# Patient Record
Sex: Male | Born: 1935
Health system: Southern US, Community
[De-identification: ages and names within clinical notes are randomized; demographics above are authoritative.]

## PROBLEM LIST (undated history)

## (undated) DIAGNOSIS — N183 Chronic kidney disease, stage 3 unspecified: Secondary | ICD-10-CM

## (undated) DIAGNOSIS — M545 Low back pain, unspecified: Secondary | ICD-10-CM

## (undated) DIAGNOSIS — C4490 Unspecified malignant neoplasm of skin, unspecified: Secondary | ICD-10-CM

## (undated) DIAGNOSIS — D649 Anemia, unspecified: Secondary | ICD-10-CM

## (undated) DIAGNOSIS — I350 Nonrheumatic aortic (valve) stenosis: Secondary | ICD-10-CM

## (undated) DIAGNOSIS — R51 Headache: Secondary | ICD-10-CM

## (undated) DIAGNOSIS — E669 Obesity, unspecified: Secondary | ICD-10-CM

## (undated) DIAGNOSIS — N184 Chronic kidney disease, stage 4 (severe): Secondary | ICD-10-CM

## (undated) DIAGNOSIS — F419 Anxiety disorder, unspecified: Secondary | ICD-10-CM

## (undated) DIAGNOSIS — R55 Syncope and collapse: Secondary | ICD-10-CM

## (undated) DIAGNOSIS — G8929 Other chronic pain: Secondary | ICD-10-CM

## (undated) DIAGNOSIS — Z9861 Coronary angioplasty status: Secondary | ICD-10-CM

## (undated) DIAGNOSIS — K529 Noninfective gastroenteritis and colitis, unspecified: Secondary | ICD-10-CM

## (undated) DIAGNOSIS — Z7901 Long term (current) use of anticoagulants: Secondary | ICD-10-CM

## (undated) DIAGNOSIS — I480 Paroxysmal atrial fibrillation: Secondary | ICD-10-CM

## (undated) DIAGNOSIS — I2510 Atherosclerotic heart disease of native coronary artery without angina pectoris: Secondary | ICD-10-CM

## (undated) DIAGNOSIS — E785 Hyperlipidemia, unspecified: Secondary | ICD-10-CM

## (undated) DIAGNOSIS — G43909 Migraine, unspecified, not intractable, without status migrainosus: Secondary | ICD-10-CM

## (undated) DIAGNOSIS — I779 Disorder of arteries and arterioles, unspecified: Secondary | ICD-10-CM

## (undated) DIAGNOSIS — I10 Essential (primary) hypertension: Secondary | ICD-10-CM

## (undated) DIAGNOSIS — R0600 Dyspnea, unspecified: Secondary | ICD-10-CM

## (undated) DIAGNOSIS — C61 Malignant neoplasm of prostate: Secondary | ICD-10-CM

## (undated) DIAGNOSIS — I519 Heart disease, unspecified: Secondary | ICD-10-CM

## (undated) DIAGNOSIS — M1990 Unspecified osteoarthritis, unspecified site: Secondary | ICD-10-CM

## (undated) DIAGNOSIS — I739 Peripheral vascular disease, unspecified: Secondary | ICD-10-CM

## (undated) HISTORY — PX: TRANSTHORACIC ECHOCARDIOGRAM: SHX275

## (undated) HISTORY — PX: COLONOSCOPY: SHX174

## (undated) HISTORY — DX: Nonrheumatic aortic (valve) stenosis: I35.0

## (undated) HISTORY — PX: CORONARY ANGIOPLASTY: SHX604

## (undated) HISTORY — PX: APPENDECTOMY: SHX54

## (undated) HISTORY — DX: Chronic kidney disease, stage 4 (severe): N18.4

## (undated) HISTORY — DX: Heart disease, unspecified: I51.9

## (undated) HISTORY — DX: Obesity, unspecified: E66.9

## (undated) HISTORY — DX: Chronic kidney disease, stage 3 (moderate): N18.3

## (undated) HISTORY — PX: KNEE ARTHROSCOPY: SHX127

## (undated) HISTORY — DX: Paroxysmal atrial fibrillation: I48.0

## (undated) HISTORY — DX: Coronary angioplasty status: Z98.61

## (undated) HISTORY — DX: Hyperlipidemia, unspecified: E78.5

## (undated) HISTORY — DX: Essential (primary) hypertension: I10

## (undated) HISTORY — PX: TONSILLECTOMY AND ADENOIDECTOMY: SUR1326

## (undated) HISTORY — DX: Disorder of arteries and arterioles, unspecified: I77.9

## (undated) HISTORY — DX: Chronic kidney disease, stage 3 unspecified: N18.30

## (undated) HISTORY — DX: Atherosclerotic heart disease of native coronary artery without angina pectoris: I25.10

## (undated) HISTORY — DX: Syncope and collapse: R55

## (undated) HISTORY — DX: Peripheral vascular disease, unspecified: I73.9

## (undated) HISTORY — DX: Long term (current) use of anticoagulants: Z79.01

## (undated) HISTORY — PX: CATARACT EXTRACTION W/ INTRAOCULAR LENS  IMPLANT, BILATERAL: SHX1307

---

## 2002-05-22 ENCOUNTER — Encounter: Payer: Self-pay | Admitting: Gastroenterology

## 2003-10-28 ENCOUNTER — Ambulatory Visit (HOSPITAL_COMMUNITY): Admission: RE | Admit: 2003-10-28 | Discharge: 2003-10-28 | Payer: Self-pay | Admitting: Sports Medicine

## 2003-10-31 ENCOUNTER — Ambulatory Visit (HOSPITAL_COMMUNITY): Admission: RE | Admit: 2003-10-31 | Discharge: 2003-10-31 | Payer: Self-pay | Admitting: Family Medicine

## 2006-10-12 ENCOUNTER — Ambulatory Visit (HOSPITAL_COMMUNITY): Admission: RE | Admit: 2006-10-12 | Discharge: 2006-10-12 | Payer: Self-pay | Admitting: Ophthalmology

## 2007-03-14 ENCOUNTER — Ambulatory Visit: Payer: Self-pay | Admitting: Hematology & Oncology

## 2007-03-21 ENCOUNTER — Ambulatory Visit: Payer: Self-pay | Admitting: Surgery

## 2007-04-05 ENCOUNTER — Ambulatory Visit: Admission: RE | Admit: 2007-04-05 | Discharge: 2007-05-10 | Payer: Self-pay | Admitting: Radiation Oncology

## 2007-04-08 LAB — CBC WITH DIFFERENTIAL/PLATELET
BASO%: 0.4 % (ref 0.0–2.0)
Basophils Absolute: 0.1 10*3/uL (ref 0.0–0.1)
EOS%: 1.4 % (ref 0.0–7.0)
Eosinophils Absolute: 0.2 10*3/uL (ref 0.0–0.5)
HCT: 40.5 % (ref 38.7–49.9)
HGB: 14.1 g/dL (ref 13.0–17.1)
LYMPH%: 14.2 % (ref 14.0–48.0)
MCH: 32.2 pg (ref 28.0–33.4)
MCHC: 35 g/dL (ref 32.0–35.9)
MCV: 92 fL (ref 81.6–98.0)
MONO#: 0.8 10*3/uL (ref 0.1–0.9)
MONO%: 6.2 % (ref 0.0–13.0)
NEUT#: 9.8 10*3/uL — ABNORMAL HIGH (ref 1.5–6.5)
NEUT%: 77.8 % — ABNORMAL HIGH (ref 40.0–75.0)
Platelets: 268 10*3/uL (ref 145–400)
RBC: 4.4 10*6/uL (ref 4.20–5.71)
RDW: 12.6 % (ref 11.2–14.6)
WBC: 12.6 10*3/uL — ABNORMAL HIGH (ref 4.0–10.0)
lymph#: 1.8 10*3/uL (ref 0.9–3.3)

## 2007-04-08 LAB — CHCC SMEAR

## 2007-04-11 HISTORY — PX: INSERTION PROSTATE RADIATION SEED: SUR718

## 2007-05-18 ENCOUNTER — Encounter: Admission: RE | Admit: 2007-05-18 | Discharge: 2007-05-18 | Payer: Self-pay | Admitting: Urology

## 2007-05-20 ENCOUNTER — Ambulatory Visit: Admission: RE | Admit: 2007-05-20 | Discharge: 2007-08-09 | Payer: Self-pay | Admitting: Radiation Oncology

## 2007-06-03 ENCOUNTER — Ambulatory Visit (HOSPITAL_BASED_OUTPATIENT_CLINIC_OR_DEPARTMENT_OTHER): Admission: RE | Admit: 2007-06-03 | Discharge: 2007-06-03 | Payer: Self-pay | Admitting: Urology

## 2008-10-18 ENCOUNTER — Encounter: Payer: Self-pay | Admitting: Gastroenterology

## 2008-10-22 DIAGNOSIS — I10 Essential (primary) hypertension: Secondary | ICD-10-CM

## 2008-10-22 DIAGNOSIS — G4730 Sleep apnea, unspecified: Secondary | ICD-10-CM | POA: Insufficient documentation

## 2008-10-22 DIAGNOSIS — M129 Arthropathy, unspecified: Secondary | ICD-10-CM | POA: Insufficient documentation

## 2008-10-22 DIAGNOSIS — Z86010 Personal history of colonic polyps: Secondary | ICD-10-CM

## 2008-10-22 DIAGNOSIS — Z8546 Personal history of malignant neoplasm of prostate: Secondary | ICD-10-CM | POA: Insufficient documentation

## 2008-10-22 DIAGNOSIS — K649 Unspecified hemorrhoids: Secondary | ICD-10-CM | POA: Insufficient documentation

## 2008-10-22 DIAGNOSIS — G4719 Other hypersomnia: Secondary | ICD-10-CM | POA: Insufficient documentation

## 2008-10-22 DIAGNOSIS — R3915 Urgency of urination: Secondary | ICD-10-CM

## 2008-10-22 HISTORY — DX: Essential (primary) hypertension: I10

## 2008-10-23 ENCOUNTER — Ambulatory Visit: Payer: Self-pay | Admitting: Gastroenterology

## 2008-10-23 DIAGNOSIS — D649 Anemia, unspecified: Secondary | ICD-10-CM

## 2008-10-23 DIAGNOSIS — D539 Nutritional anemia, unspecified: Secondary | ICD-10-CM | POA: Insufficient documentation

## 2008-10-23 DIAGNOSIS — H409 Unspecified glaucoma: Secondary | ICD-10-CM | POA: Insufficient documentation

## 2008-10-24 LAB — CONVERTED CEMR LAB
ALT: 21 units/L (ref 0–53)
AST: 28 units/L (ref 0–37)
Albumin: 3.6 g/dL (ref 3.5–5.2)
Alkaline Phosphatase: 55 units/L (ref 39–117)
BUN: 30 mg/dL — ABNORMAL HIGH (ref 6–23)
Basophils Absolute: 0 10*3/uL (ref 0.0–0.1)
Basophils Relative: 0 % (ref 0.0–3.0)
Bilirubin, Direct: 0 mg/dL (ref 0.0–0.3)
CO2: 31 meq/L (ref 19–32)
Calcium: 9.1 mg/dL (ref 8.4–10.5)
Chloride: 105 meq/L (ref 96–112)
Creatinine, Ser: 1.9 mg/dL — ABNORMAL HIGH (ref 0.4–1.5)
Eosinophils Absolute: 0.2 10*3/uL (ref 0.0–0.7)
Eosinophils Relative: 2.1 % (ref 0.0–5.0)
Ferritin: 160 ng/mL (ref 22.0–322.0)
Folate: >20 ng/mL
GFR calc non Af Amer: 36.79 mL/min (ref >60)
Glucose, Bld: 117 mg/dL — ABNORMAL HIGH (ref 70–99)
HCT: 40.1 % (ref 39.0–52.0)
Hemoglobin: 13.6 g/dL (ref 13.0–17.0)
Iron: 86 ug/dL (ref 42–165)
Lymphocytes Relative: 13.5 % (ref 12.0–46.0)
Lymphs Abs: 1.5 10*3/uL (ref 0.7–4.0)
MCHC: 33.8 g/dL (ref 30.0–36.0)
MCV: 94.3 fL (ref 78.0–100.0)
Monocytes Absolute: 0.6 10*3/uL (ref 0.1–1.0)
Monocytes Relative: 5.8 % (ref 3.0–12.0)
Neutro Abs: 8.7 10*3/uL — ABNORMAL HIGH (ref 1.4–7.7)
Neutrophils Relative %: 78.6 % — ABNORMAL HIGH (ref 43.0–77.0)
Platelets: 212 10*3/uL (ref 150.0–400.0)
Potassium: 4.9 meq/L (ref 3.5–5.1)
RBC: 4.25 M/uL (ref 4.22–5.81)
RDW: 12.2 % (ref 11.5–14.6)
Saturation Ratios: 29.7 % (ref 20.0–50.0)
Sodium: 142 meq/L (ref 135–145)
TSH: 2.19 microintl units/mL (ref 0.35–5.50)
Total Bilirubin: 0.9 mg/dL (ref 0.3–1.2)
Total Protein: 7 g/dL (ref 6.0–8.3)
Transferrin: 207.1 mg/dL — ABNORMAL LOW (ref 212.0–360.0)
Vitamin B-12: 526 pg/mL (ref 211–911)
WBC: 11 10*3/uL — ABNORMAL HIGH (ref 4.5–10.5)

## 2008-10-29 ENCOUNTER — Ambulatory Visit: Payer: Self-pay | Admitting: Gastroenterology

## 2009-04-23 ENCOUNTER — Inpatient Hospital Stay (HOSPITAL_COMMUNITY): Admission: RE | Admit: 2009-04-23 | Discharge: 2009-05-01 | Payer: Self-pay | Admitting: Specialist

## 2009-04-23 ENCOUNTER — Ambulatory Visit: Payer: Self-pay | Admitting: Internal Medicine

## 2009-05-03 ENCOUNTER — Emergency Department (HOSPITAL_COMMUNITY): Admission: EM | Admit: 2009-05-03 | Discharge: 2009-05-03 | Payer: Self-pay | Admitting: Emergency Medicine

## 2009-05-08 ENCOUNTER — Telehealth: Payer: Self-pay | Admitting: Gastroenterology

## 2009-05-09 ENCOUNTER — Ambulatory Visit: Payer: Self-pay | Admitting: Gastroenterology

## 2009-05-09 ENCOUNTER — Encounter: Payer: Self-pay | Admitting: Physician Assistant

## 2009-05-09 DIAGNOSIS — R197 Diarrhea, unspecified: Secondary | ICD-10-CM

## 2009-05-09 LAB — CONVERTED CEMR LAB
BUN: 20 mg/dL (ref 6–23)
Basophils Absolute: 0 10*3/uL (ref 0.0–0.1)
Basophils Relative: 0 % (ref 0.0–3.0)
CO2: 32 meq/L (ref 19–32)
Calcium: 8.8 mg/dL (ref 8.4–10.5)
Chloride: 103 meq/L (ref 96–112)
Creatinine, Ser: 1.5 mg/dL (ref 0.4–1.5)
Eosinophils Absolute: 0.3 10*3/uL (ref 0.0–0.7)
Eosinophils Relative: 2 % (ref 0.0–5.0)
GFR calc non Af Amer: 48.26 mL/min (ref >60)
Glucose, Bld: 98 mg/dL (ref 70–99)
HCT: 30.1 % — ABNORMAL LOW (ref 39.0–52.0)
Hemoglobin: 10.3 g/dL — ABNORMAL LOW (ref 13.0–17.0)
Lymphocytes Relative: 9.1 % — ABNORMAL LOW (ref 12.0–46.0)
Lymphs Abs: 1.3 10*3/uL (ref 0.7–4.0)
MCHC: 34.4 g/dL (ref 30.0–36.0)
MCV: 96.4 fL (ref 78.0–100.0)
Monocytes Absolute: 1.3 10*3/uL — ABNORMAL HIGH (ref 0.1–1.0)
Monocytes Relative: 9 % (ref 3.0–12.0)
Neutro Abs: 11.6 10*3/uL — ABNORMAL HIGH (ref 1.4–7.7)
Neutrophils Relative %: 79.9 % — ABNORMAL HIGH (ref 43.0–77.0)
Platelets: 458 10*3/uL — ABNORMAL HIGH (ref 150.0–400.0)
Potassium: 4.2 meq/L (ref 3.5–5.1)
RBC: 3.12 M/uL — ABNORMAL LOW (ref 4.22–5.81)
RDW: 12.5 % (ref 11.5–14.6)
Sodium: 141 meq/L (ref 135–145)
WBC: 14.5 10*3/uL — ABNORMAL HIGH (ref 4.5–10.5)

## 2009-05-10 HISTORY — PX: TOTAL KNEE ARTHROPLASTY: SHX125

## 2009-05-13 ENCOUNTER — Telehealth: Payer: Self-pay | Admitting: Physician Assistant

## 2009-05-17 ENCOUNTER — Encounter: Admission: RE | Admit: 2009-05-17 | Discharge: 2009-05-17 | Payer: Self-pay | Admitting: Family Medicine

## 2009-05-21 ENCOUNTER — Ambulatory Visit: Payer: Self-pay | Admitting: Gastroenterology

## 2009-05-21 DIAGNOSIS — R160 Hepatomegaly, not elsewhere classified: Secondary | ICD-10-CM

## 2009-05-21 DIAGNOSIS — R609 Edema, unspecified: Secondary | ICD-10-CM

## 2009-05-21 DIAGNOSIS — R197 Diarrhea, unspecified: Secondary | ICD-10-CM

## 2009-05-22 ENCOUNTER — Telehealth (INDEPENDENT_AMBULATORY_CARE_PROVIDER_SITE_OTHER): Payer: Self-pay | Admitting: *Deleted

## 2009-05-22 LAB — CONVERTED CEMR LAB
ALT: 30 units/L (ref 0–53)
AST: 30 units/L (ref 0–37)
Albumin: 3.7 g/dL (ref 3.5–5.2)
Alkaline Phosphatase: 61 units/L (ref 39–117)
Ammonia: 28 umol/L (ref 11–35)
BUN: 31 mg/dL — ABNORMAL HIGH (ref 6–23)
Basophils Absolute: 0 10*3/uL (ref 0.0–0.1)
Basophils Relative: 0 % (ref 0.0–3.0)
Bilirubin Urine: NEGATIVE
Bilirubin, Direct: 0.1 mg/dL (ref 0.0–0.3)
CO2: 25 meq/L (ref 19–32)
Calcium: 9.6 mg/dL (ref 8.4–10.5)
Chloride: 103 meq/L (ref 96–112)
Creatinine, Ser: 2 mg/dL — ABNORMAL HIGH (ref 0.4–1.5)
Eosinophils Absolute: 0.2 10*3/uL (ref 0.0–0.7)
Eosinophils Relative: 1.3 % (ref 0.0–5.0)
Ferritin: 203.4 ng/mL (ref 22.0–322.0)
Folate: >20 ng/mL
GFR calc non Af Amer: 34.63 mL/min (ref >60)
Glucose, Bld: 109 mg/dL — ABNORMAL HIGH (ref 70–99)
HCT: 35.6 % — ABNORMAL LOW (ref 39.0–52.0)
Hemoglobin, Urine: NEGATIVE
Hemoglobin: 12 g/dL — ABNORMAL LOW (ref 13.0–17.0)
INR: 1 (ref 0.8–1.0)
Iron: 56 ug/dL (ref 42–165)
Lymphocytes Relative: 11.9 % — ABNORMAL LOW (ref 12.0–46.0)
Lymphs Abs: 1.6 10*3/uL (ref 0.7–4.0)
MCHC: 33.8 g/dL (ref 30.0–36.0)
MCV: 96.3 fL (ref 78.0–100.0)
Monocytes Absolute: 1.1 10*3/uL — ABNORMAL HIGH (ref 0.1–1.0)
Monocytes Relative: 7.8 % (ref 3.0–12.0)
Neutro Abs: 10.6 10*3/uL — ABNORMAL HIGH (ref 1.4–7.7)
Neutrophils Relative %: 79 % — ABNORMAL HIGH (ref 43.0–77.0)
Nitrite: NEGATIVE
Platelets: 370 10*3/uL (ref 150.0–400.0)
Potassium: 5.7 meq/L — ABNORMAL HIGH (ref 3.5–5.1)
Prothrombin Time: 10.4 s (ref 9.1–11.7)
RBC: 3.69 M/uL — ABNORMAL LOW (ref 4.22–5.81)
RDW: 13.5 % (ref 11.5–14.6)
Saturation Ratios: 22.4 % (ref 20.0–50.0)
Sed Rate: 49 mm/hr — ABNORMAL HIGH (ref 0–22)
Sodium: 141 meq/L (ref 135–145)
Specific Gravity, Urine: 1.02 (ref 1.000–1.030)
TSH: 2.9 microintl units/mL (ref 0.35–5.50)
Total Bilirubin: 0.6 mg/dL (ref 0.3–1.2)
Total Protein, Urine: NEGATIVE mg/dL
Total Protein: 7.5 g/dL (ref 6.0–8.3)
Transferrin: 178.5 mg/dL — ABNORMAL LOW (ref 212.0–360.0)
Urine Glucose: NEGATIVE mg/dL
Urobilinogen, UA: 0.2 (ref 0.0–1.0)
Vitamin B-12: 653 pg/mL (ref 211–911)
WBC: 13.5 10*3/uL — ABNORMAL HIGH (ref 4.5–10.5)
pH: 5.5 (ref 5.0–8.0)

## 2009-05-23 ENCOUNTER — Telehealth (INDEPENDENT_AMBULATORY_CARE_PROVIDER_SITE_OTHER): Payer: Self-pay | Admitting: *Deleted

## 2009-05-23 ENCOUNTER — Ambulatory Visit: Payer: Self-pay | Admitting: Cardiology

## 2009-05-24 ENCOUNTER — Ambulatory Visit: Payer: Self-pay | Admitting: Gastroenterology

## 2009-05-24 LAB — CONVERTED CEMR LAB
BUN: 26 mg/dL — ABNORMAL HIGH (ref 6–23)
CO2: 28 meq/L (ref 19–32)
Calcium: 8.8 mg/dL (ref 8.4–10.5)
Chloride: 101 meq/L (ref 96–112)
Creatinine, Ser: 1.8 mg/dL — ABNORMAL HIGH (ref 0.4–1.5)
GFR calc non Af Amer: 39.1 mL/min (ref >60)
Glucose, Bld: 106 mg/dL — ABNORMAL HIGH (ref 70–99)
Potassium: 5.2 meq/L — ABNORMAL HIGH (ref 3.5–5.1)
Sodium: 137 meq/L (ref 135–145)

## 2009-06-06 ENCOUNTER — Encounter: Admission: RE | Admit: 2009-06-06 | Discharge: 2009-06-06 | Payer: Self-pay | Admitting: Emergency Medicine

## 2010-06-28 IMAGING — CR DG ABDOMEN ACUTE W/ 1V CHEST
4 series · 4 of 4 positions shown · non-contrast
Comparison: Abdominal films from 04/28/2009.

CLINICAL DATA: Shortness of breath and diarrhea.

ACUTE ABDOMEN SERIES (ABDOMEN 2 VIEW & CHEST 1 VIEW)

[w chest pa]
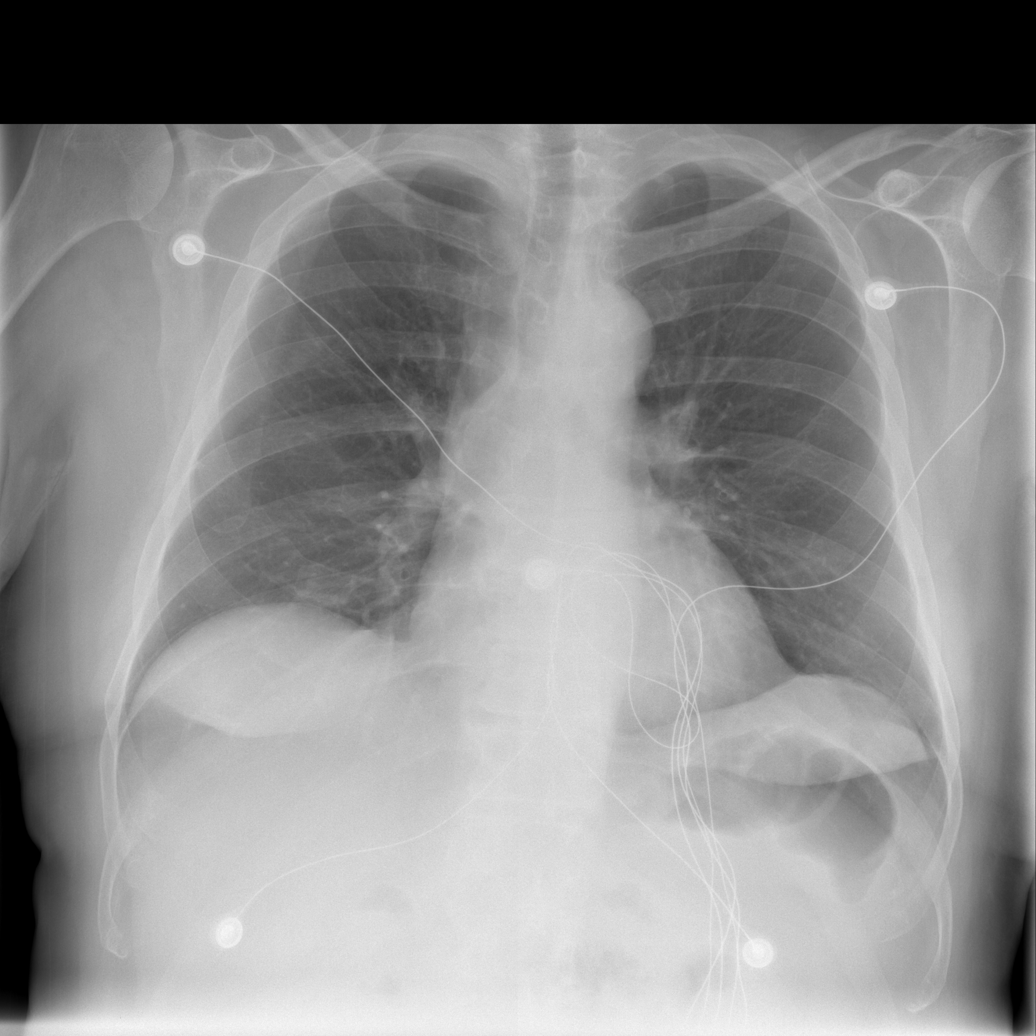

[w abdomen upright *]
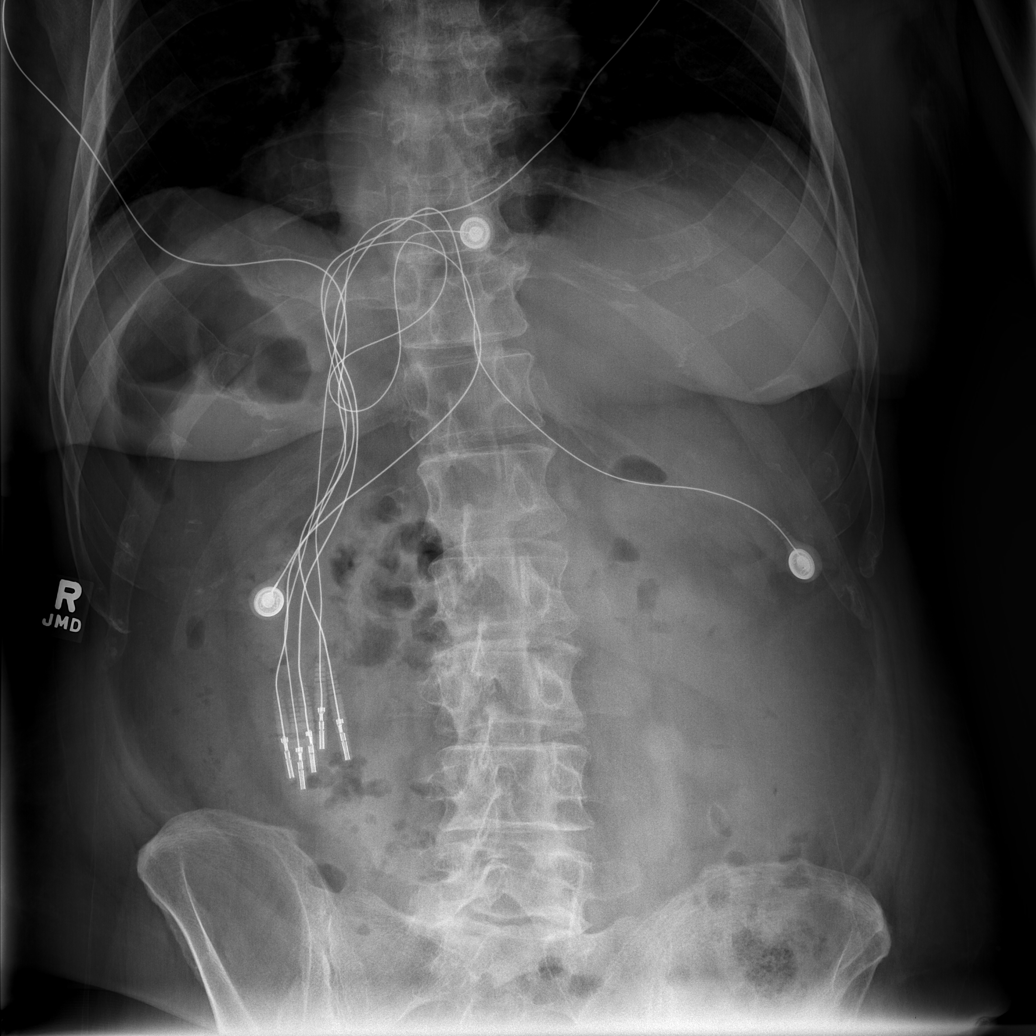

[t abdomen supine (1 of 2)]
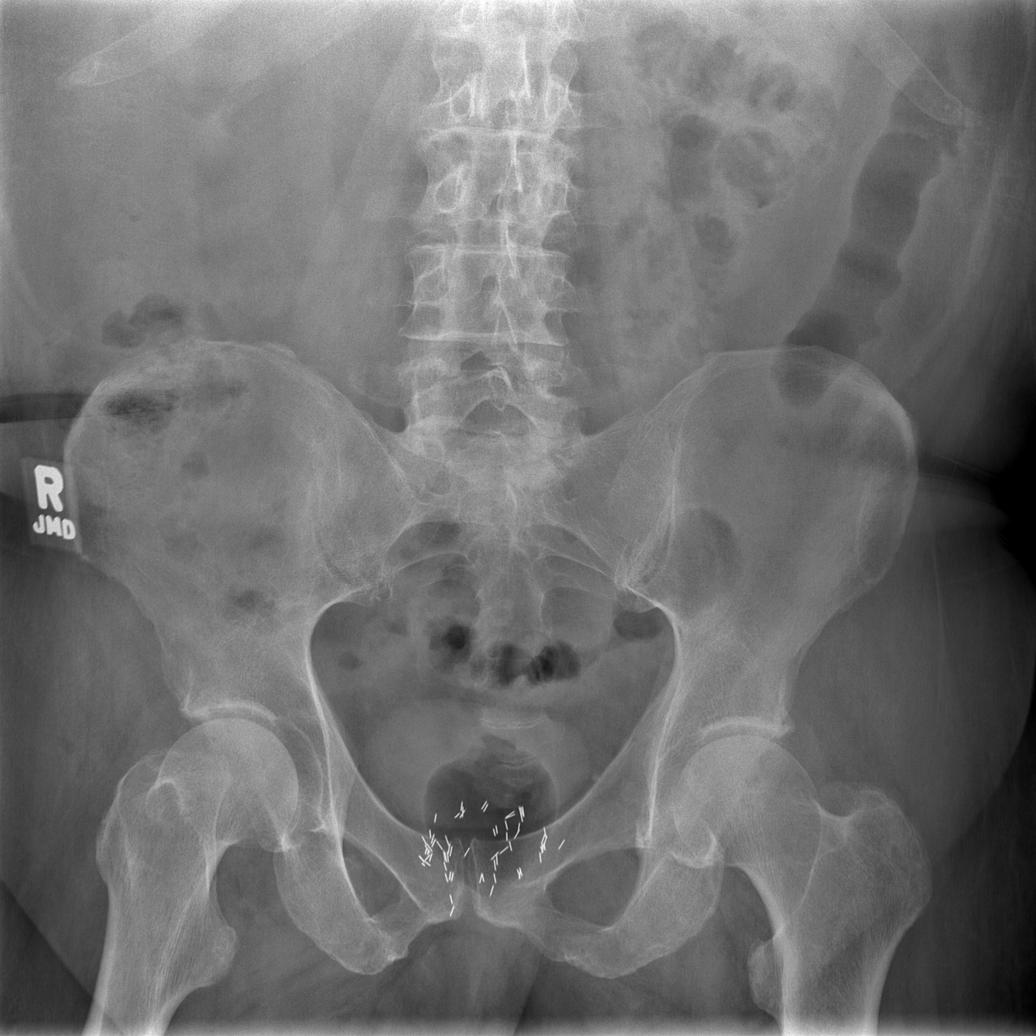

[t abdomen supine (2 of 2)]
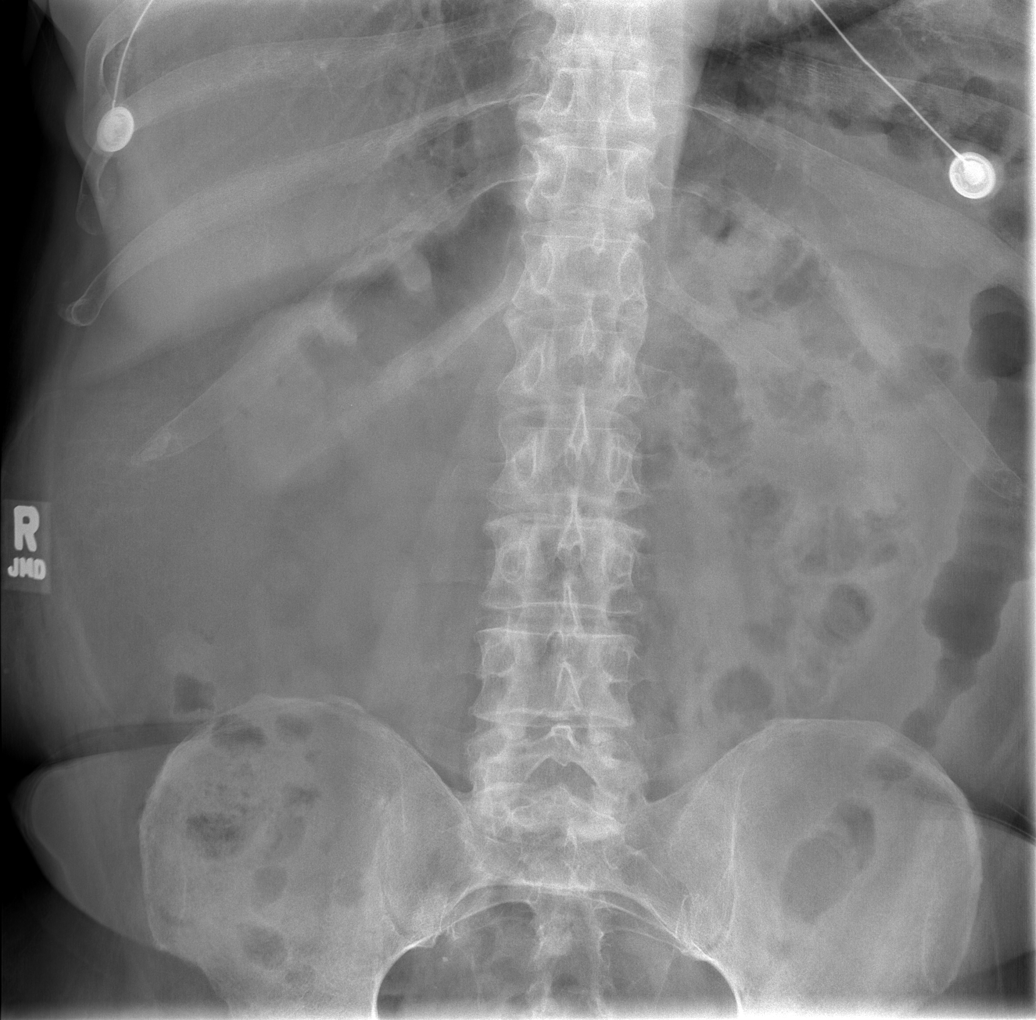

[4 of 4 positions shown; findings below may reference images not displayed]

FINDINGS: The lungs are clear without focal consolidation, edema,
effusion or pneumothorax.  Cardiopericardial silhouette is within
normal limits for size.  Imaged bony structures of the thorax are
intact.

Upright abdomen is without evidence of intraperitoneal free air.
There is no gaseous bowel dilatation to suggest obstruction.  There
is some air in the proximal transverse colon and descending colon.
Both these segments of colon appeared featureless, suggesting bowel
wall thickening.

Brachytherapy seeds project over the prostate gland.
IMPRESSION: No acute cardiopulmonary process.

Colonic wall thickening in the transverse and descending colon.
Infectious or inflammatory colitis could have this appearance.

## 2010-08-28 ENCOUNTER — Encounter: Payer: Self-pay | Admitting: Emergency Medicine

## 2010-08-28 ENCOUNTER — Ambulatory Visit (HOSPITAL_COMMUNITY)
Admission: RE | Admit: 2010-08-28 | Discharge: 2010-08-28 | Payer: Medicare Other | Source: Home / Self Care | Attending: Emergency Medicine | Admitting: Emergency Medicine

## 2010-08-28 DIAGNOSIS — I739 Peripheral vascular disease, unspecified: Secondary | ICD-10-CM

## 2010-08-28 DIAGNOSIS — I6529 Occlusion and stenosis of unspecified carotid artery: Secondary | ICD-10-CM

## 2010-08-31 ENCOUNTER — Encounter: Payer: Self-pay | Admitting: Family Medicine

## 2010-11-14 LAB — URINALYSIS, MICROSCOPIC ONLY
Bilirubin Urine: NEGATIVE
Glucose, UA: NEGATIVE mg/dL
Ketones, ur: NEGATIVE mg/dL
Leukocytes, UA: NEGATIVE
Nitrite: NEGATIVE
Protein, ur: NEGATIVE mg/dL
Specific Gravity, Urine: 1.02 (ref 1.005–1.030)
Urobilinogen, UA: 0.2 mg/dL (ref 0.0–1.0)
pH: 6 (ref 5.0–8.0)

## 2010-11-14 LAB — COMPREHENSIVE METABOLIC PANEL
ALT: 53 U/L (ref 0–53)
ALT: 19 U/L (ref 0–53)
AST: 57 U/L — ABNORMAL HIGH (ref 0–37)
AST: 25 U/L (ref 0–37)
Albumin: 2.6 g/dL — ABNORMAL LOW (ref 3.5–5.2)
Albumin: 3.5 g/dL (ref 3.5–5.2)
Alkaline Phosphatase: 56 U/L (ref 39–117)
Alkaline Phosphatase: 50 U/L (ref 39–117)
BUN: 20 mg/dL (ref 6–23)
BUN: 29 mg/dL — ABNORMAL HIGH (ref 6–23)
CO2: 28 mEq/L (ref 19–32)
CO2: 30 mEq/L (ref 19–32)
Calcium: 8.6 mg/dL (ref 8.4–10.5)
Calcium: 9.5 mg/dL (ref 8.4–10.5)
Chloride: 106 mEq/L (ref 96–112)
Chloride: 106 mEq/L (ref 96–112)
Creatinine, Ser: 1.41 mg/dL (ref 0.4–1.5)
Creatinine, Ser: 1.8 mg/dL — ABNORMAL HIGH (ref 0.4–1.5)
GFR calc Af Amer: 59 mL/min — ABNORMAL LOW (ref >60)
GFR calc Af Amer: 45 mL/min — ABNORMAL LOW (ref >60)
GFR calc non Af Amer: 49 mL/min — ABNORMAL LOW (ref >60)
GFR calc non Af Amer: 37 mL/min — ABNORMAL LOW (ref >60)
Glucose, Bld: 100 mg/dL — ABNORMAL HIGH (ref 70–99)
Glucose, Bld: 87 mg/dL (ref 70–99)
Potassium: 2.9 mEq/L — ABNORMAL LOW (ref 3.5–5.1)
Potassium: 5.2 mEq/L — ABNORMAL HIGH (ref 3.5–5.1)
Sodium: 142 mEq/L (ref 135–145)
Sodium: 141 mEq/L (ref 135–145)
Total Bilirubin: 0.3 mg/dL (ref 0.3–1.2)
Total Bilirubin: 0.5 mg/dL (ref 0.3–1.2)
Total Protein: 6 g/dL (ref 6.0–8.3)
Total Protein: 6.9 g/dL (ref 6.0–8.3)

## 2010-11-14 LAB — CBC
HCT: 30.6 % — ABNORMAL LOW (ref 39.0–52.0)
HCT: 31.2 % — ABNORMAL LOW (ref 39.0–52.0)
HCT: 31.8 % — ABNORMAL LOW (ref 39.0–52.0)
HCT: 32.2 % — ABNORMAL LOW (ref 39.0–52.0)
HCT: 32.5 % — ABNORMAL LOW (ref 39.0–52.0)
HCT: 33 % — ABNORMAL LOW (ref 39.0–52.0)
HCT: 34.2 % — ABNORMAL LOW (ref 39.0–52.0)
HCT: 33.7 % — ABNORMAL LOW (ref 39.0–52.0)
HCT: 40.3 % (ref 39.0–52.0)
Hemoglobin: 10.6 g/dL — ABNORMAL LOW (ref 13.0–17.0)
Hemoglobin: 10.6 g/dL — ABNORMAL LOW (ref 13.0–17.0)
Hemoglobin: 10.8 g/dL — ABNORMAL LOW (ref 13.0–17.0)
Hemoglobin: 11 g/dL — ABNORMAL LOW (ref 13.0–17.0)
Hemoglobin: 11.1 g/dL — ABNORMAL LOW (ref 13.0–17.0)
Hemoglobin: 11.4 g/dL — ABNORMAL LOW (ref 13.0–17.0)
Hemoglobin: 11.6 g/dL — ABNORMAL LOW (ref 13.0–17.0)
Hemoglobin: 11.6 g/dL — ABNORMAL LOW (ref 13.0–17.0)
Hemoglobin: 13.4 g/dL (ref 13.0–17.0)
MCHC: 33.5 g/dL (ref 30.0–36.0)
MCHC: 34 g/dL (ref 30.0–36.0)
MCHC: 34 g/dL (ref 30.0–36.0)
MCHC: 34 g/dL (ref 30.0–36.0)
MCHC: 34.5 g/dL (ref 30.0–36.0)
MCHC: 34.5 g/dL (ref 30.0–36.0)
MCHC: 34.7 g/dL (ref 30.0–36.0)
MCHC: 33.3 g/dL (ref 30.0–36.0)
MCHC: 34.4 g/dL (ref 30.0–36.0)
MCV: 95.8 fL (ref 78.0–100.0)
MCV: 96 fL (ref 78.0–100.0)
MCV: 96 fL (ref 78.0–100.0)
MCV: 96.1 fL (ref 78.0–100.0)
MCV: 96.1 fL (ref 78.0–100.0)
MCV: 96.4 fL (ref 78.0–100.0)
MCV: 97.3 fL (ref 78.0–100.0)
MCV: 96.5 fL (ref 78.0–100.0)
MCV: 96.8 fL (ref 78.0–100.0)
Platelets: 170 10*3/uL (ref 150–400)
Platelets: 180 10*3/uL (ref 150–400)
Platelets: 210 10*3/uL (ref 150–400)
Platelets: 212 10*3/uL (ref 150–400)
Platelets: 272 10*3/uL (ref 150–400)
Platelets: 284 10*3/uL (ref 150–400)
Platelets: 452 10*3/uL — ABNORMAL HIGH (ref 150–400)
Platelets: 201 10*3/uL (ref 150–400)
Platelets: 222 10*3/uL (ref 150–400)
RBC: 3.19 MIL/uL — ABNORMAL LOW (ref 4.22–5.81)
RBC: 3.24 MIL/uL — ABNORMAL LOW (ref 4.22–5.81)
RBC: 3.3 MIL/uL — ABNORMAL LOW (ref 4.22–5.81)
RBC: 3.36 MIL/uL — ABNORMAL LOW (ref 4.22–5.81)
RBC: 3.38 MIL/uL — ABNORMAL LOW (ref 4.22–5.81)
RBC: 3.44 MIL/uL — ABNORMAL LOW (ref 4.22–5.81)
RBC: 3.52 MIL/uL — ABNORMAL LOW (ref 4.22–5.81)
RBC: 3.49 MIL/uL — ABNORMAL LOW (ref 4.22–5.81)
RBC: 4.16 MIL/uL — ABNORMAL LOW (ref 4.22–5.81)
RDW: 11.9 % (ref 11.5–15.5)
RDW: 12.5 % (ref 11.5–15.5)
RDW: 12.8 % (ref 11.5–15.5)
RDW: 12.8 % (ref 11.5–15.5)
RDW: 12.9 % (ref 11.5–15.5)
RDW: 13.1 % (ref 11.5–15.5)
RDW: 13.1 % (ref 11.5–15.5)
RDW: 12.8 % (ref 11.5–15.5)
RDW: 13.1 % (ref 11.5–15.5)
WBC: 12.2 10*3/uL — ABNORMAL HIGH (ref 4.0–10.5)
WBC: 12.6 10*3/uL — ABNORMAL HIGH (ref 4.0–10.5)
WBC: 12.9 10*3/uL — ABNORMAL HIGH (ref 4.0–10.5)
WBC: 13.1 10*3/uL — ABNORMAL HIGH (ref 4.0–10.5)
WBC: 15.2 10*3/uL — ABNORMAL HIGH (ref 4.0–10.5)
WBC: 16 10*3/uL — ABNORMAL HIGH (ref 4.0–10.5)
WBC: 17.6 10*3/uL — ABNORMAL HIGH (ref 4.0–10.5)
WBC: 11.1 10*3/uL — ABNORMAL HIGH (ref 4.0–10.5)
WBC: 14.9 10*3/uL — ABNORMAL HIGH (ref 4.0–10.5)

## 2010-11-14 LAB — CROSSMATCH
ABO/RH(D): O POS
Antibody Screen: NEGATIVE

## 2010-11-14 LAB — DIFFERENTIAL
Basophils Absolute: 0.2 10*3/uL — ABNORMAL HIGH (ref 0.0–0.1)
Basophils Absolute: 0.2 10*3/uL — ABNORMAL HIGH (ref 0.0–0.1)
Basophils Absolute: 0 10*3/uL (ref 0.0–0.1)
Basophils Relative: 1 % (ref 0–1)
Basophils Relative: 2 % — ABNORMAL HIGH (ref 0–1)
Basophils Relative: 0 % (ref 0–1)
Eosinophils Absolute: 0.2 10*3/uL (ref 0.0–0.7)
Eosinophils Absolute: 0.3 10*3/uL (ref 0.0–0.7)
Eosinophils Absolute: 0.2 10*3/uL (ref 0.0–0.7)
Eosinophils Relative: 1 % (ref 0–5)
Eosinophils Relative: 2 % (ref 0–5)
Eosinophils Relative: 2 % (ref 0–5)
Lymphocytes Relative: 12 % (ref 12–46)
Lymphocytes Relative: 7 % — ABNORMAL LOW (ref 12–46)
Lymphocytes Relative: 15 % (ref 12–46)
Lymphs Abs: 1.1 10*3/uL (ref 0.7–4.0)
Lymphs Abs: 1.5 10*3/uL (ref 0.7–4.0)
Lymphs Abs: 1.7 10*3/uL (ref 0.7–4.0)
Monocytes Absolute: 1 10*3/uL (ref 0.1–1.0)
Monocytes Absolute: 1.2 10*3/uL — ABNORMAL HIGH (ref 0.1–1.0)
Monocytes Absolute: 0.9 10*3/uL (ref 0.1–1.0)
Monocytes Relative: 8 % (ref 3–12)
Monocytes Relative: 8 % (ref 3–12)
Monocytes Relative: 8 % (ref 3–12)
Neutro Abs: 12.5 10*3/uL — ABNORMAL HIGH (ref 1.7–7.7)
Neutro Abs: 9.9 10*3/uL — ABNORMAL HIGH (ref 1.7–7.7)
Neutro Abs: 8.4 10*3/uL — ABNORMAL HIGH (ref 1.7–7.7)
Neutrophils Relative %: 77 % (ref 43–77)
Neutrophils Relative %: 83 % — ABNORMAL HIGH (ref 43–77)
Neutrophils Relative %: 76 % (ref 43–77)

## 2010-11-14 LAB — RENAL FUNCTION PANEL
Albumin: 2.2 g/dL — ABNORMAL LOW (ref 3.5–5.2)
Albumin: 2.3 g/dL — ABNORMAL LOW (ref 3.5–5.2)
Albumin: 2.3 g/dL — ABNORMAL LOW (ref 3.5–5.2)
BUN: 17 mg/dL (ref 6–23)
BUN: 17 mg/dL (ref 6–23)
BUN: 19 mg/dL (ref 6–23)
CO2: 23 mEq/L (ref 19–32)
CO2: 25 mEq/L (ref 19–32)
CO2: 26 mEq/L (ref 19–32)
Calcium: 7.9 mg/dL — ABNORMAL LOW (ref 8.4–10.5)
Calcium: 8 mg/dL — ABNORMAL LOW (ref 8.4–10.5)
Calcium: 8.1 mg/dL — ABNORMAL LOW (ref 8.4–10.5)
Chloride: 100 mEq/L (ref 96–112)
Chloride: 103 mEq/L (ref 96–112)
Chloride: 99 mEq/L (ref 96–112)
Creatinine, Ser: 1.3 mg/dL (ref 0.4–1.5)
Creatinine, Ser: 1.38 mg/dL (ref 0.4–1.5)
Creatinine, Ser: 1.39 mg/dL (ref 0.4–1.5)
GFR calc Af Amer: >60 mL/min (ref >60)
GFR calc Af Amer: >60 mL/min (ref >60)
GFR calc Af Amer: >60 mL/min (ref >60)
GFR calc non Af Amer: 50 mL/min — ABNORMAL LOW (ref >60)
GFR calc non Af Amer: 50 mL/min — ABNORMAL LOW (ref >60)
GFR calc non Af Amer: 54 mL/min — ABNORMAL LOW (ref >60)
Glucose, Bld: 110 mg/dL — ABNORMAL HIGH (ref 70–99)
Glucose, Bld: 124 mg/dL — ABNORMAL HIGH (ref 70–99)
Glucose, Bld: 137 mg/dL — ABNORMAL HIGH (ref 70–99)
Phosphorus: 2.4 mg/dL (ref 2.3–4.6)
Phosphorus: 2.7 mg/dL (ref 2.3–4.6)
Phosphorus: 2.9 mg/dL (ref 2.3–4.6)
Potassium: 3.1 mEq/L — ABNORMAL LOW (ref 3.5–5.1)
Potassium: 3.2 mEq/L — ABNORMAL LOW (ref 3.5–5.1)
Potassium: 3.6 mEq/L (ref 3.5–5.1)
Sodium: 131 mEq/L — ABNORMAL LOW (ref 135–145)
Sodium: 131 mEq/L — ABNORMAL LOW (ref 135–145)
Sodium: 133 mEq/L — ABNORMAL LOW (ref 135–145)

## 2010-11-14 LAB — URINALYSIS, ROUTINE W REFLEX MICROSCOPIC
Bilirubin Urine: NEGATIVE
Bilirubin Urine: NEGATIVE
Bilirubin Urine: NEGATIVE
Glucose, UA: NEGATIVE mg/dL
Glucose, UA: NEGATIVE mg/dL
Glucose, UA: NEGATIVE mg/dL
Hgb urine dipstick: NEGATIVE
Hgb urine dipstick: NEGATIVE
Hgb urine dipstick: NEGATIVE
Ketones, ur: NEGATIVE mg/dL
Ketones, ur: NEGATIVE mg/dL
Ketones, ur: NEGATIVE mg/dL
Nitrite: NEGATIVE
Nitrite: NEGATIVE
Nitrite: NEGATIVE
Protein, ur: NEGATIVE mg/dL
Protein, ur: NEGATIVE mg/dL
Protein, ur: NEGATIVE mg/dL
Specific Gravity, Urine: 1.014 (ref 1.005–1.030)
Specific Gravity, Urine: 1.018 (ref 1.005–1.030)
Specific Gravity, Urine: 1.018 (ref 1.005–1.030)
Urobilinogen, UA: 0.2 mg/dL (ref 0.0–1.0)
Urobilinogen, UA: 0.2 mg/dL (ref 0.0–1.0)
Urobilinogen, UA: 0.2 mg/dL (ref 0.0–1.0)
pH: 5.5 (ref 5.0–8.0)
pH: 6 (ref 5.0–8.0)
pH: 6.5 (ref 5.0–8.0)

## 2010-11-14 LAB — BASIC METABOLIC PANEL
BUN: 16 mg/dL (ref 6–23)
BUN: 18 mg/dL (ref 6–23)
BUN: 24 mg/dL — ABNORMAL HIGH (ref 6–23)
CO2: 24 mEq/L (ref 19–32)
CO2: 26 mEq/L (ref 19–32)
CO2: 28 mEq/L (ref 19–32)
Calcium: 7.9 mg/dL — ABNORMAL LOW (ref 8.4–10.5)
Calcium: 7.9 mg/dL — ABNORMAL LOW (ref 8.4–10.5)
Calcium: 8.1 mg/dL — ABNORMAL LOW (ref 8.4–10.5)
Chloride: 103 mEq/L (ref 96–112)
Chloride: 105 mEq/L (ref 96–112)
Chloride: 105 mEq/L (ref 96–112)
Creatinine, Ser: 1.23 mg/dL (ref 0.4–1.5)
Creatinine, Ser: 1.53 mg/dL — ABNORMAL HIGH (ref 0.4–1.5)
Creatinine, Ser: 1.75 mg/dL — ABNORMAL HIGH (ref 0.4–1.5)
GFR calc Af Amer: 54 mL/min — ABNORMAL LOW (ref >60)
GFR calc Af Amer: >60 mL/min (ref >60)
GFR calc Af Amer: 46 mL/min — ABNORMAL LOW (ref >60)
GFR calc non Af Amer: 44 mL/min — ABNORMAL LOW (ref >60)
GFR calc non Af Amer: 57 mL/min — ABNORMAL LOW (ref >60)
GFR calc non Af Amer: 38 mL/min — ABNORMAL LOW (ref >60)
Glucose, Bld: 109 mg/dL — ABNORMAL HIGH (ref 70–99)
Glucose, Bld: 166 mg/dL — ABNORMAL HIGH (ref 70–99)
Glucose, Bld: 134 mg/dL — ABNORMAL HIGH (ref 70–99)
Potassium: 3.2 mEq/L — ABNORMAL LOW (ref 3.5–5.1)
Potassium: 3.9 mEq/L (ref 3.5–5.1)
Potassium: 4.6 mEq/L (ref 3.5–5.1)
Sodium: 134 mEq/L — ABNORMAL LOW (ref 135–145)
Sodium: 134 mEq/L — ABNORMAL LOW (ref 135–145)
Sodium: 137 mEq/L (ref 135–145)

## 2010-11-14 LAB — ABO/RH: ABO/RH(D): O POS

## 2010-11-14 LAB — URINE CULTURE
Colony Count: 3000
Colony Count: NO GROWTH
Culture: NO GROWTH
Special Requests: NEGATIVE

## 2010-11-14 LAB — STOOL CULTURE

## 2010-11-14 LAB — HEMOCCULT GUIAC POC 1CARD (OFFICE): Fecal Occult Bld: NEGATIVE

## 2010-11-14 LAB — CLOSTRIDIUM DIFFICILE EIA: C difficile Toxins A+B, EIA: NEGATIVE

## 2010-11-14 LAB — OVA AND PARASITE EXAMINATION: Ova and parasites: NONE SEEN

## 2010-11-14 LAB — APTT
aPTT: 30 seconds (ref 24–37)
aPTT: 26 seconds (ref 24–37)

## 2010-11-14 LAB — PROTIME-INR
INR: 1 (ref 0.00–1.49)
INR: 1 (ref 0.00–1.49)
Prothrombin Time: 13.1 seconds (ref 11.6–15.2)
Prothrombin Time: 12.6 seconds (ref 11.6–15.2)

## 2010-12-20 ENCOUNTER — Emergency Department (HOSPITAL_COMMUNITY): Payer: Medicare Other

## 2010-12-20 ENCOUNTER — Emergency Department (HOSPITAL_COMMUNITY)
Admission: EM | Admit: 2010-12-20 | Discharge: 2010-12-20 | Disposition: A | Discharge status: Home / Self Care | Payer: Medicare Other | Attending: Emergency Medicine | Admitting: Emergency Medicine

## 2010-12-20 DIAGNOSIS — R55 Syncope and collapse: Secondary | ICD-10-CM | POA: Insufficient documentation

## 2010-12-20 DIAGNOSIS — R109 Unspecified abdominal pain: Secondary | ICD-10-CM | POA: Insufficient documentation

## 2010-12-20 DIAGNOSIS — I509 Heart failure, unspecified: Secondary | ICD-10-CM | POA: Insufficient documentation

## 2010-12-20 DIAGNOSIS — Z8546 Personal history of malignant neoplasm of prostate: Secondary | ICD-10-CM | POA: Insufficient documentation

## 2010-12-20 DIAGNOSIS — I10 Essential (primary) hypertension: Secondary | ICD-10-CM | POA: Insufficient documentation

## 2010-12-20 DIAGNOSIS — Z96659 Presence of unspecified artificial knee joint: Secondary | ICD-10-CM | POA: Insufficient documentation

## 2010-12-20 LAB — POCT CARDIAC MARKERS
CKMB, poc: 1.4 ng/mL (ref 1.0–8.0)
Myoglobin, poc: 228 ng/mL (ref 12–200)
Myoglobin, poc: 169 ng/mL (ref 12–200)
Troponin i, poc: <0.05 ng/mL (ref 0.00–0.09)

## 2010-12-20 LAB — COMPREHENSIVE METABOLIC PANEL
ALT: 17 U/L (ref 0–53)
BUN: 46 mg/dL — ABNORMAL HIGH (ref 6–23)
CO2: 26 mEq/L (ref 19–32)
Calcium: 8.7 mg/dL (ref 8.4–10.5)
Creatinine, Ser: 2.01 mg/dL — ABNORMAL HIGH (ref 0.4–1.5)
GFR calc non Af Amer: 32 mL/min — ABNORMAL LOW (ref >60)
Glucose, Bld: 99 mg/dL (ref 70–99)

## 2010-12-20 LAB — DIFFERENTIAL
Basophils Absolute: 0 10*3/uL (ref 0.0–0.1)
Basophils Relative: 0 % (ref 0–1)
Eosinophils Relative: 1 % (ref 0–5)
Monocytes Absolute: 1.3 10*3/uL — ABNORMAL HIGH (ref 0.1–1.0)

## 2010-12-20 LAB — URINALYSIS, ROUTINE W REFLEX MICROSCOPIC
Bilirubin Urine: NEGATIVE
Ketones, ur: NEGATIVE mg/dL
Nitrite: NEGATIVE
Specific Gravity, Urine: 1.021 (ref 1.005–1.030)
Urobilinogen, UA: 0.2 mg/dL (ref 0.0–1.0)

## 2010-12-20 LAB — GLUCOSE, CAPILLARY

## 2010-12-20 LAB — CBC
MCHC: 33.9 g/dL (ref 30.0–36.0)
Platelets: 238 10*3/uL (ref 150–400)
RDW: 12.6 % (ref 11.5–15.5)
WBC: 13.4 10*3/uL — ABNORMAL HIGH (ref 4.0–10.5)

## 2010-12-23 NOTE — Op Note (Signed)
Mark Jackson, Mark Jackson                ACCOUNT NO.:  [id number]   MEDICAL RECORD NO.:  [id number]          PATIENT TYPE:  AMB   LOCATION:  [facility]                         FACILITY:  [facility]   PHYSICIAN:  Ronald L. Rosana Hoes, M.D.  DATE OF BIRTH:  [birth year]   DATE OF PROCEDURE:  06/03/2007  DATE OF DISCHARGE:                               OPERATIVE REPORT   DIAGNOSIS:  Adenocarcinoma of the prostate.   OPERATIVE PROCEDURE:  Transperineal implantation of iodine-125 seeds  with the robotic arm Nucletron and cystourethroscopy.   SURGEON:  Tresa Endo, MD   ASSISTANT:  Tyler Pita, MD   ANESTHESIA:  General endotracheal.   ESTIMATED BLOOD LOSS:  Negligible.   TUBES:  A 16-French Foley catheter.   COMPLICATIONS:  None.  A total of 68 iodine-125 seeds were implanted  with 25 needles at 26.5200 total apparent activity.   INDICATIONS FOR PROCEDURE:  Mark Jackson is a very nice 77 year old white  male who presented with a fairly low PSA of 2.8, but he had a palpable  region abnormality in the left side of the prostate.  He subsequently  underwent ultrasound and biopsy of prostate, which revealed that 5% of  the biopsies on the right and left base had Gleason score 6, 3 + 3  adenocarcinoma and 10% of the biopsy of the left apex and 40% biopsy of  the right apex had a Gleason score 6, which 3+ 3 adenocarcinoma.  It was  felt that he was a clinical stage T2b.  After understanding the risks,  benefits and alternatives, he elected to undergo seed implantation.  He  has been properly simulated and properly informed.   PROCEDURE IN DETAIL:  The patient was placed in the supine position and  after proper general endotracheal anesthesia, he was placed in the  dorsal lithotomy position and prepped and draped with Betadine in a  sterile fashion.  A 16-French Foley catheter was inserted and the  bladder was drained and the patient was placed in the dorsal lithotomy  position and prepped.  The B&K  biplanar ultrasound probe was placed, the  prostate was localized and is documented in the records, dosimetry was  carefully planned so as to optimize the dose to the prostate and  minimize the dose to the urethra and the rectum.  Two holding needles  had been placed in unused coordinates and following this, an initial  base setting needle was placed and the initial seed implantation was  performed.  Serial implantation was then performed using the robotic arm  Actor.  After the needles had been placed, 25 needles were  utilized to implant 68 iodine-125 seeds with a total apparent activity  of 26.5200 mCi.  We were very comfortable with the seed location.  All  hardware was removed, except the Foley catheter.  Static images were  obtained fluoroscopically and again we were again comfortable with the  seed location.  The Foley catheter was removed and scanned for seeds and  there were none within it.  A flexible cystourethroscopy was then  performed.  He had mild  trilobar hypertrophy with grade 1 trabeculation of the bladder.  Efflux  of clear urine was noted from the normally placed ureteral orifices  bilaterally and there were no lesions in the bladder or the urethra.  A  new Foley catheter was placed and the patient was taken to the recovery  room, stable.      [place] Rosana Hoes, M.D.  Electronically Signed     RLD/MEDQ  D:  06/03/2007  T:  06/04/2007  Job:  [id number]

## 2010-12-23 NOTE — Procedures (Signed)
CAROTID DUPLEX EXAM   INDICATION:  Followup carotid artery disease per [facility].   HISTORY:  Diabetes:  No  Cardiac:  No  Hypertension:  Yes  Smoking:  Quit  Previous Surgery:  No  CV History:  No  Amaurosis Fugax No, Paresthesias No, Hemiparesis No                                       RIGHT             LEFT  Brachial systolic pressure:         132               126  Brachial Doppler waveforms:         Triphasic         Triphasic  Vertebral direction of flow:        Antegrade         Antegrade  DUPLEX VELOCITIES (cm/sec)  CCA peak systolic                   73                63  ECA peak systolic                   131               74  ICA peak systolic                   123               86  ICA end diastolic                   40                29  PLAQUE MORPHOLOGY:                  Mixed             Mixed  PLAQUE AMOUNT:                      Mild/moderate     Mild  PLAQUE LOCATION:                    Bifurcation/ICA   Bifurcation/ICA   IMPRESSION:  1. The right internal carotid artery shows evidence of 40% to 59%      stenosis (lower end of range).  2. The left internal carotid artery shows evidence of 20% to 39%      stenosis.   ___________________________________________  Jessy Oto Fields, MD   AS/MEDQ  D:  03/21/2007  T:  03/22/2007  Job:  [id number]

## 2010-12-26 NOTE — H&P (Signed)
Mark Jackson, Mark Jackson                ACCOUNT NO.:  [id number]   MEDICAL RECORD NO.:  [id number]          PATIENT TYPE:  INP   LOCATION:                               FACILITY[facility]  [facility]   PHYSICIAN:  Cynda Familia, M.D.DATE OF BIRTH:  [birth year]   DATE OF ADMISSION:  04/23/2009  DATE OF DISCHARGE:                              HISTORY & PHYSICAL   HISTORY OF PRESENT ILLNESS:  Patient is a 77 year old gentleman with  pain and difficulty with range of motion of his right knee.  Patient has  significant arthritic changes with loss of lateral joint spacing with  calcific chondrosis-type changes.  He has failed cortisone and  viscosupplement injections and the patient has elected to proceed with a  total knee arthroplasty on the right.   PRIMARY CARE PHYSICIAN:  Dr. [name].   ALLERGIES:  NONE.   CURRENT MEDICATIONS:  1. Cozaar 50 mg a day.  2. Triamterene/hydrochlorothiazide 37.5 mg a day.  3. Multivitamins.  4. Aspirin.  5. Iron 325 mg 3 times a week.  6. Vitamin B12 shots monthly.  7. Glucosamine chondroitin.  8. Sudafed p.r.n.  9. Tylenol and Advil p.r.n.  10.Diphenhydramine nightly.  11.Cinnamon.   PAST MEDICAL HISTORY:  1. Sleep apnea without the use of a CPAP machine.  2. Hypertension.  3. Hemorrhoids.  4. History of diverticulosis.  5. History of prostate cancer, status post brady therapy.  6. Increased urinary incontinence and frequency secondary to history      of prostate cancer.  7. Chronic anemia, on vitamin B12 and iron.   PAST SURGICAL HISTORY:  1. Prostate surgery in 2008.  2. Appendectomy.  3. Tonsillectomy.  4. Bilateral knees.  5. Cataracts and eyelids without any complications.   REVIEW OF SYSTEMS:  He denies any neurologic issues at that time other  than corrected by Surgery cataracts.  PULMONARY:  He does have sleep  apnea.  He does snore.  He does have to lay on his side.  He does not  use a CPAP machine.  CARDIOVASCULAR:  He does have  some slight blood  pressure but it is well controlled with his current medications.  He did  have a cardiac stress test 4 weeks previous in order to get clearance  for this upcoming surgical procedure.  The stress test was normal.  GI:  He does have a history of hemorrhoids and a history of diverticulosis in  the past.  GU:  He does have a history of prostate cancer.  He does have  increased urinary frequency due to the prostate cancer.  ENDOCRINE:  Unremarkable.  HEMATOLOGIC:  He has had anemia in the past.  He is on  chronic B12 and iron.   FAMILY MEDICAL HISTORY:  Father is deceased at the age of 59 from  suicide.  Mother is deceased at 19 for natural causes and history of  male cancer.  One sibling was killed in a car wreck, one was murdered,  and 1 son with a history of testicular cancer.   SOCIAL HISTORY:  Patient is married, lives with his  wife.  He is  currently the Teacher, English as a foreign language for [employer].  He has smoked in  the past, about 20 years previously, a pipe and a cigar.  He does not  drink.  He has 4 children.   PHYSICAL EXAM:  VITALS:  Height is 5 feet 10.  Weight is 202 pounds.  Patient is otherwise a healthy, well-appearing, developed gentleman.  Appears to be in no extreme distress.  Blood pressure is 142/82.  Pulse  of 74 and regular.  Respirations 12.  GENERAL:  This is a healthy-appearing gentleman.  Appears to be in no  distress.  HEENT:  Head was normocephalic.  Pupils equal, round, and reactive.  Extraocular movements intact.  Gross hearing is intact.  NECK:  Good range of motion.  No lymphadenopathy.  CHEST:  Lung sounds were clear and equal bilaterally.  No wheezes,  rales, or rhonchi.  HEART:  Regular rate and rhythm.  No murmurs, rubs, or gallops.  ABDOMEN:  Soft, nontender.  Bowel sounds present.  No CVA region  discomfort.  EXTREMITIES:  Upper extremities had excellent range of motion without  difficulty.  LOWER EXTREMITIES:  Both hips had good  range of motion.  His right knee  he was able to fully extend.  He could flex it back to 120.  He did have  crepitus in the knee.  He was tender along the lateral joint.  He had no  effusion.  Calf was soft and nontender.  He had good range of motion of  his left knee with just a little soreness.  His calf was soft and  nontender.  He had good motion of both ankles.  PERIPHERAL VASCULAR:  Carotid pulses were 2+, no bruits.  Radial pulses  2+.  Posterior tibial pulses were 1+.  He had no lower extremity edema  or venous stasis changes.  BREASTS:  Exam was deferred at this time.  RECTAL:  Exam was deferred at this time.  GU:  Exam was deferred at this time.   IMPRESSION:  1. Advanced osteoarthritis, right knee.  2. History of diverticulosis.  3. History of hypertension.  4. History of prostate cancer.  5. History of anemia.  6. Recent cardiac stress test within normal limits.   PLAN:  Patient will undergo all routine labs and tests prior to having a  right total knee arthroplasty at [facility] by Dr. Hart Robinsons.  Patient will undergo all routine labs and tests prior to this  procedure.      Mark Jackson, P.A.    ______________________________  Cynda Familia, M.D.    RWK/MEDQ  D:  04/05/2009  T:  04/05/2009  Job:  [id number]   cc:   Cynda Familia, M.D.  Fax: [place]. Everlene Farrier, M.D.  Fax: [phone]

## 2011-01-14 ENCOUNTER — Observation Stay (HOSPITAL_COMMUNITY): Payer: Medicare Other

## 2011-01-14 ENCOUNTER — Emergency Department (HOSPITAL_COMMUNITY): Payer: Medicare Other

## 2011-01-14 ENCOUNTER — Observation Stay (HOSPITAL_COMMUNITY)
Admission: EM | Admit: 2011-01-14 | Discharge: 2011-01-15 | Disposition: A | Discharge status: Home / Self Care | Payer: Medicare Other | Attending: Family Medicine | Admitting: Family Medicine

## 2011-01-14 DIAGNOSIS — N183 Chronic kidney disease, stage 3 unspecified: Secondary | ICD-10-CM | POA: Insufficient documentation

## 2011-01-14 DIAGNOSIS — R112 Nausea with vomiting, unspecified: Secondary | ICD-10-CM | POA: Insufficient documentation

## 2011-01-14 DIAGNOSIS — R5383 Other fatigue: Secondary | ICD-10-CM

## 2011-01-14 DIAGNOSIS — Z7982 Long term (current) use of aspirin: Secondary | ICD-10-CM | POA: Insufficient documentation

## 2011-01-14 DIAGNOSIS — Z79899 Other long term (current) drug therapy: Secondary | ICD-10-CM | POA: Insufficient documentation

## 2011-01-14 DIAGNOSIS — R5381 Other malaise: Secondary | ICD-10-CM

## 2011-01-14 DIAGNOSIS — Z8546 Personal history of malignant neoplasm of prostate: Secondary | ICD-10-CM | POA: Insufficient documentation

## 2011-01-14 DIAGNOSIS — I129 Hypertensive chronic kidney disease with stage 1 through stage 4 chronic kidney disease, or unspecified chronic kidney disease: Secondary | ICD-10-CM | POA: Insufficient documentation

## 2011-01-14 DIAGNOSIS — R55 Syncope and collapse: Principal | ICD-10-CM | POA: Insufficient documentation

## 2011-01-14 DIAGNOSIS — R609 Edema, unspecified: Secondary | ICD-10-CM | POA: Insufficient documentation

## 2011-01-14 DIAGNOSIS — N179 Acute kidney failure, unspecified: Secondary | ICD-10-CM | POA: Insufficient documentation

## 2011-01-14 HISTORY — DX: Anemia, unspecified: D64.9

## 2011-01-14 LAB — CBC
MCV: 94.7 fL (ref 78.0–100.0)
Platelets: 206 10*3/uL (ref 150–400)
RDW: 12.5 % (ref 11.5–15.5)
WBC: 13.6 10*3/uL — ABNORMAL HIGH (ref 4.0–10.5)

## 2011-01-14 LAB — DIFFERENTIAL
Eosinophils Absolute: 0.2 10*3/uL (ref 0.0–0.7)
Eosinophils Relative: 2 % (ref 0–5)
Lymphs Abs: 1.5 10*3/uL (ref 0.7–4.0)

## 2011-01-14 LAB — URINALYSIS, ROUTINE W REFLEX MICROSCOPIC
Protein, ur: NEGATIVE mg/dL
Urobilinogen, UA: 0.2 mg/dL (ref 0.0–1.0)

## 2011-01-14 LAB — COMPREHENSIVE METABOLIC PANEL
Alkaline Phosphatase: 53 U/L (ref 39–117)
BUN: 50 mg/dL — ABNORMAL HIGH (ref 6–23)
Chloride: 102 mEq/L (ref 96–112)
GFR calc non Af Amer: 27 mL/min — ABNORMAL LOW (ref >60)
Glucose, Bld: 107 mg/dL — ABNORMAL HIGH (ref 70–99)
Potassium: 4.5 mEq/L (ref 3.5–5.1)
Total Bilirubin: 0.3 mg/dL (ref 0.3–1.2)

## 2011-01-14 LAB — CK TOTAL AND CKMB (NOT AT ARMC): CK, MB: 3.2 ng/mL (ref 0.3–4.0)

## 2011-01-15 ENCOUNTER — Encounter (HOSPITAL_COMMUNITY): Payer: Self-pay | Admitting: Radiology

## 2011-01-15 DIAGNOSIS — R0989 Other specified symptoms and signs involving the circulatory and respiratory systems: Secondary | ICD-10-CM

## 2011-01-15 LAB — CBC
HCT: 32 % — ABNORMAL LOW (ref 39.0–52.0)
Hemoglobin: 10.6 g/dL — ABNORMAL LOW (ref 13.0–17.0)
MCH: 31.5 pg (ref 26.0–34.0)
MCHC: 33.1 g/dL (ref 30.0–36.0)
RDW: 12.6 % (ref 11.5–15.5)

## 2011-01-15 LAB — COMPREHENSIVE METABOLIC PANEL
BUN: 47 mg/dL — ABNORMAL HIGH (ref 6–23)
CO2: 26 mEq/L (ref 19–32)
Calcium: 8.1 mg/dL — ABNORMAL LOW (ref 8.4–10.5)
Creatinine, Ser: 1.96 mg/dL — ABNORMAL HIGH (ref 0.4–1.5)
GFR calc non Af Amer: 33 mL/min — ABNORMAL LOW (ref >60)
Glucose, Bld: 108 mg/dL — ABNORMAL HIGH (ref 70–99)
Sodium: 138 mEq/L (ref 135–145)
Total Protein: 5.7 g/dL — ABNORMAL LOW (ref 6.0–8.3)

## 2011-01-15 LAB — CARDIAC PANEL(CRET KIN+CKTOT+MB+TROPI)
CK, MB: 2.2 ng/mL (ref 0.3–4.0)
CK, MB: 2.4 ng/mL (ref 0.3–4.0)
Relative Index: 1.8 (ref 0.0–2.5)
Total CK: 119 U/L (ref 7–232)
Troponin I: <0.3 ng/mL (ref <0.30)
Troponin I: <0.3 ng/mL (ref <0.30)
Troponin I: <0.3 ng/mL (ref <0.30)

## 2011-01-15 LAB — TECHNOLOGIST SMEAR REVIEW

## 2011-01-26 NOTE — Discharge Summary (Signed)
Mark Jackson, Mark Jackson                ACCOUNT NO.:  [id number]  MEDICAL RECORD NO.:  [id number]  LOCATION:  [facility]                         FACILITY:  [facility] [facility]  PHYSICIAN:  Jamal Collin. Hensel, M.D.DATE OF BIRTH:  06/08/1930  DATE OF ADMISSION:  01/14/2011 DATE OF DISCHARGE:  01/15/2011                              DISCHARGE SUMMARY   DISCHARGE DIAGNOSES: 1. Presyncope. 2. Hypertension. 3. Acute-on-chronic renal failure resolved. 4. Stage III chronic kidney disease. 5. Lower extremity edema. 6. History of prostate cancer status post treatment 4 years ago.  DISCHARGE MEDICATIONS: 1. Aspirin 81 mg p.o. daily. 2. Lasix 40 mg p.o. p.r.n. for leg swelling. 3. Losartan 25 mg p.o. daily (decreased from previous dose of 50 mg). 4. Osteo Bi-Flex over-the counter. 5. Surgical stockings for lower extremity edema. 6. Tylenol 325 mg 2 tablets p.o. q.8 h. P.r.n.  LABORATORY DATA AND PROCEDURES: 1. Cardiac enzymes negative x4. 2. 2D echocardiogram showing ejection fraction 55-60% with normal     systolic function and normal wall motion with mildly calcified     aortic leaflets, pulmonary artery pressure 32 mmHg.  Grade 2     diastolic dysfunction.  No evidence of elevated ventricular filling     pressure, and no valvular dysfunction. 3. Carotid Dopplers showing 60-79% stenosis of right ICA and none on     the left. 4. Peripheral blood smear showing atypical lymphocytes. 5. EKG showing normal sinus rhythm with a borderline PR interval 178,     otherwise no abnormalities. 6. CT head without contrast showing stable age-related cerebral     atrophy, ventriculomegaly, and periventricular white matter     disease, and no acute intracranial findings. 7. Chest x-ray showing borderline enlargement of cardiac silhouette     and mild chronic bronchitic changes, and no acute abnormalities.   HOSPITAL COURSE:  This is a 77 year old male who presents with recurrent presyncopal episodes. 1. Presyncope.   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  He began to feel weak     and called for his son prior to sitting down.  The patient slumped     his head over was speaking clearly.  However, due to generalized     weakness, did begin to slur words transiently with resolution of     his speech in less than 1 minute.  The patient never lost     consciousness, exhibited focal motor deficits or seizure activity.     He had no associated chest pain, shortness of breath, or     palpitations.  Cardiac workup was again negative with enzyme testing, EKG, and     echocardiogram.  He was monitored on telemetry in the hospital     greater than 24 hours with no concerning events recorded. Lab studies revealed mild acute-on-chronic renal failure concerning was possible dehydration as an etiology for his     symptoms.  The patient was given IV fluids overnight, and he did     feel better in the morning prior to discharge.  Likely this was related to medication effect with daily     Lasix for lower extremity  edema and possibly overtreatment of hypertension in an elderly patient.  For this reason, his Lasix was     changed to p.r.n. dosing, and he was prescribed surgical stockings.     Additionally, his Cozaar was decreased by half dose.  Notably, his     blood pressure was 127/79 without receiving treatment on day of     discharge. 2. Hypertension.  The patient was at goal prior to discharge, and his     Cozaar was cut in half to 25 mg daily. 3. Acute-on-chronic renal failure.  The patient's baseline creatinine     was noted to be 1.8-2.0, and had stage III CKD and followed by     [facility].  On admission, his creatinine was 2.33     and resolved to 1.96 prior to discharge with fluid hydration. 4. Nausea, vomiting on the morning of his discharge.  The patient was     noted to have a frontal headache similar to previous headaches and      received tramadol and Tylenol for this pain.  Subsequently, he     developed nausea and vomiting, which improved throughout the course     of the day.  He was kept in house and cardiac enzymes were cycled     again to rule out cardiac etiology.  Again, no events on telemetry,     and EKG showed no dynamic changes that could be causing his GI     disturbance.  With development of emesis, possibly his symptoms are related and caused by vasovagal reaction.  The patient did feel well with no further     symptoms of syncope prior to discharge.   DISCHARGE INSTRUCTIONS:  The patient was discharged to home with instructions to drink plenty of fluids, increase activity slowly, and to call MD or return to emergency department for any one-sided weakness, speech problems, loss of consciousness, chest pain, intractable nausea, vomiting, or any other concerning symptoms.  FOLLOWUP APPOINTMENTS: 1. With Dr. Everlene Farrier, PCP.  The patient to make appointment within 1     week. 2. [facility].  CARDIOLOGIST:  Scheduled for Monday, January 19, 2011.  FOLLOWUP ISSUES: 1. Hypertension monitoring and medication titration. 2. Chronic leukocytosis workup as indicated.   The patient was discharged to home in stable medical condition.    ______________________________ Luis Abed, MD   ______________________________ Jamal Collin. Andria Frames, M.D.    Rockne Coons  D:  01/16/2011  T:  01/17/2011  Job:  [id number]  Electronically Signed by Luis Abed MD on 01/24/2011 10:16:48 PM Electronically Signed by Madison Hickman M.D. on 01/26/2011 09:36:05 AM

## 2011-01-27 NOTE — H&P (Signed)
Mark Jackson, Mark Jackson NO.:  [id number]  MEDICAL RECORD NO.:  [id number]  LOCATION:  [facility]                         FACILITY:  [place]  PHYSICIAN:  Dalbert Mayotte, MD        DATE OF BIRTH:  1929-12-23  DATE OF ADMISSION:  01/14/2011 DATE OF DISCHARGE:                             HISTORY & PHYSICAL   PRIMARY CARE PHYSICIAN:  Richardson Landry A. Everlene Farrier, MD, at [facility].  CHIEF COMPLAINT:  Near syncope.  HISTORY OF PRESENT ILLNESS:  This is a 77 year old male with a history of hypertension and prostate cancer status post radiation treatment who presents with pre-syncope today.  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  He sat down at desk and called his son for help.  Son reports hearing clear speech initially, then began to have some transient slurring of words which resolved after several minutes. He exhibited no focal motor deficits and did not lose conciousness.  The patient complained of nausea,some burping, and felt "shakiness."  EMS was called and he was transferred to the ED for further evaluation.  His symptoms had resolved at the time of EMS arrival and similarly were absent in the emergency department.  Notably, he was seen for similar episode of syncope at [facility] on Dec 20, 2010, and sent home from the ED after workup was negative.  Only abnormality noted was leukocytosis and a creatinine of 2.0 on top of his chronic renal disease with a baseline of 1.8.  He is followed by PCP, Dr. Everlene Farrier who has started outpatient syncope workup with carotid Dopplers which are scheduled in 2 days as well as cardiology followup at [facility] with which he is an established patient.  Notably, in February 2012, the patient had a low- risk profile on Persantine stress testing with an EF of 55%.  REVIEW OF SYSTEMS:  The patient denies visual changes, headache, positional syncope, palpitations, chest pain, dyspnea, fevers, cough, dysuria,  hematochezia, and emesis.  He does endorse lightheadedness with bending over, urinary frequency, lower extremity edema, and chronic bouts of diarrhea having 3 bowel movements of normal consistency today.  PAST MEDICAL HISTORY: 1. Hypertension. 2. Prostate cancer, status post radiation 4 years ago. 3. Frequent bouts of diarrhea since 2010, treatment for C. diff with     Flagyl x1 and last episode on Dec 27, 2010, with diarrhea and     emesis after eating chicken salad. 4. Lower extremity edema.  PAST SURGICAL HISTORY: 1. Right knee replacement. 2. Appendectomy. 3. Tonsillectomy. 4. Cataracts.  SOCIAL HISTORY:  The patient lives in [place] with his wife and has close family members nearby.  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  He denies tobacco, alcohol, or drug use.  FAMILY HISTORY: 1. Mother with ovarian cancer. 2. None significant with father.  ALLERGIES:  No known drug allergies.  MEDICATIONS: 1. Cozaar 50 mg daily 2. Lasix 40 mg daily. 3. Vitamin B12 every month. 4. Aspirin 81 mg daily. 5. Multivitamin daily.  PHYSICAL EXAMINATION:  VITAL SIGNS:  Temp 97.8, pulse 71, respirations 14, BP 132/69, and O2 sat 100% on 2 liters. GENERAL APPEARANCE:  No apparent  distress. HEENT:  PERRLA.  EOMI.  Mucous membranes moist.  No oropharyngeal lesions. NECK:  Supple. CARDIOVASCULAR:  Regular rate and rhythm.  No murmurs.  2+ peripheral pulses in bilateral lower extremities. LUNGS:  Clear to auscultation bilaterally.  Nonlabored respirations. ABDOMEN:  Soft, bowel sounds positive, nontender, nondistended. EXTREMITIES:  1+ bilateral lower extremity edema with 2+ DP pulses bilaterally. NEUROLOGIC:  Alert and oriented x3.  5/5 bilateral upper extremity and lower extremity strength.  Coordination intact.  Cranial nerves II through XII intact and no sensory deficits.  LABORATORY DATA AND STUDIES: 1. White blood count 13.6 with 80% PMNs, hemoglobin 12.6, hematocrit     37.4, and  platelets 206. 2. Sodium 140, potassium 4.5, chloride 102, bicarb 28, BUN 50,     creatinine 2.33, glucose 107, calcium 9.1, total protein 6.9,     albumin 3.4, AST 19, ALT 15. 3. Troponin less than 0.30, CK 64, CK-MB 3.2.  Urinalysis:  Specific     gravity 1.014, negative nitrites, negative leukocyte esterase. 4. EKG showing 69 beats per minute, normal sinus rhythm. 5. Orthostatics:     a.     Lying blood pressure 134/74, pulse 83.     b.     Sitting 134/77, pulse 86.     c.     Standing 124/75, pulse 85. 6. Chest x-ray shows borderline enlargement of cardiac silhouette and     tortuous aorta and mild bronchitic changes with no acute     abnormalities.  ASSESSMENT/PLAN:  This is a 77 year old male with a history of hypertension and remote history of prostate cancer who presents with recurrent presyncopal events. 1. Presyncope.  The patient exhibits no symptoms currently, but this     is his second occurrence in the past month.  Initial workup was     unrevealing and history suggests possible dehydration versus     medication effect as most likely cause, specifically daily Lasix     and beta-blockade.  Additionally, chronic diarrhea could contribute     to his overall diminished fluid status.  His acute on chronic renal     failure would also suggest this.  Less likely etiology includes     cardiac or neurologic as he has no associated chest pain, dyspnea,     EKG or cardiac enzyme abnormalities.  He also lacks focal neural     deficits now or during his event.  We will monitor the patient on     telemetry and repeat EKG in the a.m. and cycle cardiac enzymes     tonight.  We will hold his beta-blocker and Lasix.  We will give a     500-mL normal saline bolus and start maintenance IV fluids.     Outpatient workup with carotid Dopplers and cardiac evaluation have     been scheduled within the week with [facility]     and he will likely continue with this original  plan as scheduled. 2. Hypertension.  Orthostatics were negative, but the 5-minute     interval between measurements was not observed and accuracy is      questionable.  We will hold beta-blocker and consider restarting a     lower dose given the patient's age and potential for side effects.     His blood pressure is currently controlled and well below his goal. 3. Acute on chronic renal insufficiency.  Creatinine baseline is 1.8     since 2010 and is followed by [facility].  Notably,     his creatinine was 2.0 in the emergency department on Dec 20, 2010,     and today is again above his baseline at 2.3.  We will fluid     hydrate and consider renal ultrasound and further workup if this is     not improved in the a.m.  Urinalysis has no signs of infection or     sediment, but question possible obstruction with history of     prostate cancer and frequency. 4. Fluids, electrolytes, nutrition.  We will allow heart-healthy diet     and start maintenance IV fluids with normal saline at 100 mL per     hour.  We will follow up electrolytes in the a.m. 5. Prophylaxis.  The patient is mobile currently and we will defer     chemoprophylaxis unless the patient is admitted for greater than 24     hours. 6. Disposition.  We will observe the patient on telemetry overnight     and likely ready for discharge with outpatient workup in that a.m.    ______________________________ Luis Abed, MD   ______________________________ Dalbert Mayotte, MD    JK/MEDQ  D:  01/14/2011  T:  01/15/2011  Job:  [id number]  Electronically Signed by Luis Abed MD on 01/24/2011 10:12:18 PM Electronically Signed by Dalbert Mayotte MD on 01/27/2011 05:40:04 PM

## 2011-05-20 LAB — COMPREHENSIVE METABOLIC PANEL
ALT: 18
AST: 26
Albumin: 3.2 — ABNORMAL LOW
Alkaline Phosphatase: 49
BUN: 23
CO2: 31
Calcium: 8.8
Chloride: 100
Creatinine, Ser: 1.61 — ABNORMAL HIGH
GFR calc Af Amer: 51 — ABNORMAL LOW
GFR calc non Af Amer: 42 — ABNORMAL LOW
Glucose, Bld: 88
Potassium: 4.7
Sodium: 135
Total Bilirubin: 0.8
Total Protein: 5.8 — ABNORMAL LOW

## 2011-05-20 LAB — APTT: aPTT: 32

## 2011-05-20 LAB — CBC
HCT: 40.4
Hemoglobin: 13.9
MCHC: 34.4
MCV: 92.9
Platelets: 245
RBC: 4.35
RDW: 13.1
WBC: 10.3

## 2011-05-20 LAB — PROTIME-INR
INR: 1
Prothrombin Time: 13

## 2011-08-12 DIAGNOSIS — H35379 Puckering of macula, unspecified eye: Secondary | ICD-10-CM | POA: Diagnosis not present

## 2011-08-20 DIAGNOSIS — R609 Edema, unspecified: Secondary | ICD-10-CM | POA: Diagnosis not present

## 2011-08-20 DIAGNOSIS — I10 Essential (primary) hypertension: Secondary | ICD-10-CM | POA: Diagnosis not present

## 2011-08-31 ENCOUNTER — Ambulatory Visit (INDEPENDENT_AMBULATORY_CARE_PROVIDER_SITE_OTHER): Payer: Medicare Other

## 2011-08-31 DIAGNOSIS — D518 Other vitamin B12 deficiency anemias: Secondary | ICD-10-CM

## 2011-09-21 DIAGNOSIS — I6529 Occlusion and stenosis of unspecified carotid artery: Secondary | ICD-10-CM | POA: Diagnosis not present

## 2011-09-21 DIAGNOSIS — I10 Essential (primary) hypertension: Secondary | ICD-10-CM | POA: Diagnosis not present

## 2011-10-08 ENCOUNTER — Ambulatory Visit (INDEPENDENT_AMBULATORY_CARE_PROVIDER_SITE_OTHER): Payer: Medicare Other | Admitting: Emergency Medicine

## 2011-10-08 ENCOUNTER — Ambulatory Visit: Payer: Medicare Other

## 2011-10-08 DIAGNOSIS — J209 Acute bronchitis, unspecified: Secondary | ICD-10-CM | POA: Diagnosis not present

## 2011-10-08 DIAGNOSIS — R05 Cough: Secondary | ICD-10-CM

## 2011-10-08 DIAGNOSIS — D649 Anemia, unspecified: Secondary | ICD-10-CM

## 2011-10-08 DIAGNOSIS — J329 Chronic sinusitis, unspecified: Secondary | ICD-10-CM

## 2011-10-08 DIAGNOSIS — J019 Acute sinusitis, unspecified: Secondary | ICD-10-CM | POA: Diagnosis not present

## 2011-10-08 LAB — POCT CBC
Granulocyte percent: 68 %G (ref 37–80)
HCT, POC: 40.5 % — AB (ref 43.5–53.7)
Hemoglobin: 12.6 g/dL — AB (ref 14.1–18.1)
Lymph, poc: 2.2 (ref 0.6–3.4)
MCH, POC: 29.9 pg (ref 27–31.2)
MCHC: 31.1 g/dL — AB (ref 31.8–35.4)
MCV: 96.2 fL (ref 80–97)
MID (cbc): 1 — AB (ref 0–0.9)
MPV: 8.7 fL (ref 0–99.8)
POC Granulocyte: 6.8 (ref 2–6.9)
POC LYMPH PERCENT: 21.7 %L (ref 10–50)
POC MID %: 10.3 %M (ref 0–12)
Platelet Count, POC: 265 10*3/uL (ref 142–424)
RBC: 4.21 M/uL — AB (ref 4.69–6.13)
RDW, POC: 13 %
WBC: 10 10*3/uL (ref 4.6–10.2)

## 2011-10-08 MED ORDER — CEFDINIR 300 MG PO CAPS: 300.0000 mg | ORAL_CAPSULE | Freq: Two times a day (BID) | ORAL | Status: AC

## 2011-10-08 MED ORDER — CYANOCOBALAMIN 1000 MCG/ML IJ SOLN
1000.0000 ug | Freq: Once | INTRAMUSCULAR | Status: AC
  Administered 2011-10-08: 1000 ug via INTRAMUSCULAR

## 2011-10-08 MED ORDER — HYDROCODONE-HOMATROPINE 5-1.5 MG/5ML PO SYRP: 5.0000 mL | ORAL_SOLUTION | Freq: Three times a day (TID) | ORAL | Status: AC | PRN

## 2011-10-08 NOTE — Progress Notes (Signed)
  Subjective:    Patient ID: Mark Jackson., male    DOB: 07-16-1930, 78 y.o.   MRN: [id number]  Cough This is a new problem. The current episode started 1 to 4 weeks ago. The problem has been gradually worsening. The problem occurs constantly. The cough is productive of purulent sputum. Associated symptoms include nasal congestion, postnasal drip, rhinorrhea, a sore throat and shortness of breath. Pertinent negatives include no chest pain, chills, ear pain or fever.      Review of Systems  Constitutional: Negative for fever and chills.  HENT: Positive for sore throat, rhinorrhea and postnasal drip. Negative for ear pain.   Respiratory: Positive for cough and shortness of breath.   Cardiovascular: Negative for chest pain.       Objective:   Physical Exam  Constitutional: He appears well-developed and well-nourished.  HENT:  Head: Normocephalic.  Eyes: Pupils are equal, round, and reactive to light.  Neck: No JVD present. No tracheal deviation present. No thyromegaly present.  Cardiovascular: Normal rate and regular rhythm.   Pulmonary/Chest: No respiratory distress. He has no wheezes. He has no rales.       There are rhonchi present in both lung fields no rales dullness to percussion.    [facility] reading (PRIMARY) by  Dr.Nao Linz chest x-ray shows no acute disease. The hilar areas are somewhat prominent on lateral but the PA view looks normal..       Assessment & Plan:   Patient presents with fever productive cough and purulent nasal drainage he

## 2011-10-15 DIAGNOSIS — L259 Unspecified contact dermatitis, unspecified cause: Secondary | ICD-10-CM | POA: Diagnosis not present

## 2011-11-02 DIAGNOSIS — I6529 Occlusion and stenosis of unspecified carotid artery: Secondary | ICD-10-CM | POA: Diagnosis not present

## 2011-11-03 ENCOUNTER — Ambulatory Visit (INDEPENDENT_AMBULATORY_CARE_PROVIDER_SITE_OTHER): Payer: Medicare Other | Admitting: Physician Assistant

## 2011-11-03 DIAGNOSIS — E538 Deficiency of other specified B group vitamins: Secondary | ICD-10-CM | POA: Diagnosis not present

## 2011-11-03 MED ORDER — CYANOCOBALAMIN 1000 MCG/ML IJ SOLN
1000.0000 ug | Freq: Once | INTRAMUSCULAR | Status: AC
  Administered 2011-11-03: 1000 ug via INTRAMUSCULAR

## 2011-11-03 NOTE — Progress Notes (Signed)
Here for B 12 injection.  Last was 10/08/11.  Ok to give as he is on a once a month schedule.

## 2011-11-24 ENCOUNTER — Ambulatory Visit (INDEPENDENT_AMBULATORY_CARE_PROVIDER_SITE_OTHER): Payer: Medicare Other | Admitting: Emergency Medicine

## 2011-11-24 ENCOUNTER — Encounter: Payer: Self-pay | Admitting: Emergency Medicine

## 2011-11-24 VITALS — BP 127/76 | HR 68 | Temp 97.0°F | Resp 16 | Ht 68.0 in | Wt 207.0 lb

## 2011-11-24 DIAGNOSIS — I10 Essential (primary) hypertension: Secondary | ICD-10-CM

## 2011-11-24 DIAGNOSIS — N058 Unspecified nephritic syndrome with other morphologic changes: Secondary | ICD-10-CM

## 2011-11-24 DIAGNOSIS — D649 Anemia, unspecified: Secondary | ICD-10-CM | POA: Diagnosis not present

## 2011-11-24 DIAGNOSIS — E538 Deficiency of other specified B group vitamins: Secondary | ICD-10-CM | POA: Diagnosis not present

## 2011-11-24 LAB — COMPREHENSIVE METABOLIC PANEL
Albumin: 3.9 g/dL (ref 3.5–5.2)
Alkaline Phosphatase: 53 U/L (ref 39–117)
BUN: 30 mg/dL — ABNORMAL HIGH (ref 6–23)
Calcium: 8.8 mg/dL (ref 8.4–10.5)
Glucose, Bld: 92 mg/dL (ref 70–99)
Potassium: 4.8 mEq/L (ref 3.5–5.3)

## 2011-11-24 LAB — CBC WITH DIFFERENTIAL/PLATELET
Basophils Relative: 1 % (ref 0–1)
Eosinophils Absolute: 0.2 10*3/uL (ref 0.0–0.7)
Eosinophils Relative: 3 % (ref 0–5)
HCT: 39.6 % (ref 39.0–52.0)
Hemoglobin: 12.8 g/dL — ABNORMAL LOW (ref 13.0–17.0)
MCH: 30.9 pg (ref 26.0–34.0)
MCHC: 32.3 g/dL (ref 30.0–36.0)
MCV: 95.7 fL (ref 78.0–100.0)
Monocytes Absolute: 1 10*3/uL (ref 0.1–1.0)
Monocytes Relative: 11 % (ref 3–12)
Neutro Abs: 6.4 10*3/uL (ref 1.7–7.7)

## 2011-11-24 MED ORDER — CYANOCOBALAMIN 1000 MCG/ML IJ SOLN
1000.0000 ug | Freq: Once | INTRAMUSCULAR | Status: AC
  Administered 2011-11-24: 1000 ug via INTRAMUSCULAR

## 2011-11-24 NOTE — Progress Notes (Signed)
  Subjective:    Patient ID: Mark Jackson., male    DOB: Dec 20, 1929, 78 y.o.   MRN: [id number]  HPI patient here to followup B12 deficiency as well as his renal insufficiency. He's been feeling well. Walking on a regular basis had no chest pain shortness of breath or other problems.    Review of Systems     Objective:   Physical Exam  HENT:  Head: Normocephalic.  Eyes: Pupils are equal, round, and reactive to light.  Neck: No tracheal deviation present. No thyromegaly present.  Cardiovascular: Normal rate, regular rhythm and normal heart sounds.   Pulmonary/Chest: Effort normal and breath sounds normal. No respiratory distress.  Abdominal: He exhibits no distension. There is no tenderness. There is no rebound.          Assessment & Plan:   Patient did well. We'll go ahead and check his labs to follow his renal function. B12 shot given today.

## 2011-12-21 DIAGNOSIS — I6529 Occlusion and stenosis of unspecified carotid artery: Secondary | ICD-10-CM | POA: Diagnosis not present

## 2011-12-21 DIAGNOSIS — I10 Essential (primary) hypertension: Secondary | ICD-10-CM | POA: Diagnosis not present

## 2011-12-24 ENCOUNTER — Ambulatory Visit (INDEPENDENT_AMBULATORY_CARE_PROVIDER_SITE_OTHER): Payer: Medicare Other | Admitting: Physician Assistant

## 2011-12-24 DIAGNOSIS — D510 Vitamin B12 deficiency anemia due to intrinsic factor deficiency: Secondary | ICD-10-CM

## 2011-12-24 DIAGNOSIS — D518 Other vitamin B12 deficiency anemias: Secondary | ICD-10-CM

## 2011-12-24 MED ORDER — CYANOCOBALAMIN 1000 MCG/ML IJ SOLN
1000.0000 ug | Freq: Once | INTRAMUSCULAR | Status: AC
  Administered 2011-12-24: 1000 ug via INTRAMUSCULAR

## 2011-12-24 MED ORDER — CYANOCOBALAMIN 1000 MCG/ML IJ SOLN: 1000.0000 ug | Freq: Once | INTRAMUSCULAR | Status: DC

## 2011-12-24 NOTE — Progress Notes (Signed)
  Subjective:    Patient ID: Mark Jackson., male    DOB: 08-22-1929, 77 y.o.   MRN: [id number]  HPI  Ok for B12 injection  Review of Systems n/a    Objective:   Physical Exam No exam        Assessment & Plan:  F/up as directed for next injection

## 2012-01-28 ENCOUNTER — Ambulatory Visit (INDEPENDENT_AMBULATORY_CARE_PROVIDER_SITE_OTHER): Payer: Medicare Other | Admitting: Family Medicine

## 2012-01-28 VITALS — BP 148/79 | HR 73 | Temp 97.9°F | Resp 16 | Ht 68.5 in | Wt 207.2 lb

## 2012-01-28 DIAGNOSIS — D519 Vitamin B12 deficiency anemia, unspecified: Secondary | ICD-10-CM

## 2012-01-28 DIAGNOSIS — E538 Deficiency of other specified B group vitamins: Secondary | ICD-10-CM | POA: Diagnosis not present

## 2012-01-28 MED ORDER — CYANOCOBALAMIN 1000 MCG/ML IJ SOLN
1000.0000 ug | INTRAMUSCULAR | Status: AC
  Administered 2012-01-28 – 2012-03-29 (×3): 1000 ug via INTRAMUSCULAR

## 2012-01-29 ENCOUNTER — Telehealth: Payer: Self-pay | Admitting: Family Medicine

## 2012-01-29 DIAGNOSIS — Z79899 Other long term (current) drug therapy: Secondary | ICD-10-CM | POA: Diagnosis not present

## 2012-01-29 DIAGNOSIS — I10 Essential (primary) hypertension: Secondary | ICD-10-CM | POA: Diagnosis not present

## 2012-01-29 NOTE — Telephone Encounter (Signed)
Pharmacy called for request on shingles vaccine and we sent back that we didn't know who this patient was.  They are resummiting the form for approval.

## 2012-02-01 ENCOUNTER — Other Ambulatory Visit: Payer: Self-pay | Admitting: Family Medicine

## 2012-02-08 DIAGNOSIS — C61 Malignant neoplasm of prostate: Secondary | ICD-10-CM | POA: Diagnosis not present

## 2012-02-15 DIAGNOSIS — C61 Malignant neoplasm of prostate: Secondary | ICD-10-CM | POA: Diagnosis not present

## 2012-03-01 ENCOUNTER — Ambulatory Visit (INDEPENDENT_AMBULATORY_CARE_PROVIDER_SITE_OTHER): Payer: Medicare Other

## 2012-03-01 VITALS — BP 140/82 | HR 80 | Temp 98.3°F | Resp 16 | Ht 68.5 in | Wt 210.0 lb

## 2012-03-01 DIAGNOSIS — E538 Deficiency of other specified B group vitamins: Secondary | ICD-10-CM

## 2012-03-01 NOTE — Progress Notes (Signed)
  Subjective:    Patient ID: Mark Jackson., male    DOB: 08-25-1929, 78 y.o.   MRN: [id number]  HPI    Review of Systems     Objective:   Physical Exam        Assessment & Plan:

## 2012-03-03 ENCOUNTER — Encounter: Payer: Self-pay | Admitting: Emergency Medicine

## 2012-03-29 ENCOUNTER — Ambulatory Visit (INDEPENDENT_AMBULATORY_CARE_PROVIDER_SITE_OTHER): Payer: Medicare Other | Admitting: Emergency Medicine

## 2012-03-29 ENCOUNTER — Encounter: Payer: Self-pay | Admitting: Emergency Medicine

## 2012-03-29 VITALS — BP 140/93 | HR 88 | Temp 97.1°F | Resp 18 | Ht 68.0 in | Wt 206.0 lb

## 2012-03-29 DIAGNOSIS — G629 Polyneuropathy, unspecified: Secondary | ICD-10-CM

## 2012-03-29 DIAGNOSIS — N189 Chronic kidney disease, unspecified: Secondary | ICD-10-CM | POA: Diagnosis not present

## 2012-03-29 DIAGNOSIS — N289 Disorder of kidney and ureter, unspecified: Secondary | ICD-10-CM | POA: Diagnosis not present

## 2012-03-29 DIAGNOSIS — E538 Deficiency of other specified B group vitamins: Secondary | ICD-10-CM

## 2012-03-29 DIAGNOSIS — Z1329 Encounter for screening for other suspected endocrine disorder: Secondary | ICD-10-CM

## 2012-03-29 DIAGNOSIS — E782 Mixed hyperlipidemia: Secondary | ICD-10-CM

## 2012-03-29 LAB — COMPREHENSIVE METABOLIC PANEL
ALT: 15 U/L (ref 0–53)
AST: 21 U/L (ref 0–37)
Albumin: 4 g/dL (ref 3.5–5.2)
CO2: 27 mEq/L (ref 19–32)
Calcium: 9 mg/dL (ref 8.4–10.5)
Chloride: 105 mEq/L (ref 96–112)
Creat: 1.94 mg/dL — ABNORMAL HIGH (ref 0.50–1.35)
Potassium: 4.6 mEq/L (ref 3.5–5.3)
Sodium: 141 mEq/L (ref 135–145)
Total Protein: 6.8 g/dL (ref 6.0–8.3)

## 2012-03-29 LAB — CBC WITH DIFFERENTIAL/PLATELET
Basophils Absolute: 0 10*3/uL (ref 0.0–0.1)
Basophils Relative: 0 % (ref 0–1)
Eosinophils Absolute: 0.3 10*3/uL (ref 0.0–0.7)
Eosinophils Relative: 4 % (ref 0–5)
HCT: 40.2 % (ref 39.0–52.0)
MCHC: 34.8 g/dL (ref 30.0–36.0)
MCV: 91.6 fL (ref 78.0–100.0)
Monocytes Absolute: 0.8 10*3/uL (ref 0.1–1.0)
RDW: 13.1 % (ref 11.5–15.5)

## 2012-03-29 LAB — LIPID PANEL
HDL: 38 mg/dL — ABNORMAL LOW (ref >39)
LDL Cholesterol: 87 mg/dL (ref 0–99)
Triglycerides: 92 mg/dL (ref <150)

## 2012-03-29 NOTE — Progress Notes (Signed)
  Subjective:    Patient ID: Mark Harris., male    DOB: 06-11-1930, 78 y.o.   MRN: [id number]  HPI pt presents for well check. Started taking Plavix about 3-4 months ago. It caused nosebleeds so he is only taking it 4 times a weeks. Having bilateral leg pain. He has a pins and needles sensation in his lower extremities. He's been walking every day. He has had no further syncopal episodes. He states he's only been able to tolerate a half a tablet a day his cholesterol drug. He is due to see his vascular doctor in 3 months.    Review of Systems     Objective:   Physical Exam  Constitutional: He appears well-developed.  HENT:  Head: Normocephalic.  Eyes: Pupils are equal, round, and reactive to light.  Neck: No tracheal deviation present. No thyromegaly present.  Cardiovascular: Normal rate, regular rhythm and intact distal pulses.  Exam reveals friction rub. Exam reveals no gallop.   No murmur heard. Pulmonary/Chest: Effort normal and breath sounds normal. No respiratory distress. He has no wheezes.  Abdominal: Soft. Bowel sounds are normal. He exhibits no distension. There is no tenderness. There is no rebound.  Musculoskeletal:       There are no deep tendon reflexes in the lower extremities. Has decreased sensations from the thighs down. Pulses are palpable dorsalis pedis.          Assessment & Plan:  Mark Jackson looks good. He is only able to tolerate a half a dose of his cholesterol medicine and has only taken half of Plavix a day. He is to check with his pharmacist to be sure he can take a half one a day versus one every other day.

## 2012-03-30 ENCOUNTER — Telehealth: Payer: Self-pay

## 2012-03-30 NOTE — Telephone Encounter (Signed)
Pt is requesting a mailed copy of his labs to his home.  [phone]

## 2012-03-30 NOTE — Telephone Encounter (Signed)
Mailed patient's lab results to his house per his request.

## 2012-04-21 ENCOUNTER — Ambulatory Visit (INDEPENDENT_AMBULATORY_CARE_PROVIDER_SITE_OTHER): Payer: Medicare Other

## 2012-04-21 ENCOUNTER — Other Ambulatory Visit: Payer: Self-pay | Admitting: Family Medicine

## 2012-04-21 DIAGNOSIS — E538 Deficiency of other specified B group vitamins: Secondary | ICD-10-CM

## 2012-04-21 DIAGNOSIS — Z23 Encounter for immunization: Secondary | ICD-10-CM

## 2012-04-21 MED ORDER — CYANOCOBALAMIN 1000 MCG/ML IJ SOLN: 1000.0000 ug | INTRAMUSCULAR | Status: DC

## 2012-04-27 DIAGNOSIS — E782 Mixed hyperlipidemia: Secondary | ICD-10-CM | POA: Diagnosis not present

## 2012-04-27 DIAGNOSIS — Z79899 Other long term (current) drug therapy: Secondary | ICD-10-CM | POA: Diagnosis not present

## 2012-05-05 ENCOUNTER — Ambulatory Visit (INDEPENDENT_AMBULATORY_CARE_PROVIDER_SITE_OTHER): Payer: Medicare Other | Admitting: Physician Assistant

## 2012-05-05 DIAGNOSIS — E538 Deficiency of other specified B group vitamins: Secondary | ICD-10-CM | POA: Diagnosis not present

## 2012-05-05 MED ORDER — CYANOCOBALAMIN 1000 MCG/ML IJ SOLN
1000.0000 ug | Freq: Once | INTRAMUSCULAR | Status: AC
  Administered 2012-05-05: 1000 ug via INTRAMUSCULAR

## 2012-05-13 ENCOUNTER — Telehealth: Payer: Self-pay

## 2012-05-13 DIAGNOSIS — I129 Hypertensive chronic kidney disease with stage 1 through stage 4 chronic kidney disease, or unspecified chronic kidney disease: Secondary | ICD-10-CM | POA: Diagnosis not present

## 2012-05-13 DIAGNOSIS — E7800 Pure hypercholesterolemia, unspecified: Secondary | ICD-10-CM | POA: Diagnosis not present

## 2012-05-13 NOTE — Telephone Encounter (Signed)
[facility] - patient is in the office now -  [phone]

## 2012-05-13 NOTE — Telephone Encounter (Signed)
Needs labs/ but they were able to pull this up in computer. Okay to sign off on this message per April

## 2012-06-03 ENCOUNTER — Other Ambulatory Visit: Payer: Self-pay | Admitting: Emergency Medicine

## 2012-06-06 DIAGNOSIS — I6529 Occlusion and stenosis of unspecified carotid artery: Secondary | ICD-10-CM | POA: Diagnosis not present

## 2012-06-14 ENCOUNTER — Ambulatory Visit (INDEPENDENT_AMBULATORY_CARE_PROVIDER_SITE_OTHER): Payer: Medicare Other | Admitting: *Deleted

## 2012-06-14 DIAGNOSIS — E538 Deficiency of other specified B group vitamins: Secondary | ICD-10-CM

## 2012-06-14 MED ORDER — CYANOCOBALAMIN 1000 MCG/ML IJ SOLN
1000.0000 ug | Freq: Once | INTRAMUSCULAR | Status: AC
  Administered 2012-06-14: 1000 ug via INTRAMUSCULAR

## 2012-06-20 DIAGNOSIS — I6529 Occlusion and stenosis of unspecified carotid artery: Secondary | ICD-10-CM | POA: Diagnosis not present

## 2012-06-20 DIAGNOSIS — E782 Mixed hyperlipidemia: Secondary | ICD-10-CM | POA: Diagnosis not present

## 2012-06-20 DIAGNOSIS — I10 Essential (primary) hypertension: Secondary | ICD-10-CM | POA: Diagnosis not present

## 2012-07-26 ENCOUNTER — Encounter: Payer: Self-pay | Admitting: Emergency Medicine

## 2012-07-26 ENCOUNTER — Ambulatory Visit (INDEPENDENT_AMBULATORY_CARE_PROVIDER_SITE_OTHER): Payer: Medicare Other | Admitting: Emergency Medicine

## 2012-07-26 VITALS — BP 138/84 | HR 79 | Temp 98.5°F | Resp 16 | Ht 68.0 in | Wt 208.0 lb

## 2012-07-26 DIAGNOSIS — I6529 Occlusion and stenosis of unspecified carotid artery: Secondary | ICD-10-CM

## 2012-07-26 DIAGNOSIS — E538 Deficiency of other specified B group vitamins: Secondary | ICD-10-CM | POA: Diagnosis not present

## 2012-07-26 DIAGNOSIS — N289 Disorder of kidney and ureter, unspecified: Secondary | ICD-10-CM | POA: Diagnosis not present

## 2012-07-26 LAB — CBC WITH DIFFERENTIAL/PLATELET
Basophils Absolute: 0 K/uL (ref 0.0–0.1)
Basophils Relative: 0 % (ref 0–1)
Eosinophils Absolute: 0.2 K/uL (ref 0.0–0.7)
Eosinophils Relative: 2 % (ref 0–5)
HCT: 37.4 % — ABNORMAL LOW (ref 39.0–52.0)
Hemoglobin: 12.8 g/dL — ABNORMAL LOW (ref 13.0–17.0)
Lymphocytes Relative: 15 % (ref 12–46)
Lymphs Abs: 1.6 K/uL (ref 0.7–4.0)
MCH: 31.4 pg (ref 26.0–34.0)
MCHC: 34.2 g/dL (ref 30.0–36.0)
MCV: 91.9 fL (ref 78.0–100.0)
Monocytes Absolute: 1.1 K/uL — ABNORMAL HIGH (ref 0.1–1.0)
Monocytes Relative: 10 % (ref 3–12)
Neutro Abs: 8.2 K/uL — ABNORMAL HIGH (ref 1.7–7.7)
Neutrophils Relative %: 73 % (ref 43–77)
Platelets: 248 K/uL (ref 150–400)
RBC: 4.07 MIL/uL — ABNORMAL LOW (ref 4.22–5.81)
RDW: 13.6 % (ref 11.5–15.5)
WBC: 11.2 K/uL — ABNORMAL HIGH (ref 4.0–10.5)

## 2012-07-26 LAB — BASIC METABOLIC PANEL WITH GFR
BUN: 26 mg/dL — ABNORMAL HIGH (ref 6–23)
CO2: 27 meq/L (ref 19–32)
Calcium: 8.6 mg/dL (ref 8.4–10.5)
Chloride: 104 meq/L (ref 96–112)
Creat: 1.83 mg/dL — ABNORMAL HIGH (ref 0.50–1.35)
Glucose, Bld: 90 mg/dL (ref 70–99)
Potassium: 4.6 meq/L (ref 3.5–5.3)
Sodium: 140 meq/L (ref 135–145)

## 2012-07-26 MED ORDER — CYANOCOBALAMIN 1000 MCG/ML IJ SOLN
1000.0000 ug | Freq: Once | INTRAMUSCULAR | Status: AC
  Administered 2012-07-26: 1000 ug via INTRAMUSCULAR

## 2012-07-26 NOTE — Progress Notes (Signed)
  Subjective:    Patient ID: Mark Jackson., male    DOB: Jan 09, 1930, 78 y.o.   MRN: [id number]  HPI problem #1 low B12 level. He gets his injections every month and has done well with this. His last hemoglobin was normal. Problem #2 carotid stenosis. He is followed regularly by the cardiologist for this problem. He tried to take Plavix but caused too much problems with nosebleeds and bruising and so he is currently on aspirin alone. Problem #3 renal disease. He sees the nephrologist regular for this. Problem number 4 hypertension. He exercises on daily basis and takes his blood pressure medication accordingly. Problem #5 high cholesterol is currently under treatment with crestor. Problem #6 carotid stenosis. This is followed closely by Dr. Gwenlyn Found he has routine ultrasounds every 6 months and is due the first of the year to have another study done .    Review of Systems     Objective:   Physical Exam patient is alert and cooperative. There are no carotid bruits heard. Neck is supple. Chest is clear to both auscultation and percussion. Cardiac exam is regular rate and rhythm. Extremity exam reveals 2+ edema        Assessment & Plan:  Patient has actually cut back on his Lasix because he thought it was giving him headaches. I encouraged him to watch his salt and wear support stockings. I did not making changes in his medications at the present time. The patient stopped his Plavix and Dr. Gwenlyn Found  is aware of this change. He met was done today and I will forward the results of this to his nephrologist. B12 was given today

## 2012-08-05 DIAGNOSIS — M5137 Other intervertebral disc degeneration, lumbosacral region: Secondary | ICD-10-CM | POA: Diagnosis not present

## 2012-08-24 ENCOUNTER — Ambulatory Visit
Admission: RE | Admit: 2012-08-24 | Discharge: 2012-08-24 | Disposition: A | Discharge status: Home / Self Care | Payer: Medicare Other | Source: Ambulatory Visit | Attending: Emergency Medicine | Admitting: Emergency Medicine

## 2012-08-24 ENCOUNTER — Ambulatory Visit (INDEPENDENT_AMBULATORY_CARE_PROVIDER_SITE_OTHER): Payer: Medicare Other | Admitting: Emergency Medicine

## 2012-08-24 VITALS — BP 152/100 | HR 81 | Resp 18 | Ht 69.0 in | Wt 209.0 lb

## 2012-08-24 DIAGNOSIS — R51 Headache: Secondary | ICD-10-CM

## 2012-08-24 DIAGNOSIS — E611 Iron deficiency: Secondary | ICD-10-CM

## 2012-08-24 DIAGNOSIS — D509 Iron deficiency anemia, unspecified: Secondary | ICD-10-CM | POA: Diagnosis not present

## 2012-08-24 DIAGNOSIS — G930 Cerebral cysts: Secondary | ICD-10-CM | POA: Diagnosis not present

## 2012-08-24 DIAGNOSIS — E538 Deficiency of other specified B group vitamins: Secondary | ICD-10-CM

## 2012-08-24 LAB — POCT CBC
Granulocyte percent: 80 %G (ref 37–80)
HCT, POC: 45.7 % (ref 43.5–53.7)
Hemoglobin: 14.3 g/dL (ref 14.1–18.1)
MCV: 98.9 fL — AB (ref 80–97)
POC Granulocyte: 11 — AB (ref 2–6.9)
POC LYMPH PERCENT: 13.7 %L (ref 10–50)
RDW, POC: 13.8 %

## 2012-08-24 LAB — POCT SEDIMENTATION RATE: POCT SED RATE: 30 mm/hr — AB (ref 0–22)

## 2012-08-24 MED ORDER — CYANOCOBALAMIN 1000 MCG/ML IJ SOLN
1000.0000 ug | Freq: Once | INTRAMUSCULAR | Status: AC
  Administered 2012-08-24: 1000 ug via INTRAMUSCULAR

## 2012-08-24 NOTE — Patient Instructions (Addendum)
Please proceed to [facility] for your CT. An appointment is being made with the neurologist.

## 2012-08-24 NOTE — Progress Notes (Signed)
  Subjective:    Patient ID: Mark Jackson., male    DOB: 05/25/1930, 79 y.o.   MRN: [id number]  HPI Pt wakes up early every morning with bad headache. No numbness in extremities. He takes an aspirin or sudafed. It moves from his left eye to his right eye. He has no vision issues. This has been going on for 5-6 months. The headaches last for hours, even after taking tylenol. When he had a syncopal spell back in 2005, he was seen by Dr Erling Cruz patient would like to see Dr. Sima Matas a neurologist in [place] for his headaches these headaches awaken him at night. He's had nausea at times he did appear to be primarily frontal in origin. He's also had difficulty sleeping..    Review of Systems     Objective:   Physical Exam patient is alert and cooperative does not appear in any distress. His pupils are equal and reactive to light. Cranial nerves II through XII are intact . I did not hear carotid bruit. His chest was clear. He has no focal weakness of the arms or legs. Deep tender reflexes upper extremities are 2+ no knee reflexes were elicited when I stand him up and do a Romberg he does appear to be somewhat unstable and when he walks he has a wide-based gait. CT scan done 1-1/2 years ago was read as age-related atrophy  Results for orders placed in visit on 08/24/12  POCT CBC      Component Value Range   WBC 13.8 (*) 4.6 - 10.2 K/uL   Lymph, poc 1.9  0.6 - 3.4   POC LYMPH PERCENT 13.7  10 - 50 %L   MID (cbc) 0.9  0 - 0.9   POC MID % 6.3  0 - 12 %M   POC Granulocyte 11.0 (*) 2 - 6.9   Granulocyte percent 80.0  37 - 80 %G   RBC 4.62 (*) 4.69 - 6.13 M/uL   Hemoglobin 14.3  14.1 - 18.1 g/dL   HCT, POC 45.7  43.5 - 53.7 %   MCV 98.9 (*) 80 - 97 fL   MCH, POC 31.0  27 - 31.2 pg   MCHC 31.3 (*) 31.8 - 35.4 g/dL   RDW, POC 13.8     Platelet Count, POC 284  142 - 424 K/uL   MPV 8.8  0 - 99.8 fL        Assessment & Plan:  Proceed with a stat CT referral made to Dr. Amil Amen a  neurologist his white count is significantly elevated his sedimentation rate is pending. CT done showed no acute changes. There is a small arachnoid cyst. I do not have a reason for his elevated white count. I did not place him on antibiotics.

## 2012-09-01 ENCOUNTER — Other Ambulatory Visit: Payer: Self-pay | Admitting: Physician Assistant

## 2012-09-01 ENCOUNTER — Telehealth: Payer: Self-pay

## 2012-09-01 NOTE — Telephone Encounter (Signed)
Faxed

## 2012-09-01 NOTE — Telephone Encounter (Signed)
PT STATES HE HAD A SCAN DONE AND WOULD LIKE [place] TO FAX THE RESULTS TO THE [facility] IN [place] TO THE ATTN OF DR [facility] THE FAX IS [phone] AND YOU MAY REACH PT AT [phone] IF NEEDED

## 2012-09-03 ENCOUNTER — Other Ambulatory Visit (HOSPITAL_COMMUNITY): Payer: Medicare Other

## 2012-09-03 ENCOUNTER — Encounter (HOSPITAL_COMMUNITY): Payer: Self-pay | Admitting: Nurse Practitioner

## 2012-09-03 ENCOUNTER — Emergency Department (HOSPITAL_COMMUNITY): Payer: Medicare Other

## 2012-09-03 ENCOUNTER — Ambulatory Visit (INDEPENDENT_AMBULATORY_CARE_PROVIDER_SITE_OTHER): Payer: Medicare Other | Admitting: Family Medicine

## 2012-09-03 ENCOUNTER — Emergency Department (HOSPITAL_COMMUNITY)
Admission: EM | Admit: 2012-09-03 | Discharge: 2012-09-03 | Disposition: A | Discharge status: Home / Self Care | Payer: Medicare Other | Attending: Emergency Medicine | Admitting: Emergency Medicine

## 2012-09-03 VITALS — BP 191/123 | HR 85 | Temp 97.7°F | Resp 18 | Ht 69.5 in | Wt 210.0 lb

## 2012-09-03 DIAGNOSIS — H9319 Tinnitus, unspecified ear: Secondary | ICD-10-CM | POA: Diagnosis not present

## 2012-09-03 DIAGNOSIS — R2681 Unsteadiness on feet: Secondary | ICD-10-CM

## 2012-09-03 DIAGNOSIS — R51 Headache: Secondary | ICD-10-CM | POA: Diagnosis not present

## 2012-09-03 DIAGNOSIS — R112 Nausea with vomiting, unspecified: Secondary | ICD-10-CM | POA: Diagnosis not present

## 2012-09-03 DIAGNOSIS — Z8546 Personal history of malignant neoplasm of prostate: Secondary | ICD-10-CM | POA: Diagnosis not present

## 2012-09-03 DIAGNOSIS — I10 Essential (primary) hypertension: Secondary | ICD-10-CM

## 2012-09-03 DIAGNOSIS — Z7982 Long term (current) use of aspirin: Secondary | ICD-10-CM | POA: Insufficient documentation

## 2012-09-03 DIAGNOSIS — R3915 Urgency of urination: Secondary | ICD-10-CM | POA: Diagnosis not present

## 2012-09-03 DIAGNOSIS — Z79899 Other long term (current) drug therapy: Secondary | ICD-10-CM | POA: Diagnosis not present

## 2012-09-03 DIAGNOSIS — Z7901 Long term (current) use of anticoagulants: Secondary | ICD-10-CM | POA: Diagnosis not present

## 2012-09-03 DIAGNOSIS — I509 Heart failure, unspecified: Secondary | ICD-10-CM | POA: Diagnosis not present

## 2012-09-03 DIAGNOSIS — D649 Anemia, unspecified: Secondary | ICD-10-CM | POA: Diagnosis not present

## 2012-09-03 DIAGNOSIS — Z87891 Personal history of nicotine dependence: Secondary | ICD-10-CM | POA: Diagnosis not present

## 2012-09-03 DIAGNOSIS — R5381 Other malaise: Secondary | ICD-10-CM | POA: Insufficient documentation

## 2012-09-03 DIAGNOSIS — R5383 Other fatigue: Secondary | ICD-10-CM | POA: Insufficient documentation

## 2012-09-03 DIAGNOSIS — R269 Unspecified abnormalities of gait and mobility: Secondary | ICD-10-CM

## 2012-09-03 MED ORDER — LABETALOL HCL 5 MG/ML IV SOLN
20.0000 mg | Freq: Once | INTRAVENOUS | Status: AC
  Administered 2012-09-03: 20 mg via INTRAVENOUS
  Filled 2012-09-03: qty 4

## 2012-09-03 MED ORDER — METOCLOPRAMIDE HCL 5 MG/ML IJ SOLN
10.0000 mg | Freq: Once | INTRAMUSCULAR | Status: AC
  Administered 2012-09-03: 10 mg via INTRAVENOUS
  Filled 2012-09-03: qty 2

## 2012-09-03 MED ORDER — METOCLOPRAMIDE HCL 10 MG PO TABS: 10.0000 mg | ORAL_TABLET | Freq: Four times a day (QID) | ORAL | Status: DC

## 2012-09-03 MED ORDER — DIPHENHYDRAMINE HCL 50 MG/ML IJ SOLN
25.0000 mg | Freq: Once | INTRAMUSCULAR | Status: AC
  Administered 2012-09-03: 25 mg via INTRAVENOUS
  Filled 2012-09-03: qty 1

## 2012-09-03 MED ORDER — MORPHINE SULFATE 4 MG/ML IJ SOLN
2.0000 mg | Freq: Once | INTRAMUSCULAR | Status: AC
  Administered 2012-09-03: 2 mg via INTRAVENOUS
  Filled 2012-09-03: qty 1

## 2012-09-03 NOTE — ED Notes (Signed)
Patient transported to CT 

## 2012-09-03 NOTE — Progress Notes (Signed)
Subjective:    Patient ID: Mark Harris., male    DOB: [birth year], 79 y.o.   MRN: [id number]  HPI Mark Basta. is a 79 y.o. male Usual pt of Dr. Everlene Farrier.  complicated history including renal dz, htn, carotid aa. stenosis, b12 deficiency/anemia.  Seen 10 days ago for headaches - morning, frontal, for 5-6 months, with nausea. CT head 08/24/12:IMPRESSION: Atrophy and small vessel disease. No acute intracranial  abnormality. Stable left middle cranial fossa arachnoid cyst. Referred to Dr Sima Matas  - Neurologist at [facility] in [place].  Appt with Dr. Sima Matas - 09/13/12.  13 headaches this month.  Has every other day.  Headache worse this morning. Not usually vomiting with headaches, but 4-5 episodes this morning of emesis.  Headache onset at 1:30 am. Similar HA until 6 am, then worsened. Vomiting x 5 since 9am. Frontal headache - pounding into R eye.  No focal weakness this am.  No facial droop. No slurred speech. No syncope. No chest pain, no abd pain.  No diarrhea.  Last emesis at 9:15 am.   Tx: tylenol and sudafed - took - one of daughters sumatriptan 100mg  and 1 ketoprofen - vomited up less than a minute.   Blood pressure usually 140/90, 130's. Usually takes BP meds at breakfast around 6 am - unable to eat this morning, so did not take BP meds yet.          Review of Systems  Constitutional: Negative for fever and chills.  Respiratory: Negative for shortness of breath.   Cardiovascular: Negative for chest pain.  Neurological: Positive for headaches. Negative for seizures, syncope, speech difficulty, weakness and numbness.       Does feel less steady this morning, but no focal weakness or slurred speech.  No fall, no syncope.        Objective:   Physical Exam  Constitutional: He is oriented to person, place, and time. He appears well-developed and well-nourished.  HENT:  Head: Normocephalic and atraumatic.  Eyes: EOM are normal. Pupils are equal, round, and  reactive to light. Right eye exhibits no nystagmus. Left eye exhibits no nystagmus.  Neck: No JVD present. Carotid bruit is not present.  Cardiovascular: Normal rate and regular rhythm.   Murmur heard.  Systolic murmur is present with a grade of 2/6  Pulmonary/Chest: Effort normal and breath sounds normal. He has no rales.  Musculoskeletal: He exhibits no edema.  Neurological: He is alert and oriented to person, place, and time. He has normal strength. He displays no tremor. No cranial nerve deficit or sensory deficit. Gait abnormal.       Slightly unsteady Romberg, but no true fall to side.  Unsteady with heel to toe, no pronator drift.   Skin: Skin is warm and dry.  Psychiatric: He has a normal mood and affect. His behavior is normal. Judgment and thought content normal.       Assessment & Plan:  Mark Walters. is a 79 y.o. male 1. Headache   2. Nausea & vomiting   3. HTN (hypertension)   4. Unsteadiness    History of recurrent headache, recent CT head reviewed and has follow up with neurology in about a week, but increase in headache this morning with associated vomiting which is new for him.  No focal weakness appreciated on exam but subjectively increased unsteadiness today. Will have evaluated at emergency room this am - ddx migraine, less likely aneurysm or CVA.  Discussed  EMS transport, but declined, and family present to drive him there now.  Charge nurse advised.  911 precautions en route discussed.   HTN - uncontrolled. Elevation likely multifactorial - pain, vomiting, off meds and use of sudafed.  eval in ER as above.   Patient Instructions  Go to the emergency room at [facility] as soon as you leave here for evaluation of your headache, vomiting, elevated blood pressure and feelings of unsteadiness this morning.  If any changes on your way there - call 911, or if your symptoms change while waiting to be seen - advise the staff in the emergency room.

## 2012-09-03 NOTE — ED Notes (Addendum)
Pt reports significant history of headaches but over past month he has been having more headaches and they are more severe. Reports he woke this am at 0130 to use the bathroom and has had a severe headache he can not get rid of since. Pt is HTN today, reports a history of hypertension and did not take his meds this am because he started vomiting at 0900. Pt went to an urgent care and they urged him to come to ER for eval. No cp or sob

## 2012-09-03 NOTE — Patient Instructions (Signed)
Go to the emergency room at [facility] as soon as you leave here for evaluation of your headache, vomiting, elevated blood pressure and feelings of unsteadiness this morning.  If any changes on your way there - call 911, or if your symptoms change while waiting to be seen - advise the staff in the emergency room.

## 2012-09-03 NOTE — ED Provider Notes (Signed)
History     CSN: [id number]  Arrival date & time 09/03/12  1131   First MD Initiated Contact with Patient 09/03/12 1146      Chief Complaint  Patient presents with  . Hypertension    (Consider location/radiation/quality/duration/timing/severity/associated sxs/prior treatment) HPI Comments: [name]. Presents with his wife and daughter for evaluation.  He reports he has had headaches for quite some time.  Over the last month it has become a near daily event.  He describes severe frontal, throbbing pain that sometimes migrates behind his left eye.  The pain was so persistent today, he took some of his daughter's headache medicine (sumatriptan and an NSAID).  He was already nauseated and immediately after the taking the pills, he vomited.  He denies fever or neck pain.  He had an outpt CT scan 3 weeks ago and has an upcoming appt with a headache specialist.  Patient is a 79 y.o. male presenting with headaches. The history is provided by the patient. No language interpreter was used.  Headache  This is a recurrent problem. The current episode started 3 to 5 hours ago. The problem occurs constantly. The problem has not changed since onset.The headache is associated with nothing. The pain is located in the frontal region. The quality of the pain is described as throbbing. The pain is at a severity of 9/10. The pain is severe. The pain does not radiate. Associated symptoms include nausea and vomiting. Pertinent negatives include no fever, no palpitations and no shortness of breath. He has tried ergotamines for the symptoms.    Past Medical History  Diagnosis Date  . Hypertension   . Prostate ca   . CHF (congestive heart failure)   . Anemia     History reviewed. No pertinent past surgical history.  Family History  Problem Relation Age of Onset  . Cancer Mother     ovarin cancer    History  Substance Use Topics  . Smoking status: Former Smoker    Types: Pipe, Landscape architect  . Smokeless  tobacco: Not on file  . Alcohol Use: No      Review of Systems  Constitutional: Positive for fatigue. Negative for fever, chills, activity change and appetite change.  HENT: Positive for tinnitus (chronic). Negative for nosebleeds, congestion, sore throat, rhinorrhea, drooling, trouble swallowing, neck pain, neck stiffness, dental problem, voice change and postnasal drip.   Eyes: Negative for pain, redness and visual disturbance.  Respiratory: Negative for cough, chest tightness and shortness of breath.   Cardiovascular: Negative for chest pain, palpitations and leg swelling.  Gastrointestinal: Positive for nausea and vomiting. Negative for abdominal pain, diarrhea and constipation.  Genitourinary: Positive for urgency (chronic). Negative for hematuria.  Musculoskeletal: Negative for back pain, arthralgias and gait problem.  Skin: Negative.   Neurological: Positive for headaches. Negative for dizziness, tremors, syncope, facial asymmetry, speech difficulty, weakness, light-headedness and numbness.  Hematological: Does not bruise/bleed easily.    Allergies  Review of patient's allergies indicates no known allergies.  Home Medications   Current Outpatient Rx  Name  Route  Sig  Dispense  Refill  . ASPIRIN 81 MG PO TABS   Oral   Take 81 mg by mouth daily.         Marland Kitchen BIOFLEX PO   Oral   Take 1 tablet by mouth daily.         Marland Kitchen CLOPIDOGREL BISULFATE 75 MG PO TABS   Oral   Take 75 mg by mouth daily.         Marland Kitchen  CYANOCOBALAMIN 100 MCG PO TABS   Oral   Take 100 mcg by mouth daily.         Marland Kitchen DIPHENHYDRAMINE HCL (SLEEP) 25 MG PO TABS   Oral   Take 25 mg by mouth at bedtime as needed. For sleep         . FERROUS FUMARATE 325 (106 FE) MG PO TABS   Oral   Take 1 tablet by mouth.         . OMEGA-3 FATTY ACIDS 1000 MG PO CAPS   Oral   Take 2 g by mouth daily.         . FUROSEMIDE 40 MG PO TABS   Oral   Take 40 mg by mouth daily.         Marland Kitchen LOSARTAN POTASSIUM 50  MG PO TABS      TAKE 1 TABLET EACH DAY.   90 tablet   1   . ROSUVASTATIN CALCIUM 10 MG PO TABS   Oral   Take 10 mg by mouth daily.           BP 196/114  Pulse 85  Temp 97.9 F (36.6 C) (Oral)  Resp 18  SpO2 98%  Physical Exam  Nursing note and vitals reviewed. Constitutional: He is oriented to person, place, and time. He appears well-developed and well-nourished. No distress.  HENT:  Head: Normocephalic and atraumatic.  Right Ear: External ear normal.  Left Ear: External ear normal.  Nose: Nose normal.  Mouth/Throat: Oropharynx is clear and moist.  Eyes: Conjunctivae normal and EOM are normal. Pupils are equal, round, and reactive to light. Right eye exhibits no discharge. Left eye exhibits no discharge. No scleral icterus.  Neck: Normal range of motion. Neck supple. No JVD present. No tracheal deviation present.  Cardiovascular: Normal rate, regular rhythm, normal heart sounds and intact distal pulses.   Pulmonary/Chest: Effort normal and breath sounds normal. No stridor. No respiratory distress. He has no wheezes. He has no rales. He exhibits no tenderness.  Abdominal: Soft. Bowel sounds are normal. He exhibits no distension and no mass. There is no tenderness. There is no rebound and no guarding.  Musculoskeletal: Normal range of motion. He exhibits no edema and no tenderness.  Lymphadenopathy:    He has no cervical adenopathy.  Neurological: He is alert and oriented to person, place, and time. No cranial nerve deficit. He exhibits normal muscle tone.  Skin: Skin is warm and dry. No rash noted. He is not diaphoretic. No erythema. No pallor.  Psychiatric: He has a normal mood and affect. His behavior is normal.    ED Course  Procedures (including critical care time)  Labs Reviewed - No data to display No results found.   No diagnosis found.    MDM  Pt presents for evaluation of a recurrent headache.  He appears uncomfortable, afebrile, nontoxic.  Note elevated  BP, NAD.  He has no focal neurologic deficits.  His voice and speech are clear.  He had a CT scan 3 weeks ago for evaluation of similar HAs as an outpt.  He was seen by a provider at his PMD's office today and sent for further evaluation.  Will administer  1540.  Administered Iv labetalol, reglan, morphine, and benadryl.  The pain is completely resolved.  The CT demonstrates no evidence of ICH or mass.  He has no focal deficits or evidence of meningitis.  Plan d/c home.  Pt has been encouraged to follow-up with his PMD and  the headache specialist.     Perlie Mayo, MD 09/03/12 [id number]

## 2012-09-06 DIAGNOSIS — H35379 Puckering of macula, unspecified eye: Secondary | ICD-10-CM | POA: Diagnosis not present

## 2012-09-13 DIAGNOSIS — R0609 Other forms of dyspnea: Secondary | ICD-10-CM | POA: Diagnosis not present

## 2012-09-13 DIAGNOSIS — R011 Cardiac murmur, unspecified: Secondary | ICD-10-CM | POA: Diagnosis not present

## 2012-09-13 DIAGNOSIS — G43009 Migraine without aura, not intractable, without status migrainosus: Secondary | ICD-10-CM | POA: Diagnosis not present

## 2012-09-15 ENCOUNTER — Telehealth: Payer: Self-pay | Admitting: Radiology

## 2012-09-15 NOTE — Telephone Encounter (Signed)
We received correspondence from neurology at [facility] in [place] today. Patient was worked up and murmur found on exam, they recommended patient be evaluated by cardiology. I have called and left voice mail with the nurse for Dr Sima Matas to advise patient does already have a cardiologist at [facility]. Advised if they need anything else with this to please call me back to advise.

## 2012-09-28 ENCOUNTER — Ambulatory Visit: Payer: Medicare Other | Admitting: Family Medicine

## 2012-09-28 ENCOUNTER — Telehealth: Payer: Self-pay | Admitting: Family Medicine

## 2012-09-28 DIAGNOSIS — E782 Mixed hyperlipidemia: Secondary | ICD-10-CM | POA: Diagnosis not present

## 2012-09-28 DIAGNOSIS — E539 Vitamin B deficiency, unspecified: Secondary | ICD-10-CM

## 2012-09-28 DIAGNOSIS — Z79899 Other long term (current) drug therapy: Secondary | ICD-10-CM | POA: Diagnosis not present

## 2012-09-28 DIAGNOSIS — E538 Deficiency of other specified B group vitamins: Secondary | ICD-10-CM

## 2012-09-28 NOTE — Telephone Encounter (Signed)
Pt presented to office on 09/28/12 for B12 injection. No order in the computer or frequency/amount of injections is indicated. Pt will discuss this with you at his next appointment in April, unless you need to see him before then. I advised pt that he needs his levels checked as the last reading was high?

## 2012-09-28 NOTE — Progress Notes (Signed)
  Subjective:    Patient ID: Mark Jackson., male    DOB: 08-24-1929, 79 y.o.   MRN: [id number]  HPI  Pt presented for B12 injection today. There was not an order in the chart for this and labs show high levels? of B12. Pt will discuss this with Dr. Everlene Farrier at next appointment.  Review of Systems     Objective:   Physical Exam        Assessment & Plan:

## 2012-09-29 NOTE — Telephone Encounter (Signed)
Yes hold on the injection until his appt in April so a level can be checked.

## 2012-09-30 DIAGNOSIS — R011 Cardiac murmur, unspecified: Secondary | ICD-10-CM | POA: Diagnosis not present

## 2012-09-30 DIAGNOSIS — I10 Essential (primary) hypertension: Secondary | ICD-10-CM | POA: Diagnosis not present

## 2012-09-30 DIAGNOSIS — R229 Localized swelling, mass and lump, unspecified: Secondary | ICD-10-CM | POA: Diagnosis not present

## 2012-10-03 ENCOUNTER — Other Ambulatory Visit (HOSPITAL_COMMUNITY): Payer: Self-pay | Admitting: Cardiology

## 2012-10-03 DIAGNOSIS — I10 Essential (primary) hypertension: Secondary | ICD-10-CM

## 2012-10-03 DIAGNOSIS — R0989 Other specified symptoms and signs involving the circulatory and respiratory systems: Secondary | ICD-10-CM

## 2012-10-03 DIAGNOSIS — D551 Anemia due to other disorders of glutathione metabolism: Secondary | ICD-10-CM

## 2012-10-03 DIAGNOSIS — I779 Disorder of arteries and arterioles, unspecified: Secondary | ICD-10-CM

## 2012-10-03 DIAGNOSIS — I358 Other nonrheumatic aortic valve disorders: Secondary | ICD-10-CM

## 2012-10-10 DIAGNOSIS — L570 Actinic keratosis: Secondary | ICD-10-CM | POA: Diagnosis not present

## 2012-10-10 DIAGNOSIS — Q828 Other specified congenital malformations of skin: Secondary | ICD-10-CM | POA: Diagnosis not present

## 2012-10-10 DIAGNOSIS — L821 Other seborrheic keratosis: Secondary | ICD-10-CM | POA: Diagnosis not present

## 2012-10-10 DIAGNOSIS — D2370 Other benign neoplasm of skin of unspecified lower limb, including hip: Secondary | ICD-10-CM | POA: Diagnosis not present

## 2012-10-10 DIAGNOSIS — D235 Other benign neoplasm of skin of trunk: Secondary | ICD-10-CM | POA: Diagnosis not present

## 2012-10-21 ENCOUNTER — Ambulatory Visit (HOSPITAL_COMMUNITY)
Admission: RE | Admit: 2012-10-21 | Discharge: 2012-10-21 | Disposition: A | Discharge status: Home / Self Care | Payer: Medicare Other | Source: Ambulatory Visit | Attending: Cardiovascular Disease | Admitting: Cardiovascular Disease

## 2012-10-21 ENCOUNTER — Ambulatory Visit (HOSPITAL_COMMUNITY)
Admission: RE | Admit: 2012-10-21 | Discharge: 2012-10-21 | Disposition: A | Discharge status: Home / Self Care | Payer: Medicare Other | Source: Ambulatory Visit | Attending: Cardiology | Admitting: Cardiology

## 2012-10-21 DIAGNOSIS — D649 Anemia, unspecified: Secondary | ICD-10-CM | POA: Insufficient documentation

## 2012-10-21 DIAGNOSIS — R011 Cardiac murmur, unspecified: Secondary | ICD-10-CM | POA: Diagnosis not present

## 2012-10-21 DIAGNOSIS — I6529 Occlusion and stenosis of unspecified carotid artery: Secondary | ICD-10-CM | POA: Diagnosis not present

## 2012-10-21 DIAGNOSIS — E8889 Other specified metabolic disorders: Secondary | ICD-10-CM | POA: Diagnosis not present

## 2012-10-21 DIAGNOSIS — I10 Essential (primary) hypertension: Secondary | ICD-10-CM | POA: Insufficient documentation

## 2012-10-21 DIAGNOSIS — D551 Anemia due to other disorders of glutathione metabolism: Secondary | ICD-10-CM

## 2012-10-21 DIAGNOSIS — I779 Disorder of arteries and arterioles, unspecified: Secondary | ICD-10-CM | POA: Diagnosis not present

## 2012-10-21 DIAGNOSIS — R0989 Other specified symptoms and signs involving the circulatory and respiratory systems: Secondary | ICD-10-CM | POA: Insufficient documentation

## 2012-10-21 DIAGNOSIS — I358 Other nonrheumatic aortic valve disorders: Secondary | ICD-10-CM

## 2012-10-21 HISTORY — PX: OTHER SURGICAL HISTORY: SHX169

## 2012-10-21 NOTE — Progress Notes (Signed)
2 D echocardiogram with bubble study completed.  Jamison Neighbor, RDCS

## 2012-10-21 NOTE — Progress Notes (Signed)
Carotid Duplex Completed. Sturdivant, Rita D  

## 2012-10-26 DIAGNOSIS — G43009 Migraine without aura, not intractable, without status migrainosus: Secondary | ICD-10-CM | POA: Diagnosis not present

## 2012-11-29 ENCOUNTER — Ambulatory Visit: Payer: Medicare Other | Admitting: Emergency Medicine

## 2012-12-13 ENCOUNTER — Encounter: Payer: Self-pay | Admitting: Emergency Medicine

## 2012-12-13 ENCOUNTER — Ambulatory Visit (INDEPENDENT_AMBULATORY_CARE_PROVIDER_SITE_OTHER): Payer: Medicare Other | Admitting: Emergency Medicine

## 2012-12-13 VITALS — BP 140/86 | HR 63 | Temp 97.8°F | Resp 16 | Ht 68.0 in | Wt 207.8 lb

## 2012-12-13 DIAGNOSIS — E538 Deficiency of other specified B group vitamins: Secondary | ICD-10-CM

## 2012-12-13 DIAGNOSIS — I10 Essential (primary) hypertension: Secondary | ICD-10-CM | POA: Diagnosis not present

## 2012-12-13 DIAGNOSIS — E785 Hyperlipidemia, unspecified: Secondary | ICD-10-CM

## 2012-12-13 LAB — CBC WITH DIFFERENTIAL/PLATELET
Basophils Absolute: 0.1 10*3/uL (ref 0.0–0.1)
Basophils Relative: 0 % (ref 0–1)
Eosinophils Absolute: 0.4 10*3/uL (ref 0.0–0.7)
Eosinophils Relative: 3 % (ref 0–5)
HCT: 38.5 % — ABNORMAL LOW (ref 39.0–52.0)
MCH: 31.5 pg (ref 26.0–34.0)
MCHC: 33.2 g/dL (ref 30.0–36.0)
MCV: 94.8 fL (ref 78.0–100.0)
Monocytes Absolute: 1.1 10*3/uL — ABNORMAL HIGH (ref 0.1–1.0)
RDW: 13.5 % (ref 11.5–15.5)

## 2012-12-13 LAB — VITAMIN B12: Vitamin B-12: 322 pg/mL (ref 211–911)

## 2012-12-13 MED ORDER — CYANOCOBALAMIN 1000 MCG/ML IJ SOLN
1000.0000 ug | Freq: Once | INTRAMUSCULAR | Status: AC
  Administered 2012-12-13: 1000 ug via INTRAMUSCULAR

## 2012-12-13 NOTE — Progress Notes (Signed)
  Subjective:    Patient ID: Mark Jackson., male    DOB: [birth year], 79 y.o.   MRN: [id number]  HPI patient in for follow up. He is doing well except for fatigue. This occurs primarily in the morning and resolves after he takes a nap. He sees his urologist cardiologist and cardiovascular surgeon regular.    Review of Systems     Objective:   Physical Exam alert and cooperative and in no distress. Chest exam is clear cardiac exam reveals a 2/6 systolic murmur at the left sternal border. Extremities are without edema .        Assessment & Plan:  Routine labs were done today. He was given a shot of B12. Levels were done since he has not had a B12 shot since January.

## 2012-12-14 LAB — COMPREHENSIVE METABOLIC PANEL
ALT: 12 U/L (ref 0–53)
AST: 19 U/L (ref 0–37)
Calcium: 8.7 mg/dL (ref 8.4–10.5)
Chloride: 110 mEq/L (ref 96–112)
Creat: 1.82 mg/dL — ABNORMAL HIGH (ref 0.50–1.35)
Total Bilirubin: 0.7 mg/dL (ref 0.3–1.2)

## 2012-12-15 LAB — METHYLMALONIC ACID, SERUM: Methylmalonic Acid, Quant: 0.4 umol/L — ABNORMAL HIGH (ref <0.40)

## 2013-01-17 ENCOUNTER — Ambulatory Visit (INDEPENDENT_AMBULATORY_CARE_PROVIDER_SITE_OTHER): Payer: Medicare Other | Admitting: Family Medicine

## 2013-01-17 DIAGNOSIS — E538 Deficiency of other specified B group vitamins: Secondary | ICD-10-CM | POA: Diagnosis not present

## 2013-01-17 MED ORDER — CYANOCOBALAMIN 1000 MCG/ML IJ SOLN
1000.0000 ug | Freq: Once | INTRAMUSCULAR | Status: AC
  Administered 2013-01-17: 1000 ug via INTRAMUSCULAR

## 2013-02-02 ENCOUNTER — Other Ambulatory Visit (HOSPITAL_COMMUNITY): Payer: Self-pay | Admitting: *Deleted

## 2013-02-02 ENCOUNTER — Other Ambulatory Visit (HOSPITAL_COMMUNITY): Payer: Self-pay | Admitting: Cardiovascular Disease

## 2013-02-08 ENCOUNTER — Other Ambulatory Visit (HOSPITAL_COMMUNITY): Payer: Self-pay | Admitting: Cardiovascular Disease

## 2013-02-08 DIAGNOSIS — I739 Peripheral vascular disease, unspecified: Secondary | ICD-10-CM

## 2013-02-13 DIAGNOSIS — C61 Malignant neoplasm of prostate: Secondary | ICD-10-CM | POA: Diagnosis not present

## 2013-02-16 ENCOUNTER — Encounter: Payer: Self-pay | Admitting: *Deleted

## 2013-02-16 ENCOUNTER — Ambulatory Visit (INDEPENDENT_AMBULATORY_CARE_PROVIDER_SITE_OTHER): Payer: Medicare Other | Admitting: *Deleted

## 2013-02-16 VITALS — BP 155/70 | HR 67 | Temp 98.7°F

## 2013-02-16 DIAGNOSIS — E538 Deficiency of other specified B group vitamins: Secondary | ICD-10-CM | POA: Diagnosis not present

## 2013-02-16 MED ORDER — CYANOCOBALAMIN 1000 MCG/ML IJ SOLN
1000.0000 ug | Freq: Once | INTRAMUSCULAR | Status: AC
  Administered 2013-02-16: 1000 ug via INTRAMUSCULAR

## 2013-02-16 NOTE — Progress Notes (Signed)
  Subjective:    Patient ID: Mark Jackson., male    DOB: [birth year], 79 y.o.   MRN: [id number]  HPI    Review of Systems     Objective:   Physical Exam        Assessment & Plan:  Patient here for B 12 injection only.

## 2013-02-17 ENCOUNTER — Ambulatory Visit (HOSPITAL_COMMUNITY)
Admission: RE | Admit: 2013-02-17 | Discharge: 2013-02-17 | Disposition: A | Discharge status: Home / Self Care | Payer: Medicare Other | Source: Ambulatory Visit | Attending: Cardiovascular Disease | Admitting: Cardiovascular Disease

## 2013-02-17 DIAGNOSIS — R0989 Other specified symptoms and signs involving the circulatory and respiratory systems: Secondary | ICD-10-CM | POA: Diagnosis not present

## 2013-02-17 DIAGNOSIS — I739 Peripheral vascular disease, unspecified: Secondary | ICD-10-CM | POA: Diagnosis not present

## 2013-02-17 DIAGNOSIS — I779 Disorder of arteries and arterioles, unspecified: Secondary | ICD-10-CM | POA: Diagnosis not present

## 2013-02-17 NOTE — Progress Notes (Signed)
Carotid Duplex Completed. °Mark Jackson ° °

## 2013-02-20 DIAGNOSIS — C61 Malignant neoplasm of prostate: Secondary | ICD-10-CM | POA: Diagnosis not present

## 2013-02-27 ENCOUNTER — Encounter: Payer: Self-pay | Admitting: *Deleted

## 2013-02-27 ENCOUNTER — Telehealth: Payer: Self-pay | Admitting: *Deleted

## 2013-02-27 DIAGNOSIS — I6529 Occlusion and stenosis of unspecified carotid artery: Secondary | ICD-10-CM

## 2013-02-27 NOTE — Telephone Encounter (Signed)
Message copied by Chauncy Lean on Mon Feb 27, 2013  2:17 PM ------      Message from: Lorretta Harp      Created: Thu Feb 23, 2013 10:57 AM       No change from prior study. Repeat in 6 months ------

## 2013-02-28 DIAGNOSIS — G43009 Migraine without aura, not intractable, without status migrainosus: Secondary | ICD-10-CM | POA: Diagnosis not present

## 2013-03-02 ENCOUNTER — Ambulatory Visit (INDEPENDENT_AMBULATORY_CARE_PROVIDER_SITE_OTHER): Payer: Medicare Other | Admitting: Cardiovascular Disease

## 2013-03-02 ENCOUNTER — Encounter: Payer: Self-pay | Admitting: Cardiovascular Disease

## 2013-03-02 VITALS — BP 148/80 | HR 66 | Ht 69.0 in | Wt 206.0 lb

## 2013-03-02 DIAGNOSIS — E785 Hyperlipidemia, unspecified: Secondary | ICD-10-CM

## 2013-03-02 DIAGNOSIS — I10 Essential (primary) hypertension: Secondary | ICD-10-CM | POA: Diagnosis not present

## 2013-03-02 DIAGNOSIS — I779 Disorder of arteries and arterioles, unspecified: Secondary | ICD-10-CM | POA: Diagnosis not present

## 2013-03-02 NOTE — Assessment & Plan Note (Signed)
Patient has had serial carotid Doppler studies the most recent one shows no change in the severity of stenosis. He is on aspirin. We'll continue to follow him semiannually by Doppler Doppler. Should he progress he would be a candidate for endarterectomy. I will see him back in one year.

## 2013-03-02 NOTE — Patient Instructions (Addendum)
   We will see you back in follow up in 12 months with Dr Gwenlyn Found  Dr Gwenlyn Found has ordered carotid dopplers every 6 months

## 2013-03-02 NOTE — Progress Notes (Signed)
03/02/2013 Mark Patty Jr.   1929/11/20  [id number]  Primary Physician GUEST, Mark Melter, MD Primary Cardiologist: Lorretta Harp MD Mark Jackson   HPI:  The patient is a 79 year old mildly overweight married Caucasian male father of 73, grandfather to 77 grandchildren who is a patient of Dr. Allison Quarry and who I see for carotid disease. I saw him 6 months ago. His risk factors include hypertension and mild hyperlipidemia. He has moderately severe right ICA stenosis which has remained stable by duplex ultrasound over the last year. He was on aspirin and Plavix and discontinued the Plavix on his own because of bruising and bleeding. He has had a normal 2D echo and a negative Myoview back a year ago. Recent carotid Dopplers have shown stability of his right internal carotid artery. I last saw him he has been totally asymptomatic. Recent carotid Dopplers showed no evidence of progression. We'll continue to follow him noninvasively.      Current Outpatient Prescriptions  Medication Sig Dispense Refill  . aspirin 81 MG tablet Take 81 mg by mouth daily.      . baclofen (LIORESAL) 10 MG tablet Take 10 mg by mouth as needed.      Marland Kitchen Bioflavonoid Products (BIOFLEX PO) Take 1 tablet by mouth daily.      . cyanocobalamin 100 MCG tablet Take 100 mcg by mouth daily.      . diphenhydrAMINE (SOMINEX) 25 MG tablet Take 25 mg by mouth at bedtime as needed. For sleep      . ferrous fumarate (HEMOCYTE - 106 MG FE) 325 (106 FE) MG TABS Take 1 tablet by mouth.      . fish oil-omega-3 fatty acids 1000 MG capsule Take 2 g by mouth daily.      . furosemide (LASIX) 40 MG tablet Take 40 mg by mouth daily.      Marland Kitchen losartan (COZAAR) 50 MG tablet TAKE 1 TABLET EACH DAY.  90 tablet  1  . metoCLOPramide (REGLAN) 10 MG tablet Take 1 tablet (10 mg total) by mouth every 6 (six) hours.  12 tablet  0  . rosuvastatin (CRESTOR) 10 MG tablet Take 10 mg by mouth daily.      Marland Kitchen topiramate (TOPAMAX) 25 MG tablet  Take 25 mg by mouth 2 (two) times daily.       No current facility-administered medications for this visit.    No Known Allergies  History   Social History  . Marital Status: Married    Spouse Name: N/A    Number of Children: N/A  . Years of Education: N/A   Occupational History  . Not on file.   Social History Main Topics  . Smoking status: Former Smoker    Types: Pipe, Landscape architect  . Smokeless tobacco: Not on file  . Alcohol Use: No  . Drug Use: No  . Sexually Active: Not on file   Other Topics Concern  . Not on file   Social History Narrative  . No narrative on file     Review of Systems: General: negative for chills, fever, night sweats or weight changes.  Cardiovascular: negative for chest pain, dyspnea on exertion, edema, orthopnea, palpitations, paroxysmal nocturnal dyspnea or shortness of breath Dermatological: negative for rash Respiratory: negative for cough or wheezing Urologic: negative for hematuria Abdominal: negative for nausea, vomiting, diarrhea, bright red blood per rectum, melena, or hematemesis Neurologic: negative for visual changes, syncope, or dizziness All other systems reviewed and are otherwise negative except as noted  above.    Blood pressure 148/80, pulse 66, height 5\' 9"  (1.753 m), weight 206 lb (93.441 kg).  General appearance: alert and no distress Neck: no adenopathy, no carotid bruit, no JVD, supple, symmetrical, trachea midline and thyroid not enlarged, symmetric, no tenderness/mass/nodules Lungs: clear to auscultation bilaterally Heart: regular rate and rhythm, S1, S2 normal, no murmur, click, rub or gallop Extremities: extremities normal, atraumatic, no cyanosis or edema  EKG sinus bradycardia at 57 nonspecific ST and T-wave changes  ASSESSMENT AND PLAN:   Carotid artery disease Patient has had serial carotid Doppler studies the most recent one shows no change in the severity of stenosis. He is on aspirin. We'll continue to  follow him semiannually by Doppler Doppler. Should he progress he would be a candidate for endarterectomy. I will see him back in one year.      Lorretta Harp MD FACP,FACC,FAHA, [facility] 03/02/2013 10:06 AM

## 2013-03-15 ENCOUNTER — Other Ambulatory Visit: Payer: Self-pay | Admitting: Internal Medicine

## 2013-03-16 ENCOUNTER — Ambulatory Visit (INDEPENDENT_AMBULATORY_CARE_PROVIDER_SITE_OTHER): Payer: Medicare Other | Admitting: *Deleted

## 2013-03-16 DIAGNOSIS — E538 Deficiency of other specified B group vitamins: Secondary | ICD-10-CM | POA: Diagnosis not present

## 2013-03-16 MED ORDER — CYANOCOBALAMIN 1000 MCG/ML IJ SOLN
1000.0000 ug | Freq: Once | INTRAMUSCULAR | Status: AC
  Administered 2013-03-16: 1000 ug via INTRAMUSCULAR

## 2013-03-16 NOTE — Progress Notes (Signed)
  Subjective:    Patient ID: Mark Jackson., male    DOB: [birth year], 79 y.o.   MRN: [id number]  HPI    Review of Systems     Objective:   Physical Exam        Assessment & Plan:  Patient here for vitamin B12 injection only.

## 2013-03-20 ENCOUNTER — Other Ambulatory Visit: Payer: Self-pay | Admitting: *Deleted

## 2013-03-20 MED ORDER — ROSUVASTATIN CALCIUM 10 MG PO TABS: 10.0000 mg | ORAL_TABLET | Freq: Every day | ORAL | Status: DC

## 2013-03-20 NOTE — Telephone Encounter (Signed)
Rx was sent to pharmacy electronically. 

## 2013-03-21 ENCOUNTER — Other Ambulatory Visit: Payer: Self-pay | Admitting: *Deleted

## 2013-03-21 MED ORDER — ROSUVASTATIN CALCIUM 5 MG PO TABS: 5.0000 mg | ORAL_TABLET | Freq: Every day | ORAL | Status: DC

## 2013-04-19 ENCOUNTER — Ambulatory Visit (INDEPENDENT_AMBULATORY_CARE_PROVIDER_SITE_OTHER): Payer: Medicare Other | Admitting: Family Medicine

## 2013-04-19 DIAGNOSIS — E538 Deficiency of other specified B group vitamins: Secondary | ICD-10-CM | POA: Diagnosis not present

## 2013-04-19 MED ORDER — CYANOCOBALAMIN 1000 MCG/ML IJ SOLN
1000.0000 ug | INTRAMUSCULAR | Status: AC
  Administered 2013-04-19 – 2014-03-23 (×6): 1000 ug via INTRAMUSCULAR

## 2013-05-02 ENCOUNTER — Ambulatory Visit (INDEPENDENT_AMBULATORY_CARE_PROVIDER_SITE_OTHER): Payer: Medicare Other | Admitting: Emergency Medicine

## 2013-05-02 VITALS — BP 136/82 | HR 66 | Temp 98.2°F | Resp 16 | Ht 68.5 in | Wt 203.0 lb

## 2013-05-02 DIAGNOSIS — E785 Hyperlipidemia, unspecified: Secondary | ICD-10-CM

## 2013-05-02 DIAGNOSIS — E538 Deficiency of other specified B group vitamins: Secondary | ICD-10-CM | POA: Diagnosis not present

## 2013-05-02 DIAGNOSIS — D649 Anemia, unspecified: Secondary | ICD-10-CM

## 2013-05-02 DIAGNOSIS — I10 Essential (primary) hypertension: Secondary | ICD-10-CM | POA: Diagnosis not present

## 2013-05-02 DIAGNOSIS — E7800 Pure hypercholesterolemia, unspecified: Secondary | ICD-10-CM

## 2013-05-02 LAB — LIPID PANEL
Cholesterol: 124 mg/dL (ref 0–200)
HDL: 37 mg/dL — ABNORMAL LOW (ref >39)
Total CHOL/HDL Ratio: 3.4 Ratio
VLDL: 20 mg/dL (ref 0–40)

## 2013-05-02 LAB — CBC WITH DIFFERENTIAL/PLATELET
Basophils Absolute: 0 10*3/uL (ref 0.0–0.1)
Eosinophils Absolute: 0.4 10*3/uL (ref 0.0–0.7)
Eosinophils Relative: 4 % (ref 0–5)
Lymphs Abs: 1.6 10*3/uL (ref 0.7–4.0)
MCH: 31.8 pg (ref 26.0–34.0)
MCV: 93.3 fL (ref 78.0–100.0)
Monocytes Absolute: 1 10*3/uL (ref 0.1–1.0)
Platelets: 246 10*3/uL (ref 150–400)
RDW: 13.9 % (ref 11.5–15.5)

## 2013-05-02 LAB — COMPREHENSIVE METABOLIC PANEL
ALT: 14 U/L (ref 0–53)
BUN: 31 mg/dL — ABNORMAL HIGH (ref 6–23)
CO2: 25 mEq/L (ref 19–32)
Creat: 1.79 mg/dL — ABNORMAL HIGH (ref 0.50–1.35)
Total Bilirubin: 0.7 mg/dL (ref 0.3–1.2)

## 2013-05-02 NOTE — Progress Notes (Signed)
  Subjective:    Patient ID: Mark Jackson., male    DOB: February 28, 1930, 79 y.o.   MRN: [id number]  HPI patient here to followup hypertension. He is currently taking his medication regularly. He denies any chest pain shortness of breath or cardiac symptoms. #2 he has a history of B12 deficiency he gets B12 shots every month and is doing well at this. He denies neuropathy. Problem #3 renal disease his hemoglobin BUN and creatinine have been stable over the last year. He has had no progression of his renal disease. He gets his flu shot at work and is currently up-to-date on his other immunizations. He sees Dr. Maurene Capes as his GI specialist and is up-to-date on that screening he still works every day 7 hours a day. He gets 10 hours sleep at night and exercises regularly.    Review of Systems     Objective:   Physical Exam patient is alert and cooperative and not in distress. Chest was clear heart regular and no murmurs abdomen soft nontender extremity exam revealed 2+ pulses no edema sensation intact        Assessment & Plan:  Routine labs were done today to followup on renal insufficiency and his B12 deficiency. He also has hyperlipidemia. He will get a flu shot at work pressure is at goal no change in medications.

## 2013-05-18 ENCOUNTER — Ambulatory Visit (INDEPENDENT_AMBULATORY_CARE_PROVIDER_SITE_OTHER): Payer: Medicare Other | Admitting: Radiology

## 2013-05-18 DIAGNOSIS — E538 Deficiency of other specified B group vitamins: Secondary | ICD-10-CM | POA: Diagnosis not present

## 2013-06-15 ENCOUNTER — Other Ambulatory Visit: Payer: Self-pay | Admitting: Physician Assistant

## 2013-06-20 ENCOUNTER — Ambulatory Visit (INDEPENDENT_AMBULATORY_CARE_PROVIDER_SITE_OTHER): Payer: Medicare Other | Admitting: Family Medicine

## 2013-06-20 DIAGNOSIS — E559 Vitamin D deficiency, unspecified: Secondary | ICD-10-CM

## 2013-06-20 DIAGNOSIS — D518 Other vitamin B12 deficiency anemias: Secondary | ICD-10-CM

## 2013-06-20 NOTE — Progress Notes (Signed)
  Subjective:    Patient ID: Mark Jackson., male    DOB: 20-Jun-1930, 79 y.o.   MRN: [id number]  HPI  Vitamin b12 injection    Review of Systems     Objective:   Physical Exam        Assessment & Plan:

## 2013-07-25 ENCOUNTER — Encounter: Payer: Self-pay | Admitting: *Deleted

## 2013-07-25 ENCOUNTER — Other Ambulatory Visit: Payer: Self-pay | Admitting: Emergency Medicine

## 2013-07-25 ENCOUNTER — Ambulatory Visit (INDEPENDENT_AMBULATORY_CARE_PROVIDER_SITE_OTHER): Payer: Medicare Other | Admitting: *Deleted

## 2013-07-25 VITALS — BP 130/78 | Temp 97.8°F

## 2013-07-25 DIAGNOSIS — E538 Deficiency of other specified B group vitamins: Secondary | ICD-10-CM

## 2013-07-25 DIAGNOSIS — R05 Cough: Secondary | ICD-10-CM

## 2013-07-25 MED ORDER — HYDROCODONE-HOMATROPINE 5-1.5 MG/5ML PO SYRP: 5.0000 mL | ORAL_SOLUTION | Freq: Three times a day (TID) | ORAL | Status: DC | PRN

## 2013-07-25 NOTE — Progress Notes (Signed)
   Subjective:    Patient ID: Mark Jackson., male    DOB: 04/13/1930, 79 y.o.   MRN: [id number]  HPI patient here for B 12 injection only.    Review of Systems     Objective:   Physical Exam        Assessment & Plan:

## 2013-08-08 ENCOUNTER — Other Ambulatory Visit (HOSPITAL_COMMUNITY): Payer: Self-pay | Admitting: Cardiovascular Disease

## 2013-08-15 ENCOUNTER — Ambulatory Visit (INDEPENDENT_AMBULATORY_CARE_PROVIDER_SITE_OTHER): Payer: Medicare Other | Admitting: Emergency Medicine

## 2013-08-15 ENCOUNTER — Encounter: Payer: Self-pay | Admitting: Emergency Medicine

## 2013-08-15 VITALS — BP 160/88 | HR 80 | Temp 97.9°F | Resp 16 | Ht 67.5 in | Wt 198.0 lb

## 2013-08-15 DIAGNOSIS — I10 Essential (primary) hypertension: Secondary | ICD-10-CM

## 2013-08-15 DIAGNOSIS — E785 Hyperlipidemia, unspecified: Secondary | ICD-10-CM

## 2013-08-15 DIAGNOSIS — N186 End stage renal disease: Secondary | ICD-10-CM

## 2013-08-15 DIAGNOSIS — E538 Deficiency of other specified B group vitamins: Secondary | ICD-10-CM

## 2013-08-15 LAB — CBC WITH DIFFERENTIAL/PLATELET
Basophils Absolute: 0.1 10*3/uL (ref 0.0–0.1)
Basophils Relative: 1 % (ref 0–1)
Eosinophils Absolute: 0.5 10*3/uL (ref 0.0–0.7)
Eosinophils Relative: 4 % (ref 0–5)
HEMATOCRIT: 37.9 % — AB (ref 39.0–52.0)
HEMOGLOBIN: 12.8 g/dL — AB (ref 13.0–17.0)
LYMPHS ABS: 1.9 10*3/uL (ref 0.7–4.0)
Lymphocytes Relative: 19 % (ref 12–46)
MCH: 32.1 pg (ref 26.0–34.0)
MCHC: 33.8 g/dL (ref 30.0–36.0)
MCV: 95 fL (ref 78.0–100.0)
MONOS PCT: 9 % (ref 3–12)
Monocytes Absolute: 0.9 10*3/uL (ref 0.1–1.0)
NEUTROS ABS: 7.1 10*3/uL (ref 1.7–7.7)
NEUTROS PCT: 67 % (ref 43–77)
Platelets: 259 10*3/uL (ref 150–400)
RBC: 3.99 MIL/uL — AB (ref 4.22–5.81)
RDW: 13.8 % (ref 11.5–15.5)
WBC: 10.5 10*3/uL (ref 4.0–10.5)

## 2013-08-15 NOTE — Progress Notes (Signed)
   Subjective:    Patient ID: Mark Jackson., male    DOB: 27-Mar-1930, 80 y.o.   MRN: [id number]  HPI patient here for blood pressure check and general checkup. He states he feels well. He tries to walk on a regular basis. He still works 6/7 days a week. He would like to go ahead and get his B12 shot even though it is a little early . He is due to have his carotid studies in the near future.    Review of Systems     Objective:   Physical Exam repeat blood pressure was 140/86. His neck was supple without carotid bruits his chest is clear heart regular rate no murmurs. Abdomen is soft nontender.        Assessment & Plan:  Blood pressure is acceptable. He has renal disease and we need to keep a close watch on this. No change in medications at the present time. He was given his B12 shot today

## 2013-08-16 LAB — COMPLETE METABOLIC PANEL WITH GFR
ALBUMIN: 3.7 g/dL (ref 3.5–5.2)
ALT: 13 U/L (ref 0–53)
AST: 18 U/L (ref 0–37)
Alkaline Phosphatase: 56 U/L (ref 39–117)
BUN: 30 mg/dL — ABNORMAL HIGH (ref 6–23)
CALCIUM: 8.9 mg/dL (ref 8.4–10.5)
CHLORIDE: 108 meq/L (ref 96–112)
CO2: 25 meq/L (ref 19–32)
Creat: 1.58 mg/dL — ABNORMAL HIGH (ref 0.50–1.35)
GFR, EST AFRICAN AMERICAN: 47 mL/min — AB
GFR, EST NON AFRICAN AMERICAN: 40 mL/min — AB
GLUCOSE: 89 mg/dL (ref 70–99)
POTASSIUM: 4.1 meq/L (ref 3.5–5.3)
SODIUM: 141 meq/L (ref 135–145)
TOTAL PROTEIN: 6.5 g/dL (ref 6.0–8.3)
Total Bilirubin: 0.6 mg/dL (ref 0.3–1.2)

## 2013-08-16 LAB — LIPID PANEL
CHOL/HDL RATIO: 3.1 ratio
Cholesterol: 114 mg/dL (ref 0–200)
HDL: 37 mg/dL — ABNORMAL LOW (ref >39)
LDL Cholesterol: 64 mg/dL (ref 0–99)
Triglycerides: 63 mg/dL (ref <150)
VLDL: 13 mg/dL (ref 0–40)

## 2013-08-21 ENCOUNTER — Ambulatory Visit (HOSPITAL_COMMUNITY)
Admission: RE | Admit: 2013-08-21 | Discharge: 2013-08-21 | Disposition: A | Discharge status: Home / Self Care | Payer: Medicare Other | Source: Ambulatory Visit | Attending: Cardiology | Admitting: Cardiology

## 2013-08-21 DIAGNOSIS — I672 Cerebral atherosclerosis: Secondary | ICD-10-CM | POA: Insufficient documentation

## 2013-08-21 DIAGNOSIS — I6529 Occlusion and stenosis of unspecified carotid artery: Secondary | ICD-10-CM

## 2013-08-21 NOTE — Progress Notes (Signed)
Carotid Duplex Completed. [name], BS, RDMS, RVT  

## 2013-08-23 ENCOUNTER — Telehealth: Payer: Self-pay | Admitting: *Deleted

## 2013-08-23 ENCOUNTER — Encounter: Payer: Self-pay | Admitting: *Deleted

## 2013-08-23 DIAGNOSIS — I6529 Occlusion and stenosis of unspecified carotid artery: Secondary | ICD-10-CM

## 2013-08-23 NOTE — Telephone Encounter (Signed)
Order placed for repeat carotid dopplers in 6 months  

## 2013-08-23 NOTE — Telephone Encounter (Signed)
Message copied by Chauncy Lean on Wed Aug 23, 2013 12:56 PM ------      Message from: Lorretta Harp      Created: Wed Aug 23, 2013  9:49 AM       No change from prior study. Repeat in 6 months ------

## 2013-08-30 DIAGNOSIS — G43009 Migraine without aura, not intractable, without status migrainosus: Secondary | ICD-10-CM | POA: Diagnosis not present

## 2013-08-31 ENCOUNTER — Telehealth: Payer: Self-pay

## 2013-08-31 ENCOUNTER — Encounter: Payer: Self-pay | Admitting: Cardiology

## 2013-08-31 ENCOUNTER — Ambulatory Visit (INDEPENDENT_AMBULATORY_CARE_PROVIDER_SITE_OTHER): Payer: Medicare Other | Admitting: Cardiology

## 2013-08-31 VITALS — BP 184/106 | HR 64 | Ht 69.0 in | Wt 204.9 lb

## 2013-08-31 DIAGNOSIS — I779 Disorder of arteries and arterioles, unspecified: Secondary | ICD-10-CM

## 2013-08-31 DIAGNOSIS — Z87898 Personal history of other specified conditions: Secondary | ICD-10-CM

## 2013-08-31 DIAGNOSIS — Z9189 Other specified personal risk factors, not elsewhere classified: Secondary | ICD-10-CM

## 2013-08-31 DIAGNOSIS — I10 Essential (primary) hypertension: Secondary | ICD-10-CM | POA: Diagnosis not present

## 2013-08-31 DIAGNOSIS — E785 Hyperlipidemia, unspecified: Secondary | ICD-10-CM | POA: Diagnosis not present

## 2013-08-31 DIAGNOSIS — E669 Obesity, unspecified: Secondary | ICD-10-CM

## 2013-08-31 DIAGNOSIS — I739 Peripheral vascular disease, unspecified: Secondary | ICD-10-CM

## 2013-08-31 NOTE — Patient Instructions (Signed)
FOLLOW UP WITH DR [name] CONCERNING YOUR BLOOD PRESSURE   Your physician wants you to follow-up in 12 MONTHS  Dr Ellyn Hack.  You will receive a reminder letter in the mail two months in advance. If you don't receive a letter, please call our office to schedule the follow-up appointment.

## 2013-08-31 NOTE — Telephone Encounter (Signed)
Pt dropped off form to be filled out for Auto ins to determine fitness to drive. Placed in Dr Perfecto Kingdom box for completion.

## 2013-09-03 ENCOUNTER — Encounter: Payer: Self-pay | Admitting: Cardiology

## 2013-09-03 DIAGNOSIS — E669 Obesity, unspecified: Secondary | ICD-10-CM | POA: Insufficient documentation

## 2013-09-03 DIAGNOSIS — Z87898 Personal history of other specified conditions: Secondary | ICD-10-CM | POA: Insufficient documentation

## 2013-09-03 HISTORY — DX: Obesity, unspecified: E66.9

## 2013-09-03 NOTE — Assessment & Plan Note (Signed)
Pressures are still elevated today. He tells noted just the other day they were in the [id number] systolic range.  On my recheck the blood pressure is one 150/70 mmHg. I will defer further evaluation and treatment of hypertension to his primary physician is likely seen more frequently than me. I'm reluctant to be too aggressive with his regimen to avoid any further episodes of syncope. He tells me that he was quite stressed this morning, he took his grandson to school, and simply doing that caused the 5 minute trip to the office to be a power half drive which always gets him very stressed.

## 2013-09-03 NOTE — Assessment & Plan Note (Signed)
Out that we ever figured out what is said to be was from. It is related to the carotid stenoses however. It was probably more related to a GI illness leading to dehydration. I just reminded him if he does or any symptoms of nausea and vomiting the patient could off on taking his Lasix.

## 2013-09-03 NOTE — Assessment & Plan Note (Addendum)
Last lipid panel from January 2015 was quite impressive with a cholesterol 114, LDL 64, HDL 37 and triglycerides 63. Continue Crestor low dose.

## 2013-09-03 NOTE — Assessment & Plan Note (Signed)
Just recommended that he continue to get out and exercise as well as monitor his dietary intake.

## 2013-09-03 NOTE — Assessment & Plan Note (Signed)
Stable over one year. No change. Continue aspirin and semiannual Dopplers per Dr. Gwenlyn Found.

## 2013-09-03 NOTE — Progress Notes (Signed)
PATIENT: Mark Jackson. MRN: [id number]  DOB: 06-Oct-1929   DOV:09/03/2013 PCP: Jenny Reichmann, MD  Clinic Note: Chief Complaint  Patient presents with  . Annual Exam    no chest pain , sob -little with exercise, no edema   HPI: Yashar Hemme. is a 80 y.o.  male with a PMH below who presents today for followup of a history of syncopal episodes and multiple cardiac risk factors. I last saw him in February 2014 when he had been released to the hospital after several episodes of syncope mostly occurred in the bathroom and refill warm flushed and collapse. He was disassociated with nausea vomiting and GI upset. He had a pretty detailed cardiovascular evaluation including carotid Dopplers which showed some stenosis of the right carotid of 60-80 % and is being followed by Dr. Quay Burow in this office for potential invasive evaluation in the future.  An echocardiogram will which was essentially normal with normal function normal EF of 55%. No valvular lesions. He also had a Myoview done that was negative for ischemia.  Interval History: Since I last saw him he has seen Dr. Gwenlyn Found back in July. His carotid Dopplers did not show any evidence of progression of disease, therefore the plan was continue monitoring him noninvasively. He has not yet meet criteria for endarterectomy. He tells me he is doing quite well since his last visit. No further episodes of syncope or near-syncope. No significant symptoms of the chest tightness or pressure with rest or exertion. No PND, or. He noted mild exertional dyspnea if he really exercises hard, but acknowledge that he does not exercise very much. Denies any irregular or rapid heartbeats. No TIA or amaurosis fugax symptoms. No melena, hematochezia or hematuria. No claudication symptoms.  Past Medical History  Diagnosis Date  . Hypertension   . Prostate ca   . Left ventricular diastolic dysfunction, NYHA class 08 October 2012    Echocardiogram March 2014: EF  55-60%. Moderate concentric LVH. Grade 1 diastolic dysfunction. Very mild aortic stenosis/sclerosis.   . Anemia   . Carotid artery disease     Right carotid 60-80% stenosis; stable from 2013-2014  . Hyperlipidemia   . Chronic kidney disease (CKD), stage III (moderate) B   . Obesity (BMI 30-39.9) 09/03/2013    Prior Cardiac Evaluation and Past Surgical History: Past Surgical History  Procedure Laterality Date  . Partial knee arthroplasty Bilateral   . Tonsillectomy and adenoidectomy    . Nm myoview ltd  February 2012    Persantine Myoview: Inferior attenuation artifact but no ischemia or infarction. Normal EF.    Allergies  Allergen Reactions  . Plavix [Clopidogrel Bisulfate]     Significant bruising    Current Outpatient Prescriptions  Medication Sig Dispense Refill  . acetaminophen (TYLENOL) 325 MG tablet Take 650 mg by mouth every 6 (six) hours as needed.      Marland Kitchen aspirin 81 MG tablet Take 81 mg by mouth daily.      . baclofen (LIORESAL) 10 MG tablet Take 10 mg by mouth as needed.      . diphenhydrAMINE (SOMINEX) 25 MG tablet Take 25 mg by mouth at bedtime as needed. For sleep      . ferrous fumarate (HEMOCYTE - 106 MG FE) 325 (106 FE) MG TABS Take 1 tablet by mouth.      . furosemide (LASIX) 40 MG tablet Take 40 mg by mouth daily as needed.       Marland Kitchen HYDROcodone-homatropine (  HYCODAN) 5-1.5 MG/5ML syrup Take 5 mLs by mouth every 8 (eight) hours as needed for cough.  120 mL  0  . losartan (COZAAR) 50 MG tablet TAKE 1 TABLET EACH DAY.  90 tablet  1  . rosuvastatin (CRESTOR) 5 MG tablet Take 1 tablet (5 mg total) by mouth daily.  90 tablet  3  . topiramate (TOPAMAX) 25 MG tablet Take 25 mg by mouth 2 (two) times daily.       Current Facility-Administered Medications  Medication Dose Route Frequency Provider Last Rate Last Dose  . cyanocobalamin ((VITAMIN B-12)) injection 1,000 mcg  1,000 mcg Intramuscular Q30 days Wardell Honour, MD   1,000 mcg at 07/25/13 1220    History    Social History Narrative   He is about 80 year old overweight gentleman who is married with 4 children, and 11 grandchildren with 2 great-grandchildren. 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   He used to smoke a pipe for several years but quit about 30 some years ago. He does not drink alcohol.          ROS: A comprehensive Review of Systems - Negative except Mild edema that  is off and on. He takes Lasix for that is usually well-controlled. he has a new migraine doctor which seemed to be really helping a lot.  PHYSICAL EXAM BP 184/106  Pulse 64  Ht 5\' 9"  (1.753 m)  Wt 204 lb 14.4 oz (92.942 kg)  BMI 30.24 kg/m2 General appearance: alert, cooperative, appears stated age, no distress, mildly obese and Well-nourished, well-groomed Neck: no adenopathy, no JVD and Soft right carotid bruit Lungs: clear to auscultation bilaterally, normal percussion bilaterally and Nonlabored, good air movement Heart: normal apical impulse, regular rate and rhythm, S1, S2 normal and 1-2/6 crescendo decrescendo mid-early peaking SEM at RUSB.  Radiates to carotids. Otherwise no R./G. Abdomen: soft, non-tender; bowel sounds normal; no masses,  no organomegaly and Truncal obesity Extremities: edema 1-2+ bilaterally with mild venous stasis changes and mild varicosities. Pulses: 2+ and symmetric Neurologic: Grossly normal  [id number] today: Yes Rate: 64 , Rhythm: NSR; normal ECG;   Recent Labs: Reviewed in Epic. See below for details from 08/15/2013  Reacting was actually the best it has been for a while at 1.58 -- down from a baseline of 1.8.  ASSESSMENT / PLAN: Relatively stable, just somewhat labile blood pressure. I recommended the follows up with his primary physician on a more frequent to ensure that his blood pressures are stable. Otherwise stable from a cardiac standpoint. Okay for annual followup.  HYPERTENSION Pressures are still  elevated today. He tells noted just the other day they were in the [id number] systolic range.  On my recheck the blood pressure is one 150/70 mmHg. I will defer further evaluation and treatment of hypertension to his primary physician is likely seen more frequently than me. I'm reluctant to be too aggressive with his regimen to avoid any further episodes of syncope. He tells me that he was quite stressed this morning, he took his grandson to school, and simply doing that caused the 5 minute trip to the office to be a power half drive which always gets him very stressed.  H/O syncope Out that we ever figured out what is said to be was from. It is related to the carotid stenoses however. It was probably more related to a GI illness leading to dehydration. I just reminded him if he  does or any symptoms of nausea and vomiting the patient could off on taking his Lasix.  Obesity (BMI 30-39.9) Just recommended that he continue to get out and exercise as well as monitor his dietary intake.  Hyperlipidemia Last lipid panel from January 2015 was quite impressive with a cholesterol 114, LDL 64, HDL 37 and triglycerides 63. Continue Crestor low dose.  Carotid artery disease Stable over one year. No change. Continue aspirin and semiannual Dopplers per Dr. Gwenlyn Found.    Orders Placed This Encounter  Procedures  . EKG 12-Lead   Meds ordered this encounter  Medications  . acetaminophen (TYLENOL) 325 MG tablet    Sig: Take 650 mg by mouth every 6 (six) hours as needed.    Followup: One year  Etheridge Geil W. Ellyn Hack, M.D., M.S. [facility] [address] [place], [place]  [id number]  [phone] Pager # [phone]

## 2013-09-05 ENCOUNTER — Ambulatory Visit: Payer: Medicare Other | Admitting: Emergency Medicine

## 2013-09-18 ENCOUNTER — Ambulatory Visit (INDEPENDENT_AMBULATORY_CARE_PROVIDER_SITE_OTHER): Payer: Medicare Other | Admitting: *Deleted

## 2013-09-18 DIAGNOSIS — E538 Deficiency of other specified B group vitamins: Secondary | ICD-10-CM

## 2013-09-18 NOTE — Progress Notes (Signed)
   Subjective:    Patient ID: Mark Jackson., male    DOB: Dec 21, 1929, 80 y.o.   MRN: [id number]  HPI    Review of Systems     Objective:   Physical Exam        Assessment & Plan:  Patient here for B 12 injection only.

## 2013-10-09 ENCOUNTER — Ambulatory Visit (INDEPENDENT_AMBULATORY_CARE_PROVIDER_SITE_OTHER): Payer: Medicare Other | Admitting: Family Medicine

## 2013-10-09 VITALS — BP 146/82 | HR 72 | Temp 97.4°F | Resp 16 | Ht 68.0 in | Wt 196.0 lb

## 2013-10-09 DIAGNOSIS — J329 Chronic sinusitis, unspecified: Secondary | ICD-10-CM

## 2013-10-09 DIAGNOSIS — J322 Chronic ethmoidal sinusitis: Secondary | ICD-10-CM | POA: Diagnosis not present

## 2013-10-09 MED ORDER — AMOXICILLIN 875 MG PO TABS: 875.0000 mg | ORAL_TABLET | Freq: Two times a day (BID) | ORAL | Status: DC

## 2013-10-09 NOTE — Progress Notes (Signed)
Patient ID: Mark Jackson. MRN: [id number], DOB: [birth year], 80 y.o. Date of Encounter: 10/09/2013, 12:49 PM  Primary Physician: Jenny Reichmann, MD  Chief Complaint:  Chief Complaint  Patient presents with  . Sore Throat    x 2 months    HPI: 80 y.o. year old male presents with 78 day history of nasal congestion, post nasal drip, sore throat, sinus pressure, and cough. Afebrile. No chills. Nasal congestion thick and green/yellow. Sinus pressure is the worst symptom. Cough is productive secondary to post nasal drip and not associated with time of day. Ears feel full, leading to sensation of muffled hearing. Has tried OTC cold preps without success. No GI complaints.   No recent antibiotics, recent travels, or sick contacts   No leg trauma, sedentary periods, h/o cancer, or tobacco use.  Past Medical History  Diagnosis Date  . Hypertension   . Prostate ca   . Left ventricular diastolic dysfunction, NYHA class 08 October 2012    Echocardiogram March 2014: EF 55-60%. Moderate concentric LVH. Grade 1 diastolic dysfunction. Very mild aortic stenosis/sclerosis.   . Anemia   . Carotid artery disease     Right carotid 60-80% stenosis; stable from 2013-2014  . Hyperlipidemia   . Chronic kidney disease (CKD), stage III (moderate) B   . Obesity (BMI 30-39.9) 09/03/2013     Home Meds: Prior to Admission medications   Medication Sig Start Date End Date Taking? Authorizing Provider  acetaminophen (TYLENOL) 325 MG tablet Take 650 mg by mouth every 6 (six) hours as needed.   Yes Historical Provider, MD  aspirin 81 MG tablet Take 81 mg by mouth daily.   Yes Historical Provider, MD  baclofen (LIORESAL) 10 MG tablet Take 10 mg by mouth as needed.   Yes Historical Provider, MD  diphenhydrAMINE (SOMINEX) 25 MG tablet Take 25 mg by mouth at bedtime as needed. For sleep   Yes Historical Provider, MD  ferrous fumarate (HEMOCYTE - 106 MG FE) 325 (106 FE) MG TABS Take 1 tablet by mouth.   Yes  Historical Provider, MD  furosemide (LASIX) 40 MG tablet Take 40 mg by mouth daily as needed.    Yes Historical Provider, MD  HYDROcodone-homatropine (HYCODAN) 5-1.5 MG/5ML syrup Take 5 mLs by mouth every 8 (eight) hours as needed for cough. 07/25/13  Yes [name], MD  losartan (COZAAR) 50 MG tablet TAKE 1 TABLET EACH DAY. 06/15/13  Yes [name], MD  rosuvastatin (CRESTOR) 5 MG tablet Take 1 tablet (5 mg total) by mouth daily. 03/21/13  Yes Lorretta Harp, MD  topiramate (TOPAMAX) 25 MG tablet Take 25 mg by mouth 2 (two) times daily.   Yes Historical Provider, MD  amoxicillin (AMOXIL) 875 MG tablet Take 1 tablet (875 mg total) by mouth 2 (two) times daily. 10/09/13   Robyn Haber, MD    Allergies:  Allergies  Allergen Reactions  . Plavix [Clopidogrel Bisulfate]     Significant bruising    History   Social History  . Marital Status: Married    Spouse Name: N/A    Number of Children: N/A  . Years of Education: N/A   Occupational History  . Not on file.   Social History Main Topics  . Smoking status: Former Smoker    Types: Pipe, Landscape architect  . Smokeless tobacco: Not on file  . Alcohol Use: No  . Drug Use: No  . Sexual Activity: Not on file   Other Topics Concern  .  Not on file   Social History Narrative   He is about 80 year old overweight gentleman who is married with 4 children, and 11 grandchildren with 2 great-grandchildren. 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   He used to smoke a pipe for several years but quit about 30 some years ago. He does not drink alcohol.           Review of Systems: Constitutional: negative for chills, fever, night sweats or weight changes Cardiovascular: negative for chest pain or palpitations Respiratory: negative for hemoptysis, wheezing, or shortness of breath Abdominal: negative for abdominal pain, nausea, vomiting or diarrhea Dermatological: negative for  rash Neurologic: negative for headache   Physical Exam: Blood pressure 146/82, pulse 72, temperature 97.4 F (36.3 C), temperature source Oral, resp. rate 16, height 5\' 8"  (1.727 m), weight 196 lb (88.905 kg), SpO2 95.00%., Body mass index is 29.81 kg/(m^2). General: Well developed, well nourished, in no acute distress. Head: Normocephalic, atraumatic, eyes without discharge, sclera non-icteric, nares are congested. Bilateral auditory canals clear, TM's are without perforation, pearly grey with reflective cone of light bilaterally. Serous effusion bilaterally behind TM's. Maxillary sinus TTP. Oral cavity moist, dentition normal. Posterior pharynx with post nasal drip and mild erythema. No peritonsillar abscess or tonsillar exudate. Neck: Supple. No thyromegaly. Full ROM. No lymphadenopathy. Lungs: Clear bilaterally to auscultation without wheezes, rales, or rhonchi. Breathing is unlabored.  Heart: RRR with S1 S2. No murmurs, rubs, or gallops appreciated. Msk:  Strength and tone normal for age. Extremities: No clubbing or cyanosis. No edema. Neuro: Alert and oriented X 3. Moves all extremities spontaneously. CNII-XII grossly in tact. Psych:  Responds to questions appropriately with a normal affect.   Labs:   ASSESSMENT AND PLAN:  80 y.o. year old male with sinusitis - Sinusitis - Plan: amoxicillin (AMOXIL) 875 MG tablet   -Tylenol/Motrin prn -Rest/fluids -RTC precautions -RTC 3-5 days if no improvement  Signed, Robyn Haber, MD 10/09/2013 12:49 PM

## 2013-10-09 NOTE — Patient Instructions (Signed)

## 2013-10-10 DIAGNOSIS — Z85828 Personal history of other malignant neoplasm of skin: Secondary | ICD-10-CM | POA: Diagnosis not present

## 2013-10-10 DIAGNOSIS — D692 Other nonthrombocytopenic purpura: Secondary | ICD-10-CM | POA: Diagnosis not present

## 2013-10-10 DIAGNOSIS — L570 Actinic keratosis: Secondary | ICD-10-CM | POA: Diagnosis not present

## 2013-10-10 DIAGNOSIS — Q828 Other specified congenital malformations of skin: Secondary | ICD-10-CM | POA: Diagnosis not present

## 2013-10-10 DIAGNOSIS — L821 Other seborrheic keratosis: Secondary | ICD-10-CM | POA: Diagnosis not present

## 2013-10-20 ENCOUNTER — Ambulatory Visit (INDEPENDENT_AMBULATORY_CARE_PROVIDER_SITE_OTHER): Payer: Medicare Other | Admitting: *Deleted

## 2013-10-20 ENCOUNTER — Encounter: Payer: Self-pay | Admitting: *Deleted

## 2013-10-20 VITALS — BP 140/82 | Temp 98.2°F

## 2013-10-20 DIAGNOSIS — E538 Deficiency of other specified B group vitamins: Secondary | ICD-10-CM | POA: Diagnosis not present

## 2013-10-20 MED ORDER — CYANOCOBALAMIN 1000 MCG/ML IJ SOLN
1000.0000 ug | Freq: Once | INTRAMUSCULAR | Status: AC
  Administered 2013-10-20: 1000 ug via INTRAMUSCULAR

## 2013-10-20 NOTE — Progress Notes (Signed)
   Subjective:    Patient ID: Mark Jackson., male    DOB: 1930-05-07, 80 y.o.   MRN: [id number]  HPI patient in for B 12 injection only.     Review of Systems     Objective:   Physical Exam        Assessment & Plan:

## 2013-10-31 DIAGNOSIS — H35379 Puckering of macula, unspecified eye: Secondary | ICD-10-CM | POA: Diagnosis not present

## 2013-11-21 ENCOUNTER — Encounter: Payer: Self-pay | Admitting: *Deleted

## 2013-11-21 ENCOUNTER — Ambulatory Visit (INDEPENDENT_AMBULATORY_CARE_PROVIDER_SITE_OTHER): Payer: Medicare Other | Admitting: *Deleted

## 2013-11-21 VITALS — BP 126/80 | Temp 97.9°F

## 2013-11-21 DIAGNOSIS — E538 Deficiency of other specified B group vitamins: Secondary | ICD-10-CM | POA: Diagnosis not present

## 2013-11-21 MED ORDER — CYANOCOBALAMIN 1000 MCG/ML IJ SOLN
1000.0000 ug | Freq: Once | INTRAMUSCULAR | Status: AC
  Administered 2013-11-21: 1000 ug via INTRAMUSCULAR

## 2013-11-21 NOTE — Progress Notes (Signed)
   Subjective:    Patient ID: Mark Jackson., male    DOB: 10-Sep-1929, 80 y.o.   MRN: [id number]  HPI patient in for B 12 injection only.    Review of Systems     Objective:   Physical Exam        Assessment & Plan:

## 2013-12-21 ENCOUNTER — Other Ambulatory Visit: Payer: Self-pay | Admitting: Emergency Medicine

## 2013-12-21 ENCOUNTER — Ambulatory Visit (INDEPENDENT_AMBULATORY_CARE_PROVIDER_SITE_OTHER): Payer: Medicare Other | Admitting: Family Medicine

## 2013-12-21 DIAGNOSIS — E538 Deficiency of other specified B group vitamins: Secondary | ICD-10-CM

## 2013-12-26 ENCOUNTER — Ambulatory Visit: Payer: Medicare Other | Admitting: Emergency Medicine

## 2014-01-02 DIAGNOSIS — M545 Low back pain, unspecified: Secondary | ICD-10-CM | POA: Diagnosis not present

## 2014-01-02 DIAGNOSIS — M5137 Other intervertebral disc degeneration, lumbosacral region: Secondary | ICD-10-CM | POA: Diagnosis not present

## 2014-01-04 ENCOUNTER — Encounter: Payer: Self-pay | Admitting: Emergency Medicine

## 2014-01-04 ENCOUNTER — Ambulatory Visit (INDEPENDENT_AMBULATORY_CARE_PROVIDER_SITE_OTHER): Payer: Medicare Other | Admitting: Emergency Medicine

## 2014-01-04 VITALS — BP 141/79 | HR 60 | Temp 98.7°F | Resp 16 | Ht 69.0 in | Wt 198.0 lb

## 2014-01-04 DIAGNOSIS — N186 End stage renal disease: Secondary | ICD-10-CM | POA: Diagnosis not present

## 2014-01-04 DIAGNOSIS — E538 Deficiency of other specified B group vitamins: Secondary | ICD-10-CM | POA: Diagnosis not present

## 2014-01-04 DIAGNOSIS — M109 Gout, unspecified: Secondary | ICD-10-CM

## 2014-01-04 DIAGNOSIS — I6529 Occlusion and stenosis of unspecified carotid artery: Secondary | ICD-10-CM

## 2014-01-04 DIAGNOSIS — R269 Unspecified abnormalities of gait and mobility: Secondary | ICD-10-CM

## 2014-01-04 DIAGNOSIS — M2142 Flat foot [pes planus] (acquired), left foot: Secondary | ICD-10-CM

## 2014-01-04 DIAGNOSIS — D649 Anemia, unspecified: Secondary | ICD-10-CM

## 2014-01-04 DIAGNOSIS — N289 Disorder of kidney and ureter, unspecified: Secondary | ICD-10-CM

## 2014-01-04 DIAGNOSIS — M2141 Flat foot [pes planus] (acquired), right foot: Secondary | ICD-10-CM

## 2014-01-04 LAB — CBC WITH DIFFERENTIAL/PLATELET
BASOS ABS: 0.1 10*3/uL (ref 0.0–0.1)
BASOS PCT: 1 % (ref 0–1)
EOS PCT: 5 % (ref 0–5)
Eosinophils Absolute: 0.5 10*3/uL (ref 0.0–0.7)
HEMATOCRIT: 37.5 % — AB (ref 39.0–52.0)
Hemoglobin: 12.8 g/dL — ABNORMAL LOW (ref 13.0–17.0)
LYMPHS PCT: 20 % (ref 12–46)
Lymphs Abs: 1.8 10*3/uL (ref 0.7–4.0)
MCH: 31.8 pg (ref 26.0–34.0)
MCHC: 34.1 g/dL (ref 30.0–36.0)
MCV: 93.1 fL (ref 78.0–100.0)
Monocytes Absolute: 0.7 10*3/uL (ref 0.1–1.0)
Monocytes Relative: 8 % (ref 3–12)
Neutro Abs: 6.1 10*3/uL (ref 1.7–7.7)
Neutrophils Relative %: 66 % (ref 43–77)
Platelets: 253 10*3/uL (ref 150–400)
RBC: 4.03 MIL/uL — ABNORMAL LOW (ref 4.22–5.81)
RDW: 13.7 % (ref 11.5–15.5)
WBC: 9.2 10*3/uL (ref 4.0–10.5)

## 2014-01-04 LAB — COMPLETE METABOLIC PANEL WITH GFR
ALT: 11 U/L (ref 0–53)
AST: 16 U/L (ref 0–37)
Albumin: 3.5 g/dL (ref 3.5–5.2)
Alkaline Phosphatase: 53 U/L (ref 39–117)
BUN: 34 mg/dL — AB (ref 6–23)
CHLORIDE: 109 meq/L (ref 96–112)
CO2: 23 mEq/L (ref 19–32)
CREATININE: 1.88 mg/dL — AB (ref 0.50–1.35)
Calcium: 8.8 mg/dL (ref 8.4–10.5)
GFR, Est African American: 38 mL/min — ABNORMAL LOW
GFR, Est Non African American: 33 mL/min — ABNORMAL LOW
GLUCOSE: 95 mg/dL (ref 70–99)
Potassium: 4.4 mEq/L (ref 3.5–5.3)
Sodium: 140 mEq/L (ref 135–145)
Total Bilirubin: 0.5 mg/dL (ref 0.2–1.2)
Total Protein: 6.5 g/dL (ref 6.0–8.3)

## 2014-01-04 LAB — VITAMIN B12: VITAMIN B 12: 573 pg/mL (ref 211–911)

## 2014-01-04 NOTE — Progress Notes (Signed)
   Subjective:    Patient ID: Mark Jackson., male    DOB: 08-Sep-1929, 81 y.o.   MRN: [id number] This chart was scribed for [name], MD by Terressa Koyanagi, ED Scribe. This patient was seen in room 22 and the patient's care was started at 10:00 AM.      HPI HPI Comments: Mark Jackson. is a 80 y.o. male who presents to the [facility] with several complaints.  (1) Reduced Bowel Movements- Pt reports he is having 2 bowel movements daily which is different than the past.  (2) Rx for orthotics- Pt reports he needs new orthotics for his shoes. His current pairs he has had since 1996.  (3) Increased Fatigue- Pt complains of generalized fatigue.  (4) Lower Extremity pain and weakness-  Pt reports the last time he saw a neurologist since Dr. Erling Cruz retired.  (5) Worsening Balance: Pt complains of worsening balance and intermittently find himself "against the wall."  Exercise: Pt reports taking 5 walks a week and is able to walk without his cane.    Review of Systems  Constitutional: Negative for fatigue and unexpected weight change.  Eyes: Negative for visual disturbance.  Respiratory: Negative for cough, chest tightness and shortness of breath.   Cardiovascular: Negative for chest pain, palpitations and leg swelling.  Gastrointestinal: Negative for abdominal pain and blood in stool.       Reduced bowel movements   Neurological: Positive for weakness (lower extremities bilaterally). Negative for dizziness, light-headedness and headaches.       Worsening balance       Objective:   Physical Exam CONSTITUTIONAL: Well developed/well nourished HEAD: Normocephalic/atraumatic EYES: EOMI/PERRL ENMT: Mucous membranes moist NECK: supple no meningeal signs SPINE:entire spine nontender CV: S1/S2 noted, no murmurs/rubs/gallops noted, 2/6 systolic murmur at base of heart radiates to neck.  LUNGS: Lungs are clear to auscultation bilaterally, no apparent distress ABDOMEN:  soft, nontender, no rebound or guarding GU:no cva tenderness NEURO: Pt is awake/alert, moves all extremitiesx4, walks with wide base gait - seemed somewhat unsteady walking down the hall.  EXTREMITIES: pulses normal, full ROM, extreme flat footed bilaterally  SKIN: warm, color normal PSYCH: no abnormalities of mood noted   Assessment & Plan:  Patient having increasing problems with his gait. I had him walk in the hall and he did appear to be somewhat unstable. He has had 2 falls in the last 6 months. Referral made to go for neurology and CT of the head ordered. I personally performed the services described in this documentation, which was scribed in my presence. The recorded information has been reviewed and is accurate.

## 2014-01-12 ENCOUNTER — Encounter: Payer: Self-pay | Admitting: Neurology

## 2014-01-12 ENCOUNTER — Ambulatory Visit (INDEPENDENT_AMBULATORY_CARE_PROVIDER_SITE_OTHER): Payer: Medicare Other | Admitting: Neurology

## 2014-01-12 VITALS — BP 174/84 | HR 63 | Ht 69.75 in | Wt 201.0 lb

## 2014-01-12 DIAGNOSIS — I6529 Occlusion and stenosis of unspecified carotid artery: Secondary | ICD-10-CM | POA: Diagnosis not present

## 2014-01-12 DIAGNOSIS — G609 Hereditary and idiopathic neuropathy, unspecified: Secondary | ICD-10-CM | POA: Diagnosis not present

## 2014-01-12 DIAGNOSIS — R269 Unspecified abnormalities of gait and mobility: Secondary | ICD-10-CM | POA: Diagnosis not present

## 2014-01-12 DIAGNOSIS — R7309 Other abnormal glucose: Secondary | ICD-10-CM | POA: Diagnosis not present

## 2014-01-12 DIAGNOSIS — R2681 Unsteadiness on feet: Secondary | ICD-10-CM

## 2014-01-12 NOTE — Patient Instructions (Signed)
Overall you are doing fairly well but I do want to suggest a few things today:   As far as diagnostic testing:  1)Please have some blood work checked today 2)Please schedule an EMG/Nerve conduction study  We will follow up with you once the above testing is completed. Please call us with any interim questions, concerns, problems, updates or refill requests.   My clinical assistant and will answer any of your questions and relay your messages to me and also relay most of my messages to you.   Our phone number is [phone]. We also have an after hours call service for urgent matters and there is a physician on-call for urgent questions. For any emergencies you know to call 911 or go to the nearest emergency room

## 2014-01-12 NOTE — Progress Notes (Signed)
[id number]    Provider:  Dr Janann Colonel Referring Provider: [name], MD Primary Care Physician:  Jenny Reichmann, MD  CC:  Gait instability  HPI:  Mark Jackson. is a 80 y.o. male here as a referral from Dr. Everlene Farrier for gait instability. Notes symptoms started 3 to 4 years ago. Initially noted poor balance, describes always needing to be near something to grab onto. Has been getting progressively worse, especially since the start of the year. Notes having 2 falls in the past year. States one fall occurred while walking to the bathroom in the night, other fall involved his knee giving out. Notes some fatigue in his legs after prolonged walking, can typically walk around 20 minutes. No burning or tingling in his feet. He does see Dr. Nelva Bush for history of back pain for which he gets occasional steroid injections. Notes having difficulty walking in the dark. No history of stroke or TIA.   Has been diagnosed with anemia in the past. B12 level recently checked and was normal. Has head CT pending.   Review of Systems: Out of a complete 14 system review, the patient complains of only the following symptoms, and all other reviewed systems are negative. + anemia, easy bruising, headache, snoring, diarrhea, ringing in ears, urinary problems  History   Social History  . Marital Status: Married    Spouse Name: N/A    Number of Children: N/A  . Years of Education: N/A   Occupational History  . Not on file.   Social History Main Topics  . Smoking status: Former Smoker    Types: Pipe, Landscape architect  . Smokeless tobacco: Never Used  . Alcohol Use: No  . Drug Use: No  . Sexual Activity: Not on file   Other Topics Concern  . Not on file   Social History Narrative   He is about 80 year old overweight gentleman who is married with 4 children, and 11 grandchildren with 2 great-grandchildren. 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   He used to smoke a pipe for several years but quit about 30 some years ago. He does not drink alcohol.          Family History  Problem Relation Age of Onset  . Cancer Mother     ovarin cancer    Past Medical History  Diagnosis Date  . Hypertension   . Prostate ca   . Left ventricular diastolic dysfunction, NYHA class 08 October 2012    Echocardiogram March 2014: EF 55-60%. Moderate concentric LVH. Grade 1 diastolic dysfunction. Very mild aortic stenosis/sclerosis.   . Anemia   . Carotid artery disease     Right carotid 60-80% stenosis; stable from 2013-2014  . Hyperlipidemia   . Chronic kidney disease (CKD), stage III (moderate) B   . Obesity (BMI 30-39.9) 09/03/2013    Past Surgical History  Procedure Laterality Date  . Partial knee arthroplasty Bilateral   . Tonsillectomy and adenoidectomy    . Nm myoview ltd  February 2012    Persantine Myoview: Inferior attenuation artifact but no ischemia or infarction. Normal EF.    Current Outpatient Prescriptions  Medication Sig Dispense Refill  . acetaminophen (TYLENOL) 325 MG tablet Take 650 mg by mouth every 6 (six) hours as needed.      Marland Kitchen amoxicillin (AMOXIL) 875 MG tablet Take 1 tablet (875 mg total) by mouth 2 (two) times daily.  20 tablet  0  . aspirin 81 MG tablet Take 81 mg by mouth daily.      . baclofen (LIORESAL) 10 MG tablet Take 10 mg by mouth as needed.      . diphenhydrAMINE (SOMINEX) 25 MG tablet Take 25 mg by mouth at bedtime as needed. For sleep      . ferrous fumarate (HEMOCYTE - 106 MG FE) 325 (106 FE) MG TABS Take 1 tablet by mouth.      Marland Kitchen HYDROcodone-homatropine (HYCODAN) 5-1.5 MG/5ML syrup Take 5 mLs by mouth every 8 (eight) hours as needed for cough.  120 mL  0  . Ibuprofen (ADVIL PO) Take by mouth.      . Loperamide HCl (ANTI-DIARRHEAL PO) Take by mouth.      . losartan (COZAAR) 50 MG tablet TAKE 1 TABLET EACH DAY.  90 tablet  0  . Multiple Vitamins-Minerals (MULTIVITAMIN WITH MINERALS) tablet  Take 1 tablet by mouth daily.      . rosuvastatin (CRESTOR) 5 MG tablet Take 1 tablet (5 mg total) by mouth daily.  90 tablet  3  . topiramate (TOPAMAX) 25 MG tablet Take 25 mg by mouth 2 (two) times daily.       Current Facility-Administered Medications  Medication Dose Route Frequency Provider Last Rate Last Dose  . cyanocobalamin ((VITAMIN B-12)) injection 1,000 mcg  1,000 mcg Intramuscular Q30 days Wardell Honour, MD   1,000 mcg at 12/21/13 1301    Allergies as of 01/12/2014 - Review Complete 01/12/2014  Allergen Reaction Noted  . Plavix [clopidogrel bisulfate]  09/03/2013    Vitals: BP 174/84  Pulse 63  Ht 5' 9.75" (1.772 m)  Wt 201 lb (91.173 kg)  BMI 29.04 kg/m2 Last Weight:  Wt Readings from Last 1 Encounters:  01/12/14 201 lb (91.173 kg)   Last Height:   Ht Readings from Last 1 Encounters:  01/12/14 5' 9.75" (1.772 m)     Physical exam: Exam: Gen: NAD, conversant Eyes: anicteric sclerae, moist conjunctivae HENT: Atraumatic, oropharynx clear Neck: Trachea midline; supple,  Lungs: CTA, no wheezing, rales, rhonic                          CV: RRR, no MRG Abdomen: Soft, non-tender;  Extremities: No peripheral edema  Skin: Normal temperature, no rash,  Psych: Appropriate affect, pleasant  Neuro: MS: AA&Ox3, appropriately interactive, normal affect   Attention: WORLD backwards  Speech: fluent w/o paraphasic error  Memory: good recent and remote recall  CN: PERRL, EOMI no nystagmus, no ptosis, sensation intact to LT V1-V3 bilat, face symmetric, no weakness, hearing grossly intact, palate elevates symmetrically, shoulder shrug 5/5 bilat,  tongue protrudes midline, no fasiculations noted.  Motor: normal bulk and tone Strength: 5/5  In all extremities  Coord: rapid alternating and point-to-point (FNF, HTS) movements intact.  Reflexes: diminished but symmetrical, bilat downgoing toes  Sens: decreased LT bilateral LE, decreased PP, absent vibration to  mid-shin, absent proprioception  Gait: posture, stance, stride and arm-swing normal. Unable to Tandem walk. Wobbles and nearly falls with feet together and eyes closed   Assessment:  After physical and neurologic examination, review of laboratory studies, imaging, neurophysiology testing and pre-existing records, assessment will be reviewed on the problem list.  Plan:  Treatment plan and additional workup will be reviewed under Problem List.  1)peripheral neuropathy 2)gait instability  80y/o gentleman presenting for initial evaluation of progressively worsening gait instability. Physical exam pertinent for signs of a peripheral neuropathy,  specifically loss of vibration and proprioception. Suspect this is contributing to his gait instability. Will check lab work and EMG/NCS. Discussed PT but patient wishes to hold off at this time, counseled him to use a cane when walking. Will follow up once workup completed.   Jim Like, DO  [facility] [address] [place], [place] [address]  Phone [phone] Fax [phone]

## 2014-01-15 ENCOUNTER — Ambulatory Visit
Admission: RE | Admit: 2014-01-15 | Discharge: 2014-01-15 | Disposition: A | Discharge status: Home / Self Care | Payer: Medicare Other | Source: Ambulatory Visit | Attending: Emergency Medicine | Admitting: Emergency Medicine

## 2014-01-15 DIAGNOSIS — C61 Malignant neoplasm of prostate: Secondary | ICD-10-CM | POA: Diagnosis not present

## 2014-01-15 DIAGNOSIS — R269 Unspecified abnormalities of gait and mobility: Secondary | ICD-10-CM

## 2014-01-16 ENCOUNTER — Telehealth: Payer: Self-pay

## 2014-01-16 LAB — PROTEIN ELECTROPHORESIS
A/G Ratio: 1.2 (ref 0.7–2.0)
ALPHA 2: 0.8 g/dL (ref 0.4–1.2)
Albumin ELP: 3.7 g/dL (ref 3.2–5.6)
Alpha 1: 0.2 g/dL (ref 0.1–0.4)
Beta: 0.8 g/dL (ref 0.6–1.3)
GAMMA GLOBULIN: 1.3 g/dL (ref 0.5–1.6)
GLOBULIN, TOTAL: 3.1 g/dL (ref 2.0–4.5)
Total Protein: 6.8 g/dL (ref 6.0–8.5)

## 2014-01-16 LAB — HGB A1C W/O EAG: HEMOGLOBIN A1C: 6 % — AB (ref 4.8–5.6)

## 2014-01-16 NOTE — Telephone Encounter (Deleted)
See CT results from 01/04/14

## 2014-01-17 ENCOUNTER — Ambulatory Visit (INDEPENDENT_AMBULATORY_CARE_PROVIDER_SITE_OTHER): Payer: Medicare Other | Admitting: Neurology

## 2014-01-17 ENCOUNTER — Encounter (INDEPENDENT_AMBULATORY_CARE_PROVIDER_SITE_OTHER): Payer: Self-pay | Admitting: Radiology

## 2014-01-17 DIAGNOSIS — G609 Hereditary and idiopathic neuropathy, unspecified: Secondary | ICD-10-CM

## 2014-01-17 DIAGNOSIS — R2681 Unsteadiness on feet: Secondary | ICD-10-CM

## 2014-01-17 DIAGNOSIS — G63 Polyneuropathy in diseases classified elsewhere: Secondary | ICD-10-CM | POA: Diagnosis not present

## 2014-01-17 DIAGNOSIS — Z0289 Encounter for other administrative examinations: Secondary | ICD-10-CM

## 2014-01-17 NOTE — Procedures (Signed)
     HISTORY:  Mark Jackson is an 80 year old patient with a history of some problems with balance over the last 2 years, particularly notable over the last 6 months. The patient does have some chronic low back pain without radiation down the legs. He is being evaluated for a possible peripheral neuropathy.  NERVE CONDUCTION STUDIES:  Nerve conduction studies were performed on the left upper extremity. The distal motor latency for the left median nerve was prolonged, with a normal motor amplitude. The distal motor latency and motor amplitudes for the left ulnar nerve were normal. The F wave latencies and nerve conduction velocities for the left median and ulnar nerves were normal. The sensory latency for the left median nerve was borderline normal, slightly prolonged for the left ulnar nerve.  Nerve conduction studies were performed on both lower extremities. The distal motor latencies for the peroneal nerves were normal bilaterally, with a low motor amplitude on the right, normal on the left. Slowing was seen for the peroneal nerves bilaterally above and below the knee. The study of the right posterior tibial nerve was unobtainable. The distal motor latency for the left posterior tibial nerve was normal, with a low motor amplitude. Slowing was seen for the left posterior tibial nerve. The F wave latencies for the peroneal nerves bilaterally and for the left posterior tibial nerves were prolonged. The F wave latency for the right posterior tibial nerve was unobtainable. The sensory latencies for the left sural nerve and for the peroneal nerves bilaterally were unobtainable.  EMG STUDIES:  EMG study was performed on the right lower extremity:  The tibialis anterior muscle reveals up to 8K motor units with moderately reduced recruitment. No fibrillations or positive waves were seen. The peroneus tertius muscle reveals up to 6 K motor units with moderately reduced recruitment. No fibrillations or  positive waves were seen. The medial gastrocnemius muscle reveals 2 to 7K motor units with decreased recruitment. No fibrillations or positive waves were seen. The vastus lateralis muscle reveals 2 to 4K motor units with full recruitment. No fibrillations or positive waves were seen. The iliopsoas muscle reveals 2 to 4K motor units with full recruitment. No fibrillations or positive waves were seen. The biceps femoris muscle (long head) reveals 2 to 4K motor units with full recruitment. No fibrillations or positive waves were seen. The lumbosacral paraspinal muscles were tested at 3 levels, and revealed no abnormalities of insertional activity at the upper 2 levels tested. One isolated run of possible waves were seen in the lower level. There was good relaxation.   IMPRESSION:  Nerve conduction studies done on the left upper extremity and both lower extremities revealed evidence of a primarily axonal peripheral neuropathy of moderate severity. EMG evaluation of the right lower extremity shows distal chronic denervation consistent with the diagnosis of a peripheral neuropathy. There is no clear evidence of an overlying lumbosacralsacral radiculopathy on the right.  Jill Alexanders MD 01/17/2014 1:54 PM  [facility] [address] [place], [place] [address]  Phone [phone] Fax [phone]

## 2014-01-22 ENCOUNTER — Encounter: Payer: Self-pay | Admitting: Neurology

## 2014-01-22 ENCOUNTER — Ambulatory Visit (INDEPENDENT_AMBULATORY_CARE_PROVIDER_SITE_OTHER): Payer: Medicare Other | Admitting: *Deleted

## 2014-01-22 ENCOUNTER — Ambulatory Visit (INDEPENDENT_AMBULATORY_CARE_PROVIDER_SITE_OTHER): Payer: Medicare Other | Admitting: Neurology

## 2014-01-22 VITALS — BP 145/84 | HR 64 | Ht 69.75 in | Wt <= 1120 oz

## 2014-01-22 VITALS — BP 140/78 | Temp 98.6°F

## 2014-01-22 DIAGNOSIS — G609 Hereditary and idiopathic neuropathy, unspecified: Secondary | ICD-10-CM

## 2014-01-22 DIAGNOSIS — I6529 Occlusion and stenosis of unspecified carotid artery: Secondary | ICD-10-CM | POA: Diagnosis not present

## 2014-01-22 DIAGNOSIS — E538 Deficiency of other specified B group vitamins: Secondary | ICD-10-CM

## 2014-01-22 DIAGNOSIS — R269 Unspecified abnormalities of gait and mobility: Secondary | ICD-10-CM | POA: Diagnosis not present

## 2014-01-22 DIAGNOSIS — R2681 Unsteadiness on feet: Secondary | ICD-10-CM

## 2014-01-22 MED ORDER — CYANOCOBALAMIN 1000 MCG/ML IJ SOLN
1000.0000 ug | Freq: Once | INTRAMUSCULAR | Status: AC
  Administered 2014-01-22: 1000 ug via INTRAMUSCULAR

## 2014-01-22 NOTE — Patient Instructions (Signed)
Overall you are doing fairly well but I do want to suggest a few things today:   Remember to drink plenty of fluid, eat healthy meals and do not skip any meals. Try to eat protein with a every meal and eat a healthy snack such as fruit or nuts in between meals. Try to keep a regular sleep-wake schedule and try to exercise daily, particularly in the form of walking, 20-30 minutes a day, if you can.   Please have some blood work completed today after you check out.   I would like you to work with physical therapy to help treat your gait instability.   Follow up as needed. Please call us with any interim questions, concerns, problems, updates or refill requests.   We will call you with your lab results.   My clinical assistant and will answer any of your questions and relay your messages to me and also relay most of my messages to you.   Our phone number is [phone]. We also have an after hours call service for urgent matters and there is a physician on-call for urgent questions. For any emergencies you know to call 911 or go to the nearest emergency room

## 2014-01-22 NOTE — Progress Notes (Signed)
[id number]    Provider:  Dr Janann Colonel Referring Provider: [name], MD Primary Care Physician:  Jenny Reichmann, MD  CC:  Gait instability  HPI:  Mark Jackson. is a 80 y.o. male here as a follow up from Dr. Everlene Farrier for gait instability. Last visit 01/13/2104 at which time he had symptoms consistent with a peripheral neuropathy and had an EMG/NCS ordered. NCS showed findings consistent with an axonal neuropathy. Returns today for follow up visit. Continues to have difficulty with gait, no falls since last visit.    Initial visit: Notes symptoms started 3 to 4 years ago. Initially noted poor balance, describes always needing to be near something to grab onto. Has been getting progressively worse, especially since the start of the year. Notes having 2 falls in the past year. States one fall occurred while walking to the bathroom in the night, other fall involved his knee giving out. Notes some fatigue in his legs after prolonged walking, can typically walk around 20 minutes. No burning or tingling in his feet. He does see Dr. Nelva Bush for history of back pain for which he gets occasional steroid injections. Notes having difficulty walking in the dark. No history of stroke or TIA.   Has been diagnosed with anemia in the past. B12 level recently checked and was normal. Has head CT pending.   Review of Systems: Out of a complete 14 system review, the patient complains of only the following symptoms, and all other reviewed systems are negative. + anemia, easy bruising, headache, ringing in ears, urinary problems  History   Social History  . Marital Status: Married    Spouse Name: N/A    Number of Children: N/A  . Years of Education: N/A   Occupational History  . Not on file.   Social History Main Topics  . Smoking status: Former Smoker    Types: Pipe, Landscape architect  . Smokeless tobacco: Never Used  . Alcohol Use: No  . Drug Use: No  . Sexual Activity: Not on file    Other Topics Concern  . Not on file   Social History Narrative   He is about 80 year old overweight gentleman who is married with 4 children, and 11 grandchildren with 2 great-grandchildren. 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   He used to smoke a pipe for several years but quit about 30 some years ago. He does not drink alcohol.          Family History  Problem Relation Age of Onset  . Cancer Mother     ovarin cancer    Past Medical History  Diagnosis Date  . Hypertension   . Prostate ca   . Left ventricular diastolic dysfunction, NYHA class 08 October 2012    Echocardiogram March 2014: EF 55-60%. Moderate concentric LVH. Grade 1 diastolic dysfunction. Very mild aortic stenosis/sclerosis.   . Anemia   . Carotid artery disease     Right carotid 60-80% stenosis; stable from 2013-2014  . Hyperlipidemia   . Chronic kidney disease (CKD), stage III (moderate) B   . Obesity (BMI 30-39.9) 09/03/2013    Past Surgical History  Procedure Laterality Date  . Partial knee arthroplasty Bilateral   . Tonsillectomy and adenoidectomy    . Nm myoview ltd  February 2012    Persantine Myoview: Inferior attenuation artifact but no ischemia or infarction. Normal EF.    Current Outpatient Prescriptions  Medication Sig Dispense Refill  . acetaminophen (TYLENOL) 325 MG tablet Take 650 mg by mouth every 6 (six) hours as needed.      Marland Kitchen amoxicillin (AMOXIL) 875 MG tablet Take 1 tablet (875 mg total) by mouth 2 (two) times daily.  20 tablet  0  . aspirin 81 MG tablet Take 81 mg by mouth daily.      . baclofen (LIORESAL) 10 MG tablet Take 10 mg by mouth as needed.      . diphenhydrAMINE (SOMINEX) 25 MG tablet Take 25 mg by mouth at bedtime as needed. For sleep      . ferrous fumarate (HEMOCYTE - 106 MG FE) 325 (106 FE) MG TABS Take 1 tablet by mouth.      Marland Kitchen HYDROcodone-homatropine (HYCODAN) 5-1.5 MG/5ML syrup Take 5 mLs by mouth every 8  (eight) hours as needed for cough.  120 mL  0  . Ibuprofen (ADVIL PO) Take by mouth.      . Loperamide HCl (ANTI-DIARRHEAL PO) Take by mouth.      . losartan (COZAAR) 50 MG tablet TAKE 1 TABLET EACH DAY.  90 tablet  0  . Multiple Vitamins-Minerals (MULTIVITAMIN WITH MINERALS) tablet Take 1 tablet by mouth daily.      . rosuvastatin (CRESTOR) 5 MG tablet Take 1 tablet (5 mg total) by mouth daily.  90 tablet  3  . topiramate (TOPAMAX) 25 MG tablet Take 25 mg by mouth 2 (two) times daily.       Current Facility-Administered Medications  Medication Dose Route Frequency Provider Last Rate Last Dose  . cyanocobalamin ((VITAMIN B-12)) injection 1,000 mcg  1,000 mcg Intramuscular Q30 days Wardell Honour, MD   1,000 mcg at 12/21/13 1301    Allergies as of 01/22/2014 - Review Complete 01/22/2014  Allergen Reaction Noted  . Plavix [clopidogrel bisulfate]  09/03/2013    Vitals: BP 145/84  Pulse 64  Ht 5' 9.75" (1.772 m)  Wt 19 lb (8.618 kg)  BMI 2.74 kg/m2 Last Weight:  Wt Readings from Last 1 Encounters:  01/22/14 19 lb (8.618 kg)   Last Height:   Ht Readings from Last 1 Encounters:  01/22/14 5' 9.75" (1.772 m)     Physical exam: Exam: Gen: NAD, conversant Eyes: anicteric sclerae, moist conjunctivae HENT: Atraumatic, oropharynx clear Neck: Trachea midline; supple,  Lungs: CTA, no wheezing, rales, rhonic                          CV: RRR, no MRG Abdomen: Soft, non-tender;  Extremities: No peripheral edema  Skin: Normal temperature, no rash,  Psych: Appropriate affect, pleasant  Neuro: MS: AA&Ox3, appropriately interactive, normal affect   Attention: WORLD backwards  Speech: fluent w/o paraphasic error  Memory: good recent and remote recall  CN: PERRL, EOMI no nystagmus, no ptosis, sensation intact to LT V1-V3 bilat, face symmetric, no weakness, hearing grossly intact, palate elevates symmetrically, shoulder shrug 5/5 bilat,  tongue protrudes midline, no fasiculations  noted.  Motor: normal bulk and tone Strength: 5/5  In all extremities  Coord: rapid alternating and point-to-point (FNF, HTS) movements intact.  Reflexes: diminished but symmetrical, bilat downgoing toes  Sens: decreased LT bilateral LE, decreased PP, absent vibration to mid-shin, absent proprioception  Gait: posture, stance, stride and arm-swing normal. Unable to Tandem walk. Wobbles and nearly falls with feet together and eyes closed   Assessment:  After physical and neurologic examination, review of laboratory studies, imaging, neurophysiology testing and pre-existing  records, assessment will be reviewed on the problem list.  Plan:  Treatment plan and additional workup will be reviewed under Problem List.  1)peripheral neuropathy 2)gait instability  80y/o gentleman presenting for follow up evaluation of progressively worsening gait instability. Physical exam pertinent for signs of a peripheral neuropathy, specifically loss of vibration and proprioception. Suspect this is contributing to his gait instability. EMG/NCS consistent with an axonal neuropathy. Will order further blood work at this time. Discussed option of skin/nerve biopsy but will hold off at this time due to low yield. Will refer to PT for gait training. Follow up as needed.    Mark Like, DO  [facility] [address] [place], [place] [address]  Phone [phone] Fax [phone]

## 2014-01-22 NOTE — Progress Notes (Signed)
   Subjective:    Patient ID: Mark Jackson., male    DOB: 07-Jun-1930, 80 y.o.   MRN: [id number]  HPI pt received B 12 injection only.    Review of Systems     Objective:   Physical Exam        Assessment & Plan:

## 2014-01-23 DIAGNOSIS — M545 Low back pain, unspecified: Secondary | ICD-10-CM | POA: Diagnosis not present

## 2014-01-24 LAB — HEAVY METALS, BLOOD
Arsenic: 5 ug/L (ref 2–23)
LEAD, BLOOD: 1 ug/dL (ref 0–19)
Mercury: NOT DETECTED ug/L (ref 0.0–14.9)

## 2014-01-24 LAB — COPPER, SERUM: COPPER: 96 ug/dL (ref 72–166)

## 2014-01-24 LAB — VITAMIN B1, WHOLE BLOOD: Thiamine: 148.5 nmol/L (ref 66.5–200.0)

## 2014-01-24 LAB — TSH: TSH: 3.15 u[IU]/mL (ref 0.450–4.500)

## 2014-01-24 LAB — VITAMIN B6: VITAMIN B6: 22.3 ug/L (ref 5.3–46.7)

## 2014-01-26 NOTE — Progress Notes (Signed)
Quick Note:  Spoke with patient and informed him of normal labs, patient was questioning what was the next step. ______

## 2014-01-30 NOTE — Progress Notes (Signed)
Quick Note:  Spoke with patient and informed him to complete a course of PT gait training, patient states that he had not been contacted, referral was sent over on 01/22/14, patient states that he was going off of your recommendation to not do the nerve biopsy, states that if you want him to have the biopsy he will, but he is leaving this decision up to you to decide. His referral was given to rehab again. ______

## 2014-02-01 NOTE — Progress Notes (Signed)
Quick Note:  Spoke with patient informed him of Dr. Hazle Quant message to complete the physical therapy, then call back and discuss how he is doing, patient understood. ______

## 2014-02-02 ENCOUNTER — Ambulatory Visit: Payer: Medicare Other | Admitting: Physical Therapy

## 2014-02-13 ENCOUNTER — Ambulatory Visit: Payer: Medicare Other | Attending: Neurology | Admitting: Physical Therapy

## 2014-02-13 DIAGNOSIS — R269 Unspecified abnormalities of gait and mobility: Secondary | ICD-10-CM | POA: Diagnosis not present

## 2014-02-13 DIAGNOSIS — Z9181 History of falling: Secondary | ICD-10-CM | POA: Insufficient documentation

## 2014-02-13 DIAGNOSIS — G609 Hereditary and idiopathic neuropathy, unspecified: Secondary | ICD-10-CM | POA: Insufficient documentation

## 2014-02-13 DIAGNOSIS — IMO0001 Reserved for inherently not codable concepts without codable children: Secondary | ICD-10-CM | POA: Diagnosis not present

## 2014-02-13 DIAGNOSIS — M6281 Muscle weakness (generalized): Secondary | ICD-10-CM | POA: Insufficient documentation

## 2014-02-19 ENCOUNTER — Ambulatory Visit: Payer: Medicare Other | Admitting: Physical Therapy

## 2014-02-19 DIAGNOSIS — IMO0001 Reserved for inherently not codable concepts without codable children: Secondary | ICD-10-CM | POA: Diagnosis not present

## 2014-02-21 ENCOUNTER — Ambulatory Visit: Payer: Medicare Other | Admitting: Physical Therapy

## 2014-02-21 ENCOUNTER — Ambulatory Visit (INDEPENDENT_AMBULATORY_CARE_PROVIDER_SITE_OTHER): Payer: Medicare Other | Admitting: *Deleted

## 2014-02-21 ENCOUNTER — Encounter: Payer: Self-pay | Admitting: *Deleted

## 2014-02-21 VITALS — BP 126/80 | Temp 98.0°F

## 2014-02-21 DIAGNOSIS — E538 Deficiency of other specified B group vitamins: Secondary | ICD-10-CM | POA: Diagnosis not present

## 2014-02-21 DIAGNOSIS — IMO0001 Reserved for inherently not codable concepts without codable children: Secondary | ICD-10-CM | POA: Diagnosis not present

## 2014-02-21 MED ORDER — CYANOCOBALAMIN 1000 MCG/ML IJ SOLN
1000.0000 ug | Freq: Once | INTRAMUSCULAR | Status: AC
  Administered 2014-02-21: 1000 ug via INTRAMUSCULAR

## 2014-02-21 NOTE — Progress Notes (Signed)
   Subjective:    Patient ID: Mark Jackson., male    DOB: [birth year], 80 y.o.   MRN: [id number]  HPI  Pt here for vit B 12 injection only.    Review of Systems     Objective:   Physical Exam        Assessment & Plan:

## 2014-02-26 ENCOUNTER — Ambulatory Visit: Payer: Medicare Other | Admitting: Physical Therapy

## 2014-02-26 DIAGNOSIS — IMO0001 Reserved for inherently not codable concepts without codable children: Secondary | ICD-10-CM | POA: Diagnosis not present

## 2014-02-27 ENCOUNTER — Ambulatory Visit (HOSPITAL_COMMUNITY)
Admission: RE | Admit: 2014-02-27 | Discharge: 2014-02-27 | Disposition: A | Discharge status: Home / Self Care | Payer: Medicare Other | Source: Ambulatory Visit | Attending: Cardiovascular Disease | Admitting: Cardiovascular Disease

## 2014-02-27 DIAGNOSIS — I6521 Occlusion and stenosis of right carotid artery: Secondary | ICD-10-CM

## 2014-02-27 DIAGNOSIS — I6529 Occlusion and stenosis of unspecified carotid artery: Secondary | ICD-10-CM | POA: Insufficient documentation

## 2014-02-27 NOTE — Progress Notes (Signed)
Carotid Duplex Completed. °Brianna L Mazza,RVT °

## 2014-02-28 ENCOUNTER — Ambulatory Visit: Payer: Medicare Other | Admitting: Physical Therapy

## 2014-02-28 DIAGNOSIS — IMO0001 Reserved for inherently not codable concepts without codable children: Secondary | ICD-10-CM | POA: Diagnosis not present

## 2014-03-02 ENCOUNTER — Encounter: Payer: Self-pay | Admitting: *Deleted

## 2014-03-05 ENCOUNTER — Ambulatory Visit: Payer: Medicare Other | Admitting: Physical Therapy

## 2014-03-05 DIAGNOSIS — IMO0001 Reserved for inherently not codable concepts without codable children: Secondary | ICD-10-CM | POA: Diagnosis not present

## 2014-03-07 ENCOUNTER — Ambulatory Visit: Payer: Medicare Other | Admitting: Physical Therapy

## 2014-03-07 DIAGNOSIS — IMO0001 Reserved for inherently not codable concepts without codable children: Secondary | ICD-10-CM | POA: Diagnosis not present

## 2014-03-12 ENCOUNTER — Ambulatory Visit: Payer: Medicare Other | Attending: Neurology | Admitting: Physical Therapy

## 2014-03-12 DIAGNOSIS — G609 Hereditary and idiopathic neuropathy, unspecified: Secondary | ICD-10-CM | POA: Diagnosis not present

## 2014-03-12 DIAGNOSIS — IMO0001 Reserved for inherently not codable concepts without codable children: Secondary | ICD-10-CM | POA: Diagnosis not present

## 2014-03-12 DIAGNOSIS — M6281 Muscle weakness (generalized): Secondary | ICD-10-CM | POA: Insufficient documentation

## 2014-03-12 DIAGNOSIS — Z9181 History of falling: Secondary | ICD-10-CM | POA: Insufficient documentation

## 2014-03-12 DIAGNOSIS — R269 Unspecified abnormalities of gait and mobility: Secondary | ICD-10-CM | POA: Diagnosis not present

## 2014-03-13 ENCOUNTER — Ambulatory Visit (INDEPENDENT_AMBULATORY_CARE_PROVIDER_SITE_OTHER): Payer: Medicare Other | Admitting: Cardiovascular Disease

## 2014-03-13 ENCOUNTER — Encounter: Payer: Self-pay | Admitting: Cardiovascular Disease

## 2014-03-13 VITALS — BP 136/82 | HR 68 | Ht 69.0 in | Wt 202.5 lb

## 2014-03-13 DIAGNOSIS — I6529 Occlusion and stenosis of unspecified carotid artery: Secondary | ICD-10-CM | POA: Diagnosis not present

## 2014-03-13 DIAGNOSIS — I779 Disorder of arteries and arterioles, unspecified: Secondary | ICD-10-CM

## 2014-03-13 DIAGNOSIS — E785 Hyperlipidemia, unspecified: Secondary | ICD-10-CM | POA: Diagnosis not present

## 2014-03-13 DIAGNOSIS — I10 Essential (primary) hypertension: Secondary | ICD-10-CM

## 2014-03-13 DIAGNOSIS — I739 Peripheral vascular disease, unspecified: Secondary | ICD-10-CM

## 2014-03-13 NOTE — Patient Instructions (Signed)
Your physician wants you to follow-up in: 1 year with Dr Berry. You will receive a reminder letter in the mail two months in advance. If you don't receive a letter, please call our office to schedule the follow-up appointment.  

## 2014-03-13 NOTE — Progress Notes (Signed)
03/13/2014 Autumn Patty Jr.   July 19, 1930  [id number]  Primary Physician [name], [name], MD Primary Cardiologist: Lorretta Harp MD [name]   HPI:  The patient is a 80 year old mildly overweight married Caucasian male father of 50, grandfather to 16 grandchildren who is a patient of Dr. Allison Quarry and who I see for carotid disease. I saw him 12 months ago. His risk factors include hypertension and mild hyperlipidemia. He has moderately severe right ICA stenosis which has remained stable by duplex ultrasound over the last year. He was on aspirin and Plavix and discontinued the Plavix on his own because of bruising and bleeding. He has had a normal 2D echo and a negative Myoview back 2 year ago. Since  I last saw him he has been totally asymptomatic. Recent carotid Dopplers showed no evidence of progression. We'll continue to follow him noninvasively    Current Outpatient Prescriptions  Medication Sig Dispense Refill  . acetaminophen (TYLENOL) 325 MG tablet Take 650 mg by mouth every 6 (six) hours as needed.      Marland Kitchen amoxicillin (AMOXIL) 875 MG tablet Take 1 tablet (875 mg total) by mouth 2 (two) times daily.  20 tablet  0  . aspirin 81 MG tablet Take 81 mg by mouth daily.      . baclofen (LIORESAL) 10 MG tablet Take 10 mg by mouth as needed.      . ferrous fumarate (HEMOCYTE - 106 MG FE) 325 (106 FE) MG TABS Take 1 tablet by mouth.      Marland Kitchen HYDROcodone-homatropine (HYCODAN) 5-1.5 MG/5ML syrup Take 5 mLs by mouth every 8 (eight) hours as needed for cough.  120 mL  0  . Ibuprofen (ADVIL PO) Take by mouth.      . Loperamide HCl (ANTI-DIARRHEAL PO) Take by mouth.      . losartan (COZAAR) 50 MG tablet TAKE 1 TABLET EACH DAY.  90 tablet  0  . Multiple Vitamins-Minerals (MULTIVITAMIN WITH MINERALS) tablet Take 1 tablet by mouth daily.      . rosuvastatin (CRESTOR) 5 MG tablet Take 1 tablet (5 mg total) by mouth daily.  90 tablet  3  . topiramate (TOPAMAX) 25 MG tablet Take 25 mg by  mouth 2 (two) times daily.       Current Facility-Administered Medications  Medication Dose Route Frequency Provider Last Rate Last Dose  . cyanocobalamin ((VITAMIN B-12)) injection 1,000 mcg  1,000 mcg Intramuscular Q30 days Wardell Honour, MD   1,000 mcg at 12/21/13 1301    Allergies  Allergen Reactions  . Plavix [Clopidogrel Bisulfate]     Significant bruising    History   Social History  . Marital Status: Married    Spouse Name: N/A    Number of Children: N/A  . Years of Education: N/A   Occupational History  . Not on file.   Social History Main Topics  . Smoking status: Former Smoker    Types: Pipe, Landscape architect  . Smokeless tobacco: Never Used  . Alcohol Use: No  . Drug Use: No  . Sexual Activity: Not on file   Other Topics Concern  . Not on file   Social History Narrative   He is about 80 year old overweight gentleman who is married with 4 children, and 11 grandchildren with 2 great-grandchildren. He is the Namesake date [employer] and the family of [employer] which is a local [employer].   He used to smoke a pipe for several years but quit  about 30 some years ago. He does not drink alcohol.           Review of Systems: General: negative for chills, fever, night sweats or weight changes.  Cardiovascular: negative for chest pain, dyspnea on exertion, edema, orthopnea, palpitations, paroxysmal nocturnal dyspnea or shortness of breath Dermatological: negative for rash Respiratory: negative for cough or wheezing Urologic: negative for hematuria Abdominal: negative for nausea, vomiting, diarrhea, bright red blood per rectum, melena, or hematemesis Neurologic: negative for visual changes, syncope, or dizziness All other systems reviewed and are otherwise negative except as noted above.    Blood pressure 136/82, pulse 68, height 5\' 9"  (1.753 m), weight 202 lb 8 oz (91.853 kg).  General appearance: alert and no distress Neck: no adenopathy, no JVD, supple,  symmetrical, trachea midline, thyroid not enlarged, symmetric, no tenderness/mass/nodules and soft bilateral carotid bruits Lungs: clear to auscultation bilaterally Heart: regular rate and rhythm, S1, S2 normal, no murmur, click, rub or gallop Extremities: extremities normal, atraumatic, no cyanosis or edema  EKG normal sinus rhythm at 68 without ST or T wave changes  ASSESSMENT AND PLAN:   Hyperlipidemia On statin therapy with recent lipid profile performed 08/15/13 revealing a total cholesterol 114, LDL 64 and HDL of 37  HYPERTENSION Controlled on current medications  Carotid artery disease Moderate right ICA stenosis unchanged by duplex ultrasound. We will continue to follow annually.      Lorretta Harp MD FACP,FACC,FAHA, [facility] 03/13/2014 3:23 PM

## 2014-03-13 NOTE — Assessment & Plan Note (Signed)
On statin therapy with recent lipid profile performed 08/15/13 revealing a total cholesterol 114, LDL 64 and HDL of 37

## 2014-03-13 NOTE — Assessment & Plan Note (Signed)
Controlled on current medications 

## 2014-03-13 NOTE — Assessment & Plan Note (Signed)
Moderate right ICA stenosis unchanged by duplex ultrasound. We will continue to follow annually.

## 2014-03-14 ENCOUNTER — Ambulatory Visit: Payer: Medicare Other | Admitting: Physical Therapy

## 2014-03-14 DIAGNOSIS — IMO0001 Reserved for inherently not codable concepts without codable children: Secondary | ICD-10-CM | POA: Diagnosis not present

## 2014-03-21 ENCOUNTER — Ambulatory Visit: Payer: Medicare Other | Admitting: Physical Therapy

## 2014-03-21 DIAGNOSIS — IMO0001 Reserved for inherently not codable concepts without codable children: Secondary | ICD-10-CM | POA: Diagnosis not present

## 2014-03-23 ENCOUNTER — Ambulatory Visit (INDEPENDENT_AMBULATORY_CARE_PROVIDER_SITE_OTHER): Payer: Medicare Other | Admitting: Family Medicine

## 2014-03-23 ENCOUNTER — Telehealth: Payer: Self-pay | Admitting: Neurology

## 2014-03-23 ENCOUNTER — Other Ambulatory Visit: Payer: Self-pay | Admitting: Cardiovascular Disease

## 2014-03-23 DIAGNOSIS — E538 Deficiency of other specified B group vitamins: Secondary | ICD-10-CM

## 2014-03-23 NOTE — Telephone Encounter (Signed)
Rx was sent to pharmacy electronically. 

## 2014-03-23 NOTE — Telephone Encounter (Signed)
Patient was instructed to call us back once he had finished therapy, if questions please call and advise.

## 2014-03-23 NOTE — Progress Notes (Signed)
   Subjective:    Patient ID: Mark Jackson., male    DOB: 1929-12-20, 80 y.o.   MRN: [id number]  HPI patient here for b12 injection only    Review of Systems     Objective:   Physical Exam        Assessment & Plan:

## 2014-03-24 ENCOUNTER — Other Ambulatory Visit: Payer: Self-pay | Admitting: Emergency Medicine

## 2014-03-26 ENCOUNTER — Ambulatory Visit: Payer: Medicare Other | Admitting: Physical Therapy

## 2014-03-27 ENCOUNTER — Ambulatory Visit (INDEPENDENT_AMBULATORY_CARE_PROVIDER_SITE_OTHER): Payer: Medicare Other | Admitting: Family Medicine

## 2014-03-27 VITALS — BP 144/88 | HR 64 | Temp 97.6°F | Resp 16 | Ht 68.5 in | Wt 201.8 lb

## 2014-03-27 DIAGNOSIS — R21 Rash and other nonspecific skin eruption: Secondary | ICD-10-CM | POA: Diagnosis not present

## 2014-03-27 DIAGNOSIS — I6529 Occlusion and stenosis of unspecified carotid artery: Secondary | ICD-10-CM

## 2014-03-27 MED ORDER — CLOTRIMAZOLE-BETAMETHASONE 1-0.05 % EX CREA: 1.0000 "application " | TOPICAL_CREAM | Freq: Two times a day (BID) | CUTANEOUS | Status: DC

## 2014-03-27 MED ORDER — CEPHALEXIN 500 MG PO CAPS: 500.0000 mg | ORAL_CAPSULE | Freq: Two times a day (BID) | ORAL | Status: DC

## 2014-03-27 NOTE — Progress Notes (Signed)
Patient ID: Mark Jackson. MRN: [id number], DOB: [birth year], 80 y.o. Date of Encounter: 03/27/2014, 6:46 PM  Primary Physician: Mark Reichmann, MD  Chief Complaint:  Chief Complaint  Patient presents with  . Rash    rash all around his waist that itches x 8+ weeks.  he stated this rash is now changing colors.      HPI: 80 y.o. year old male with history  Pt is complaining of a rash all around his waist that started 8 weeks ago. He states that his rash is now changing colors. Pt states that his rash itches a lot. Wife states that pt has been under a lot of stress and it may be causing adverse effects to his health. Mark Jackson denies any other pertinent medical history. He denies any fever, chills, nausea, emesis, abdominal pain, SOB, chest pain, or headaches.    Past Medical History  Diagnosis Date  . Hypertension   . Prostate ca   . Left ventricular diastolic dysfunction, NYHA class 08 October 2012    Echocardiogram March 2014: EF 55-60%. Moderate concentric LVH. Grade 1 diastolic dysfunction. Very mild aortic stenosis/sclerosis.   . Anemia   . Carotid artery disease     Right carotid 60-80% stenosis; stable from 2013-2014  . Hyperlipidemia   . Chronic kidney disease (CKD), stage III (moderate) B   . Obesity (BMI 30-39.9) 09/03/2013     Home Meds: Prior to Admission medications   Medication Sig Start Date End Date Taking? Authorizing Provider  acetaminophen (TYLENOL) 325 MG tablet Take 650 mg by mouth every 6 (six) hours as needed.   Yes Historical Provider, MD  amoxicillin (AMOXIL) 875 MG tablet Take 1 tablet (875 mg total) by mouth 2 (two) times daily. 10/09/13  Yes Mark Haber, MD  aspirin 81 MG tablet Take 81 mg by mouth daily.   Yes Historical Provider, MD  baclofen (LIORESAL) 10 MG tablet Take 10 mg by mouth as needed.   Yes Historical Provider, MD  CRESTOR 5 MG tablet TAKE 1 TABLET ONCE DAILY. 03/23/14  Yes Lorretta Harp, MD  ferrous fumarate (HEMOCYTE - 106 MG FE)  325 (106 FE) MG TABS Take 1 tablet by mouth.   Yes Historical Provider, MD  HYDROcodone-homatropine (HYCODAN) 5-1.5 MG/5ML syrup Take 5 mLs by mouth every 8 (eight) hours as needed for cough. 07/25/13  Yes [name], MD  Ibuprofen (ADVIL PO) Take by mouth.   Yes Historical Provider, MD  Loperamide HCl (ANTI-DIARRHEAL PO) Take by mouth.   Yes Historical Provider, MD  losartan (COZAAR) 50 MG tablet TAKE 1 TABLET EACH DAY. 03/26/14  Yes [name], MD  Multiple Vitamins-Minerals (MULTIVITAMIN WITH MINERALS) tablet Take 1 tablet by mouth daily.   Yes Historical Provider, MD  topiramate (TOPAMAX) 25 MG tablet Take 25 mg by mouth 2 (two) times daily.   Yes Historical Provider, MD    Allergies:  Allergies  Allergen Reactions  . Plavix [Clopidogrel Bisulfate]     Significant bruising    History   Social History  . Marital Status: Married    Spouse Name: N/A    Number of Children: N/A  . Years of Education: N/A   Occupational History  . Not on file.   Social History Main Topics  . Smoking status: Former Smoker    Types: Pipe, Landscape architect  . Smokeless tobacco: Never Used  . Alcohol Use: No  . Drug Use: No  . Sexual Activity: Not on file  Other Topics Concern  . Not on file   Social History Narrative   He is about 80 year old overweight gentleman who is married with 4 children, and 11 grandchildren with 2 great-grandchildren. He is the Namesake date [employer] and the family of [employer] which is a local [place].   He used to smoke a pipe for several years but quit about 30 some years ago. He does not drink alcohol.           Review of Systems: positive for rash.  Constitutional: negative for chills, fever, night sweats, weight changes, or fatigue  HEENT: negative for vision changes, hearing loss, congestion, rhinorrhea, ST, epistaxis, or sinus pressure Cardiovascular: negative for chest pain or palpitations Respiratory: negative for hemoptysis, wheezing, shortness of  breath, or cough Abdominal: negative for abdominal pain, nausea, vomiting, diarrhea, or constipation Dermatological: negative for rash Neurologic: negative for headache, dizziness, or syncope All other systems reviewed and are otherwise negative with the exception to those above and in the HPI.   Physical Exam:  Blood pressure 144/88, pulse 64, temperature 97.6 F (36.4 C), temperature source Oral, resp. rate 16, height 5' 8.5" (1.74 m), weight 201 lb 12.8 oz (91.536 kg), SpO2 95.00%., Body mass index is 30.23 kg/(m^2). General: Well developed, well nourished, in no acute distress. Head: Normocephalic, atraumatic, eyes without discharge, sclera non-icteric, nares are without discharge. Bilateral auditory canals clear, TM's are without perforation, pearly grey and translucent with reflective cone of light bilaterally. Oral cavity moist, posterior pharynx without exudate, erythema, peritonsillar abscess, or post nasal drip.  Neck: Supple. No thyromegaly. Full ROM. No lymphadenopathy. Lungs: Clear bilaterally to auscultation without wheezes, rales, or rhonchi. Breathing is unlabored. Heart: RRR with S1 S2. No murmurs, rubs, or gallops appreciated. Abdomen: Soft, non-tender, non-distended with normoactive bowel sounds. No hepatomegaly. No rebound/guarding. No obvious abdominal masses. Msk:  Strength and tone normal for age. Extremities/Skin: Warm and dry. No clubbing or cyanosis. No edema. No rashes or suspicious lesions. Neuro: Alert and oriented X 3. Moves all extremities spontaneously. Gait is normal. CNII-XII grossly in tact. Psych:  Responds to questions appropriately with a normal affect.    ASSESSMENT AND PLAN:  80 y.o. year old male with Rash and other nonspecific skin eruption - Plan: clotrimazole-betamethasone (LOTRISONE) cream, cephALEXin (KEFLEX) 500 MG capsule   This may also be a neurodermatitis as it is nondescript, persistent, related to stress and anxiety. I personally  performed the services described in this documentation, which was scribed in my presence. The recorded information has been reviewed and is accurate.  Signed, Mark Haber, MD 03/27/2014 6:46 PM

## 2014-03-28 ENCOUNTER — Encounter: Payer: Medicare Other | Admitting: Physical Therapy

## 2014-03-30 NOTE — Telephone Encounter (Signed)
This is an Pharmacist, hospital, see previous message.

## 2014-04-02 ENCOUNTER — Encounter: Payer: Medicare Other | Admitting: Physical Therapy

## 2014-04-04 ENCOUNTER — Encounter: Payer: Medicare Other | Admitting: Physical Therapy

## 2014-04-09 ENCOUNTER — Ambulatory Visit: Payer: Medicare Other | Admitting: Physical Therapy

## 2014-04-11 ENCOUNTER — Encounter: Payer: Medicare Other | Admitting: Physical Therapy

## 2014-04-23 ENCOUNTER — Ambulatory Visit (INDEPENDENT_AMBULATORY_CARE_PROVIDER_SITE_OTHER): Payer: Medicare Other

## 2014-04-23 VITALS — BP 162/78 | Temp 97.6°F

## 2014-04-23 DIAGNOSIS — E538 Deficiency of other specified B group vitamins: Secondary | ICD-10-CM | POA: Diagnosis not present

## 2014-04-23 MED ORDER — CYANOCOBALAMIN 1000 MCG/ML IJ SOLN
1000.0000 ug | Freq: Once | INTRAMUSCULAR | Status: AC
  Administered 2014-04-23: 1000 ug via INTRAMUSCULAR

## 2014-04-23 NOTE — Progress Notes (Signed)
   Subjective:    Patient ID: Mark Jackson., male    DOB: 1930-07-20, 80 y.o.   MRN: [id number]  HPI   Patient here for B-12 injection only. Review of Systems     Objective:   Physical Exam        Assessment & Plan:

## 2014-04-24 NOTE — Telephone Encounter (Signed)
Opened in error

## 2014-05-08 DIAGNOSIS — C61 Malignant neoplasm of prostate: Secondary | ICD-10-CM | POA: Diagnosis not present

## 2014-05-10 ENCOUNTER — Ambulatory Visit: Payer: Medicare Other | Admitting: Emergency Medicine

## 2014-05-14 DIAGNOSIS — C61 Malignant neoplasm of prostate: Secondary | ICD-10-CM | POA: Diagnosis not present

## 2014-05-15 ENCOUNTER — Encounter: Payer: Self-pay | Admitting: Emergency Medicine

## 2014-05-15 ENCOUNTER — Ambulatory Visit (INDEPENDENT_AMBULATORY_CARE_PROVIDER_SITE_OTHER): Payer: Medicare Other | Admitting: Emergency Medicine

## 2014-05-15 VITALS — BP 174/87 | HR 58 | Temp 97.7°F | Resp 14 | Ht 68.5 in | Wt 202.0 lb

## 2014-05-15 DIAGNOSIS — R609 Edema, unspecified: Secondary | ICD-10-CM

## 2014-05-15 DIAGNOSIS — E538 Deficiency of other specified B group vitamins: Secondary | ICD-10-CM

## 2014-05-15 DIAGNOSIS — I10 Essential (primary) hypertension: Secondary | ICD-10-CM | POA: Diagnosis not present

## 2014-05-15 DIAGNOSIS — Z23 Encounter for immunization: Secondary | ICD-10-CM

## 2014-05-15 DIAGNOSIS — I6529 Occlusion and stenosis of unspecified carotid artery: Secondary | ICD-10-CM | POA: Diagnosis not present

## 2014-05-15 LAB — CBC WITH DIFFERENTIAL/PLATELET
BASOS ABS: 0 10*3/uL (ref 0.0–0.1)
Basophils Relative: 0 % (ref 0–1)
EOS PCT: 6 % — AB (ref 0–5)
Eosinophils Absolute: 0.6 10*3/uL (ref 0.0–0.7)
HCT: 37.2 % — ABNORMAL LOW (ref 39.0–52.0)
Hemoglobin: 12.5 g/dL — ABNORMAL LOW (ref 13.0–17.0)
Lymphocytes Relative: 17 % (ref 12–46)
Lymphs Abs: 1.7 10*3/uL (ref 0.7–4.0)
MCH: 31.9 pg (ref 26.0–34.0)
MCHC: 33.6 g/dL (ref 30.0–36.0)
MCV: 94.9 fL (ref 78.0–100.0)
Monocytes Absolute: 0.9 10*3/uL (ref 0.1–1.0)
Monocytes Relative: 9 % (ref 3–12)
Neutro Abs: 6.7 10*3/uL (ref 1.7–7.7)
Neutrophils Relative %: 68 % (ref 43–77)
Platelets: 264 10*3/uL (ref 150–400)
RBC: 3.92 MIL/uL — ABNORMAL LOW (ref 4.22–5.81)
RDW: 13.8 % (ref 11.5–15.5)
WBC: 9.8 10*3/uL (ref 4.0–10.5)

## 2014-05-15 LAB — BASIC METABOLIC PANEL WITH GFR
BUN: 34 mg/dL — ABNORMAL HIGH (ref 6–23)
CO2: 24 mEq/L (ref 19–32)
CREATININE: 1.79 mg/dL — AB (ref 0.50–1.35)
Calcium: 8.5 mg/dL (ref 8.4–10.5)
Chloride: 109 mEq/L (ref 96–112)
GFR, EST AFRICAN AMERICAN: 40 mL/min — AB
GFR, EST NON AFRICAN AMERICAN: 35 mL/min — AB
Glucose, Bld: 101 mg/dL — ABNORMAL HIGH (ref 70–99)
Potassium: 4.5 mEq/L (ref 3.5–5.3)
SODIUM: 142 meq/L (ref 135–145)

## 2014-05-15 MED ORDER — HYDROCHLOROTHIAZIDE 12.5 MG PO CAPS: 12.5000 mg | ORAL_CAPSULE | Freq: Every day | ORAL | Status: DC

## 2014-05-15 NOTE — Progress Notes (Signed)
   Subjective:    Patient ID: Mark Jackson., male    DOB: 11/15/1929, 80 y.o.   MRN: [id number] This chart was scribed for Mark Queen, MD by Zola Button, Medical Scribe. This patient was seen in room 21 and the patient's care was started at 11:04 AM.   HPI HPI Comments: Mark Jackson. is a 80 y.o. male with a hx of HTN who presents to the [facility] for a follow-up. Patient states that he has been doing well. He measured his blood pressure at 173/84 yesterday. He has been doing about 5.5 walks a week on average. He has not needed any walking assistance, but he still carries a walker around just in case. Patient is scheduled for a flu shot in 2 week and had his first PNA shot 2 years ago. He is due for his Vitamin B12 shot in 8 days. He sometimes notes SOB upon activity, but he has been keeping it under control. He has been seeing his cardiologist regularly. Patient also notes frequent urination at night, as many as 6 times a night, and has caused him some sleep issues. He works from 8:30am until 5:00pm every day, but he does not work weekends. Patient reports having eaten earlier today; he had a leftover omelet. He also notes weight gain, eating more sweets lately. His wife has had some mild memory issues, mild weakness and some wheezes. Patient denies CP. He has NKDA.     Review of Systems  Constitutional: Positive for unexpected weight change (increase).  Respiratory: Positive for shortness of breath.   Cardiovascular: Negative for chest pain.  Genitourinary: Positive for frequency.  Psychiatric/Behavioral: Positive for sleep disturbance.  All other systems reviewed and are negative.      Objective:   Physical Exam CONSTITUTIONAL: Well developed/well nourished HEAD: Normocephalic/atraumatic EYES: EOM/PERRL ENMT: Mucous membranes moist NECK: supple no meningeal signs SPINE: entire spine nontender CV: S1/S2 noted, no murmurs/rubs/gallops noted LUNGS: Lungs  are clear to auscultation bilaterally, no apparent distress ABDOMEN: soft, nontender, no rebound or guarding GU: no cva tenderness NEURO: Pt is awake/alert, moves all extremitiesx4 EXTREMITIES: pulses normal, full ROM there is 2+ edema of the lower extremeties SKIN: warm, color normal PSYCH: no abnormalities of mood noted        Assessment & Plan:  He is going to get his flu shot at work. He looks good today he is due for his B12 shot next week. 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 He does have some edema of his lower extremities and blood pressure is not acceptable so I added HCTZ 12.5 one a day and recheck in about 6 weeks.I personally performed the services described in this documentation, which was scribed in my presence. The recorded information has been reviewed and is accurate.

## 2014-05-24 ENCOUNTER — Ambulatory Visit (INDEPENDENT_AMBULATORY_CARE_PROVIDER_SITE_OTHER): Payer: Medicare Other | Admitting: *Deleted

## 2014-05-24 ENCOUNTER — Telehealth: Payer: Self-pay | Admitting: *Deleted

## 2014-05-24 VITALS — BP 130/66 | Temp 97.9°F

## 2014-05-24 DIAGNOSIS — E538 Deficiency of other specified B group vitamins: Secondary | ICD-10-CM | POA: Diagnosis not present

## 2014-05-24 MED ORDER — CYANOCOBALAMIN 1000 MCG/ML IJ SOLN
1000.0000 ug | INTRAMUSCULAR | Status: AC
  Administered 2014-05-24 – 2015-03-28 (×7): 1000 ug via INTRAMUSCULAR

## 2014-05-24 NOTE — Telephone Encounter (Signed)
Pt in the office today for his B 12 injection. He stated that he can't sleep with the "water pill" and stopped taking it. Pt was prescribed HCTZ 12.5 mg on 05/15/2014. He said Dr. Everlene Farrier can call in a different medicine to his pharmacy if he wants to switch.

## 2014-05-24 NOTE — Progress Notes (Signed)
   Subjective:    Patient ID: Mark Harris., male    DOB: 04-Feb-1930, 80 y.o.   MRN: [id number]  HPI pt here for B 12 injection only.    Review of Systems     Objective:   Physical Exam        Assessment & Plan:

## 2014-05-25 NOTE — Telephone Encounter (Signed)
Pt.notified

## 2014-05-25 NOTE — Telephone Encounter (Signed)
Call patient tell them just to hold off on the added blood pressure medication. We will discuss adding a new medication at his next office visit. It is okay for him to stay off the HCTZ.

## 2014-06-26 ENCOUNTER — Other Ambulatory Visit: Payer: Self-pay | Admitting: Emergency Medicine

## 2014-06-26 ENCOUNTER — Encounter: Payer: Self-pay | Admitting: Emergency Medicine

## 2014-06-26 ENCOUNTER — Ambulatory Visit (INDEPENDENT_AMBULATORY_CARE_PROVIDER_SITE_OTHER): Payer: Medicare Other | Admitting: Emergency Medicine

## 2014-06-26 VITALS — BP 138/78 | HR 80 | Temp 98.5°F | Resp 18 | Ht 68.0 in | Wt 199.0 lb

## 2014-06-26 DIAGNOSIS — I6529 Occlusion and stenosis of unspecified carotid artery: Secondary | ICD-10-CM

## 2014-06-26 DIAGNOSIS — N289 Disorder of kidney and ureter, unspecified: Secondary | ICD-10-CM

## 2014-06-26 DIAGNOSIS — I10 Essential (primary) hypertension: Secondary | ICD-10-CM | POA: Diagnosis not present

## 2014-06-26 DIAGNOSIS — E538 Deficiency of other specified B group vitamins: Secondary | ICD-10-CM | POA: Diagnosis not present

## 2014-06-26 MED ORDER — LOSARTAN POTASSIUM 50 MG PO TABS: ORAL_TABLET | ORAL | Status: DC

## 2014-06-26 NOTE — Progress Notes (Signed)
Subjective:  This chart was scribed for Mark Russian, MD by Chester Holstein, ED Scribe. This patient was seen in room 21 and the patient's care was started at 2:56 PM.    Patient ID: Mark Jackson., male    DOB: 06/25/1930, 80 y.o.   MRN: [id number]  HPI  HPI Comments: [name]. is a 80 y.o. male who presents to the [facility] for his 6 week follow-up.  He states he has been taking his Microzide 12.5 mg and his readings have been better. He says that he thinks he has been drinking enough fluid with the pill. Pt took [place] vacation at the beach following his last visit and he thoroughly enjoyed his time away. He states he is also sleeping better. He would like to receive his B12 shot today as he is 2 days overdue.    Past Medical History  Diagnosis Date  . Hypertension   . Prostate ca   . Left ventricular diastolic dysfunction, NYHA class 08 October 2012    Echocardiogram March 2014: EF 55-60%. Moderate concentric LVH. Grade 1 diastolic dysfunction. Very mild aortic stenosis/sclerosis.   . Anemia   . Carotid artery disease     Right carotid 60-80% stenosis; stable from 2013-2014  . Hyperlipidemia   . Chronic kidney disease (CKD), stage III (moderate) B   . Obesity (BMI 30-39.9) 09/03/2013    Past Surgical History  Procedure Laterality Date  . Partial knee arthroplasty Bilateral   . Tonsillectomy and adenoidectomy    . Nm myoview ltd  February 2012    Persantine Myoview: Inferior attenuation artifact but no ischemia or infarction. Normal EF.  . Carotid doppler  10/21/2012    Right bulb 50-69% diameter reduction; Right proximal ICA 70-99% diameter reduction; left ICA 0-49% diameter reduction.  . Cardiovascular stress test  10/03/2010    Perfusion defect in the inferior myocardial region consistent with diaphragmatic attenuation. Remaining myocardium demonstrates normal myocardial perfusion with no evidence of ischemia or infarct. No ECG changes. EKG  negative for ischemia.  . Transthoracic echocardiogram  10/21/2012    EF 55-60%. Moderate concentric hypertrophy, grade 1 diastolic dysfunction    Family History  Problem Relation Age of Onset  . Cancer Mother     ovarin cancer    History   Social History  . Marital Status: Married    Spouse Name: N/A    Number of Children: N/A  . Years of Education: N/A   Occupational History  . Not on file.   Social History Main Topics  . Smoking status: Former Smoker    Types: Pipe, Landscape architect  . Smokeless tobacco: Never Used  . Alcohol Use: No  . Drug Use: No  . Sexual Activity: Not on file   Other Topics Concern  . Not on file   Social History Narrative   He is about 80 year old overweight gentleman who is married with 4 children, and 11 grandchildren with 2 great-grandchildren. He is the Namesake date [employer] and the family of [employer] which is a local [place].   He used to smoke a pipe for several years but quit about 30 some years ago. He does not drink alcohol.          Allergies  Allergen Reactions  . Plavix [Clopidogrel Bisulfate]     Significant bruising       Review of Systems     Objective:   Physical Exam  Constitutional: He  is oriented to person, place, and time. He appears well-developed and well-nourished.  HENT:  Head: Normocephalic and atraumatic.  Eyes: Conjunctivae are normal.  Neck: Normal range of motion. Neck supple.  Cardiovascular: Normal rate, regular rhythm and normal heart sounds.   No murmur heard. Pulmonary/Chest: Effort normal and breath sounds normal.  Musculoskeletal: Normal range of motion.  Neurological: He is alert and oriented to person, place, and time.  Skin: Skin is warm and dry.  Psychiatric: He has a normal mood and affect. His behavior is normal.  Nursing note and vitals reviewed.    BP in room- 130/76    BP 138/78 mmHg  Pulse 80  Temp(Src) 98.5 F (36.9 C)  Resp 18  Ht 5\' 8"  (1.727 m)  Wt 199 lb (90.266 kg)   BMI 30.26 kg/m2  SpO2 98%   Assessment & Plan:  Patient's blood pressure is much improved after restarting his blood pressure medication. Re-check in 3 months. Check a Basic Metabolic Panel at that time.I personally performed the services described in this documentation, which was scribed in my presence. The recorded information has been reviewed and is accurate.

## 2014-06-27 ENCOUNTER — Encounter: Payer: Self-pay | Admitting: Neurology

## 2014-07-03 ENCOUNTER — Encounter: Payer: Self-pay | Admitting: Neurology

## 2014-07-19 ENCOUNTER — Telehealth: Payer: Self-pay | Admitting: Family Medicine

## 2014-07-19 NOTE — Telephone Encounter (Signed)
Patient will be receiving flu shot at his place of work.

## 2014-07-26 ENCOUNTER — Ambulatory Visit (INDEPENDENT_AMBULATORY_CARE_PROVIDER_SITE_OTHER): Payer: Medicare Other | Admitting: Radiology

## 2014-07-26 DIAGNOSIS — E538 Deficiency of other specified B group vitamins: Secondary | ICD-10-CM

## 2014-08-14 DIAGNOSIS — M5136 Other intervertebral disc degeneration, lumbar region: Secondary | ICD-10-CM | POA: Diagnosis not present

## 2014-08-20 ENCOUNTER — Ambulatory Visit (INDEPENDENT_AMBULATORY_CARE_PROVIDER_SITE_OTHER): Payer: Medicare Other | Admitting: Internal Medicine

## 2014-08-20 ENCOUNTER — Inpatient Hospital Stay (HOSPITAL_COMMUNITY)
Admission: EM | Admit: 2014-08-20 | Discharge: 2014-08-24 | DRG: 308 | Disposition: A | Discharge status: Home / Self Care | Payer: Medicare Other | Attending: Internal Medicine | Admitting: Internal Medicine

## 2014-08-20 ENCOUNTER — Emergency Department (HOSPITAL_COMMUNITY): Payer: Medicare Other

## 2014-08-20 ENCOUNTER — Encounter (HOSPITAL_COMMUNITY): Payer: Self-pay | Admitting: Emergency Medicine

## 2014-08-20 VITALS — BP 122/70 | HR 144 | Temp 99.1°F | Resp 18 | Ht 70.0 in | Wt 198.0 lb

## 2014-08-20 DIAGNOSIS — R42 Dizziness and giddiness: Secondary | ICD-10-CM | POA: Diagnosis not present

## 2014-08-20 DIAGNOSIS — Z7982 Long term (current) use of aspirin: Secondary | ICD-10-CM | POA: Diagnosis not present

## 2014-08-20 DIAGNOSIS — I129 Hypertensive chronic kidney disease with stage 1 through stage 4 chronic kidney disease, or unspecified chronic kidney disease: Secondary | ICD-10-CM | POA: Diagnosis present

## 2014-08-20 DIAGNOSIS — I5041 Acute combined systolic (congestive) and diastolic (congestive) heart failure: Secondary | ICD-10-CM | POA: Diagnosis present

## 2014-08-20 DIAGNOSIS — N183 Chronic kidney disease, stage 3 (moderate): Secondary | ICD-10-CM | POA: Diagnosis present

## 2014-08-20 DIAGNOSIS — Z87891 Personal history of nicotine dependence: Secondary | ICD-10-CM | POA: Diagnosis not present

## 2014-08-20 DIAGNOSIS — Z6830 Body mass index (BMI) 30.0-30.9, adult: Secondary | ICD-10-CM | POA: Diagnosis not present

## 2014-08-20 DIAGNOSIS — I6521 Occlusion and stenosis of right carotid artery: Secondary | ICD-10-CM | POA: Diagnosis present

## 2014-08-20 DIAGNOSIS — I4892 Unspecified atrial flutter: Secondary | ICD-10-CM | POA: Diagnosis present

## 2014-08-20 DIAGNOSIS — I10 Essential (primary) hypertension: Secondary | ICD-10-CM | POA: Diagnosis present

## 2014-08-20 DIAGNOSIS — E669 Obesity, unspecified: Secondary | ICD-10-CM | POA: Diagnosis present

## 2014-08-20 DIAGNOSIS — E785 Hyperlipidemia, unspecified: Secondary | ICD-10-CM | POA: Diagnosis present

## 2014-08-20 DIAGNOSIS — I359 Nonrheumatic aortic valve disorder, unspecified: Secondary | ICD-10-CM | POA: Diagnosis not present

## 2014-08-20 DIAGNOSIS — R000 Tachycardia, unspecified: Secondary | ICD-10-CM | POA: Diagnosis not present

## 2014-08-20 DIAGNOSIS — D72829 Elevated white blood cell count, unspecified: Secondary | ICD-10-CM | POA: Diagnosis present

## 2014-08-20 DIAGNOSIS — I4891 Unspecified atrial fibrillation: Secondary | ICD-10-CM | POA: Diagnosis present

## 2014-08-20 DIAGNOSIS — I350 Nonrheumatic aortic (valve) stenosis: Secondary | ICD-10-CM | POA: Diagnosis not present

## 2014-08-20 DIAGNOSIS — R079 Chest pain, unspecified: Secondary | ICD-10-CM | POA: Diagnosis not present

## 2014-08-20 DIAGNOSIS — R531 Weakness: Secondary | ICD-10-CM | POA: Diagnosis not present

## 2014-08-20 DIAGNOSIS — R072 Precordial pain: Secondary | ICD-10-CM | POA: Diagnosis not present

## 2014-08-20 DIAGNOSIS — I480 Paroxysmal atrial fibrillation: Secondary | ICD-10-CM

## 2014-08-20 DIAGNOSIS — R0602 Shortness of breath: Secondary | ICD-10-CM

## 2014-08-20 DIAGNOSIS — Z8546 Personal history of malignant neoplasm of prostate: Secondary | ICD-10-CM

## 2014-08-20 DIAGNOSIS — M79609 Pain in unspecified limb: Secondary | ICD-10-CM | POA: Diagnosis not present

## 2014-08-20 DIAGNOSIS — R0789 Other chest pain: Secondary | ICD-10-CM | POA: Diagnosis not present

## 2014-08-20 DIAGNOSIS — I739 Peripheral vascular disease, unspecified: Secondary | ICD-10-CM

## 2014-08-20 DIAGNOSIS — I5189 Other ill-defined heart diseases: Secondary | ICD-10-CM | POA: Diagnosis present

## 2014-08-20 DIAGNOSIS — N184 Chronic kidney disease, stage 4 (severe): Secondary | ICD-10-CM

## 2014-08-20 DIAGNOSIS — D649 Anemia, unspecified: Secondary | ICD-10-CM | POA: Diagnosis present

## 2014-08-20 DIAGNOSIS — I779 Disorder of arteries and arterioles, unspecified: Secondary | ICD-10-CM | POA: Diagnosis present

## 2014-08-20 DIAGNOSIS — R609 Edema, unspecified: Secondary | ICD-10-CM | POA: Diagnosis not present

## 2014-08-20 DIAGNOSIS — I771 Stricture of artery: Secondary | ICD-10-CM | POA: Diagnosis not present

## 2014-08-20 DIAGNOSIS — I5032 Chronic diastolic (congestive) heart failure: Secondary | ICD-10-CM

## 2014-08-20 HISTORY — DX: Paroxysmal atrial fibrillation: I48.0

## 2014-08-20 LAB — I-STAT TROPONIN, ED: TROPONIN I, POC: 0 ng/mL (ref 0.00–0.08)

## 2014-08-20 LAB — URINALYSIS, ROUTINE W REFLEX MICROSCOPIC
Bilirubin Urine: NEGATIVE
GLUCOSE, UA: NEGATIVE mg/dL
Hgb urine dipstick: NEGATIVE
KETONES UR: NEGATIVE mg/dL
Leukocytes, UA: NEGATIVE
Nitrite: NEGATIVE
PH: 5 (ref 5.0–8.0)
Protein, ur: NEGATIVE mg/dL
Specific Gravity, Urine: 1.016 (ref 1.005–1.030)
Urobilinogen, UA: 0.2 mg/dL (ref 0.0–1.0)

## 2014-08-20 LAB — BASIC METABOLIC PANEL
Anion gap: 6 (ref 5–15)
BUN: 40 mg/dL — ABNORMAL HIGH (ref 6–23)
CO2: 23 mmol/L (ref 19–32)
Calcium: 8.5 mg/dL (ref 8.4–10.5)
Chloride: 107 mEq/L (ref 96–112)
Creatinine, Ser: 1.91 mg/dL — ABNORMAL HIGH (ref 0.50–1.35)
GFR calc Af Amer: 36 mL/min — ABNORMAL LOW (ref >90)
GFR calc non Af Amer: 31 mL/min — ABNORMAL LOW (ref >90)
GLUCOSE: 105 mg/dL — AB (ref 70–99)
Potassium: 4.5 mmol/L (ref 3.5–5.1)
Sodium: 136 mmol/L (ref 135–145)

## 2014-08-20 LAB — CBC
HEMATOCRIT: 33.1 % — AB (ref 39.0–52.0)
Hemoglobin: 10.9 g/dL — ABNORMAL LOW (ref 13.0–17.0)
MCH: 31.6 pg (ref 26.0–34.0)
MCHC: 32.9 g/dL (ref 30.0–36.0)
MCV: 95.9 fL (ref 78.0–100.0)
PLATELETS: 391 10*3/uL (ref 150–400)
RBC: 3.45 MIL/uL — ABNORMAL LOW (ref 4.22–5.81)
RDW: 12.4 % (ref 11.5–15.5)
WBC: 19.5 10*3/uL — ABNORMAL HIGH (ref 4.0–10.5)

## 2014-08-20 LAB — TSH: TSH: 1.64 u[IU]/mL (ref 0.350–4.500)

## 2014-08-20 LAB — TROPONIN I: Troponin I: <0.03 ng/mL (ref <0.031)

## 2014-08-20 LAB — BRAIN NATRIURETIC PEPTIDE: B Natriuretic Peptide: 416.9 pg/mL — ABNORMAL HIGH (ref 0.0–100.0)

## 2014-08-20 MED ORDER — DILTIAZEM LOAD VIA INFUSION
10.0000 mg | Freq: Once | INTRAVENOUS | Status: AC
  Administered 2014-08-20: 10 mg via INTRAVENOUS
  Filled 2014-08-20: qty 10

## 2014-08-20 MED ORDER — ONDANSETRON HCL 4 MG PO TABS: 4.0000 mg | ORAL_TABLET | Freq: Four times a day (QID) | ORAL | Status: DC | PRN

## 2014-08-20 MED ORDER — SODIUM CHLORIDE 0.9 % IJ SOLN
3.0000 mL | Freq: Two times a day (BID) | INTRAMUSCULAR | Status: DC
  Administered 2014-08-21 – 2014-08-24 (×3): 3 mL via INTRAVENOUS

## 2014-08-20 MED ORDER — DILTIAZEM HCL 25 MG/5ML IV SOLN
10.0000 mg | Freq: Once | INTRAVENOUS | Status: AC
  Administered 2014-08-20: 10 mg via INTRAVENOUS
  Filled 2014-08-20: qty 5

## 2014-08-20 MED ORDER — ACETAMINOPHEN 325 MG PO TABS: 650.0000 mg | ORAL_TABLET | Freq: Four times a day (QID) | ORAL | Status: DC | PRN

## 2014-08-20 MED ORDER — ACETAMINOPHEN 650 MG RE SUPP: 650.0000 mg | Freq: Four times a day (QID) | RECTAL | Status: DC | PRN

## 2014-08-20 MED ORDER — SODIUM CHLORIDE 0.9 % IV SOLN
INTRAVENOUS | Status: DC
  Administered 2014-08-20 – 2014-08-22 (×3): via INTRAVENOUS

## 2014-08-20 MED ORDER — HEPARIN BOLUS VIA INFUSION
4000.0000 [IU] | Freq: Once | INTRAVENOUS | Status: AC
  Administered 2014-08-20: 4000 [IU] via INTRAVENOUS
  Filled 2014-08-20: qty 4000

## 2014-08-20 MED ORDER — ONDANSETRON HCL 4 MG/2ML IJ SOLN: 4.0000 mg | Freq: Four times a day (QID) | INTRAMUSCULAR | Status: DC | PRN

## 2014-08-20 MED ORDER — ROSUVASTATIN CALCIUM 10 MG PO TABS
5.0000 mg | ORAL_TABLET | Freq: Every day | ORAL | Status: DC
  Administered 2014-08-21 – 2014-08-22 (×2): 5 mg via ORAL
  Filled 2014-08-20 (×2): qty 0.5

## 2014-08-20 MED ORDER — FERROUS SULFATE 325 (65 FE) MG PO TABS
325.0000 mg | ORAL_TABLET | Freq: Every day | ORAL | Status: DC
  Administered 2014-08-21 – 2014-08-24 (×4): 325 mg via ORAL
  Filled 2014-08-20 (×4): qty 1

## 2014-08-20 MED ORDER — TOPIRAMATE 100 MG PO TABS
100.0000 mg | ORAL_TABLET | Freq: Every day | ORAL | Status: DC
  Administered 2014-08-20 – 2014-08-23 (×4): 100 mg via ORAL
  Filled 2014-08-20 (×5): qty 1

## 2014-08-20 MED ORDER — DILTIAZEM HCL 100 MG IV SOLR
10.0000 mg/h | INTRAVENOUS | Status: DC
  Administered 2014-08-20: 5 mg/h via INTRAVENOUS
  Administered 2014-08-20: 15 mg/h via INTRAVENOUS
  Administered 2014-08-21: 10 mg/h via INTRAVENOUS
  Administered 2014-08-21: 15 mg/h via INTRAVENOUS
  Administered 2014-08-21 – 2014-08-22 (×2): 10 mg/h via INTRAVENOUS

## 2014-08-20 MED ORDER — HEPARIN (PORCINE) IN NACL 100-0.45 UNIT/ML-% IJ SOLN
1450.0000 [IU]/h | INTRAMUSCULAR | Status: DC
  Administered 2014-08-20 – 2014-08-21 (×2): 1200 [IU]/h via INTRAVENOUS
  Administered 2014-08-22: 1350 [IU]/h via INTRAVENOUS
  Administered 2014-08-23: 1450 [IU]/h via INTRAVENOUS
  Filled 2014-08-20 (×6): qty 250

## 2014-08-20 MED ORDER — HYDROCODONE-HOMATROPINE 5-1.5 MG/5ML PO SYRP: 5.0000 mL | ORAL_SOLUTION | Freq: Three times a day (TID) | ORAL | Status: DC | PRN

## 2014-08-20 NOTE — ED Notes (Signed)
Pt arrives EMS from [facility] as transfer. Pt with intermittent CP, dizziness, SOB since Dec 28th. Pt awake, alert, new onset a flutter. Lungs CTA. 22g left FA, 324mg  ASA PTA.

## 2014-08-20 NOTE — Consult Note (Signed)
Patient ID: Mark Jackson. MRN: [id number], DOB/AGE: [birth year]   Admit date: 08/20/2014   Primary Physician: Jenny Reichmann, MD Primary Cardiologist: Dr. Richardo Priest. Gwenlyn Found  Pt. Profile:  81 year old male with history of hypertension, hyperlipidemia, carotid artery stenosis, stage III chronic kidney disease, chronic diastolic heart failure and history of syncope present with CP, SOB, dizziness for 2 weeks. Found to be in new a-flutter with RVR by urgent care and transferred to [facility].  Problem List  Past Medical History  Diagnosis Date  . Hypertension   . Prostate ca   . Left ventricular diastolic dysfunction, NYHA class 08 October 2012    Echocardiogram March 2014: EF 55-60%. Moderate concentric LVH. Grade 1 diastolic dysfunction. Very mild aortic stenosis/sclerosis.   . Anemia   . Carotid artery disease     Right carotid 60-80% stenosis; stable from 2013-2014  . Hyperlipidemia   . Chronic kidney disease (CKD), stage III (moderate) B   . Obesity (BMI 30-39.9) 09/03/2013    Past Surgical History  Procedure Laterality Date  . Partial knee arthroplasty Bilateral   . Tonsillectomy and adenoidectomy    . Nm myoview ltd  February 2012    Persantine Myoview: Inferior attenuation artifact but no ischemia or infarction. Normal EF.  . Carotid doppler  10/21/2012    Right bulb 50-69% diameter reduction; Right proximal ICA 70-99% diameter reduction; left ICA 0-49% diameter reduction.  . Cardiovascular stress test  10/03/2010    Perfusion defect in the inferior myocardial region consistent with diaphragmatic attenuation. Remaining myocardium demonstrates normal myocardial perfusion with no evidence of ischemia or infarct. No ECG changes. EKG negative for ischemia.  . Transthoracic echocardiogram  10/21/2012    EF 55-60%. Moderate concentric hypertrophy, grade 1 diastolic dysfunction     Allergies  Allergies  Allergen Reactions  . Plavix [Clopidogrel Bisulfate]     Significant bruising     HPI  The patient is a 81 year old male with history of HTN, HLD, carotid artery stenosis, stage III CKD, chronic diastolic heart failure and history of syncope. He began to follow-up with Dr. Ellyn Hack several years ago after presenting with recurrent syncope. Since then, he also follow-up with Dr. Gwenlyn Found with his moderate right internal carotid artery stenosis. Patient has no prior history of arrhythmia. His last Myoview was February 2012 which showed preserved EF, inferior perfusion abnormality consistent with diaphragmatic attenuation, no sign of ischemia/scar, overall low risk study. According to the patient, he has not had any recurrent syncope since 4 years ago, although there was a few times where he had presyncope symptom. The last time he saw Dr. Gwenlyn Found was in August 2015, duplex ultrasound shows moderate right ICA stenosis unchanged, he was scheduled 1 year follow-up.  Patient has been having chest pain radiating to the neck, back, left shoulder, mild shortness of breath, presyncope and cough since the end of December. He states the chest pain never really went away, although his neck, back and the left shoulder is no longer hurting. He admits to some chronic lower extremity swelling and has been taking HCTZ for it. His symptom usually occuring at rest and worsen when stand up. He denies any recent fever or chill. He is scheduled to see his PCP, however his symptoms got worse prompting him to seek medical attention at local urgent care. While to urgent care, EKG showed patient has been in 2:1 atrial flutter with RVR. He was subsequently transferred to [facility] for further evaluation. On arrival his heart  rate was 124, temperature 99.4, blood pressure 114/91. O2 saturation 99% on room air. Significant laboratory finding include creatinine 1.91, BNP 416, white blood cell count 19.5. Hemoglobin 10.9. Chest x-ray was negative for acute process. EKG showed 2-1 atrial flutter, questionable  inferior infarct. Cardiology has been consulted for atrial flutter.    Home Medications  Prior to Admission medications   Medication Sig Start Date End Date Taking? Authorizing Provider  acetaminophen (TYLENOL) 325 MG tablet Take 325-650 mg by mouth every 6 (six) hours as needed for headache.    Yes Historical Provider, MD  aspirin 81 MG tablet Take 81 mg by mouth 2 (two) times daily.    Yes Historical Provider, MD  CRESTOR 5 MG tablet TAKE 1 TABLET ONCE DAILY. 03/23/14  Yes Lorretta Harp, MD  Cyanocobalamin (VITAMIN B-12 IJ) Inject 1 Units as directed every 30 (thirty) days.   Yes Historical Provider, MD  ferrous sulfate 325 (65 FE) MG tablet Take 325 mg by mouth daily with breakfast.   Yes Historical Provider, MD  hydrochlorothiazide (MICROZIDE) 12.5 MG capsule Take 1 capsule (12.5 mg total) by mouth daily. 05/15/14  Yes [name], MD  HYDROcodone-homatropine (HYCODAN) 5-1.5 MG/5ML syrup Take 5 mLs by mouth every 8 (eight) hours as needed for cough. 07/25/13  Yes [name], MD  ibuprofen (ADVIL,MOTRIN) 200 MG tablet Take 200-400 mg by mouth 2 (two) times daily as needed (pain).   Yes Historical Provider, MD  LOPERAMIDE HCL PO Take 2-3 tablets by mouth every 6 (six) hours as needed (diarrhea).   Yes Historical Provider, MD  losartan (COZAAR) 50 MG tablet TAKE 1 TABLET EACH DAY. 06/27/14  Yes Chelle S Jeffery, PA-C  Multiple Vitamins-Minerals (MULTIVITAMIN WITH MINERALS) tablet Take 1 tablet by mouth daily.   Yes Historical Provider, MD  Oxymetazoline HCl (SUDAFED OM SINUS COLD NA) Place 1 tablet into the nose 2 (two) times daily as needed (congestion).   Yes Historical Provider, MD  Tetrahydrozoline HCl (VISINE OP) Apply 1 drop to eye 2 (two) times daily as needed (burning of eyes).   Yes Historical Provider, MD  topiramate (TOPAMAX) 100 MG tablet Take 100 mg by mouth at bedtime.   Yes Historical Provider, MD  amoxicillin (AMOXIL) 875 MG tablet Take 1 tablet (875 mg total) by mouth 2  (two) times daily. Patient not taking: Reported on 08/20/2014 10/09/13   Robyn Haber, MD  baclofen (LIORESAL) 10 MG tablet Take 10 mg by mouth daily as needed (for headache).     Historical Provider, MD  topiramate (TOPAMAX) 25 MG tablet Take 25 mg by mouth 2 (two) times daily.    Historical Provider, MD    Family History  Family History  Problem Relation Age of Onset  . Cancer Mother     ovarin cancer    Social History  History   Social History  . Marital Status: Married    Spouse Name: N/A    Number of Children: N/A  . Years of Education: N/A   Occupational History  . Not on file.   Social History Main Topics  . Smoking status: Former Smoker    Types: Pipe, Landscape architect  . Smokeless tobacco: Never Used  . Alcohol Use: No  . Drug Use: No  . Sexual Activity: Not on file   Other Topics Concern  . 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   He used to smoke a pipe for several years but quit about 30 some years ago. He does not drink alcohol.           Review of Systems General:  No chills, fever, night sweats or weight changes.  Cardiovascular:  No orthopnea, palpitations, paroxysmal nocturnal dyspnea. + chest pain, dyspnea on exertion, edema Dermatological: No rash, lesions/masses Respiratory: +cough, dyspnea Urologic: No hematuria, dysuria Abdominal:   No nausea, vomiting, diarrhea, bright red blood per rectum, melena, or hematemesis Neurologic:  No visual changes, wkns, changes in mental status. All other systems reviewed and are otherwise negative except as noted above.  Physical Exam  Blood pressure 116/72, pulse 104, temperature 99 F (37.2 C), temperature source Oral, resp. rate 14, height 5\' 8"  (1.727 m), weight 200 lb (90.719 kg), SpO2 97 %.   General: Pleasant, NAD Psych: Normal affect. Neuro: Alert and oriented X 3. Moves all extremities spontaneously. HEENT: Normal  Neck: Supple without bruits or JVD. Lungs:  Resp regular and unlabored, CTA. Heart: irregularly, tachycardic no s3, s4, or murmurs. Abdomen: Soft, non-tender, non-distended, BS + x 4.  Extremities: No clubbing, cyanosis. DP/PT/Radials 2+ and equal bilaterally. 1+ pitting edema in bilateral LE  Labs  Troponin (Point of Care Test)  Recent Labs  08/20/14 1151  TROPIPOC 0.00   No results for input(s): CKTOTAL, CKMB, TROPONINI in the last 72 hours. Lab Results  Component Value Date   WBC 19.5* 08/20/2014   HGB 10.9* 08/20/2014   HCT 33.1* 08/20/2014   MCV 95.9 08/20/2014   PLT 391 08/20/2014    Recent Labs Lab 08/20/14 1138  NA 136  K 4.5  CL 107  CO2 23  BUN 40*  CREATININE 1.91*  CALCIUM 8.5  GLUCOSE 105*   Lab Results  Component Value Date   CHOL 114 08/15/2013   HDL 37* 08/15/2013   LDLCALC 64 08/15/2013   TRIG 63 08/15/2013   No results found for: DDIMER   Radiology/Studies  Dg Chest Port 1 View  08/20/2014   CLINICAL DATA:  Sternal chest pain, shortness of breath, weakness.  EXAM: PORTABLE CHEST - 1 VIEW  COMPARISON:  10/08/2011  FINDINGS: Studies AP lordotic in positioning. Heart and mediastinal contours are within normal limits. No focal opacities or effusions. No acute bony abnormality.  IMPRESSION: No active disease.   Electronically Signed   By: Rolm Baptise M.D.   On: 08/20/2014 12:40    ECG  Aflutter with RVR  Echocardiogram 10/21/2012  - Left ventricle: The cavity size was normal. There was moderate concentric hypertrophy. Systolic function was normal. The estimated ejection fraction was in the range of 55% to 60%. Doppler parameters are consistent with abnormal left ventricular relaxation (grade 1 diastolic dysfunction). The E/e' ratio is >10, suggesting elevated LV filling pressure. - Aortic valve:  Trileaflet; moderately calcified leaflets. Transvalvular velocity was minimally increased with perhaps mild aortic stenosis - the mean gradient is 7 mmHg. - Mitral valve: Calcified annulus. Trivial regurgitation. - Left atrium: LA Volume/BSA 22.9 ml/m2. The atrium was normal in size. - Atrial septum: No defect or patent foramen ovale was identified by saline microcontrast bubble study and color doppler. - Inferior vena cava: The vessel was normal in size; the respirophasic diameter changes were in the normal range (= 50%); findings are consistent with normal central venous pressure. - Pericardium, extracardiac: There was no pericardial effusion.  ASSESSMENT AND PLAN  1. New onset atrial flutter with RVR  - CHA2DS2-Vasc score 4 (age, HTN, vascular disease)  - obtain echo to assess LV function, check TSH. Check orthostatic BP as some of his dizziness worse when stand up.  - start heparin gtt, not a good NOAC candidate with Cr 1.91, will consider coumadin (although pt was previous taken off plavix due to h/o severe ecchymosis)  - start diltiazem gtt, transition to BB once HR stable. If unable to medically manage HR or pt continue to have symptom after rate control, will consider TEE/Cardioversion  2. Chest pain, persistent for the past 2 weeks  - likely related to #1, if has persistent CP or LV dysfunction, possibly need outpatient myoview  - negative trop after having persistent CP for 2 wks  3. Leukocytosis  - unclear cause, U/A negative, no sign of PNA on CXR.   - described coughing up thick grayish phlegm  4. Chronic diastolic heart failure  - fluid status stable, although describe having a cough, no rale on exam. Mild LE pitting edema.   - depend on echo finding, may change HCTZ to lasix  5. Hypertension 6. Hyperlipidemia 7. carotid artery stenosis: followed by Dr. Gwenlyn Found, stable, moderate 8. stage III chronic kidney disease 9. history of  syncope   Signed, Almyra Deforest, PA-C 08/20/2014, 3:38 PM

## 2014-08-20 NOTE — ED Provider Notes (Signed)
Patient care signed over to me by Montine Circle, PA-C. Plan is to have patient be seen by cardiology and dispose accordingly. Patient presented with new onset atrial flutter, has been rate controlled in the 90s since arrival in ED with Cardizem. Cardiology saw the patient and feels like a flutter may be driven by a infection and would like to have this admitted to medicine service.  Pt admitted to Medicine, Dr. Cherlynn Kaiser, PA-C 08/20/14 [place], MD 08/22/14 [phone]

## 2014-08-20 NOTE — ED Notes (Signed)
Attempted to call report and no answer.

## 2014-08-20 NOTE — Progress Notes (Signed)
   Subjective:    Patient ID: Mark Jackson., male    DOB: 06-Jan-1930, 81 y.o.   MRN: [id number]  HPI Has cp, sob, fatigue dizzy heat rate is 147/min He is fuzzy headed and has a rapid pulse. No past hx of MI Stat ekg, start oxygen  Review of Systems     Objective:   Physical Exam  Constitutional: He appears well-developed and well-nourished. He appears lethargic. He appears distressed.  HENT:  Head: Normocephalic.  Eyes: Conjunctivae and EOM are normal. Pupils are equal, round, and reactive to light.  Neck: Normal range of motion. Neck supple.  Cardiovascular: An irregularly irregular rhythm present.  No extrasystoles are present. Tachycardia present.  Exam reveals gallop and distant heart sounds.   Pulmonary/Chest: Effort normal and breath sounds normal. No tachypnea.  Musculoskeletal: He exhibits edema.  Neurological: He has normal strength. He appears lethargic. He is disoriented.  Skin: He is diaphoretic.  Psychiatric: His speech is delayed. He is slowed.    Stat EKG fib-flutter rvr., inferior changes suggestive of acute MI inferior ASA 325mg  po EMTs called      Assessment & Plan:  Fatigue dizzy, near syncope for last one week. EMTs to cardiology/Triage called Acute Flutter/Fib with inferior ischemia

## 2014-08-20 NOTE — ED Provider Notes (Signed)
CSN: [id number]     Arrival date & time 08/20/14  1120 History   First MD Initiated Contact with Patient 08/20/14 1146     Chief Complaint  Patient presents with  . Chest Pain  . Shortness of Breath     (Consider location/radiation/quality/duration/timing/severity/associated sxs/prior Treatment) HPI Comments: Patient presents to the emergency department with chief complaint of chest pain, dizziness, and shortness of breath which has been intermittent for the past 2 weeks. States symptoms recently worsened over the weekend. He was seen at [facility] urgent care today, and transferred to the emergency department with new onset atrial flutter. Patient denies any symptoms at rest. 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 He has not taken anything to alleviate his symptoms. He was given aspirin prior to arrival.  Primary care is Dr. Everlene Farrier, with [place].  The history is provided by the patient. No language interpreter was used.    Past Medical History  Diagnosis Date  . Hypertension   . Prostate ca   . Left ventricular diastolic dysfunction, NYHA class 08 October 2012    Echocardiogram March 2014: EF 55-60%. Moderate concentric LVH. Grade 1 diastolic dysfunction. Very mild aortic stenosis/sclerosis.   . Anemia   . Carotid artery disease     Right carotid 60-80% stenosis; stable from 2013-2014  . Hyperlipidemia   . Chronic kidney disease (CKD), stage III (moderate) B   . Obesity (BMI 30-39.9) 09/03/2013   Past Surgical History  Procedure Laterality Date  . Partial knee arthroplasty Bilateral   . Tonsillectomy and adenoidectomy    . Nm myoview ltd  February 2012    Persantine Myoview: Inferior attenuation artifact but no ischemia or infarction. Normal EF.  . Carotid doppler  10/21/2012    Right bulb 50-69% diameter reduction; Right proximal ICA 70-99% diameter reduction; left ICA 0-49% diameter reduction.  . Cardiovascular stress test   10/03/2010    Perfusion defect in the inferior myocardial region consistent with diaphragmatic attenuation. Remaining myocardium demonstrates normal myocardial perfusion with no evidence of ischemia or infarct. No ECG changes. EKG negative for ischemia.  . Transthoracic echocardiogram  10/21/2012    EF 55-60%. Moderate concentric hypertrophy, grade 1 diastolic dysfunction   Family History  Problem Relation Age of Onset  . Cancer Mother     ovarin cancer   History  Substance Use Topics  . Smoking status: Former Smoker    Types: Pipe, Landscape architect  . Smokeless tobacco: Never Used  . Alcohol Use: No    Review of Systems  Constitutional: Negative for fever and chills.  Respiratory: Negative for shortness of breath.   Cardiovascular: Negative for chest pain.  Gastrointestinal: Negative for nausea, vomiting, diarrhea and constipation.  Genitourinary: Negative for dysuria.  All other systems reviewed and are negative.     Allergies  Plavix  Home Medications   Prior to Admission medications   Medication Sig Start Date End Date Taking? Authorizing Provider  acetaminophen (TYLENOL) 325 MG tablet Take 650 mg by mouth every 6 (six) hours as needed.    Historical Provider, MD  amoxicillin (AMOXIL) 875 MG tablet Take 1 tablet (875 mg total) by mouth 2 (two) times daily. Patient not taking: Reported on 08/20/2014 10/09/13   Robyn Haber, MD  aspirin 81 MG tablet Take 81 mg by mouth 2 (two) times daily.     Historical Provider, MD  baclofen (LIORESAL) 10 MG tablet Take 10 mg by mouth as needed.  Historical Provider, MD  CRESTOR 5 MG tablet TAKE 1 TABLET ONCE DAILY. 03/23/14   Lorretta Harp, MD  ferrous fumarate (HEMOCYTE - 106 MG FE) 325 (106 FE) MG TABS Take 1 tablet by mouth.    Historical Provider, MD  hydrochlorothiazide (MICROZIDE) 12.5 MG capsule Take 1 capsule (12.5 mg total) by mouth daily. 05/15/14   [name], MD  HYDROcodone-homatropine [facility]) 5-1.5 MG/5ML syrup Take 5 mLs by  mouth every 8 (eight) hours as needed for cough. 07/25/13   [name], MD  Loperamide HCl (ANTI-DIARRHEAL PO) Take by mouth.    Historical Provider, MD  losartan (COZAAR) 50 MG tablet TAKE 1 TABLET EACH DAY. 06/27/14   Chelle Janalee Dane, PA-C  Multiple Vitamins-Minerals (MULTIVITAMIN WITH MINERALS) tablet Take 1 tablet by mouth daily.    Historical Provider, MD  topiramate (TOPAMAX) 25 MG tablet Take 25 mg by mouth 2 (two) times daily.    Historical Provider, MD   BP 114/91 mmHg  Pulse 124  Temp(Src) 99.4 F (37.4 C) (Oral)  Resp 13  Ht 5\' 8"  (1.727 m)  Wt 200 lb (90.719 kg)  BMI 30.42 kg/m2  SpO2 99% Physical Exam  Constitutional: He is oriented to person, place, and time. He appears well-developed and well-nourished.  HENT:  Head: Normocephalic and atraumatic.  Eyes: Conjunctivae and EOM are normal. Pupils are equal, round, and reactive to light. Right eye exhibits no discharge. Left eye exhibits no discharge. No scleral icterus.  Neck: Normal range of motion. Neck supple. No JVD present.  Cardiovascular: Normal heart sounds.  Exam reveals no gallop and no friction rub.   No murmur heard. tachycardic  Pulmonary/Chest: Effort normal and breath sounds normal. No respiratory distress. He has no wheezes. He has no rales. He exhibits no tenderness.  Abdominal: Soft. He exhibits no distension and no mass. There is no tenderness. There is no rebound and no guarding.  Musculoskeletal: Normal range of motion. He exhibits no edema or tenderness.  Neurological: He is alert and oriented to person, place, and time.  Skin: Skin is warm and dry.  Psychiatric: He has a normal mood and affect. His behavior is normal. Judgment and thought content normal.  Nursing note and vitals reviewed.   ED Course  Procedures (including critical care time) Results for orders placed or performed during the hospital encounter of 08/20/14  CBC  Result Value Ref Range   WBC 19.5 (H) 4.0 - 10.5 K/uL   RBC  3.45 (L) 4.22 - 5.81 MIL/uL   Hemoglobin 10.9 (L) 13.0 - 17.0 g/dL   HCT 33.1 (L) 39.0 - 52.0 %   MCV 95.9 78.0 - 100.0 fL   MCH 31.6 26.0 - 34.0 pg   MCHC 32.9 30.0 - 36.0 g/dL   RDW 12.4 11.5 - 15.5 %   Platelets 391 150 - 400 K/uL  Basic metabolic panel  Result Value Ref Range   Sodium 136 135 - 145 mmol/L   Potassium 4.5 3.5 - 5.1 mmol/L   Chloride 107 96 - 112 mEq/L   CO2 23 19 - 32 mmol/L   Glucose, Bld 105 (H) 70 - 99 mg/dL   BUN 40 (H) 6 - 23 mg/dL   Creatinine, Ser 1.91 (H) 0.50 - 1.35 mg/dL   Calcium 8.5 8.4 - 10.5 mg/dL   GFR calc non Af Amer 31 (L) >90 mL/min   GFR calc Af Amer 36 (L) >90 mL/min   Anion gap 6 5 - 15  BNP (order  ONLY if patient complains of dyspnea/SOB AND you have documented it for THIS visit)  Result Value Ref Range   B Natriuretic Peptide 416.9 (H) 0.0 - 100.0 pg/mL  Urinalysis, Routine w reflex microscopic  Result Value Ref Range   Color, Urine YELLOW YELLOW   APPearance CLEAR CLEAR   Specific Gravity, Urine 1.016 1.005 - 1.030   pH 5.0 5.0 - 8.0   Glucose, UA NEGATIVE NEGATIVE mg/dL   Hgb urine dipstick NEGATIVE NEGATIVE   Bilirubin Urine NEGATIVE NEGATIVE   Ketones, ur NEGATIVE NEGATIVE mg/dL   Protein, ur NEGATIVE NEGATIVE mg/dL   Urobilinogen, UA 0.2 0.0 - 1.0 mg/dL   Nitrite NEGATIVE NEGATIVE   Leukocytes, UA NEGATIVE NEGATIVE  I-stat troponin, ED (not at [facility])  Result Value Ref Range   Troponin i, poc 0.00 0.00 - 0.08 ng/mL   Comment 3           Dg Chest Port 1 View  08/20/2014   CLINICAL DATA:  Sternal chest pain, shortness of breath, weakness.  EXAM: PORTABLE CHEST - 1 VIEW  COMPARISON:  10/08/2011  FINDINGS: Studies AP lordotic in positioning. Heart and mediastinal contours are within normal limits. No focal opacities or effusions. No acute bony abnormality.  IMPRESSION: No active disease.   Electronically Signed   By: Rolm Baptise M.D.   On: 08/20/2014 12:40     Imaging Review No results found.   EKG  Interpretation   Date/Time:  Monday August 20 2014 11:25:22 EST Ventricular Rate:  125 PR Interval:    QRS Duration: 88 QT Interval:  356 QTC Calculation: 513 R Axis:   -51 Text Interpretation:  Atrial flutter with 2:1 AV block Inferior infarct,  old Lateral leads are also involved Prolonged QT interval Confirmed by  [place] [phone]) on 08/20/2014 12:04:35 PM      MDM   Final diagnoses:  None    Patient presents with intermittent chest pain, dizziness, shortness of breath. Symptoms have been intermittent for the past week and a half or so. Recently worsened on Friday.  EKG reveals new atrial flutter. Will treat with Cardizem, heart rate is 120s to 140s. Will give 10 mg bolus because of some blood pressure.  Patient is now rate controlled in the 90s.  Patient seen by and discussed with Dr. Tawnya Crook, recommends cardiology consult.  Discussed the patient with [facility]master.  Cardiology will see the patient.  Disposition per Cardiology recommendations.  Patient signed out to Dr. Doy Mince and Comer Locket, PA-C.    Montine Circle, PA-C 08/20/14 1557  Ernestina Patches, MD 08/21/14 [phone]

## 2014-08-20 NOTE — ED Notes (Signed)
Attempted to call report, no answer

## 2014-08-20 NOTE — H&P (Signed)
History and Physical  Mark Jackson. [id number] DOB: 21-Sep-1929 DOA: 08/20/2014   PCP: Jenny Reichmann, MD   Chief Complaint: CP, SOB  HPI:  81 year old male with a history of hypertension, hyperlipidemia, prostate cancer, right carotid stenosis, CKD stage III, diastolic CHF presented with two-week history of persistent chest discomfort with associated shortness of breath. The patient states that he was on a trip to [place] on 08/06/2014 when he began experiencing chest discomfort that would radiate to his back as well as to his neck. Since that period of time he has been "feeling bad".  He denies any fevers, chills, nausea, vomiting, diarrhea. But he continued to have persistent chest discomfort with fatigue and dyspnea on exertion. On the day of admission, the patient got even more fatigued and had a near syncopal episode. As a result, the patient went to urgent care where an EKG found the patient to have atrial flutter. He was sent to the emergency department for further evaluation. The patient complains of cough but denies any hemoptysis. He denies any abdominal pain, dysuria, hematuria, rashes, synovitis, hematochezia, melena.  Notably, the patient states that he had an "epidural injection" one week ago for back pain.  In the emergency department, the patient had an EKG that showed atrial flutter. The patient was seen by cardiology. He was started on a diltiazem drip and heparin drip. Blood work revealed serum creatinine 1.91, WBC 19.5. Urinalysis was negative for any pyuria. BNP was 416. Point-of-care troponin was unremarkable.  Assessment/Plan: Atrial flutter with RVR -appreciate cardiology consult -Echocardiogram -TSH -Continue diltiazem and heparin drip CKD stage III -Baseline creatinine 1.5-1.8 -Gentle hydration -Discontinue losartan and HCTZ Leukocytosis -It is unclear exactly what was injected during the patient's epidural injection proximal 6 days ago--certainly if  this was steroids, and may contribute to the patient's leukocytosis -Urinalysis negative for pyuria -Chest x-ray negative infiltrates -Blood cultures 2 sets -Add differential to CBC -will not start abx as pt is afebrile and hemodynamically stable -may be stress demargination Chest pain -Cycle troponins Lower extremity pain and edema -Venous duplex to rule out DVT -If it is positive for DVT, the patient may need CT angiogram of the chest to rule out PE as possible etiology for the patient's atrial flutter Hyperlipidemia -continue statin -check lipid panel       Past Medical History  Diagnosis Date  . Hypertension   . Prostate ca   . Left ventricular diastolic dysfunction, NYHA class 08 October 2012    Echocardiogram March 2014: EF 55-60%. Moderate concentric LVH. Grade 1 diastolic dysfunction. Very mild aortic stenosis/sclerosis.   . Anemia   . Carotid artery disease     Right carotid 60-80% stenosis; stable from 2013-2014  . Hyperlipidemia   . Chronic kidney disease (CKD), stage III (moderate) B   . Obesity (BMI 30-39.9) 09/03/2013   Past Surgical History  Procedure Laterality Date  . Partial knee arthroplasty Bilateral   . Tonsillectomy and adenoidectomy    . Nm myoview ltd  February 2012    Persantine Myoview: Inferior attenuation artifact but no ischemia or infarction. Normal EF.  . Carotid doppler  10/21/2012    Right bulb 50-69% diameter reduction; Right proximal ICA 70-99% diameter reduction; left ICA 0-49% diameter reduction.  . Cardiovascular stress test  10/03/2010    Perfusion defect in the inferior myocardial region consistent with diaphragmatic attenuation. Remaining myocardium demonstrates normal myocardial perfusion with no evidence of ischemia or infarct. No ECG changes. EKG  negative for ischemia.  . Transthoracic echocardiogram  10/21/2012    EF 55-60%. Moderate concentric hypertrophy, grade 1 diastolic dysfunction   Social History:  reports that he has quit  smoking. His smoking use included Pipe and Cigars. He has never used smokeless tobacco. He reports that he does not drink alcohol or use illicit drugs.   Family History  Problem Relation Age of Onset  . Cancer Mother     ovarin cancer     Allergies  Allergen Reactions  . Plavix [Clopidogrel Bisulfate]     Significant bruising      Prior to Admission medications   Medication Sig Start Date End Date Taking? Authorizing Provider  acetaminophen (TYLENOL) 325 MG tablet Take 325-650 mg by mouth every 6 (six) hours as needed for headache.    Yes Historical Provider, MD  aspirin 81 MG tablet Take 81 mg by mouth 2 (two) times daily.    Yes Historical Provider, MD  CRESTOR 5 MG tablet TAKE 1 TABLET ONCE DAILY. 03/23/14  Yes Lorretta Harp, MD  Cyanocobalamin (VITAMIN B-12 IJ) Inject 1 Units as directed every 30 (thirty) days.   Yes Historical Provider, MD  ferrous sulfate 325 (65 FE) MG tablet Take 325 mg by mouth daily with breakfast.   Yes Historical Provider, MD  hydrochlorothiazide (MICROZIDE) 12.5 MG capsule Take 1 capsule (12.5 mg total) by mouth daily. 05/15/14  Yes [name], MD  HYDROcodone-homatropine (HYCODAN) 5-1.5 MG/5ML syrup Take 5 mLs by mouth every 8 (eight) hours as needed for cough. 07/25/13  Yes [name], MD  ibuprofen (ADVIL,MOTRIN) 200 MG tablet Take 200-400 mg by mouth 2 (two) times daily as needed (pain).   Yes Historical Provider, MD  LOPERAMIDE HCL PO Take 2-3 tablets by mouth every 6 (six) hours as needed (diarrhea).   Yes Historical Provider, MD  losartan (COZAAR) 50 MG tablet TAKE 1 TABLET EACH DAY. 06/27/14  Yes Chelle S Jeffery, PA-C  Multiple Vitamins-Minerals (MULTIVITAMIN WITH MINERALS) tablet Take 1 tablet by mouth daily.   Yes Historical Provider, MD  Oxymetazoline HCl (SUDAFED OM SINUS COLD NA) Place 1 tablet into the nose 2 (two) times daily as needed (congestion).   Yes Historical Provider, MD  Tetrahydrozoline HCl (VISINE OP) Apply 1 drop to eye  2 (two) times daily as needed (burning of eyes).   Yes Historical Provider, MD  topiramate (TOPAMAX) 100 MG tablet Take 100 mg by mouth at bedtime.   Yes Historical Provider, MD  amoxicillin (AMOXIL) 875 MG tablet Take 1 tablet (875 mg total) by mouth 2 (two) times daily. Patient not taking: Reported on 08/20/2014 10/09/13   Robyn Haber, MD  baclofen (LIORESAL) 10 MG tablet Take 10 mg by mouth daily as needed (for headache).     Historical Provider, MD  topiramate (TOPAMAX) 25 MG tablet Take 25 mg by mouth 2 (two) times daily.    Historical Provider, MD    Review of Systems:  Constitutional:  No weight loss, night sweats, Fevers, chills Head&Eyes: No headache.  No vision loss.  No eye pain or scotoma ENT:  No Difficulty swallowing,Tooth/dental problems,Sore throat,   Cardio-vascular:  No  Orthopnea, PND, swelling in lower extremities,  dizziness, palpitations  GI:  No  abdominal pain, nausea, vomiting, diarrhea, loss of appetite, hematochezia, melena, heartburn, indigestion, Resp:   No coughing up of blood .No wheezing.No chest wall deformity  Skin:  no rash or lesions.  GU:  no dysuria, change in color of urine, no urgency  or frequency. No flank pain.  Musculoskeletal:  No joint pain or swelling. No decreased range of motion.  Psych:  No change in mood or affect. No depression or anxiety. Neurologic: No headache, no dysesthesia, no focal weakness, no vision loss. No syncope  Physical Exam: Filed Vitals:   08/20/14 1630 08/20/14 1645 08/20/14 1700 08/20/14 1715  BP: 115/78 116/72 142/87 128/83  Pulse: 88 73 119 104  Temp:      TempSrc:      Resp: 10 16 20 18   Height:      Weight:      SpO2: 98% 97% 100% 97%   General:  A&O x 3, NAD, nontoxic, pleasant/cooperative Head/Eye: No conjunctival hemorrhage, no icterus, [place]/AT, No nystagmus ENT:  No icterus,  No thrush, good dentition, no pharyngeal exudate Neck:  No masses, no lymphadenpathy, no bruits CV:  IRRR, no rub, no  gallop, no S3 Lung:  CTAB, good air movement, no wheeze, no rhonchi Abdomen: soft/NT, +BS, nondistended, no peritoneal signs Ext: No cyanosis, No rashes, No petechiae, No lymphangitis, 2+LE edema Neuro: CNII-XII intact, strength 4/5 in bilateral upper and lower extremities, no dysmetria  Labs on Admission:  Basic Metabolic Panel:  Recent Labs Lab 08/20/14 1138  NA 136  K 4.5  CL 107  CO2 23  GLUCOSE 105*  BUN 40*  CREATININE 1.91*  CALCIUM 8.5   Liver Function Tests: No results for input(s): AST, ALT, ALKPHOS, BILITOT, PROT, ALBUMIN in the last 168 hours. No results for input(s): LIPASE, AMYLASE in the last 168 hours. No results for input(s): AMMONIA in the last 168 hours. CBC:  Recent Labs Lab 08/20/14 1138  WBC 19.5*  HGB 10.9*  HCT 33.1*  MCV 95.9  PLT 391   Cardiac Enzymes: No results for input(s): CKTOTAL, CKMB, CKMBINDEX, TROPONINI in the last 168 hours. BNP: Invalid input(s): POCBNP CBG: No results for input(s): GLUCAP in the last 168 hours.  Radiological Exams on Admission: Dg Chest Port 1 View  08/20/2014   CLINICAL DATA:  Sternal chest pain, shortness of breath, weakness.  EXAM: PORTABLE CHEST - 1 VIEW  COMPARISON:  10/08/2011  FINDINGS: Studies AP lordotic in positioning. Heart and mediastinal contours are within normal limits. No focal opacities or effusions. No acute bony abnormality.  IMPRESSION: No active disease.   Electronically Signed   By: Rolm Baptise M.D.   On: 08/20/2014 12:40    EKG: Independently reviewed. AFlutter with nonsp ST changes    Time spent:70 minutes Code Status:   FULL Family Communication:   Wife and son at bedside   Larrie Fraizer, DO  [facility] Pager [phone]  If 7PM-7AM, please contact night-coverage [email or web address] Password [facility] 08/20/2014, 6:09 PM

## 2014-08-20 NOTE — ED Notes (Addendum)
HR inc. With standing.

## 2014-08-20 NOTE — Progress Notes (Signed)
ANTICOAGULATION CONSULT NOTE - Initial Consult  Pharmacy Consult for Heparin Indication: atrial fibrillation  Allergies  Allergen Reactions  . Plavix [Clopidogrel Bisulfate]     Significant bruising    Patient Measurements: Height: 5\' 8"  (172.7 cm) Weight: 200 lb (90.719 kg) IBW/kg (Calculated) : 68.4 Heparin Dosing Weight: 87 kg  Vital Signs: Temp: 99 F (37.2 C) (01/11 1218) Temp Source: Oral (01/11 1218) BP: 116/72 mmHg (01/11 1645) Pulse Rate: 73 (01/11 1645)  Labs:  Recent Labs  08/20/14 1138  HGB 10.9*  HCT 33.1*  PLT 391  CREATININE 1.91*    Estimated Creatinine Clearance: 32.6 mL/min (by C-G formula based on Cr of 1.91).   Medical History: Past Medical History  Diagnosis Date  . Hypertension   . Prostate ca   . Left ventricular diastolic dysfunction, NYHA class 08 October 2012    Echocardiogram March 2014: EF 55-60%. Moderate concentric LVH. Grade 1 diastolic dysfunction. Very mild aortic stenosis/sclerosis.   . Anemia   . Carotid artery disease     Right carotid 60-80% stenosis; stable from 2013-2014  . Hyperlipidemia   . Chronic kidney disease (CKD), stage III (moderate) B   . Obesity (BMI 30-39.9) 09/03/2013    Medications:   (Not in a hospital admission) Scheduled:   Infusions:  . diltiazem    . diltiazem (CARDIZEM) infusion      Assessment: 81yo male with h/o HTN, HLD, carotid artery stenosis, CKD3, and dCHF presents with CP, SOB and dizziness for 2 weeks and is found to be in Aflutter with RVR. Pharmacy is consulted to dose heparin for Afib. Hgb 10.9, Plt 391, sCr 1.91.  Goal of Therapy:  Heparin level 0.3-0.7 units/ml Monitor platelets by anticoagulation protocol: Yes   Plan:  Give 4000 units bolus x 1 Start heparin infusion at 1200 units/hr Check anti-Xa level in 8 hours and daily while on heparin Continue to monitor H&H and platelets  Monitor s/sx of bleeding F/u on oral anticoagulation  Andrey Cota. Diona Foley, PharmD Clinical  Pharmacist Pager [phone] 08/20/2014,4:49 PM

## 2014-08-20 NOTE — ED Notes (Signed)
Attempted to call report to 3W RN, Network engineer took my number and stated that RN would call me back.

## 2014-08-21 DIAGNOSIS — I4891 Unspecified atrial fibrillation: Principal | ICD-10-CM

## 2014-08-21 DIAGNOSIS — I359 Nonrheumatic aortic valve disorder, unspecified: Secondary | ICD-10-CM

## 2014-08-21 DIAGNOSIS — M79609 Pain in unspecified limb: Secondary | ICD-10-CM

## 2014-08-21 DIAGNOSIS — R609 Edema, unspecified: Secondary | ICD-10-CM

## 2014-08-21 LAB — COMPREHENSIVE METABOLIC PANEL
ALT: 19 U/L (ref 0–53)
AST: 23 U/L (ref 0–37)
Albumin: 2.4 g/dL — ABNORMAL LOW (ref 3.5–5.2)
Alkaline Phosphatase: 50 U/L (ref 39–117)
Anion gap: 8 (ref 5–15)
BUN: 35 mg/dL — ABNORMAL HIGH (ref 6–23)
CO2: 23 mmol/L (ref 19–32)
CREATININE: 1.93 mg/dL — AB (ref 0.50–1.35)
Calcium: 8.2 mg/dL — ABNORMAL LOW (ref 8.4–10.5)
Chloride: 107 mEq/L (ref 96–112)
GFR, EST AFRICAN AMERICAN: 36 mL/min — AB (ref >90)
GFR, EST NON AFRICAN AMERICAN: 31 mL/min — AB (ref >90)
GLUCOSE: 137 mg/dL — AB (ref 70–99)
Potassium: 4 mmol/L (ref 3.5–5.1)
SODIUM: 138 mmol/L (ref 135–145)
Total Bilirubin: 0.7 mg/dL (ref 0.3–1.2)
Total Protein: 6.7 g/dL (ref 6.0–8.3)

## 2014-08-21 LAB — CBC WITH DIFFERENTIAL/PLATELET
BASOS ABS: 0 10*3/uL (ref 0.0–0.1)
Basophils Relative: 0 % (ref 0–1)
Eosinophils Absolute: 0.2 10*3/uL (ref 0.0–0.7)
Eosinophils Relative: 1 % (ref 0–5)
HCT: 32.2 % — ABNORMAL LOW (ref 39.0–52.0)
Hemoglobin: 10.5 g/dL — ABNORMAL LOW (ref 13.0–17.0)
LYMPHS ABS: 1.5 10*3/uL (ref 0.7–4.0)
LYMPHS PCT: 9 % — AB (ref 12–46)
MCH: 31.5 pg (ref 26.0–34.0)
MCHC: 32.6 g/dL (ref 30.0–36.0)
MCV: 96.7 fL (ref 78.0–100.0)
MONOS PCT: 7 % (ref 3–12)
Monocytes Absolute: 1.3 10*3/uL — ABNORMAL HIGH (ref 0.1–1.0)
Neutro Abs: 14.7 10*3/uL — ABNORMAL HIGH (ref 1.7–7.7)
Neutrophils Relative %: 83 % — ABNORMAL HIGH (ref 43–77)
PLATELETS: 404 10*3/uL — AB (ref 150–400)
RBC: 3.33 MIL/uL — AB (ref 4.22–5.81)
RDW: 12.6 % (ref 11.5–15.5)
WBC: 17.7 10*3/uL — AB (ref 4.0–10.5)

## 2014-08-21 LAB — HEPARIN LEVEL (UNFRACTIONATED)
HEPARIN UNFRACTIONATED: 0.37 [IU]/mL (ref 0.30–0.70)
Heparin Unfractionated: 0.3 IU/mL (ref 0.30–0.70)
Heparin Unfractionated: 0.1 IU/mL — ABNORMAL LOW (ref 0.30–0.70)

## 2014-08-21 LAB — LIPID PANEL
Cholesterol: 102 mg/dL (ref 0–200)
HDL: 32 mg/dL — ABNORMAL LOW (ref >39)
LDL CALC: 57 mg/dL (ref 0–99)
Total CHOL/HDL Ratio: 3.2 RATIO
Triglycerides: 65 mg/dL (ref <150)
VLDL: 13 mg/dL (ref 0–40)

## 2014-08-21 LAB — PROTIME-INR
INR: 1.19 (ref 0.00–1.49)
Prothrombin Time: 15.2 seconds (ref 11.6–15.2)

## 2014-08-21 LAB — TROPONIN I
Troponin I: <0.03 ng/mL (ref <0.031)
Troponin I: <0.03 ng/mL (ref <0.031)

## 2014-08-21 MED ORDER — WARFARIN - PHARMACIST DOSING INPATIENT
Freq: Every day | Status: DC
  Administered 2014-08-21 – 2014-08-22 (×2)

## 2014-08-21 MED ORDER — SODIUM CHLORIDE 0.9 % IV SOLN: INTRAVENOUS | Status: DC

## 2014-08-21 MED ORDER — SODIUM CHLORIDE 0.9 % IJ SOLN
3.0000 mL | Freq: Two times a day (BID) | INTRAMUSCULAR | Status: DC
  Administered 2014-08-21 – 2014-08-22 (×4): 3 mL via INTRAVENOUS

## 2014-08-21 MED ORDER — WARFARIN SODIUM 7.5 MG PO TABS
7.5000 mg | ORAL_TABLET | Freq: Once | ORAL | Status: AC
  Administered 2014-08-21: 7.5 mg via ORAL
  Filled 2014-08-21: qty 1

## 2014-08-21 MED ORDER — SODIUM CHLORIDE 0.9 % IJ SOLN
3.0000 mL | INTRAMUSCULAR | Status: DC | PRN
Start: 2014-08-21 — End: 2014-08-24

## 2014-08-21 MED ORDER — SODIUM CHLORIDE 0.9 % IV SOLN: 250.0000 mL | INTRAVENOUS | Status: DC

## 2014-08-21 NOTE — Progress Notes (Addendum)
ANTICOAGULATION CONSULT NOTE - Follow Up Consult  Pharmacy Consult for Heparin Indication: atrial fibrillation  Allergies  Allergen Reactions  . Plavix [Clopidogrel Bisulfate]     Significant bruising    Patient Measurements: Height: 5\' 8"  (172.7 cm) Weight: 194 lb 10.7 oz (88.3 kg) IBW/kg (Calculated) : 68.4 Heparin Dosing Weight:   Vital Signs: Temp: 98.5 F (36.9 C) (01/12 0740) Temp Source: Oral (01/12 0740) BP: 103/53 mmHg (01/12 0740) Pulse Rate: 84 (01/12 0740)  Labs:  Recent Labs  08/20/14 1138 08/20/14 2200 08/21/14 0129 08/21/14 0830 08/21/14 0953  HGB 10.9*  --   --  10.5*  --   HCT 33.1*  --   --  32.2*  --   PLT 391  --   --  404*  --   HEPARINUNFRC  --   --  0.30  --  0.10*  CREATININE 1.91*  --   --  1.93*  --   TROPONINI  --  <0.03 <0.03 <0.03  --     Estimated Creatinine Clearance: 31.9 mL/min (by C-G formula based on Cr of 1.93).   Medications:  Scheduled:  . ferrous sulfate  325 mg Oral Q breakfast  . rosuvastatin  5 mg Oral q1800  . sodium chloride  3 mL Intravenous Q12H  . topiramate  100 mg Oral QHS    Assessment: 81yo male with AFib, on Heparin which is sub-therapeutic this AM at 0.1.  Hg 10.5 and stable, pltc 404.  RN reports some pump problems noted by HS-RN, but none today.  Coumadin is being considered; there is no baseline INR.  No bleeding noted.  Goal of Therapy:  Heparin level 0.3-0.7 units/ml Monitor platelets by anticoagulation protocol: Yes   Plan:  Increase Heparin to 1350 units/hr Heparin level and INR in 8hr Daily HL, CBC Watch for s/s of bleeding.  Gracy Bruins, PharmD Clinical Pharmacist [facility]   08/21/14  Pharmacy- Coumadin 1155  To add Coumadin, no baseline INR available at this time but no reason to suspect would be abnormal.  Goal INR 2-3.  1-  INR now 2-  Coumadin 7.5mg  this PM, adjust dosing if necessary based on baseline result 3-  Daily INR  Gracy Bruins,  PharmD Clinical Pharmacist [facility]

## 2014-08-21 NOTE — Anesthesia Preprocedure Evaluation (Addendum)
Anesthesia Evaluation  Patient identified by MRN, date of birth, ID band Patient awake    Reviewed: Allergy & Precautions, NPO status , Patient's Chart, lab work & pertinent test results, reviewed documented beta blocker date and time   History of Anesthesia Complications Negative for: history of anesthetic complications  Airway        Dental   Pulmonary sleep apnea , former smoker,          Cardiovascular hypertension, Pt. on medications + dysrhythmias Atrial Fibrillation  EF 45-50%   Neuro/Psych negative neurological ROS  negative psych ROS   GI/Hepatic negative GI ROS, Neg liver ROS,   Endo/Other  negative endocrine ROS  Renal/GU Renal InsufficiencyRenal diseaseGFR 30     Musculoskeletal   Abdominal   Peds  Hematology 10/32   Anesthesia Other Findings   Reproductive/Obstetrics                            Anesthesia Physical Anesthesia Plan  ASA: III  Anesthesia Plan: MAC   Post-op Pain Management:    Induction: Intravenous  Airway Management Planned: Nasal Cannula  Additional Equipment:   Intra-op Plan:   Post-operative Plan:   Informed Consent: I have reviewed the patients History and Physical, chart, labs and discussed the procedure including the risks, benefits and alternatives for the proposed anesthesia with the patient or authorized representative who has indicated his/her understanding and acceptance.     Plan Discussed with:   Anesthesia Plan Comments:        Anesthesia Quick Evaluation

## 2014-08-21 NOTE — Progress Notes (Signed)
  Echocardiogram 2D Echocardiogram has been performed.  Mark Jackson FRANCES 08/21/2014, 2:11 PM

## 2014-08-21 NOTE — Progress Notes (Signed)
PROGRESS NOTE  Mark Jackson. [id number] DOB: Jan 04, 1930 DOA: 08/20/2014 PCP: Jenny Reichmann, MD  Assessment/Plan: Atrial flutter with RVR -appreciate cardiology consult -Echocardiogram -TSH ok -Continue diltiazem and heparin drip per cards-- ? Coumadin candidate  CKD stage III -Baseline creatinine 1.5-1.8 -Gentle hydration -Discontinue losartan and HCTZ  Leukocytosis -It is unclear exactly what was injected during the patient's epidural injection proximal 6 days ago--certainly if this was steroids it may contribute to the patient's leukocytosis -Urinalysis negative for pyuria -Chest x-ray negative infiltrates -Blood cultures 2 sets -will not start abx as pt is afebrile and hemodynamically stable -monitor  Chest pain -Cycle troponins  Lower extremity pain and edema -Venous duplex to rule out DVT -If it is positive for DVT, the patient may need CT angiogram of the chest to rule out PE as possible etiology for the patient's atrial flutter  Hyperlipidemia -continue statin -check lipid panel  Code Status: full Family Communication: patient Disposition Plan:    Consultants:  cardiology  Procedures:      HPI/Subjective: Asking about when he will go home  Objective: Filed Vitals:   08/21/14 0740  BP: 103/53  Pulse: 84  Temp: 98.5 F (36.9 C)  Resp: 18    Intake/Output Summary (Last 24 hours) at 08/21/14 1011 Last data filed at 08/21/14 0840  Gross per 24 hour  Intake 834.25 ml  Output   1500 ml  Net -665.75 ml   Filed Weights   08/20/14 1129 08/20/14 2030 08/21/14 0400  Weight: 90.719 kg (200 lb) 88.678 kg (195 lb 8 oz) 88.3 kg (194 lb 10.7 oz)    Exam:   General:  Pleasant/cooperative  Cardiovascular: irreg  Respiratory: clear  Abdomen: +BS, soft  Musculoskeletal: no edema   Data Reviewed: Basic Metabolic Panel:  Recent Labs Lab 08/20/14 1138  NA 136  K 4.5  CL 107  CO2 23  GLUCOSE 105*  BUN 40*  CREATININE 1.91*    CALCIUM 8.5   Liver Function Tests: No results for input(s): AST, ALT, ALKPHOS, BILITOT, PROT, ALBUMIN in the last 168 hours. No results for input(s): LIPASE, AMYLASE in the last 168 hours. No results for input(s): AMMONIA in the last 168 hours. CBC:  Recent Labs Lab 08/20/14 1138 08/21/14 0830  WBC 19.5* 17.7*  NEUTROABS  --  14.7*  HGB 10.9* 10.5*  HCT 33.1* 32.2*  MCV 95.9 96.7  PLT 391 404*   Cardiac Enzymes:  Recent Labs Lab 08/20/14 2200 08/21/14 0129  TROPONINI <0.03 <0.03   BNP (last 3 results) No results for input(s): PROBNP in the last 8760 hours. CBG: No results for input(s): GLUCAP in the last 168 hours.  No results found for this or any previous visit (from the past 240 hour(s)).   Studies: Dg Chest Port 1 View  08/20/2014   CLINICAL DATA:  Sternal chest pain, shortness of breath, weakness.  EXAM: PORTABLE CHEST - 1 VIEW  COMPARISON:  10/08/2011  FINDINGS: Studies AP lordotic in positioning. Heart and mediastinal contours are within normal limits. No focal opacities or effusions. No acute bony abnormality.  IMPRESSION: No active disease.   Electronically Signed   By: Rolm Baptise M.D.   On: 08/20/2014 12:40    Scheduled Meds: . ferrous sulfate  325 mg Oral Q breakfast  . rosuvastatin  5 mg Oral q1800  . sodium chloride  3 mL Intravenous Q12H  . topiramate  100 mg Oral QHS   Continuous Infusions: . sodium chloride 75 mL/hr at 08/20/14 2207  .  diltiazem (CARDIZEM) infusion 10 mg/hr (08/21/14 0901)  . heparin 1,200 Units/hr (08/20/14 1721)   Antibiotics Given (last 72 hours)    None      Principal Problem:   Atrial fibrillation with rapid ventricular response Active Problems:   Essential hypertension   Carotid artery disease   Hyperlipidemia   Leukocytosis   Atrial fibrillation with RVR   Atrial flutter   CKD (chronic kidney disease) stage 3, GFR 30-59 ml/min    Time spent: 35 min    Alaura Schippers, [facility] Pager  [phone]. If 7PM-7AM, please contact night-coverage at [email or web address], password [facility] 08/21/2014, 10:11 AM  LOS: 1 day

## 2014-08-21 NOTE — Progress Notes (Signed)
Subjective: Feels better today, HR controlled in a flutter on dilt and heparin  Objective: Vital signs in last 24 hours: Temp:  [98.3 F (36.8 C)-99.4 F (37.4 C)] 98.5 F (36.9 C) (01/12 0740) Pulse Rate:  [52-144] 84 (01/12 0740) Resp:  [10-29] 18 (01/12 0740) BP: (97-148)/(51-93) 103/53 mmHg (01/12 0740) SpO2:  [95 %-100 %] 98 % (01/12 0740) Weight:  [194 lb 10.7 oz (88.3 kg)-200 lb (90.719 kg)] 194 lb 10.7 oz (88.3 kg) (01/12 0400) Weight change:    Intake/Output from previous day: -733 wt 194 down from 200 in ER 01/11 0701 - 01/12 0700 In: 591.3 [I.V.:591.3] Out: 1325 [Urine:1325] Intake/Output this shift:    PE: General:Pleasant affect, NAD Skin:Warm and dry, brisk capillary refill HEENT:normocephalic, sclera clear, mucus membranes moist Heart:S1S2 IRREG irreg without murmur, gallup, rub or click Lungs:clear without rales, rhonchi, or wheezes [id number], non tender, + BS, do not palpate liver spleen or masses Ext:no lower ext edema, 2+ pedal pulses, 2+ radial pulses Neuro:alert and oriented X 3, MAE, follows commands, + facial symmetry Tele: a flutter rate controlled at 75 or so.     Lab Results:  Recent Labs  08/20/14 1138  WBC 19.5*  HGB 10.9*  HCT 33.1*  PLT 391   BMET  Recent Labs  08/20/14 1138  NA 136  K 4.5  CL 107  CO2 23  GLUCOSE 105*  BUN 40*  CREATININE 1.91*  CALCIUM 8.5    Recent Labs  08/20/14 2200 08/21/14 0129  TROPONINI <0.03 <0.03    Lab Results  Component Value Date   CHOL 114 08/15/2013   HDL 37* 08/15/2013   LDLCALC 64 08/15/2013   TRIG 63 08/15/2013   CHOLHDL 3.1 08/15/2013   Lab Results  Component Value Date   HGBA1C 6.0* 01/12/2014     Lab Results  Component Value Date   TSH 1.640 08/20/2014    Hepatic Function Panel No results for input(s): PROT, ALBUMIN, AST, ALT, ALKPHOS, BILITOT, BILIDIR, IBILI in the last 72 hours. No results for input(s): CHOL in the last 72 hours. No results for  input(s): PROTIME in the last 72 hours.     Studies/Results: Dg Chest Port 1 View  08/20/2014   CLINICAL DATA:  Sternal chest pain, shortness of breath, weakness.  EXAM: PORTABLE CHEST - 1 VIEW  COMPARISON:  10/08/2011  FINDINGS: Studies AP lordotic in positioning. Heart and mediastinal contours are within normal limits. No focal opacities or effusions. No acute bony abnormality.  IMPRESSION: No active disease.   Electronically Signed   By: Rolm Baptise M.D.   On: 08/20/2014 12:40    Medications: I have reviewed the patient's current medications. Scheduled Meds: . ferrous sulfate  325 mg Oral Q breakfast  . rosuvastatin  5 mg Oral q1800  . sodium chloride  3 mL Intravenous Q12H  . topiramate  100 mg Oral QHS   Continuous Infusions: . sodium chloride 75 mL/hr at 08/20/14 2207  . diltiazem (CARDIZEM) infusion 15 mg/hr (08/21/14 0535)  . heparin 1,200 Units/hr (08/20/14 1721)   PRN Meds:.acetaminophen **OR** acetaminophen, HYDROcodone-homatropine, ondansetron **OR** ondansetron (ZOFRAN) IV  Assessment/Plan: 81 yo male with subacute onset symptoms, including productive cough, dyspnea, chest pain and dizziness. He has low grade fever on arrival at 99.4. Labs indicate a leukocytosis at 19K. There is acute on chronic kidney disease with a creatinine of 1.9. BNP is elevated at 417. CXR is unremarkable. I'm concerned about occult infection, this may  be responsible for his atrial flutter. We will recommend anticoagulation with heparin (he is not like a good NOAC candidate due to his CKD) and rate control with IV diltiazem. He may ultimately need DCCV.  1. New onset atrial flutter with RVR, now rate controlled - CHA2DS2-Vasc score 4 (age, HTN, vascular disease) - obtain echo Pending to assess LV function, TSH is normal. Check orthostatic BP as some of his dizziness worse when stand up. - continue heparin gtt, not a good NOAC candidate with Cr 1.91, will consider  coumadin (although pt was previous taken off plavix due to h/o severe ecchymosis)- begin coumadin, Plan for DCCV/ TEE tomorrow since he still is SOB despite controlled HR in afib  - Decrease dilt gtt due to soft BP  2. Chest pain, persistent for the past 2 weeks - likely related to #1,   - negative trop X 3 after having persistent CP for 2 wks.  Will consider outpt myoview if CP persists after DCCV  3. Leukocytosis - unclear cause, U/A negative, no sign of PNA on CXR.  - described coughing up thick grayish phlegm ? Bronchitis but lungs are clear             - Fever 99 on admit has currently resolved.  - Received steroid injection several days ago which can elevate WBC  4. Chronic diastolic heart failure - fluid status stable, although describe having a cough, no rale on exam. Mild LE pitting edema.  - depend on echo finding, may change HCTZ to lasix  5. Hypertension- controlled to soft at 98/53- decrease dilt to 10 mg/hr 6. Hyperlipidemia 7. carotid artery stenosis: followed by Dr. Gwenlyn Found, stable, moderate 8. stage III chronic kidney disease 9. history of syncope     LOS: 1 day   Time spent with pt. :15 minutes. [facility] R  Nurse Practitioner Certified Pager [phone] or after 5pm and on weekends call [phone] 08/21/2014, 8:08 AM  I have personally seen and examined and agree with note above as outlined by Cecilie Kicks NP with minor changes.  Patient has had constant CP for 2 weeks with normal enzymes.  Found to be in atrial flutter with RVR and placed on IV Cardizem gtt. Rate controlled now.  Agree with decreasing dose due to soft BP.  He remains SOB despite controlled HR.  I think he would benefit from TEE/DCCV.  He is not a candidate for NOAC due to underlying renal insuff.  He is on IV Heparin at present.  He has a history of ecchymosis in the past with Plavix.  Will start on coumadin and plan TEE/DCCV in  am.  This was discussed with Dr. Eliseo Squires and she feels patient stable enough for Coumadin.  I have discussed risks and benefits of TEE/DCCV with patient and he understands and agrees to proceed.    Signed: Fransico Him, MD [facility] 08/21/2014

## 2014-08-21 NOTE — Progress Notes (Signed)
ANTICOAGULATION CONSULT NOTE - Follow Up Consult  Pharmacy Consult for Heparin Indication: atrial fibrillation  Allergies  Allergen Reactions  . Plavix [Clopidogrel Bisulfate]     Significant bruising    Patient Measurements: Height: 5\' 8"  (172.7 cm) Weight: 194 lb 10.7 oz (88.3 kg) IBW/kg (Calculated) : 68.4 Heparin Dosing Weight:   Vital Signs: Temp: 99.1 F (37.3 C) (01/12 1927) Temp Source: Oral (01/12 1927) BP: 108/66 mmHg (01/12 1927) Pulse Rate: 90 (01/12 1927)  Labs:  Recent Labs  08/20/14 1138 08/20/14 2200 08/21/14 0129 08/21/14 0830 08/21/14 0953 08/21/14 1328 08/21/14 1936  HGB 10.9*  --   --  10.5*  --   --   --   HCT 33.1*  --   --  32.2*  --   --   --   PLT 391  --   --  404*  --   --   --   LABPROT  --   --   --   --   --  15.2  --   INR  --   --   --   --   --  1.19  --   HEPARINUNFRC  --   --  0.30  --  0.10*  --  0.37  CREATININE 1.91*  --   --  1.93*  --   --   --   TROPONINI  --  <0.03 <0.03 <0.03  --   --   --     Estimated Creatinine Clearance: 31.9 mL/min (by C-G formula based on Cr of 1.93).   Medications:  Scheduled:  . ferrous sulfate  325 mg Oral Q breakfast  . rosuvastatin  5 mg Oral q1800  . sodium chloride  3 mL Intravenous Q12H  . sodium chloride  3 mL Intravenous Q12H  . topiramate  100 mg Oral QHS  . Warfarin - Pharmacist Dosing Inpatient   Does not apply q1800    Assessment: 81yo male with AFib, on Heparin which was sub-therapeutic this AM at 0.1.  Hg 10.5 and stable, pltc 404.  RN reports some pump problems noted by HS-RN, but none during today. Coumadin is being considered; there is no baseline INR.  No bleeding noted. Heparin level 0.37 now in goal range.  Goal of Therapy:  Heparin level 0.3-0.7 units/ml Monitor platelets by anticoagulation protocol: Yes   Plan:  Continue Heparin at 1350 units/hr Daily HL, CBC  Taichi Repka S. Alford Highland, PharmD, [place] Clinical Staff Pharmacist Pager [phone]    08/21/14   Pharmacy- Coumadin 1155  To add Coumadin, no baseline INR available at this time but no reason to suspect would be abnormal.  Goal INR 2-3.  1-  INR now 2-  Coumadin 7.5mg  this PM, adjust dosing if necessary based on baseline result 3-  Daily INR  Gracy Bruins, PharmD Clinical Pharmacist [facility]

## 2014-08-21 NOTE — Progress Notes (Signed)
[place] for Heparin Indication: atrial fibrillation  Allergies  Allergen Reactions  . Plavix [Clopidogrel Bisulfate]     Significant bruising    Patient Measurements: Height: 5\' 8"  (172.7 cm) Weight: 195 lb 8 oz (88.678 kg) IBW/kg (Calculated) : 68.4 Heparin Dosing Weight: 87 kg  Vital Signs: Temp: 98.7 F (37.1 C) (01/11 2359) Temp Source: Oral (01/11 2359) BP: 102/51 mmHg (01/11 2359) Pulse Rate: 76 (01/11 2359)  Labs:  Recent Labs  08/20/14 1138 08/20/14 2200 08/21/14 0129  HGB 10.9*  --   --   HCT 33.1*  --   --   PLT 391  --   --   HEPARINUNFRC  --   --  0.30  CREATININE 1.91*  --   --   TROPONINI  --  <0.03  --     Estimated Creatinine Clearance: 32.3 mL/min (by C-G formula based on Cr of 1.91).  Assessment: 81 yo male with Aflutter for heparin  Goal of Therapy:  Heparin level 0.3-0.7 units/ml Monitor platelets by anticoagulation protocol: Yes   Plan:  Continue Heparin at current rate for now Recheck level later this morning with am labs  Phillis Knack, PharmD, BCPS  08/21/2014,2:02 AM

## 2014-08-21 NOTE — Progress Notes (Signed)
*  Preliminary Results* Bilateral lower extremity venous duplex completed. Bilateral lower extremities are negative for deep vein thrombosis. There is no evidence of Baker's cyst bilaterally.  08/21/2014  Maudry Mayhew, RVT, RDCS, RDMS

## 2014-08-22 ENCOUNTER — Encounter (HOSPITAL_COMMUNITY): Payer: Self-pay | Admitting: Critical Care Medicine

## 2014-08-22 ENCOUNTER — Encounter (HOSPITAL_COMMUNITY)
Admission: EM | Disposition: A | Discharge status: Home / Self Care | Payer: Self-pay | Source: Home / Self Care | Attending: Internal Medicine

## 2014-08-22 ENCOUNTER — Inpatient Hospital Stay (HOSPITAL_COMMUNITY): Payer: Medicare Other | Admitting: Anesthesiology

## 2014-08-22 DIAGNOSIS — I350 Nonrheumatic aortic (valve) stenosis: Secondary | ICD-10-CM

## 2014-08-22 DIAGNOSIS — I4891 Unspecified atrial fibrillation: Secondary | ICD-10-CM

## 2014-08-22 DIAGNOSIS — I5041 Acute combined systolic (congestive) and diastolic (congestive) heart failure: Secondary | ICD-10-CM

## 2014-08-22 HISTORY — PX: TEE WITHOUT CARDIOVERSION: SHX5443

## 2014-08-22 HISTORY — PX: CARDIOVERSION: SHX1299

## 2014-08-22 LAB — CBC WITH DIFFERENTIAL/PLATELET
Basophils Absolute: 0 10*3/uL (ref 0.0–0.1)
Basophils Relative: 0 % (ref 0–1)
EOS PCT: 2 % (ref 0–5)
Eosinophils Absolute: 0.3 10*3/uL (ref 0.0–0.7)
HEMATOCRIT: 29.5 % — AB (ref 39.0–52.0)
Hemoglobin: 9.5 g/dL — ABNORMAL LOW (ref 13.0–17.0)
LYMPHS ABS: 1.7 10*3/uL (ref 0.7–4.0)
LYMPHS PCT: 9 % — AB (ref 12–46)
MCH: 31.5 pg (ref 26.0–34.0)
MCHC: 32.2 g/dL (ref 30.0–36.0)
MCV: 97.7 fL (ref 78.0–100.0)
MONOS PCT: 9 % (ref 3–12)
Monocytes Absolute: 1.7 10*3/uL — ABNORMAL HIGH (ref 0.1–1.0)
Neutro Abs: 14.3 10*3/uL — ABNORMAL HIGH (ref 1.7–7.7)
Neutrophils Relative %: 80 % — ABNORMAL HIGH (ref 43–77)
Platelets: 370 10*3/uL (ref 150–400)
RBC: 3.02 MIL/uL — ABNORMAL LOW (ref 4.22–5.81)
RDW: 12.7 % (ref 11.5–15.5)
WBC: 18 10*3/uL — ABNORMAL HIGH (ref 4.0–10.5)

## 2014-08-22 LAB — BASIC METABOLIC PANEL
Anion gap: 8 (ref 5–15)
BUN: 33 mg/dL — AB (ref 6–23)
CO2: 20 mmol/L (ref 19–32)
CREATININE: 1.94 mg/dL — AB (ref 0.50–1.35)
Calcium: 7.8 mg/dL — ABNORMAL LOW (ref 8.4–10.5)
Chloride: 106 mEq/L (ref 96–112)
GFR calc Af Amer: 35 mL/min — ABNORMAL LOW (ref >90)
GFR, EST NON AFRICAN AMERICAN: 30 mL/min — AB (ref >90)
GLUCOSE: 122 mg/dL — AB (ref 70–99)
POTASSIUM: 4.6 mmol/L (ref 3.5–5.1)
Sodium: 134 mmol/L — ABNORMAL LOW (ref 135–145)

## 2014-08-22 LAB — PROTIME-INR
INR: 1.34 (ref 0.00–1.49)
Prothrombin Time: 16.8 seconds — ABNORMAL HIGH (ref 11.6–15.2)

## 2014-08-22 LAB — HEPARIN LEVEL (UNFRACTIONATED): Heparin Unfractionated: 0.3 IU/mL (ref 0.30–0.70)

## 2014-08-22 LAB — APTT: aPTT: 97 seconds — ABNORMAL HIGH (ref 24–37)

## 2014-08-22 SURGERY — ECHOCARDIOGRAM, TRANSESOPHAGEAL
Anesthesia: Monitor Anesthesia Care

## 2014-08-22 MED ORDER — POLYETHYLENE GLYCOL 3350 17 G PO PACK: 17.0000 g | PACK | Freq: Every day | ORAL | Status: DC | PRN

## 2014-08-22 MED ORDER — WARFARIN SODIUM 7.5 MG PO TABS
7.5000 mg | ORAL_TABLET | Freq: Once | ORAL | Status: AC
  Administered 2014-08-22: 7.5 mg via ORAL
  Filled 2014-08-22: qty 1

## 2014-08-22 MED ORDER — FENTANYL CITRATE 0.05 MG/ML IJ SOLN
INTRAMUSCULAR | Status: DC | PRN
Start: 2014-08-22 — End: 2014-08-22
  Administered 2014-08-22: 25 ug via INTRAVENOUS

## 2014-08-22 MED ORDER — PROPOFOL 10 MG/ML IV BOLUS
INTRAVENOUS | Status: DC | PRN
  Administered 2014-08-22: 40 mg via INTRAVENOUS

## 2014-08-22 MED ORDER — PROPOFOL INFUSION 10 MG/ML OPTIME
INTRAVENOUS | Status: DC | PRN
  Administered 2014-08-22: 200 ug/kg/min via INTRAVENOUS

## 2014-08-22 MED ORDER — COUMADIN BOOK
Freq: Once | Status: AC
  Administered 2014-08-22: 12:00:00
  Filled 2014-08-22: qty 1

## 2014-08-22 MED ORDER — WARFARIN VIDEO: Freq: Once | Status: DC

## 2014-08-22 MED ORDER — BUTAMBEN-TETRACAINE-BENZOCAINE 2-2-14 % EX AERO
INHALATION_SPRAY | CUTANEOUS | Status: DC | PRN
  Administered 2014-08-22: 2 via TOPICAL

## 2014-08-22 NOTE — Transfer of Care (Signed)
Immediate Anesthesia Transfer of Care Note  Patient: Mark Jackson.  Procedure(s) Performed: Procedure(s): TRANSESOPHAGEAL ECHOCARDIOGRAM (TEE) (N/A) CARDIOVERSION (N/A)  Patient Location: Endoscopy Unit  Anesthesia Type:MAC  Level of Consciousness: sedated  Airway & Oxygen Therapy: Patient Spontanous Breathing and Patient connected to nasal cannula oxygen  Post-op Assessment: Report given to PACU RN and Post -op Vital signs reviewed and stable  Post vital signs: Reviewed and stable  Complications: No apparent anesthesia complications

## 2014-08-22 NOTE — Anesthesia Postprocedure Evaluation (Signed)
  Anesthesia Post-op Note  Patient: Mark Jackson.  Procedure(s) Performed: Procedure(s): TRANSESOPHAGEAL ECHOCARDIOGRAM (TEE) (N/A) CARDIOVERSION (N/A)  Patient Location: PACU  Anesthesia Type:MAC  Level of Consciousness: awake and alert   Airway and Oxygen Therapy: Patient Spontanous Breathing and Patient connected to nasal cannula oxygen  Post-op Pain: none  Post-op Assessment: Post-op Vital signs reviewed and Patient's Cardiovascular Status Stable  Post-op Vital Signs: Reviewed and stable  Last Vitals:  Filed Vitals:   08/22/14 1305  BP: 103/50  Pulse: 72  Temp:   Resp: 19    Complications: No apparent anesthesia complications

## 2014-08-22 NOTE — Discharge Instructions (Addendum)
Information on my medicine - Coumadin®   (Warfarin) ° °This medication education was reviewed with me or my healthcare representative as part of my discharge preparation.  The pharmacist that spoke with me during my hospital stay was:  Dugger, Jennifer Danielle, RPH ° °Why was Coumadin prescribed for you? °Coumadin was prescribed for you because you have a blood clot or a medical condition that can cause an increased risk of forming blood clots. Blood clots can cause serious health problems by blocking the flow of blood to the heart, lung, or brain. Coumadin can prevent harmful blood clots from forming. °As a reminder your indication for Coumadin is:   Stroke Prevention Because Of Atrial Fibrillation ° °What test will check on my response to Coumadin? °While on Coumadin (warfarin) you will need to have an INR test regularly to ensure that your dose is keeping you in the desired range. The INR (international normalized ratio) number is calculated from the result of the laboratory test called prothrombin time (PT). ° °If an INR APPOINTMENT HAS NOT ALREADY BEEN MADE FOR YOU please schedule an appointment to have this lab work done by your health care provider within 7 days. °Your INR goal is usually a number between:  2 to 3 or your provider may give you a more narrow range like 2-2.5.  Ask your health care provider during an office visit what your goal INR is. ° °What  do you need to  know  About  COUMADIN? °Take Coumadin (warfarin) exactly as prescribed by your healthcare provider about the same time each day.  DO NOT stop taking without talking to the doctor who prescribed the medication.  Stopping without other blood clot prevention medication to take the place of Coumadin may increase your risk of developing a new clot or stroke.  Get refills before you run out. ° °What do you do if you miss a dose? °If you miss a dose, take it as soon as you remember on the same day then continue your regularly scheduled regimen  the next day.  Do not take two doses of Coumadin at the same time. ° °Important Safety Information °A possible side effect of Coumadin (Warfarin) is an increased risk of bleeding. You should call your healthcare provider right away if you experience any of the following: °? Bleeding from an injury or your nose that does not stop. °? Unusual colored urine (red or dark brown) or unusual colored stools (red or black). °? Unusual bruising for unknown reasons. °? A serious fall or if you hit your head (even if there is no bleeding). ° °Some foods or medicines interact with Coumadin® (warfarin) and might alter your response to warfarin. To help avoid this: °? Eat a balanced diet, maintaining a consistent amount of Vitamin K. °? Notify your provider about major diet changes you plan to make. °? Avoid alcohol or limit your intake to 1 drink for women and 2 drinks for men per day. °(1 drink is 5 oz. wine, 12 oz. beer, or 1.5 oz. liquor.) ° °Make sure that ANY health care provider who prescribes medication for you knows that you are taking Coumadin (warfarin).  Also make sure the healthcare provider who is monitoring your Coumadin knows when you have started a new medication including herbals and non-prescription products. ° °Coumadin® (Warfarin)  Major Drug Interactions  °Increased Warfarin Effect Decreased Warfarin Effect  °Alcohol (large quantities) °Antibiotics (esp. Septra/Bactrim, Flagyl, Cipro) °Amiodarone (Cordarone) °Aspirin (ASA) °Cimetidine (Tagamet) °Megestrol (Megace) °NSAIDs (ibuprofen,   naproxen, etc.) Piroxicam (Feldene) Propafenone (Rythmol SR) Propranolol (Inderal) Isoniazid (INH) Posaconazole (Noxafil) Barbiturates (Phenobarbital) Carbamazepine (Tegretol) Chlordiazepoxide (Librium) Cholestyramine (Questran) Griseofulvin Oral Contraceptives Rifampin Sucralfate (Carafate) Vitamin K   Coumadin (Warfarin) Major Herbal Interactions  Increased Warfarin Effect Decreased Warfarin Effect   Garlic Ginseng Ginkgo biloba Coenzyme Q10 Green tea St. Johns wort    Coumadin (Warfarin) FOOD Interactions  Eat a consistent number of servings per week of foods HIGH in Vitamin K (1 serving =  cup)  Collards (cooked, or boiled & drained) Kale (cooked, or boiled & drained) Mustard greens (cooked, or boiled & drained) Parsley *serving size only =  cup Spinach (cooked, or boiled & drained) Swiss chard (cooked, or boiled & drained) Turnip greens (cooked, or boiled & drained)  Eat a consistent number of servings per week of foods MEDIUM-HIGH in Vitamin K (1 serving = 1 cup)  Asparagus (cooked, or boiled & drained) Broccoli (cooked, boiled & drained, or raw & chopped) Brussel sprouts (cooked, or boiled & drained) *serving size only =  cup Lettuce, raw (green leaf, endive, romaine) Spinach, raw Turnip greens, raw & chopped   These websites have more information on Coumadin (warfarin):  FailFactory.se; VeganReport.com.au;  Enoxaparin, Home Use Enoxaparin (Lovenox) injection is a medication used to prevent clots from developing in your veins. Medications such as enoxaparin are called blood thinners or anticoagulants. If blood clots are untreated they could travel to your lungs. This is called a pulmonary embolus. A blood clot in your lungs can be fatal. Caregivers often use anticoagulants such as enoxaparin to prevent clots following surgery. It is also used along with aspirin when the heart is not getting enough blood. Continue the enoxaparin injections as directed by your caregiver. Your caregiver will use blood clotting test results to decide when you can safely stop using enoxaparin injections. If your caregiver prescribes any additional anticoagulant, you must take it exactly as directed. RISKS AND COMPLICATIONS  If you have received recent epidural anesthesia, spinal anesthesia, or a spinal tap while receiving anticoagulants, you are at risk for developing a  blood clot in or around the spine. This condition could result in long-term or permanent paralysis.  Because anticoagulants thin your blood, severe bleeding may occur from any tissue or organ. Symptoms of the blood being too thin may include:  Bleeding from the nose or gums that does not stop quickly.  Unusual bruising or bruising easily.  Swelling or pain at an injection site.  A cut that does not stop bleeding within 10 minutes.  Continual nausea for more than 1 day or vomiting blood.  Coughing up blood.  Blood in the urine which may appear as pink, red, or brown urine.  Blood in bowel movements which may appear as red, dark or black stools.  Sudden weakness or numbness of the face, arm, or leg, especially on one side of the body.  Sudden confusion.  Trouble speaking (aphasia) or understanding.  Sudden trouble seeing in one or both eyes.  Sudden trouble walking.  Dizziness.  Loss of balance or coordination.  Severe pain, such as a headache, joint pain, or back pain.  Fever.  Bruising around the injection sites may be expected.  Platelet drops, known as "thrombocytopenia," can occur with enoxaparin use. A condition called "heparin-induced thrombocytopenia" has been seen. If you have had this condition, you should tell your caregiver. Your caregiver may direct you to have blood tests to monitor this condition.  Do not use if you have allergies to the  medication, heparin, or pork products.  Other side effects may include mild local reactions or irritation at the site of injection, pain, bruising, and redness of skin. HOME CARE INSTRUCTIONS You will be instructed by your caregiver how to give enoxaparin injections. 1. Before giving your medication you should make sure the injection is a clear and colorless or pale yellow solution. If your medication becomes discolored or has particles in the bottle, do not use and notify your caregiver. 2. When using the 30 and 40 mg  pre-filled syringes, do not expel the air bubble from the syringe before the injection. This makes sure you use all the medication in the syringe. 3. The injections will be given subcutaneously. This means it is given into the fat over the belly (abdomen). It is given deep beneath the skin but not into the muscle. The shots should be injected around the abdominal wall. Change the sites of injection each time. The whole length of the needle should be introduced into a skin fold held between the thumb and forefinger; the skin fold should be held throughout the injection. Do not rub the injection site after completion of the injection. This increases bruising. Enoxaparin injection pre-filled syringes and graduated pre-filled syringes are available with a system that shields the needle after injection. 4. Inject by pushing the plunger to the bottom of the syringe. 5. Remove the syringe from the injection site keeping your finger on the plunger rod. Be careful not to stick yourself or others. 6. After injection and the syringe is empty, set off the safety system by firmly pushing the plunger rod. The protective sleeve will automatically cover the needle and you can hear a click. The click means your needle is safely covered. Do not try replacing the needle shield. 7. Get rid of the syringe in the nearest sharps container. 8. Keep your medication safely stored at room temperatures.  Due to the complications of anticoagulants, it is very important that you take your anticoagulant as directed by your caregiver. Anticoagulants need to be taken exactly as instructed. Be sure you understand all your anticoagulant instructions.  Changes in medicines, supplements, diet, and illness can affect your anticoagulation therapy. Be sure to inform your caregivers of any of these changes.  While on anticoagulants, you will need to have blood tests done routinely as directed by your caregivers.  Be careful not to cut  yourself when using sharp objects.  Limit physical activities or sports that could result in a fall or cause injury.  It is extremely important that you tell all of your caregivers and dentist that you are taking an anticoagulant, especially if you are injured or plan to have any type of procedure or operation.  Follow up with your laboratory test and caregiver appointments as directed. It is very important to keep your appointments. Not keeping appointments could result in a chronic or permanent injury, pain, or disability. SEEK MEDICAL CARE IF:  You develop any rashes.  You have any worsening of the condition for which you are receiving anticoagulation therapy. SEEK IMMEDIATE MEDICAL CARE IF:  Bleeding from the nose or gums does not stop quickly.  You have unusual bruising or are bruising easily.  Swelling or pain occurs at an injection site.  A cut does not stop bleeding within 10 minutes.  You have continual nausea for more than 1 day or are vomiting blood.  You are coughing up blood.  You have blood in the urine.  You have dark or  black stools.  You have sudden weakness or numbness of the face, arm, or leg, especially on one side of the body.  You have sudden confusion.  You have trouble speaking (aphasia) or understanding.  You have sudden trouble seeing in one or both eyes.  You have sudden trouble walking.  You have dizziness.  You have a loss of balance or coordination.  You have severe pain, such as a headache, joint pain, or back pain.  You have a serious fall or head injury, even if you are not bleeding.  You have an oral temperature above 102 F (38.9 C), not controlled by medicine. ANY OF THESE SYMPTOMS MAY REPRESENT A SERIOUS PROBLEM THAT IS AN EMERGENCY. Do not wait to see if the symptoms will go away. Get medical help right away. Call your local emergency services (911 in [place] DO NOT drive yourself to the hospital. MAKE SURE YOU:  Understand  these instructions.  Will watch your condition.  Will get help right away if you are not doing well or get worse. Document Released: 05/28/2004 Document Revised: 10/19/2011 Document Reviewed: 07/27/2005 [facility] Patient Information 2015 [place], [place]. This information is not intended to replace advice given to you by your health care provider. Make sure you discuss any questions you have with your health care provider.

## 2014-08-22 NOTE — Interval H&P Note (Signed)
History and Physical Interval Note:  08/22/2014 8:13 AM  Mark Jackson.  has presented today for surgery, with the diagnosis of A FIB   The various methods of treatment have been discussed with the patient and family. After consideration of risks, benefits and other options for treatment, the patient has consented to  Procedure(s): TRANSESOPHAGEAL ECHOCARDIOGRAM (TEE) (N/A) CARDIOVERSION (N/A) as a surgical intervention .  The patient's history has been reviewed, patient examined, no change in status, stable for surgery.  I have reviewed the patient's chart and labs.  Questions were answered to the patient's satisfaction.     Jenkins Rouge

## 2014-08-22 NOTE — Progress Notes (Signed)
PROGRESS NOTE  Mark Jackson. [id number] DOB: 1930-02-18 DOA: 08/20/2014 PCP: Jenny Reichmann, MD  Assessment/Plan: Atrial flutter with RVR -appreciate cardiology consult -Echocardiogram -TSH ok -Continue diltiazem and heparin drip per cards--  Coumadin  CKD stage III -Baseline creatinine 1.5-1.8 -Gentle hydration -Discontinue losartan and HCTZ  Leukocytosis -It is unclear exactly what was injected during the patient's epidural injection proximal 6 days ago--certainly if this was steroids it may contribute to the patient's leukocytosis -Urinalysis negative for pyuria -Chest x-ray negative infiltrates -Blood cultures 2 sets pending -check smear -will not start abx as pt is afebrile and hemodynamically stable -monitor  Chest pain -Cycle troponins  Lower extremity pain and edema -DVT negative  Hyperlipidemia -continue statin -LDL: 57  Code Status: full Family Communication: patient Disposition Plan:    Consultants:  cardiology  Procedures:      HPI/Subjective: Asking about when he will go home  Objective: Filed Vitals:   08/22/14 0500  BP: 108/63  Pulse: 71  Temp: 97.8 F (36.6 C)  Resp: 20    Intake/Output Summary (Last 24 hours) at 08/22/14 1030 Last data filed at 08/22/14 0952  Gross per 24 hour  Intake    249 ml  Output   1025 ml  Net   -776 ml   Filed Weights   08/20/14 2030 08/21/14 0400 08/22/14 0500  Weight: 88.678 kg (195 lb 8 oz) 88.3 kg (194 lb 10.7 oz) 88.451 kg (195 lb)    Exam:   General:  Pleasant/cooperative  Cardiovascular: irreg  Respiratory: clear  Abdomen: +BS, soft  Musculoskeletal: no edema   Data Reviewed: Basic Metabolic Panel:  Recent Labs Lab 08/20/14 1138 08/21/14 0830 08/22/14 0246  NA 136 138 134*  K 4.5 4.0 4.6  CL 107 107 106  CO2 23 23 20   GLUCOSE 105* 137* 122*  BUN 40* 35* 33*  CREATININE 1.91* 1.93* 1.94*  CALCIUM 8.5 8.2* 7.8*   Liver Function Tests:  Recent Labs Lab  08/21/14 0830  AST 23  ALT 19  ALKPHOS 50  BILITOT 0.7  PROT 6.7  ALBUMIN 2.4*   No results for input(s): LIPASE, AMYLASE in the last 168 hours. No results for input(s): AMMONIA in the last 168 hours. CBC:  Recent Labs Lab 08/20/14 1138 08/21/14 0830 08/22/14 0246  WBC 19.5* 17.7* 18.0*  NEUTROABS  --  14.7* 14.3*  HGB 10.9* 10.5* 9.5*  HCT 33.1* 32.2* 29.5*  MCV 95.9 96.7 97.7  PLT 391 404* 370   Cardiac Enzymes:  Recent Labs Lab 08/20/14 2200 08/21/14 0129 08/21/14 0830  TROPONINI <0.03 <0.03 <0.03   BNP (last 3 results) No results for input(s): PROBNP in the last 8760 hours. CBG: No results for input(s): GLUCAP in the last 168 hours.  Recent Results (from the past 240 hour(s))  Culture, blood (routine x 2)     Status: None (Preliminary result)   Collection Time: 08/20/14 10:00 PM  Result Value Ref Range Status   Specimen Description BLOOD LEFT ARM  Final   Special Requests BOTTLES DRAWN AEROBIC AND ANAEROBIC 10CC  Final   Culture   Final           BLOOD CULTURE RECEIVED NO GROWTH TO DATE CULTURE WILL BE HELD FOR 5 DAYS BEFORE ISSUING A FINAL NEGATIVE REPORT Performed at [facility]    Report Status PENDING  Incomplete  Culture, blood (routine x 2)     Status: None (Preliminary result)   Collection Time: 08/20/14 10:10 PM  Result  Value Ref Range Status   Specimen Description BLOOD LEFT HAND  Final   Special Requests BOTTLES DRAWN AEROBIC AND ANAEROBIC 10CC  Final   Culture   Final           BLOOD CULTURE RECEIVED NO GROWTH TO DATE CULTURE WILL BE HELD FOR 5 DAYS BEFORE ISSUING A FINAL NEGATIVE REPORT Performed at [facility]    Report Status PENDING  Incomplete     Studies: Dg Chest Port 1 View  08/20/2014   CLINICAL DATA:  Sternal chest pain, shortness of breath, weakness.  EXAM: PORTABLE CHEST - 1 VIEW  COMPARISON:  10/08/2011  FINDINGS: Studies AP lordotic in positioning. Heart and mediastinal contours are within normal limits. No  focal opacities or effusions. No acute bony abnormality.  IMPRESSION: No active disease.   Electronically Signed   By: Rolm Baptise M.D.   On: 08/20/2014 12:40    Scheduled Meds: . ferrous sulfate  325 mg Oral Q breakfast  . rosuvastatin  5 mg Oral q1800  . sodium chloride  3 mL Intravenous Q12H  . sodium chloride  3 mL Intravenous Q12H  . topiramate  100 mg Oral QHS  . Warfarin - Pharmacist Dosing Inpatient   Does not apply q1800   Continuous Infusions: . sodium chloride 75 mL/hr at 08/22/14 0026  . sodium chloride    . sodium chloride    . sodium chloride    . diltiazem (CARDIZEM) infusion 10 mg/hr (08/22/14 0951)  . heparin 1,350 Units/hr (08/22/14 [id number])   Antibiotics Given (last 72 hours)    None      Principal Problem:   Atrial fibrillation with rapid ventricular response Active Problems:   Essential hypertension   Carotid artery disease   Hyperlipidemia   Leukocytosis   Atrial fibrillation with RVR   Atrial flutter   CKD (chronic kidney disease) stage 3, GFR 30-59 ml/min    Time spent: 35 min    Tanvi Gatling, [facility] Pager [phone]. If 7PM-7AM, please contact night-coverage at [email or web address], password [facility] 08/22/2014, 10:30 AM  LOS: 2 days

## 2014-08-22 NOTE — Progress Notes (Signed)
  Echocardiogram Echocardiogram Transesophageal has been performed.  Mark Jackson 08/22/2014, 12:56 PM

## 2014-08-22 NOTE — Evaluation (Signed)
Physical Therapy Evaluation Patient Details Name: Mark Jackson. MRN: [id number] DOB: [birth year] Today's Date: 08/22/2014   History of Present Illness  Patient is a 81 y/o male with PMH of HTN, HLD, prostate cancer, right carotid stenosis, CKD stage III, diastolic CHF who presents with two-week history of persistent chest discomfort with associated SOB. Went to urgent care and found to be in A-flutter with RVR. s/p TEE/DCCV 1/13.    Clinical Impression  Patient presents with functional limitations due to deficits listed in PT problem list (see below). Pt with generalized weakness, deconditioning and balance deficits impacting safe mobility. Tolerated ambulation with min A due to unsteadiness at times. May benefit from use of AD for safety. Pt would benefit from skilled PT to improve gait, balance, and overall mobility so pt can maximize independence and return to PLOF.    Follow Up Recommendations Supervision/Assistance - 24 hour (TBD pending progress.)    Equipment Recommendations  None recommended by PT    Recommendations for Other Services OT consult     Precautions / Restrictions Precautions Precautions: Fall Restrictions Weight Bearing Restrictions: No      Mobility  Bed Mobility Overal bed mobility: Needs Assistance Bed Mobility: Supine to Sit;Sit to Supine     Supine to sit: Supervision;HOB elevated Sit to supine: Supervision;HOB elevated   General bed mobility comments: Supervision for safety. No physical assist needed. Use of rails.  Transfers Overall transfer level: Needs assistance Equipment used: None Transfers: Sit to/from Stand Sit to Stand: Min guard         General transfer comment: Min guard for safety. Sway noted with pt reaching for counter for support.  Ambulation/Gait Ambulation/Gait assistance: Min assist Ambulation Distance (Feet): 200 Feet Assistive device: None (Rail in hallway) Gait Pattern/deviations: Step-through pattern;Decreased  stride length;Drifts right/left Gait velocity: slow   General Gait Details: Pt with slow, unsteady gait with drifting noted at times esp with head movements or turns. Reaching for rail in hallway or wall for support. May benefit from RW (refused using it for eval). RHR 86 bpm, elevated to 99 bpm NSR.  Stairs            Wheelchair Mobility    Modified Rankin (Stroke Patients Only)       Balance Overall balance assessment: Needs assistance Sitting-balance support: Feet supported;No upper extremity supported Sitting balance-Leahy Scale: Good Sitting balance - Comments: Able to donn pj pants sitting EOB wihtout LOB or difficulty.    Standing balance support: During functional activity Standing balance-Leahy Scale: Fair Standing balance comment: Able to pull up PJ bottoms without difficulty with minor LOB posteriorly with LEs supported by bed.                             Pertinent Vitals/Pain Pain Assessment: No/denies pain    Home Living Family/patient expects to be discharged to:: Private residence Living Arrangements: Spouse/significant other Available Help at Discharge: Family;Available 24 hours/day Type of Home: House Home Access: Stairs to enter Entrance Stairs-Rails: Right Entrance Stairs-Number of Steps: 3 Home Layout: Two level;Able to live on main level with bedroom/bathroom Home Equipment: Gilford Rile - 2 wheels;Cane - single point      Prior Function Level of Independence: Independent         Comments: Pt still works.     Hand Dominance        Extremity/Trunk Assessment   Upper Extremity Assessment: Defer to OT evaluation;Overall [facility] for tasks assessed  Lower Extremity Assessment: Generalized weakness;RLE deficits/detail;LLE deficits/detail         Communication   Communication: No difficulties  Cognition Arousal/Alertness: Awake/alert Behavior During Therapy: WFL for tasks assessed/performed Overall Cognitive Status:  Within Functional Limits for tasks assessed                      General Comments General comments (skin integrity, edema, etc.): Education provided on increasing activity slowly.     Exercises        Assessment/Plan    PT Assessment Patient needs continued PT services  PT Diagnosis Difficulty walking;Generalized weakness   PT Problem List Decreased strength;Decreased activity tolerance;Decreased balance;Decreased mobility;Decreased safety awareness  PT Treatment Interventions Balance training;Gait training;Stair training;Patient/family education;Functional mobility training;Therapeutic activities;Therapeutic exercise;DME instruction   PT Goals (Current goals can be found in the Care Plan section) Acute Rehab PT Goals Patient Stated Goal: to increase walking PT Goal Formulation: With patient Time For Goal Achievement: 09/05/14 Potential to Achieve Goals: Fair    Frequency Min 3X/week   Barriers to discharge        Co-evaluation               End of Session Equipment Utilized During Treatment: Gait belt Activity Tolerance: Patient tolerated treatment well Patient left: in bed;with call bell/phone within reach;with family/visitor present Nurse Communication: Mobility status         Time: [id number] PT Time Calculation (min) (ACUTE ONLY): 19 min   Charges:   PT Evaluation $Initial PT Evaluation Tier I: 1 Procedure PT Treatments $Gait Training: 8-22 mins   PT G CodesCandy Sledge A 2014-09-17, 4:26 PM  Candy Sledge, [place], DPT [phone]

## 2014-08-22 NOTE — Progress Notes (Signed)
ANTICOAGULATION CONSULT NOTE - Follow Up Consult  Pharmacy Consult for heparin and warfarin Indication: atrial fibrillation  Allergies  Allergen Reactions  . Plavix [Clopidogrel Bisulfate]     Significant bruising    Patient Measurements: Height: 5\' 8"  (172.7 cm) Weight: 195 lb (88.451 kg) IBW/kg (Calculated) : 68.4 Heparin Dosing Weight:   Vital Signs: Temp: 97.8 F (36.6 C) (01/13 0500) Temp Source: Oral (01/13 0500) BP: 108/63 mmHg (01/13 0500) Pulse Rate: 71 (01/13 0500)  Labs:  Recent Labs  08/20/14 1138 08/20/14 2200  08/21/14 0129 08/21/14 0830 08/21/14 0953 08/21/14 1328 08/21/14 1936 08/22/14 0246  HGB 10.9*  --   --   --  10.5*  --   --   --  9.5*  HCT 33.1*  --   --   --  32.2*  --   --   --  29.5*  PLT 391  --   --   --  404*  --   --   --  370  APTT  --   --   --   --   --   --   --   --  97*  LABPROT  --   --   --   --   --   --  15.2  --  16.8*  INR  --   --   --   --   --   --  1.19  --  1.34  HEPARINUNFRC  --   --   < > 0.30  --  0.10*  --  0.37 0.30  CREATININE 1.91*  --   --   --  1.93*  --   --   --  1.94*  TROPONINI  --  <0.03  --  <0.03 <0.03  --   --   --   --   < > = values in this interval not displayed.  Estimated Creatinine Clearance: 31.7 mL/min (by C-G formula based on Cr of 1.94).   Medications:  . sodium chloride 75 mL/hr at 08/22/14 0026  . sodium chloride    . sodium chloride    . sodium chloride    . diltiazem (CARDIZEM) infusion 10 mg/hr (08/22/14 0951)  . heparin 1,350 Units/hr (08/22/14 [id number])    Assessment: 81 yo male with AFib, on heparin to bridge warfarin. He is having DCCV/TEE today. Heparin level is therapeutic this morning at 0.3 (on low end of goal). INR is subtherapeutic at 1.34. No bleeding noted, CBC is stable.  Goal of Therapy:  INR 2-3 Heparin level 0.3-0.7 units/ml Monitor platelets by anticoagulation protocol: Yes   Plan:  Increase heparin drip to1450 units/hr Warfarin 7.5 mg PO tonight Daily  heparin level, CBC, and INR Monitor for s/sx of bleeding Warfarin book and video  [facility], Pharm.D., BCPS Clinical Pharmacist Pager: [phone] 08/22/2014 10:32 AM

## 2014-08-22 NOTE — CV Procedure (Signed)
TEE/DCC:  Normal EF 60% Mild AS Trivial MR LAE No LAA thrombus Mild to moderate TR No ASD  DCC x 1 150 J with anesthesia providing sedation using propofol  Afib rate 96 converted to NSR rate 64  Successful TEE/DCC with no immediate neurologic sequelae   Jenkins Rouge

## 2014-08-22 NOTE — Progress Notes (Addendum)
SUBJECTIVE:  No complaints s/p TEE/DCCV  OBJECTIVE:   Vitals:   Filed Vitals:   08/22/14 1159 08/22/14 1254 08/22/14 1300 08/22/14 1305  BP:  96/58 99/55 103/50  Pulse:  73 68 72  Temp: 98.5 F (36.9 C) 98 F (36.7 C)    TempSrc: Oral Oral    Resp:  11 14 19   Height:      Weight:      SpO2:  96% 94% 97%   I&O's:   Intake/Output Summary (Last 24 hours) at 08/22/14 1321 Last data filed at 08/22/14 1133  Gross per 24 hour  Intake      9 ml  Output   1225 ml  Net  -1216 ml   TELEMETRY: Reviewed telemetry pt in NSR:     PHYSICAL EXAM General: Well developed, well nourished, in no acute distress Head: Eyes PERRLA, No xanthomas.   Normal cephalic and atramatic  Lungs:   Clear bilaterally to auscultation and percussion. Heart:   HRRR S1 S2 Pulses are 2+ & equal. Abdomen: Bowel sounds are positive, abdomen soft and non-tender without masses  Extremities:   No clubbing, cyanosis or edema.  DP +1 Neuro: Alert and oriented X 3. Psych:  Good affect, responds appropriately   LABS: Basic Metabolic Panel:  Recent Labs  08/21/14 0830 08/22/14 0246  NA 138 134*  K 4.0 4.6  CL 107 106  CO2 23 20  GLUCOSE 137* 122*  BUN 35* 33*  CREATININE 1.93* 1.94*  CALCIUM 8.2* 7.8*   Liver Function Tests:  Recent Labs  08/21/14 0830  AST 23  ALT 19  ALKPHOS 50  BILITOT 0.7  PROT 6.7  ALBUMIN 2.4*   No results for input(s): LIPASE, AMYLASE in the last 72 hours. CBC:  Recent Labs  08/21/14 0830 08/22/14 0246  WBC 17.7* 18.0*  NEUTROABS 14.7* 14.3*  HGB 10.5* 9.5*  HCT 32.2* 29.5*  MCV 96.7 97.7  PLT 404* 370   Cardiac Enzymes:  Recent Labs  08/20/14 2200 08/21/14 0129 08/21/14 0830  TROPONINI <0.03 <0.03 <0.03   BNP: Invalid input(s): POCBNP D-Dimer: No results for input(s): DDIMER in the last 72 hours. Hemoglobin A1C: No results for input(s): HGBA1C in the last 72 hours. Fasting Lipid Panel:  Recent Labs  08/21/14 0830  CHOL 102  HDL 32*    LDLCALC 57  TRIG 65  CHOLHDL 3.2   Thyroid Function Tests:  Recent Labs  08/20/14 2200  TSH 1.640   Anemia Panel: No results for input(s): VITAMINB12, FOLATE, FERRITIN, TIBC, IRON, RETICCTPCT in the last 72 hours. Coag Panel:   Lab Results  Component Value Date   INR 1.34 08/22/2014   INR 1.19 08/21/2014   INR 1.0 ratio 05/21/2009    RADIOLOGY: Dg Chest Port 1 View  08/20/2014   CLINICAL DATA:  Sternal chest pain, shortness of breath, weakness.  EXAM: PORTABLE CHEST - 1 VIEW  COMPARISON:  10/08/2011  FINDINGS: Studies AP lordotic in positioning. Heart and mediastinal contours are within normal limits. No focal opacities or effusions. No acute bony abnormality.  IMPRESSION: No active disease.   Electronically Signed   By: Rolm Baptise M.D.   On: 08/20/2014 12:40   Assessment/Plan: 81 yo male with subacute onset symptoms, including productive cough, dyspnea, chest pain and dizziness. He has low grade fever on arrival at 99.4. Labs indicate a leukocytosis at 19K. There is acute on chronic kidney disease with a creatinine of 1.9. BNP is elevated at 417. CXR is unremarkable.  I'm concerned about occult infection, this may be responsible for his atrial flutter. We will recommend anticoagulation with heparin (he is not like a good NOAC candidate due to his CKD) and rate control with IV diltiazem. He may ultimately need DCCV.  1. New onset atrial flutter with RVR, now rate controlled - CHA2DS2-Vasc score 4 (age, HTN, vascular disease) - 2D echo with EF 45-50% with mild AS, TSH is normal. Check orthostatic BP as some of his dizziness worse when stand up. - continue heparin gtt, not a good NOAC candidate with Cr 1.91,Loading with Coumadin.  S/P TEE/DCCV earlier this am - Stop cardizem gtt.  No BB for now with soft BP  2. Chest pain, persistent for the past 2 weeks - likely related to #1,  - negative trop X 3  after having persistent CP for 2 wks. Will consider outpt myoview if CP persists after DCCV  3. Leukocytosis - unclear cause, U/A negative, no sign of PNA on CXR.  - described coughing up thick grayish phlegm ? Bronchitis but lungs are clear  - Fever 99 on admit has currently resolved. - Received steroid injection several days ago which can elevate WBC  4. Chronic diastolic heart failure - fluid status stable, although describe having a cough, no rale on exam. Mild LE pitting edema.  - change HCTZ to Lasix 20mg  daily once BP improves  5. Hypertension- controlled to soft at 103/34mmHg 6. Hyperlipidemia 7. carotid artery stenosis: followed by Dr. Gwenlyn Found, stable, moderate 8. stage III chronic kidney disease 9. history of syncope 10.  Mild LV dysfunction EF 45-50% ? Secondary to tachcyardia   LOS: 1 day    Sueanne Margarita, MD  08/22/2014  1:21 PM

## 2014-08-22 NOTE — Anesthesia Procedure Notes (Signed)
Procedure Name: MAC Date/Time: 08/22/2014 12:25 PM Performed by: Carola Frost Pre-anesthesia Checklist: Patient identified, Timeout performed, Suction available, Patient being monitored and Emergency Drugs available Patient Re-evaluated:Patient Re-evaluated prior to inductionOxygen Delivery Method: Nasal cannula Intubation Type: IV induction Placement Confirmation: positive ETCO2 and breath sounds checked- equal and bilateral Dental Injury: Teeth and Oropharynx as per pre-operative assessment

## 2014-08-22 NOTE — Anesthesia Postprocedure Evaluation (Signed)
  Anesthesia Post-op Note  Patient: Mark Jackson.  Procedure(s) Performed: Procedure(s): TRANSESOPHAGEAL ECHOCARDIOGRAM (TEE) (N/A) CARDIOVERSION (N/A)  Patient Location: Endoscopy Unit  Anesthesia Type:MAC  Level of Consciousness: sedated  Airway and Oxygen Therapy: Patient Spontanous Breathing and Patient connected to nasal cannula oxygen  Post-op Pain: none  Post-op Assessment: Post-op Vital signs reviewed, Patient's Cardiovascular Status Stable, Respiratory Function Stable and Patent Airway  Post-op Vital Signs: Reviewed and stable  Last Vitals:  Filed Vitals:   08/22/14 1254  BP: 96/58  Pulse: 73  Temp:   Resp: 11    Complications: No apparent anesthesia complications

## 2014-08-23 ENCOUNTER — Encounter (HOSPITAL_COMMUNITY): Payer: Self-pay | Admitting: Cardiovascular Disease

## 2014-08-23 DIAGNOSIS — I5032 Chronic diastolic (congestive) heart failure: Secondary | ICD-10-CM

## 2014-08-23 DIAGNOSIS — I5189 Other ill-defined heart diseases: Secondary | ICD-10-CM | POA: Diagnosis present

## 2014-08-23 LAB — CBC
HEMATOCRIT: 30 % — AB (ref 39.0–52.0)
Hemoglobin: 9.7 g/dL — ABNORMAL LOW (ref 13.0–17.0)
MCH: 31.4 pg (ref 26.0–34.0)
MCHC: 32.3 g/dL (ref 30.0–36.0)
MCV: 97.1 fL (ref 78.0–100.0)
PLATELETS: 430 10*3/uL — AB (ref 150–400)
RBC: 3.09 MIL/uL — AB (ref 4.22–5.81)
RDW: 12.8 % (ref 11.5–15.5)
WBC: 15.1 10*3/uL — ABNORMAL HIGH (ref 4.0–10.5)

## 2014-08-23 LAB — HEPARIN LEVEL (UNFRACTIONATED): HEPARIN UNFRACTIONATED: 0.4 [IU]/mL (ref 0.30–0.70)

## 2014-08-23 LAB — PROTIME-INR
INR: 1.37 (ref 0.00–1.49)
Prothrombin Time: 17 seconds — ABNORMAL HIGH (ref 11.6–15.2)

## 2014-08-23 MED ORDER — ENOXAPARIN SODIUM 100 MG/ML ~~LOC~~ SOLN
90.0000 mg | SUBCUTANEOUS | Status: DC
  Administered 2014-08-23: 90 mg via SUBCUTANEOUS
  Filled 2014-08-23: qty 1

## 2014-08-23 MED ORDER — METOPROLOL SUCCINATE ER 25 MG PO TB24
25.0000 mg | ORAL_TABLET | Freq: Every day | ORAL | Status: DC
  Administered 2014-08-23: 25 mg via ORAL
  Filled 2014-08-23: qty 1

## 2014-08-23 MED ORDER — WARFARIN SODIUM 5 MG PO TABS
5.0000 mg | ORAL_TABLET | Freq: Once | ORAL | Status: AC
  Administered 2014-08-23: 5 mg via ORAL
  Filled 2014-08-23: qty 1

## 2014-08-23 NOTE — Care Management Note (Addendum)
    Page 1 of 1   08/24/2014     10:23:13 AM CARE MANAGEMENT NOTE 08/24/2014  Patient:  Mark Jackson, Mark Jackson   Account Number:  [id number]  Date Initiated:  08/23/2014  Documentation initiated by:  GRAVES-BIGELOW,Chay Mazzoni  Subjective/Objective Assessment:   Pt admitted for afib. Plan for home on coumadin lovenox bridge. Pt is from home wife and pt will be taught how to do the injections. No HH services needed at this time.     Action/Plan:   Pt uses [employer] and [employer] are available. Benefits check in process and will make pt aware once completed.  Insurance stating that plan has expired, pt and family checking into this.   Anticipated DC Date:  08/24/2014   Anticipated DC Plan:  [place]  CM consult  Medication Assistance      Choice offered to / List presented to:             Status of service:  Completed, signed off Medicare Important Message given?  YES (If response is "NO", the following Medicare IM given date fields will be blank) Date Medicare IM given:  08/23/2014 Medicare IM given by:  GRAVES-BIGELOW,Rheanna Sergent Date Additional Medicare IM given:   Additional Medicare IM given by:    Discharge Disposition:  HOME/SELF CARE  Per UR Regulation:  Reviewed for med. necessity/level of care/duration of stay  If discussed at [place] of Stay Meetings, dates discussed:    Comments:  08-24-14 [place], RN, BSN [phone] CM did call [facility] and lovenox is available and co pay will be 6.00 per Pharmacist. No further needs from CM at this time.  08/23/2014 Spoke with pt and family as our CMA were told that his insurance has expired. This pt had arranged for an automatic renewal with automatic band draft. The pt and family are checking with the insurance company and with the pt pharmacy, [facility]. If needed we will ask the MD to prepare a prescription and fax to [facility] so that they can fill the  prescription and run thru the pt insurance provider to determine if the medication benefit is in effect. Will followup with pt . If needed this pt can purchase the Lovenox, he is able and willing to learn to adm the drug himself. CRoyal RN MPH, case manager, [phone]

## 2014-08-23 NOTE — Care Management Note (Signed)
CARE MANAGEMENT NOTE 08/23/2014  Patient:  Mark Jackson, Mark Jackson   Account Number:  [id number]  Date Initiated:  08/23/2014  Documentation initiated by:  GRAVES-BIGELOW,BRENDA  Subjective/Objective Assessment:   Pt admitted for afib. Plan for home on coumadin lovenox bridge. Pt is from home wife and pt will be taught how to do the injections. No HH services needed at this time.     Action/Plan:   Pt uses [employer] and [employer] are available. Benefits check in process and will make pt aware once completed.  Insurance stating that plan has expired, pt and family checking into this.   Anticipated DC Date:  08/24/2014   Anticipated DC Plan:  [place]  CM consult  Medication Assistance      Choice offered to / List presented to:             Status of service:  Completed, signed off Medicare Important Message given?  YES (If response is "NO", the following Medicare IM given date fields will be blank) Date Medicare IM given:  08/23/2014 Medicare IM given by:  GRAVES-BIGELOW,BRENDA Date Additional Medicare IM given:   Additional Medicare IM given by:    Discharge Disposition:  HOME/SELF CARE  Per UR Regulation:  Reviewed for med. necessity/level of care/duration of stay  If discussed at [employer] of Stay Meetings, dates discussed:    Comments:  08/23/2014 Spoke with pt and family as our CMA were told that his insurance has expired. This pt had arranged for an automatic renewal with automatic band draft. The pt and family are checking with the insurance company and with the pt pharmacy, [facility]. If needed we will ask the MD to prepare a prescription and fax to [facility] so that they can fill the prescription and run thru the pt insurance provider to determine if the medication benefit is in effect. Will followup with pt . If needed this pt can purchase the Lovenox, he is able and willing to learn to adm the drug himself. CRoyal RN MPH, case  manager, [phone]

## 2014-08-23 NOTE — Progress Notes (Addendum)
Subjective: No chest pain like he had on admit, only the discomflrt of the cardioversion.   Objective: Vital signs in last 24 hours: Temp:  [97.3 F (36.3 C)-98.5 F (36.9 C)] 98.2 F (36.8 C) (01/14 0535) Pulse Rate:  [68-93] 77 (01/14 0535) Resp:  [11-19] 18 (01/14 0535) BP: (96-138)/(50-82) 128/65 mmHg (01/14 0535) SpO2:  [94 %-100 %] 97 % (01/14 0535) Weight:  [201 lb 11.2 oz (91.491 kg)] 201 lb 11.2 oz (91.491 kg) (01/14 0535) Weight change: 6 lb 11.2 oz (3.039 kg) Last BM Date: 08/21/14 Intake/Output from previous day: -851 01/13 0701 - 01/14 0700 In: 249 [P.O.:240; I.V.:9] Out: 1100 [Urine:1100] Intake/Output this shift:    PE: General:Pleasant affect, NAD, sitting on the side of the bed. Skin:Warm and dry, brisk capillary refill HEENT:normocephalic, sclera clear, mucus membranes moist Heart:S1S2 RRR without murmur, gallup, rub or click Lungs:clear without rales, rhonchi, or wheezes [id number], non tender, + BS, do not palpate liver spleen or masses Ext:no lower ext edema, 1+ pedal pulses, 2+ radial pulses Neuro:alert and oriented X 3, MAE, follows commands, + facial symmetry  tele:  SR Lab Results:  Recent Labs  08/22/14 0246 08/23/14 0800  WBC 18.0* 15.1*  HGB 9.5* 9.7*  HCT 29.5* 30.0*  PLT 370 430*   BMET  Recent Labs  08/21/14 0830 08/22/14 0246  NA 138 134*  K 4.0 4.6  CL 107 106  CO2 23 20  GLUCOSE 137* 122*  BUN 35* 33*  CREATININE 1.93* 1.94*  CALCIUM 8.2* 7.8*    Recent Labs  08/21/14 0129 08/21/14 0830  TROPONINI <0.03 <0.03    Lab Results  Component Value Date   CHOL 102 08/21/2014   HDL 32* 08/21/2014   LDLCALC 57 08/21/2014   TRIG 65 08/21/2014   CHOLHDL 3.2 08/21/2014   Lab Results  Component Value Date   HGBA1C 6.0* 01/12/2014     Lab Results  Component Value Date   TSH 1.640 08/20/2014    Hepatic Function Panel  Recent Labs  08/21/14 0830  PROT 6.7  ALBUMIN 2.4*  AST 23  ALT 19  ALKPHOS 50   BILITOT 0.7    Recent Labs  08/21/14 0830  CHOL 102   No results for input(s): PROTIME in the last 72 hours.     Studies/Results: No results found.  Medications: I have reviewed the patient's current medications. Scheduled Meds: . ferrous sulfate  325 mg Oral Q breakfast  . rosuvastatin  5 mg Oral q1800  . sodium chloride  3 mL Intravenous Q12H  . sodium chloride  3 mL Intravenous Q12H  . topiramate  100 mg Oral QHS  . [MAR Hold] warfarin   Does not apply Once  . Warfarin - Pharmacist Dosing Inpatient   Does not apply q1800   Continuous Infusions: . sodium chloride    . sodium chloride    . heparin 1,450 Units/hr (08/23/14 0006)   PRN Meds:.acetaminophen **OR** acetaminophen, HYDROcodone-homatropine, ondansetron **OR** ondansetron (ZOFRAN) IV, polyethylene glycol, sodium chloride  Assessment/Plan: 81 yo male with subacute onset symptoms, including productive cough, dyspnea, chest pain and dizziness. He has low grade fever on arrival at 99.4. Labs indicate a leukocytosis at 19K. There is acute on chronic kidney disease with a creatinine of 1.9. BNP is elevated at 417. CXR is unremarkable. I'm concerned about occult infection, this may be responsible for his atrial flutter. We will recommend anticoagulation with heparin (he is not like a good NOAC  candidate due to his CKD) and rate control with IV diltiazem. He may ultimately need DCCV.  1. New onset atrial flutter with RVR, now s/p TEE/DCCV maintaining SR - CHA2DS2-Vasc score 4 (age, HTN, vascular disease) - 2D echo with EF 45-50% with mild AS, TSH is normal.  - continue heparin gtt, not a good NOAC candidate with Cr 1.91,Loading with Coumadin.INR today 1.37   add low dose BB - not started yesterday due to BP now 138-128/72   2. Chest pain, persistent for the past 2 weeks - likely related to #1,  - negative trop X 3 after having  persistent CP for 2 wks. No further chest pain. Now only post cardioversion discomfort.  ?outpt myoview if CP persists after DCCV  3. Leukocytosis - unclear cause, U/A negative, no sign of PNA on CXR.  - described coughing up thick grayish phlegm ? Bronchitis but lungs are clear  - Fever 99 on admit has currently resolved. - Received steroid injection several days ago which can elevate WBC  4. Acute diastolic heart failure secondary to rapid aflutter - fluid status stable, although describe having a cough, no rale on exam. Mild LE pitting edema.   - he has underlying renal insuff.  He is on HCTZ for LE edema.  Will leave diuretics up to IM.  His acute exacerbation was most likely due to aflutter with RVR and appears resolved.   5. Hypertension- controlled yesterday at 103/68mmHg now [id number] systolic 6. Hyperlipidemia-LDL 57 on crestor 5 mg. 7. carotid artery stenosis: followed by Dr. Gwenlyn Found, stable, moderate 8. stage III chronic kidney disease 9. history of syncope 10. Mild LV dysfunction EF 45-50% ? Secondary to tachcyardia 11.  Anemia, with decrease of H/H 10.9/33 on admit, now 9.7/30     LOS: 3 days   Time spent with pt. :15 minutes. [facility] R  Nurse Practitioner Certified Pager [phone] or after 5pm and on weekends call [phone] 08/23/2014, 8:46 AM  I have personally seen and examined patient and agree with note as outlined by Cecilie Kicks NP.  The patient is now s/p TEE/DCCV.  BB was ordered yesterday but not given due to soft BP.  BP stable this am so will start Toprol XL 25mg  daily.  His CP has resolved after DCCV.  INR slowly trending up on IV Heparin gtt and Coumadin.  Will get case manager to see if insurance will pay for home Lovenox while loading Coumadin.    Signed: Fransico Him, MD [facility] 08/23/2014

## 2014-08-23 NOTE — Progress Notes (Signed)
Physical Therapy Treatment Patient Details Name: Mark Jackson. MRN: [id number] DOB: [birth year] Today's Date: 08/23/2014    History of Present Illness Patient is a 81 y/o male with PMH of HTN, HLD, prostate cancer, right carotid stenosis, CKD stage III, diastolic CHF who presents with two-week history of persistent chest discomfort with associated SOB. Went to urgent care and found to be in A-flutter with RVR. s/p TEE/DCCV 1/13.    PT Comments    Pt able to ambulate from room all the way down to the other end of the unit and back without SOB. Pt states he feels that his walking pace is still not at a level similar to when he was admitted. Pt continuing to refuse using RW for ambulation and appeared to walk better today without the support. Pt RHR 95 bpm and increased to 100bpm NSR during ambulation. Pt will continue to benefit from acute PT to focus on safety with mobility and general strengthening.    Follow Up Recommendations  Supervision/Assistance - 24 hour     Equipment Recommendations  None recommended by PT    Recommendations for Other Services OT consult     Precautions / Restrictions Precautions Precautions: Fall Restrictions Weight Bearing Restrictions: No    Mobility  Bed Mobility                  Transfers Overall transfer level: Needs assistance Equipment used: None Transfers: Sit to/from Stand Sit to Stand: Supervision         General transfer comment: Pt able to safely stand from lowered bed without assistance or AD. Pt without loss of balance or instability.   Ambulation/Gait Ambulation/Gait assistance: Min guard Ambulation Distance (Feet): 400 Feet Assistive device: None Gait Pattern/deviations: Step-through pattern;Decreased step length - right;Decreased step length - left;Decreased stride length   Gait velocity interpretation: Below normal speed for age/gender General Gait Details: Pt with slow gait with decreased stride length. Pt  able to ambulate with AD and without holding unto railings in hallway, however, with tendency to walk close to walls and not in middle of hall. Pt without loss of balance or instability. RHR 95bpm and increased to 100bpm during ambulation.     Stairs            Wheelchair Mobility    Modified Rankin (Stroke Patients Only)       Balance           Standing balance support: During functional activity;No upper extremity supported Standing balance-Leahy Scale: Fair Standing balance comment: Pt able to ambulate without support but would benefit from support for safety with challenges.                     Cognition Arousal/Alertness: Awake/alert Behavior During Therapy: WFL for tasks assessed/performed Overall Cognitive Status: Within Functional Limits for tasks assessed                      Exercises  Pt refused exercises today.      General Comments        Pertinent Vitals/Pain Pain Assessment: No/denies pain    Home Living                      Prior Function            PT Goals (current goals can now be found in the care plan section) Progress towards PT goals: Progressing toward goals    Frequency  Min 3X/week    PT Plan Current plan remains appropriate    Co-evaluation             End of Session Equipment Utilized During Treatment: Gait belt Activity Tolerance: Patient tolerated treatment well Patient left: in bed;with call bell/phone within reach     Time: 1123-1138 PT Time Calculation (min) (ACUTE ONLY): 15 min  Charges:  $Gait Training: 8-22 mins                    G CodesJearld Shines SPT 08/23/2014, 11:45 AM  Jearld Shines, SPT  Acute Rehabilitation [phone]

## 2014-08-23 NOTE — Progress Notes (Signed)
PROGRESS NOTE  Mark Jackson. [id number] DOB: 12-Dec-1929 DOA: 08/20/2014 PCP: Jenny Reichmann, MD  Assessment/Plan: Atrial flutter with RVR S/p cardioversion -TSH ok BB per cardiology. Change to lovenox in hopes of home lovenox/coumadin bridge.  EF 45%  CKD stage III D/c ivf  Leukocytosis S/p ESI. No signs of infection  Chest pain troponins ok  Lower extremity pain and edema -DVT negative  Hyperlipidemia -continue statin -LDL: 57  Code Status: full Family Communication: multiple at bedside Disposition Plan: home in am if stable and ok with cardiology   Consultants:  cardiology  Procedures:      HPI/Subjective: Wants to go home  Objective: Filed Vitals:   08/23/14 0535  BP: 128/65  Pulse: 77  Temp: 98.2 F (36.8 C)  Resp: 18    Intake/Output Summary (Last 24 hours) at 08/23/14 1418 Last data filed at 08/23/14 0900  Gross per 24 hour  Intake    483 ml  Output    500 ml  Net    -17 ml   Filed Weights   08/21/14 0400 08/22/14 0500 08/23/14 0535  Weight: 88.3 kg (194 lb 10.7 oz) 88.451 kg (195 lb) 91.491 kg (201 lb 11.2 oz)    Exam:   General:  Pleasant/cooperative  Cardiovascular: RRR without MGR  Respiratory: clear without WRR  Abdomen: +BS, soft, NT, ND  Musculoskeletal: trace ankle edema  Data Reviewed: Basic Metabolic Panel:  Recent Labs Lab 08/20/14 1138 08/21/14 0830 08/22/14 0246  NA 136 138 134*  K 4.5 4.0 4.6  CL 107 107 106  CO2 23 23 20   GLUCOSE 105* 137* 122*  BUN 40* 35* 33*  CREATININE 1.91* 1.93* 1.94*  CALCIUM 8.5 8.2* 7.8*   Liver Function Tests:  Recent Labs Lab 08/21/14 0830  AST 23  ALT 19  ALKPHOS 50  BILITOT 0.7  PROT 6.7  ALBUMIN 2.4*   No results for input(s): LIPASE, AMYLASE in the last 168 hours. No results for input(s): AMMONIA in the last 168 hours. CBC:  Recent Labs Lab 08/20/14 1138 08/21/14 0830 08/22/14 0246 08/23/14 0800  WBC 19.5* 17.7* 18.0* 15.1*  NEUTROABS  --   14.7* 14.3*  --   HGB 10.9* 10.5* 9.5* 9.7*  HCT 33.1* 32.2* 29.5* 30.0*  MCV 95.9 96.7 97.7 97.1  PLT 391 404* 370 430*   Cardiac Enzymes:  Recent Labs Lab 08/20/14 2200 08/21/14 0129 08/21/14 0830  TROPONINI <0.03 <0.03 <0.03   BNP (last 3 results) No results for input(s): PROBNP in the last 8760 hours. CBG: No results for input(s): GLUCAP in the last 168 hours.  Recent Results (from the past 240 hour(s))  Culture, blood (routine x 2)     Status: None (Preliminary result)   Collection Time: 08/20/14 10:00 PM  Result Value Ref Range Status   Specimen Description BLOOD LEFT ARM  Final   Special Requests BOTTLES DRAWN AEROBIC AND ANAEROBIC 10CC  Final   Culture   Final           BLOOD CULTURE RECEIVED NO GROWTH TO DATE CULTURE WILL BE HELD FOR 5 DAYS BEFORE ISSUING A FINAL NEGATIVE REPORT Performed at [facility]    Report Status PENDING  Incomplete  Culture, blood (routine x 2)     Status: None (Preliminary result)   Collection Time: 08/20/14 10:10 PM  Result Value Ref Range Status   Specimen Description BLOOD LEFT HAND  Final   Special Requests BOTTLES DRAWN AEROBIC AND ANAEROBIC 10CC  Final  Culture   Final           BLOOD CULTURE RECEIVED NO GROWTH TO DATE CULTURE WILL BE HELD FOR 5 DAYS BEFORE ISSUING A FINAL NEGATIVE REPORT Performed at [facility]    Report Status PENDING  Incomplete     Studies: No results found.  Scheduled Meds: . ferrous sulfate  325 mg Oral Q breakfast  . metoprolol succinate  25 mg Oral Daily  . rosuvastatin  5 mg Oral q1800  . sodium chloride  3 mL Intravenous Q12H  . sodium chloride  3 mL Intravenous Q12H  . topiramate  100 mg Oral QHS  . warfarin  5 mg Oral ONCE-1800  . [MAR Hold] warfarin   Does not apply Once  . Warfarin - Pharmacist Dosing Inpatient   Does not apply q1800   Continuous Infusions: . sodium chloride     Antibiotics Given (last 72 hours)    None      Time spent: 25  min  [facility] [email or web address], password [facility] 08/23/2014, 2:18 PM  LOS: 3 days

## 2014-08-23 NOTE — Progress Notes (Addendum)
ANTICOAGULATION CONSULT NOTE - Follow Up Consult  Pharmacy Consult for heparin and warfarin Indication: atrial fibrillation  Allergies  Allergen Reactions  . Plavix [Clopidogrel Bisulfate]     Significant bruising    Patient Measurements: Height: 5\' 8"  (172.7 cm) Weight: 201 lb 11.2 oz (91.491 kg) IBW/kg (Calculated) : 68.4  Vital Signs: Temp: 98.2 F (36.8 C) (01/14 0535) Temp Source: Oral (01/14 0535) BP: 128/65 mmHg (01/14 0535) Pulse Rate: 77 (01/14 0535)  Labs:  Recent Labs  08/20/14 1138 08/20/14 2200 08/21/14 0129 08/21/14 0830  08/21/14 1328 08/21/14 1936 08/22/14 0246 08/23/14 0757 08/23/14 0800  HGB 10.9*  --   --  10.5*  --   --   --  9.5*  --  9.7*  HCT 33.1*  --   --  32.2*  --   --   --  29.5*  --  30.0*  PLT 391  --   --  404*  --   --   --  370  --  430*  APTT  --   --   --   --   --   --   --  97*  --   --   LABPROT  --   --   --   --   --  15.2  --  16.8*  --  17.0*  INR  --   --   --   --   --  1.19  --  1.34  --  1.37  HEPARINUNFRC  --   --  0.30  --   < >  --  0.37 0.30 0.40  --   CREATININE 1.91*  --   --  1.93*  --   --   --  1.94*  --   --   TROPONINI  --  <0.03 <0.03 <0.03  --   --   --   --   --   --   < > = values in this interval not displayed.  Estimated Creatinine Clearance: 32.2 mL/min (by C-G formula based on Cr of 1.94).   Medications:  . sodium chloride    . sodium chloride    . heparin 1,450 Units/hr (08/23/14 0006)    Assessment: 81 yo male with AFib, on heparin to bridge warfarin. He is s/p DCCV/TEE yesterday. Heparin level is therapeutic this morning at 0.4. INR is subtherapeutic at 1.37. No bleeding noted, CBC is stable.  Goal of Therapy:  INR 2-3 Heparin level 0.3-0.7 units/ml Monitor platelets by anticoagulation protocol: Yes   Plan:  Continue heparin drip at 1450 units/hr Warfarin 5 mg PO tonight Daily heparin level, CBC, and INR Monitor for s/sx of bleeding  [facility], [place].D., BCPS Clinical  Pharmacist Pager: [phone] 08/23/2014 11:12 AM   Addendum: Transitioning to Lovenox. CrCl ~30 ml/min so will proceed with once daily dosing.  Give Lovenox 90 mg SQ q24h - to begin 1 hr after IV heparin turned off. Spoke with RN about plan.  [place], Pharm.D., BCPS Clinical Pharmacist Pager: [phone] 08/23/2014 2:33 PM

## 2014-08-24 LAB — BASIC METABOLIC PANEL
ANION GAP: 4 — AB (ref 5–15)
BUN: 31 mg/dL — AB (ref 6–23)
CHLORIDE: 111 meq/L (ref 96–112)
CO2: 24 mmol/L (ref 19–32)
Calcium: 8.2 mg/dL — ABNORMAL LOW (ref 8.4–10.5)
Creatinine, Ser: 1.86 mg/dL — ABNORMAL HIGH (ref 0.50–1.35)
GFR calc non Af Amer: 32 mL/min — ABNORMAL LOW (ref >90)
GFR, EST AFRICAN AMERICAN: 37 mL/min — AB (ref >90)
Glucose, Bld: 101 mg/dL — ABNORMAL HIGH (ref 70–99)
Potassium: 4.1 mmol/L (ref 3.5–5.1)
SODIUM: 139 mmol/L (ref 135–145)

## 2014-08-24 LAB — PROTIME-INR
INR: 1.52 — ABNORMAL HIGH (ref 0.00–1.49)
Prothrombin Time: 18.4 s — ABNORMAL HIGH (ref 11.6–15.2)

## 2014-08-24 LAB — CBC
HCT: 30.6 % — ABNORMAL LOW (ref 39.0–52.0)
Hemoglobin: 10.1 g/dL — ABNORMAL LOW (ref 13.0–17.0)
MCH: 32.3 pg (ref 26.0–34.0)
MCHC: 33 g/dL (ref 30.0–36.0)
MCV: 97.8 fL (ref 78.0–100.0)
Platelets: 427 K/uL — ABNORMAL HIGH (ref 150–400)
RBC: 3.13 MIL/uL — ABNORMAL LOW (ref 4.22–5.81)
RDW: 12.5 % (ref 11.5–15.5)
WBC: 13.4 K/uL — ABNORMAL HIGH (ref 4.0–10.5)

## 2014-08-24 MED ORDER — WARFARIN SODIUM 7.5 MG PO TABS
7.5000 mg | ORAL_TABLET | Freq: Once | ORAL | Status: AC
  Administered 2014-08-24: 7.5 mg via ORAL
  Filled 2014-08-24: qty 1

## 2014-08-24 MED ORDER — METOPROLOL SUCCINATE ER 50 MG PO TB24: 50.0000 mg | ORAL_TABLET | Freq: Every day | ORAL | Status: DC

## 2014-08-24 MED ORDER — ENOXAPARIN SODIUM 100 MG/ML ~~LOC~~ SOLN: 90.0000 mg | SUBCUTANEOUS | Status: DC

## 2014-08-24 MED ORDER — WARFARIN SODIUM 7.5 MG PO TABS: 7.5000 mg | ORAL_TABLET | Freq: Once | ORAL | Status: DC

## 2014-08-24 MED ORDER — ENOXAPARIN (LOVENOX) PATIENT EDUCATION KIT
PACK | Freq: Once | Status: AC
  Administered 2014-08-24: 12:00:00
  Filled 2014-08-24: qty 1

## 2014-08-24 MED ORDER — ENOXAPARIN SODIUM 100 MG/ML ~~LOC~~ SOLN
90.0000 mg | SUBCUTANEOUS | Status: DC
  Administered 2014-08-24: 90 mg via SUBCUTANEOUS
  Filled 2014-08-24: qty 1

## 2014-08-24 MED ORDER — WARFARIN SODIUM 5 MG PO TABS: 5.0000 mg | ORAL_TABLET | Freq: Once | ORAL | Status: DC

## 2014-08-24 MED ORDER — METOPROLOL SUCCINATE ER 50 MG PO TB24
50.0000 mg | ORAL_TABLET | Freq: Every day | ORAL | Status: DC
  Administered 2014-08-24: 50 mg via ORAL
  Filled 2014-08-24: qty 1

## 2014-08-24 NOTE — Progress Notes (Addendum)
ANTICOAGULATION CONSULT NOTE - Follow Up Consult  Pharmacy Consult for Lovenox and warfarin Indication: atrial fibrillation  Allergies  Allergen Reactions  . Plavix [Clopidogrel Bisulfate]     Significant bruising    Patient Measurements: Height: 5\' 8"  (172.7 cm) Weight: 197 lb 11.2 oz (89.676 kg) IBW/kg (Calculated) : 68.4  Vital Signs: Temp: 99 F (37.2 C) (01/15 0500) BP: 130/60 mmHg (01/15 0500) Pulse Rate: 82 (01/15 0500)  Labs:  Recent Labs  08/21/14 1936  08/22/14 0246 08/23/14 0757 08/23/14 0800 08/24/14 0540  HGB  --   < > 9.5*  --  9.7* 10.1*  HCT  --   --  29.5*  --  30.0* 30.6*  PLT  --   --  370  --  430* 427*  APTT  --   --  97*  --   --   --   LABPROT  --   --  16.8*  --  17.0* 18.4*  INR  --   --  1.34  --  1.37 1.52*  HEPARINUNFRC 0.37  --  0.30 0.40  --   --   CREATININE  --   --  1.94*  --   --  1.86*  < > = values in this interval not displayed.  Estimated Creatinine Clearance: 33.3 mL/min (by C-G formula based on Cr of 1.86).   Medications:  . sodium chloride      Assessment: 81 yo male with AFib, on Lovenox to bridge warfarin. He is s/p DCCV/TEE on 1/13. INR is subtherapeutic at 1.52 but trending up. No bleeding noted, CBC is stable.  Goal of Therapy:  INR 2-3 Anti-Xa level 0.6-1 units/ml 4hrs after LMWH dose given Monitor platelets by anticoagulation protocol: Yes   Plan:  Lovenox 90 mg SQ q24h Warfarin 7.5 mg PO tonight; upon discharge would consider 5 mg daily Daily INR Monitor for s/sx of bleeding  [facility], [place].D., BCPS Clinical Pharmacist Pager: [phone] 08/24/2014 9:51 AM

## 2014-08-24 NOTE — Discharge Summary (Cosign Needed)
Physician Discharge Summary  Mark Jackson. [id number] DOB: 1929/08/18 DOA: 08/20/2014  PCP: Jenny Reichmann, MD  Admit date: 08/20/2014 Discharge date: 08/24/2014  Time spent: greater than 30 minutes  Recommendations for Outpatient Follow-up:  1. INR check next week 2. Consider outpatient stress test  Discharge Diagnoses:  Principal Problem:   Atrial fibrillation with rapid ventricular response Active Problems:   Essential hypertension   Carotid artery disease   Hyperlipidemia   Leukocytosis   Atrial fibrillation, new onset   Atrial flutter   CKD (chronic kidney disease) stage 3, GFR 30-59 ml/min   Acute combined systolic and diastolic heart failure   Discharge Condition: stable  Filed Weights   08/22/14 0500 08/23/14 0535 08/24/14 0500  Weight: 88.451 kg (195 lb) 91.491 kg (201 lb 11.2 oz) 89.676 kg (197 lb 11.2 oz)    History of present illness:  81 year old male with a history of hypertension, hyperlipidemia, prostate cancer, right carotid stenosis, CKD stage III, diastolic CHF presented with two-week history of persistent chest discomfort with associated shortness of breath. The patient states that he was on a trip to [place] on 08/06/2014 when he began experiencing chest discomfort that would radiate to his back as well as to his neck. Since that period of time he has been "feeling bad". He denies any fevers, chills, nausea, vomiting, diarrhea. But he continued to have persistent chest discomfort with fatigue and dyspnea on exertion. On the day of admission, the patient got even more fatigued and had a near syncopal episode. As a result, the patient went to urgent care where an EKG found the patient to have atrial flutter. He was sent to the emergency department for further evaluation. The patient complains of cough but denies any hemoptysis. He denies any abdominal pain, dysuria, hematuria, rashes, synovitis, hematochezia, melena. Notably, the patient states that he  had an "epidural injection" one week ago for back pain.  In the emergency department, the patient had an EKG that showed atrial flutter. The patient was seen by cardiology. He was started on a diltiazem drip and heparin drip. Blood work revealed serum creatinine 1.91, WBC 19.5. Urinalysis was negative for any pyuria. BNP was 416. Point-of-care troponin was unremarkable.  Hospital Course:  New onset atrial flutter with RVR, now s/p TEE/DCCV maintaining SR but with several short bursts of SVT.  - CHA2DS2-Vasc score 4 (age, HTN, vascular disease) - 2D echo with EF 45-50% with mild AS, TSH is normal.  - not a good NOAC candidate with Cr 1.91.INR today 1.52 on coumadin and lovenox.  Will go home with lovenox and coumadin bridge. discharge on toprol XL  TSH WNL   2. Chest pain, persistent for the past 2 weeks - likely related to #1,  - negative trop X 3 after having persistent CP for 2 wks. No further chest pain. Now only with what he calls post cardioversion discomfort.He says it is hard to describe and only occurs when lying flat. He denies any chest discomfort with moving around in the room, sitting up or walking in the halls. This does not sound cardiac. cardiology recommends Dr. Ellyn Hack consider outpatient stress test.  3. Leukocytosis CXR and UA negative. Received steroid injection several days prior to admission.  4. Acute diastolic heart failure secondary to rapid aflutter - fluid status stable, although describe having a cough, no rale on exam. Mild LE pitting edema.  - he has underlying renal insuff. He is on HCTZ for LE edema. Will leave diuretics up  to IM. His acute exacerbation was most likely due to aflutter with RVR and appears resolved.  5. Hypertension- controlled - continue BB 6. Hyperlipidemia-LDL 57 on crestor 5 mg. 7. carotid artery stenosis: followed by Dr.  Gwenlyn Found, stable, moderate 8. stage III chronic kidney disease 9. history of syncope 10. Mild LV dysfunction EF 45-50% ? Secondary to tachycardia. Repeat echo in a few months to see if resolved. 11. Anemia, with decrease of H/H 10.9/33 on admit, now 10.1/30.6  Lower extremity pain and edema -DVT negative  Consultants:  cardiology  Procedures:  TEE/DCCV  Discharge Exam: Filed Vitals:   08/24/14 1004  BP: 124/66  Pulse: 78  Temp:   Resp:     General: comfortable Cardiovascular: RRR Respiratory: CTA  Discharge Instructions   Discharge Instructions    Activity as tolerated - No restrictions    Complete by:  As directed      Diet - low sodium heart healthy    Complete by:  As directed           Current Discharge Medication List    START taking these medications   Details  enoxaparin (LOVENOX) 100 MG/ML injection Inject 0.9 mLs (90 mg total) into the skin daily. Qty: 3 Syringe, Refills: 1    metoprolol succinate (TOPROL-XL) 50 MG 24 hr tablet Take 1 tablet (50 mg total) by mouth daily. Take with or immediately following a meal. Qty: 30 tablet, Refills: 1    warfarin (COUMADIN) 5 MG tablet Take 1 tablet (5 mg total) by mouth one time only at 6 PM. Or as directed Qty: 30 tablet, Refills: 0      CONTINUE these medications which have NOT CHANGED   Details  acetaminophen (TYLENOL) 325 MG tablet Take 325-650 mg by mouth every 6 (six) hours as needed for headache.     aspirin 81 MG tablet Take 81 mg by mouth 2 (two) times daily.     CRESTOR 5 MG tablet TAKE 1 TABLET ONCE DAILY. Qty: 90 tablet, Refills: 3    ferrous sulfate 325 (65 FE) MG tablet Take 325 mg by mouth daily with breakfast.    hydrochlorothiazide (MICROZIDE) 12.5 MG capsule Take 1 capsule (12.5 mg total) by mouth daily. Qty: 30 capsule, Refills: 11    HYDROcodone-homatropine (HYCODAN) 5-1.5 MG/5ML syrup Take 5 mLs by mouth every 8 (eight) hours as needed for cough. Qty: 120 mL, Refills: 0    Associated Diagnoses: Cough    LOPERAMIDE HCL PO Take 2-3 tablets by mouth every 6 (six) hours as needed (diarrhea).    Multiple Vitamins-Minerals (MULTIVITAMIN WITH MINERALS) tablet Take 1 tablet by mouth daily.    Tetrahydrozoline HCl (VISINE OP) Apply 1 drop to eye 2 (two) times daily as needed (burning of eyes).    !! topiramate (TOPAMAX) 100 MG tablet Take 100 mg by mouth at bedtime.    baclofen (LIORESAL) 10 MG tablet Take 10 mg by mouth daily as needed (for headache).     !! topiramate (TOPAMAX) 25 MG tablet Take 25 mg by mouth 2 (two) times daily.     !! - Potential duplicate medications found. Please discuss with provider.    STOP taking these medications     Cyanocobalamin (VITAMIN B-12 IJ)      ibuprofen (ADVIL,MOTRIN) 200 MG tablet      losartan (COZAAR) 50 MG tablet      Oxymetazoline HCl (SUDAFED OM SINUS COLD NA)      amoxicillin (AMOXIL) 875 MG tablet  Allergies  Allergen Reactions  . Plavix [Clopidogrel Bisulfate]     Significant bruising   Follow-up Information    Follow up with [facility] On 08/27/2014.   Why:  at    1040 AM                      with Erasmo Downer at Dr. Darcus Pester Office on [address]      Follow up with [name], MD On 08/29/2014.   Specialty:  Cardiology   Why:  with Dr. Ellyn Hack 1:45 PM   Contact information:   [address] [place] [address] [phone]        The results of significant diagnostics from this hospitalization (including imaging, microbiology, ancillary and laboratory) are listed below for reference.    Significant Diagnostic Studies: Dg Chest Port 1 View  08/20/2014   CLINICAL DATA:  Sternal chest pain, shortness of breath, weakness.  EXAM: PORTABLE CHEST - 1 VIEW  COMPARISON:  10/08/2011  FINDINGS: Studies AP lordotic in positioning. Heart and mediastinal contours are within normal limits. No focal opacities or effusions. No acute bony abnormality.  IMPRESSION: No active  disease.   Electronically Signed   By: Rolm Baptise M.D.   On: 08/20/2014 12:40   Echo Left ventricle: The cavity size was normal. There was mild concentric hypertrophy. Systolic function was mildly reduced. The estimated ejection fraction was in the range of 45% to 50%. Diffuse hypokinesis. The study was not technically sufficient to allow evaluation of LV diastolic dysfunction due to atrial fibrillation. - Aortic valve: Trileaflet; moderately thickened, moderately calcified leaflets. There was mild stenosis. Mean gradient (S): 16 mm Hg. Peak gradient (S): 28 mm Hg. Valve area (VTI): 1.2 cm^2. Valve area (Vmax): 1.12 cm^2. Valve area (Vmean): 1.26 cm^2. - Aortic root: The aortic root was normal in size. - Mitral valve: Structurally normal valve. There was no regurgitation. - Left atrium: The atrium was normal in size. - Right ventricle: Systolic function was normal. - Tricuspid valve: There was mild regurgitation. - Pulmonic valve: Structurally normal valve. - Pulmonary arteries: Systolic pressure was within the normal range. - Inferior vena cava: The vessel was normal in size. - Pericardium, extracardiac: A trivial pericardial effusion was identified. Features were not consistent with tamponade physiology.  Impressions:  - Compared to the study from 10/21/2012, the LVEF is now mildly diffusely reduced estimated at 45-50%. Microbiology: Recent Results (from the past 240 hour(s))  Culture, blood (routine x 2)     Status: None (Preliminary result)   Collection Time: 08/20/14 10:00 PM  Result Value Ref Range Status   Specimen Description BLOOD LEFT ARM  Final   Special Requests BOTTLES DRAWN AEROBIC AND ANAEROBIC 10CC  Final   Culture   Final           BLOOD CULTURE RECEIVED NO GROWTH TO DATE CULTURE WILL BE HELD FOR 5 DAYS BEFORE ISSUING A FINAL NEGATIVE REPORT Performed at [facility]    Report Status PENDING  Incomplete  Culture, blood  (routine x 2)     Status: None (Preliminary result)   Collection Time: 08/20/14 10:10 PM  Result Value Ref Range Status   Specimen Description BLOOD LEFT HAND  Final   Special Requests BOTTLES DRAWN AEROBIC AND ANAEROBIC 10CC  Final   Culture   Final           BLOOD CULTURE RECEIVED NO GROWTH TO DATE CULTURE WILL BE HELD FOR 5 DAYS  BEFORE ISSUING A FINAL NEGATIVE REPORT Performed at [facility]    Report Status PENDING  Incomplete     Labs: Basic Metabolic Panel:  Recent Labs Lab 08/20/14 1138 08/21/14 0830 08/22/14 0246 08/24/14 0540  NA 136 138 134* 139  K 4.5 4.0 4.6 4.1  CL 107 107 106 111  CO2 23 23 20 24   GLUCOSE 105* 137* 122* 101*  BUN 40* 35* 33* 31*  CREATININE 1.91* 1.93* 1.94* 1.86*  CALCIUM 8.5 8.2* 7.8* 8.2*   Liver Function Tests:  Recent Labs Lab 08/21/14 0830  AST 23  ALT 19  ALKPHOS 50  BILITOT 0.7  PROT 6.7  ALBUMIN 2.4*   No results for input(s): LIPASE, AMYLASE in the last 168 hours. No results for input(s): AMMONIA in the last 168 hours. CBC:  Recent Labs Lab 08/20/14 1138 08/21/14 0830 08/22/14 0246 08/23/14 0800 08/24/14 0540  WBC 19.5* 17.7* 18.0* 15.1* 13.4*  NEUTROABS  --  14.7* 14.3*  --   --   HGB 10.9* 10.5* 9.5* 9.7* 10.1*  HCT 33.1* 32.2* 29.5* 30.0* 30.6*  MCV 95.9 96.7 97.7 97.1 97.8  PLT 391 404* 370 430* 427*   Cardiac Enzymes:  Recent Labs Lab 08/20/14 2200 08/21/14 0129 08/21/14 0830  TROPONINI <0.03 <0.03 <0.03   BNP: BNP (last 3 results) No results for input(s): PROBNP in the last 8760 hours. CBG: No results for input(s): GLUCAP in the last 168 hours.     SignedDelfina Redwood  [facility] 08/24/2014, 10:54 AM

## 2014-08-24 NOTE — Progress Notes (Signed)
I instructed pt to administer lovenox and he successfully administered himself the noon dose. I gave him the lovenox teaching kit and reviewed the contents. Pt felt comfortable administering this to himself. Pt stable and ready for discharge. Awaiting ride

## 2014-08-24 NOTE — Progress Notes (Addendum)
SUBJECTIVE:  No complaints except for mild discomfort on left chest when lying flat only and resolves when sitting up.  It is very vague.  He walks in the halls and in the room without any CP.    OBJECTIVE:   Vitals:   Filed Vitals:   08/23/14 0535 08/23/14 1500 08/23/14 1929 08/24/14 0500  BP: 128/65 144/77 138/69 130/60  Pulse: 77 95 94 82  Temp: 98.2 F (36.8 C) 98.2 F (36.8 C) 98.4 F (36.9 C) 99 F (37.2 C)  TempSrc: Oral Oral Oral   Resp: 18 18 18 12   Height:      Weight: 201 lb 11.2 oz (91.491 kg)   197 lb 11.2 oz (89.676 kg)  SpO2: 97% 100% 100% 95%   I&O's:   Intake/Output Summary (Last 24 hours) at 08/24/14 0905 Last data filed at 08/24/14 0500  Gross per 24 hour  Intake    720 ml  Output   1550 ml  Net   -830 ml   TELEMETRY: Reviewed telemetry pt in NSR:     PHYSICAL EXAM General: Well developed, well nourished, in no acute distress Head: Eyes PERRLA, No xanthomas.   Normal cephalic and atramatic  Lungs:   Clear bilaterally to auscultation and percussion. Heart:   HRRR S1 S2 Pulses are 2+ & equal. Abdomen: Bowel sounds are positive, abdomen soft and non-tender without masses Extremities:   No clubbing, cyanosis or edema.  DP +1 Neuro: Alert and oriented X 3. Psych:  Good affect, responds appropriately   LABS: Basic Metabolic Panel:  Recent Labs  08/22/14 0246 08/24/14 0540  NA 134* 139  K 4.6 4.1  CL 106 111  CO2 20 24  GLUCOSE 122* 101*  BUN 33* 31*  CREATININE 1.94* 1.86*  CALCIUM 7.8* 8.2*   Liver Function Tests: No results for input(s): AST, ALT, ALKPHOS, BILITOT, PROT, ALBUMIN in the last 72 hours. No results for input(s): LIPASE, AMYLASE in the last 72 hours. CBC:  Recent Labs  08/22/14 0246 08/23/14 0800 08/24/14 0540  WBC 18.0* 15.1* 13.4*  NEUTROABS 14.3*  --   --   HGB 9.5* 9.7* 10.1*  HCT 29.5* 30.0* 30.6*  MCV 97.7 97.1 97.8  PLT 370 430* 427*   Cardiac Enzymes: No results for input(s): CKTOTAL, CKMB, CKMBINDEX,  TROPONINI in the last 72 hours. BNP: Invalid input(s): POCBNP D-Dimer: No results for input(s): DDIMER in the last 72 hours. Hemoglobin A1C: No results for input(s): HGBA1C in the last 72 hours. Fasting Lipid Panel: No results for input(s): CHOL, HDL, LDLCALC, TRIG, CHOLHDL, LDLDIRECT in the last 72 hours. Thyroid Function Tests: No results for input(s): TSH, T4TOTAL, T3FREE, THYROIDAB in the last 72 hours.  Invalid input(s): FREET3 Anemia Panel: No results for input(s): VITAMINB12, FOLATE, FERRITIN, TIBC, IRON, RETICCTPCT in the last 72 hours. Coag Panel:   Lab Results  Component Value Date   INR 1.52* 08/24/2014   INR 1.37 08/23/2014   INR 1.34 08/22/2014    RADIOLOGY: Dg Chest Port 1 View  08/20/2014   CLINICAL DATA:  Sternal chest pain, shortness of breath, weakness.  EXAM: PORTABLE CHEST - 1 VIEW  COMPARISON:  10/08/2011  FINDINGS: Studies AP lordotic in positioning. Heart and mediastinal contours are within normal limits. No focal opacities or effusions. No acute bony abnormality.  IMPRESSION: No active disease.   Electronically Signed   By: Rolm Baptise M.D.   On: 08/20/2014 12:40   Assessment/Plan: 81 yo male with subacute onset symptoms, including productive  cough, dyspnea, chest pain and dizziness. He has low grade fever on arrival at 99.4. Labs indicate a leukocytosis at 19K. There is acute on chronic kidney disease with a creatinine of 1.9. BNP is elevated at 417. CXR is unremarkable. I'm concerned about occult infection, this may be responsible for his atrial flutter. We will recommend anticoagulation with heparin (he is not  a good NOAC candidate due to his CKD) and rate control with IV diltiazem.  1. New onset atrial flutter with RVR, now s/p TEE/DCCV maintaining SR but with several short bursts of SVT.   - CHA2DS2-Vasc score 4 (age, HTN, vascular disease) - 2D echo with EF 45-50% with mild AS, TSH is normal.  - continue SQ Lovenox -  not a good NOAC candidate with Cr 1.91,Loading with Coumadin.INR today 1.52 - continue BB and increase to Toprol XL 50mg  daily  - awaiting case management for home lovenox therapy   2. Chest pain, persistent for the past 2 weeks - likely related to #1,  - negative trop X 3 after having persistent CP for 2 wks. No further chest pain. Now only with what he calls post cardioversion discomfort.He says it is hard to describe and only occurs when lying flat.  He denies any chest discomfort with moving around in the room, sitting up or walking in the halls. This does not sound cardiac.  Recommend consideration of outpt nuclear stress test.  Will leave up to Dr. Ellyn Hack    3. Leukocytosis - unclear cause, U/A negative, no sign of PNA on CXR.  - described coughing up thick grayish phlegm ? Bronchitis but lungs are clear  - Fever 99 on admit has currently resolved. - Received steroid injection several days ago which can elevate WBC  4. Acute diastolic heart failure secondary to rapid aflutter - fluid status stable, although describe having a cough, no rale on exam. Mild LE pitting edema.  - he has underlying renal insuff. He is on HCTZ for LE edema. Will leave diuretics up to IM. His acute exacerbation was most likely due to aflutter with RVR and appears resolved.  5. Hypertension- controlled - continue BB 6. Hyperlipidemia-LDL 57 on crestor 5 mg. 7. carotid artery stenosis: followed by Dr. Gwenlyn Found, stable, moderate 8. stage III chronic kidney disease 9. history of syncope 10. Mild LV dysfunction EF 45-50% ? Secondary to tachycardia.  Repeat echo in a few months to see if resolved. 11. Anemia, with decrease of H/H 10.9/33 on admit, now 10.1/30.6  Please have patient followup with Dr. Ellyn Hack or mid level provider in 1 week.  He will need coumadin clinic visit on Monday  1/18  Sueanne Margarita, MD  08/24/2014  9:05 AM

## 2014-08-27 ENCOUNTER — Ambulatory Visit (INDEPENDENT_AMBULATORY_CARE_PROVIDER_SITE_OTHER): Payer: Medicare Other | Admitting: Pharmacist Clinician (PhC)/ Clinical Pharmacy Specialist

## 2014-08-27 DIAGNOSIS — Z7901 Long term (current) use of anticoagulants: Secondary | ICD-10-CM | POA: Diagnosis not present

## 2014-08-27 DIAGNOSIS — I4891 Unspecified atrial fibrillation: Secondary | ICD-10-CM | POA: Diagnosis not present

## 2014-08-27 HISTORY — DX: Long term (current) use of anticoagulants: Z79.01

## 2014-08-27 LAB — CULTURE, BLOOD (ROUTINE X 2)
CULTURE: NO GROWTH
Culture: NO GROWTH

## 2014-08-27 LAB — POCT INR: INR: 2.7

## 2014-08-28 ENCOUNTER — Ambulatory Visit: Payer: Medicare Other | Admitting: Emergency Medicine

## 2014-08-29 ENCOUNTER — Ambulatory Visit (INDEPENDENT_AMBULATORY_CARE_PROVIDER_SITE_OTHER): Payer: Medicare Other | Admitting: Pharmacist Clinician (PhC)/ Clinical Pharmacy Specialist

## 2014-08-29 ENCOUNTER — Ambulatory Visit (INDEPENDENT_AMBULATORY_CARE_PROVIDER_SITE_OTHER): Payer: Medicare Other | Admitting: Cardiology

## 2014-08-29 ENCOUNTER — Encounter: Payer: Self-pay | Admitting: Cardiology

## 2014-08-29 VITALS — BP 124/66 | HR 67 | Ht 68.0 in | Wt 198.4 lb

## 2014-08-29 DIAGNOSIS — R0600 Dyspnea, unspecified: Secondary | ICD-10-CM

## 2014-08-29 DIAGNOSIS — E785 Hyperlipidemia, unspecified: Secondary | ICD-10-CM

## 2014-08-29 DIAGNOSIS — R9431 Abnormal electrocardiogram [ECG] [EKG]: Secondary | ICD-10-CM

## 2014-08-29 DIAGNOSIS — I5041 Acute combined systolic (congestive) and diastolic (congestive) heart failure: Secondary | ICD-10-CM

## 2014-08-29 DIAGNOSIS — I4891 Unspecified atrial fibrillation: Secondary | ICD-10-CM

## 2014-08-29 DIAGNOSIS — Z7901 Long term (current) use of anticoagulants: Secondary | ICD-10-CM | POA: Diagnosis not present

## 2014-08-29 DIAGNOSIS — E669 Obesity, unspecified: Secondary | ICD-10-CM

## 2014-08-29 DIAGNOSIS — I10 Essential (primary) hypertension: Secondary | ICD-10-CM | POA: Diagnosis not present

## 2014-08-29 LAB — POCT INR: INR: 2.4

## 2014-08-29 NOTE — Patient Instructions (Signed)
Your physician has requested that you have a lexiscan myoview. For further information please visit [email or web address]. Please follow instruction sheet, as given.  May not go to work until after test results and results are given to patient.  Your physician recommends that you schedule a follow-up appointment in 1 month Dr Ellyn Hack to see how patient is feeling.

## 2014-08-30 ENCOUNTER — Telehealth (HOSPITAL_COMMUNITY): Payer: Self-pay

## 2014-08-30 ENCOUNTER — Encounter: Payer: Self-pay | Admitting: Cardiology

## 2014-08-30 NOTE — Telephone Encounter (Signed)
Encounter complete. 

## 2014-08-30 NOTE — Progress Notes (Signed)
PATIENT: Mark Jackson. MRN: [id number]  DOB: 1929-11-13   DOV:08/30/2014 PCP: Jenny Reichmann, MD  Clinic Note: Chief Complaint  Patient presents with  . Follow-up    post cardioversion & TEE   . Atrial Fibrillation    New diagnosis   HPI: Mark Jackson. is a 81 y.o.  male with a PMH below who presents today for what is actually a one-year followup of a history of syncopal episodes and multiple cardiac risk factors. Coincidentally, it is also a post hospital follow-up after recent diagnosis of atrial fibrillation. Prior to this hospitalization in January 2016: He had a pretty detailed cardiovascular evaluation including carotid Dopplers which showed some stenosis of the right carotid of 60-80 % and is being followed by Dr. Quay Burow in this office for potential invasive evaluation in the future.  An echocardiogram will which was essentially normal with normal function normal EF of 55%. No valvular lesions. He also had a Myoview done that was negative for ischemia.  Interval History:  He was admitted to [facility] on January 11 with 2 weeks worth of persistently worsening symptoms of dyspnea and chest discomfort. He basically had returned from a long car trip to [place], [place] -- riding with his daughter to see a new grandson. He is riding in a car that was not well he did. It was quite cold. There is significant amount social stress going on prior to the trip and continued on the trip. Again feeling poorly during this trip with just generalized malaise nausea weakness. He was noting intermittent chest discomfort. However on the day of admission had been noticing that he is feeling more weak than usual and very tired. When he endeavored to go help his wife get ready for an appointment, he was unable to stand and felt family week. He did not notice any rapid or irregular heartbeats just was unable to move. He went via primary physician to the emergency room and was admitted to  the [facility]. Apparently he had had an epidural injection one week before. Low he denied any sensation of fevers or chills prior to arrival, he did have low-grade fever with leukocytosis during the hospital stay. He was also noted to be in atrial flutter/fibrillation with rapid ventricular rate in the emergency room. HEENT did not complain of any abdominal pain dysuria, hematuria or diarrhea. He did note having chest discomfort and dyspnea that was worse with exertion. He was felt to have acute diastolic heart failure secondary to his atrial flutter. Echocardiogram did suggest mild reduction EF that could be A. fib. He was started on diltiazem drip and anticoagulated with heparin. Because of his creatinine being elevated, he was started on warfarin for anticoagulation.  He underwent TEE guided cardioversion. He was discharged on January 15.   Denies any irregular or rapid heartbeats. No TIA or amaurosis fugax symptoms. No melena, hematochezia or hematuria. No claudication symptoms.  Past Medical History  Diagnosis Date  . Essential hypertension   . Prostate ca   . Left ventricular diastolic dysfunction, NYHA class 08 October 2012    Echocardiogram March 2014: EF 55-60%. Moderate concentric LVH. Grade 1 diastolic dysfunction. Very mild aortic stenosis/sclerosis.   . Anemia   . Carotid artery disease     Right carotid 60-80% stenosis; stable from 2013-2014  . Hyperlipidemia   . Chronic kidney disease (CKD), stage III (moderate) B     Creatinine roughly 1.8-2.0  . Obesity (BMI  30-39.9) 09/03/2013  . Essential hypertension 10/22/2008    Qualifier: Diagnosis of  By: [name] CMA ([place]), Mearl Latin    . Atrial fibrillation, new onset 08/20/2014    Status post TEE cardioversion; 2-D echo: EF 45-50%, (mildly reduced) diffuse hypokinesis, mild concentric LVH, mild aortic stenosis (P/M gradient = 28/16 mmHg)  . Long term current use of anticoagulant therapy 08/27/2014    On warfarin  . Mild  aortic stenosis by prior echocardiogram March 2014     Very mild aortic stenosis noted in March 2014, room is mild stenosis in January 2016    Prior Cardiac Evaluation and Past Surgical History: Procedure wasLaterality Date  . Nm myoview ltd  February 2012    Persantine Myoview: Inferior attenuation artifact but no ischemia or infarction. Normal EF.  . Carotid doppler  10/21/2012    Right bulb 50-69% diameter reduction; Right proximal ICA 70-99% diameter reduction; left ICA 0-49% diameter reduction.  . Cardiovascular stress test  10/03/2010    Perfusion defect in the inferior myocardial region consistent with diaphragmatic attenuation. Remaining myocardium demonstrates normal myocardial perfusion with no evidence of ischemia or infarct. No ECG changes. EKG negative for ischemia.  . Transthoracic echocardiogram  10/21/2012; 08/20/2014    EF 55-60%. Moderate concentric hypertrophy, grade 1 diastolic dysfunction; b) [id number]: EF 45-50%, Mild concentric LVH, mild AS (p-m gradient 21-16 mmHg),   . Tee without cardioversion N/A 08/22/2014    Procedure: TRANSESOPHAGEAL ECHOCARDIOGRAM (TEE);  Surgeon: Josue Hector, MD;  Location: [facility] ENDOSCOPY;  Service: Cardiovascular;  Laterality: N/A;  . Cardioversion N/A 08/22/2014    Procedure: CARDIOVERSION;  Surgeon: Josue Hector, MD;  Location: [facility] ENDOSCOPY;  Service: Cardiovascular;  Laterality: N/A;    Allergies  Allergen Reactions  . Plavix [Clopidogrel Bisulfate]     Significant bruising    Current Outpatient Prescriptions  Medication Sig Dispense Refill  . acetaminophen (TYLENOL) 325 MG tablet Take 325-650 mg by mouth every 6 (six) hours as needed for headache.     Marland Kitchen aspirin 81 MG tablet Take 81 mg by mouth 2 (two) times daily.     . baclofen (LIORESAL) 10 MG tablet Take 10 mg by mouth daily as needed (for headache).     . CRESTOR 5 MG tablet TAKE 1 TABLET ONCE DAILY. 90 tablet 3  . ferrous sulfate 325 (65 FE) MG tablet Take 325 mg by mouth daily with  breakfast.    . hydrochlorothiazide (MICROZIDE) 12.5 MG capsule Take 1 capsule (12.5 mg total) by mouth daily. 30 capsule 11  . HYDROcodone-homatropine (HYCODAN) 5-1.5 MG/5ML syrup Take 5 mLs by mouth every 8 (eight) hours as needed for cough. 120 mL 0  . LOPERAMIDE HCL PO Take 2-3 tablets by mouth every 6 (six) hours as needed (diarrhea).    . metoprolol succinate (TOPROL-XL) 50 MG 24 hr tablet Take 1 tablet (50 mg total) by mouth daily. Take with or immediately following a meal. 30 tablet 1  . Multiple Vitamins-Minerals (MULTIVITAMIN WITH MINERALS) tablet Take 1 tablet by mouth daily.    . Tetrahydrozoline HCl (VISINE OP) Apply 1 drop to eye 2 (two) times daily as needed (burning of eyes).    . topiramate (TOPAMAX) 100 MG tablet Take 100 mg by mouth at bedtime.    . topiramate (TOPAMAX) 25 MG tablet Take 25 mg by mouth 2 (two) times daily.    Marland Kitchen warfarin (COUMADIN) 5 MG tablet Take 1 tablet (5 mg total) by mouth one time only at 6 PM. Or as directed  30 tablet 0   Current Facility-Administered Medications  Medication Dose Route Frequency Provider Last Rate Last Dose  . cyanocobalamin ((VITAMIN B-12)) injection 1,000 mcg  1,000 mcg Intramuscular Q30 days [name], MD   1,000 mcg at 07/26/14 1215    History   Social History Narrative   He is about 81 year old overweight gentleman who is married with 4 children, and 11 grandchildren with 2 great-grandchildren. 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   He used to smoke a pipe for several years but quit about 30 some years ago. He does not drink alcohol.         ROS: A comprehensive Review of Systems - was performed Review of Systems  Constitutional: Positive for malaise/fatigue. Negative for fever and chills.  Respiratory: Positive for shortness of breath. Negative for cough and hemoptysis.   Cardiovascular: Positive for leg swelling (Chronic, not more than usual). Negative for  claudication.       Per history of present illness  Gastrointestinal: Negative for constipation, blood in stool and melena.  Genitourinary: Negative for hematuria.  Musculoskeletal: Positive for myalgias (Generalized body aches) and joint pain.  Neurological: Positive for dizziness (Positional) and weakness. Negative for focal weakness and loss of consciousness.  Endo/Heme/Allergies: Does not bruise/bleed easily.  Psychiatric/Behavioral: Negative for depression and memory loss. The patient does not have insomnia.        Under significant amount of social stress. 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 He is very torn with having to deal with a long-term employee who has a "confrontational relationship with his daughter, who is also employee. His illness began shortly after major confrontation between these 2 and during a long trip to [place] in a nonacute car.  All other systems reviewed and are negative.    Negative except Mild edema that  is off and on. He takes Lasix for that is usually well-controlled. he has a new migraine doctor which seemed to be really helping a lot.  PHYSICAL EXAM BP 124/66 mmHg  Pulse 67  Ht 5\' 8"  (1.727 m)  Wt 198 lb 6.4 oz (89.994 kg)  BMI 30.17 kg/m2 General appearance: alert, cooperative, appears stated age, mildly obese and Well-nourished, well-groomed; he does appear tired and fatigued, mildly ill-appearing Neck: no adenopathy, no JVD and Soft right carotid bruit Lungs: clear to auscultation bilaterally, normal percussion bilaterally and Nonlabored, good air movement Heart: normal apical impulse, regular rate and rhythm, S1, S2 normal and 1-2/6 crescendo decrescendo mid-early peaking SEM at RUSB.  Radiates to carotids. Otherwise no R./G. Abdomen: soft, non-tender; bowel sounds normal; no masses,  no organomegaly and Truncal obesity Extremities: edema 1-2+ bilaterally with mild venous stasis changes and mild varicosities. Pulses: 2+ and  symmetric Neurologic: Grossly normal  [id number] today: Yes Rate: 67 , Rhythm: NSR; somewhat symmetrical T-wave inversions beginning in leads V3 through V6 more pronounced in the more lateral leads. Also noted subtle T-wave inversions in lead 1, 2 and flat in aVL -- consider lateral  ischemia  Recent Labs: Reviewed in Epic.    Chemistry      Component Value Date/Time   NA 139 08/24/2014 0540   K 4.1 08/24/2014 0540   CL 111 08/24/2014 0540   CO2 24 08/24/2014 0540   BUN 31* 08/24/2014 0540   CREATININE 1.86* 08/24/2014 0540   CREATININE 1.79* 05/15/2014 1126      Component Value Date/Time  CALCIUM 8.2* 08/24/2014 0540   ALKPHOS 50 08/21/2014 0830   AST 23 08/21/2014 0830   ALT 19 08/21/2014 0830   BILITOT 0.7 08/21/2014 0830      ASSESSMENT / PLAN: Recent admission for what amounts to be a likely viral syndrome complicated by A. fib with RVR. Slow recovery following cardioversion. He remains quite weak, and not yet ready to go back to work. He does have an abnormal EKG with concerning lateral T-wave inversions not previously noted. Will need to exclude potential ischemic etiology for A. Fib, Caryl Pina since he did note some chest discomfort while in A. fib.  No problem-specific assessment & plan notes found for this encounter.   Orders Placed This Encounter  Procedures  . Myocardial Perfusion Imaging    Standing Status: Future     Number of Occurrences:      Standing Expiration Date: 08/29/2015    Order Specific Question:  Where should this test be performed    Answer:  [facility]    Order Specific Question:  Type of stress    Answer:  Lexiscan    Order Specific Question:  Patient weight in lbs    Answer:  198  . EKG 12-Lead   No orders of the defined types were placed in this encounter.    Followup:   [name], M.D., M.S. Interventional Cardiologist   Pager # [phone]

## 2014-08-31 DIAGNOSIS — R9431 Abnormal electrocardiogram [ECG] [EKG]: Secondary | ICD-10-CM | POA: Insufficient documentation

## 2014-08-31 DIAGNOSIS — R0600 Dyspnea, unspecified: Secondary | ICD-10-CM | POA: Insufficient documentation

## 2014-08-31 NOTE — Assessment & Plan Note (Signed)
Well-controlled on current meds 

## 2014-08-31 NOTE — Assessment & Plan Note (Addendum)
81 year old gentleman with multiple cardiac risk factors now with new onset atrial fibrillation -- requiring TEE guided cardioversion.  This was likely in the setting of a potential viral or bacterial illness. However in light of lateral T wave inversions and significant dyspnea with chest pain associated with A. Fib, we will evaluate for potential coronary ischemia with Myoview stress test. He will not be able to walk on a treadmill, therefore we'll do a Lexiscan Myoview. I requested he does not return to work until we have the results of Myoview it showed no high-risk findings based on his symptoms.

## 2014-08-31 NOTE — Assessment & Plan Note (Signed)
The patient understands the need to lose weight with diet and exercise. We have discussed specific strategies for this.  

## 2014-08-31 NOTE — Assessment & Plan Note (Signed)
Remains moderate. However with atherosclerotic burden in the carotid arteries, we will need to exclude coronary disease as well.

## 2014-08-31 NOTE — Assessment & Plan Note (Signed)
T-wave inversions in lateral leads concerning for potential early ischemia. With new onset A. Fib Will evaluate with [employer]

## 2014-08-31 NOTE — Assessment & Plan Note (Signed)
On low-dose statin therapy. Lab Results  Component Value Date   CHOL 102 08/21/2014   HDL 32* 08/21/2014   LDLCALC 57 08/21/2014   TRIG 65 08/21/2014   CHOLHDL 3.2 08/21/2014   Well-controlled.

## 2014-08-31 NOTE — Assessment & Plan Note (Signed)
Multifactorial, however he continues to have dyspnea despite being in sinus rhythm.

## 2014-08-31 NOTE — Assessment & Plan Note (Signed)
Started on warfarin in the hospital. Not thought to be a good candidate for [facility] on renal insufficiency. I will have our pharmacist discuss potential options with him during an INR followup.

## 2014-08-31 NOTE — Assessment & Plan Note (Addendum)
He had heart failure symptoms while in the hospital. She does a primary cardiac function event versus the effects of atrial fibrillation with diastolic dysfunction. He did have a reduced EF ,roughly 45%,which could be related to him having anything for a while versus a new finding of cardiomyopathy.  Evaluation for ischemia with Myoview. He is on a stable dose of Toprol, but no ACE inhibitor secondary to renal insufficiency. His blood pressure looks good and he is not currently requiring diuretic.

## 2014-09-05 ENCOUNTER — Encounter (HOSPITAL_COMMUNITY): Payer: Medicare Other

## 2014-09-05 ENCOUNTER — Ambulatory Visit: Payer: Medicare Other | Admitting: Pharmacist Clinician (PhC)/ Clinical Pharmacy Specialist

## 2014-09-05 ENCOUNTER — Ambulatory Visit (INDEPENDENT_AMBULATORY_CARE_PROVIDER_SITE_OTHER): Payer: Medicare Other | Admitting: Pharmacist Clinician (PhC)/ Clinical Pharmacy Specialist

## 2014-09-05 ENCOUNTER — Ambulatory Visit (HOSPITAL_COMMUNITY)
Admission: RE | Admit: 2014-09-05 | Discharge: 2014-09-05 | Disposition: A | Discharge status: Home / Self Care | Payer: Medicare Other | Source: Ambulatory Visit | Attending: Cardiovascular Disease | Admitting: Cardiovascular Disease

## 2014-09-05 DIAGNOSIS — R42 Dizziness and giddiness: Secondary | ICD-10-CM | POA: Insufficient documentation

## 2014-09-05 DIAGNOSIS — I4891 Unspecified atrial fibrillation: Secondary | ICD-10-CM | POA: Diagnosis not present

## 2014-09-05 DIAGNOSIS — Z7901 Long term (current) use of anticoagulants: Secondary | ICD-10-CM

## 2014-09-05 DIAGNOSIS — R0602 Shortness of breath: Secondary | ICD-10-CM | POA: Insufficient documentation

## 2014-09-05 DIAGNOSIS — R079 Chest pain, unspecified: Secondary | ICD-10-CM | POA: Insufficient documentation

## 2014-09-05 DIAGNOSIS — E663 Overweight: Secondary | ICD-10-CM | POA: Diagnosis not present

## 2014-09-05 DIAGNOSIS — E785 Hyperlipidemia, unspecified: Secondary | ICD-10-CM | POA: Diagnosis not present

## 2014-09-05 DIAGNOSIS — Z87891 Personal history of nicotine dependence: Secondary | ICD-10-CM | POA: Diagnosis not present

## 2014-09-05 DIAGNOSIS — R9431 Abnormal electrocardiogram [ECG] [EKG]: Secondary | ICD-10-CM | POA: Diagnosis not present

## 2014-09-05 DIAGNOSIS — I10 Essential (primary) hypertension: Secondary | ICD-10-CM | POA: Insufficient documentation

## 2014-09-05 DIAGNOSIS — R0600 Dyspnea, unspecified: Secondary | ICD-10-CM | POA: Diagnosis not present

## 2014-09-05 DIAGNOSIS — R5383 Other fatigue: Secondary | ICD-10-CM | POA: Insufficient documentation

## 2014-09-05 LAB — POCT INR: INR: 2.2

## 2014-09-05 MED ORDER — TECHNETIUM TC 99M SESTAMIBI GENERIC - CARDIOLITE
10.1000 | Freq: Once | INTRAVENOUS | Status: AC | PRN
  Administered 2014-09-05: 10.1 via INTRAVENOUS

## 2014-09-05 MED ORDER — REGADENOSON 0.4 MG/5ML IV SOLN
0.4000 mg | Freq: Once | INTRAVENOUS | Status: AC
  Administered 2014-09-05: 0.4 mg via INTRAVENOUS

## 2014-09-05 MED ORDER — TECHNETIUM TC 99M SESTAMIBI GENERIC - CARDIOLITE
31.2000 | Freq: Once | INTRAVENOUS | Status: AC | PRN
  Administered 2014-09-05: 31.2 via INTRAVENOUS

## 2014-09-05 NOTE — Progress Notes (Signed)
Quick Note:  Stress Test looked good!! No sign of significant Heart Artery Disease. Pump function is normal. Only abnormality was a small area of artifact commonly seen in these studies. Good news!!.  [name], MD  ______

## 2014-09-05 NOTE — Procedures (Addendum)
[facility] [address] [place] [address] [id number]  Cardiology Nuclear Med Study  Mark Jackson. is a 81 y.o. male     MRN : [id number]     DOB: August 30, 1929  Procedure Date: 09/05/2014  Nuclear Med Background Indication for Stress Test:  Evaluation for Ischemia, [facility] and Abnormal EKG History:  New onset AFIB w/RVR;Mild Aortic Stenosis;PAF;CHF;CKD III;Last NUC MPI on 10/03/2010-normal;EF=59% Cardiac Risk Factors: Carotid Disease, History of Smoking, Hypertension, Lipids and Overweight  Symptoms:  Chest Pain, Dizziness, DOE, Fatigue and SOB   Nuclear Pre-Procedure Caffeine/Decaff Intake:  7:00pm NPO After: 5:00am   IV Site: R Forearm  IV 0.9% NS with Angio Cath:  22g  Chest Size (in):  44"  IV Started by: Rolene Course, RN  Height: 5\' 8"  (1.727 m)  Cup Size: n/a  BMI:  Body mass index is 30.11 kg/(m^2). Weight:  198 lb (89.812 kg)   Tech Comments:  n/a    Nuclear Med Study 1 or 2 day study: 1 day  Stress Test Type:  [place]  Order Authorizing Provider:  [name], MD   Resting Radionuclide: Technetium 87m Sestamibi  Resting Radionuclide Dose: 10.1 mCi   Stress Radionuclide:  Technetium 18m Sestamibi  Stress Radionuclide Dose: 31.2 mCi           Stress Protocol Rest HR: 65 Stress HR:  61  Rest BP: 142/78 Stress BP: 135/67  Exercise Time (min): n/a METS: n/a   Predicted Max HR: 138 bpm % Max HR: 57.97 bpm Rate Pressure Product: 11360  Dose of Adenosine (mg):  n/a Dose of Lexiscan: 0.4 mg  Dose of Atropine (mg): n/a Dose of Dobutamine: n/a mcg/kg/min (at max HR)  Stress Test Technologist: Leane Para, CCT Nuclear Technologist: Imagene Riches, CNMT   Rest Procedure:  Myocardial perfusion imaging was performed at rest 45 minutes following the intravenous administration of Technetium 51m Sestamibi. Stress Procedure:  The patient received IV Lexiscan 0.4 mg over 15-seconds.  Technetium 32m  Sestamibi injected IV at 30-seconds.  There were no significant changes with Lexiscan.  Quantitative spect images were obtained after a 45 minute delay.  Transient Ischemic Dilatation (Normal <1.22):  0.92 QGS EDV:  86 ml QGS ESV:  35 ml LV Ejection Fraction: 60%   Rest ECG: NSR with inferolateral T wave inversions  Stress ECG: No significant change from baseline ECG  QPS Raw Data Images:  Mild diaphragmatic attenuation.  Normal left ventricular size. Stress Images:  There is decreased uptake in the inferior wall. Rest Images:  There is decreased uptake in the inferior wall. Subtraction (SDS):  There is a fixed inferior defect that is most consistent with diaphragmatic attenuation.  Impression Exercise Capacity:  Lexiscan with no exercise. BP Response:  Hypotensive blood pressure response. Clinical Symptoms:  No significant symptoms noted. ECG Impression:  No significant ECG changes with Lexiscan. Comparison with Prior Nuclear Study: No significant change from previous study  Overall Impression:  Low risk stress nuclear study with small, mild, fixed area of inferior diaphragmatic attenuation.  LV Wall Motion:  NL LV Function; NL Wall Motion; LVEF 60%  Pixie Casino, MD, [facility] Board Certified in Nuclear Cardiology Attending Cardiologist [place], MD  09/05/2014 11:20 AM

## 2014-09-06 ENCOUNTER — Encounter: Payer: Self-pay | Admitting: *Deleted

## 2014-09-06 ENCOUNTER — Ambulatory Visit (INDEPENDENT_AMBULATORY_CARE_PROVIDER_SITE_OTHER): Payer: Medicare Other | Admitting: *Deleted

## 2014-09-06 VITALS — BP 118/70

## 2014-09-06 DIAGNOSIS — E538 Deficiency of other specified B group vitamins: Secondary | ICD-10-CM | POA: Diagnosis not present

## 2014-09-06 MED ORDER — CYANOCOBALAMIN 1000 MCG/ML IJ SOLN
1000.0000 ug | Freq: Once | INTRAMUSCULAR | Status: AC
  Administered 2014-09-06: 1000 ug via INTRAMUSCULAR

## 2014-09-06 NOTE — Progress Notes (Signed)
   Subjective:    Patient ID: Mark Jackson., male    DOB: [birth year], 81 y.o.   MRN: [id number]  HPI  B 12 injection only.    Review of Systems     Objective:   Physical Exam        Assessment & Plan:

## 2014-09-11 ENCOUNTER — Ambulatory Visit (INDEPENDENT_AMBULATORY_CARE_PROVIDER_SITE_OTHER): Payer: Medicare Other | Admitting: Emergency Medicine

## 2014-09-11 ENCOUNTER — Encounter: Payer: Self-pay | Admitting: Emergency Medicine

## 2014-09-11 VITALS — BP 132/72 | HR 71 | Temp 97.8°F | Resp 16 | Ht 70.0 in | Wt 198.4 lb

## 2014-09-11 DIAGNOSIS — I4891 Unspecified atrial fibrillation: Secondary | ICD-10-CM | POA: Diagnosis not present

## 2014-09-11 DIAGNOSIS — R079 Chest pain, unspecified: Secondary | ICD-10-CM

## 2014-09-11 DIAGNOSIS — H6121 Impacted cerumen, right ear: Secondary | ICD-10-CM | POA: Diagnosis not present

## 2014-09-11 DIAGNOSIS — N289 Disorder of kidney and ureter, unspecified: Secondary | ICD-10-CM

## 2014-09-11 LAB — BASIC METABOLIC PANEL WITH GFR
BUN: 40 mg/dL — AB (ref 6–23)
CALCIUM: 8.4 mg/dL (ref 8.4–10.5)
CHLORIDE: 106 meq/L (ref 96–112)
CO2: 24 mEq/L (ref 19–32)
CREATININE: 2.08 mg/dL — AB (ref 0.50–1.35)
GFR, EST NON AFRICAN AMERICAN: 29 mL/min — AB
GFR, Est African American: 33 mL/min — ABNORMAL LOW
GLUCOSE: 126 mg/dL — AB (ref 70–99)
POTASSIUM: 4.5 meq/L (ref 3.5–5.3)
SODIUM: 137 meq/L (ref 135–145)

## 2014-09-11 LAB — CBC WITH DIFFERENTIAL/PLATELET
BASOS ABS: 0 10*3/uL (ref 0.0–0.1)
BASOS PCT: 0 % (ref 0–1)
EOS ABS: 0.6 10*3/uL (ref 0.0–0.7)
EOS PCT: 6 % — AB (ref 0–5)
HEMATOCRIT: 33.8 % — AB (ref 39.0–52.0)
Hemoglobin: 11.2 g/dL — ABNORMAL LOW (ref 13.0–17.0)
Lymphocytes Relative: 17 % (ref 12–46)
Lymphs Abs: 1.8 10*3/uL (ref 0.7–4.0)
MCH: 31.5 pg (ref 26.0–34.0)
MCHC: 33.1 g/dL (ref 30.0–36.0)
MCV: 95.2 fL (ref 78.0–100.0)
MPV: 8.8 fL (ref 8.6–12.4)
Monocytes Absolute: 0.7 10*3/uL (ref 0.1–1.0)
Monocytes Relative: 7 % (ref 3–12)
NEUTROS ABS: 7.4 10*3/uL (ref 1.7–7.7)
NEUTROS PCT: 70 % (ref 43–77)
PLATELETS: 305 10*3/uL (ref 150–400)
RBC: 3.55 MIL/uL — ABNORMAL LOW (ref 4.22–5.81)
RDW: 13.4 % (ref 11.5–15.5)
WBC: 10.5 10*3/uL (ref 4.0–10.5)

## 2014-09-11 NOTE — Progress Notes (Signed)
   Subjective:    Patient ID: Mark Harris., male    DOB: Jan 12, 1930, 81 y.o.   MRN: [id number] This chart was scribed for [name], MD by Zola Button, Medical Scribe. This patient was seen in room 21 and the patient's care was started at 10:26 AM.   HPI HPI Comments: Mark Jackson. is a 81 y.o. male with a hx of A Fib who presents to the [facility] for a follow-up. He was admitted to the hospital about 3 weeks ago. Patient states he has improved significantly since his hospitalization, but is still not to where he was before. He is still hoarse and SOB, but most of his chest pain has subsided. Patient has lost about 5-6 pounds since then as well. He is still out of work as instructed by his cardiologist; he is unsure when he will be allowed to return to work. He got his last vitamin B12 shot on 1/28. Patient has been going to the coumadin clinic.  Patient would like to have his ears checked for wax.  His grandchildren are 57 and 52.   Review of Systems  HENT: Positive for voice change.   Respiratory: Positive for shortness of breath.   Cardiovascular: Negative for chest pain.       Objective:   Physical Exam CONSTITUTIONAL: Well developed/well nourished HEAD: Normocephalic/atraumatic EYES: EOM/PERRL ENMT: Mucous membranes moist. Wax occlusion of his right auditory canal. NECK: supple no meningeal signs SPINE: entire spine nontender CV: S1/S2 noted, no murmurs/rubs/gallops noted. Regular rhythm today. LUNGS: Lungs are clear to auscultation bilaterally, no apparent distress ABDOMEN: soft, nontender, no rebound or guarding GU: no cva tenderness NEURO: Pt is awake/alert, moves all extremitiesx4 EXTREMITIES: pulses normal, full ROM SKIN: warm, color normal PSYCH: no abnormalities of mood noted        Assessment & Plan:  Patient doing well. He has had his stress test done. He has follow-up appt with cardiologist. Currently in sinus rhythm. Wax  present in right will be irrigated. He is UTD on B12 shots. Will check renal function test today. Overall he looks good today.I personally performed the services described in this documentation, which was scribed in my presence. The recorded information has been reviewed and is accurate.

## 2014-09-12 ENCOUNTER — Telehealth: Payer: Self-pay | Admitting: Cardiology

## 2014-09-12 NOTE — Telephone Encounter (Signed)
[name] is calling because he states he needs a letter from Dr, Ellyn Hack stating when he can go back to work . Please call   Thanks

## 2014-09-12 NOTE — Telephone Encounter (Signed)
Needs OK by Dr. Ellyn Hack to return to work. Patient was instructed to remain out of work until stress test results were reviewed.  Dr. Allison Quarry note on stress test 1/27 indicates no significant heart artery disease and normal pump function.

## 2014-09-13 ENCOUNTER — Encounter: Payer: Self-pay | Admitting: *Deleted

## 2014-09-13 NOTE — Telephone Encounter (Signed)
Wrote return to work Quarry manager. Called pt, he requested letter be mailed to him. This was printed, enveloped, addressed, and put in outgoing mail.

## 2014-09-13 NOTE — Telephone Encounter (Signed)
Mr. Ketcherside is OK to return to work since his nuclear stress test was negative.  [name], MD

## 2014-09-14 NOTE — Addendum Note (Signed)
Addended by: Constance Goltz on: 09/14/2014 08:53 AM   Modules accepted: Orders, SmartSet

## 2014-09-19 ENCOUNTER — Ambulatory Visit (INDEPENDENT_AMBULATORY_CARE_PROVIDER_SITE_OTHER): Payer: Medicare Other | Admitting: Pharmacist Clinician (PhC)/ Clinical Pharmacy Specialist

## 2014-09-19 DIAGNOSIS — I4891 Unspecified atrial fibrillation: Secondary | ICD-10-CM | POA: Diagnosis not present

## 2014-09-19 DIAGNOSIS — Z7901 Long term (current) use of anticoagulants: Secondary | ICD-10-CM | POA: Diagnosis not present

## 2014-09-19 LAB — POCT INR: INR: 2

## 2014-09-24 ENCOUNTER — Other Ambulatory Visit: Payer: Self-pay | Admitting: Emergency Medicine

## 2014-09-25 ENCOUNTER — Other Ambulatory Visit: Payer: Self-pay | Admitting: Emergency Medicine

## 2014-09-26 ENCOUNTER — Other Ambulatory Visit: Payer: Self-pay | Admitting: Cardiology

## 2014-09-26 ENCOUNTER — Other Ambulatory Visit: Payer: Self-pay | Admitting: Emergency Medicine

## 2014-09-26 NOTE — Telephone Encounter (Signed)
[facility] [phone] called regarding this patient.  They were calling to refill Coumadin 5mg  (30 tabs).  This is an pt of Dr. Everlene Farrier.

## 2014-09-26 NOTE — Telephone Encounter (Signed)
It would be better to get his Coumadin prescription from the cardiologist that treated him in the hospital. If he is unable to do this let me know I am will call in the prescription

## 2014-09-27 ENCOUNTER — Other Ambulatory Visit: Payer: Self-pay | Admitting: Emergency Medicine

## 2014-09-27 ENCOUNTER — Telehealth: Payer: Self-pay

## 2014-09-27 MED ORDER — WARFARIN SODIUM 5 MG PO TABS: 5.0000 mg | ORAL_TABLET | Freq: Once | ORAL | Status: DC

## 2014-09-27 NOTE — Telephone Encounter (Signed)
Spoke to patient.  Advised him that Dr. Everlene Farrier called in his coumadin to [facility] and that he is supposed to take it according to the directions of the cardiologist.  He said he understands that.

## 2014-09-27 NOTE — Telephone Encounter (Signed)
Dr Everlene Farrier sent in Rf

## 2014-10-02 ENCOUNTER — Ambulatory Visit (INDEPENDENT_AMBULATORY_CARE_PROVIDER_SITE_OTHER): Payer: Medicare Other | Admitting: Family Medicine

## 2014-10-02 DIAGNOSIS — E538 Deficiency of other specified B group vitamins: Secondary | ICD-10-CM

## 2014-10-08 ENCOUNTER — Ambulatory Visit (INDEPENDENT_AMBULATORY_CARE_PROVIDER_SITE_OTHER): Payer: Medicare Other | Admitting: Cardiology

## 2014-10-08 ENCOUNTER — Ambulatory Visit (INDEPENDENT_AMBULATORY_CARE_PROVIDER_SITE_OTHER): Payer: Medicare Other | Admitting: Pharmacist Clinician (PhC)/ Clinical Pharmacy Specialist

## 2014-10-08 ENCOUNTER — Encounter: Payer: Self-pay | Admitting: Cardiology

## 2014-10-08 VITALS — BP 120/66 | HR 60 | Ht 68.0 in | Wt 201.4 lb

## 2014-10-08 DIAGNOSIS — I480 Paroxysmal atrial fibrillation: Secondary | ICD-10-CM | POA: Diagnosis not present

## 2014-10-08 DIAGNOSIS — Z7901 Long term (current) use of anticoagulants: Secondary | ICD-10-CM | POA: Diagnosis not present

## 2014-10-08 DIAGNOSIS — E785 Hyperlipidemia, unspecified: Secondary | ICD-10-CM | POA: Diagnosis not present

## 2014-10-08 DIAGNOSIS — I10 Essential (primary) hypertension: Secondary | ICD-10-CM | POA: Diagnosis not present

## 2014-10-08 DIAGNOSIS — I5042 Chronic combined systolic (congestive) and diastolic (congestive) heart failure: Secondary | ICD-10-CM | POA: Diagnosis not present

## 2014-10-08 DIAGNOSIS — Z9189 Other specified personal risk factors, not elsewhere classified: Secondary | ICD-10-CM | POA: Diagnosis not present

## 2014-10-08 DIAGNOSIS — I4891 Unspecified atrial fibrillation: Secondary | ICD-10-CM | POA: Diagnosis not present

## 2014-10-08 DIAGNOSIS — Z87898 Personal history of other specified conditions: Secondary | ICD-10-CM

## 2014-10-08 LAB — POCT INR: INR: 1.9

## 2014-10-08 NOTE — Patient Instructions (Signed)
IF AFIB REOCCURS CALL OFFICE FIRST. IF OUT OF TOWN, GO TO ER   Your physician wants you to follow-up in JULY 2016 DR HARDING.  [place].  You will receive a reminder letter in the mail two months in advance. If you don't receive a letter, please call our office to schedule the follow-up appointment.

## 2014-10-09 ENCOUNTER — Encounter: Payer: Self-pay | Admitting: Cardiology

## 2014-10-09 NOTE — Assessment & Plan Note (Signed)
No recurrence. Relatively normal ischemic evaluation. For now we will continue rate control with metoprolol succinate.  We discussed course of action for recurrence of A. Fib since he did not feel well while in A. Fib. I think if he is out of town he should go to the closest ER. However if he is in town, the first step should be to contact the office because we may do treat this as an outpatient with the exception of over the weekend.  Would only consider antiarrhythmic treatmentIf he has recurrence of A. Fib in Relatively close proximity the index episode.

## 2014-10-09 NOTE — Assessment & Plan Note (Signed)
Unclear etiology. No recurrence.

## 2014-10-09 NOTE — Progress Notes (Signed)
PATIENT: Mark Jackson. MRN: [id number]  DOB: [birth year]   DOV:10/09/2014 PCP: Jenny Reichmann, MD  Clinic Note: Chief Complaint  Patient presents with  . Follow-up    STRESS TEST IN 1 /2016, NO CEST PAIN , SOB ,  EDEMA.   HPI: Mark Jackson. is a 81 y.o.  male with a PMH below who presents today for follow-up from ischemic evaluation for new-onset A. Fib associated with exertional dyspnea. He had a syncopal episodes in the past with noclear etiology found.  He had a pretty detailed cardiovascular evaluation including carotid Dopplers which showed some stenosis of the right carotid of 60-80 % and is being followed by Dr. Quay Burow in this office for potential invasive evaluation in the future.  An echocardiogram will which was essentially normal with normal function normal EF of 55%. No valvular lesions. He also had a Myoview done that was negative for ischemia.  In January of this year, he was hospitalized for worsening again virus syndrome as it was found to be in A. Fib with RVR and had mildly reduced EF. He was cardioverted with TE guidance and was started on warfarin for anticoagulation.  While in atrial flutter/fibrillation his echocardiogram showed diffuse hypokinesis with an EF of 45-50%, however now in sinus rhythm the Myoview showed EF of 60%. He continues to have mild aortic stenosis with a peak gradient of 28 mmHg and mean of 16 mmHg.  He was evaluated a Myoview stress test with results noted below.  Interval History : ASA comes in today feeling quite well. Back to work without major complaints. He still has a little exertional dyspnea going up and down steps but is really back to baseline. He denies any PND, orthopnea or edema. No recurrence of rapid irregular heart beats. No resting or exertional chest tightness or pressure. No syncope/near syncope or TIAs amaurosis fugax. No bleeding complications while on anticoagulation.  Past Medical History  Diagnosis Date  .  Essential hypertension   . Prostate ca   . Left ventricular diastolic dysfunction, NYHA class 08 October 2012    Echocardiogram March 2014: EF 55-60%. Moderate concentric LVH. Grade 1 diastolic dysfunction. Very mild aortic stenosis/sclerosis.   . Anemia   . Carotid artery disease     Right carotid 60-80% stenosis; stable from 2013-2014  . Hyperlipidemia   . Chronic kidney disease (CKD), stage III (moderate) B     Creatinine roughly 1.8-2.0  . Obesity (BMI 30-39.9) 09/03/2013  . Essential hypertension 10/22/2008    Qualifier: Diagnosis of  By: [name] CMA ([place]), Mearl Latin    . Atrial fibrillation, new onset 08/20/2014    Status post TEE cardioversion; 2-D echo: EF 45-50%, (mildly reduced) diffuse hypokinesis, mild concentric LVH, mild aortic stenosis (P/M gradient = 28/16 mmHg)  . Long term current use of anticoagulant therapy 08/27/2014    On warfarin  . Mild aortic stenosis by prior echocardiogram March 2014     Very mild aortic stenosis noted in March 2014, room is mild stenosis in January 2016    Past Surgical History  Procedure Laterality Date  . Partial knee arthroplasty Bilateral   . Tonsillectomy and adenoidectomy    . Nm myoview ltd  February 2012; 09/05/2014    a) 2012: Persantine Myoview: Inferior attenuation artifact but no ischemia or infarction. Normal EF.;; b) 08/2014: 60%. Fixed inferior defect likely diaphragmatic attenuation. LOW RISK.   . Carotid doppler  10/21/2012    Right bulb 50-69% diameter reduction; Right  proximal ICA 70-99% diameter reduction; left ICA 0-49% diameter reduction.  . Transthoracic echocardiogram  10/21/2012; 08/21/2014    EF 55-60%. Moderate concentric hypertrophy, grade 1 diastolic dysfunction; b) [id number]: EF 45-50%, Mild concentric LVH, mild AS (p-m gradient 21-16 mmHg),   . Tee without cardioversion N/A 08/22/2014    Procedure: TRANSESOPHAGEAL ECHOCARDIOGRAM (TEE);  Surgeon: Josue Hector, MD;  Location: [facility] ENDOSCOPY;  Service: Cardiovascular;  Laterality:  N/A;  . Cardioversion N/A 08/22/2014    Procedure: CARDIOVERSION;  Surgeon: Josue Hector, MD;  Location: [facility] ENDOSCOPY;  Service: Cardiovascular;  Laterality: N/A;    Allergies  Allergen Reactions  . Plavix [Clopidogrel Bisulfate]     Significant bruising    Current Outpatient Prescriptions  Medication Sig Dispense Refill  . acetaminophen (TYLENOL) 325 MG tablet Take 325-650 mg by mouth every 6 (six) hours as needed for headache.     Marland Kitchen aspirin 81 MG tablet Take 81 mg by mouth 2 (two) times daily.     . baclofen (LIORESAL) 10 MG tablet Take 10 mg by mouth daily as needed (for headache).     . CRESTOR 5 MG tablet TAKE 1 TABLET ONCE DAILY. 90 tablet 3  . ferrous sulfate 325 (65 FE) MG tablet Take 325 mg by mouth daily with breakfast.    . hydrochlorothiazide (MICROZIDE) 12.5 MG capsule Take 1 capsule (12.5 mg total) by mouth daily. 30 capsule 11  . HYDROcodone-homatropine (HYCODAN) 5-1.5 MG/5ML syrup Take 5 mLs by mouth every 8 (eight) hours as needed for cough. 120 mL 0  . LOPERAMIDE HCL PO Take 2-3 tablets by mouth every 6 (six) hours as needed (diarrhea).    . metoprolol succinate (TOPROL-XL) 50 MG 24 hr tablet Take 1 tablet (50 mg total) by mouth daily. Take with or immediately following a meal. 30 tablet 1  . Multiple Vitamins-Minerals (MULTIVITAMIN WITH MINERALS) tablet Take 1 tablet by mouth daily.    . Tetrahydrozoline HCl (VISINE OP) Apply 1 drop to eye 2 (two) times daily as needed (burning of eyes).    . topiramate (TOPAMAX) 100 MG tablet Take 100 mg by mouth at bedtime.    Marland Kitchen warfarin (COUMADIN) 5 MG tablet Take 1 tablet (5 mg total) by mouth one time only at 6 PM. Or as directed 30 tablet 0   Current Facility-Administered Medications  Medication Dose Route Frequency Provider Last Rate Last Dose  . cyanocobalamin ((VITAMIN B-12)) injection 1,000 mcg  1,000 mcg Intramuscular Q30 days [name], MD   1,000 mcg at 10/02/14 1322    History   Social History Narrative   He is  about 81 year old overweight gentleman who is married with 4 children, and 11 grandchildren with 2 great-grandchildren. 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   He used to smoke a pipe for several years but quit about 30 some years ago. He does not drink alcohol.         ROS: A comprehensive Review of Systems - was performed Review of Systems  Constitutional: Negative for malaise/fatigue.  Respiratory: Positive for shortness of breath (Climbing stairs or with increased exertion, but not as much as earlier.). Negative for cough.   Cardiovascular: Negative for chest pain, palpitations, leg swelling and PND.  Gastrointestinal: Negative for blood in stool and melena.  Genitourinary: Negative for hematuria.  Musculoskeletal: Negative.   Neurological: Negative for dizziness and headaches.  Endo/Heme/Allergies: Negative.   Psychiatric/Behavioral: Negative.   All other  systems reviewed and are negative.    Negative except Mild edema that  is off and on. He takes Lasix for that is usually well-controlled. he has a new migraine doctor which seemed to be really helping a lot.  PHYSICAL EXAM BP 120/66 mmHg  Pulse 60  Ht 5\' 8"  (1.727 m)  Wt 201 lb 6.4 oz (91.354 kg)  BMI 30.63 kg/m2 General appearance: alert, cooperative, appears stated age, mildly obese and Well-nourished, well-groomed; he does appear tired and fatigued, mildly ill-appearing Lungs: clear to auscultation bilaterally, normal percussion bilaterally and Nonlabored, good air movement Heart: normal apical impulse, regular rate and rhythm, S1, S2 normal and 1-2/6 crescendo decrescendo mid-early peaking SEM at RUSB.  Radiates to carotids. Otherwise no R./G. Abdomen: soft, non-tender; bowel sounds normal; no masses,  no organomegaly and Truncal obesity Extremities: edema 1-2+ bilaterally with mild venous stasis changes and mild varicosities. Pulses: 2+ and symmetric Neurologic:  Grossly normal  [id number] today: No   Recent Labs: Reviewed in Epic.   ASSESSMENT / PLAN: Feeling much better with no recurrence of Afib.  Tolerating anticoagulation.  No CHF Symptoms.  Non-ischemic Myoview with mildly reduced EF = suggestive of NICM. Of  Paroxysmal atrial fibrillation No recurrence. Relatively normal ischemic evaluation. For now we will continue rate control with metoprolol succinate.  We discussed course of action for recurrence of A. Fib since he did not feel well while in A. Fib. I think if he is out of town he should go to the closest ER. However if he is in town, the first step should be to contact the office because we may do treat this as an outpatient with the exception of over the weekend.  Would only consider antiarrhythmic treatmentIf he has recurrence of A. Fib in Relatively close proximity the index episode.   Chronic combined systolic and diastolic heart failure Most likely this is just simply related to diastolic dysfunction exacerbated by atrial fib with RVR.  As the fracture appears to be back to normal by Myoview as compared to the echocardiogram. No active symptoms currently. Would not start diuretic at this time. He does have some mild exertional dyspnea which is probably explained by his diastolic dysfunction as there was no evidence of ischemia.   H/O syncope Unclear etiology. No recurrence.   Hyperlipidemia On low-dose statin. Well-controlled by lab studies.   Essential hypertension Well-controlled on current medications. No change.     No orders of the defined types were placed in this encounter.   No orders of the defined types were placed in this encounter.    Followup:  July 2016.  [name], M.D., M.S. Interventional Cardiologist   Pager # [phone]

## 2014-10-09 NOTE — Assessment & Plan Note (Signed)
Well controlled on current medications. No change 

## 2014-10-09 NOTE — Assessment & Plan Note (Addendum)
Most likely this is just simply related to diastolic dysfunction exacerbated by atrial fib with RVR.  As the fracture appears to be back to normal by Myoview as compared to the echocardiogram. No active symptoms currently. Would not start diuretic at this time. He does have some mild exertional dyspnea which is probably explained by his diastolic dysfunction as there was no evidence of ischemia.

## 2014-10-09 NOTE — Assessment & Plan Note (Signed)
On low-dose statin. Well-controlled by lab studies.

## 2014-10-15 DIAGNOSIS — L718 Other rosacea: Secondary | ICD-10-CM | POA: Diagnosis not present

## 2014-10-15 DIAGNOSIS — L821 Other seborrheic keratosis: Secondary | ICD-10-CM | POA: Diagnosis not present

## 2014-10-15 DIAGNOSIS — D2371 Other benign neoplasm of skin of right lower limb, including hip: Secondary | ICD-10-CM | POA: Diagnosis not present

## 2014-10-15 DIAGNOSIS — D485 Neoplasm of uncertain behavior of skin: Secondary | ICD-10-CM | POA: Diagnosis not present

## 2014-10-15 DIAGNOSIS — L570 Actinic keratosis: Secondary | ICD-10-CM | POA: Diagnosis not present

## 2014-10-15 DIAGNOSIS — Z85828 Personal history of other malignant neoplasm of skin: Secondary | ICD-10-CM | POA: Diagnosis not present

## 2014-10-15 DIAGNOSIS — L565 Disseminated superficial actinic porokeratosis (DSAP): Secondary | ICD-10-CM | POA: Diagnosis not present

## 2014-10-15 DIAGNOSIS — D3617 Benign neoplasm of peripheral nerves and autonomic nervous system of trunk, unspecified: Secondary | ICD-10-CM | POA: Diagnosis not present

## 2014-10-22 ENCOUNTER — Ambulatory Visit (INDEPENDENT_AMBULATORY_CARE_PROVIDER_SITE_OTHER): Payer: Medicare Other | Admitting: Pharmacist Clinician (PhC)/ Clinical Pharmacy Specialist

## 2014-10-22 DIAGNOSIS — I4891 Unspecified atrial fibrillation: Secondary | ICD-10-CM

## 2014-10-22 DIAGNOSIS — Z7901 Long term (current) use of anticoagulants: Secondary | ICD-10-CM

## 2014-10-22 LAB — POCT INR: INR: 2

## 2014-10-23 ENCOUNTER — Other Ambulatory Visit: Payer: Self-pay | Admitting: Emergency Medicine

## 2014-10-24 NOTE — Telephone Encounter (Signed)
Dr Everlene Farrier, you saw pt recently and I can RF warfarin per protocol, but I like to check w/doctors on warfarin to make sure you don't want a PT/INR run. I also don't see that you have ever Rxd metoprolol for pt. Please advise.

## 2014-11-02 ENCOUNTER — Ambulatory Visit: Payer: Medicare Other | Admitting: *Deleted

## 2014-11-02 DIAGNOSIS — E538 Deficiency of other specified B group vitamins: Secondary | ICD-10-CM

## 2014-11-02 MED ORDER — CYANOCOBALAMIN 1000 MCG/ML IJ SOLN
1000.0000 ug | INTRAMUSCULAR | Status: DC
Start: 2014-11-02 — End: 2015-03-15
  Administered 2014-11-02 – 2015-01-31 (×3): 1000 ug via INTRAMUSCULAR

## 2014-11-16 ENCOUNTER — Ambulatory Visit (INDEPENDENT_AMBULATORY_CARE_PROVIDER_SITE_OTHER): Payer: Medicare Other | Admitting: Pharmacist Clinician (PhC)/ Clinical Pharmacy Specialist

## 2014-11-16 DIAGNOSIS — Z7901 Long term (current) use of anticoagulants: Secondary | ICD-10-CM

## 2014-11-16 DIAGNOSIS — I4891 Unspecified atrial fibrillation: Secondary | ICD-10-CM | POA: Diagnosis not present

## 2014-11-16 LAB — POCT INR: INR: 2.4

## 2014-11-30 ENCOUNTER — Ambulatory Visit (INDEPENDENT_AMBULATORY_CARE_PROVIDER_SITE_OTHER): Payer: Medicare Other

## 2014-11-30 DIAGNOSIS — E538 Deficiency of other specified B group vitamins: Secondary | ICD-10-CM

## 2014-11-30 NOTE — Progress Notes (Signed)
   Subjective:    Patient ID: Mark Jackson., male    DOB: 01/08/1930, 81 y.o.   MRN: [id number]  HPI Patient here for B-12 shot only.  Review of Systems     Objective:   Physical Exam        Assessment & Plan:

## 2014-12-14 ENCOUNTER — Ambulatory Visit (INDEPENDENT_AMBULATORY_CARE_PROVIDER_SITE_OTHER): Payer: Medicare Other | Admitting: Pharmacist Clinician (PhC)/ Clinical Pharmacy Specialist

## 2014-12-14 DIAGNOSIS — Z7901 Long term (current) use of anticoagulants: Secondary | ICD-10-CM

## 2014-12-14 DIAGNOSIS — I4891 Unspecified atrial fibrillation: Secondary | ICD-10-CM | POA: Diagnosis not present

## 2014-12-14 LAB — POCT INR: INR: 2.7

## 2014-12-27 DIAGNOSIS — G43009 Migraine without aura, not intractable, without status migrainosus: Secondary | ICD-10-CM | POA: Diagnosis not present

## 2015-01-03 ENCOUNTER — Ambulatory Visit (INDEPENDENT_AMBULATORY_CARE_PROVIDER_SITE_OTHER): Payer: Medicare Other | Admitting: Family Medicine

## 2015-01-03 VITALS — BP 108/68

## 2015-01-03 DIAGNOSIS — E538 Deficiency of other specified B group vitamins: Secondary | ICD-10-CM | POA: Diagnosis not present

## 2015-01-03 NOTE — Progress Notes (Signed)
   Subjective:    Patient ID: Mark Jackson., male    DOB: Jul 19, 1930, 81 y.o.   MRN: [id number]  HPI  Patient here for vitamin b12 shot only    Review of Systems     Objective:   Physical Exam        Assessment & Plan:

## 2015-01-10 ENCOUNTER — Encounter: Payer: Self-pay | Admitting: Emergency Medicine

## 2015-01-10 ENCOUNTER — Ambulatory Visit (INDEPENDENT_AMBULATORY_CARE_PROVIDER_SITE_OTHER): Payer: Medicare Other | Admitting: Emergency Medicine

## 2015-01-10 VITALS — BP 123/74 | HR 63 | Temp 98.3°F | Resp 16 | Ht 69.5 in | Wt 201.0 lb

## 2015-01-10 DIAGNOSIS — N182 Chronic kidney disease, stage 2 (mild): Secondary | ICD-10-CM

## 2015-01-10 DIAGNOSIS — N181 Chronic kidney disease, stage 1: Secondary | ICD-10-CM

## 2015-01-10 DIAGNOSIS — N184 Chronic kidney disease, stage 4 (severe): Secondary | ICD-10-CM

## 2015-01-10 DIAGNOSIS — I129 Hypertensive chronic kidney disease with stage 1 through stage 4 chronic kidney disease, or unspecified chronic kidney disease: Secondary | ICD-10-CM

## 2015-01-10 DIAGNOSIS — I4891 Unspecified atrial fibrillation: Secondary | ICD-10-CM

## 2015-01-10 DIAGNOSIS — N183 Chronic kidney disease, stage 3 (moderate): Secondary | ICD-10-CM

## 2015-01-10 DIAGNOSIS — N185 Chronic kidney disease, stage 5: Secondary | ICD-10-CM

## 2015-01-10 DIAGNOSIS — R32 Unspecified urinary incontinence: Secondary | ICD-10-CM

## 2015-01-10 DIAGNOSIS — D649 Anemia, unspecified: Secondary | ICD-10-CM

## 2015-01-10 DIAGNOSIS — N189 Chronic kidney disease, unspecified: Secondary | ICD-10-CM | POA: Diagnosis not present

## 2015-01-10 DIAGNOSIS — R609 Edema, unspecified: Secondary | ICD-10-CM | POA: Diagnosis not present

## 2015-01-10 DIAGNOSIS — E538 Deficiency of other specified B group vitamins: Secondary | ICD-10-CM

## 2015-01-10 LAB — BASIC METABOLIC PANEL
BUN: 38 mg/dL — AB (ref 6–23)
CALCIUM: 8.4 mg/dL (ref 8.4–10.5)
CO2: 25 mEq/L (ref 19–32)
Chloride: 108 mEq/L (ref 96–112)
Creat: 1.9 mg/dL — ABNORMAL HIGH (ref 0.50–1.35)
GLUCOSE: 102 mg/dL — AB (ref 70–99)
POTASSIUM: 4.7 meq/L (ref 3.5–5.3)
Sodium: 142 mEq/L (ref 135–145)

## 2015-01-10 LAB — CBC WITH DIFFERENTIAL/PLATELET
Basophils Absolute: 0.1 10*3/uL (ref 0.0–0.1)
Basophils Relative: 1 % (ref 0–1)
Eosinophils Absolute: 0.5 10*3/uL (ref 0.0–0.7)
Eosinophils Relative: 5 % (ref 0–5)
HCT: 37 % — ABNORMAL LOW (ref 39.0–52.0)
Hemoglobin: 12.4 g/dL — ABNORMAL LOW (ref 13.0–17.0)
Lymphocytes Relative: 18 % (ref 12–46)
Lymphs Abs: 1.7 10*3/uL (ref 0.7–4.0)
MCH: 31.2 pg (ref 26.0–34.0)
MCHC: 33.5 g/dL (ref 30.0–36.0)
MCV: 93.2 fL (ref 78.0–100.0)
MPV: 9.6 fL (ref 8.6–12.4)
Monocytes Absolute: 0.8 10*3/uL (ref 0.1–1.0)
Monocytes Relative: 9 % (ref 3–12)
Neutro Abs: 6.2 10*3/uL (ref 1.7–7.7)
Neutrophils Relative %: 67 % (ref 43–77)
PLATELETS: 218 10*3/uL (ref 150–400)
RBC: 3.97 MIL/uL — AB (ref 4.22–5.81)
RDW: 13.4 % (ref 11.5–15.5)
WBC: 9.2 10*3/uL (ref 4.0–10.5)

## 2015-01-10 LAB — POCT URINALYSIS DIPSTICK
BILIRUBIN UA: NEGATIVE
GLUCOSE UA: NEGATIVE
KETONES UA: NEGATIVE
Leukocytes, UA: NEGATIVE
Nitrite, UA: NEGATIVE
PH UA: 5
Protein, UA: NEGATIVE
Spec Grav, UA: 1.015
Urobilinogen, UA: 0.2

## 2015-01-10 LAB — POCT UA - MICROSCOPIC ONLY
CASTS, UR, LPF, POC: NEGATIVE
Crystals, Ur, HPF, POC: NEGATIVE
Epithelial cells, urine per micros: NEGATIVE
Mucus, UA: POSITIVE
WBC, UR, HPF, POC: NEGATIVE
Yeast, UA: NEGATIVE

## 2015-01-10 MED ORDER — WARFARIN SODIUM 5 MG PO TABS: ORAL_TABLET | ORAL | Status: DC

## 2015-01-10 NOTE — Progress Notes (Signed)
Subjective:  This chart was scribed for Mark Russian, MD by Ladene Artist, ED Scribe. The patient was seen in room 23. Patient's care was started at 10:28 AM.   Patient ID: Mark Jackson., male    DOB: 1930/05/31, 81 y.o.   MRN: [id number]  Chief Complaint  Patient presents with  . Follow-up  . Hypertension   HPI HPI Comments: Mark Jackson. is a 81 y.o. male, with a h/o HTN, who presents to the [facility] for a follow-up. Pt states that he feels fine for his age. He works 7 hours/day and eats at his desk for lunch.  Cardiology  Pt has noticed chest palpitations over the past few weeks. He denies h/o palpitations. Pt also reports that his BP has been lower than usual. Triage BP today was 123/74 but his BP was 108/68 last week. Additionally, pt has noticed lower extremity swelling but does not wear compression socks, elevate his legs or take a water pill. He has an upcoming appointment with his cardiologist and carotid physician in early July.   R Knee  Pt reports intermittent, moderate R knee pain for the past few weeks. Pt reports that his legs become "tired" more frequently with walking. Pt has had partial knee arthroplasty bilaterally.   Fatigue Pt reports sleeping 10 hours/day. Recently, he has noticed a decrease in exercise which he currently participates in 3 days/week. Pt walks around his neighborhood and at work for 20 minutes for exercise.   Urinary  Pt wears an incontinence pad 24 hours/day. He expresses concerns of kidney function since he now has to change his pad x2 daily. Pt no longer sees a nephrologist but was advised to return if suggested by PCP.  Medications Pt is taking Coumadin 5 mg; has blood work done once a month. Pt has cut back on iron from 7 days a week to 5 days week, which he states has resolved eye burning. Pt last received a B12 injection last week.   Past Medical History  Diagnosis Date  . Essential hypertension   .  Prostate ca   . Left ventricular diastolic dysfunction, NYHA class 08 October 2012    Echocardiogram March 2014: EF 55-60%. Moderate concentric LVH. Grade 1 diastolic dysfunction. Very mild aortic stenosis/sclerosis.   . Anemia   . Carotid artery disease     Right carotid 60-80% stenosis; stable from 2013-2014  . Hyperlipidemia   . Chronic kidney disease (CKD), stage III (moderate) B     Creatinine roughly 1.8-2.0  . Obesity (BMI 30-39.9) 09/03/2013  . Essential hypertension 10/22/2008    Qualifier: Diagnosis of  By: [name] CMA ([place]), Mearl Latin    . Atrial fibrillation, new onset 08/20/2014    Status post TEE cardioversion; 2-D echo: EF 45-50%, (mildly reduced) diffuse hypokinesis, mild concentric LVH, mild aortic stenosis (P/M gradient = 28/16 mmHg)  . Long term current use of anticoagulant therapy 08/27/2014    On warfarin  . Mild aortic stenosis by prior echocardiogram March 2014     Very mild aortic stenosis noted in March 2014, room is mild stenosis in January 2016   Current Outpatient Prescriptions on File Prior to Visit  Medication Sig Dispense Refill  . acetaminophen (TYLENOL) 325 MG tablet Take 325-650 mg by mouth every 6 (six) hours as needed for headache.     Marland Kitchen aspirin 325 MG tablet Take 325 mg by mouth daily.    . baclofen (LIORESAL) 10 MG tablet Take  10 mg by mouth daily as needed (for headache).     . CRESTOR 5 MG tablet TAKE 1 TABLET ONCE DAILY. 90 tablet 3  . ferrous sulfate 325 (65 FE) MG tablet Take 325 mg by mouth daily with breakfast.    . hydrochlorothiazide (MICROZIDE) 12.5 MG capsule Take 1 capsule (12.5 mg total) by mouth daily. 30 capsule 11  . LOPERAMIDE HCL PO Take 2-3 tablets by mouth every 6 (six) hours as needed (diarrhea).    . metoprolol succinate (TOPROL-XL) 50 MG 24 hr tablet TAKE 1 TABLET ONCE DAILY WITH A MEAL. 30 tablet 4  . Multiple Vitamins-Minerals (MULTIVITAMIN WITH MINERALS) tablet Take 1 tablet by mouth daily.    . Tetrahydrozoline HCl (VISINE OP) Apply  1 drop to eye 2 (two) times daily as needed (burning of eyes).    . topiramate (TOPAMAX) 100 MG tablet Take 150 mg by mouth at bedtime.     Marland Kitchen warfarin (COUMADIN) 5 MG tablet TAKE 1 TABLET ONCE DAILY AT 6PM OR AS DIRECTED. 30 tablet 4   Current Facility-Administered Medications on File Prior to Visit  Medication Dose Route Frequency Provider Last Rate Last Dose  . cyanocobalamin ((VITAMIN B-12)) injection 1,000 mcg  1,000 mcg Intramuscular Q30 days Mark Russian, MD   1,000 mcg at 11/30/14 1331  . cyanocobalamin ((VITAMIN B-12)) injection 1,000 mcg  1,000 mcg Intramuscular Q30 days Carol Ada, MD   1,000 mcg at 01/03/15 1352   Allergies  Allergen Reactions  . Plavix [Clopidogrel Bisulfate]     Significant bruising   Review of Systems  Constitutional: Positive for fatigue.  Cardiovascular: Positive for palpitations and leg swelling.  Genitourinary: Positive for frequency.  Musculoskeletal: Positive for arthralgias.   BP 123/74 mmHg  Pulse 63  Temp(Src) 98.3 F (36.8 C)  Resp 16  Ht 5' 9.5" (1.765 m)  Wt 201 lb (91.173 kg)  BMI 29.27 kg/m2  SpO2 98%    Objective:   Physical Exam  Constitutional: He is oriented to person, place, and time. He appears well-developed and well-nourished. No distress.  HENT:  Head: Normocephalic and atraumatic.  Eyes: Conjunctivae and EOM are normal.  Neck: Neck supple. Carotid bruit is not present. No tracheal deviation present.  Cardiovascular: Normal rate, regular rhythm and normal heart sounds.   Pulmonary/Chest: Effort normal and breath sounds normal. No respiratory distress.  Musculoskeletal: Normal range of motion. He exhibits edema (2+ in lower extremities).  Neurological: He is alert and oriented to person, place, and time.  Skin: Skin is warm and dry.  Psychiatric: He has a normal mood and affect. His behavior is normal.  Nursing note and vitals reviewed.     Assessment & Plan:  1. Vitamin B12 deficiency (non anemic) Patient gets  regular B12 shots  2. Atrial fibrillation, unspecified Patient on blood thinners  3. Edema I advised the patient to wear support hose and keep his legs elevated at work.  4. Renal disease, hypertensive benign with chronic kidney disease, stage 1-4 or unspecified chronic kidney disease Repeat be met done today see the status of his renal disease his nephrologist will see him on a when necessary basis - Basic metabolic panel  5. Urinary incontinence, unspecified incontinence type Urine done today. - POCT UA - Microscopic Only - POCT urinalysis dipstick  6. Anemia, unspecified anemia type His anemia has been stable probably more likely related to his renal disease and decreased erythropoietin rather than his B12 deficiency. - CBC with Differential/Platelet   I personally  performed the services described in this documentation, which was scribed in my presence. The recorded information has been reviewed and is accurate.  [name], MD  [facility], [facility]  01/10/2015 1:07 PM

## 2015-01-11 ENCOUNTER — Ambulatory Visit: Payer: BLUE CROSS/BLUE SHIELD | Admitting: Pharmacist Clinician (PhC)/ Clinical Pharmacy Specialist

## 2015-01-11 ENCOUNTER — Ambulatory Visit (INDEPENDENT_AMBULATORY_CARE_PROVIDER_SITE_OTHER): Payer: Medicare Other | Admitting: Pharmacist Clinician (PhC)/ Clinical Pharmacy Specialist

## 2015-01-11 DIAGNOSIS — I4891 Unspecified atrial fibrillation: Secondary | ICD-10-CM | POA: Diagnosis not present

## 2015-01-11 DIAGNOSIS — Z7901 Long term (current) use of anticoagulants: Secondary | ICD-10-CM | POA: Diagnosis not present

## 2015-01-11 LAB — POCT INR: INR: 2.1

## 2015-01-23 ENCOUNTER — Encounter: Payer: Self-pay | Admitting: Gastroenterology

## 2015-01-31 ENCOUNTER — Encounter: Payer: Self-pay | Admitting: *Deleted

## 2015-01-31 ENCOUNTER — Ambulatory Visit (INDEPENDENT_AMBULATORY_CARE_PROVIDER_SITE_OTHER): Payer: Medicare Other | Admitting: *Deleted

## 2015-01-31 VITALS — BP 120/72 | Temp 98.0°F

## 2015-01-31 DIAGNOSIS — E538 Deficiency of other specified B group vitamins: Secondary | ICD-10-CM | POA: Diagnosis not present

## 2015-01-31 NOTE — Progress Notes (Signed)
   Subjective:    Patient ID: Mark Jackson., male    DOB: [birth year], 81 y.o.   MRN: [id number]  HPI  B 12 injection only.   Review of Systems     Objective:   Physical Exam        Assessment & Plan:

## 2015-02-20 ENCOUNTER — Encounter: Payer: Self-pay | Admitting: Cardiology

## 2015-02-20 ENCOUNTER — Ambulatory Visit (INDEPENDENT_AMBULATORY_CARE_PROVIDER_SITE_OTHER): Payer: Medicare Other | Admitting: Cardiovascular Disease

## 2015-02-20 ENCOUNTER — Ambulatory Visit (INDEPENDENT_AMBULATORY_CARE_PROVIDER_SITE_OTHER): Payer: Medicare Other | Admitting: Pharmacist Clinician (PhC)/ Clinical Pharmacy Specialist

## 2015-02-20 ENCOUNTER — Encounter: Payer: Self-pay | Admitting: Cardiovascular Disease

## 2015-02-20 ENCOUNTER — Ambulatory Visit: Payer: Self-pay | Admitting: Cardiology

## 2015-02-20 VITALS — BP 120/62 | HR 58 | Ht 67.0 in | Wt 202.7 lb

## 2015-02-20 DIAGNOSIS — I480 Paroxysmal atrial fibrillation: Secondary | ICD-10-CM

## 2015-02-20 DIAGNOSIS — Z7901 Long term (current) use of anticoagulants: Secondary | ICD-10-CM

## 2015-02-20 DIAGNOSIS — I4891 Unspecified atrial fibrillation: Secondary | ICD-10-CM | POA: Diagnosis not present

## 2015-02-20 DIAGNOSIS — I779 Disorder of arteries and arterioles, unspecified: Secondary | ICD-10-CM | POA: Diagnosis not present

## 2015-02-20 DIAGNOSIS — I739 Peripheral vascular disease, unspecified: Secondary | ICD-10-CM

## 2015-02-20 LAB — POCT INR: INR: 1.9

## 2015-02-20 MED ORDER — DILTIAZEM HCL ER COATED BEADS 120 MG PO CP24: 120.0000 mg | ORAL_CAPSULE | Freq: Every day | ORAL | Status: DC

## 2015-02-20 MED ORDER — METOPROLOL SUCCINATE ER 50 MG PO TB24: 25.0000 mg | ORAL_TABLET | Freq: Every day | ORAL | Status: DC

## 2015-02-20 NOTE — Progress Notes (Signed)
02/20/2015 Mark Patty Jr.   1930/02/18  [id number]  Primary Physician [name], [name], MD Primary Cardiologist: Lorretta Harp MD [name]   HPI:  The patient is a 81 year old mildly overweight married Caucasian male father of 85, grandfather to 16 grandchildren who is a patient of Dr. Allison Quarry and who I see for carotid disease. I saw him 12 months ago. His risk factors include hypertension and mild hyperlipidemia. He has moderately severe right ICA stenosis which has remained stable by duplex ultrasound over the last year. He was on aspirin and Plavix and discontinued the Plavix on his own because of bruising and bleeding. He has had a normal 2D echo and a negative Myoview back 2 year ago. Since I last saw him he has been totally asymptomatic.his last Dopplers were performed one year ago that were stable at that time. He did have an episode of PAF and was cardioverted. He currently is on Coumadin anticoagulation.  Current Outpatient Prescriptions  Medication Sig Dispense Refill  . acetaminophen (TYLENOL) 325 MG tablet Take 325-650 mg by mouth every 6 (six) hours as needed for headache.     Marland Kitchen aspirin 325 MG tablet Take 325 mg by mouth daily.    . baclofen (LIORESAL) 10 MG tablet Take 10 mg by mouth daily as needed (for headache).     . CRESTOR 5 MG tablet TAKE 1 TABLET ONCE DAILY. 90 tablet 3  . ferrous sulfate 325 (65 FE) MG tablet Take 325 mg by mouth daily with breakfast.    . hydrochlorothiazide (MICROZIDE) 12.5 MG capsule Take 1 capsule (12.5 mg total) by mouth daily. 30 capsule 11  . LOPERAMIDE HCL PO Take 2-3 tablets by mouth every 6 (six) hours as needed (diarrhea).    . metoprolol succinate (TOPROL-XL) 50 MG 24 hr tablet TAKE 1 TABLET ONCE DAILY WITH A MEAL. 30 tablet 4  . Multiple Vitamins-Minerals (MULTIVITAMIN WITH MINERALS) tablet Take 1 tablet by mouth daily.    . pseudoephedrine (SUDAFED) 30 MG tablet Take 30 mg by mouth every 4 (four) hours as needed for  congestion.    . Tetrahydrozoline HCl (VISINE OP) Apply 1 drop to eye 2 (two) times daily as needed (burning of eyes).    . topiramate (TOPAMAX) 100 MG tablet Take 150 mg by mouth at bedtime.     Marland Kitchen warfarin (COUMADIN) 5 MG tablet TAKE 1 TABLET ONCE DAILY AT 6PM OR AS DIRECTED. 90 tablet 3   Current Facility-Administered Medications  Medication Dose Route Frequency Provider Last Rate Last Dose  . cyanocobalamin ((VITAMIN B-12)) injection 1,000 mcg  1,000 mcg Intramuscular Q30 days [name], MD   1,000 mcg at 11/30/14 1331  . cyanocobalamin ((VITAMIN B-12)) injection 1,000 mcg  1,000 mcg Intramuscular Q30 days Carol Ada, MD   1,000 mcg at 01/31/15 1251    Allergies  Allergen Reactions  . Plavix [Clopidogrel Bisulfate]     Significant bruising    History   Social History  . Marital Status: Married    Spouse Name: N/A  . Number of Children: N/A  . Years of Education: N/A   Occupational History  . Not on file.   Social History Main Topics  . Smoking status: Former Smoker    Types: Pipe, Landscape architect  . Smokeless tobacco: Never Used  . Alcohol Use: No  . Drug Use: No  . Sexual Activity: Not on file   Other Topics Concern  . Not on file   Social History  Narrative   He is about 81 year old overweight gentleman who is married with 4 children, and 11 grandchildren with 2 great-grandchildren. 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   He used to smoke a pipe for several years but quit about 30 some years ago. He does not drink alcohol.           Review of Systems: General: negative for chills, fever, night sweats or weight changes.  Cardiovascular: negative for chest pain, dyspnea on exertion, edema, orthopnea, palpitations, paroxysmal nocturnal dyspnea or shortness of breath Dermatological: negative for rash Respiratory: negative for cough or wheezing Urologic: negative for hematuria Abdominal: negative for nausea,  vomiting, diarrhea, bright red blood per rectum, melena, or hematemesis Neurologic: negative for visual changes, syncope, or dizziness All other systems reviewed and are otherwise negative except as noted above.    Blood pressure 120/62, pulse 58, height 5\' 7"  (1.702 m), weight 202 lb 11.2 oz (91.944 kg).  General appearance: alert and no distress Neck: no adenopathy, no carotid bruit, no JVD, supple, symmetrical, trachea midline and thyroid not enlarged, symmetric, no tenderness/mass/nodules Lungs: clear to auscultation bilaterally Heart: regular rate and rhythm, S1, S2 normal, no murmur, click, rub or gallop Extremities: extremities normal, atraumatic, no cyanosis or edema  EKG sinus bradycardia 58 with lateral T-wave inversion. I personally reviewed this EKG  ASSESSMENT AND PLAN:   Right-sided carotid artery disease; followed by Dr. Gwenlyn Found History of asymptomatic moderately severe right internal carotid artery stenosis which we have been following by duplex ultrasound. This was last checked one year ago and was in the 70% range. We had a long discussion about CREST 2  Protocol. If the patient progresses to greater than 80% stenosis immediately For this. He is on Coumadin for PAF and I will need to research whether or not this is a exclusion. He would need to be on Plavix for at least one month post stent implantation. I will see him back in one year.      Lorretta Harp MD FACP,FACC,FAHA, [facility] 02/20/2015 9:30 AM

## 2015-02-20 NOTE — Progress Notes (Signed)
PCP: Jenny Reichmann, MD  Clinic Note: Chief Complaint  Patient presents with  . Follow-up     chest pain, shortness of breath, edema, pain in legs, cramping in legs, lightheadedness, dizziness    HPI: Valerie Exposito. is a 81 y.o. male with a PMH below who scheduled to see me today, but was also seen Dr. Quay Burow for his carotid artery disease. He asks if he necessarily needs to be seen for followup today or if he can be seen later. I briefly talked to him about his symptoms and discussed some recommendations they'll like to try prior to seeing him in followup.  Recent Hospitalizations: hospitalized in January found to be in A. Fib.  Studies Reviewed: nuclear stress test and echocardiogram from January  Interval History: He notes that, since January, he has not felt fully back to his and normal self.  He continues to have fatigue and dyspnea. He has overall less energy feeling tired. He is not walking as much as he used to which is upsetting him. 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 He is having some leg cramping at night. He also says that he does intermittently feel abnormal heartbeat, but just not fast.  He denies any chest pressure with rest or exertion. No real PND, orthopnea or edema.  Past Medical History  Diagnosis Date  . Essential hypertension   . Prostate ca   . Left ventricular diastolic dysfunction, NYHA class 08 October 2012    Echocardiogram March 2014: EF 55-60%. Moderate concentric LVH. Grade 1 diastolic dysfunction. Very mild aortic stenosis/sclerosis.   . Anemia   . Carotid artery disease     Right carotid 60-80% stenosis; stable from 2013-2014  . Hyperlipidemia   . Chronic kidney disease (CKD), stage III (moderate) B     Creatinine roughly 1.8-2.0  . Obesity (BMI 30-39.9) 09/03/2013  . Essential hypertension 10/22/2008    Qualifier: Diagnosis of  By: [name] CMA ([place]), Mearl Latin    . Atrial fibrillation, new onset 08/20/2014      Status post TEE cardioversion; 2-D echo: EF 45-50%, (mildly reduced) diffuse hypokinesis, mild concentric LVH, mild aortic stenosis (P/M gradient = 28/16 mmHg)  . Long term current use of anticoagulant therapy 08/27/2014    On warfarin  . Mild aortic stenosis by prior echocardiogram March 2014     Very mild aortic stenosis noted in March 2014, room is mild stenosis in January 2016    Past Surgical History  Procedure Laterality Date  . Partial knee arthroplasty Bilateral   . Tonsillectomy and adenoidectomy    . Nm myoview ltd  February 2012; 09/05/2014    a) 2012: Persantine Myoview: Inferior attenuation artifact but no ischemia or infarction. Normal EF.;; b) 08/2014: 60%. Fixed inferior defect likely diaphragmatic attenuation. LOW RISK.   . Carotid doppler  10/21/2012    Right bulb 50-69% diameter reduction; Right proximal ICA 70-99% diameter reduction; left ICA 0-49% diameter reduction.  . Transthoracic echocardiogram  10/21/2012; 08/21/2014    EF 55-60%. Moderate concentric hypertrophy, grade 1 diastolic dysfunction; b) [id number]: EF 45-50%, Mild concentric LVH, mild AS (p-m gradient 21-16 mmHg),   . Tee without cardioversion N/A 08/22/2014    Procedure: TRANSESOPHAGEAL ECHOCARDIOGRAM (TEE);  Surgeon: Josue Hector, MD;  Location: [facility] ENDOSCOPY;  Service: Cardiovascular;  Laterality: N/A;  . Cardioversion N/A 08/22/2014    Procedure: CARDIOVERSION;  Surgeon: Josue Hector, MD;  Location: [place];  Service:  Cardiovascular;  Laterality: N/A;     ROS: A comprehensive was performed. Review of Systems  Constitutional: Positive for malaise/fatigue.  Respiratory: Positive for shortness of breath.   Cardiovascular: Positive for palpitations.  Gastrointestinal: Positive for abdominal pain and diarrhea. Negative for blood in stool and melena.  Genitourinary: Negative for hematuria.  Endo/Heme/Allergies: Bruises/bleeds easily.  Psychiatric/Behavioral:       Quite upset factor his daughter  quit the family company after an argument between the 2 of them this past January. He thinks this is what led to his admission with A. Fib.  All other systems reviewed and are negative.   Current Outpatient Prescriptions on File Prior to Visit  Medication Sig Dispense Refill  . acetaminophen (TYLENOL) 325 MG tablet Take 325-650 mg by mouth every 6 (six) hours as needed for headache.     Marland Kitchen aspirin 325 MG tablet Take 325 mg by mouth daily.    . baclofen (LIORESAL) 10 MG tablet Take 10 mg by mouth daily as needed (for headache).     . CRESTOR 5 MG tablet TAKE 1 TABLET ONCE DAILY. 90 tablet 3  . ferrous sulfate 325 (65 FE) MG tablet Take 325 mg by mouth daily with breakfast.    . hydrochlorothiazide (MICROZIDE) 12.5 MG capsule Take 1 capsule (12.5 mg total) by mouth daily. 30 capsule 11  . LOPERAMIDE HCL PO Take 2-3 tablets by mouth every 6 (six) hours as needed (diarrhea).    . Multiple Vitamins-Minerals (MULTIVITAMIN WITH MINERALS) tablet Take 1 tablet by mouth daily.    . Tetrahydrozoline HCl (VISINE OP) Apply 1 drop to eye 2 (two) times daily as needed (burning of eyes).    . topiramate (TOPAMAX) 100 MG tablet Take 150 mg by mouth at bedtime.     Marland Kitchen warfarin (COUMADIN) 5 MG tablet TAKE 1 TABLET ONCE DAILY AT 6PM OR AS DIRECTED. 90 tablet 3   Current Facility-Administered Medications on File Prior to Visit  Medication Dose Route Frequency Provider Last Rate Last Dose  . cyanocobalamin ((VITAMIN B-12)) injection 1,000 mcg  1,000 mcg Intramuscular Q30 days [name], MD   1,000 mcg at 11/30/14 1331  . cyanocobalamin ((VITAMIN B-12)) injection 1,000 mcg  1,000 mcg Intramuscular Q30 days Carol Ada, MD   1,000 mcg at 01/31/15 1251   Allergies  Allergen Reactions  . Plavix [Clopidogrel Bisulfate]     Significant bruising     History   Social History  . Marital Status: Married    Spouse Name: N/A  . Number of Children: N/A  . Years of Education: N/A   Occupational History  . Not  on file.   Social History Main Topics  . Smoking status: Former Smoker    Types: Pipe, Landscape architect  . Smokeless tobacco: Never Used  . Alcohol Use: No  . Drug Use: No  . Sexual Activity: Not on file   Other Topics Concern  . 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 He is the Namesake date [employer] and the family of [employer] which is a local [place].   He used to smoke a pipe for several years but quit about 30 some years ago. He does not drink alcohol.          Wt Readings from Last 3 Encounters:  02/20/15 91.944 kg (202 lb 11.2 oz)  01/10/15 91.173 kg (201 lb)  10/08/14 91.354 kg (201 lb 6.4 oz)    ASSESSMENT / PLAN: Problem List Items Addressed This Visit    Paroxysmal atrial fibrillation - Primary (Chronic)    We talked about concerns for his energy potentially being related to his beta blocker. Plan is to reduce Toprol to 25 mg each bedtime and then start diltiazem 120 mg in the a.m. Starting tomorrow. He will continue with him for about a month a half to 2 months and we'll see him back in followup. We will then determine whether or not we need to do monitor her to evaluate for A. Fib burden. We may need to consider potentially antiarrhythmic therapy.  Thankfully, he is anticoagulated.         [name], M.D., M.S. Interventional Cardiologist   Pager # [phone]

## 2015-02-20 NOTE — Assessment & Plan Note (Signed)
History of asymptomatic moderately severe right internal carotid artery stenosis which we have been following by duplex ultrasound. This was last checked one year ago and was in the 70% range. We had a long discussion about CREST 2  Protocol. If the patient progresses to greater than 80% stenosis immediately For this. He is on Coumadin for PAF and I will need to research whether or not this is a exclusion. He would need to be on Plavix for at least one month post stent implantation. I will see him back in one year.

## 2015-02-20 NOTE — Patient Instructions (Addendum)
Your physician has requested that you have a carotid duplex now and repeat in 6 months. This test is an ultrasound of the carotid arteries in your neck. It looks at blood flow through these arteries that supply the brain with blood. Allow one hour for this exam. There are no restrictions or special instructions.  Dr Gwenlyn Found recommends that you schedule a follow-up appointment in 1 year. You will receive a reminder letter in the mail two months in advance. If you don't receive a letter, please call our office to schedule the follow-up appointment.   PER DR HARDING--FOR YOUR FATIGUE  DECREASE METOPROLOL TO 25 MG DAILY TAKE AT BEDTIME  START DILTIAZEM  CD 120 MG TAKE DAILY IN THE MORNING.  Your physician recommends that you schedule a FOLLOW UP IN 1- 2 MONTHS (SEPT  IS FINE) WITH DR HARDING.

## 2015-02-20 NOTE — Assessment & Plan Note (Signed)
We talked about concerns for his energy potentially being related to his beta blocker. Plan is to reduce Toprol to 25 mg each bedtime and then start diltiazem 120 mg in the a.m. Starting tomorrow. He will continue with him for about a month a half to 2 months and we'll see him back in followup. We will then determine whether or not we need to do monitor her to evaluate for A. Fib burden. We may need to consider potentially antiarrhythmic therapy.  Thankfully, he is anticoagulated.

## 2015-02-22 ENCOUNTER — Ambulatory Visit (INDEPENDENT_AMBULATORY_CARE_PROVIDER_SITE_OTHER): Payer: Medicare Other

## 2015-02-22 DIAGNOSIS — E539 Vitamin B deficiency, unspecified: Secondary | ICD-10-CM

## 2015-02-22 NOTE — Progress Notes (Signed)
   Subjective:    Patient ID: Mark Jackson., male    DOB: 1930/02/14, 81 y.o.   MRN: [id number]  HPI  Patient here for vitamin B injection only.  Review of Systems     Objective:   Physical Exam        Assessment & Plan:

## 2015-03-03 ENCOUNTER — Telehealth: Payer: Self-pay | Admitting: Family Medicine

## 2015-03-03 NOTE — Telephone Encounter (Signed)
lmom to call and reschedule appt that he had with daub on 06/06/15

## 2015-03-05 ENCOUNTER — Ambulatory Visit (HOSPITAL_COMMUNITY)
Admission: RE | Admit: 2015-03-05 | Discharge: 2015-03-05 | Disposition: A | Discharge status: Home / Self Care | Payer: Medicare Other | Source: Ambulatory Visit | Attending: Cardiology | Admitting: Cardiology

## 2015-03-05 DIAGNOSIS — I6523 Occlusion and stenosis of bilateral carotid arteries: Secondary | ICD-10-CM | POA: Insufficient documentation

## 2015-03-05 DIAGNOSIS — I739 Peripheral vascular disease, unspecified: Secondary | ICD-10-CM

## 2015-03-11 DIAGNOSIS — I252 Old myocardial infarction: Secondary | ICD-10-CM

## 2015-03-11 HISTORY — DX: Old myocardial infarction: I25.2

## 2015-03-12 ENCOUNTER — Telehealth: Payer: Self-pay | Admitting: Cardiology

## 2015-03-12 NOTE — Telephone Encounter (Signed)
Will forward to pharm md for clearance

## 2015-03-12 NOTE — Telephone Encounter (Signed)
Spoke with deanna, note faxed to [phone].

## 2015-03-12 NOTE — Progress Notes (Signed)
Dopplers performed on 03/05/15 showed mildly severe right ICA stenosis probably in the 70-75% range. The patient and I discussed CREST 2  protocol and I gave him material to review on this. We decided to follow this by noninvasive testing and will readdress this after his next Doppler study if he progresses to 80% or greater.

## 2015-03-12 NOTE — Telephone Encounter (Signed)
Pt is having a lumbar spinal injection,wants to know if he can stop his comadin 5 days before? Please fax this [phone]

## 2015-03-12 NOTE — Telephone Encounter (Signed)
Reviewed chart, pt has CHADS2 score of 3.  Ok to hold warfarin x 5 days for spinal injection.  Restart evening of or day after procedure, at discretion of Dr. Nelva Bush.  Per protocol -

## 2015-03-13 ENCOUNTER — Encounter (HOSPITAL_COMMUNITY): Payer: Self-pay

## 2015-03-13 ENCOUNTER — Emergency Department (HOSPITAL_COMMUNITY): Payer: Medicare Other

## 2015-03-13 ENCOUNTER — Observation Stay (HOSPITAL_COMMUNITY)
Admission: EM | Admit: 2015-03-13 | Discharge: 2015-03-15 | Disposition: A | Discharge status: Home / Self Care | Payer: Medicare Other | Attending: Internal Medicine | Admitting: Internal Medicine

## 2015-03-13 DIAGNOSIS — G8929 Other chronic pain: Secondary | ICD-10-CM | POA: Diagnosis not present

## 2015-03-13 DIAGNOSIS — Z7901 Long term (current) use of anticoagulants: Secondary | ICD-10-CM | POA: Insufficient documentation

## 2015-03-13 DIAGNOSIS — R072 Precordial pain: Secondary | ICD-10-CM | POA: Diagnosis not present

## 2015-03-13 DIAGNOSIS — I4891 Unspecified atrial fibrillation: Secondary | ICD-10-CM | POA: Diagnosis not present

## 2015-03-13 DIAGNOSIS — I129 Hypertensive chronic kidney disease with stage 1 through stage 4 chronic kidney disease, or unspecified chronic kidney disease: Secondary | ICD-10-CM | POA: Diagnosis not present

## 2015-03-13 DIAGNOSIS — G43909 Migraine, unspecified, not intractable, without status migrainosus: Secondary | ICD-10-CM | POA: Insufficient documentation

## 2015-03-13 DIAGNOSIS — I5032 Chronic diastolic (congestive) heart failure: Secondary | ICD-10-CM

## 2015-03-13 DIAGNOSIS — R011 Cardiac murmur, unspecified: Secondary | ICD-10-CM | POA: Diagnosis not present

## 2015-03-13 DIAGNOSIS — I519 Heart disease, unspecified: Secondary | ICD-10-CM | POA: Diagnosis not present

## 2015-03-13 DIAGNOSIS — N183 Chronic kidney disease, stage 3 (moderate): Secondary | ICD-10-CM | POA: Insufficient documentation

## 2015-03-13 DIAGNOSIS — R079 Chest pain, unspecified: Secondary | ICD-10-CM | POA: Diagnosis not present

## 2015-03-13 DIAGNOSIS — Z85828 Personal history of other malignant neoplasm of skin: Secondary | ICD-10-CM | POA: Diagnosis not present

## 2015-03-13 DIAGNOSIS — Z87891 Personal history of nicotine dependence: Secondary | ICD-10-CM | POA: Diagnosis not present

## 2015-03-13 DIAGNOSIS — D649 Anemia, unspecified: Secondary | ICD-10-CM | POA: Diagnosis not present

## 2015-03-13 DIAGNOSIS — E785 Hyperlipidemia, unspecified: Secondary | ICD-10-CM | POA: Diagnosis not present

## 2015-03-13 DIAGNOSIS — I779 Disorder of arteries and arterioles, unspecified: Secondary | ICD-10-CM | POA: Insufficient documentation

## 2015-03-13 DIAGNOSIS — Z8546 Personal history of malignant neoplasm of prostate: Secondary | ICD-10-CM | POA: Insufficient documentation

## 2015-03-13 DIAGNOSIS — Z8679 Personal history of other diseases of the circulatory system: Secondary | ICD-10-CM

## 2015-03-13 DIAGNOSIS — I480 Paroxysmal atrial fibrillation: Secondary | ICD-10-CM | POA: Diagnosis present

## 2015-03-13 DIAGNOSIS — R609 Edema, unspecified: Secondary | ICD-10-CM | POA: Diagnosis present

## 2015-03-13 DIAGNOSIS — N184 Chronic kidney disease, stage 4 (severe): Secondary | ICD-10-CM | POA: Diagnosis present

## 2015-03-13 DIAGNOSIS — I350 Nonrheumatic aortic (valve) stenosis: Secondary | ICD-10-CM

## 2015-03-13 DIAGNOSIS — E669 Obesity, unspecified: Secondary | ICD-10-CM | POA: Diagnosis not present

## 2015-03-13 DIAGNOSIS — M1990 Unspecified osteoarthritis, unspecified site: Secondary | ICD-10-CM | POA: Insufficient documentation

## 2015-03-13 DIAGNOSIS — I10 Essential (primary) hypertension: Secondary | ICD-10-CM | POA: Diagnosis present

## 2015-03-13 DIAGNOSIS — I5189 Other ill-defined heart diseases: Secondary | ICD-10-CM | POA: Diagnosis present

## 2015-03-13 HISTORY — DX: Low back pain: M54.5

## 2015-03-13 HISTORY — DX: Headache: R51

## 2015-03-13 HISTORY — DX: Unspecified malignant neoplasm of skin, unspecified: C44.90

## 2015-03-13 HISTORY — DX: Migraine, unspecified, not intractable, without status migrainosus: G43.909

## 2015-03-13 HISTORY — DX: Other chronic pain: G89.29

## 2015-03-13 HISTORY — DX: Unspecified osteoarthritis, unspecified site: M19.90

## 2015-03-13 HISTORY — DX: Malignant neoplasm of prostate: C61

## 2015-03-13 HISTORY — DX: Low back pain, unspecified: M54.50

## 2015-03-13 LAB — BASIC METABOLIC PANEL
Anion gap: 6 (ref 5–15)
BUN: 46 mg/dL — ABNORMAL HIGH (ref 6–20)
CO2: 24 mmol/L (ref 22–32)
Calcium: 8.5 mg/dL — ABNORMAL LOW (ref 8.9–10.3)
Chloride: 112 mmol/L — ABNORMAL HIGH (ref 101–111)
Creatinine, Ser: 2.37 mg/dL — ABNORMAL HIGH (ref 0.61–1.24)
GFR calc Af Amer: 28 mL/min — ABNORMAL LOW (ref >60)
GFR, EST NON AFRICAN AMERICAN: 24 mL/min — AB (ref >60)
Glucose, Bld: 89 mg/dL (ref 65–99)
Potassium: 4.4 mmol/L (ref 3.5–5.1)
SODIUM: 142 mmol/L (ref 135–145)

## 2015-03-13 LAB — CBC
HCT: 36.6 % — ABNORMAL LOW (ref 39.0–52.0)
HEMOGLOBIN: 12.1 g/dL — AB (ref 13.0–17.0)
MCH: 32.4 pg (ref 26.0–34.0)
MCHC: 33.1 g/dL (ref 30.0–36.0)
MCV: 97.9 fL (ref 78.0–100.0)
PLATELETS: 193 10*3/uL (ref 150–400)
RBC: 3.74 MIL/uL — ABNORMAL LOW (ref 4.22–5.81)
RDW: 13 % (ref 11.5–15.5)
WBC: 10.1 10*3/uL (ref 4.0–10.5)

## 2015-03-13 LAB — I-STAT TROPONIN, ED: TROPONIN I, POC: 0.01 ng/mL (ref 0.00–0.08)

## 2015-03-13 LAB — PROTIME-INR
INR: 2.31 — AB (ref 0.00–1.49)
PROTHROMBIN TIME: 25.2 s — AB (ref 11.6–15.2)

## 2015-03-13 LAB — TROPONIN I: Troponin I: 0.06 ng/mL — ABNORMAL HIGH (ref <0.031)

## 2015-03-13 MED ORDER — ROSUVASTATIN CALCIUM 10 MG PO TABS
5.0000 mg | ORAL_TABLET | Freq: Every day | ORAL | Status: DC
  Administered 2015-03-13 – 2015-03-15 (×3): 5 mg via ORAL
  Filled 2015-03-13 (×3): qty 1

## 2015-03-13 MED ORDER — DILTIAZEM HCL ER COATED BEADS 120 MG PO CP24
120.0000 mg | ORAL_CAPSULE | Freq: Every day | ORAL | Status: DC
  Administered 2015-03-14 – 2015-03-15 (×2): 120 mg via ORAL
  Filled 2015-03-13 (×3): qty 1

## 2015-03-13 MED ORDER — ASPIRIN 81 MG PO CHEW
81.0000 mg | CHEWABLE_TABLET | Freq: Every day | ORAL | Status: DC
Start: 2015-03-13 — End: 2015-03-15
  Administered 2015-03-13 – 2015-03-14 (×2): 81 mg via ORAL
  Filled 2015-03-13 (×4): qty 1

## 2015-03-13 MED ORDER — WARFARIN SODIUM 5 MG PO TABS
5.0000 mg | ORAL_TABLET | ORAL | Status: DC
  Administered 2015-03-13: 5 mg via ORAL
  Filled 2015-03-13: qty 1

## 2015-03-13 MED ORDER — WARFARIN SODIUM 2.5 MG PO TABS: 2.5000 mg | ORAL_TABLET | ORAL | Status: DC

## 2015-03-13 MED ORDER — METOPROLOL SUCCINATE ER 25 MG PO TB24
25.0000 mg | ORAL_TABLET | Freq: Every day | ORAL | Status: DC
  Administered 2015-03-13 – 2015-03-14 (×2): 25 mg via ORAL
  Filled 2015-03-13 (×2): qty 1

## 2015-03-13 MED ORDER — TOPIRAMATE 25 MG PO TABS
150.0000 mg | ORAL_TABLET | Freq: Every day | ORAL | Status: DC
  Administered 2015-03-13 – 2015-03-14 (×2): 150 mg via ORAL
  Filled 2015-03-13 (×3): qty 2

## 2015-03-13 MED ORDER — FERROUS SULFATE 325 (65 FE) MG PO TABS
325.0000 mg | ORAL_TABLET | Freq: Every day | ORAL | Status: DC
  Administered 2015-03-14 – 2015-03-15 (×2): 325 mg via ORAL
  Filled 2015-03-13 (×2): qty 1

## 2015-03-13 MED ORDER — ACETAMINOPHEN 325 MG PO TABS
325.0000 mg | ORAL_TABLET | Freq: Four times a day (QID) | ORAL | Status: DC | PRN
  Administered 2015-03-14: 650 mg via ORAL
  Filled 2015-03-13: qty 2

## 2015-03-13 MED ORDER — ONDANSETRON HCL 4 MG/2ML IJ SOLN: 4.0000 mg | Freq: Four times a day (QID) | INTRAMUSCULAR | Status: DC | PRN

## 2015-03-13 MED ORDER — WARFARIN - PHARMACIST DOSING INPATIENT: Freq: Every day | Status: DC

## 2015-03-13 MED ORDER — BACLOFEN 10 MG PO TABS
10.0000 mg | ORAL_TABLET | Freq: Every day | ORAL | Status: DC | PRN
  Administered 2015-03-14: 10 mg via ORAL
  Filled 2015-03-13: qty 1

## 2015-03-13 NOTE — Progress Notes (Signed)
ANTICOAGULATION CONSULT NOTE - Initial Consult  Pharmacy Consult for Coumadin Indication: atrial fibrillation  Allergies  Allergen Reactions  . Plavix [Clopidogrel Bisulfate]     Significant bruising    Patient Measurements: Height: 5\' 8"  (172.7 cm) Weight: 199 lb 8 oz (90.493 kg) IBW/kg (Calculated) : 68.4  Vital Signs: Temp: 97.6 F (36.4 C) (08/03 2026) Temp Source: Oral (08/03 2026) BP: 167/86 mmHg (08/03 2026) Pulse Rate: 62 (08/03 2026)  Labs:  Recent Labs  03/13/15 1607  HGB 12.1*  HCT 36.6*  PLT 193  LABPROT 25.2*  INR 2.31*  CREATININE 2.37*    Estimated Creatinine Clearance: 26.2 mL/min (by C-G formula based on Cr of 2.37).   Medical History: Past Medical History  Diagnosis Date  . Essential hypertension   . Prostate ca   . Left ventricular diastolic dysfunction, NYHA class 08 October 2012    Echocardiogram March 2014: EF 55-60%. Moderate concentric LVH. Grade 1 diastolic dysfunction. Very mild aortic stenosis/sclerosis.   . Anemia   . Carotid artery disease     Right carotid 60-80% stenosis; stable from 2013-2014  . Hyperlipidemia   . Chronic kidney disease (CKD), stage III (moderate) B     Creatinine roughly 1.8-2.0  . Obesity (BMI 30-39.9) 09/03/2013  . Essential hypertension 10/22/2008    Qualifier: Diagnosis of  By: [name] CMA ([place]), Mearl Latin    . Atrial fibrillation, new onset 08/20/2014    Status post TEE cardioversion; 2-D echo: EF 45-50%, (mildly reduced) diffuse hypokinesis, mild concentric LVH, mild aortic stenosis (P/M gradient = 28/16 mmHg)  . Long term current use of anticoagulant therapy 08/27/2014    On warfarin  . Mild aortic stenosis by prior echocardiogram March 2014     Very mild aortic stenosis noted in March 2014, room is mild stenosis in January 2016     Assessment: 81yo male presents to ED via EMS c/o CP.  Pt reports headache earlier today >> took Sudafed >> developed sharp, central CP lasting about 45min.  Pt on Coumadin PTA  for Afib.  Pharmacy consulted to dose Coumadin while admitted.  INR on admit therapeutic at 2.31, H&H slightly low but stable from previous, PLT wnl, no bleeding noted.  PTA dose: 2.5mg  on Fridays, 5mg  all other days (per Coumadin Clinic note 7/13) Pt has been stable and therapeutic on this regimen since March 2016  Goal of Therapy:  INR 2-3 Monitor platelets by anticoagulation protocol: Yes   Plan:  -Resume PTA dosing - 2.5mg  on Fridays, 5mg  AOD -Monitor Q72hr INR, CBC, s/sx of bleeding  Drucie Opitz, PharmD Clinical Pharmacist Pager: [phone] 03/13/2015 9:40 PM

## 2015-03-13 NOTE — Progress Notes (Addendum)
NP Baltazar Najjar notified of 0.06 troponin. VSS, pt not c/o pain. Will continue to monitor closely.

## 2015-03-13 NOTE — H&P (Signed)
History and Physical  Mark Jackson. [id number] DOB: 06-Nov-1929 DOA: 03/13/2015  Referring physician: Kirstie Jackson, ER resident PCP: Mark Reichmann, MD   Chief Complaint: Chest pain  HPI: Mark Jackson. is a 81 y.o. male  With past medical history of chronic kidney disease stage III and chronic systolic/diastolic heart failure who was in his usual state of health when he started getting a headache, a frequent problem for the patient. He took several of his chronic headache medications including Sudafed and then started experiencing sudden chest pain. Patient described the chest pain as if and will was across his chest with associated shortness of breath but no nausea, dizziness, sweating or left arm numbness. The heaviness did not radiate elsewhere. This resolved after approximately 15 minutes without any kind of intervention on the patient's behalf. There was no residual pain, numbness or heaviness. No symptoms by the time the patient came to the emergency room. In the emergency room, EKG was unremarkable as were troponins. Given heart score of 5, it was felt best the patient commended for further observation. Hospitalists were called.   Review of Systems:  Patient seen after arrival to floor Pt currently with no complaints  Pt denies any headache, vision changes, dysphagia, chest pain, palpitations, shortness of breath, wheeze, cough, abdominal pain, hematuria, dysuria, constipation, diarrhea, focal extremity numbness weakness or pain.  Review of systems are otherwise negative  Past Medical History  Diagnosis Date  . Essential hypertension   . Prostate ca   . Left ventricular diastolic dysfunction, NYHA class 08 October 2012    Echocardiogram March 2014: EF 55-60%. Moderate concentric LVH. Grade 1 diastolic dysfunction. Very mild aortic stenosis/sclerosis.   . Anemia   . Carotid artery disease     Right carotid 60-80% stenosis; stable from 2013-2014  . Hyperlipidemia   .  Chronic kidney disease (CKD), stage III (moderate) B     Creatinine roughly 1.8-2.0  . Obesity (BMI 30-39.9) 09/03/2013  . Essential hypertension 10/22/2008    Qualifier: Diagnosis of  By: [name] CMA ([place]), Mearl Latin    . Atrial fibrillation, new onset 08/20/2014    Status post TEE cardioversion; 2-D echo: EF 45-50%, (mildly reduced) diffuse hypokinesis, mild concentric LVH, mild aortic stenosis (P/M gradient = 28/16 mmHg)  . Long term current use of anticoagulant therapy 08/27/2014    On warfarin  . Mild aortic stenosis by prior echocardiogram March 2014     Very mild aortic stenosis noted in March 2014, room is mild stenosis in January 2016   Past Surgical History  Procedure Laterality Date  . Partial knee arthroplasty Bilateral   . Tonsillectomy and adenoidectomy    . Nm myoview ltd  February 2012; 09/05/2014    a) 2012: Persantine Myoview: Inferior attenuation artifact but no ischemia or infarction. Normal EF.;; b) 08/2014: 60%. Fixed inferior defect likely diaphragmatic attenuation. LOW RISK.   . Carotid doppler  10/21/2012    Right bulb 50-69% diameter reduction; Right proximal ICA 70-99% diameter reduction; left ICA 0-49% diameter reduction.  . Transthoracic echocardiogram  10/21/2012; 08/21/2014    EF 55-60%. Moderate concentric hypertrophy, grade 1 diastolic dysfunction; b) XX123456: EF 45-50%, Mild concentric LVH, mild AS (p-m gradient 21-16 mmHg),   . Tee without cardioversion N/A 08/22/2014    Procedure: TRANSESOPHAGEAL ECHOCARDIOGRAM (TEE);  Surgeon: Josue Hector, MD;  Location: [facility] ENDOSCOPY;  Service: Cardiovascular;  Laterality: N/A;  . Cardioversion N/A 08/22/2014    Procedure: CARDIOVERSION;  Surgeon: Josue Hector, MD;  Location: [facility] ENDOSCOPY;  Service: Cardiovascular;  Laterality: N/A;   Social History:  reports that he has quit smoking. His smoking use included Pipe and Cigars. He has never used smokeless tobacco. He reports that he does not drink alcohol or use illicit  drugs. Patient lives at home with his wife & is able to participate in activities of daily living with out use of a walker or cane. He does get slightly winded and short of breath when climbing stairs, but is otherwise able to ambulate comfortably without chest pain or shortness of breath  Allergies  Allergen Reactions  . Plavix [Clopidogrel Bisulfate]     Significant bruising    Family History  Problem Relation Age of Onset  . Cancer Mother     ovarin cancer      Prior to Admission medications   Medication Sig Start Date End Date Taking? Authorizing Provider  acetaminophen (TYLENOL) 325 MG tablet Take 325-650 mg by mouth every 6 (six) hours as needed for headache.    Yes Historical Provider, MD  aspirin 325 MG tablet Take 325 mg by mouth daily as needed for headache.    Yes Historical Provider, MD  aspirin 81 MG tablet Take 81 mg by mouth at bedtime.   Yes Historical Provider, MD  baclofen (LIORESAL) 10 MG tablet Take 10 mg by mouth daily as needed (for headache).    Yes Historical Provider, MD  CRESTOR 5 MG tablet TAKE 1 TABLET ONCE DAILY. 03/23/14  Yes Lorretta Harp, MD  diltiazem (CARDIZEM CD) 120 MG 24 hr capsule Take 1 capsule (120 mg total) by mouth daily. 02/20/15  Yes [name], MD  ferrous sulfate 325 (65 FE) MG tablet Take 325 mg by mouth daily with breakfast.   Yes Historical Provider, MD  hydrochlorothiazide (MICROZIDE) 12.5 MG capsule Take 1 capsule (12.5 mg total) by mouth daily. 05/15/14  Yes [name], MD  LOPERAMIDE HCL PO Take 2-3 tablets by mouth every 6 (six) hours as needed (diarrhea).   Yes Historical Provider, MD  metoprolol succinate (TOPROL-XL) 50 MG 24 hr tablet Take 1 tablet (50 mg total) by mouth at bedtime. Take with or immediately following a meal. 02/20/15  Yes [name], MD  Multiple Vitamins-Minerals (MULTIVITAMIN WITH MINERALS) tablet Take 1 tablet by mouth daily.   Yes Historical Provider, MD  pseudoephedrine (SUDAFED) 30 MG tablet Take  30 mg by mouth every 4 (four) hours as needed for congestion.   Yes Historical Provider, MD  Tetrahydrozoline HCl (VISINE OP) Apply 1 drop to eye 2 (two) times daily as needed (burning of eyes).   Yes Historical Provider, MD  topiramate (TOPAMAX) 100 MG tablet Take 150 mg by mouth at bedtime.    Yes Historical Provider, MD  warfarin (COUMADIN) 5 MG tablet TAKE 1 TABLET ONCE DAILY AT 6PM OR AS DIRECTED. 01/10/15  Yes [name], MD    Physical Exam: BP 167/86 mmHg  Pulse 62  Temp(Src) 97.6 F (36.4 C) (Oral)  Resp 18  Ht 5\' 8"  (1.727 m)  Wt 90.493 kg (199 lb 8 oz)  BMI 30.34 kg/m2  SpO2 100%  General:  Alert and oriented 3, no acute distress Eyes: Sclera nonicteric, extraocular movements are intact ENT: Normocephalic, atraumatic, mucous membranes are moist Neck: No carotid bruits Cardiovascular: Soft, regular rate and rhythm, S1-S2 Respiratory: Clear to auscultation bilaterally Abdomen: Soft, nontender, nondistended, positive bowel sounds Skin: No skin breaks, tears or lesions Musculoskeletal: 2+ pitting edema at the calves  Psychiatric: Patient is appropriate, no evidence of psychoses Neurologic: No focal deficits           Labs on Admission:  Basic Metabolic Panel:  Recent Labs Lab 03/13/15 1607  NA 142  K 4.4  CL 112*  CO2 24  GLUCOSE 89  BUN 46*  CREATININE 2.37*  CALCIUM 8.5*   Liver Function Tests: No results for input(s): AST, ALT, ALKPHOS, BILITOT, PROT, ALBUMIN in the last 168 hours. No results for input(s): LIPASE, AMYLASE in the last 168 hours. No results for input(s): AMMONIA in the last 168 hours. CBC:  Recent Labs Lab 03/13/15 1607  WBC 10.1  HGB 12.1*  HCT 36.6*  MCV 97.9  PLT 193   Cardiac Enzymes: No results for input(s): CKTOTAL, CKMB, CKMBINDEX, TROPONINI in the last 168 hours.  BNP (last 3 results)  Recent Labs  08/20/14 1138  BNP 416.9*    ProBNP (last 3 results) No results for input(s): PROBNP in the last 8760  hours.  CBG: No results for input(s): GLUCAP in the last 168 hours.  Radiological Exams on Admission: Dg Chest 2 View  03/13/2015   CLINICAL DATA:  Acute onset chest pain earlier today. Atrial fibrillation.  EXAM: CHEST  2 VIEW  COMPARISON:  08/20/2014  FINDINGS: The heart size and mediastinal contours are within normal limits. Both lungs are clear. No evidence of pleural effusion. Mild thoracic spine degenerative disc disease noted.  IMPRESSION: No active cardiopulmonary disease.   Electronically Signed   By: Earle Gell M.D.   On: 03/13/2015 16:31    EKG: Independently reviewed. Sinus rhythm with some low voltage  Assessment/Plan Present on Admission:  . Chest pain, rule out acute myocardial infarction: Patient with heart score of 5, moderate story and history of atherosclerotic disease. We'll plan to cycle enzymes 3 and if negative, patient could go home with outpatient follow-up  . Paroxysmal atrial fibrillation on chronic anticoagulation: INR therapeutic, pharmacy to manage. Currently in normal sinus rhythm.  . Obesity (BMI 30-39.9): Patient meets criteria with BMI greater than 30  . Edema: Chronic.  . CKD (chronic kidney disease) stage 3, GFR 30-59 ml/min: Some mild drop in GFR, minimal dehydration. We'll hold HCTZ  . Essential hypertension: Stable. Continue home meds.  . Chronic combined systolic and diastolic heart failure: Looks to be euvolemic. Echocardiogram done in January of this year noted mild systolic dysfunction and unable to evaluate diastolic function  . Hyperlipidemia: Continue statin.    Consultants: None  Code Status: Full code as confirmed by patient  Family Communication: Wife not present   Disposition Plan: Likely discharge tomorrow as long as troponins negative  Time spent: 40 minutes  [place] Pager [phone]

## 2015-03-13 NOTE — ED Provider Notes (Signed)
History   Chief Complaint  Patient presents with  . Chest Pain    HPI 81 year old Jackson past history as below notable for hypertension, carotid artery disease, chronic kidney disease, A. fib on warfarin, recent myocardial perfusion scan which was normal in January chest and 16 who presents to ED for evaluation of chest pain that began today. Patient reports he was at work and having his usual day when he gradually began to have chest heaviness. Pain was located over his mid sternum. Pain did not radiate. He also reports having associated symptoms of mild shortness of breath which was not significant change from his baseline and some mild diaphoresis. This lasted for 20 minutes before resolving spontaneously with rest. Patient denied any modifying factors. Patient took an aspirin 324 mg and EMS was called. Upon EMS arrival she was without chest pain and hemodynamically stable and in no apparent distress.  Patient says prior to this he was in his usual state of health and had no recent illness, hospitalizations. Denies any leg pain or leg swelling worse than his baseline. Denies any history of DVT/PE. No fever or cough. No other complaints.  Past medical/surgical history, social history, medications, allergies and FH have been reviewed with patient and/or in documentation.  Past Medical History  Diagnosis Date  . Essential hypertension   . Prostate ca   . Left ventricular diastolic dysfunction, NYHA class 08 October 2012    Echocardiogram March 2014: EF 55-60%. Moderate concentric LVH. Grade 1 diastolic dysfunction. Very mild aortic stenosis/sclerosis.   . Anemia   . Carotid artery disease     Right carotid 60-80% stenosis; stable from 2013-2014  . Hyperlipidemia   . Chronic kidney disease (CKD), stage III (moderate) B     Creatinine roughly 1.8-2.0  . Obesity (BMI 30-39.9) 09/03/2013  . Essential hypertension 10/22/2008    Qualifier: Diagnosis of  By: [name] CMA ([place]), Mearl Latin    . Atrial  fibrillation, new onset 08/20/2014    Status post TEE cardioversion; 2-D echo: EF 45-50%, (mildly reduced) diffuse hypokinesis, mild concentric LVH, mild aortic stenosis (P/M gradient = 28/16 mmHg)  . Long term current use of anticoagulant therapy 08/27/2014    On warfarin  . Mild aortic stenosis by prior echocardiogram March 2014     Very mild aortic stenosis noted in March 2014, room is mild stenosis in January 2016   Past Surgical History  Procedure Laterality Date  . Partial knee arthroplasty Bilateral   . Tonsillectomy and adenoidectomy    . Nm myoview ltd  February 2012; 09/05/2014    a) 2012: Persantine Myoview: Inferior attenuation artifact but no ischemia or infarction. Normal EF.;; b) 08/2014: 60%. Fixed inferior defect likely diaphragmatic attenuation. LOW RISK.   . Carotid doppler  10/21/2012    Right bulb 50-69% diameter reduction; Right proximal ICA 70-99% diameter reduction; left ICA 0-49% diameter reduction.  . Transthoracic echocardiogram  10/21/2012; 08/21/2014    EF 55-60%. Moderate concentric hypertrophy, grade 1 diastolic dysfunction; b) [id number]: EF 45-50%, Mild concentric LVH, mild AS (p-m gradient 21-16 mmHg),   . Tee without cardioversion N/A 08/22/2014    Procedure: TRANSESOPHAGEAL ECHOCARDIOGRAM (TEE);  Surgeon: Josue Hector, MD;  Location: [facility] ENDOSCOPY;  Service: Cardiovascular;  Laterality: N/A;  . Cardioversion N/A 08/22/2014    Procedure: CARDIOVERSION;  Surgeon: Josue Hector, MD;  Location: [facility] ENDOSCOPY;  Service: Cardiovascular;  Laterality: N/A;   Family History  Problem Relation Age of Onset  . Cancer Mother  ovarin cancer   History  Substance Use Topics  . Smoking status: Former Smoker    Types: Pipe, Landscape architect  . Smokeless tobacco: Never Used  . Alcohol Use: No     Review of Systems Constitutional: Negative for fever, chills and fatigue. + diaphoresis HENT: Negative for congestion, rhinorrhea and sore throat.   Eyes: Negative for visual  disturbance.  Respiratory: + SOB, Negative for cough and wheezing.   Cardiovascular: + for CP.  Gastrointestinal: - nausea, vomiting; denies abdominal pain and diarrhea.  Genitourinary: Negative for flank pain, dysuria, frequency.  Musculoskeletal: Negative for back pain, neck pain and neck stiffness, leg pain/swelling.  Skin: Negative for rash.  Neurological: Negative for dizziness and headaches.  All other systems reviewed and are negative.   Physical Exam  Physical Exam  ED Triage Vitals  Enc Vitals Group     BP 03/13/15 1604 147/80 mmHg     Pulse Rate 03/13/15 1604 65     Resp 03/13/15 1604 18     Temp 03/13/15 1604 97.8 F (36.6 C)     Temp Source 03/13/15 1604 Oral     SpO2 03/13/15 1604 99 %     Weight 03/13/15 1604 192 lb (87.091 kg)     Height 03/13/15 1604 5\' 7"  (1.702 m)     Head Cir --      Peak Flow --      Pain Score --      Pain Loc --      Pain Edu? --      Excl. in [place]? --    Constitutional: Patient is well-appearing elderly Jackson in no acute distress Head: Normocephalic and atraumatic.  Eyes: Extraocular motion intact, no scleral icterus Neck: Supple without meningismus, mass, or overt JVD.  Respiratory: Effort normal and breath sounds normal. No respiratory distress. Chest wall: nonreproducible CP on exam CV: Heart regular rate and rhythm, no obvious murmurs.  Pulses +2 and symmetric. \Euvolemic on exam. Abdomen: Soft, non-tender, non-distended MSK: Extremities are atraumatic without deformity, ROM intact. No calf TTP. Skin: Warm, dry, intact. Neuro: Alert and oriented, no motor deficit noted Psychiatric: Mood and affect are normal.    ED Course  Procedures   Labs Reviewed  BASIC METABOLIC PANEL - Abnormal; Notable for the following:    Chloride 112 (*)    BUN 46 (*)    Creatinine, Ser 2.37 (*)    Calcium 8.5 (*)    GFR calc non Af Amer 24 (*)    GFR calc Af Amer 28 (*)    All other components within normal limits  CBC - Abnormal; Notable  for the following:    RBC 3.74 (*)    Hemoglobin 12.1 (*)    HCT 36.6 (*)    All other components within normal limits  PROTIME-INR - Abnormal; Notable for the following:    Prothrombin Time 25.2 (*)    INR 2.31 (*)    All other components within normal limits  TROPONIN I - Abnormal; Notable for the following:    Troponin I 0.06 (*)    All other components within normal limits  TROPONIN I - Abnormal; Notable for the following:    Troponin I 0.05 (*)    All other components within normal limits  TROPONIN I - Abnormal; Notable for the following:    Troponin I 0.05 (*)    All other components within normal limits  COMPREHENSIVE METABOLIC PANEL - Abnormal; Notable for the following:    Chloride 113 (*)  CO2 21 (*)    BUN 39 (*)    Creatinine, Ser 1.92 (*)    Calcium 8.3 (*)    Total Protein 5.6 (*)    Albumin 3.0 (*)    ALT 13 (*)    GFR calc non Af Amer 31 (*)    GFR calc Af Amer 36 (*)    All other components within normal limits  PROTIME-INR - Abnormal; Notable for the following:    Prothrombin Time 26.5 (*)    INR 2.48 (*)    All other components within normal limits  I-STAT TROPOININ, ED   I personally reviewed and interpreted all labs.  DG Chest 2 View  Final Result     I personally viewed above image(s) which were used in my medical decision making. Formal interpretations by Radiology.   EKG Interpretation  Date/Time:  Wednesday March 13 2015 16:06:00 EDT Ventricular Rate:  70 PR Interval:  208 QRS Duration: 97 QT Interval:  399 QTC Calculation: 430 R Axis:   -14 Text Interpretation:  Sinus rhythm Borderline low voltage, extremity leads No significant change since last tracing Confirmed by Canary Brim  MD, MARTHA [phone]) on 03/13/2015 4:24:03 PM       MDM: [name] is a 81 y.o. Jackson who p/w CP; 20 min, now resolved. ABCs intact. HDS, NAD. Cardiac w/u initiated.  EKG personally review by myself and showed: NSR, NSTWA, unchanged from prior.  HEART  Score high risk: Plan for observation for r/o  Previous w/u:   09/04/14 Myocardial perfusion :  Overall Impression: Low risk stress nuclear study with small, mild, fixed area of inferior diaphragmatic attenuation.  LV Wall Motion: NL LV Function; NL Wall Motion; LVEF 60%  Labs notable for initial Tn 0.01.  Pain does not radiate to back, blood pressure is stable. No mediastinal widening on CXR. Dissection unlikely. Pt's breath sounds are equal bilaterally. No PTX on CXR. PTX unlikely. Pain is not associated with meals. GERD unlikely. Pt has Wells score of 0; PE unlikely. Pain is not positional and EKG is WNL. Pericarditis unlikely. No h/o cocaine use. Pt reports no cough or fever. No infiltrate on CXR. PNA unlikely. No Hx of exertion, trauma. Pain is non-reproducible. Costochondritis unlikely.  -Re-evaluation: remains HDS, NAD -Disposition: admit  Clinical Impression: 1. Chest pain, rule out acute myocardial infarction    Disposition: Admit  Condition: stable  I have discussed the results, Dx and Tx plan with the pt(& family if present). He/she/they expressed understanding and agree(s) with the plan.  Pt seen in conjunction with Dr. Norberto Sorenson, [place] [facility] Resident - PGY-3           Kirstie Peri, MD 03/14/15 [place], MD 03/16/15 [phone]

## 2015-03-13 NOTE — ED Notes (Signed)
PER EMS: pt from work, here for CP. He was having a HA, took Sudafed and about 20 minutes later he developed sharp, central chest pain lasting about 45 minutes. Pain free right now upon arrival. Pt denied SOB, dizziness, diaphoresis; only complaint was the chest pain and he thinks it was a reaction from the Sudafed, but has taken it in the past multiple times. BP-150/60, HR-66, O2-99% RA, CBG-119. A&O4.

## 2015-03-14 ENCOUNTER — Observation Stay (HOSPITAL_COMMUNITY): Payer: Medicare Other

## 2015-03-14 DIAGNOSIS — I350 Nonrheumatic aortic (valve) stenosis: Secondary | ICD-10-CM

## 2015-03-14 DIAGNOSIS — Z8679 Personal history of other diseases of the circulatory system: Secondary | ICD-10-CM

## 2015-03-14 DIAGNOSIS — I200 Unstable angina: Secondary | ICD-10-CM

## 2015-03-14 DIAGNOSIS — R079 Chest pain, unspecified: Secondary | ICD-10-CM

## 2015-03-14 LAB — TROPONIN I
Troponin I: 0.05 ng/mL — ABNORMAL HIGH (ref <0.031)
Troponin I: 0.05 ng/mL — ABNORMAL HIGH (ref <0.031)

## 2015-03-14 LAB — COMPREHENSIVE METABOLIC PANEL
ALT: 13 U/L — ABNORMAL LOW (ref 17–63)
AST: 21 U/L (ref 15–41)
Albumin: 3 g/dL — ABNORMAL LOW (ref 3.5–5.0)
Alkaline Phosphatase: 42 U/L (ref 38–126)
Anion gap: 6 (ref 5–15)
BILIRUBIN TOTAL: 0.7 mg/dL (ref 0.3–1.2)
BUN: 39 mg/dL — AB (ref 6–20)
CO2: 21 mmol/L — AB (ref 22–32)
Calcium: 8.3 mg/dL — ABNORMAL LOW (ref 8.9–10.3)
Chloride: 113 mmol/L — ABNORMAL HIGH (ref 101–111)
Creatinine, Ser: 1.92 mg/dL — ABNORMAL HIGH (ref 0.61–1.24)
GFR calc Af Amer: 36 mL/min — ABNORMAL LOW (ref >60)
GFR calc non Af Amer: 31 mL/min — ABNORMAL LOW (ref >60)
Glucose, Bld: 90 mg/dL (ref 65–99)
Potassium: 4.3 mmol/L (ref 3.5–5.1)
SODIUM: 140 mmol/L (ref 135–145)
Total Protein: 5.6 g/dL — ABNORMAL LOW (ref 6.5–8.1)

## 2015-03-14 LAB — PROTIME-INR
INR: 2.48 — ABNORMAL HIGH (ref 0.00–1.49)
Prothrombin Time: 26.5 seconds — ABNORMAL HIGH (ref 11.6–15.2)

## 2015-03-14 MED ORDER — SODIUM CHLORIDE 0.9 % IV SOLN
INTRAVENOUS | Status: AC
  Administered 2015-03-14 – 2015-03-15 (×3): via INTRAVENOUS

## 2015-03-14 NOTE — Progress Notes (Signed)
  Echocardiogram 2D Echocardiogram has been performed.  Mark Jackson 03/14/2015, 4:28 PM
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
## 2015-03-14 NOTE — Progress Notes (Signed)
[facility] PROGRESS NOTE  Mark Jackson. CW:[id number] DOB: 10/04/1929 DOA: 03/13/2015 PCP: Jenny Reichmann, MD  Assessment/Plan: Principal Problem:   Unstable angina pectoris Active Problems:   Essential hypertension   Edema   Hyperlipidemia   Obesity (BMI 30-39.9)   Paroxysmal atrial fibrillation   CKD (chronic kidney disease) stage 3, GFR 30-59 ml/min   Chronic combined systolic and diastolic heart failure   Chest pain, rule out acute myocardial infarction   Chest pain   Aortic stenosis   Chest pain, probable unstable angina  as per cardiology,  Patient with heart score of 5, moderate story and history of atherosclerotic disease. Cardiac enzymes have remained unchanged 3,  in the setting of multiple risk factors including carotid artery disease and high risk coronary artery disease, patient will have a 2-D echo today, cardiology to decide if coronary angiography is  justified in the setting of his chronic kidney disease. He has been initiated on IV fluids until further plan of action is made. Hold anticoagulation.   . Paroxysmal atrial fibrillation on chronic anticoagulation: INR therapeutic, hold anticoagulation for now in anticipation of impending cardiac cath.. Currently in normal sinus rhythm.  . Obesity (BMI 30-39.9): Patient meets criteria with BMI greater than 30   . Edema: Chronic.   . CKD (chronic kidney disease) stage 3, GFR 30-59 ml/min: Some mild drop in GFR, minimal dehydration. We'll hold HCTZ  . Essential hypertension: Stable. Hold HCTZ  . Chronic combined systolic and diastolic heart failure: Looks to be euvolemic. Echocardiogram done in January of this year noted mild systolic dysfunction and unable to evaluate diastolic function  . Hyperlipidemia: Continue statin.  Code Status:      Code Status Orders        Start     Ordered   03/13/15 2120  Full code   Continuous     03/13/15 2120    Advance Directive Documentation        Most  Recent Value   Type of Advance Directive  Healthcare Power of Attorney, Living will   Pre-existing out of facility DNR order (yellow form or pink MOST form)     "MOST" Form in Place?       Family Communication: family updated about patient's clinical progress Disposition Plan:  As above    Brief narrative: 81 y.o. male with a past medical history significant for mild aortic stenosis, moderate to severe right carotid stenosis, paroxysmal atrial fibrillation, hypertension, hyperlipidemia and diastolic dysfunction as well as chronic kidney disease stage III-IV (today creatinine 2.37, GFR 24; baseline creatinine 1.9, GFR 30) .  He presents after an episode of severe chest pain associated with minimal physical activity (walking 50-60 feet in his office), lasting several minutes and described as an "anvil standing on my chest". He has never experienced such symptoms before. He took aspirin but felt extremely weak and required help to simply remove this from his pouch. The symptoms resolved by the time EMS arrived. He had taken a couple of Sudafed not long before the symptoms, but he does this on a fairly regular basis to preempt migraine headaches (at least 2-3 times a month) and he has not experienced the current symptoms before.  The ECG does not show repolarization abnormalities. Cardiac troponin I is borderline elevated at 0.06 in a "plateau" pattern. He is currently asymptomatic  He had a normal nuclear stress test (mild inferior wall attenuation artifact) in January 2016. His echocardiogram at that  time did show a mild reduction in ejection fraction at 45-50 percent with diffuse hypokinesis as well as mild aortic valve stenosis. Of note, calculated EF by nuclear scintigraphy was normal at 60% and there was normal wall motion. He is anticoagulated and today the INR is 2.3. He has not had detected atrial fibrillation since hospitalization in January. Does not have a history of stroke/TIA or other  embolic events and has not had bleeding complications.  Consultants:  Cardiology  Procedures:  None  Antibiotics: Anti-infectives    None         HPI/Subjective: Patient's chest pain is improving, denies any ongoing chest pain at this time, sitting comfortably in bed  Objective: Filed Vitals:   03/13/15 2026 03/14/15 0030 03/14/15 0425 03/14/15 0734  BP: 167/86 144/70 130/72 140/69  Pulse: 62 72 62 59  Temp: 97.6 F (36.4 C) 97.7 F (36.5 C) 97.6 F (36.4 C) 97.7 F (36.5 C)  TempSrc: Oral Oral Oral Oral  Resp: 18 18 18    Height: 5\' 8"  (1.727 m)     Weight: 90.493 kg (199 lb 8 oz)  90.629 kg (199 lb 12.8 oz)   SpO2: 100% 99% 98% 98%    Intake/Output Summary (Last 24 hours) at 03/14/15 1102 Last data filed at 03/14/15 1027  Gross per 24 hour  Intake    280 ml  Output   1000 ml  Net   -720 ml    Exam:  General: No acute respiratory distress Lungs: Clear to auscultation bilaterally without wheezes or crackles Cardiovascular: Regular rate and rhythm without murmur gallop or rub normal S1 and S2 Abdomen: Nontender, nondistended, soft, bowel sounds positive, no rebound, no ascites, no appreciable mass Extremities: No significant cyanosis, clubbing, or edema bilateral lower extremities     Data Review   Micro Results No results found for this or any previous visit (from the past 240 hour(s)).  Radiology Reports Dg Chest 2 View  03/13/2015   CLINICAL DATA:  Acute onset chest pain earlier today. Atrial fibrillation.  EXAM: CHEST  2 VIEW  COMPARISON:  08/20/2014  FINDINGS: The heart size and mediastinal contours are within normal limits. Both lungs are clear. No evidence of pleural effusion. Mild thoracic spine degenerative disc disease noted.  IMPRESSION: No active cardiopulmonary disease.   Electronically Signed   By: Earle Gell M.D.   On: 03/13/2015 16:31     CBC  Recent Labs Lab 03/13/15 1607  WBC 10.1  HGB 12.1*  HCT 36.6*  PLT 193  MCV 97.9   MCH 32.4  MCHC 33.1  RDW 13.0    Chemistries   Recent Labs Lab 03/13/15 1607  NA 142  K 4.4  CL 112*  CO2 24  GLUCOSE 89  BUN 46*  CREATININE 2.37*  CALCIUM 8.5*   ------------------------------------------------------------------------------------------------------------------ estimated creatinine clearance is 26.3 mL/min (by C-G formula based on Cr of 2.37). ------------------------------------------------------------------------------------------------------------------ No results for input(s): HGBA1C in the last 72 hours. ------------------------------------------------------------------------------------------------------------------ No results for input(s): CHOL, HDL, LDLCALC, TRIG, CHOLHDL, LDLDIRECT in the last 72 hours. ------------------------------------------------------------------------------------------------------------------ No results for input(s): TSH, T4TOTAL, T3FREE, THYROIDAB in the last 72 hours.  Invalid input(s): FREET3 ------------------------------------------------------------------------------------------------------------------ No results for input(s): VITAMINB12, FOLATE, FERRITIN, TIBC, IRON, RETICCTPCT in the last 72 hours.  Coagulation profile  Recent Labs Lab 03/13/15 1607  INR 2.31*    No results for input(s): DDIMER in the last 72 hours.  Cardiac Enzymes  Recent Labs Lab 03/13/15 2233 03/14/15 03/14/15 0331  TROPONINI 0.06* 0.05* 0.05*   ------------------------------------------------------------------------------------------------------------------  Invalid input(s): POCBNP   CBG: No results for input(s): GLUCAP in the last 168 hours.     Studies: Dg Chest 2 View  03/13/2015   CLINICAL DATA:  Acute onset chest pain earlier today. Atrial fibrillation.  EXAM: CHEST  2 VIEW  COMPARISON:  08/20/2014  FINDINGS: The heart size and mediastinal contours are within normal limits. Both lungs are clear. No evidence of pleural  effusion. Mild thoracic spine degenerative disc disease noted.  IMPRESSION: No active cardiopulmonary disease.   Electronically Signed   By: Earle Gell M.D.   On: 03/13/2015 16:31      Lab Results  Component Value Date   HGBA1C 6.0* 01/12/2014   Lab Results  Component Value Date   LDLCALC 57 08/21/2014   CREATININE 2.37* 03/13/2015       Scheduled Meds: . aspirin  81 mg Oral QHS  . diltiazem  120 mg Oral Daily  . ferrous sulfate  325 mg Oral Q breakfast  . metoprolol succinate  25 mg Oral QHS  . rosuvastatin  5 mg Oral Daily  . topiramate  150 mg Oral QHS  . [START ON 03/15/2015] warfarin  2.5 mg Oral Q Fri-1800   And  . warfarin  5 mg Oral Once per day on Sun Mon Tue Wed Thu Sat  . Warfarin - Pharmacist Dosing Inpatient   Does not apply q1800   Continuous Infusions: . sodium chloride 100 mL/hr at 03/14/15 1027    Principal Problem:   Unstable angina pectoris Active Problems:   Essential hypertension   Edema   Hyperlipidemia   Obesity (BMI 30-39.9)   Paroxysmal atrial fibrillation   CKD (chronic kidney disease) stage 3, GFR 30-59 ml/min   Chronic combined systolic and diastolic heart failure   Chest pain, rule out acute myocardial infarction   Chest pain   Aortic stenosis    Time spent: 45 minutes   [facility] Pager [phone]. If 7PM-7AM, please contact night-coverage at [email or web address], password [email or web address] 03/14/2015, 11:02 AM

## 2015-03-15 DIAGNOSIS — R072 Precordial pain: Secondary | ICD-10-CM

## 2015-03-15 DIAGNOSIS — R079 Chest pain, unspecified: Secondary | ICD-10-CM | POA: Diagnosis not present

## 2015-03-15 DIAGNOSIS — I350 Nonrheumatic aortic (valve) stenosis: Secondary | ICD-10-CM | POA: Diagnosis not present

## 2015-03-15 DIAGNOSIS — I200 Unstable angina: Secondary | ICD-10-CM | POA: Diagnosis not present

## 2015-03-15 LAB — PROTIME-INR
INR: 2.62 — AB (ref 0.00–1.49)
Prothrombin Time: 27.7 seconds — ABNORMAL HIGH (ref 11.6–15.2)

## 2015-03-15 LAB — CBC
HCT: 33.3 % — ABNORMAL LOW (ref 39.0–52.0)
Hemoglobin: 10.8 g/dL — ABNORMAL LOW (ref 13.0–17.0)
MCH: 31.5 pg (ref 26.0–34.0)
MCHC: 32.4 g/dL (ref 30.0–36.0)
MCV: 97.1 fL (ref 78.0–100.0)
Platelets: 182 10*3/uL (ref 150–400)
RBC: 3.43 MIL/uL — ABNORMAL LOW (ref 4.22–5.81)
RDW: 13 % (ref 11.5–15.5)
WBC: 7.8 10*3/uL (ref 4.0–10.5)

## 2015-03-15 NOTE — Progress Notes (Signed)
     SUBJECTIVE: No chest pain or SOB.   BP 126/61 mmHg  Pulse 51  Temp(Src) 97.7 F (36.5 C) (Oral)  Resp 18  Ht 5\' 8"  (1.727 m)  Wt 203 lb 9.6 oz (92.352 kg)  BMI 30.96 kg/m2  SpO2 100%  Intake/Output Summary (Last 24 hours) at 03/15/15 1107 Last data filed at 03/15/15 0500  Gross per 24 hour  Intake    240 ml  Output   2200 ml  Net  -1960 ml    PHYSICAL EXAM General: Well developed, well nourished, in no acute distress. Alert and oriented x 3.  Psych:  Good affect, responds appropriately Neck: No JVD. No masses noted.  Lungs: Clear bilaterally with no wheezes or rhonci noted.  Heart: RRR with mild systolic murmur.  Abdomen: Bowel sounds are present. Soft, non-tender.  Extremities: No lower extremity edema.   LABS: Basic Metabolic Panel:  Recent Labs  03/13/15 1607 03/14/15 1200  NA 142 140  K 4.4 4.3  CL 112* 113*  CO2 24 21*  GLUCOSE 89 90  BUN 46* 39*  CREATININE 2.37* 1.92*  CALCIUM 8.5* 8.3*   CBC:  Recent Labs  03/13/15 1607 03/15/15 0507  WBC 10.1 7.8  HGB 12.1* 10.8*  HCT 36.6* 33.3*  MCV 97.9 97.1  PLT 193 182   Cardiac Enzymes:  Recent Labs  03/13/15 2233 03/14/15 03/14/15 0331  TROPONINI 0.06* 0.05* 0.05*   Current Meds: . aspirin  81 mg Oral QHS  . diltiazem  120 mg Oral Daily  . ferrous sulfate  325 mg Oral Q breakfast  . metoprolol succinate  25 mg Oral QHS  . rosuvastatin  5 mg Oral Daily  . topiramate  150 mg Oral QHS   Echo 03/14/15: Left ventricle: The cavity size was normal. Wall thickness was increased in a pattern of mild LVH. Systolic function was normal. The estimated ejection fraction was in the range of 50% to 55%. Although no diagnostic regional wall motion abnormality was identified, this possibility cannot be completely excluded on the basis of this study. Left ventricular diastolic function parameters were normal. - Aortic valve: There was mild stenosis. There was trivial regurgitation.  Valve area (VTI): 1.74 cm^2. Valve area (Vmax): 1.6 cm^2. Valve area (Vmean): 1.46 cm^2. - Left atrium: The atrium was mildly dilated.  ASSESSMENT AND PLAN:  1. Chest pain: No recurrence. Minimal troponin elevation, 0.05.  Echo with overall normal LV systolic function. Given his renal insufficiency, I would not pursue cardiac cath at this time. OK to discharge home today and follow up with Dr. Ellyn Hack. Continue ASA, beta blocker, metoprolol, statin.   2. Aortic stenosis: Mild, should not be related to current presentation  MCALHANY,CHRISTOPHER  8/5/201611:07 AM

## 2015-03-15 NOTE — Progress Notes (Addendum)
[facility] PROGRESS NOTE  Mark Jackson. CW:[id number] DOB: [birth year] DOA: 03/13/2015 PCP: Jenny Reichmann, MD  Assessment/Plan: Principal Problem:   Unstable angina pectoris Active Problems:   Essential hypertension   Edema   Hyperlipidemia   Obesity (BMI 30-39.9)   Paroxysmal atrial fibrillation   CKD (chronic kidney disease) stage 3, GFR 30-59 ml/min   Chronic combined systolic and diastolic heart failure   Chest pain, rule out acute myocardial infarction   Chest pain   Aortic stenosis   Chest pain, probable unstable angina  as per cardiology,  Patient with heart score of 5, moderate story and history of atherosclerotic disease. Cardiac enzymes have remained unchanged 3,  in the setting of multiple risk factors including carotid artery disease and high risk coronary artery disease,    2-D echo Systolic function was normal. The estimated ejection fraction was in the range of 50% to 55%. Although no diagnostic regional wall motion abnormality was identified, , cardiology to decide if coronary angiography is  justified in the setting of his chronic kidney disease. He has been initiated on IV fluids until further plan of action is made. Hold anticoagulation. INR therapeutic   . Paroxysmal atrial fibrillation on chronic anticoagulation: INR therapeutic, hold anticoagulation for now in anticipation of impending cardiac cath.. Currently in normal sinus rhythm.  . Obesity (BMI 30-39.9): Patient meets criteria with BMI greater than 30   . Edema: Chronic.   . CKD (chronic kidney disease) stage 3, GFR 30-59 ml/min: Some mild drop in GFR, minimal dehydration. We'll hold HCTZ  . Essential hypertension: Stable. Hold HCTZ  . Chronic combined systolic and diastolic heart failure: Looks to be euvolemic. Echocardiogram done in January of this year noted mild systolic dysfunction and unable to evaluate diastolic function  . Hyperlipidemia: Continue statin.   Code Status:       Code Status Orders        Start     Ordered   03/13/15 2120  Full code   Continuous     03/13/15 2120    Advance Directive Documentation        Most Recent Value   Type of Advance Directive  Healthcare Power of Attorney, Living will   Pre-existing out of facility DNR order (yellow form or pink MOST form)     "MOST" Form in Place?       Family Communication: family updated about patient's clinical progress Disposition Plan:  Per cardiology    Brief narrative: 81 y.o. male with a past medical history significant for mild aortic stenosis, moderate to severe right carotid stenosis, paroxysmal atrial fibrillation, hypertension, hyperlipidemia and diastolic dysfunction as well as chronic kidney disease stage III-IV (today creatinine 2.37, GFR 24; baseline creatinine 1.9, GFR 30) .  He presents after an episode of severe chest pain associated with minimal physical activity (walking 50-60 feet in his office), lasting several minutes and described as an "anvil standing on my chest". He has never experienced such symptoms before. He took aspirin but felt extremely weak and required help to simply remove this from his pouch. The symptoms resolved by the time EMS arrived. He had taken a couple of Sudafed not long before the symptoms, but he does this on a fairly regular basis to preempt migraine headaches (at least 2-3 times a month) and he has not experienced the current symptoms before.  The ECG does not show repolarization abnormalities. Cardiac troponin I is borderline elevated at 0.06 in  a "plateau" pattern. He is currently asymptomatic  He had a normal nuclear stress test (mild inferior wall attenuation artifact) in January 2016. His echocardiogram at that time did show a mild reduction in ejection fraction at 45-50 percent with diffuse hypokinesis as well as mild aortic valve stenosis. Of note, calculated EF by nuclear scintigraphy was normal at 60% and there was normal wall motion. He is  anticoagulated and today the INR is 2.3. He has not had detected atrial fibrillation since hospitalization in January. Does not have a history of stroke/TIA or other embolic events and has not had bleeding complications.  Consultants:  Cardiology  Procedures:  None  Antibiotics: Anti-infectives    None         HPI/Subjective: Patient nothing by mouth, ambulating the halls, waiting to see a cardiologist, chest pain-free  Objective: Filed Vitals:   03/14/15 1226 03/14/15 1712 03/14/15 1930 03/15/15 0500  BP: 154/63 156/69 156/66 126/61  Pulse: 57 66 58 51  Temp: 98.1 F (36.7 C) 97.7 F (36.5 C) 97.9 F (36.6 C) 97.7 F (36.5 C)  TempSrc: Oral Oral Oral Oral  Resp:   16 18  Height:      Weight:    92.352 kg (203 lb 9.6 oz)  SpO2: 100% 100% 100% 100%    Intake/Output Summary (Last 24 hours) at 03/15/15 1018 Last data filed at 03/15/15 0500  Gross per 24 hour  Intake    240 ml  Output   2350 ml  Net  -2110 ml    Exam:  General: No acute respiratory distress Lungs: Clear to auscultation bilaterally without wheezes or crackles Cardiovascular: Regular rate and rhythm without murmur gallop or rub normal S1 and S2 Abdomen: Nontender, nondistended, soft, bowel sounds positive, no rebound, no ascites, no appreciable mass Extremities: No significant cyanosis, clubbing, or edema bilateral lower extremities     Data Review   Micro Results No results found for this or any previous visit (from the past 240 hour(s)).  Radiology Reports Dg Chest 2 View  03/13/2015   CLINICAL DATA:  Acute onset chest pain earlier today. Atrial fibrillation.  EXAM: CHEST  2 VIEW  COMPARISON:  08/20/2014  FINDINGS: The heart size and mediastinal contours are within normal limits. Both lungs are clear. No evidence of pleural effusion. Mild thoracic spine degenerative disc disease noted.  IMPRESSION: No active cardiopulmonary disease.   Electronically Signed   By: Earle Gell M.D.   On:  03/13/2015 16:31     CBC  Recent Labs Lab 03/13/15 1607 03/15/15 0507  WBC 10.1 7.8  HGB 12.1* 10.8*  HCT 36.6* 33.3*  PLT 193 182  MCV 97.9 97.1  MCH 32.4 31.5  MCHC 33.1 32.4  RDW 13.0 13.0    Chemistries   Recent Labs Lab 03/13/15 1607 03/14/15 1200  NA 142 140  K 4.4 4.3  CL 112* 113*  CO2 24 21*  GLUCOSE 89 90  BUN 46* 39*  CREATININE 2.37* 1.92*  CALCIUM 8.5* 8.3*  AST  --  21  ALT  --  13*  ALKPHOS  --  42  BILITOT  --  0.7   ------------------------------------------------------------------------------------------------------------------ estimated creatinine clearance is 32.7 mL/min (by C-G formula based on Cr of 1.92). ------------------------------------------------------------------------------------------------------------------ No results for input(s): HGBA1C in the last 72 hours. ------------------------------------------------------------------------------------------------------------------ No results for input(s): CHOL, HDL, LDLCALC, TRIG, CHOLHDL, LDLDIRECT in the last 72 hours. ------------------------------------------------------------------------------------------------------------------ No results for input(s): TSH, T4TOTAL, T3FREE, THYROIDAB in the last 72 hours.  Invalid input(s):  FREET3 ------------------------------------------------------------------------------------------------------------------ No results for input(s): VITAMINB12, FOLATE, FERRITIN, TIBC, IRON, RETICCTPCT in the last 72 hours.  Coagulation profile  Recent Labs Lab 03/13/15 1607 03/14/15 1300 03/15/15 0507  INR 2.31* 2.48* 2.62*    No results for input(s): DDIMER in the last 72 hours.  Cardiac Enzymes  Recent Labs Lab 03/13/15 2233 03/14/15 03/14/15 0331  TROPONINI 0.06* 0.05* 0.05*   ------------------------------------------------------------------------------------------------------------------ Invalid input(s): POCBNP   CBG: No results for  input(s): GLUCAP in the last 168 hours.     Studies: Dg Chest 2 View  03/13/2015   CLINICAL DATA:  Acute onset chest pain earlier today. Atrial fibrillation.  EXAM: CHEST  2 VIEW  COMPARISON:  08/20/2014  FINDINGS: The heart size and mediastinal contours are within normal limits. Both lungs are clear. No evidence of pleural effusion. Mild thoracic spine degenerative disc disease noted.  IMPRESSION: No active cardiopulmonary disease.   Electronically Signed   By: Earle Gell M.D.   On: 03/13/2015 16:31      Lab Results  Component Value Date   HGBA1C 6.0* 01/12/2014   Lab Results  Component Value Date   LDLCALC 57 08/21/2014   CREATININE 1.92* 03/14/2015       Scheduled Meds: . aspirin  81 mg Oral QHS  . diltiazem  120 mg Oral Daily  . ferrous sulfate  325 mg Oral Q breakfast  . metoprolol succinate  25 mg Oral QHS  . rosuvastatin  5 mg Oral Daily  . topiramate  150 mg Oral QHS   Continuous Infusions: . sodium chloride 100 mL/hr at 03/15/15 [id number]    Principal Problem:   Unstable angina pectoris Active Problems:   Essential hypertension   Edema   Hyperlipidemia   Obesity (BMI 30-39.9)   Paroxysmal atrial fibrillation   CKD (chronic kidney disease) stage 3, GFR 30-59 ml/min   Chronic combined systolic and diastolic heart failure   Chest pain, rule out acute myocardial infarction   Chest pain   Aortic stenosis    Time spent: 45 minutes   [facility] Pager [phone]. If 7PM-7AM, please contact night-coverage at [email or web address], password [facility] 03/15/2015, 10:18 AM

## 2015-03-15 NOTE — Discharge Summary (Signed)
Physician Discharge Summary  Mark Jackson. MRN: [id number] DOB/AGE: 12/27/1929 81 y.o.  PCP: Jenny Reichmann, MD   Admit date: 03/13/2015 Discharge date: 03/15/2015  Discharge Diagnoses:     Principal Problem:   Unstable angina pectoris Active Problems:   Essential hypertension   Edema   Hyperlipidemia   Obesity (BMI 30-39.9)   Paroxysmal atrial fibrillation   CKD (chronic kidney disease) stage 3, GFR 30-59 ml/min   Chronic combined systolic and diastolic heart failure   Chest pain, rule out acute myocardial infarction   Chest pain   Aortic stenosis    Follow-up recommendations Follow-up with PCP in 3-5 days , including although additional recommended appointments as below Follow-up CBC, CMP in 3-5 days follow up with Dr. Ellyn Hack    Medication List    STOP taking these medications        LOPERAMIDE HCL PO     pseudoephedrine 30 MG tablet  Commonly known as:  SUDAFED      TAKE these medications        acetaminophen 325 MG tablet  Commonly known as:  TYLENOL  Take 325-650 mg by mouth every 6 (six) hours as needed for headache.     aspirin 81 MG tablet  Take 81 mg by mouth at bedtime.     baclofen 10 MG tablet  Commonly known as:  LIORESAL  Take 10 mg by mouth daily as needed (for headache).     CRESTOR 5 MG tablet  Generic drug:  rosuvastatin  TAKE 1 TABLET ONCE DAILY.     diltiazem 120 MG 24 hr capsule  Commonly known as:  CARDIZEM CD  Take 1 capsule (120 mg total) by mouth daily.     ferrous sulfate 325 (65 FE) MG tablet  Take 325 mg by mouth daily with breakfast.     hydrochlorothiazide 12.5 MG capsule  Commonly known as:  MICROZIDE  Take 1 capsule (12.5 mg total) by mouth daily.     metoprolol succinate 50 MG 24 hr tablet  Commonly known as:  TOPROL-XL  Take 1 tablet (50 mg total) by mouth at bedtime. Take with or immediately following a meal.     multivitamin with minerals tablet  Take 1 tablet by mouth daily.     topiramate 100 MG  tablet  Commonly known as:  TOPAMAX  Take 150 mg by mouth at bedtime.     VISINE OP  Apply 1 drop to eye 2 (two) times daily as needed (burning of eyes).     warfarin 5 MG tablet  Commonly known as:  COUMADIN  TAKE 1 TABLET ONCE DAILY AT 6PM OR AS DIRECTED.         Discharge Condition: *Stable    Disposition: 01-Home or Self Care   Consults:  Cardiology     Significant Diagnostic Studies:  Dg Chest 2 View  03/13/2015   CLINICAL DATA:  Acute onset chest pain earlier today. Atrial fibrillation.  EXAM: CHEST  2 VIEW  COMPARISON:  08/20/2014  FINDINGS: The heart size and mediastinal contours are within normal limits. Both lungs are clear. No evidence of pleural effusion. Mild thoracic spine degenerative disc disease noted.  IMPRESSION: No active cardiopulmonary disease.   Electronically Signed   By: Earle Gell M.D.   On: 03/13/2015 16:31      Filed Weights   03/13/15 2026 03/14/15 0425 03/15/15 0500  Weight: 90.493 kg (199 lb 8 oz) 90.629 kg (199 lb 12.8 oz) 92.352 kg (203 lb  9.6 oz)     Microbiology: No results found for this or any previous visit (from the past 240 hour(s)).     Blood Culture    Component Value Date/Time   SDES BLOOD LEFT HAND 08/20/2014 2210   SPECREQUEST BOTTLES DRAWN AEROBIC AND ANAEROBIC 10CC 08/20/2014 2210   CULT  08/20/2014 2210    NO GROWTH 5 DAYS Performed at [facility] 08/27/2014 FINAL 08/20/2014 2210      Labs: Results for orders placed or performed during the hospital encounter of 03/13/15 (from the past 48 hour(s))  Basic metabolic panel     Status: Abnormal   Collection Time: 03/13/15  4:07 PM  Result Value Ref Range   Sodium 142 135 - 145 mmol/L   Potassium 4.4 3.5 - 5.1 mmol/L   Chloride 112 (H) 101 - 111 mmol/L   CO2 24 22 - 32 mmol/L   Glucose, Bld 89 65 - 99 mg/dL   BUN 46 (H) 6 - 20 mg/dL   Creatinine, Ser 2.37 (H) 0.61 - 1.24 mg/dL   Calcium 8.5 (L) 8.9 - 10.3 mg/dL   GFR calc non Af  Amer 24 (L) >60 mL/min   GFR calc Af Amer 28 (L) >60 mL/min    Comment: (NOTE) The eGFR has been calculated using the CKD EPI equation. This calculation has not been validated in all clinical situations. eGFR's persistently <60 mL/min signify possible Chronic Kidney Disease.    Anion gap 6 5 - 15  CBC     Status: Abnormal   Collection Time: 03/13/15  4:07 PM  Result Value Ref Range   WBC 10.1 4.0 - 10.5 K/uL   RBC 3.74 (L) 4.22 - 5.81 MIL/uL   Hemoglobin 12.1 (L) 13.0 - 17.0 g/dL   HCT 36.6 (L) 39.0 - 52.0 %   MCV 97.9 78.0 - 100.0 fL   MCH 32.4 26.0 - 34.0 pg   MCHC 33.1 30.0 - 36.0 g/dL   RDW 13.0 11.5 - 15.5 %   Platelets 193 150 - 400 K/uL  Protime-INR - (order if Patient is taking Coumadin / Warfarin)     Status: Abnormal   Collection Time: 03/13/15  4:07 PM  Result Value Ref Range   Prothrombin Time 25.2 (H) 11.6 - 15.2 seconds   INR 2.31 (H) 0.00 - 1.49  I-stat troponin, ED     Status: None   Collection Time: 03/13/15  4:27 PM  Result Value Ref Range   Troponin i, poc 0.01 0.00 - 0.08 ng/mL   Comment 3            Comment: Due to the release kinetics of cTnI, a negative result within the first hours of the onset of symptoms does not rule out myocardial infarction with certainty. If myocardial infarction is still suspected, repeat the test at appropriate intervals.   Troponin I-serum (0, 3, 6 hours)     Status: Abnormal   Collection Time: 03/13/15 10:33 PM  Result Value Ref Range   Troponin I 0.06 (H) <0.031 ng/mL    Comment:        PERSISTENTLY INCREASED TROPONIN VALUES IN THE RANGE OF 0.04-0.49 ng/mL CAN BE SEEN IN:       -UNSTABLE ANGINA       -CONGESTIVE HEART FAILURE       -MYOCARDITIS       -CHEST TRAUMA       -ARRYHTHMIAS       -LATE PRESENTING MYOCARDIAL INFARCTION       -  COPD   CLINICAL FOLLOW-UP RECOMMENDED.   Troponin I-serum (0, 3, 6 hours)     Status: Abnormal   Collection Time: 03/14/15 12:00 AM  Result Value Ref Range   Troponin I 0.05 (H)  <0.031 ng/mL    Comment:        PERSISTENTLY INCREASED TROPONIN VALUES IN THE RANGE OF 0.04-0.49 ng/mL CAN BE SEEN IN:       -UNSTABLE ANGINA       -CONGESTIVE HEART FAILURE       -MYOCARDITIS       -CHEST TRAUMA       -ARRYHTHMIAS       -LATE PRESENTING MYOCARDIAL INFARCTION       -COPD   CLINICAL FOLLOW-UP RECOMMENDED.   Troponin I-serum (0, 3, 6 hours)     Status: Abnormal   Collection Time: 03/14/15  3:31 AM  Result Value Ref Range   Troponin I 0.05 (H) <0.031 ng/mL    Comment:        PERSISTENTLY INCREASED TROPONIN VALUES IN THE RANGE OF 0.04-0.49 ng/mL CAN BE SEEN IN:       -UNSTABLE ANGINA       -CONGESTIVE HEART FAILURE       -MYOCARDITIS       -CHEST TRAUMA       -ARRYHTHMIAS       -LATE PRESENTING MYOCARDIAL INFARCTION       -COPD   CLINICAL FOLLOW-UP RECOMMENDED.   Comprehensive metabolic panel     Status: Abnormal   Collection Time: 03/14/15 12:00 PM  Result Value Ref Range   Sodium 140 135 - 145 mmol/L   Potassium 4.3 3.5 - 5.1 mmol/L   Chloride 113 (H) 101 - 111 mmol/L   CO2 21 (L) 22 - 32 mmol/L   Glucose, Bld 90 65 - 99 mg/dL   BUN 39 (H) 6 - 20 mg/dL   Creatinine, Ser 1.92 (H) 0.61 - 1.24 mg/dL   Calcium 8.3 (L) 8.9 - 10.3 mg/dL   Total Protein 5.6 (L) 6.5 - 8.1 g/dL   Albumin 3.0 (L) 3.5 - 5.0 g/dL   AST 21 15 - 41 U/L   ALT 13 (L) 17 - 63 U/L   Alkaline Phosphatase 42 38 - 126 U/L   Total Bilirubin 0.7 0.3 - 1.2 mg/dL   GFR calc non Af Amer 31 (L) >60 mL/min   GFR calc Af Amer 36 (L) >60 mL/min    Comment: (NOTE) The eGFR has been calculated using the CKD EPI equation. This calculation has not been validated in all clinical situations. eGFR's persistently <60 mL/min signify possible Chronic Kidney Disease.    Anion gap 6 5 - 15  Protime-INR     Status: Abnormal   Collection Time: 03/14/15  1:00 PM  Result Value Ref Range   Prothrombin Time 26.5 (H) 11.6 - 15.2 seconds   INR 2.48 (H) 0.00 - 1.49  CBC     Status: Abnormal   Collection  Time: 03/15/15  5:07 AM  Result Value Ref Range   WBC 7.8 4.0 - 10.5 K/uL   RBC 3.43 (L) 4.22 - 5.81 MIL/uL   Hemoglobin 10.8 (L) 13.0 - 17.0 g/dL   HCT 33.3 (L) 39.0 - 52.0 %   MCV 97.1 78.0 - 100.0 fL   MCH 31.5 26.0 - 34.0 pg   MCHC 32.4 30.0 - 36.0 g/dL   RDW 13.0 11.5 - 15.5 %   Platelets 182 150 - 400 K/uL  Protime-INR  Status: Abnormal   Collection Time: 03/15/15  5:07 AM  Result Value Ref Range   Prothrombin Time 27.7 (H) 11.6 - 15.2 seconds   INR 2.62 (H) 0.00 - 1.49     Lipid Panel     Component Value Date/Time   CHOL 102 08/21/2014 0830   TRIG 65 08/21/2014 0830   HDL 32* 08/21/2014 0830   CHOLHDL 3.2 08/21/2014 0830   VLDL 13 08/21/2014 0830   LDLCALC 57 08/21/2014 0830     Lab Results  Component Value Date   HGBA1C 6.0* 01/12/2014     Lab Results  Component Value Date   LDLCALC 57 08/21/2014   CREATININE 1.92* 03/14/2015   Echo 03/14/15: Left ventricle: The cavity size was normal. Wall thickness was increased in a pattern of mild LVH. Systolic function was normal. The estimated ejection fraction was in the range of 50% to 55%. Although no diagnostic regional wall motion abnormality was identified, this possibility cannot be completely excluded on the basis of this study. Left ventricular diastolic function parameters were normal. - Aortic valve: There was mild stenosis. There was trivial regurgitation. Valve area (VTI): 1.74 cm^2. Valve area (Vmax): 1.6 cm^2. Valve area (Vmean): 1.46 cm^2. - Left atrium: The atrium was mildly dilated.  HPI :* 82 y.o. male with a past medical history significant for mild aortic stenosis, moderate to severe right carotid stenosis, paroxysmal atrial fibrillation, hypertension, hyperlipidemia and diastolic dysfunction as well as chronic kidney disease stage III-IV (today creatinine 2.37, GFR 24; baseline creatinine 1.9, GFR 30) .  He presents after an episode of severe chest pain associated with  minimal physical activity (walking 50-60 feet in his office), lasting several minutes and described as an "anvil standing on my chest". He has never experienced such symptoms before. He took aspirin but felt extremely weak and required help to simply remove this from his pouch. The symptoms resolved by the time EMS arrived. He had taken a couple of Sudafed not long before the symptoms, but he does this on a fairly regular basis to preempt migraine headaches (at least 2-3 times a month) and he has not experienced the current symptoms before.  The ECG does not show repolarization abnormalities. Cardiac troponin I is borderline elevated at 0.06 in a "plateau" pattern. He is currently asymptomatic  He had a normal nuclear stress test (mild inferior wall attenuation artifact) in January 2016. His echocardiogram at that time did show a mild reduction in ejection fraction at 45-50 percent with diffuse hypokinesis as well as mild aortic valve stenosis. Of note, calculated EF by nuclear scintigraphy was normal at 60% and there was normal wall motion. He is anticoagulated and today the INR is 2.3. He has not had detected atrial fibrillation since hospitalization in January. Does not have a history of stroke/TIA or other embolic events and has not had bleeding complications.    HOSPITAL COURSE:  Chest pain, probable unstable angina as per cardiology, Patient with heart score of 5, moderate story and history of atherosclerotic disease. Cardiac enzymes have remained unchanged 3, in the setting of multiple risk factors including carotid artery disease and high risk coronary artery disease, 2-D echo Systolic function was normal. The estimated ejection fraction was in the range of 50% to 55%. Although no diagnostic regional wall motion abnormality was identified, , cardiology recommended no coronary angiography  in the setting of his chronic kidney disease. Cardiology  Recommends discharge with outpatient  follow-up with Dr. Ellyn Hack     . Paroxysmal  atrial fibrillation on chronic anticoagulation: INR therapeutic, continue anticoagulation   . Obesity (BMI 30-39.9): Patient meets criteria with BMI greater than 30   . Edema: Chronic.   . CKD (chronic kidney disease) stage 3, GFR 30-59 ml/min: Some mild drop in GFR, minimal dehydration. We'll hold HCTZ  . Essential hypertension: Stable resume outpatient regimen  . Chronic combined systolic and diastolic heart failure: Looks to be euvolemic. Echocardiogram done in January of this year noted mild systolic dysfunction and unable to evaluate diastolic function,  . Hyperlipidemia: Continue statin.     Discharge Exam:    Blood pressure 126/61, pulse 51, temperature 97.7 F (36.5 C), temperature source Oral, resp. rate 18, height $RemoveBe'5\' 8"'myIYRMumm$  (1.727 m), weight 92.352 kg (203 lb 9.6 oz), SpO2 100 %.  General: Well developed, well nourished, in no acute distress. Alert and oriented x 3.  Psych: Good affect, responds appropriately Neck: No JVD. No masses noted.  Lungs: Clear bilaterally with no wheezes or rhonci noted.  Heart: RRR with mild systolic murmur.  Abdomen: Bowel sounds are present. Soft, non-tender.  Extremities: No lower extremity edema.         Discharge Instructions    Diet - low sodium heart healthy    Complete by:  As directed      Increase activity slowly    Complete by:  As directed            Follow-up Information    Follow up with [name], STEVE A, MD. Schedule an appointment as soon as possible for a visit in 3 days.   Specialty:  Family Medicine   Contact information:   [place] [address] [phone]       Follow up with [facility], DAVID W, MD. Schedule an appointment as soon as possible for a visit in 1 week.   Specialty:  Cardiology   Contact information:   [address] [place] [address] [phone]       Signed: Reyne Dumas 03/15/2015, 11:32 AM         Time spent >45 mins

## 2015-03-18 NOTE — Progress Notes (Signed)
Pt is asymptomatic. ICA in 75% range. No referral yet. Will continue to monitor noninvasively. Re check carotid dopplers in 3 montsh

## 2015-03-19 ENCOUNTER — Emergency Department (HOSPITAL_COMMUNITY): Payer: Medicare Other

## 2015-03-19 ENCOUNTER — Encounter (HOSPITAL_COMMUNITY): Payer: Self-pay | Admitting: *Deleted

## 2015-03-19 ENCOUNTER — Inpatient Hospital Stay (HOSPITAL_COMMUNITY): Payer: Medicare Other

## 2015-03-19 ENCOUNTER — Inpatient Hospital Stay (HOSPITAL_COMMUNITY)
Admission: EM | Admit: 2015-03-19 | Discharge: 2015-03-22 | DRG: 247 | Disposition: A | Discharge status: Home / Self Care | Payer: Medicare Other | Attending: Internal Medicine | Admitting: Internal Medicine

## 2015-03-19 DIAGNOSIS — Z7901 Long term (current) use of anticoagulants: Secondary | ICD-10-CM | POA: Diagnosis not present

## 2015-03-19 DIAGNOSIS — G8929 Other chronic pain: Secondary | ICD-10-CM | POA: Diagnosis present

## 2015-03-19 DIAGNOSIS — E538 Deficiency of other specified B group vitamins: Secondary | ICD-10-CM | POA: Diagnosis not present

## 2015-03-19 DIAGNOSIS — E669 Obesity, unspecified: Secondary | ICD-10-CM | POA: Diagnosis present

## 2015-03-19 DIAGNOSIS — E785 Hyperlipidemia, unspecified: Secondary | ICD-10-CM | POA: Diagnosis present

## 2015-03-19 DIAGNOSIS — I25110 Atherosclerotic heart disease of native coronary artery with unstable angina pectoris: Secondary | ICD-10-CM | POA: Diagnosis not present

## 2015-03-19 DIAGNOSIS — G43909 Migraine, unspecified, not intractable, without status migrainosus: Secondary | ICD-10-CM | POA: Diagnosis present

## 2015-03-19 DIAGNOSIS — I129 Hypertensive chronic kidney disease with stage 1 through stage 4 chronic kidney disease, or unspecified chronic kidney disease: Secondary | ICD-10-CM | POA: Diagnosis present

## 2015-03-19 DIAGNOSIS — I472 Ventricular tachycardia: Secondary | ICD-10-CM | POA: Diagnosis not present

## 2015-03-19 DIAGNOSIS — I5032 Chronic diastolic (congestive) heart failure: Secondary | ICD-10-CM

## 2015-03-19 DIAGNOSIS — Z8546 Personal history of malignant neoplasm of prostate: Secondary | ICD-10-CM

## 2015-03-19 DIAGNOSIS — M545 Low back pain: Secondary | ICD-10-CM | POA: Diagnosis present

## 2015-03-19 DIAGNOSIS — N183 Chronic kidney disease, stage 3 (moderate): Secondary | ICD-10-CM | POA: Diagnosis present

## 2015-03-19 DIAGNOSIS — I480 Paroxysmal atrial fibrillation: Secondary | ICD-10-CM | POA: Diagnosis not present

## 2015-03-19 DIAGNOSIS — R1013 Epigastric pain: Secondary | ICD-10-CM | POA: Diagnosis not present

## 2015-03-19 DIAGNOSIS — Z7982 Long term (current) use of aspirin: Secondary | ICD-10-CM | POA: Diagnosis not present

## 2015-03-19 DIAGNOSIS — R0789 Other chest pain: Secondary | ICD-10-CM | POA: Diagnosis not present

## 2015-03-19 DIAGNOSIS — I350 Nonrheumatic aortic (valve) stenosis: Secondary | ICD-10-CM

## 2015-03-19 DIAGNOSIS — Z6830 Body mass index (BMI) 30.0-30.9, adult: Secondary | ICD-10-CM

## 2015-03-19 DIAGNOSIS — I200 Unstable angina: Secondary | ICD-10-CM | POA: Diagnosis not present

## 2015-03-19 DIAGNOSIS — I5042 Chronic combined systolic (congestive) and diastolic (congestive) heart failure: Secondary | ICD-10-CM | POA: Diagnosis not present

## 2015-03-19 DIAGNOSIS — R079 Chest pain, unspecified: Secondary | ICD-10-CM | POA: Diagnosis not present

## 2015-03-19 DIAGNOSIS — Z955 Presence of coronary angioplasty implant and graft: Secondary | ICD-10-CM

## 2015-03-19 DIAGNOSIS — I10 Essential (primary) hypertension: Secondary | ICD-10-CM | POA: Diagnosis not present

## 2015-03-19 DIAGNOSIS — Z87891 Personal history of nicotine dependence: Secondary | ICD-10-CM

## 2015-03-19 DIAGNOSIS — Z85828 Personal history of other malignant neoplasm of skin: Secondary | ICD-10-CM | POA: Diagnosis not present

## 2015-03-19 DIAGNOSIS — K819 Cholecystitis, unspecified: Secondary | ICD-10-CM

## 2015-03-19 DIAGNOSIS — I6521 Occlusion and stenosis of right carotid artery: Secondary | ICD-10-CM | POA: Diagnosis present

## 2015-03-19 DIAGNOSIS — I5189 Other ill-defined heart diseases: Secondary | ICD-10-CM | POA: Diagnosis present

## 2015-03-19 LAB — I-STAT TROPONIN, ED
Troponin i, poc: 0.01 ng/mL (ref 0.00–0.08)
Troponin i, poc: 0.03 ng/mL (ref 0.00–0.08)
Troponin i, poc: 0.03 ng/mL (ref 0.00–0.08)

## 2015-03-19 LAB — I-STAT CHEM 8, ED
BUN: 41 mg/dL — AB (ref 6–20)
CHLORIDE: 108 mmol/L (ref 101–111)
CREATININE: 1.9 mg/dL — AB (ref 0.61–1.24)
Calcium, Ion: 1.21 mmol/L (ref 1.13–1.30)
GLUCOSE: 105 mg/dL — AB (ref 65–99)
HCT: 35 % — ABNORMAL LOW (ref 39.0–52.0)
Hemoglobin: 11.9 g/dL — ABNORMAL LOW (ref 13.0–17.0)
Potassium: 4.6 mmol/L (ref 3.5–5.1)
Sodium: 142 mmol/L (ref 135–145)
TCO2: 20 mmol/L (ref 0–100)

## 2015-03-19 LAB — BASIC METABOLIC PANEL
Anion gap: 8 (ref 5–15)
BUN: 40 mg/dL — AB (ref 6–20)
CALCIUM: 8.5 mg/dL — AB (ref 8.9–10.3)
CO2: 21 mmol/L — AB (ref 22–32)
Chloride: 111 mmol/L (ref 101–111)
Creatinine, Ser: 2.2 mg/dL — ABNORMAL HIGH (ref 0.61–1.24)
GFR, EST AFRICAN AMERICAN: 30 mL/min — AB (ref >60)
GFR, EST NON AFRICAN AMERICAN: 26 mL/min — AB (ref >60)
Glucose, Bld: 107 mg/dL — ABNORMAL HIGH (ref 65–99)
POTASSIUM: 4.6 mmol/L (ref 3.5–5.1)
SODIUM: 140 mmol/L (ref 135–145)

## 2015-03-19 LAB — CBC
HCT: 34.7 % — ABNORMAL LOW (ref 39.0–52.0)
HEMOGLOBIN: 11.5 g/dL — AB (ref 13.0–17.0)
MCH: 32.1 pg (ref 26.0–34.0)
MCHC: 33.1 g/dL (ref 30.0–36.0)
MCV: 96.9 fL (ref 78.0–100.0)
Platelets: 189 10*3/uL (ref 150–400)
RBC: 3.58 MIL/uL — ABNORMAL LOW (ref 4.22–5.81)
RDW: 13.1 % (ref 11.5–15.5)
WBC: 10.1 10*3/uL (ref 4.0–10.5)

## 2015-03-19 LAB — PROTIME-INR
INR: 2.09 — AB (ref 0.00–1.49)
Prothrombin Time: 23.3 seconds — ABNORMAL HIGH (ref 11.6–15.2)

## 2015-03-19 LAB — BRAIN NATRIURETIC PEPTIDE: B Natriuretic Peptide: 135.6 pg/mL — ABNORMAL HIGH (ref 0.0–100.0)

## 2015-03-19 MED ORDER — ROSUVASTATIN CALCIUM 10 MG PO TABS
5.0000 mg | ORAL_TABLET | Freq: Every day | ORAL | Status: DC
  Administered 2015-03-19 – 2015-03-21 (×3): 5 mg via ORAL
  Filled 2015-03-19 (×4): qty 1

## 2015-03-19 MED ORDER — TOPIRAMATE 25 MG PO TABS
150.0000 mg | ORAL_TABLET | Freq: Every day | ORAL | Status: DC
  Administered 2015-03-19 – 2015-03-21 (×3): 150 mg via ORAL
  Filled 2015-03-19: qty 2
  Filled 2015-03-19: qty 6
  Filled 2015-03-19 (×2): qty 2

## 2015-03-19 MED ORDER — OXYCODONE HCL 5 MG PO TABS: 5.0000 mg | ORAL_TABLET | ORAL | Status: DC | PRN

## 2015-03-19 MED ORDER — SODIUM CHLORIDE 0.9 % IV SOLN
1000.0000 mL | INTRAVENOUS | Status: DC
  Administered 2015-03-19: 1000 mL via INTRAVENOUS

## 2015-03-19 MED ORDER — HYDROCHLOROTHIAZIDE 12.5 MG PO CAPS
12.5000 mg | ORAL_CAPSULE | Freq: Every day | ORAL | Status: DC
  Administered 2015-03-20: 12.5 mg via ORAL
  Filled 2015-03-19 (×2): qty 1

## 2015-03-19 MED ORDER — DILTIAZEM HCL ER COATED BEADS 120 MG PO CP24
120.0000 mg | ORAL_CAPSULE | Freq: Every day | ORAL | Status: DC
  Administered 2015-03-21 – 2015-03-22 (×2): 120 mg via ORAL
  Filled 2015-03-19 (×3): qty 1

## 2015-03-19 MED ORDER — METOPROLOL SUCCINATE ER 25 MG PO TB24
25.0000 mg | ORAL_TABLET | Freq: Every day | ORAL | Status: DC
  Administered 2015-03-19 – 2015-03-21 (×3): 25 mg via ORAL
  Filled 2015-03-19 (×3): qty 1

## 2015-03-19 MED ORDER — MORPHINE SULFATE 2 MG/ML IJ SOLN: 1.0000 mg | INTRAMUSCULAR | Status: DC | PRN

## 2015-03-19 MED ORDER — SODIUM CHLORIDE 0.9 % IV SOLN
1000.0000 mL | INTRAVENOUS | Status: DC
  Administered 2015-03-19 – 2015-03-21 (×3): 1000 mL via INTRAVENOUS

## 2015-03-19 MED ORDER — ASPIRIN EC 81 MG PO TBEC
81.0000 mg | DELAYED_RELEASE_TABLET | Freq: Every day | ORAL | Status: DC
  Administered 2015-03-19 – 2015-03-20 (×2): 81 mg via ORAL
  Filled 2015-03-19 (×4): qty 1

## 2015-03-19 MED ORDER — BACLOFEN 10 MG PO TABS: 10.0000 mg | ORAL_TABLET | Freq: Every day | ORAL | Status: DC | PRN

## 2015-03-19 MED ORDER — MULTI-VITAMIN/MINERALS PO TABS
1.0000 | ORAL_TABLET | Freq: Every day | ORAL | Status: DC
  Filled 2015-03-19 (×3): qty 1

## 2015-03-19 MED ORDER — ACETAMINOPHEN 325 MG PO TABS: 325.0000 mg | ORAL_TABLET | Freq: Four times a day (QID) | ORAL | Status: DC | PRN

## 2015-03-19 NOTE — ED Notes (Signed)
Attempted report 

## 2015-03-19 NOTE — ED Notes (Signed)
Pt off unit with [place]

## 2015-03-19 NOTE — ED Notes (Signed)
Consulting MD at bedside

## 2015-03-19 NOTE — ED Notes (Signed)
Portable xray at bedside.

## 2015-03-19 NOTE — ED Notes (Signed)
Attempted report again. 
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
## 2015-03-19 NOTE — ED Notes (Signed)
Pt ate a nutrition bar given to him by his wife.  This RN told him he is NPO.  Pt agreed not to eat or drink any more.

## 2015-03-19 NOTE — ED Notes (Signed)
Pt was sitting in a chair this AM with a sudden onset of central pressure in his chest accompanied by SOB, Nauseated, Lightheaded, diaphoretic that lasted about 45 min.  EMS vitals, 138/76, 60, 99%, 18 resp.  20 g in LFA.  Was seen for same symptoms last Wednesday.
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
## 2015-03-20 DIAGNOSIS — E538 Deficiency of other specified B group vitamins: Secondary | ICD-10-CM

## 2015-03-20 LAB — CBC
HCT: 34 % — ABNORMAL LOW (ref 39.0–52.0)
Hemoglobin: 11.3 g/dL — ABNORMAL LOW (ref 13.0–17.0)
MCH: 32.6 pg (ref 26.0–34.0)
MCHC: 33.2 g/dL (ref 30.0–36.0)
MCV: 98 fL (ref 78.0–100.0)
Platelets: 176 10*3/uL (ref 150–400)
RBC: 3.47 MIL/uL — AB (ref 4.22–5.81)
RDW: 13.2 % (ref 11.5–15.5)
WBC: 10.4 10*3/uL (ref 4.0–10.5)

## 2015-03-20 LAB — COMPREHENSIVE METABOLIC PANEL
ALK PHOS: 44 U/L (ref 38–126)
ALT: 15 U/L — AB (ref 17–63)
AST: 19 U/L (ref 15–41)
Albumin: 2.9 g/dL — ABNORMAL LOW (ref 3.5–5.0)
Anion gap: 8 (ref 5–15)
BILIRUBIN TOTAL: 0.7 mg/dL (ref 0.3–1.2)
BUN: 33 mg/dL — ABNORMAL HIGH (ref 6–20)
CHLORIDE: 114 mmol/L — AB (ref 101–111)
CO2: 21 mmol/L — ABNORMAL LOW (ref 22–32)
Calcium: 8.4 mg/dL — ABNORMAL LOW (ref 8.9–10.3)
Creatinine, Ser: 1.79 mg/dL — ABNORMAL HIGH (ref 0.61–1.24)
GFR calc non Af Amer: 34 mL/min — ABNORMAL LOW (ref >60)
GFR, EST AFRICAN AMERICAN: 39 mL/min — AB (ref >60)
Glucose, Bld: 91 mg/dL (ref 65–99)
Potassium: 3.8 mmol/L (ref 3.5–5.1)
SODIUM: 143 mmol/L (ref 135–145)
TOTAL PROTEIN: 5.5 g/dL — AB (ref 6.5–8.1)

## 2015-03-20 LAB — PROTIME-INR
INR: 2.03 — ABNORMAL HIGH (ref 0.00–1.49)
Prothrombin Time: 22.8 seconds — ABNORMAL HIGH (ref 11.6–15.2)

## 2015-03-20 LAB — HEPARIN LEVEL (UNFRACTIONATED): HEPARIN UNFRACTIONATED: 0.54 [IU]/mL (ref 0.30–0.70)

## 2015-03-20 MED ORDER — SODIUM CHLORIDE 0.9 % IJ SOLN
3.0000 mL | Freq: Two times a day (BID) | INTRAMUSCULAR | Status: DC
  Administered 2015-03-20: 3 mL via INTRAVENOUS

## 2015-03-20 MED ORDER — SODIUM CHLORIDE 0.9 % IV SOLN: 250.0000 mL | INTRAVENOUS | Status: DC | PRN

## 2015-03-20 MED ORDER — SODIUM CHLORIDE 0.9 % IJ SOLN: 3.0000 mL | INTRAMUSCULAR | Status: DC | PRN

## 2015-03-20 MED ORDER — ASPIRIN 81 MG PO CHEW
81.0000 mg | CHEWABLE_TABLET | ORAL | Status: AC
  Administered 2015-03-21: 81 mg via ORAL
  Filled 2015-03-20: qty 1

## 2015-03-20 MED ORDER — SODIUM CHLORIDE 0.9 % WEIGHT BASED INFUSION: 1.0000 mL/kg/h | INTRAVENOUS | Status: DC

## 2015-03-20 MED ORDER — HEPARIN (PORCINE) IN NACL 100-0.45 UNIT/ML-% IJ SOLN
1100.0000 [IU]/h | INTRAMUSCULAR | Status: DC
  Administered 2015-03-20: 1100 [IU]/h via INTRAVENOUS
  Filled 2015-03-20: qty 250

## 2015-03-20 MED ORDER — SODIUM CHLORIDE 0.9 % WEIGHT BASED INFUSION
3.0000 mL/kg/h | INTRAVENOUS | Status: DC
Start: 2015-03-21 — End: 2015-03-21

## 2015-03-20 NOTE — Progress Notes (Signed)
[facility]                                                                              Patient Demographics  Mark Jackson, is a 81 y.o. male, DOB - 20-Oct-1929, [id number]  Admit date - 03/19/2015   Admitting Physician Cherene Altes, MD  Outpatient Primary MD for the patient is [name], [name], MD  LOS - 1   Chief Complaint  Patient presents with  . Chest Pain       Brief HPI  81 y.o. male with a history of chronic kidney disease stage III, mild aortic stenosis, moderate to severe right carotid stenosis, paroxysmal atrial fibrillation, hypertension, hyperlipidemia, and chronic combined systolic and diastolic heart failure who was admitted to the hospital 03/13/2015 with complaints of chest pain. He underwent a thorough cardiac evaluation at time and was seen by cardiology who recommended outpatient follow-up due to his chronic kidney disease and fear the cardiac cath would worsen his renal function. The patient was discharged home on 03/15/2015. On the morning of admission, when walking in his home he developed the acute recurrence of pain in the chest with associated dyspnea and diaphoresis. He was also nauseous but did not vomit. The pain was described as a pressure type sensation in the central portion of his chest without radiation, and was similar to the pain he experienced leading to his prior hospital stay. In the emergency room cardiology was consulted.   Assessment & Plan    Principal Problem:   Chest pain concerning for unstable angina - Cardiology consulted, recommended checking right upper quadrant ultrasound which was negative for any cholecystitis - Planning for cardiac cath in a.m., will need hydration prior to the cath - Currently chest pain resolved  Active Problems: Mild aortic stenosis: No evidence to suggest acute complications  Acute on Chronic kidney disease stage III - Creatinine function improving, 2.2 at the time of admission,  1.7 today, will need mild hydration prior to cardiac cath to avoid contrast nephropathy.   Moderate to severe right carotid stenosis Being followed by cardiology in the outpatient setting - presently asymptomatic  Paroxysmal atrial fibrillation  currently normal sinus rhythm, continue beta blocker  Continue IV heparin, patient was on Coumadin prior to admission, INR 2.0   Hypertension Currently well-controlled  Hyperlipidemia Continue oral therapy  Chronic combined systolic and diastolic congestive heart failure EF 50-55% per echo 03/14/2015  Code Status: Full CODE STATUS   Family Communication: Discussed in detail with the patient, all imaging results, lab results explained to the patient    Disposition Plan: Not medically ready   Time Spent in minutes  25 minutes  Procedures    Consults   Cardiology   DVT Prophylaxis  IV heparin drip  Medications   Scheduled Meds: . aspirin EC  81 mg Oral QHS  . diltiazem  120 mg Oral Daily  . hydrochlorothiazide  12.5 mg Oral Daily  . metoprolol succinate  25 mg Oral QHS  . multivitamin with minerals  1 tablet Oral Daily  . rosuvastatin  5 mg Oral Daily  . topiramate  150 mg Oral QHS  Continuous Infusions: . sodium chloride 1,000 mL (03/20/15 0604)  . heparin     PRN Meds:.acetaminophen, baclofen, morphine injection, oxyCODONE   Antibiotics   Anti-infectives    None        Subjective:   Mark Jackson was seen and examined today. Patient denies dizziness, chest pain, shortness of breath, abdominal pain, N/V/D/C, new weakness, numbess, tingling. No acute events overnight.    Objective:   Blood pressure 140/76, pulse 55, temperature 98.1 F (36.7 C), temperature source Oral, resp. rate 16, height 5\' 8"  (1.727 m), weight 90.629 kg (199 lb 12.8 oz), SpO2 98 %.  Wt Readings from Last 3 Encounters:  03/19/15 90.629 kg (199 lb 12.8 oz)  03/15/15 92.352 kg (203 lb 9.6 oz)  02/20/15 91.944 kg (202 lb 11.2 oz)      Intake/Output Summary (Last 24 hours) at 03/20/15 1219 Last data filed at 03/20/15 0600  Gross per 24 hour  Intake      0 ml  Output    450 ml  Net   -450 ml    Exam  General: Alert and oriented x 3, NAD  HEENT:  PERRLA, EOMI, Anicteric Sclera, mucous membranes moist.   Neck: Supple, no JVD, no masses  CVS: S1 S2 auscultated, no rubs, murmurs or gallops. Regular rate and rhythm.  Respiratory: Clear to auscultation bilaterally, no wheezing, rales or rhonchi  Abdomen: Soft, nontender, nondistended, + bowel sounds  Ext: no cyanosis clubbing, 2+ edema b/l LE  Neuro: AAOx3, Cr N's II- XII. Strength 5/5 upper and lower extremities bilaterally  Skin: No rashes  Psych: Normal affect and demeanor, alert and oriented x3    Data Review   Micro Results No results found for this or any previous visit (from the past 240 hour(s)).  Radiology Reports Dg Chest 2 View  03/13/2015   CLINICAL DATA:  Acute onset chest pain earlier today. Atrial fibrillation.  EXAM: CHEST  2 VIEW  COMPARISON:  08/20/2014  FINDINGS: The heart size and mediastinal contours are within normal limits. Both lungs are clear. No evidence of pleural effusion. Mild thoracic spine degenerative disc disease noted.  IMPRESSION: No active cardiopulmonary disease.   Electronically Signed   By: Earle Gell M.D.   On: 03/13/2015 16:31   Dg Chest Portable 1 View  03/19/2015   CLINICAL DATA:  Chest pain this morning, history essential hypertension, LEFT ventricular diastolic dysfunction, obesity, atrial fibrillation, prostate cancer, stage 3 chronic kidney disease  EXAM: PORTABLE CHEST - 1 VIEW  COMPARISON:  Portable exam 1056 hours compared 03/13/2015  FINDINGS: Upper normal heart size, mediastinal contours and pulmonary vascularity.  Atherosclerotic calcification aorta.  Lungs clear.  No pleural effusion or pneumothorax.  Bones demineralized.  IMPRESSION: No acute abnormalities.   Electronically Signed   By: Lavonia Dana M.D.    On: 03/19/2015 11:21   US Abdomen Limited Ruq  03/19/2015   CLINICAL DATA:  Acute onset of chest and epigastric pain today.  EXAM: US ABDOMEN LIMITED - RIGHT UPPER QUADRANT  COMPARISON:  CT scan 05/23/2009  FINDINGS: Gallbladder:  No gallstones or wall thickening visualized. No sonographic Murphy sign noted.  Common bile duct:  Diameter: 5.8 mm  Liver:  Normal echogenicity without focal lesion or biliary dilatation.  IMPRESSION: Unremarkable right upper quadrant ultrasound examination.   Electronically Signed   By: Marijo Sanes M.D.   On: 03/19/2015 16:46    CBC  Recent Labs Lab 03/13/15 1607 03/15/15 0507 03/19/15 1039 03/19/15 1054 03/20/15 0401  WBC 10.1 7.8 10.1  --  10.4  HGB 12.1* 10.8* 11.5* 11.9* 11.3*  HCT 36.6* 33.3* 34.7* 35.0* 34.0*  PLT 193 182 189  --  176  MCV 97.9 97.1 96.9  --  98.0  MCH 32.4 31.5 32.1  --  32.6  MCHC 33.1 32.4 33.1  --  33.2  RDW 13.0 13.0 13.1  --  13.2    Chemistries   Recent Labs Lab 03/13/15 1607 03/14/15 1200 03/19/15 1039 03/19/15 1054 03/20/15 0401  NA 142 140 140 142 143  K 4.4 4.3 4.6 4.6 3.8  CL 112* 113* 111 108 114*  CO2 24 21* 21*  --  21*  GLUCOSE 89 90 107* 105* 91  BUN 46* 39* 40* 41* 33*  CREATININE 2.37* 1.92* 2.20* 1.90* 1.79*  CALCIUM 8.5* 8.3* 8.5*  --  8.4*  AST  --  21  --   --  19  ALT  --  13*  --   --  15*  ALKPHOS  --  42  --   --  44  BILITOT  --  0.7  --   --  0.7   ------------------------------------------------------------------------------------------------------------------ estimated creatinine clearance is 34.8 mL/min (by C-G formula based on Cr of 1.79). ------------------------------------------------------------------------------------------------------------------ No results for input(s): HGBA1C in the last 72 hours. ------------------------------------------------------------------------------------------------------------------ No results for input(s): CHOL, HDL, LDLCALC, TRIG, CHOLHDL,  LDLDIRECT in the last 72 hours. ------------------------------------------------------------------------------------------------------------------ No results for input(s): TSH, T4TOTAL, T3FREE, THYROIDAB in the last 72 hours.  Invalid input(s): FREET3 ------------------------------------------------------------------------------------------------------------------ No results for input(s): VITAMINB12, FOLATE, FERRITIN, TIBC, IRON, RETICCTPCT in the last 72 hours.  Coagulation profile  Recent Labs Lab 03/13/15 1607 03/14/15 1300 03/15/15 0507 03/19/15 1039 03/20/15 0401  INR 2.31* 2.48* 2.62* 2.09* 2.03*    No results for input(s): DDIMER in the last 72 hours.  Cardiac Enzymes  Recent Labs Lab 03/13/15 2233 03/14/15 03/14/15 0331  TROPONINI 0.06* 0.05* 0.05*   ------------------------------------------------------------------------------------------------------------------ Invalid input(s): POCBNP  No results for input(s): GLUCAP in the last 72 hours.   Lindell Tussey M.D. [facility] 03/20/2015, 12:19 PM  Pager: [phone] Between 7am to 7pm - call Pager - [phone]  After 7pm go to [email or web address] - password [email or web address]  Call night coverage person covering after 7pm

## 2015-03-20 NOTE — Progress Notes (Signed)
UR Completed Labrenda Lasky Graves-Bigelow, RN,BSN [phone]  

## 2015-03-20 NOTE — Progress Notes (Signed)
ANTICOAGULATION CONSULT NOTE - Follow Up Consult  Pharmacy Consult for Heparin Indication: hx atrial fibrillation (Coumadin PTA); recurrent CP  Allergies  Allergen Reactions  . Plavix [Clopidogrel Bisulfate]     Significant bruising    Patient Measurements: Height: 5\' 8"  (172.7 cm) Weight: 199 lb 12.8 oz (90.629 kg) IBW/kg (Calculated) : 68.4 Heparin Dosing Weight: 87 kg  Vital Signs: Temp: 97.8 F (36.6 C) (08/10 2023) Temp Source: Oral (08/10 2023) BP: 169/77 mmHg (08/10 2023) Pulse Rate: 63 (08/10 2023)  Labs:  Recent Labs  03/19/15 1039 03/19/15 1054 03/20/15 0401 03/20/15 1946  HGB 11.5* 11.9* 11.3*  --   HCT 34.7* 35.0* 34.0*  --   PLT 189  --  176  --   LABPROT 23.3*  --  22.8*  --   INR 2.09*  --  2.03*  --   HEPARINUNFRC  --   --   --  0.54  CREATININE 2.20* 1.90* 1.79*  --     Estimated Creatinine Clearance: 34.8 mL/min (by C-G formula based on Cr of 1.79).  Assessment:  Heparin level is therapeutic (0.54) on 1100 units/hr.  Coumadin on hold for cardiac cath.  Goal of Therapy:  Heparin level 0.3-0.7 units/ml Monitor platelets by anticoagulation protocol: Yes   Plan:   Continue heparin drip at 1100 units/hr.  Daily heparin level, PT/INR and CBC.  Arty Baumgartner, [place] Pager: [phone] 03/20/2015,9:27 PM
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
## 2015-03-21 ENCOUNTER — Encounter (HOSPITAL_COMMUNITY)
Admission: EM | Disposition: A | Discharge status: Home / Self Care | Payer: Self-pay | Source: Home / Self Care | Attending: Internal Medicine

## 2015-03-21 ENCOUNTER — Encounter (HOSPITAL_COMMUNITY): Payer: Self-pay | Admitting: Interventional Cardiology

## 2015-03-21 ENCOUNTER — Other Ambulatory Visit: Payer: Self-pay

## 2015-03-21 DIAGNOSIS — I25110 Atherosclerotic heart disease of native coronary artery with unstable angina pectoris: Principal | ICD-10-CM

## 2015-03-21 DIAGNOSIS — Z9861 Coronary angioplasty status: Secondary | ICD-10-CM

## 2015-03-21 DIAGNOSIS — I2510 Atherosclerotic heart disease of native coronary artery without angina pectoris: Secondary | ICD-10-CM

## 2015-03-21 HISTORY — PX: CARDIAC CATHETERIZATION: SHX172

## 2015-03-21 HISTORY — DX: Atherosclerotic heart disease of native coronary artery without angina pectoris: I25.10

## 2015-03-21 HISTORY — DX: Coronary angioplasty status: Z98.61

## 2015-03-21 LAB — CBC
HEMATOCRIT: 31.1 % — AB (ref 39.0–52.0)
Hemoglobin: 10.5 g/dL — ABNORMAL LOW (ref 13.0–17.0)
MCH: 32.9 pg (ref 26.0–34.0)
MCHC: 33.8 g/dL (ref 30.0–36.0)
MCV: 97.5 fL (ref 78.0–100.0)
PLATELETS: 185 10*3/uL (ref 150–400)
RBC: 3.19 MIL/uL — AB (ref 4.22–5.81)
RDW: 13.1 % (ref 11.5–15.5)
WBC: 11.2 10*3/uL — AB (ref 4.0–10.5)

## 2015-03-21 LAB — POCT ACTIVATED CLOTTING TIME: ACTIVATED CLOTTING TIME: 343 s

## 2015-03-21 LAB — HEPARIN LEVEL (UNFRACTIONATED): Heparin Unfractionated: 0.65 IU/mL (ref 0.30–0.70)

## 2015-03-21 LAB — PROTIME-INR
INR: 1.85 — AB (ref 0.00–1.49)
Prothrombin Time: 21.3 seconds — ABNORMAL HIGH (ref 11.6–15.2)

## 2015-03-21 SURGERY — LEFT HEART CATH AND CORONARY ANGIOGRAPHY
Anesthesia: LOCAL

## 2015-03-21 MED ORDER — FENTANYL CITRATE (PF) 100 MCG/2ML IJ SOLN
INTRAMUSCULAR | Status: DC | PRN
  Administered 2015-03-21 (×2): 25 ug via INTRAVENOUS

## 2015-03-21 MED ORDER — ACETAMINOPHEN 325 MG PO TABS: 650.0000 mg | ORAL_TABLET | ORAL | Status: DC | PRN

## 2015-03-21 MED ORDER — CANGRELOR BOLUS VIA INFUSION
INTRAVENOUS | Status: DC | PRN
  Administered 2015-03-21: 2718 ug via INTRAVENOUS

## 2015-03-21 MED ORDER — LIDOCAINE HCL (PF) 1 % IJ SOLN
INTRAMUSCULAR | Status: AC
  Filled 2015-03-21: qty 30

## 2015-03-21 MED ORDER — NITROGLYCERIN 1 MG/10 ML FOR IR/CATH LAB
INTRA_ARTERIAL | Status: DC | PRN
  Administered 2015-03-21: 200 ug

## 2015-03-21 MED ORDER — HEPARIN SODIUM (PORCINE) 1000 UNIT/ML IJ SOLN
INTRAMUSCULAR | Status: DC | PRN
  Administered 2015-03-21 (×2): 5000 [IU] via INTRAVENOUS

## 2015-03-21 MED ORDER — HYDRALAZINE HCL 20 MG/ML IJ SOLN
INTRAMUSCULAR | Status: AC
  Filled 2015-03-21: qty 1

## 2015-03-21 MED ORDER — MIDAZOLAM HCL 2 MG/2ML IJ SOLN
INTRAMUSCULAR | Status: AC
  Filled 2015-03-21: qty 4

## 2015-03-21 MED ORDER — ONDANSETRON HCL 4 MG/2ML IJ SOLN: 4.0000 mg | Freq: Four times a day (QID) | INTRAMUSCULAR | Status: DC | PRN

## 2015-03-21 MED ORDER — VERAPAMIL HCL 2.5 MG/ML IV SOLN
INTRAVENOUS | Status: AC
  Filled 2015-03-21: qty 2

## 2015-03-21 MED ORDER — ASPIRIN 81 MG PO CHEW
81.0000 mg | CHEWABLE_TABLET | Freq: Every day | ORAL | Status: DC
  Filled 2015-03-21: qty 1

## 2015-03-21 MED ORDER — IOHEXOL 350 MG/ML SOLN
INTRAVENOUS | Status: DC | PRN
  Administered 2015-03-21: 70 mL via INTRAVENOUS

## 2015-03-21 MED ORDER — SODIUM CHLORIDE 0.9 % IJ SOLN
3.0000 mL | Freq: Two times a day (BID) | INTRAMUSCULAR | Status: DC
  Administered 2015-03-21: 22:00:00 3 mL via INTRAVENOUS

## 2015-03-21 MED ORDER — MIDAZOLAM HCL 2 MG/2ML IJ SOLN
INTRAMUSCULAR | Status: DC | PRN
  Administered 2015-03-21: 1 mg via INTRAVENOUS
  Administered 2015-03-21: 2 mg via INTRAVENOUS

## 2015-03-21 MED ORDER — HEPARIN SODIUM (PORCINE) 1000 UNIT/ML IJ SOLN
INTRAMUSCULAR | Status: AC
  Filled 2015-03-21: qty 1

## 2015-03-21 MED ORDER — HYDRALAZINE HCL 20 MG/ML IJ SOLN
10.0000 mg | INTRAMUSCULAR | Status: DC | PRN
  Administered 2015-03-21 (×2): 10 mg via INTRAVENOUS

## 2015-03-21 MED ORDER — TICAGRELOR 90 MG PO TABS
90.0000 mg | ORAL_TABLET | Freq: Two times a day (BID) | ORAL | Status: DC
  Administered 2015-03-21 – 2015-03-22 (×2): 90 mg via ORAL
  Filled 2015-03-21 (×2): qty 1

## 2015-03-21 MED ORDER — SODIUM CHLORIDE 0.9 % IV SOLN: 250.0000 mL | INTRAVENOUS | Status: DC | PRN

## 2015-03-21 MED ORDER — CANGRELOR TETRASODIUM 50 MG IV SOLR
INTRAVENOUS | Status: AC
  Filled 2015-03-21: qty 50

## 2015-03-21 MED ORDER — CANGRELOR TETRASODIUM 50 MG IV SOLR
50000.0000 ug | INTRAVENOUS | Status: DC | PRN
  Administered 2015-03-21: 4 ug/kg/min via INTRAVENOUS

## 2015-03-21 MED ORDER — SODIUM CHLORIDE 0.9 % WEIGHT BASED INFUSION
1.0000 mL/kg/h | INTRAVENOUS | Status: DC
  Administered 2015-03-21: 1 mL/kg/h via INTRAVENOUS

## 2015-03-21 MED ORDER — SODIUM CHLORIDE 0.9 % IJ SOLN: 3.0000 mL | INTRAMUSCULAR | Status: DC | PRN

## 2015-03-21 MED ORDER — TICAGRELOR 90 MG PO TABS
ORAL_TABLET | ORAL | Status: DC | PRN
  Administered 2015-03-21: 180 mg via ORAL

## 2015-03-21 MED ORDER — TICAGRELOR 90 MG PO TABS
ORAL_TABLET | ORAL | Status: AC
  Filled 2015-03-21: qty 1

## 2015-03-21 MED ORDER — FENTANYL CITRATE (PF) 100 MCG/2ML IJ SOLN
INTRAMUSCULAR | Status: AC
  Filled 2015-03-21: qty 4

## 2015-03-21 MED ORDER — HEPARIN (PORCINE) IN NACL 2-0.9 UNIT/ML-% IJ SOLN
INTRAMUSCULAR | Status: AC
  Filled 2015-03-21: qty 1000

## 2015-03-21 MED ORDER — NITROGLYCERIN 1 MG/10 ML FOR IR/CATH LAB
INTRA_ARTERIAL | Status: AC
  Filled 2015-03-21: qty 10

## 2015-03-21 SURGICAL SUPPLY — 21 items
BALLN EMERGE MR 2.5X12 (BALLOONS) ×3
BALLN ~~LOC~~ TREK RX 3.25X8 (BALLOONS) ×2 IMPLANT
BALLOON EMERGE MR 2.5X12 (BALLOONS) IMPLANT
CATH INFINITI 5 FR JL3.5 (CATHETERS) ×3 IMPLANT
CATH INFINITI 5FR ANG PIGTAIL (CATHETERS) ×3 IMPLANT
CATH INFINITI JR4 5F (CATHETERS) ×3 IMPLANT
DEVICE RAD COMP TR BAND LRG (VASCULAR PRODUCTS) ×3 IMPLANT
GLIDESHEATH SLEND SS 6F .021 (SHEATH) ×3 IMPLANT
GUIDE CATH RUNWAY 6FR CLS3 (CATHETERS) ×2 IMPLANT
KIT ENCORE 26 ADVANTAGE (KITS) ×2 IMPLANT
KIT HEART LEFT (KITS) ×3 IMPLANT
PACK CARDIAC CATHETERIZATION (CUSTOM PROCEDURE TRAY) ×3 IMPLANT
STENT SYNERGY DES 2.75X16 (Permanent Stent) ×1 IMPLANT
SYR MEDRAD MARK V 150ML (SYRINGE) ×3 IMPLANT
TRANSDUCER W/STOPCOCK (MISCELLANEOUS) ×3 IMPLANT
TUBING CIL FLEX 10 FLL-RA (TUBING) ×3 IMPLANT
VALVE GUARDIAN II ~~LOC~~ HEMO (MISCELLANEOUS) ×2 IMPLANT
WIRE ASAHI PROWATER 180CM (WIRE) ×1 IMPLANT
WIRE HI TORQ BMW 190CM (WIRE) ×3 IMPLANT
WIRE HI TORQ VERSACORE-J 145CM (WIRE) ×1 IMPLANT
WIRE SAFE-T 1.5MM-J .035X260CM (WIRE) ×3 IMPLANT

## 2015-03-21 NOTE — Progress Notes (Signed)
Hematoma noted to right radial area.  MD made aware.  Cangrelor stopped.  TR band repositioned.  Manual pressure held for 45 minutes.  A second TR band was applied to aide in maintaining hemostasis at 1200.  Site is now soft with no hematoma present.  Bruising noted to site.  Radial pulse present.  Capillary refill is 3 seconds.

## 2015-03-21 NOTE — Progress Notes (Signed)
12 lead EKG done. Spoke w/ Dr. Rockwell Germany; IVF bolus infusing. HR back up 80's SR. BP improved.

## 2015-03-21 NOTE — Progress Notes (Signed)
[facility]                                                                              Patient Demographics  Mark Jackson, is a 81 y.o. male, DOB - [birth year], [id number]  Admit date - 03/19/2015   Admitting Physician Cherene Altes, MD  Outpatient Primary MD for the patient is [name], [name], MD  LOS - 2   Chief Complaint  Patient presents with  . Chest Pain       Brief HPI  81 y.o. male with a history of chronic kidney disease stage III, mild aortic stenosis, moderate to severe right carotid stenosis, paroxysmal atrial fibrillation, hypertension, hyperlipidemia, and chronic combined systolic and diastolic heart failure who was admitted to the hospital 03/13/2015 with complaints of chest pain. He underwent a thorough cardiac evaluation at time and was seen by cardiology who recommended outpatient follow-up due to his chronic kidney disease and fear the cardiac cath would worsen his renal function. The patient was discharged home on 03/15/2015. On the morning of admission, when walking in his home he developed the acute recurrence of pain in the chest with associated dyspnea and diaphoresis. He was also nauseous but did not vomit. The pain was described as a pressure type sensation in the central portion of his chest without radiation, and was similar to the pain he experienced leading to his prior hospital stay. In the emergency room cardiology was consulted.   Assessment & Plan    Principal Problem:   Chest pain/ unstable angina - Cardiology was consulted, recommended checking right upper quadrant ultrasound which was negative for any cholecystitis - cardiac cath today with proximal LAD lesion 80% stenosed, DES stent placed   - cardiology recommended 36months of brilinta with coumadin, continue aspirin until coumadin is therapeutic  Active Problems: Mild aortic stenosis: No evidence to suggest acute complications  Acute on Chronic kidney disease stage  III - follow Cr function closely  Moderate to severe right carotid stenosis Being followed by cardiology in the outpatient setting - presently asymptomatic  Paroxysmal atrial fibrillation currently normal sinus rhythm, continue beta blocker  patient was on Coumadin prior to admission, INR 1.8, will await cardiology recommendations regarding when to restart coumadin     Hypertension Currently well-controlled  Hyperlipidemia Continue oral therapy  Chronic combined systolic and diastolic congestive heart failure EF 50-55% per echo 03/14/2015  Code Status: Full CODE STATUS   Family Communication: Discussed in detail with the patient, all imaging results, lab results explained to the patient, daughter and wife at bed side    Disposition Plan: Not medically ready   Time Spent in minutes  25 minutes  Procedures    Consults   Cardiology   DVT Prophylaxis  IV heparin drip  Medications   Scheduled Meds: . [START ON 03/22/2015] aspirin  81 mg Oral Daily  . diltiazem  120 mg Oral Daily  . metoprolol succinate  25 mg Oral QHS  . multivitamin with minerals  1 tablet Oral Daily  . rosuvastatin  5 mg Oral Daily  . sodium chloride  3 mL Intravenous Q12H  . ticagrelor  90 mg Oral BID  . topiramate  150 mg Oral QHS   Continuous Infusions: . sodium chloride 1,000 mL (03/21/15 0245)   PRN Meds:.sodium chloride, acetaminophen, acetaminophen, baclofen, hydrALAZINE, morphine injection, ondansetron (ZOFRAN) IV, oxyCODONE, sodium chloride   Antibiotics   Anti-infectives    None        Subjective:   Oda Trevizo was seen and examined today. See after cardiac cath today. Patient denies dizziness, chest pain, shortness of breath, abdominal pain, N/V/D/C, new weakness, numbess, tingling.   Objective:   Blood pressure 120/35, pulse 83, temperature 97.8 F (36.6 C), temperature source Oral, resp. rate 24, height 5\' 8"  (1.727 m), weight 90.629 kg (199 lb 12.8 oz), SpO2 100  %.  Wt Readings from Last 3 Encounters:  03/19/15 90.629 kg (199 lb 12.8 oz)  03/15/15 92.352 kg (203 lb 9.6 oz)  02/20/15 91.944 kg (202 lb 11.2 oz)     Intake/Output Summary (Last 24 hours) at 03/21/15 1521 Last data filed at 03/21/15 [id number]  Gross per 24 hour  Intake   2577 ml  Output   1050 ml  Net   1527 ml    Exam  General: Alert and oriented x 3, NAD  HEENT:  PERRLA, EOMI, Anicteric Sclera, mucous membranes moist.   Neck: Supple, no JVD, no masses  CVS: S1 S2 clear, RRR  Respiratory: CTAB  Abdomen: Soft, nontender, nondistended, + bowel sounds  Ext: no cyanosis clubbing, 2+ edema b/l LE  Neuro: no new deficits  Skin: No rashes  Psych: Normal affect and demeanor, alert and oriented x3    Data Review   Micro Results No results found for this or any previous visit (from the past 240 hour(s)).  Radiology Reports Dg Chest 2 View  03/13/2015   CLINICAL DATA:  Acute onset chest pain earlier today. Atrial fibrillation.  EXAM: CHEST  2 VIEW  COMPARISON:  08/20/2014  FINDINGS: The heart size and mediastinal contours are within normal limits. Both lungs are clear. No evidence of pleural effusion. Mild thoracic spine degenerative disc disease noted.  IMPRESSION: No active cardiopulmonary disease.   Electronically Signed   By: Earle Gell M.D.   On: 03/13/2015 16:31   Dg Chest Portable 1 View  03/19/2015   CLINICAL DATA:  Chest pain this morning, history essential hypertension, LEFT ventricular diastolic dysfunction, obesity, atrial fibrillation, prostate cancer, stage 3 chronic kidney disease  EXAM: PORTABLE CHEST - 1 VIEW  COMPARISON:  Portable exam 1056 hours compared 03/13/2015  FINDINGS: Upper normal heart size, mediastinal contours and pulmonary vascularity.  Atherosclerotic calcification aorta.  Lungs clear.  No pleural effusion or pneumothorax.  Bones demineralized.  IMPRESSION: No acute abnormalities.   Electronically Signed   By: Lavonia Dana M.D.   On: 03/19/2015  11:21   US Abdomen Limited Ruq  03/19/2015   CLINICAL DATA:  Acute onset of chest and epigastric pain today.  EXAM: US ABDOMEN LIMITED - RIGHT UPPER QUADRANT  COMPARISON:  CT scan 05/23/2009  FINDINGS: Gallbladder:  No gallstones or wall thickening visualized. No sonographic Murphy sign noted.  Common bile duct:  Diameter: 5.8 mm  Liver:  Normal echogenicity without focal lesion or biliary dilatation.  IMPRESSION: Unremarkable right upper quadrant ultrasound examination.   Electronically Signed   By: Marijo Sanes M.D.   On: 03/19/2015 16:46    CBC  Recent Labs Lab 03/15/15 0507 03/19/15 1039 03/19/15 1054 03/20/15 0401 03/21/15 0418  WBC 7.8 10.1  --  10.4 11.2*  HGB 10.8*  11.5* 11.9* 11.3* 10.5*  HCT 33.3* 34.7* 35.0* 34.0* 31.1*  PLT 182 189  --  176 185  MCV 97.1 96.9  --  98.0 97.5  MCH 31.5 32.1  --  32.6 32.9  MCHC 32.4 33.1  --  33.2 33.8  RDW 13.0 13.1  --  13.2 13.1    Chemistries   Recent Labs Lab 03/19/15 1039 03/19/15 1054 03/20/15 0401  NA 140 142 143  K 4.6 4.6 3.8  CL 111 108 114*  CO2 21*  --  21*  GLUCOSE 107* 105* 91  BUN 40* 41* 33*  CREATININE 2.20* 1.90* 1.79*  CALCIUM 8.5*  --  8.4*  AST  --   --  19  ALT  --   --  15*  ALKPHOS  --   --  44  BILITOT  --   --  0.7   ------------------------------------------------------------------------------------------------------------------ estimated creatinine clearance is 34.8 mL/min (by C-G formula based on Cr of 1.79). ------------------------------------------------------------------------------------------------------------------ No results for input(s): HGBA1C in the last 72 hours. ------------------------------------------------------------------------------------------------------------------ No results for input(s): CHOL, HDL, LDLCALC, TRIG, CHOLHDL, LDLDIRECT in the last 72  hours. ------------------------------------------------------------------------------------------------------------------ No results for input(s): TSH, T4TOTAL, T3FREE, THYROIDAB in the last 72 hours.  Invalid input(s): FREET3 ------------------------------------------------------------------------------------------------------------------ No results for input(s): VITAMINB12, FOLATE, FERRITIN, TIBC, IRON, RETICCTPCT in the last 72 hours.  Coagulation profile  Recent Labs Lab 03/15/15 0507 03/19/15 1039 03/20/15 0401 03/21/15 0418  INR 2.62* 2.09* 2.03* 1.85*    No results for input(s): DDIMER in the last 72 hours.  Cardiac Enzymes No results for input(s): CKMB, TROPONINI, MYOGLOBIN in the last 168 hours.  Invalid input(s): CK ------------------------------------------------------------------------------------------------------------------ Invalid input(s): POCBNP  No results for input(s): GLUCAP in the last 72 hours.   Marilu Rylander M.D. [facility] 03/21/2015, 3:21 PM  Pager: [phone] Between 7am to 7pm - call Pager - [phone]  After 7pm go to [email or web address] - password [email or web address]  Call night coverage person covering after 7pm

## 2015-03-21 NOTE — H&P (View-Only) (Signed)
Patient Name: Mark Jackson. Date of Encounter: 03/20/2015  Primary Cardiologist Dr. Ellyn Hack   Principal Problem:   Chest pain Active Problems:   Essential hypertension   Hyperlipidemia   Paroxysmal atrial fibrillation   Chronic combined systolic and diastolic heart failure   Aortic stenosis   Angina pectoris, unstable    SUBJECTIVE  Denies any CP or SOB. Last episode of CP yesterday  CURRENT MEDS . aspirin EC  81 mg Oral QHS  . diltiazem  120 mg Oral Daily  . hydrochlorothiazide  12.5 mg Oral Daily  . metoprolol succinate  25 mg Oral QHS  . multivitamin with minerals  1 tablet Oral Daily  . rosuvastatin  5 mg Oral Daily  . topiramate  150 mg Oral QHS    OBJECTIVE  Filed Vitals:   03/19/15 1930 03/19/15 1934 03/19/15 2020 03/20/15 0500  BP:  187/80 167/83 124/56  Pulse: 60 62 61 59  Temp:   97.7 F (36.5 C) 98.1 F (36.7 C)  TempSrc:   Oral Oral  Resp: 13 15 16    Height:   5\' 8"  (1.727 m)   Weight:   199 lb 12.8 oz (90.629 kg)   SpO2: 100% 100% 100% 98%    Intake/Output Summary (Last 24 hours) at 03/20/15 1004 Last data filed at 03/20/15 0600  Gross per 24 hour  Intake      0 ml  Output    450 ml  Net   -450 ml   Filed Weights   03/19/15 1025 03/19/15 2020  Weight: 199 lb (90.266 kg) 199 lb 12.8 oz (90.629 kg)    PHYSICAL EXAM  General: Pleasant, NAD. Neuro: Alert and oriented X 3. Moves all extremities spontaneously. Psych: Normal affect. HEENT:  Normal  Neck: Supple without bruits or JVD. Lungs:  Resp regular and unlabored, CTA. Heart: RRR no s3, s4, or murmurs. Abdomen: Soft, non-tender, non-distended, BS + x 4.  Extremities: No clubbing, cyanosis. DP/PT/Radials 2+ and equal bilaterally. 2+ pitting edema in the bilateral LE  Accessory Clinical Findings  CBC  Recent Labs  03/19/15 1039 03/19/15 1054 03/20/15 0401  WBC 10.1  --  10.4  HGB 11.5* 11.9* 11.3*  HCT 34.7* 35.0* 34.0*  MCV 96.9  --  98.0  PLT 189  --  0000000   Basic  Metabolic Panel  Recent Labs  03/19/15 1039 03/19/15 1054 03/20/15 0401  NA 140 142 143  K 4.6 4.6 3.8  CL 111 108 114*  CO2 21*  --  21*  GLUCOSE 107* 105* 91  BUN 40* 41* 33*  CREATININE 2.20* 1.90* 1.79*  CALCIUM 8.5*  --  8.4*   Liver Function Tests  Recent Labs  03/20/15 0401  AST 19  ALT 15*  ALKPHOS 44  BILITOT 0.7  PROT 5.5*  ALBUMIN 2.9*    TELE NSR without significant ST-T wave changes    ECG  NSR without significant ST-T wave changes  Echocardiogram 03/14/2015  LV EF: 50% -  55%  ------------------------------------------------------------------- Indications:   Chest pain 786.51.  ------------------------------------------------------------------- History:  PMH: Mild Aortic stenosis. Dyspnea. Atrial fibrillation. Congestive heart failure. Risk factors: Hypertension.  ------------------------------------------------------------------- Study Conclusions  - Left ventricle: The cavity size was normal. Wall thickness was increased in a pattern of mild LVH. Systolic function was normal. The estimated ejection fraction was in the range of 50% to 55%. Although no diagnostic regional wall motion abnormality was identified, this possibility cannot be completely excluded on the basis of this  study. Left ventricular diastolic function parameters were normal. - Aortic valve: There was mild stenosis. There was trivial regurgitation. Valve area (VTI): 1.74 cm^2. Valve area (Vmax): 1.6 cm^2. Valve area (Vmean): 1.46 cm^2. - Left atrium: The atrium was mildly dilated.    Radiology/Studies  Dg Chest 2 View  03/13/2015   CLINICAL DATA:  Acute onset chest pain earlier today. Atrial fibrillation.  EXAM: CHEST  2 VIEW  COMPARISON:  08/20/2014  FINDINGS: The heart size and mediastinal contours are within normal limits. Both lungs are clear. No evidence of pleural effusion. Mild thoracic spine degenerative disc disease noted.  IMPRESSION:  No active cardiopulmonary disease.   Electronically Signed   By: Earle Gell M.D.   On: 03/13/2015 16:31   Dg Chest Portable 1 View  03/19/2015   CLINICAL DATA:  Chest pain this morning, history essential hypertension, LEFT ventricular diastolic dysfunction, obesity, atrial fibrillation, prostate cancer, stage 3 chronic kidney disease  EXAM: PORTABLE CHEST - 1 VIEW  COMPARISON:  Portable exam 1056 hours compared 03/13/2015  FINDINGS: Upper normal heart size, mediastinal contours and pulmonary vascularity.  Atherosclerotic calcification aorta.  Lungs clear.  No pleural effusion or pneumothorax.  Bones demineralized.  IMPRESSION: No acute abnormalities.   Electronically Signed   By: Lavonia Dana M.D.   On: 03/19/2015 11:21   US Abdomen Limited Ruq  03/19/2015   CLINICAL DATA:  Acute onset of chest and epigastric pain today.  EXAM: US ABDOMEN LIMITED - RIGHT UPPER QUADRANT  COMPARISON:  CT scan 05/23/2009  FINDINGS: Gallbladder:  No gallstones or wall thickening visualized. No sonographic Murphy sign noted.  Common bile duct:  Diameter: 5.8 mm  Liver:  Normal echogenicity without focal lesion or biliary dilatation.  IMPRESSION: Unremarkable right upper quadrant ultrasound examination.   Electronically Signed   By: Marijo Sanes M.D.   On: 03/19/2015 16:46    ASSESSMENT AND PLAN  1. Chest pain  - 2nd admission in the past week. No trop changes. Normal stress test in Jan  - has been under tremendous amount of stress given potential law suit  - Abd U/S negative. Per Dr. Radford Pax, if abd U/S negative, will need to consider cath to definitive assessment despite CKD. Will discuss MD regarding cath, prefer INR <1.7 (may need to delay until tomorrow as INR was 2.0 this morning)  - Risk and benefit of procedure explained to the patient who display clear understanding and agree to proceed. Discussed with patient possible procedural risk include bleeding, vascular injury, renal injury, arrythmia, MI, stroke and loss of  limb or life.  2. CKD stage III  3. PAF maintaining NSR on BB and warfarin  4. HTN 5. Mild AS 6. HLD 7. LE edema with RLE>LLE in size, unlikely to have DVT given therapeutic INR on arrival  - bilateral pitting edema, however lung clear on exam, ?venous stasis. Pending LE venous doppler  Signed, Almyra Deforest PA-C Pager: [phone]   Patient seen and examined. Agree with assessment and plan. Long discussion with patient and wife. Very concerned about increased stress.  Defer cath today with INR at 2.0; coumadin on hold, hydrate with renal insufficiency, Cr improved to 1.79 but still elevated today. Plan cath tomorrow; if PCI needed need to cancel elective epidural procedure for next Tuesday with Dr. Nelva Bush. If no need for PCI, will need to be off anticoagulation for procedure next week.   Troy Sine, MD, [facility] 03/20/2015 11:46 AM

## 2015-03-21 NOTE — Progress Notes (Signed)
Ate 1/2 [place] sandwich.

## 2015-03-21 NOTE — Care Management Note (Signed)
Case Management Note  Patient Details  Name: Mark Jackson. MRN: [id number] Date of Birth: 1930/03/02  Subjective/Objective:  Pt admitted for cp. Cardiac cath 03-21-15.                  Action/Plan: CM to monitor for disposition needs.    Expected Discharge Date:                  Expected Discharge Plan:  Home/Self Care  In-House Referral:     Discharge planning Services  CM Consult  Post Acute Care Choice:    Choice offered to:     DME Arranged:    DME Agency:     HH Arranged:    HH Agency:     Status of Service:  In process, will continue to follow  Medicare Important Message Given:    Date Medicare IM Given:    Medicare IM give by:    Date Additional Medicare IM Given:    Additional Medicare Important Message give by:     If discussed at [place] of Stay Meetings, dates discussed:    Additional Comments:  Bethena Roys, RN 03/21/2015, 10:14 AM

## 2015-03-21 NOTE — Progress Notes (Signed)
Patient c/o burning in chest. States his vision is blurred. HR SB [id number], BP 80 systolic. HOB down. Neuro assessment WNL. Skin w/d. Paging Dr. Irish Lack.

## 2015-03-21 NOTE — Progress Notes (Signed)
Received total of 250cc IVF. VSS. Transferred to 6500.

## 2015-03-21 NOTE — Interval H&P Note (Signed)
Cath Lab Visit (complete for each Cath Lab visit)  Clinical Evaluation Leading to the Procedure:   ACS: Yes.    Non-ACS:    Anginal Classification: CCS IV  Anti-ischemic medical therapy: Minimal Therapy (1 class of medications)  Non-Invasive Test Results: No non-invasive testing performed  Prior CABG: No previous CABG   TIMI SCORE  Patient Information:  TIMI Score is 2  UA/NSTEMI and low-risk features (e.g., TIMI score  Revascularization of the presumed culprit artery   U (6)  Indication: 9; Score: 6    History and Physical Interval Note:  03/21/2015 8:01 AM  Mark Jackson.  has presented today for surgery, with the diagnosis of cp  The various methods of treatment have been discussed with the patient and family. After consideration of risks, benefits and other options for treatment, the patient has consented to  Procedure(s): Left Heart Cath and Coronary Angiography (N/A) as a surgical intervention .  The patient's history has been reviewed, patient examined, no change in status, stable for surgery.  I have reviewed the patient's chart and labs.  Questions were answered to the patient's satisfaction.     Dyesha Henault S.

## 2015-03-21 NOTE — Progress Notes (Signed)
States vision is ok now. Denies chest burning. Placed on bedpan.

## 2015-03-21 NOTE — Progress Notes (Signed)
Two radial bands on rt wrist each w/13cc air. Hematoma resolved. Area bruised. Rthand slightly swollen/puffy. Sensation present. Rt arm elevated on 2 pillows.

## 2015-03-22 ENCOUNTER — Other Ambulatory Visit: Payer: Self-pay | Admitting: Physician Assistant

## 2015-03-22 ENCOUNTER — Ambulatory Visit: Payer: BLUE CROSS/BLUE SHIELD | Admitting: Physician Assistant

## 2015-03-22 DIAGNOSIS — I25119 Atherosclerotic heart disease of native coronary artery with unspecified angina pectoris: Secondary | ICD-10-CM | POA: Insufficient documentation

## 2015-03-22 DIAGNOSIS — I2510 Atherosclerotic heart disease of native coronary artery without angina pectoris: Secondary | ICD-10-CM

## 2015-03-22 DIAGNOSIS — Z9861 Coronary angioplasty status: Secondary | ICD-10-CM

## 2015-03-22 DIAGNOSIS — N189 Chronic kidney disease, unspecified: Secondary | ICD-10-CM

## 2015-03-22 DIAGNOSIS — I472 Ventricular tachycardia: Secondary | ICD-10-CM

## 2015-03-22 DIAGNOSIS — I25118 Atherosclerotic heart disease of native coronary artery with other forms of angina pectoris: Secondary | ICD-10-CM | POA: Insufficient documentation

## 2015-03-22 DIAGNOSIS — I200 Unstable angina: Secondary | ICD-10-CM

## 2015-03-22 LAB — CBC
HCT: 32.5 % — ABNORMAL LOW (ref 39.0–52.0)
HEMOGLOBIN: 10.7 g/dL — AB (ref 13.0–17.0)
MCH: 32.2 pg (ref 26.0–34.0)
MCHC: 32.9 g/dL (ref 30.0–36.0)
MCV: 97.9 fL (ref 78.0–100.0)
Platelets: 187 10*3/uL (ref 150–400)
RBC: 3.32 MIL/uL — ABNORMAL LOW (ref 4.22–5.81)
RDW: 13.3 % (ref 11.5–15.5)
WBC: 12.6 10*3/uL — AB (ref 4.0–10.5)

## 2015-03-22 LAB — BASIC METABOLIC PANEL
Anion gap: 7 (ref 5–15)
BUN: 26 mg/dL — ABNORMAL HIGH (ref 6–20)
CO2: 23 mmol/L (ref 22–32)
CREATININE: 1.81 mg/dL — AB (ref 0.61–1.24)
Calcium: 8.1 mg/dL — ABNORMAL LOW (ref 8.9–10.3)
Chloride: 111 mmol/L (ref 101–111)
GFR calc Af Amer: 38 mL/min — ABNORMAL LOW (ref >60)
GFR calc non Af Amer: 33 mL/min — ABNORMAL LOW (ref >60)
GLUCOSE: 105 mg/dL — AB (ref 65–99)
Potassium: 3.8 mmol/L (ref 3.5–5.1)
Sodium: 141 mmol/L (ref 135–145)

## 2015-03-22 LAB — PROTIME-INR
INR: 1.44 (ref 0.00–1.49)
PROTHROMBIN TIME: 17.6 s — AB (ref 11.6–15.2)

## 2015-03-22 MED ORDER — TICAGRELOR 90 MG PO TABS: 90.0000 mg | ORAL_TABLET | Freq: Two times a day (BID) | ORAL | Status: DC

## 2015-03-22 NOTE — Discharge Summary (Signed)
Physician Discharge Summary   Patient ID: Mark Jackson. MRN: [id number] DOB/AGE: 09-30-1929 81 y.o.  Admit date: 03/19/2015 Discharge date: 03/22/2015  Primary Care Physician:  Jenny Reichmann, MD  Discharge Diagnoses:    . Chest pain/unstable angina, proximal LAD stenosis  . Essential hypertension . Hyperlipidemia . Paroxysmal atrial fibrillation . Chronic combined systolic and diastolic heart failure   Consults:  Cardiology   Recommendations for Outpatient Follow-up:  Patient will need follow-up for BMET, to assess the renal function, arranged  Patient has Plavix allergy, per cardiology placed on Brilinta and Coumadin. Continue aspirin until Coumadin is therapeutic.  TESTS THAT NEED FOLLOW-UP BMET   DIET: Heart healthy    Allergies:   Allergies  Allergen Reactions  . Plavix [Clopidogrel Bisulfate]     Significant bruising     Discharge Medications:   Medication List    STOP taking these medications        hydrochlorothiazide 12.5 MG capsule  Commonly known as:  MICROZIDE      TAKE these medications        acetaminophen 325 MG tablet  Commonly known as:  TYLENOL  Take 325-650 mg by mouth every 6 (six) hours as needed for headache.     aspirin 81 MG tablet  Take 81 mg by mouth at bedtime.     baclofen 10 MG tablet  Commonly known as:  LIORESAL  Take 10 mg by mouth daily as needed (for headache).     CRESTOR 5 MG tablet  Generic drug:  rosuvastatin  TAKE 1 TABLET ONCE DAILY.     diltiazem 120 MG 24 hr capsule  Commonly known as:  CARDIZEM CD  Take 1 capsule (120 mg total) by mouth daily.     ferrous sulfate 325 (65 FE) MG tablet  Take 325 mg by mouth daily with breakfast.     metoprolol succinate 50 MG 24 hr tablet  Commonly known as:  TOPROL-XL  Take 1 tablet (50 mg total) by mouth at bedtime. Take with or immediately following a meal.     multivitamin with minerals tablet  Take 1 tablet by mouth daily.     ticagrelor 90 MG Tabs  tablet  Commonly known as:  BRILINTA  Take 1 tablet (90 mg total) by mouth 2 (two) times daily.     topiramate 100 MG tablet  Commonly known as:  TOPAMAX  Take 150 mg by mouth at bedtime.     VISINE OP  Apply 1 drop to eye 2 (two) times daily as needed (burning of eyes).     warfarin 5 MG tablet  Commonly known as:  COUMADIN  TAKE 1 TABLET ONCE DAILY AT 6PM OR AS DIRECTED.         Brief H and P: For complete details please refer to admission H and P, but in brief81 y.o. male with a history of chronic kidney disease stage III, mild aortic stenosis, moderate to severe right carotid stenosis, paroxysmal atrial fibrillation, hypertension, hyperlipidemia, and chronic combined systolic and diastolic heart failure who was admitted to the hospital 03/13/2015 with complaints of chest pain. He underwent a thorough cardiac evaluation at time and was seen by cardiology who recommended outpatient follow-up due to his chronic kidney disease and fear the cardiac cath would worsen his renal function. The patient was discharged home on 03/15/2015. On the morning of admission, when walking in his home he developed the acute recurrence of pain in the chest with associated dyspnea and diaphoresis.  He was also nauseous but did not vomit. The pain was described as a pressure type sensation in the central portion of his chest without radiation, and was similar to the pain he experienced leading to his prior hospital stay. In the emergency room cardiology was consulted.  Hospital Course:  Chest pain/ unstable angina Patient had been recently admitted to the hospital earlier this month with chest pain and had undergone cardiac evaluation at that time. Cardiology had recommended outpatient follow-up due to chronic kidney disease and patient was discharged home on 8/5. However patient returned back on the morning of admission with chest pain episode. Cardiology was consulted, recommended right upper quadrant  ultrasound which was negative for any cholecystitis.  Cardiac cath was done which showed proximal LAD lesion 80% stenosed, DES stent placed. Cardiology recommended of brilinta with coumadin, continue aspirin until coumadin is therapeutic   Mild aortic stenosis: No evidence to suggest acute complications  Acute on Chronic kidney disease stage III - follow Cr function closely, outpatient follow-up to check a BMET  Moderate to severe right carotid stenosis Being followed by cardiology in the outpatient setting - presently asymptomatic  Paroxysmal atrial fibrillation currently normal sinus rhythm, continue beta blocker  patient was on Coumadin prior to admission, patient will resume Coumadin at the time of discharge.  Hypertension Currently well-controlled  Hyperlipidemia Continue oral therapy  Chronic combined systolic and diastolic congestive heart failure EF 50-55% per echo 03/14/2015   Day of Discharge BP 110/53 mmHg  Pulse 70  Temp(Src) 99.3 F (37.4 C) (Oral)  Resp 18  Ht 5\' 8"  (1.727 m)  Wt 94.2 kg (207 lb 10.8 oz)  BMI 31.58 kg/m2  SpO2 96%  Physical Exam: General: Alert and awake oriented x3 not in any acute distress. HEENT: anicteric sclera, pupils reactive to light and accommodation CVS: S1-S2 clear no murmur rubs or gallops Chest: clear to auscultation bilaterally, no wheezing rales or rhonchi Abdomen: soft nontender, nondistended, normal bowel sounds Extremities: no cyanosis, clubbing or edema noted bilaterally Neuro: Cranial nerves II-XII intact, no focal neurological deficits   The results of significant diagnostics from this hospitalization (including imaging, microbiology, ancillary and laboratory) are listed below for reference.    LAB RESULTS: Basic Metabolic Panel:  Recent Labs Lab 03/20/15 0401 03/22/15 0325  NA 143 141  K 3.8 3.8  CL 114* 111  CO2 21* 23  GLUCOSE 91 105*  BUN 33* 26*  CREATININE 1.79* 1.81*  CALCIUM 8.4* 8.1*   Liver  Function Tests:  Recent Labs Lab 03/20/15 0401  AST 19  ALT 15*  ALKPHOS 44  BILITOT 0.7  PROT 5.5*  ALBUMIN 2.9*   No results for input(s): LIPASE, AMYLASE in the last 168 hours. No results for input(s): AMMONIA in the last 168 hours. CBC:  Recent Labs Lab 03/21/15 0418 03/22/15 0325  WBC 11.2* 12.6*  HGB 10.5* 10.7*  HCT 31.1* 32.5*  MCV 97.5 97.9  PLT 185 187   Cardiac Enzymes: No results for input(s): CKTOTAL, CKMB, CKMBINDEX, TROPONINI in the last 168 hours. BNP: Invalid input(s): POCBNP CBG: No results for input(s): GLUCAP in the last 168 hours.  Significant Diagnostic Studies:  Dg Chest Portable 1 View  03/19/2015   CLINICAL DATA:  Chest pain this morning, history essential hypertension, LEFT ventricular diastolic dysfunction, obesity, atrial fibrillation, prostate cancer, stage 3 chronic kidney disease  EXAM: PORTABLE CHEST - 1 VIEW  COMPARISON:  Portable exam 1056 hours compared 03/13/2015  FINDINGS: Upper normal heart size, mediastinal contours  and pulmonary vascularity.  Atherosclerotic calcification aorta.  Lungs clear.  No pleural effusion or pneumothorax.  Bones demineralized.  IMPRESSION: No acute abnormalities.   Electronically Signed   By: Lavonia Dana M.D.   On: 03/19/2015 11:21   US Abdomen Limited Ruq  03/19/2015   CLINICAL DATA:  Acute onset of chest and epigastric pain today.  EXAM: US ABDOMEN LIMITED - RIGHT UPPER QUADRANT  COMPARISON:  CT scan 05/23/2009  FINDINGS: Gallbladder:  No gallstones or wall thickening visualized. No sonographic Murphy sign noted.  Common bile duct:  Diameter: 5.8 mm  Liver:  Normal echogenicity without focal lesion or biliary dilatation.  IMPRESSION: Unremarkable right upper quadrant ultrasound examination.   Electronically Signed   By: Marijo Sanes M.D.   On: 03/19/2015 16:46    2D ECHO:   Disposition and Follow-up:     Discharge Instructions    ([place]) Call MD:  Anytime you have any of the following  symptoms: 1) 3 pound weight gain in 24 hours or 5 pounds in 1 week 2) shortness of breath, with or without a dry hacking cough 3) swelling in the hands, feet or stomach 4) if you have to sleep on extra pillows at night in order to breathe.    Complete by:  As directed      Amb Referral to Cardiac Rehabilitation    Complete by:  As directed   Congestive Heart Failure: If diagnosis is Heart Failure, patient MUST meet each of the CMS criteria: 1. Left Ventricular Ejection Fraction </= 35% 2. NYHA class II-IV symptoms despite being on optimal heart failure therapy for at least 6 weeks. 3. Stable = have not had a recent (<6 weeks) or planned (<6 months) major cardiovascular hospitalization or procedure  Program Details: - Physician supervised classes - 1-3 classes per week over a 12-18 week period, generally for a total of 36 sessions  Physician Certification: I certify that the above Cardiac Rehabilitation treatment is medically necessary and is medically approved by me for treatment of this patient. The patient is willing and cooperative, able to ambulate and medically stable to participate in exercise rehabilitation. The participant's progress and Individualized Treatment Plan will be reviewed by the Medical Director, Cardiac Rehab staff and as indicated by the Referring/Ordering Physician.  Diagnosis:  PCI     Diet - low sodium heart healthy    Complete by:  As directed      Increase activity slowly    Complete by:  As directed             DISPOSITION: Home   DISCHARGE FOLLOW-UP Follow-up Information    Follow up with [name], STEVE A, MD. Schedule an appointment as soon as possible for a visit in 2 weeks.   Specialty:  Family Medicine   Why:  for hospital follow-up   Contact information:   [place] [id number] [phone]       Follow up with [name], MD On 04/23/2015.   Specialty:  Cardiology   Why:  9:00am   Contact information:   [address] [place] [address] [phone]       Follow up with [facility] On 03/25/2015.   Why:  Obtain BMET to followup on renal function after cath next Monday or Tuesday during normal lab hour between 8:30am to 4:30pm   Contact information:   [place] [id number] [phone]      Follow  up with [facility] On 04/03/2015.   Specialty:  Cardiology   Why:  8:30am   Contact information:   [address] [place] [phone]       Time spent on Discharge: 30 minutes  Signed:   Maysen Bonsignore M.D. [facility] 03/22/2015, 1:21 PM Pager: [phone]

## 2015-03-22 NOTE — Care Management Important Message (Signed)
Important Message  Patient Details  Name: Mark Jackson. MRN: [id number] Date of Birth: 04-08-1930   Medicare Important Message Given:  Yes-second notification given    Delorse Lek 03/22/2015, 3:11 PM

## 2015-03-22 NOTE — Progress Notes (Signed)
CARDIAC REHAB PHASE I   PRE:  Rate/Rhythm: 68 SR  BP:  Supine:   Sitting: 110/53  Standing:    SaO2:   MODE:  Ambulation: 1000 ft   POST:  Rate/Rhythm: 81 SR  BP:  Supine:   Sitting: 126/49  Standing:    SaO2:  0820-0922 Pt walked 1000 ft with handheld asst with steady gait. Tolerated well. No CP. Education completed emphasizing brilinta for stent. Reviewed NTG use, diet and ex. Discussed CRP 2 and pt agreed for referral  to [place] as he is considering. Case manager to see pt to give brilinta booklet.   Graylon Good, RN BSN  03/22/2015 9:20 AM

## 2015-03-22 NOTE — Progress Notes (Signed)
Patient Name: Mark Jackson. Date of Encounter: 03/22/2015  Primary Cardiologist Dr. Richardo Priest. Gwenlyn Found   Principal Problem:   Chest pain Active Problems:   Essential hypertension   Hyperlipidemia   Paroxysmal atrial fibrillation   Chronic combined systolic and diastolic heart failure   Aortic stenosis   Angina pectoris, unstable   B12 deficiency    SUBJECTIVE  Denies any CP or SOB.   CURRENT MEDS . aspirin  81 mg Oral Daily  . diltiazem  120 mg Oral Daily  . metoprolol succinate  25 mg Oral QHS  . multivitamin with minerals  1 tablet Oral Daily  . rosuvastatin  5 mg Oral Daily  . sodium chloride  3 mL Intravenous Q12H  . ticagrelor  90 mg Oral BID  . topiramate  150 mg Oral QHS    OBJECTIVE  Filed Vitals:   03/21/15 1838 03/21/15 2010 03/22/15 0008 03/22/15 0414  BP: 151/60 145/76 163/70 142/71  Pulse:  96 90 75  Temp:  97.7 F (36.5 C) 98.4 F (36.9 C) 99.7 F (37.6 C)  TempSrc:  Oral Oral Oral  Resp:  11 19 19   Height:      Weight:   207 lb 10.8 oz (94.2 kg)   SpO2:  100% 99% 97%    Intake/Output Summary (Last 24 hours) at 03/22/15 0732 Last data filed at 03/22/15 0503  Gross per 24 hour  Intake 975.46 ml  Output    550 ml  Net 425.46 ml   Filed Weights   03/19/15 1025 03/19/15 2020 03/22/15 0008  Weight: 199 lb (90.266 kg) 199 lb 12.8 oz (90.629 kg) 207 lb 10.8 oz (94.2 kg)    PHYSICAL EXAM  General: Pleasant, NAD. Neuro: Alert and oriented X 3. Moves all extremities spontaneously. Psych: Normal affect. HEENT:  Normal  Neck: Supple without bruits or JVD. Lungs:  Resp regular and unlabored, CTA. Heart: RRR no s3, s4, or murmurs. R radial cath site stable.  Abdomen: Soft, non-tender, non-distended, BS + x 4.  Extremities: No clubbing, cyanosis or edema. DP/PT/Radials 2+ and equal bilaterally.  Accessory Clinical Findings  CBC  Recent Labs  03/21/15 0418 03/22/15 0325  WBC 11.2* 12.6*  HGB 10.5* 10.7*  HCT 31.1* 32.5*  MCV  97.5 97.9  PLT 185 [id number]   Basic Metabolic Panel  Recent Labs  03/20/15 0401 03/22/15 0325  NA 143 141  K 3.8 3.8  CL 114* 111  CO2 21* 23  GLUCOSE 91 105*  BUN 33* 26*  CREATININE 1.79* 1.81*  CALCIUM 8.4* 8.1*   Liver Function Tests  Recent Labs  03/20/15 0401  AST 19  ALT 15*  ALKPHOS 44  BILITOT 0.7  PROT 5.5*  ALBUMIN 2.9*   TELE NSR with 1 episode of 5 beats run of NSVT    ECG  NSR with TWI in V4-V6  Echocardiogram 03/14/2015  - Left ventricle: The cavity size was normal. Wall thickness was increased in a pattern of mild LVH. Systolic function was normal. The estimated ejection fraction was in the range of 50% to 55%. Although no diagnostic regional wall motion abnormality was identified, this possibility cannot be completely excluded on the basis of this study. Left ventricular diastolic function parameters were normal. - Aortic valve: There was mild stenosis. There was trivial regurgitation. Valve area (VTI): 1.74 cm^2. Valve area (Vmax): 1.6 cm^2. Valve area (Vmean): 1.46 cm^2. - Left atrium: The atrium was mildly dilated.    Radiology/Studies  Dg Chest 2  View  03/13/2015   CLINICAL DATA:  Acute onset chest pain earlier today. Atrial fibrillation.  EXAM: CHEST  2 VIEW  COMPARISON:  08/20/2014  FINDINGS: The heart size and mediastinal contours are within normal limits. Both lungs are clear. No evidence of pleural effusion. Mild thoracic spine degenerative disc disease noted.  IMPRESSION: No active cardiopulmonary disease.   Electronically Signed   By: Earle Gell M.D.   On: 03/13/2015 16:31   Dg Chest Portable 1 View  03/19/2015   CLINICAL DATA:  Chest pain this morning, history essential hypertension, LEFT ventricular diastolic dysfunction, obesity, atrial fibrillation, prostate cancer, stage 3 chronic kidney disease  EXAM: PORTABLE CHEST - 1 VIEW  COMPARISON:  Portable exam 1056 hours compared 03/13/2015  FINDINGS: Upper normal heart  size, mediastinal contours and pulmonary vascularity.  Atherosclerotic calcification aorta.  Lungs clear.  No pleural effusion or pneumothorax.  Bones demineralized.  IMPRESSION: No acute abnormalities.   Electronically Signed   By: Lavonia Dana M.D.   On: 03/19/2015 11:21   US Abdomen Limited Ruq  03/19/2015   CLINICAL DATA:  Acute onset of chest and epigastric pain today.  EXAM: US ABDOMEN LIMITED - RIGHT UPPER QUADRANT  COMPARISON:  CT scan 05/23/2009  FINDINGS: Gallbladder:  No gallstones or wall thickening visualized. No sonographic Murphy sign noted.  Common bile duct:  Diameter: 5.8 mm  Liver:  Normal echogenicity without focal lesion or biliary dilatation.  IMPRESSION: Unremarkable right upper quadrant ultrasound examination.   Electronically Signed   By: Marijo Sanes M.D.   On: 03/19/2015 16:46    ASSESSMENT AND PLAN  1. Chest pain - 2nd admission in the past week. No trop changes. Normal stress test in Jan - has been under tremendous amount of stress given potential law suit - Abd U/S negative.   - cath 03/21/2015 80% prox LAD treated with 2.75x18mm Synergy post dilated to 3.3, mildly elevated LVEDP, mild aortic valve gradient  - per cath report, pt allergic to pavix, will continue 3 month brilinta with his coumadin, continue ASA until INR therapeutic. Will defer to outpatient primary cardiologist on when to stop ASA.    - stable for discharge today, will obtain outpatient BMET next Monday to monitor for contrast nephropathy given his significant CKD. Can restart home HCTZ tomorrow for elevated BP and allow kidney to rest today after contrast  - will need to hold off the Epidural procedure next week, as he can not come off Brillinta at this time  Note: i printed him 2 Rx, one is 30 day supply with no refill to use with free coupon, another one is 30 days with 11 refill  2. CKD stage III  3. PAF maintaining NSR on BB and warfarin  4. HTN 5. Mild  AS 6. HLD 7. LE edema with RLE>LLE in size, unlikely to have DVT given therapeutic INR on arrival - bilateral pitting edema, however lung clear on exam, ?venous stasis. Low suspicion for DVT  Signed, Woodward Ku Pager: [phone]    Patient seen and examined. Agree with assessment and plan. No recurrent chest pain. Bleeding risk increased with brilinta/coumadin, but plavix allergy. Cr 1.81 today.Had short 5 beat run of NSVT this am.  May need increased BB; will increase Toprol to 37.5 mg.  For dc today. Need to f/u Bmet to re-asses renal fxn early next week. Cardiac Rehab.    Troy Sine, MD, [facility] 03/22/2015 11:20 AM

## 2015-03-22 NOTE — Progress Notes (Signed)
CM spoke with pt regarding Brilinta and provided pt with booklet  With 30 day free card enclosed. CM explained card usage  and pt verbally stated understanding of card usage. [facility] called and confirmed medication is in stock, pt made aware. No other needs identified per CM. Whitman Hero RN,BSN,CM [phone]

## 2015-03-26 ENCOUNTER — Ambulatory Visit: Payer: BLUE CROSS/BLUE SHIELD

## 2015-03-26 ENCOUNTER — Telehealth: Payer: Self-pay | Admitting: Cardiology

## 2015-03-26 ENCOUNTER — Telehealth: Payer: Self-pay

## 2015-03-26 DIAGNOSIS — I25700 Atherosclerosis of coronary artery bypass graft(s), unspecified, with unstable angina pectoris: Secondary | ICD-10-CM | POA: Diagnosis not present

## 2015-03-26 LAB — BASIC METABOLIC PANEL
BUN: 41 mg/dL — ABNORMAL HIGH (ref 7–25)
CO2: 23 mmol/L (ref 20–31)
Calcium: 8.6 mg/dL (ref 8.6–10.3)
Chloride: 109 mmol/L (ref 98–110)
Creat: 1.89 mg/dL — ABNORMAL HIGH (ref 0.70–1.11)
Glucose, Bld: 87 mg/dL (ref 65–99)
POTASSIUM: 4.5 mmol/L (ref 3.5–5.3)
SODIUM: 140 mmol/L (ref 135–146)

## 2015-03-26 NOTE — Telephone Encounter (Signed)
At lab needs order for BMET to be done one week after stent placement for kidney function

## 2015-03-26 NOTE — Telephone Encounter (Signed)
Lab order sent to [facility] by Loyal Jacobson RN

## 2015-03-26 NOTE — Telephone Encounter (Signed)
Katrina from [facility] is needing a lab order , Mr. Rummell is at the lab now .Marland Kitchen

## 2015-03-28 ENCOUNTER — Ambulatory Visit: Payer: Medicare Other | Admitting: *Deleted

## 2015-03-28 ENCOUNTER — Other Ambulatory Visit (INDEPENDENT_AMBULATORY_CARE_PROVIDER_SITE_OTHER): Payer: Medicare Other | Admitting: *Deleted

## 2015-03-28 DIAGNOSIS — E538 Deficiency of other specified B group vitamins: Secondary | ICD-10-CM

## 2015-03-28 DIAGNOSIS — E539 Vitamin B deficiency, unspecified: Secondary | ICD-10-CM

## 2015-03-28 NOTE — Progress Notes (Unsigned)
Patient here for vitamin B 12 injection 1 ml only.

## 2015-03-28 NOTE — Progress Notes (Signed)
   Subjective:    Patient ID: Mark Jackson., male    DOB: Dec 05, 1929, 81 y.o.   MRN: [id number]  HPI    Review of Systems     Objective:   Physical Exam        Assessment & Plan:  Patient here for Vitamin B 12 injection only.

## 2015-03-31 ENCOUNTER — Other Ambulatory Visit: Payer: Self-pay | Admitting: Cardiovascular Disease

## 2015-04-01 NOTE — Telephone Encounter (Signed)
Rx(s) sent to pharmacy electronically.  

## 2015-04-03 ENCOUNTER — Ambulatory Visit (INDEPENDENT_AMBULATORY_CARE_PROVIDER_SITE_OTHER): Payer: Medicare Other | Admitting: Pharmacist Clinician (PhC)/ Clinical Pharmacy Specialist

## 2015-04-03 DIAGNOSIS — Z7901 Long term (current) use of anticoagulants: Secondary | ICD-10-CM | POA: Diagnosis not present

## 2015-04-03 DIAGNOSIS — I4891 Unspecified atrial fibrillation: Secondary | ICD-10-CM | POA: Diagnosis not present

## 2015-04-03 LAB — POCT INR: INR: 2.2

## 2015-04-04 ENCOUNTER — Ambulatory Visit (INDEPENDENT_AMBULATORY_CARE_PROVIDER_SITE_OTHER): Payer: Medicare Other | Admitting: Emergency Medicine

## 2015-04-04 ENCOUNTER — Telehealth: Payer: Self-pay | Admitting: *Deleted

## 2015-04-04 ENCOUNTER — Encounter: Payer: Self-pay | Admitting: Emergency Medicine

## 2015-04-04 VITALS — BP 124/70 | HR 52 | Temp 97.4°F | Resp 16 | Ht 68.0 in | Wt 196.4 lb

## 2015-04-04 DIAGNOSIS — N183 Chronic kidney disease, stage 3 (moderate): Secondary | ICD-10-CM | POA: Diagnosis not present

## 2015-04-04 DIAGNOSIS — I25700 Atherosclerosis of coronary artery bypass graft(s), unspecified, with unstable angina pectoris: Secondary | ICD-10-CM

## 2015-04-04 DIAGNOSIS — N182 Chronic kidney disease, stage 2 (mild): Secondary | ICD-10-CM | POA: Diagnosis not present

## 2015-04-04 DIAGNOSIS — D6489 Other specified anemias: Secondary | ICD-10-CM | POA: Diagnosis not present

## 2015-04-04 DIAGNOSIS — N181 Chronic kidney disease, stage 1: Secondary | ICD-10-CM | POA: Diagnosis not present

## 2015-04-04 DIAGNOSIS — I4891 Unspecified atrial fibrillation: Secondary | ICD-10-CM | POA: Diagnosis not present

## 2015-04-04 DIAGNOSIS — I129 Hypertensive chronic kidney disease with stage 1 through stage 4 chronic kidney disease, or unspecified chronic kidney disease: Secondary | ICD-10-CM | POA: Diagnosis not present

## 2015-04-04 DIAGNOSIS — N184 Chronic kidney disease, stage 4 (severe): Secondary | ICD-10-CM | POA: Diagnosis not present

## 2015-04-04 DIAGNOSIS — N185 Chronic kidney disease, stage 5: Secondary | ICD-10-CM | POA: Diagnosis not present

## 2015-04-04 DIAGNOSIS — N189 Chronic kidney disease, unspecified: Secondary | ICD-10-CM | POA: Diagnosis not present

## 2015-04-04 LAB — CBC WITH DIFFERENTIAL/PLATELET
BASOS PCT: 0 % (ref 0–1)
Basophils Absolute: 0 10*3/uL (ref 0.0–0.1)
EOS ABS: 0.5 10*3/uL (ref 0.0–0.7)
EOS PCT: 5 % (ref 0–5)
HCT: 32.5 % — ABNORMAL LOW (ref 39.0–52.0)
Hemoglobin: 10.8 g/dL — ABNORMAL LOW (ref 13.0–17.0)
LYMPHS ABS: 1.2 10*3/uL (ref 0.7–4.0)
Lymphocytes Relative: 13 % (ref 12–46)
MCH: 31.6 pg (ref 26.0–34.0)
MCHC: 33.2 g/dL (ref 30.0–36.0)
MCV: 95 fL (ref 78.0–100.0)
MONO ABS: 0.7 10*3/uL (ref 0.1–1.0)
MONOS PCT: 8 % (ref 3–12)
MPV: 9.1 fL (ref 8.6–12.4)
Neutro Abs: 6.7 10*3/uL (ref 1.7–7.7)
Neutrophils Relative %: 74 % (ref 43–77)
PLATELETS: 303 10*3/uL (ref 150–400)
RBC: 3.42 MIL/uL — ABNORMAL LOW (ref 4.22–5.81)
RDW: 13.8 % (ref 11.5–15.5)
WBC: 9.1 10*3/uL (ref 4.0–10.5)

## 2015-04-04 LAB — BASIC METABOLIC PANEL WITH GFR
BUN: 45 mg/dL — AB (ref 7–25)
CALCIUM: 8.5 mg/dL — AB (ref 8.6–10.3)
CHLORIDE: 109 mmol/L (ref 98–110)
CO2: 22 mmol/L (ref 20–31)
CREATININE: 2.32 mg/dL — AB (ref 0.70–1.11)
GFR, Est African American: 29 mL/min — ABNORMAL LOW (ref >=60)
GFR, Est Non African American: 25 mL/min — ABNORMAL LOW (ref >=60)
Glucose, Bld: 93 mg/dL (ref 65–99)
Potassium: 4.4 mmol/L (ref 3.5–5.3)
Sodium: 141 mmol/L (ref 135–146)

## 2015-04-04 NOTE — Telephone Encounter (Signed)
Faxed -signed cardiac rehab phase ll orders
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
## 2015-04-08 ENCOUNTER — Telehealth: Payer: Self-pay | Admitting: Internal Medicine

## 2015-04-08 ENCOUNTER — Telehealth: Payer: Self-pay | Admitting: Cardiology

## 2015-04-08 ENCOUNTER — Ambulatory Visit (INDEPENDENT_AMBULATORY_CARE_PROVIDER_SITE_OTHER): Payer: Medicare Other | Admitting: Family Medicine

## 2015-04-08 ENCOUNTER — Telehealth: Payer: Self-pay | Admitting: Emergency Medicine

## 2015-04-08 ENCOUNTER — Telehealth: Payer: Self-pay

## 2015-04-08 ENCOUNTER — Encounter: Payer: Self-pay | Admitting: Family Medicine

## 2015-04-08 VITALS — BP 132/66 | HR 69 | Temp 98.6°F | Resp 18 | Ht 68.0 in | Wt 201.1 lb

## 2015-04-08 DIAGNOSIS — K625 Hemorrhage of anus and rectum: Secondary | ICD-10-CM | POA: Diagnosis not present

## 2015-04-08 DIAGNOSIS — K921 Melena: Secondary | ICD-10-CM

## 2015-04-08 DIAGNOSIS — I25700 Atherosclerosis of coronary artery bypass graft(s), unspecified, with unstable angina pectoris: Secondary | ICD-10-CM | POA: Diagnosis not present

## 2015-04-08 LAB — POCT CBC
Granulocyte percent: 66.5 %G (ref 37–80)
HCT, POC: 32.6 % — AB (ref 43.5–53.7)
Hemoglobin: 10.2 g/dL — AB (ref 14.1–18.1)
Lymph, poc: 2.3 (ref 0.6–3.4)
MCH, POC: 29.8 pg (ref 27–31.2)
MCHC: 31.4 g/dL — AB (ref 31.8–35.4)
MCV: 95.2 fL (ref 80–97)
MID (cbc): 0.9 (ref 0–0.9)
MPV: 7.2 fL (ref 0–99.8)
POC Granulocyte: 6.5 (ref 2–6.9)
POC LYMPH PERCENT: 24.1 %L (ref 10–50)
POC MID %: 9.4 %M (ref 0–12)
Platelet Count, POC: 283 10*3/uL (ref 142–424)
RBC: 3.42 M/uL — AB (ref 4.69–6.13)
RDW, POC: 13.7 %
WBC: 9.7 10*3/uL (ref 4.6–10.2)

## 2015-04-08 NOTE — Telephone Encounter (Signed)
On Call cardiology  Received a call from Robyn Haber, [place] at [facility]gent [facility]. Patient has hx of diverticulosis and reports rectal bleeding. He has history of Af and is on coumadin. He also has CAD had DES to prox LAD (SYNERGY DES 2.75X16) on 03/21/15 and was on ASA and Brilinta. Earlier today he was instructed to stop his ASA but now he presented to urgent care. His Hb appears to be stable. We recommend:   - Ideally recommend dual antiplatelet therapy for at least 3 months for stable ischemic heart disease if able to tolerate it given risk of stent thrombosis.  - My hold coumadin in the short term until bleeding is stopped and resume when possible.  - If hemodynamically significant bleeding then hold all agents   Wandra Mannan, MD On call

## 2015-04-08 NOTE — Patient Instructions (Signed)
Please return tomorrow after 4 pm unless heavy bleeding comes about, in which case go to the emergency department at the hospital  Stop the coumadin.  Continue the Brilint.  Take one multivitamin tonight.

## 2015-04-08 NOTE — Telephone Encounter (Signed)
Returned call to patient he stated he has been having blood in stools for the past 2 months.Stated worse the last 2 weeks.Stated he is having a lot of blood when he has a bm.Stated he is color blind so he cannot tell if bright red blood.Stated he does know it is a lot of blood.He is taking 3 blood thinners.He has a call into his PCP,but wanted to get Dr.Harding's advice.His last INR check was normal.Message sent to Bureau for advice.

## 2015-04-08 NOTE — Telephone Encounter (Signed)
I left a message on the patient's home phone that he should go to the hospital and have his CBC and PT/INR checked.

## 2015-04-08 NOTE — Telephone Encounter (Signed)
Lets check a CBC & Iron Panel.   For now stop ASA. Make sure that he is taking a stool softener. Will know more after we see CBC.  Continue Brilinta & other blood thinner - just stop ASA.  [name], MD

## 2015-04-08 NOTE — Telephone Encounter (Signed)
I called and spoke directly with the patient. He has had 2 loose bloody stools unsure of the color. I advised him to be seen in our clinic for PT/INR and CBC and evaluation.

## 2015-04-08 NOTE — Telephone Encounter (Signed)
Pt called in stating that within the past week, the pt has noticed that he has been passing a significant amount of blood in his stools and would like to be advised on what to do. Please f/u with pt  Thanks

## 2015-04-08 NOTE — Progress Notes (Signed)
Subjective:    Patient ID: Mark Jackson., male    DOB: [birth year], 81 y.o.   MRN: [id number] This chart was scribed for Robyn Haber, MD by Zola Button, Medical Scribe. This patient was seen in Room 12 and the patient's care was started at 7:34 PM.   HPI HPI Comments: Mark Jackson. is a 81 y.o. male with a history of anemia, right-sided carotid artery disease, A Fib, chronic combined systolic and diastolic heart failure, HLD, and HTN who presents [facility] the [facility] complaining of rectal bleeding that started a few days ago. Patient has noticed blood when wiping. His last pro-time was 2.2. His last colonoscopy was done by Dr. Sharlett Iles and was told he did not have to return. He has had mild, occasional rectal bleeding with the coumadin, but notes he has had more bleeding since he started Brilinta recently after he had a recent cardiac catheterization/stents on 03/21/15. His last episode of atrial fibrillation was 7 months ago.  Cardiologist: Dr. Ellyn Hack  Past Surgical History  Procedure Laterality Date   Knee arthroscopy Bilateral    Tonsillectomy and adenoidectomy     Nm myoview ltd  February 2012; 09/05/2014    a) 2012: Persantine Myoview: Inferior attenuation artifact but no ischemia or infarction. Normal EF.;; b) 08/2014: 60%. Fixed inferior defect likely diaphragmatic attenuation. LOW RISK.    Carotid doppler  10/21/2012    Right bulb 50-69% diameter reduction; Right proximal ICA 70-99% diameter reduction; left ICA 0-49% diameter reduction.   Transthoracic echocardiogram  10/21/2012; 08/21/2014    EF 55-60%. Moderate concentric hypertrophy, grade 1 diastolic dysfunction; b) [id number]: EF 45-50%, Mild concentric LVH, mild AS (p-m gradient 21-16 mmHg),    Tee without cardioversion N/A 08/22/2014    Procedure: TRANSESOPHAGEAL ECHOCARDIOGRAM (TEE);  Surgeon: Josue Hector, MD;  Location: [place];  Service: Cardiovascular;  Laterality: N/A;   Cardioversion  N/A 08/22/2014    Procedure: CARDIOVERSION;  Surgeon: Josue Hector, MD;  Location: [facility] ENDOSCOPY;  Service: Cardiovascular;  Laterality: N/A;   Appendectomy     Joint replacement     Total knee arthroplasty Right 05/2009   Cataract extraction w/ intraocular lens  implant, bilateral Bilateral    Insertion prostate radiation seed  04/2007   Cardiac catheterization N/A 03/21/2015    Procedure: Left Heart Cath and Coronary Angiography;  Surgeon: Jettie Booze, MD;  Location: [place] CV LAB;  Service: Cardiovascular;  Laterality: N/A;   Cardiac catheterization  03/21/2015    Procedure: Coronary Stent Intervention;  Surgeon: Jettie Booze, MD;  Location: [place] CV LAB;  Service: Cardiovascular;;     Review of Systems  Gastrointestinal: Positive for anal bleeding.       Objective:   Physical Exam CONSTITUTIONAL: Well developed/well nourished HEAD: Normocephalic/atraumatic EYES: EOM/PERRL ENMT: Mucous membranes moist NECK: supple no meningeal signs SPINE: entire spine nontender CV: S1/S2 noted, no murmurs/rubs/gallops noted LUNGS: Lungs are clear to auscultation bilaterally, no apparent distress ABDOMEN: soft, nontender, no rebound or guarding GU: no cva tenderness NEURO: Pt is awake/alert, moves all extremitiesx4 EXTREMITIES: pulses normal, full ROM SKIN: warm, color normal PSYCH: no abnormalities of mood noted  Results for orders placed or performed in visit on 04/08/15  POCT CBC  Result Value Ref Range   WBC 9.7 4.6 - 10.2 K/uL   Lymph, poc 2.3 0.6 - 3.4   POC LYMPH PERCENT 24.1 10 - 50 %L   MID (cbc) 0.9 0 - 0.9  POC MID % 9.4 0 - 12 %M   POC Granulocyte 6.5 2 - 6.9   Granulocyte percent 66.5 37 - 80 %G   RBC 3.42 (A) 4.69 - 6.13 M/uL   Hemoglobin 10.2 (A) 14.1 - 18.1 g/dL   HCT, POC 32.6 (A) 43.5 - 53.7 %   MCV 95.2 80 - 97 fL   MCH, POC 29.8 27 - 31.2 pg   MCHC 31.4 (A) 31.8 - 35.4 g/dL   RDW, POC 13.7 %   Platelet Count, POC 283 142 - 424  K/uL   MPV 7.2 0 - 99.8 fL         Assessment & Plan:    This chart was scribed in my presence and reviewed by me personally.    ICD-9-CM ICD-10-CM   1. Rectal bleeding 569.3 K62.5 POCT CBC     Signed, Robyn Haber, MD

## 2015-04-08 NOTE — Telephone Encounter (Signed)
Spoke with pt, I advised him to come in to be seen. He refused and wanted Dr. Everlene Farrier to know what Dr. Ellyn Hack suggested.  Dr. Ellyn Hack  Go off ASA Go on stool softener Need bloodwork  FYI

## 2015-04-08 NOTE — Telephone Encounter (Signed)
SPOKE TO PATIENT VERBALIZED UNDERSTANDING. WILL GO TO LAB TODAY.

## 2015-04-08 NOTE — Telephone Encounter (Signed)
I called and spoke with patient. He is having loose stools. I advised him to come to the office so he could have a CBC and examination as well as a PT/INR.

## 2015-04-08 NOTE — Telephone Encounter (Signed)
Pt states he is on blood thinners and is now experiencing blood in his stool, Please advise    Best phone for pt is [phone]

## 2015-04-09 ENCOUNTER — Encounter: Payer: Self-pay | Admitting: Family Medicine

## 2015-04-09 ENCOUNTER — Ambulatory Visit (INDEPENDENT_AMBULATORY_CARE_PROVIDER_SITE_OTHER): Payer: Medicare Other | Admitting: Family Medicine

## 2015-04-09 ENCOUNTER — Telehealth: Payer: Self-pay | Admitting: *Deleted

## 2015-04-09 ENCOUNTER — Ambulatory Visit: Payer: BLUE CROSS/BLUE SHIELD

## 2015-04-09 ENCOUNTER — Other Ambulatory Visit: Payer: Self-pay | Admitting: Family Medicine

## 2015-04-09 VITALS — BP 128/70 | HR 72 | Temp 97.9°F | Resp 16 | Ht 68.0 in | Wt 200.0 lb

## 2015-04-09 DIAGNOSIS — K625 Hemorrhage of anus and rectum: Secondary | ICD-10-CM | POA: Diagnosis not present

## 2015-04-09 DIAGNOSIS — N289 Disorder of kidney and ureter, unspecified: Secondary | ICD-10-CM

## 2015-04-09 DIAGNOSIS — K5731 Diverticulosis of large intestine without perforation or abscess with bleeding: Secondary | ICD-10-CM

## 2015-04-09 DIAGNOSIS — I25700 Atherosclerosis of coronary artery bypass graft(s), unspecified, with unstable angina pectoris: Secondary | ICD-10-CM

## 2015-04-09 LAB — IRON AND TIBC
%SAT: 42 % (ref 15–60)
IRON: 92 ug/dL (ref 50–180)
TIBC: 220 ug/dL — ABNORMAL LOW (ref 250–425)
UIBC: 128 ug/dL (ref 125–400)

## 2015-04-09 LAB — CBC
HCT: 33.1 % — ABNORMAL LOW (ref 39.0–52.0)
HEMOGLOBIN: 10.8 g/dL — AB (ref 13.0–17.0)
MCH: 31.5 pg (ref 26.0–34.0)
MCHC: 32.6 g/dL (ref 30.0–36.0)
MCV: 96.5 fL (ref 78.0–100.0)
MPV: 9.5 fL (ref 8.6–12.4)
Platelets: 307 10*3/uL (ref 150–400)
RBC: 3.43 MIL/uL — ABNORMAL LOW (ref 4.22–5.81)
RDW: 14.1 % (ref 11.5–15.5)
WBC: 9.6 10*3/uL (ref 4.0–10.5)

## 2015-04-09 LAB — POCT CBC
Granulocyte percent: 71.6 %G (ref 37–80)
HCT, POC: 34.1 % — AB (ref 43.5–53.7)
Hemoglobin: 11.3 g/dL — AB (ref 14.1–18.1)
Lymph, poc: 2.1 (ref 0.6–3.4)
MCH, POC: 31.6 pg — AB (ref 27–31.2)
MCHC: 33.1 g/dL (ref 31.8–35.4)
MCV: 95.6 fL (ref 80–97)
MID (cbc): 0.7 (ref 0–0.9)
MPV: 7.6 fL (ref 0–99.8)
POC Granulocyte: 7.1 — AB (ref 2–6.9)
POC LYMPH PERCENT: 21.4 %L (ref 10–50)
POC MID %: 7 %M (ref 0–12)
Platelet Count, POC: 287 10*3/uL (ref 142–424)
RBC: 3.56 M/uL — AB (ref 4.69–6.13)
RDW, POC: 14.4 %
WBC: 9.9 10*3/uL (ref 4.6–10.2)

## 2015-04-09 LAB — FERRITIN: Ferritin: 188 ng/mL (ref 22–322)

## 2015-04-09 NOTE — Telephone Encounter (Signed)
Spoke to patient  Results given. Verbalized understanding. Restart taking aspirin. Continue clopidogrel 75 mg Noted hold of warafrin on medication list.
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
## 2015-04-09 NOTE — Telephone Encounter (Signed)
-----   Message from [name], MD sent at 04/09/2015 10:06 AM EDT ----- Hemoglobin remained stable. I think at this time, that's simply hold the warfarin for about a week, to make sure that the bleeding is stopped. After that we can we start Coumadin. I agree that it would be best if possible to continue aspirin plus Plavix for 3 months.    [place]

## 2015-04-09 NOTE — Telephone Encounter (Signed)
If there is significant bleed - I agree with holding warfarin over ASA.  [name], MD

## 2015-04-09 NOTE — Patient Instructions (Addendum)
Please return after 2:00 Tuesday September 6 for a recheck. Stay off the Coumadin and aspirin until then. Continue the Brilinta.

## 2015-04-10 DIAGNOSIS — H35371 Puckering of macula, right eye: Secondary | ICD-10-CM | POA: Diagnosis not present

## 2015-04-11 ENCOUNTER — Other Ambulatory Visit: Payer: Self-pay | Admitting: Emergency Medicine

## 2015-04-12 ENCOUNTER — Other Ambulatory Visit: Payer: Self-pay | Admitting: Emergency Medicine

## 2015-04-16 ENCOUNTER — Other Ambulatory Visit: Payer: Self-pay | Admitting: Cardiovascular Disease

## 2015-04-16 ENCOUNTER — Ambulatory Visit: Payer: Medicare Other

## 2015-04-18 ENCOUNTER — Ambulatory Visit (INDEPENDENT_AMBULATORY_CARE_PROVIDER_SITE_OTHER): Payer: Medicare Other | Admitting: Family Medicine

## 2015-04-18 VITALS — BP 114/62 | HR 64 | Temp 98.7°F | Resp 18 | Ht 68.0 in | Wt 199.0 lb

## 2015-04-18 DIAGNOSIS — I25700 Atherosclerosis of coronary artery bypass graft(s), unspecified, with unstable angina pectoris: Secondary | ICD-10-CM | POA: Diagnosis not present

## 2015-04-18 DIAGNOSIS — K625 Hemorrhage of anus and rectum: Secondary | ICD-10-CM

## 2015-04-18 DIAGNOSIS — R531 Weakness: Secondary | ICD-10-CM

## 2015-04-18 LAB — POCT CBC
Granulocyte percent: 74 %G (ref 37–80)
HCT, POC: 35.7 % — AB (ref 43.5–53.7)
Hemoglobin: 10.9 g/dL — AB (ref 14.1–18.1)
Lymph, poc: 1.8 (ref 0.6–3.4)
MCH, POC: 29.6 pg (ref 27–31.2)
MCHC: 30.5 g/dL — AB (ref 31.8–35.4)
MCV: 97 fL (ref 80–97)
MID (cbc): 0.8 (ref 0–0.9)
MPV: 7.4 fL (ref 0–99.8)
POC Granulocyte: 7.5 — AB (ref 2–6.9)
POC LYMPH PERCENT: 17.9 %L (ref 10–50)
POC MID %: 8.1 %M (ref 0–12)
Platelet Count, POC: 222 10*3/uL (ref 142–424)
RBC: 3.68 M/uL — AB (ref 4.69–6.13)
RDW, POC: 14.3 %
WBC: 10.1 10*3/uL (ref 4.6–10.2)

## 2015-04-18 LAB — POCT SEDIMENTATION RATE: POCT SED RATE: 40 mm/hr — AB (ref 0–22)
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
## 2015-04-18 NOTE — Patient Instructions (Signed)
We are attempting to set up a gastroenterology consultation tomorrow because of the continued bleeding. Your lab tests and blood pressure are stable at this point area we will be getting the INR report back tomorrow to make sure that the clotting function of your blood is working normally. The creatinine and BUN are pending as well.

## 2015-04-19 LAB — COMPLETE METABOLIC PANEL WITH GFR
ALT: 13 U/L (ref 9–46)
AST: 18 U/L (ref 10–35)
Albumin: 3.7 g/dL (ref 3.6–5.1)
Alkaline Phosphatase: 53 U/L (ref 40–115)
BUN: 54 mg/dL — ABNORMAL HIGH (ref 7–25)
CO2: 26 mmol/L (ref 20–31)
Calcium: 8.9 mg/dL (ref 8.6–10.3)
Chloride: 109 mmol/L (ref 98–110)
Creat: 2.57 mg/dL — ABNORMAL HIGH (ref 0.70–1.11)
GFR, Est African American: 26 mL/min — ABNORMAL LOW (ref >=60)
GFR, Est Non African American: 22 mL/min — ABNORMAL LOW (ref >=60)
Glucose, Bld: 92 mg/dL (ref 65–99)
Potassium: 5 mmol/L (ref 3.5–5.3)
Sodium: 141 mmol/L (ref 135–146)
Total Bilirubin: 0.3 mg/dL (ref 0.2–1.2)
Total Protein: 6.7 g/dL (ref 6.1–8.1)

## 2015-04-19 LAB — PROTIME-INR
INR: 1.05 (ref <1.50)
Prothrombin Time: 13.8 seconds (ref 11.6–15.2)

## 2015-04-21 ENCOUNTER — Other Ambulatory Visit: Payer: Self-pay | Admitting: Physician Assistant

## 2015-04-22 ENCOUNTER — Encounter: Payer: Self-pay | Admitting: Gastroenterology

## 2015-04-22 NOTE — Telephone Encounter (Signed)
Please review for refill. Thanks!  

## 2015-04-23 ENCOUNTER — Ambulatory Visit (INDEPENDENT_AMBULATORY_CARE_PROVIDER_SITE_OTHER): Payer: Medicare Other | Admitting: Cardiology

## 2015-04-23 ENCOUNTER — Encounter: Payer: Self-pay | Admitting: Cardiology

## 2015-04-23 ENCOUNTER — Encounter: Payer: Medicare Other | Admitting: Pharmacist Clinician (PhC)/ Clinical Pharmacy Specialist

## 2015-04-23 VITALS — BP 110/58 | HR 55 | Ht 68.0 in | Wt 197.6 lb

## 2015-04-23 DIAGNOSIS — E785 Hyperlipidemia, unspecified: Secondary | ICD-10-CM

## 2015-04-23 DIAGNOSIS — I5032 Chronic diastolic (congestive) heart failure: Secondary | ICD-10-CM

## 2015-04-23 DIAGNOSIS — I10 Essential (primary) hypertension: Secondary | ICD-10-CM | POA: Diagnosis not present

## 2015-04-23 DIAGNOSIS — R609 Edema, unspecified: Secondary | ICD-10-CM | POA: Diagnosis not present

## 2015-04-23 DIAGNOSIS — Z7901 Long term (current) use of anticoagulants: Secondary | ICD-10-CM | POA: Diagnosis not present

## 2015-04-23 DIAGNOSIS — Z9861 Coronary angioplasty status: Secondary | ICD-10-CM

## 2015-04-23 DIAGNOSIS — I480 Paroxysmal atrial fibrillation: Secondary | ICD-10-CM

## 2015-04-23 DIAGNOSIS — I350 Nonrheumatic aortic (valve) stenosis: Secondary | ICD-10-CM | POA: Diagnosis not present

## 2015-04-23 DIAGNOSIS — E669 Obesity, unspecified: Secondary | ICD-10-CM

## 2015-04-23 DIAGNOSIS — I2510 Atherosclerotic heart disease of native coronary artery without angina pectoris: Secondary | ICD-10-CM | POA: Diagnosis not present

## 2015-04-23 DIAGNOSIS — R9431 Abnormal electrocardiogram [ECG] [EKG]: Secondary | ICD-10-CM

## 2015-04-23 DIAGNOSIS — I200 Unstable angina: Secondary | ICD-10-CM

## 2015-04-23 DIAGNOSIS — I4891 Unspecified atrial fibrillation: Secondary | ICD-10-CM

## 2015-04-23 DIAGNOSIS — K625 Hemorrhage of anus and rectum: Secondary | ICD-10-CM

## 2015-04-23 NOTE — Patient Instructions (Addendum)
CONTINUE TO HOLD TAKING WARFARIN (COUMADIN ) UNTIL YOU SEE GI DOCTOR   CONTINUE TAKING BRILLINTA  WEAR SUPPORT STOCKING - FOR SWELLING  MAY PURCHASE OVER THE COUNTER. 10-20 HGMM- KNEE HI   Your physician wants you to follow-up in [place]- 30 MIN APPOINTMENT.  You will receive a reminder letter in the mail two months in advance. If you don't receive a letter, please call our office to schedule the follow-up appointment.
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
## 2015-04-23 NOTE — Telephone Encounter (Signed)
Please review for refill.  

## 2015-04-23 NOTE — Progress Notes (Signed)
This encounter was created in error - please disregard.

## 2015-04-25 ENCOUNTER — Encounter: Payer: Self-pay | Admitting: Cardiology

## 2015-04-25 ENCOUNTER — Encounter (HOSPITAL_COMMUNITY)
Admission: RE | Admit: 2015-04-25 | Discharge: 2015-04-25 | Disposition: A | Discharge status: Home / Self Care | Payer: Medicare Other | Source: Ambulatory Visit | Attending: Cardiology | Admitting: Cardiology

## 2015-04-25 DIAGNOSIS — K625 Hemorrhage of anus and rectum: Secondary | ICD-10-CM | POA: Insufficient documentation

## 2015-04-25 DIAGNOSIS — Z48812 Encounter for surgical aftercare following surgery on the circulatory system: Secondary | ICD-10-CM | POA: Insufficient documentation

## 2015-04-25 DIAGNOSIS — Z955 Presence of coronary angioplasty implant and graft: Secondary | ICD-10-CM | POA: Insufficient documentation

## 2015-04-25 NOTE — Assessment & Plan Note (Addendum)
Status post recent PCI with DES to the LAD. Plavix allergy. Therefore we are limited Brilinta or Effient. With recent rectal bleeding, will use Brilinta along without aspirin. Especially as he also going to be on warfarin long-term. As long as there is no recurrence of bleeding, I would like to continue Brilinta for one year minimum. He did have a Synergy stent which would allow for pausing or cessation of the Brilinta if needed after 3-6 months. On statin (albeit low-dose), beta blocker with diltiazem.  Based on the fact that he had a mild acute coronary syndrome event, he should be okay to go back to work for maybe 4 hours a day at the end of the week. I would prefer to have the GI evaluation done prior to him starting, but I don't think that she be any issue with him doing at least 3-4 hours at this time. 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

## 2015-04-25 NOTE — Assessment & Plan Note (Signed)
On low-dose statin therapy. Last lipids were from January and relatively well-controlled.

## 2015-04-25 NOTE — Assessment & Plan Note (Signed)
He did have bleeding on triple medications. No bleeding in the last 10 days. Due for GI evaluation at the end of the month. For now I would continue to hold warfarin until we have clearance from GI that there is no significant bleeding etiology. I would then like to continue with Brilinta plus warfarin

## 2015-04-25 NOTE — Assessment & Plan Note (Signed)
As far as I can tell, no recurrence. He is on combination of Toprol and diltiazem. Rate is well-controlled to mildly bradycardic.  He does have some fatigue, but I think with his CAD and A. fib, beta blocker is appropriate treatment. Inguinal warfarin until we have a stable evaluation by GI. Would then restart warfarin.

## 2015-04-25 NOTE — Assessment & Plan Note (Signed)
Well-controlled on current meds 

## 2015-04-25 NOTE — Assessment & Plan Note (Signed)
Probably related to combination of venous insufficiency and mild diastolic dysfunction heart failure. Consider switching from Microzide to standing Lasix for better diuresis - defer to PCP following stabilization of renal function.

## 2015-04-25 NOTE — Assessment & Plan Note (Signed)
No further angina following PCI

## 2015-04-25 NOTE — Assessment & Plan Note (Signed)
The patient understands the need to lose weight with diet and exercise. We have discussed specific strategies for this.  

## 2015-04-25 NOTE — Assessment & Plan Note (Signed)
See atrial fibrillation section

## 2015-04-25 NOTE — Progress Notes (Signed)
Cardiac Rehab Medication Review by a Pharmacist  Does the patient  feel that his/her medications are working for him/her?  yes  Has the patient been experiencing any side effects to the medications prescribed?  no  Does the patient measure his/her own blood pressure or blood glucose at home?  Yes reports SBP in 120 range  Does the patient have any problems obtaining medications due to transportation or finances?   no  Understanding of regimen: good Understanding of indications: good Potential of compliance: good    Pharmacist comments: Presciber recently d/c warfarin and ASA 81 mg due to bleeding per patient. Pt reports good adherence and understanding of his medications. Pt only takes Pseudoephedrine PRN and not scheduled.  Warned pt of potential increase in BP with sudafed.     Bennye Alm, PharmD Pharmacy Resident [phone]

## 2015-04-25 NOTE — Assessment & Plan Note (Addendum)
Essentially normal EF by recent echocardiogram would argue diastolic dysfunction and not systolic. He is on, etc. beta blocker and calcium channel blocker with adequate blood pressure control for afterload reduction. He is on Microzide, and has chronic lower extremity edema. I think it may be reasonable to stop the Microzide and actually use Lasix. I will defer to his PCP we seem to be monitoring his renal function post-cath. Once we know stability, I would consider switching from HCTZ to Lasix as a low-dose standing dose with when necessary for increased edema and weight gain.

## 2015-04-26 ENCOUNTER — Telehealth: Payer: Self-pay | Admitting: Cardiology

## 2015-04-26 NOTE — Telephone Encounter (Signed)
Follow Up    Pt is calling to follow up on letter request to return to work part-time. Please call.

## 2015-04-28 ENCOUNTER — Encounter: Payer: Self-pay | Admitting: Cardiology

## 2015-04-29 ENCOUNTER — Encounter (HOSPITAL_COMMUNITY)
Admission: RE | Admit: 2015-04-29 | Discharge: 2015-04-29 | Disposition: A | Discharge status: Home / Self Care | Payer: Medicare Other | Source: Ambulatory Visit | Attending: Cardiology | Admitting: Cardiology

## 2015-04-29 ENCOUNTER — Other Ambulatory Visit (INDEPENDENT_AMBULATORY_CARE_PROVIDER_SITE_OTHER): Payer: Medicare Other | Admitting: Family Medicine

## 2015-04-29 DIAGNOSIS — Z955 Presence of coronary angioplasty implant and graft: Secondary | ICD-10-CM | POA: Diagnosis not present

## 2015-04-29 DIAGNOSIS — N289 Disorder of kidney and ureter, unspecified: Secondary | ICD-10-CM

## 2015-04-29 DIAGNOSIS — E539 Vitamin B deficiency, unspecified: Secondary | ICD-10-CM

## 2015-04-29 DIAGNOSIS — Z48812 Encounter for surgical aftercare following surgery on the circulatory system: Secondary | ICD-10-CM | POA: Diagnosis not present

## 2015-04-29 LAB — BASIC METABOLIC PANEL WITH GFR
BUN: 52 mg/dL — ABNORMAL HIGH (ref 7–25)
CHLORIDE: 106 mmol/L (ref 98–110)
CO2: 24 mmol/L (ref 20–31)
Calcium: 8.7 mg/dL (ref 8.6–10.3)
Creat: 2.34 mg/dL — ABNORMAL HIGH (ref 0.70–1.11)
GFR, EST NON AFRICAN AMERICAN: 25 mL/min — AB (ref >=60)
GFR, Est African American: 29 mL/min — ABNORMAL LOW (ref >=60)
Glucose, Bld: 94 mg/dL (ref 65–99)
POTASSIUM: 4.3 mmol/L (ref 3.5–5.3)
SODIUM: 141 mmol/L (ref 135–146)

## 2015-04-29 MED ORDER — CYANOCOBALAMIN 1000 MCG/ML IJ SOLN
1000.0000 ug | Freq: Once | INTRAMUSCULAR | Status: AC
  Administered 2015-04-29: 1000 ug via INTRAMUSCULAR

## 2015-04-29 NOTE — Progress Notes (Signed)
Pt started cardiac rehab today.  Pt tolerated light exercise without difficulty. VSS, telemetry-Normal Sinus Rhythm, asymptomatic.  Medication list reconciled.  Pt verbalized compliance with medications and denies barriers to compliance. PSYCHOSOCIAL ASSESSMENT:  PHQ-0. Pt exhibits positive coping skills, hopeful outlook with supportive family. No psychosocial needs identified at this time, no psychosocial interventions necessary.    .   Pt cardiac rehab  goal is  to gain his strength back.  Pt encouraged to participate in exercising on your own and functional fitness  to increase ability to achieve these goals.   Pt long term cardiac rehab goal is to"feel like the man I was at 81 years old."  Pt oriented to exercise equipment and routine.  Understanding verbalized.

## 2015-04-30 ENCOUNTER — Encounter: Payer: Self-pay | Admitting: Family Medicine

## 2015-05-01 ENCOUNTER — Encounter (HOSPITAL_COMMUNITY): Payer: Self-pay | Admitting: *Deleted

## 2015-05-01 ENCOUNTER — Telehealth: Payer: Self-pay | Admitting: Cardiology

## 2015-05-01 ENCOUNTER — Encounter (HOSPITAL_COMMUNITY)
Admission: RE | Admit: 2015-05-01 | Discharge: 2015-05-01 | Disposition: A | Discharge status: Home / Self Care | Payer: Medicare Other | Source: Ambulatory Visit | Attending: Cardiology | Admitting: Cardiology

## 2015-05-01 DIAGNOSIS — Z48812 Encounter for surgical aftercare following surgery on the circulatory system: Secondary | ICD-10-CM | POA: Diagnosis not present

## 2015-05-01 DIAGNOSIS — Z955 Presence of coronary angioplasty implant and graft: Secondary | ICD-10-CM | POA: Diagnosis not present

## 2015-05-01 NOTE — Progress Notes (Signed)
Patient wants a note for his return to work. I spoke with Charlotte Sanes RN. The patient will go by after class to pick up the note. Patient heart rate was noted at 49 today during cool down post exercise. Patient asymptomatic. Exit blood pressure 118/60. I notified Charlotte Sanes RN at Dr Tiaja Hagan [place] office about sinus brady. Will fax exercise flow sheets to Dr. Jeily Guthridge [place]  office for review with today's ECG tracing.

## 2015-05-01 NOTE — Telephone Encounter (Signed)
Mark Jackson called - patient at Cardiac Rehab and wanted to get his return to work letter. Reviewed chart and found this under letters tab.  Mark Jackson states unable to access on her end - patient asked if he could drop by office to pickup. Informed him letter would be printed and ready for him.  Printed, signature stamped, placed in check-in accordion file for patient to pick up later.

## 2015-05-03 ENCOUNTER — Encounter (HOSPITAL_COMMUNITY)
Admission: RE | Admit: 2015-05-03 | Discharge: 2015-05-03 | Disposition: A | Discharge status: Home / Self Care | Payer: Medicare Other | Source: Ambulatory Visit | Attending: Cardiology | Admitting: Cardiology

## 2015-05-03 DIAGNOSIS — Z48812 Encounter for surgical aftercare following surgery on the circulatory system: Secondary | ICD-10-CM | POA: Diagnosis not present

## 2015-05-03 DIAGNOSIS — Z955 Presence of coronary angioplasty implant and graft: Secondary | ICD-10-CM | POA: Diagnosis not present

## 2015-05-03 NOTE — Telephone Encounter (Signed)
Spoke to patient  Informed him letter is available for pick up at front [facility] on Monday 05/06/15 VERBALIZED UNDERSTANDING

## 2015-05-06 ENCOUNTER — Encounter (HOSPITAL_COMMUNITY)
Admission: RE | Admit: 2015-05-06 | Discharge: 2015-05-06 | Disposition: A | Discharge status: Home / Self Care | Payer: Medicare Other | Source: Ambulatory Visit | Attending: Cardiology | Admitting: Cardiology

## 2015-05-06 DIAGNOSIS — Z48812 Encounter for surgical aftercare following surgery on the circulatory system: Secondary | ICD-10-CM | POA: Diagnosis not present

## 2015-05-06 DIAGNOSIS — Z955 Presence of coronary angioplasty implant and graft: Secondary | ICD-10-CM | POA: Diagnosis not present

## 2015-05-08 ENCOUNTER — Encounter (HOSPITAL_COMMUNITY)
Admission: RE | Admit: 2015-05-08 | Discharge: 2015-05-08 | Disposition: A | Discharge status: Home / Self Care | Payer: Medicare Other | Source: Ambulatory Visit | Attending: Cardiology | Admitting: Cardiology

## 2015-05-08 DIAGNOSIS — Z955 Presence of coronary angioplasty implant and graft: Secondary | ICD-10-CM | POA: Diagnosis not present

## 2015-05-08 DIAGNOSIS — Z48812 Encounter for surgical aftercare following surgery on the circulatory system: Secondary | ICD-10-CM | POA: Diagnosis not present

## 2015-05-08 NOTE — Progress Notes (Signed)
QUALITY OF LIFE SCORE REVIEW Patient score low in area of health and functioning with a score of 16.14.  Scores less than 21 are considered low.  Patient quality of life slightly altered by physical constraints which limits ability to perform as prior to recent cardiac illness. Mr Settlemire reports feeling moderately dissatisfied about his recent coronary stent . Mr Floresca says he has felt his health declining in recent years. Offered emotional support and reassurance.  Will continue to monitor and intervene as necessary.  Mr Padget has an appointment with the gastroenterologist tomorrow.

## 2015-05-09 ENCOUNTER — Ambulatory Visit (INDEPENDENT_AMBULATORY_CARE_PROVIDER_SITE_OTHER): Payer: Medicare Other | Admitting: Gastroenterology

## 2015-05-09 ENCOUNTER — Encounter: Payer: Self-pay | Admitting: Gastroenterology

## 2015-05-09 VITALS — BP 128/60 | HR 58 | Ht 69.0 in | Wt 194.1 lb

## 2015-05-09 DIAGNOSIS — K625 Hemorrhage of anus and rectum: Secondary | ICD-10-CM | POA: Diagnosis not present

## 2015-05-09 DIAGNOSIS — K648 Other hemorrhoids: Secondary | ICD-10-CM

## 2015-05-09 DIAGNOSIS — Z7901 Long term (current) use of anticoagulants: Secondary | ICD-10-CM | POA: Diagnosis not present

## 2015-05-09 MED ORDER — HYDROCORTISONE 2.5 % RE CREA: 1.0000 "application " | TOPICAL_CREAM | Freq: Two times a day (BID) | RECTAL | Status: DC

## 2015-05-09 NOTE — Patient Instructions (Addendum)
We have sent the following medications to your pharmacy for you to pick up at your convenience: Hydrocortisone cream.  Please follow up with Dr Carlean Purl on 06-19-2015 at 3pm

## 2015-05-10 ENCOUNTER — Encounter (HOSPITAL_COMMUNITY)
Admission: RE | Admit: 2015-05-10 | Discharge: 2015-05-10 | Disposition: A | Discharge status: Home / Self Care | Payer: Medicare Other | Source: Ambulatory Visit | Attending: Cardiology | Admitting: Cardiology

## 2015-05-10 DIAGNOSIS — Z48812 Encounter for surgical aftercare following surgery on the circulatory system: Secondary | ICD-10-CM | POA: Diagnosis not present

## 2015-05-10 DIAGNOSIS — Z955 Presence of coronary angioplasty implant and graft: Secondary | ICD-10-CM | POA: Diagnosis not present

## 2015-05-10 NOTE — Progress Notes (Signed)
Reviewed home exercise guidelines with patient including endpoints, temperature precautions, target heart rate and rate of perceived exertion. Pt plans to walk and has a stationary bike at home which he plans to use as his mode of home exercise. Pt voices understanding of instructions given. Sol Passer, MS, ACSM CCEP

## 2015-05-13 ENCOUNTER — Encounter (HOSPITAL_COMMUNITY)
Admission: RE | Admit: 2015-05-13 | Discharge: 2015-05-13 | Disposition: A | Discharge status: Home / Self Care | Payer: Medicare Other | Source: Ambulatory Visit | Attending: Cardiology | Admitting: Cardiology

## 2015-05-13 DIAGNOSIS — Z48812 Encounter for surgical aftercare following surgery on the circulatory system: Secondary | ICD-10-CM | POA: Insufficient documentation

## 2015-05-13 DIAGNOSIS — Z955 Presence of coronary angioplasty implant and graft: Secondary | ICD-10-CM | POA: Diagnosis not present

## 2015-05-13 DIAGNOSIS — C61 Malignant neoplasm of prostate: Secondary | ICD-10-CM | POA: Diagnosis not present

## 2015-05-14 ENCOUNTER — Encounter: Payer: Self-pay | Admitting: Emergency Medicine

## 2015-05-15 ENCOUNTER — Encounter (HOSPITAL_COMMUNITY)
Admission: RE | Admit: 2015-05-15 | Discharge: 2015-05-15 | Disposition: A | Discharge status: Home / Self Care | Payer: Medicare Other | Source: Ambulatory Visit | Attending: Cardiology | Admitting: Cardiology

## 2015-05-15 ENCOUNTER — Telehealth: Payer: Self-pay | Admitting: Cardiology

## 2015-05-15 DIAGNOSIS — Z48812 Encounter for surgical aftercare following surgery on the circulatory system: Secondary | ICD-10-CM | POA: Diagnosis not present

## 2015-05-15 DIAGNOSIS — Z955 Presence of coronary angioplasty implant and graft: Secondary | ICD-10-CM | POA: Diagnosis not present

## 2015-05-15 NOTE — Telephone Encounter (Signed)
Verdis Frederickson calling to inform Mark Jackson patient does not have a Nitro SL rx - should he have this?  No recent episodes of acute CP.  Informed her I would route to Dr. Ellyn Hack to advise if this is needed.

## 2015-05-15 NOTE — Telephone Encounter (Signed)
Yes he should have when necessary nitroglycerin prescription  [facility]

## 2015-05-16 ENCOUNTER — Encounter: Payer: Self-pay | Admitting: Gastroenterology

## 2015-05-16 ENCOUNTER — Ambulatory Visit: Payer: Medicare Other | Admitting: Emergency Medicine

## 2015-05-16 DIAGNOSIS — K648 Other hemorrhoids: Secondary | ICD-10-CM | POA: Insufficient documentation

## 2015-05-16 DIAGNOSIS — N183 Chronic kidney disease, stage 3 (moderate): Secondary | ICD-10-CM | POA: Diagnosis not present

## 2015-05-16 DIAGNOSIS — D631 Anemia in chronic kidney disease: Secondary | ICD-10-CM | POA: Diagnosis not present

## 2015-05-16 MED ORDER — NITROGLYCERIN 0.4 MG SL SUBL: 0.4000 mg | SUBLINGUAL_TABLET | SUBLINGUAL | Status: DC | PRN

## 2015-05-16 NOTE — Telephone Encounter (Signed)
Called patient at home. Left message for him to return call.

## 2015-05-16 NOTE — Telephone Encounter (Signed)
Received call back earlier today asking me to call back after 4pm.  Called back, explained conversation between myself and Verdis Frederickson and explained Dr. Allison Quarry recommendation for [facility] as needed. Pt voiced understanding of medication use and rationale. Spent ~8 minutes of conversation on topic of this medication. He acknowledged - sent Rx to his preferred pharmacy. No other concerns, requests, or questions from patient.

## 2015-05-17 ENCOUNTER — Encounter (HOSPITAL_COMMUNITY)
Admission: RE | Admit: 2015-05-17 | Discharge: 2015-05-17 | Disposition: A | Discharge status: Home / Self Care | Payer: Medicare Other | Source: Ambulatory Visit | Attending: Cardiology | Admitting: Cardiology

## 2015-05-17 ENCOUNTER — Other Ambulatory Visit: Payer: Self-pay | Admitting: Cardiovascular Disease

## 2015-05-17 ENCOUNTER — Other Ambulatory Visit: Payer: Self-pay | Admitting: Nephrology

## 2015-05-17 DIAGNOSIS — Z48812 Encounter for surgical aftercare following surgery on the circulatory system: Secondary | ICD-10-CM | POA: Diagnosis not present

## 2015-05-17 DIAGNOSIS — N183 Chronic kidney disease, stage 3 unspecified: Secondary | ICD-10-CM

## 2015-05-17 DIAGNOSIS — C61 Malignant neoplasm of prostate: Secondary | ICD-10-CM | POA: Diagnosis not present

## 2015-05-17 DIAGNOSIS — R351 Nocturia: Secondary | ICD-10-CM | POA: Diagnosis not present

## 2015-05-17 DIAGNOSIS — Z955 Presence of coronary angioplasty implant and graft: Secondary | ICD-10-CM | POA: Diagnosis not present
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
## 2015-05-17 NOTE — Telephone Encounter (Signed)
Rx request sent to pharmacy.  

## 2015-05-20 ENCOUNTER — Encounter (HOSPITAL_COMMUNITY)
Admission: RE | Admit: 2015-05-20 | Discharge: 2015-05-20 | Disposition: A | Discharge status: Home / Self Care | Payer: Medicare Other | Source: Ambulatory Visit | Attending: Cardiology | Admitting: Cardiology

## 2015-05-20 DIAGNOSIS — Z7901 Long term (current) use of anticoagulants: Secondary | ICD-10-CM | POA: Insufficient documentation

## 2015-05-20 DIAGNOSIS — Z48812 Encounter for surgical aftercare following surgery on the circulatory system: Secondary | ICD-10-CM | POA: Diagnosis not present

## 2015-05-20 DIAGNOSIS — Z955 Presence of coronary angioplasty implant and graft: Secondary | ICD-10-CM | POA: Diagnosis not present

## 2015-05-20 NOTE — Progress Notes (Signed)
Dr Moshe Cipro Mr Nee's nephrologist discontinued his Microzide and decreased his betablocker to 1/2 tablet once a day.

## 2015-05-21 ENCOUNTER — Ambulatory Visit
Admission: RE | Admit: 2015-05-21 | Discharge: 2015-05-21 | Disposition: A | Discharge status: Home / Self Care | Payer: Medicare Other | Source: Ambulatory Visit | Attending: Nephrology | Admitting: Nephrology

## 2015-05-21 DIAGNOSIS — N183 Chronic kidney disease, stage 3 unspecified: Secondary | ICD-10-CM

## 2015-05-21 DIAGNOSIS — N184 Chronic kidney disease, stage 4 (severe): Secondary | ICD-10-CM | POA: Diagnosis not present

## 2015-05-22 ENCOUNTER — Encounter (HOSPITAL_COMMUNITY)
Admission: RE | Admit: 2015-05-22 | Discharge: 2015-05-22 | Disposition: A | Discharge status: Home / Self Care | Payer: Medicare Other | Source: Ambulatory Visit | Attending: Cardiology | Admitting: Cardiology

## 2015-05-22 DIAGNOSIS — Z48812 Encounter for surgical aftercare following surgery on the circulatory system: Secondary | ICD-10-CM | POA: Diagnosis not present

## 2015-05-22 DIAGNOSIS — Z955 Presence of coronary angioplasty implant and graft: Secondary | ICD-10-CM | POA: Diagnosis not present

## 2015-05-24 ENCOUNTER — Encounter (HOSPITAL_COMMUNITY)
Admission: RE | Admit: 2015-05-24 | Discharge: 2015-05-24 | Disposition: A | Discharge status: Home / Self Care | Payer: Medicare Other | Source: Ambulatory Visit | Attending: Cardiology | Admitting: Cardiology

## 2015-05-24 DIAGNOSIS — Z48812 Encounter for surgical aftercare following surgery on the circulatory system: Secondary | ICD-10-CM | POA: Diagnosis not present

## 2015-05-24 DIAGNOSIS — Z955 Presence of coronary angioplasty implant and graft: Secondary | ICD-10-CM | POA: Diagnosis not present

## 2015-05-27 ENCOUNTER — Encounter (HOSPITAL_COMMUNITY)
Admission: RE | Admit: 2015-05-27 | Discharge: 2015-05-27 | Disposition: A | Discharge status: Home / Self Care | Payer: Medicare Other | Source: Ambulatory Visit | Attending: Cardiology | Admitting: Cardiology

## 2015-05-27 DIAGNOSIS — Z955 Presence of coronary angioplasty implant and graft: Secondary | ICD-10-CM | POA: Diagnosis not present

## 2015-05-27 DIAGNOSIS — Z48812 Encounter for surgical aftercare following surgery on the circulatory system: Secondary | ICD-10-CM | POA: Diagnosis not present

## 2015-05-28 ENCOUNTER — Ambulatory Visit (INDEPENDENT_AMBULATORY_CARE_PROVIDER_SITE_OTHER): Payer: Medicare Other | Admitting: Emergency Medicine

## 2015-05-28 ENCOUNTER — Encounter: Payer: Self-pay | Admitting: Emergency Medicine

## 2015-05-28 VITALS — BP 138/75 | HR 53 | Temp 97.7°F | Resp 16 | Ht 69.0 in

## 2015-05-28 DIAGNOSIS — I200 Unstable angina: Secondary | ICD-10-CM

## 2015-05-28 DIAGNOSIS — I10 Essential (primary) hypertension: Secondary | ICD-10-CM

## 2015-05-28 DIAGNOSIS — I129 Hypertensive chronic kidney disease with stage 1 through stage 4 chronic kidney disease, or unspecified chronic kidney disease: Secondary | ICD-10-CM

## 2015-05-28 DIAGNOSIS — K625 Hemorrhage of anus and rectum: Secondary | ICD-10-CM | POA: Diagnosis not present

## 2015-05-28 MED ORDER — CLOTRIMAZOLE-BETAMETHASONE 1-0.05 % EX CREA: 1.0000 "application " | TOPICAL_CREAM | Freq: Two times a day (BID) | CUTANEOUS | Status: DC
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
## 2015-05-29 ENCOUNTER — Encounter (HOSPITAL_COMMUNITY)
Admission: RE | Admit: 2015-05-29 | Discharge: 2015-05-29 | Disposition: A | Discharge status: Home / Self Care | Payer: Medicare Other | Source: Ambulatory Visit | Attending: Cardiology | Admitting: Cardiology

## 2015-05-29 DIAGNOSIS — Z48812 Encounter for surgical aftercare following surgery on the circulatory system: Secondary | ICD-10-CM | POA: Diagnosis not present

## 2015-05-29 DIAGNOSIS — Z955 Presence of coronary angioplasty implant and graft: Secondary | ICD-10-CM | POA: Diagnosis not present

## 2015-05-30 NOTE — Progress Notes (Signed)
Agree with Ms. Alphia Kava management. I saw patient also.  Gatha Mayer, MD, [facility]

## 2015-05-31 ENCOUNTER — Encounter (HOSPITAL_COMMUNITY)
Admission: RE | Admit: 2015-05-31 | Discharge: 2015-05-31 | Disposition: A | Discharge status: Home / Self Care | Payer: Medicare Other | Source: Ambulatory Visit | Attending: Cardiology | Admitting: Cardiology

## 2015-05-31 DIAGNOSIS — Z955 Presence of coronary angioplasty implant and graft: Secondary | ICD-10-CM | POA: Diagnosis not present

## 2015-05-31 DIAGNOSIS — Z48812 Encounter for surgical aftercare following surgery on the circulatory system: Secondary | ICD-10-CM | POA: Diagnosis not present

## 2015-06-03 ENCOUNTER — Encounter (HOSPITAL_COMMUNITY)
Admission: RE | Admit: 2015-06-03 | Discharge: 2015-06-03 | Disposition: A | Discharge status: Home / Self Care | Payer: Medicare Other | Source: Ambulatory Visit | Attending: Cardiology | Admitting: Cardiology

## 2015-06-03 DIAGNOSIS — Z955 Presence of coronary angioplasty implant and graft: Secondary | ICD-10-CM | POA: Diagnosis not present

## 2015-06-03 DIAGNOSIS — Z48812 Encounter for surgical aftercare following surgery on the circulatory system: Secondary | ICD-10-CM | POA: Diagnosis not present

## 2015-06-03 NOTE — Progress Notes (Signed)
Mark Jackson. 81 y.o. male Nutrition Note  Spoke with pt. Nutrition Plan and Nutrition Survey goals reviewed with pt. Pt is following Step 2 of the Therapeutic Lifestyle Changes diet according to MEDFICTS results. Pt eats out "most meals." Pt states he eats at [employer] frequently. Pt states he eats 1/2 of his food at 1 meal and 1/2 at another. Given pt dx of CHF, sodium content of food when eating out discussed. Pt reports he does not "add any salt to the food." Pt is pre-diabetic according to his last A1c.  Pt with dx of CHF. Pt expressed understanding of the information reviewed. Pt Coumadin continues to be held due to bleeding. Pt aware of nutrition education classes offered.  Lab Results  Component Value Date   HGBA1C 6.0* 01/12/2014   Nutrition Diagnosis ? Food-and nutrition-related knowledge deficit related to lack of exposure to information as related to diagnosis of: ? CVD ? Pre-DM  Nutrition RX/ Estimated Daily Nutrition Needs for: wt  maintenance 2250-2550 Kcal, 75-85 gm fat, 14-17 gm sat fat, 2.2-2.6 gm trans-fat, <1500 mg sodium  Nutrition Intervention ? Pt's individual nutrition plan reviewed with pt. ? Benefits of adopting Therapeutic Lifestyle Changes discussed when Medficts reviewed. ? Pt to attend the Portion Distortion class  ? Pt given handouts for: ? Nutrition I class ? Nutrition II class ? Continue client-centered nutrition education by RD, as part of interdisciplinary care. Goal(s) ? Pt to describe the benefit of including fruits, vegetables, whole grains, and low-fat dairy products in a heart healthy meal plan. Monitor and Evaluate progress toward nutrition goal with team. Nutrition Risk: Change to Moderate Derek Mound, M.Ed, RD, LDN, CDE 06/03/2015 11:26 AM

## 2015-06-04 DIAGNOSIS — N183 Chronic kidney disease, stage 3 (moderate): Secondary | ICD-10-CM | POA: Diagnosis not present

## 2015-06-05 ENCOUNTER — Encounter (HOSPITAL_COMMUNITY)
Admission: RE | Admit: 2015-06-05 | Discharge: 2015-06-05 | Disposition: A | Discharge status: Home / Self Care | Payer: Medicare Other | Source: Ambulatory Visit | Attending: Cardiology | Admitting: Cardiology

## 2015-06-05 DIAGNOSIS — Z48812 Encounter for surgical aftercare following surgery on the circulatory system: Secondary | ICD-10-CM | POA: Diagnosis not present

## 2015-06-05 DIAGNOSIS — Z955 Presence of coronary angioplasty implant and graft: Secondary | ICD-10-CM | POA: Diagnosis not present

## 2015-06-06 ENCOUNTER — Ambulatory Visit: Payer: Self-pay | Admitting: Emergency Medicine

## 2015-06-07 ENCOUNTER — Encounter (HOSPITAL_COMMUNITY)
Admission: RE | Admit: 2015-06-07 | Discharge: 2015-06-07 | Disposition: A | Discharge status: Home / Self Care | Payer: Medicare Other | Source: Ambulatory Visit | Attending: Cardiology | Admitting: Cardiology

## 2015-06-07 DIAGNOSIS — Z48812 Encounter for surgical aftercare following surgery on the circulatory system: Secondary | ICD-10-CM | POA: Diagnosis not present

## 2015-06-07 DIAGNOSIS — Z955 Presence of coronary angioplasty implant and graft: Secondary | ICD-10-CM | POA: Diagnosis not present

## 2015-06-10 ENCOUNTER — Encounter (HOSPITAL_COMMUNITY)
Admission: RE | Admit: 2015-06-10 | Discharge: 2015-06-10 | Disposition: A | Discharge status: Home / Self Care | Payer: Medicare Other | Source: Ambulatory Visit | Attending: Cardiology | Admitting: Cardiology

## 2015-06-10 DIAGNOSIS — Z48812 Encounter for surgical aftercare following surgery on the circulatory system: Secondary | ICD-10-CM | POA: Diagnosis not present

## 2015-06-10 DIAGNOSIS — Z955 Presence of coronary angioplasty implant and graft: Secondary | ICD-10-CM | POA: Diagnosis not present

## 2015-06-12 ENCOUNTER — Encounter (HOSPITAL_COMMUNITY)
Admission: RE | Admit: 2015-06-12 | Discharge: 2015-06-12 | Disposition: A | Discharge status: Home / Self Care | Payer: Medicare Other | Source: Ambulatory Visit | Attending: Cardiology | Admitting: Cardiology

## 2015-06-12 DIAGNOSIS — Z955 Presence of coronary angioplasty implant and graft: Secondary | ICD-10-CM | POA: Diagnosis not present

## 2015-06-12 DIAGNOSIS — Z48812 Encounter for surgical aftercare following surgery on the circulatory system: Secondary | ICD-10-CM | POA: Insufficient documentation

## 2015-06-13 ENCOUNTER — Encounter: Payer: Self-pay | Admitting: Cardiology

## 2015-06-14 ENCOUNTER — Encounter (HOSPITAL_COMMUNITY)
Admission: RE | Admit: 2015-06-14 | Discharge: 2015-06-14 | Disposition: A | Discharge status: Home / Self Care | Payer: Medicare Other | Source: Ambulatory Visit | Attending: Cardiology | Admitting: Cardiology

## 2015-06-14 DIAGNOSIS — Z955 Presence of coronary angioplasty implant and graft: Secondary | ICD-10-CM | POA: Diagnosis not present

## 2015-06-14 DIAGNOSIS — Z48812 Encounter for surgical aftercare following surgery on the circulatory system: Secondary | ICD-10-CM | POA: Diagnosis not present

## 2015-06-17 ENCOUNTER — Encounter (HOSPITAL_COMMUNITY)
Admission: RE | Admit: 2015-06-17 | Discharge: 2015-06-17 | Disposition: A | Discharge status: Home / Self Care | Payer: Medicare Other | Source: Ambulatory Visit | Attending: Cardiology | Admitting: Cardiology

## 2015-06-17 DIAGNOSIS — Z955 Presence of coronary angioplasty implant and graft: Secondary | ICD-10-CM | POA: Diagnosis not present

## 2015-06-17 DIAGNOSIS — Z48812 Encounter for surgical aftercare following surgery on the circulatory system: Secondary | ICD-10-CM | POA: Diagnosis not present

## 2015-06-19 ENCOUNTER — Ambulatory Visit (INDEPENDENT_AMBULATORY_CARE_PROVIDER_SITE_OTHER): Payer: Medicare Other | Admitting: Internal Medicine

## 2015-06-19 ENCOUNTER — Encounter: Payer: Self-pay | Admitting: Internal Medicine

## 2015-06-19 ENCOUNTER — Encounter (HOSPITAL_COMMUNITY)
Admission: RE | Admit: 2015-06-19 | Discharge: 2015-06-19 | Disposition: A | Discharge status: Home / Self Care | Payer: Medicare Other | Source: Ambulatory Visit | Attending: Cardiology | Admitting: Cardiology

## 2015-06-19 VITALS — BP 122/70 | HR 70 | Ht 69.0 in | Wt 199.0 lb

## 2015-06-19 DIAGNOSIS — I480 Paroxysmal atrial fibrillation: Secondary | ICD-10-CM | POA: Diagnosis not present

## 2015-06-19 DIAGNOSIS — Z48812 Encounter for surgical aftercare following surgery on the circulatory system: Secondary | ICD-10-CM | POA: Diagnosis not present

## 2015-06-19 DIAGNOSIS — I200 Unstable angina: Secondary | ICD-10-CM | POA: Diagnosis not present

## 2015-06-19 DIAGNOSIS — K648 Other hemorrhoids: Secondary | ICD-10-CM | POA: Diagnosis not present

## 2015-06-19 DIAGNOSIS — Z7901 Long term (current) use of anticoagulants: Secondary | ICD-10-CM

## 2015-06-19 DIAGNOSIS — Z955 Presence of coronary angioplasty implant and graft: Secondary | ICD-10-CM | POA: Diagnosis not present

## 2015-06-19 DIAGNOSIS — L309 Dermatitis, unspecified: Secondary | ICD-10-CM | POA: Diagnosis not present

## 2015-06-19 MED ORDER — HYDROCORTISONE 2.5 % RE CREA: 1.0000 "application " | TOPICAL_CREAM | Freq: Two times a day (BID) | RECTAL | Status: DC

## 2015-06-19 MED ORDER — NYSTATIN-TRIAMCINOLONE 100000-0.1 UNIT/GM-% EX OINT: 1.0000 "application " | TOPICAL_OINTMENT | Freq: Two times a day (BID) | CUTANEOUS | Status: DC

## 2015-06-19 NOTE — Assessment & Plan Note (Signed)
I think we can retry warfarin

## 2015-06-19 NOTE — Patient Instructions (Addendum)
   Use your Lotrisone cream in your gluteal (butt) crease.   We have sent the following medications to your pharmacy for you to pick up at your convenience: hydrocortisone cream   We will see you on 08/20/2015 at 1:45pm.   I appreciate the opportunity to care for you. Silvano Rusk, MD, [facility]

## 2015-06-19 NOTE — Assessment & Plan Note (Addendum)
Observe Refill HC cream RTC Jan Ok to retry warfarin

## 2015-06-19 NOTE — Assessment & Plan Note (Signed)
OK to retry warfarin

## 2015-06-19 NOTE — Progress Notes (Signed)
   Subjective:    Patient ID: Mark Jackson., male    DOB: 07-13-1930, 81 y.o.   MRN: [id number] Cc: bleeding hemorrhoids HPI Seen previously about 1 month ago was having bleeding hemorrhoids in setting of warfarin, Brilinta and ASA. Warfarin held but persisted. We Tx w/ HC cream and he is bleeding sig less. Dr. Ellyn Hack said late Nov earliest we could hold Brilinta after DES placed in late Aug 2016  Medications, allergies, past medical history, past surgical history, family history and social history are reviewed and updated in the EMR.  Review of Systems As above    Objective:   Physical Exam BP 122/70 mmHg  Pulse 70  Ht 5\' 9"  (1.753 m)  Wt 199 lb (90.266 kg)  BMI 29.37 kg/m2 Inspection of anal area - small anal tags, perianal dermatitis - gluteal crease     Assessment & Plan:  Internal bleeding hemorrhoids  Long term current use of anticoagulant therapy  Paroxysmal atrial fibrillation ([facility])  Perianal dermatitis  He is better so will continue HC cream - banding not a good option given need for AcTx Should be ok to retry warfarin but would stop again if more bleeding (severe) monitor Hgb Use Lotrisone for dermatitis See me in Jan - sooner if needed  I appreciate the opportunity to care for this patient. [id number], [name], MD [name], MD

## 2015-06-21 ENCOUNTER — Encounter (HOSPITAL_COMMUNITY)
Admission: RE | Admit: 2015-06-21 | Discharge: 2015-06-21 | Disposition: A | Discharge status: Home / Self Care | Payer: Medicare Other | Source: Ambulatory Visit | Attending: Cardiology | Admitting: Cardiology

## 2015-06-21 ENCOUNTER — Encounter: Payer: Self-pay | Admitting: Internal Medicine

## 2015-06-21 DIAGNOSIS — Z48812 Encounter for surgical aftercare following surgery on the circulatory system: Secondary | ICD-10-CM | POA: Diagnosis not present

## 2015-06-21 DIAGNOSIS — Z955 Presence of coronary angioplasty implant and graft: Secondary | ICD-10-CM | POA: Diagnosis not present

## 2015-06-24 ENCOUNTER — Encounter (HOSPITAL_COMMUNITY)
Admission: RE | Admit: 2015-06-24 | Discharge: 2015-06-24 | Disposition: A | Discharge status: Home / Self Care | Payer: Medicare Other | Source: Ambulatory Visit | Attending: Cardiology | Admitting: Cardiology

## 2015-06-24 DIAGNOSIS — Z955 Presence of coronary angioplasty implant and graft: Secondary | ICD-10-CM | POA: Diagnosis not present

## 2015-06-24 DIAGNOSIS — Z48812 Encounter for surgical aftercare following surgery on the circulatory system: Secondary | ICD-10-CM | POA: Diagnosis not present

## 2015-06-26 ENCOUNTER — Encounter (HOSPITAL_COMMUNITY)
Admission: RE | Admit: 2015-06-26 | Discharge: 2015-06-26 | Disposition: A | Discharge status: Home / Self Care | Payer: Medicare Other | Source: Ambulatory Visit | Attending: Cardiology | Admitting: Cardiology

## 2015-06-26 DIAGNOSIS — Z955 Presence of coronary angioplasty implant and graft: Secondary | ICD-10-CM | POA: Diagnosis not present

## 2015-06-26 DIAGNOSIS — Z48812 Encounter for surgical aftercare following surgery on the circulatory system: Secondary | ICD-10-CM | POA: Diagnosis not present

## 2015-06-28 ENCOUNTER — Encounter (HOSPITAL_COMMUNITY)
Admission: RE | Admit: 2015-06-28 | Discharge: 2015-06-28 | Disposition: A | Discharge status: Home / Self Care | Payer: Medicare Other | Source: Ambulatory Visit | Attending: Cardiology | Admitting: Cardiology

## 2015-06-28 DIAGNOSIS — Z48812 Encounter for surgical aftercare following surgery on the circulatory system: Secondary | ICD-10-CM | POA: Diagnosis not present

## 2015-06-28 DIAGNOSIS — Z955 Presence of coronary angioplasty implant and graft: Secondary | ICD-10-CM | POA: Diagnosis not present

## 2015-07-01 ENCOUNTER — Encounter (HOSPITAL_COMMUNITY)
Admission: RE | Admit: 2015-07-01 | Discharge: 2015-07-01 | Disposition: A | Discharge status: Home / Self Care | Payer: Medicare Other | Source: Ambulatory Visit | Attending: Cardiology | Admitting: Cardiology

## 2015-07-01 DIAGNOSIS — Z48812 Encounter for surgical aftercare following surgery on the circulatory system: Secondary | ICD-10-CM | POA: Diagnosis not present

## 2015-07-01 DIAGNOSIS — Z955 Presence of coronary angioplasty implant and graft: Secondary | ICD-10-CM | POA: Diagnosis not present

## 2015-07-03 ENCOUNTER — Encounter (HOSPITAL_COMMUNITY)
Admission: RE | Admit: 2015-07-03 | Discharge: 2015-07-03 | Disposition: A | Discharge status: Home / Self Care | Payer: Medicare Other | Source: Ambulatory Visit | Attending: Cardiology | Admitting: Cardiology

## 2015-07-03 ENCOUNTER — Encounter: Payer: Self-pay | Admitting: Physician Assistant

## 2015-07-03 ENCOUNTER — Ambulatory Visit (INDEPENDENT_AMBULATORY_CARE_PROVIDER_SITE_OTHER): Payer: Medicare Other | Admitting: Physician Assistant

## 2015-07-03 VITALS — BP 133/70 | HR 55 | Temp 97.6°F | Resp 18 | Ht 69.0 in | Wt 202.0 lb

## 2015-07-03 DIAGNOSIS — I200 Unstable angina: Secondary | ICD-10-CM | POA: Diagnosis not present

## 2015-07-03 DIAGNOSIS — D7589 Other specified diseases of blood and blood-forming organs: Secondary | ICD-10-CM | POA: Diagnosis not present

## 2015-07-03 DIAGNOSIS — Z8639 Personal history of other endocrine, nutritional and metabolic disease: Secondary | ICD-10-CM

## 2015-07-03 DIAGNOSIS — Z955 Presence of coronary angioplasty implant and graft: Secondary | ICD-10-CM | POA: Diagnosis not present

## 2015-07-03 DIAGNOSIS — Z48812 Encounter for surgical aftercare following surgery on the circulatory system: Secondary | ICD-10-CM | POA: Diagnosis not present

## 2015-07-03 MED ORDER — CYANOCOBALAMIN 1000 MCG/ML IJ SOLN
1000.0000 ug | INTRAMUSCULAR | Status: DC
  Administered 2015-07-03 – 2016-03-03 (×7): 1000 ug via INTRAMUSCULAR

## 2015-07-03 NOTE — Progress Notes (Addendum)
07/03/2015 2:11 PM   DOB: [birth year] / MRN: [id number]  SUBJECTIVE:  Mark Jackson. is a 81 y.o. male presenting for a vitmamin B-12 injection and reports he has a standing order for this.  Reports that he was first diagnosed with an anemia back in the 1960 and after a few dizzy spells.  Reports his wife was in the hospital at the time of the diagnosis and that how he can remember when he was diagnosed.  States he feels no change if he misses a month of therapy, but starts to feel poorly if he misses more that two months.     He last saw Dr. Everlene Farrier on 05/28/15 and is scheduled to see him back in December of this year.    He is allergic to plavix.   He  has a past medical history of Essential hypertension; Left ventricular diastolic dysfunction, NYHA class 1 (March 2014; Aug 2016); Anemia; Carotid artery disease ([place]); Hyperlipidemia; Obesity (BMI 30-39.9) (09/03/2013); Essential hypertension (10/22/2008); Atrial fibrillation, new onset ([place]) (08/20/2014); Long term current use of anticoagulant therapy (08/27/2014); Mild aortic stenosis by prior echocardiogram (March 2014 ); Prostate cancer ([place]); Skin cancer; Chronic kidney disease (CKD), stage III (moderate) B; Migraine; Headache; Arthritis; Chronic lower back pain; and CAD S/P percutaneous coronary angioplasty (03/21/2015).    He  reports that he has quit smoking. His smoking use included Pipe and Cigars. He has never used smokeless tobacco. He reports that he does not drink alcohol or use illicit drugs. He  reports that he does not engage in sexual activity. The patient  has past surgical history that includes Knee arthroscopy (Bilateral); Tonsillectomy and adenoidectomy; NM MYOVIEW LTD (February 2012; 09/05/2014); CAROTID DOPPLER (10/21/2012); transthoracic echocardiogram (10/21/2012; 08/21/2014); TEE without cardioversion (N/A, 08/22/2014); Cardioversion (N/A, 08/22/2014); Appendectomy; Joint replacement; Total knee arthroplasty (Right, 05/2009);  Cataract extraction w/ intraocular lens  implant, bilateral (Bilateral); Insertion prostate radiation seed (04/2007); Cardiac catheterization (N/A, 03/21/2015); Cardiac catheterization (03/21/2015); and Colonoscopy.  His family history includes Other in his brother and brother; Ovarian cancer in his mother; Suicidality in his father; Testicular cancer in his son.  Review of Systems  Constitutional: Negative for fever and chills.  Eyes: Negative for blurred vision.  Respiratory: Negative for cough.   Gastrointestinal: Negative for nausea and abdominal pain.  Genitourinary: Negative for dysuria, urgency and frequency.  Musculoskeletal: Negative for myalgias.  Skin: Negative for rash.  Neurological: Negative for dizziness, tingling and headaches.  Psychiatric/Behavioral: Negative for depression. The patient is not nervous/anxious.     Problem list and medications reviewed and updated by myself where necessary, and exist elsewhere in the encounter.   OBJECTIVE:  BP 133/70 mmHg  Pulse 55  Temp(Src) 97.6 F (36.4 C) (Oral)  Resp 18  Ht 5\' 9"  (1.753 m)  Wt 202 lb (91.627 kg)  BMI 29.82 kg/m2  SpO2 98% CrCl cannot be calculated (Patient has no serum creatinine result on file.).   Physical Exam  Constitutional: He is oriented to person, place, and time. He appears well-developed. He does not appear ill.  Eyes: Conjunctivae and EOM are normal. Pupils are equal, round, and reactive to light.  Cardiovascular: Normal heart sounds.  Bradycardia present.   Pulmonary/Chest: Effort normal.  Abdominal: He exhibits no distension.  Musculoskeletal: Normal range of motion.  Neurological: He is alert and oriented to person, place, and time. No cranial nerve deficit. Coordination normal.  Skin: Skin is warm and dry. He is not diaphoretic.  Psychiatric: He has a normal  mood and affect.  Nursing note and vitals reviewed.  No results found for this or any previous visit (from the past 48  hour(s)).  ASSESSMENT AND PLAN  Radeen was seen today for b12 injection.  Diagnoses and all orders for this visit:  Macrocytosis: Advised that we will continue this order. He is under the care of Dr. Everlene Farrier and multiple CBC show and an anemia and borderline macrocytosis. Close follow up in place.   -     cyanocobalamin ((VITAMIN B-12)) injection 1,000 mcg; Inject 1 mL (1,000 mcg total) into the muscle every 30 (thirty) days.  History of non anemic vitamin B12 deficiency -     cyanocobalamin ((VITAMIN B-12)) injection 1,000 mcg; Inject 1 mL (1,000 mcg total) into the muscle every 30 (thirty) days.  The patient was advised to call or return to clinic if he does not see an improvement in symptoms or to seek the care of the closest emergency department if he worsens with the above plan.   Philis Fendt, MHS, PA-C [facility] 07/03/2015 2:11 PM

## 2015-07-08 ENCOUNTER — Encounter (HOSPITAL_COMMUNITY)
Admission: RE | Admit: 2015-07-08 | Discharge: 2015-07-08 | Disposition: A | Discharge status: Home / Self Care | Payer: Medicare Other | Source: Ambulatory Visit | Attending: Cardiology | Admitting: Cardiology

## 2015-07-08 DIAGNOSIS — Z955 Presence of coronary angioplasty implant and graft: Secondary | ICD-10-CM | POA: Diagnosis not present

## 2015-07-08 DIAGNOSIS — Z48812 Encounter for surgical aftercare following surgery on the circulatory system: Secondary | ICD-10-CM | POA: Diagnosis not present

## 2015-07-10 ENCOUNTER — Encounter (HOSPITAL_COMMUNITY)
Admission: RE | Admit: 2015-07-10 | Discharge: 2015-07-10 | Disposition: A | Discharge status: Home / Self Care | Payer: Medicare Other | Source: Ambulatory Visit | Attending: Cardiology | Admitting: Cardiology

## 2015-07-10 DIAGNOSIS — Z955 Presence of coronary angioplasty implant and graft: Secondary | ICD-10-CM | POA: Diagnosis not present

## 2015-07-10 DIAGNOSIS — Z48812 Encounter for surgical aftercare following surgery on the circulatory system: Secondary | ICD-10-CM | POA: Diagnosis not present

## 2015-07-12 ENCOUNTER — Encounter (HOSPITAL_COMMUNITY)
Admission: RE | Admit: 2015-07-12 | Discharge: 2015-07-12 | Disposition: A | Discharge status: Home / Self Care | Payer: Medicare Other | Source: Ambulatory Visit | Attending: Cardiology | Admitting: Cardiology

## 2015-07-12 DIAGNOSIS — Z955 Presence of coronary angioplasty implant and graft: Secondary | ICD-10-CM | POA: Insufficient documentation

## 2015-07-12 DIAGNOSIS — Z48812 Encounter for surgical aftercare following surgery on the circulatory system: Secondary | ICD-10-CM | POA: Insufficient documentation

## 2015-07-15 ENCOUNTER — Encounter (HOSPITAL_COMMUNITY)
Admission: RE | Admit: 2015-07-15 | Discharge: 2015-07-15 | Disposition: A | Discharge status: Home / Self Care | Payer: Medicare Other | Source: Ambulatory Visit | Attending: Cardiology | Admitting: Cardiology

## 2015-07-15 DIAGNOSIS — Z48812 Encounter for surgical aftercare following surgery on the circulatory system: Secondary | ICD-10-CM | POA: Diagnosis not present

## 2015-07-15 DIAGNOSIS — Z955 Presence of coronary angioplasty implant and graft: Secondary | ICD-10-CM | POA: Diagnosis not present

## 2015-07-16 ENCOUNTER — Ambulatory Visit (INDEPENDENT_AMBULATORY_CARE_PROVIDER_SITE_OTHER): Payer: Medicare Other | Admitting: Cardiology

## 2015-07-16 VITALS — BP 122/66 | HR 60 | Ht 67.0 in | Wt 196.6 lb

## 2015-07-16 DIAGNOSIS — I2510 Atherosclerotic heart disease of native coronary artery without angina pectoris: Secondary | ICD-10-CM | POA: Diagnosis not present

## 2015-07-16 DIAGNOSIS — E785 Hyperlipidemia, unspecified: Secondary | ICD-10-CM | POA: Diagnosis not present

## 2015-07-16 DIAGNOSIS — I480 Paroxysmal atrial fibrillation: Secondary | ICD-10-CM

## 2015-07-16 DIAGNOSIS — Z9861 Coronary angioplasty status: Secondary | ICD-10-CM

## 2015-07-16 DIAGNOSIS — I200 Unstable angina: Secondary | ICD-10-CM | POA: Diagnosis not present

## 2015-07-16 DIAGNOSIS — I5032 Chronic diastolic (congestive) heart failure: Secondary | ICD-10-CM

## 2015-07-16 DIAGNOSIS — I350 Nonrheumatic aortic (valve) stenosis: Secondary | ICD-10-CM

## 2015-07-16 DIAGNOSIS — I10 Essential (primary) hypertension: Secondary | ICD-10-CM

## 2015-07-16 NOTE — Patient Instructions (Addendum)
   Your physician wants you to follow-up in FEB [facility] 2017 WITH DR Ten Broeck.- 30 MIN  You will receive a reminder letter in the mail two months in advance. If you don't receive a letter, please call our office to schedule the follow-up appointment.  STOP COUMADIN (WARFARIN) STOP ASPIRIN CONTINUE BRILINTA  If you need a refill on your cardiac medications before your next appointment, please call your pharmacy.
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
## 2015-07-17 ENCOUNTER — Encounter (HOSPITAL_COMMUNITY)
Admission: RE | Admit: 2015-07-17 | Discharge: 2015-07-17 | Disposition: A | Discharge status: Home / Self Care | Payer: Medicare Other | Source: Ambulatory Visit | Attending: Cardiology | Admitting: Cardiology

## 2015-07-17 DIAGNOSIS — Z955 Presence of coronary angioplasty implant and graft: Secondary | ICD-10-CM | POA: Diagnosis not present

## 2015-07-17 DIAGNOSIS — Z48812 Encounter for surgical aftercare following surgery on the circulatory system: Secondary | ICD-10-CM | POA: Diagnosis not present

## 2015-07-18 ENCOUNTER — Encounter: Payer: Self-pay | Admitting: Cardiology

## 2015-07-18 DIAGNOSIS — N183 Chronic kidney disease, stage 3 (moderate): Secondary | ICD-10-CM | POA: Diagnosis not present

## 2015-07-18 DIAGNOSIS — D631 Anemia in chronic kidney disease: Secondary | ICD-10-CM | POA: Diagnosis not present

## 2015-07-18 NOTE — Assessment & Plan Note (Signed)
Tolerating dose statin. Had relatively well-controlled lipids in January. Is due for repeat labs.

## 2015-07-18 NOTE — Assessment & Plan Note (Signed)
Overall doing much better status post PCI. He has an allergy to Plavix, therefore limited to Brilinta or Effient. -- Would prefer to continue with Brilinta complete at least one year.  He has an LAD stent, however revealing data with newest generation DES stents would indicate that he should be fine stopping antiplatelet agent at one year. He is on combination of diltiazem and metoprolol for it and the fact and blood pressure. At low dose. Tolerated well.

## 2015-07-18 NOTE — Assessment & Plan Note (Signed)
Well-controlled on current meds 

## 2015-07-18 NOTE — Assessment & Plan Note (Signed)
Stable. Not a significant amount stenosis. We'll monitor with echocardiogram next year.

## 2015-07-18 NOTE — Assessment & Plan Note (Signed)
Notably improved following PCI. Continue aggressive blood pressure control. Not currently requiring diuretic.

## 2015-07-18 NOTE — Assessment & Plan Note (Addendum)
No obvious recurrence. On beta blocker and calcium channel blocker for rate control. Currently on Brilinta and not warfarin -- The basilar fact is still having a significant amount of diarrhea with intermittent blood in the stool, prefer to hold off until this is resolved. When we do it now addition our discussion the because of the slightly increased risk of stroke while not being on clinical correlation, however based on how difficult things were initially, I would like to allow for these fully heal from the GI standpoint. At that point we can discuss whether we restart warfarin versus a DOAC.  --> recent data suggested safety benefit of DOAC over warfarin even with Brilinta - when lower dose used.

## 2015-07-19 ENCOUNTER — Encounter (HOSPITAL_COMMUNITY)
Admission: RE | Admit: 2015-07-19 | Discharge: 2015-07-19 | Disposition: A | Discharge status: Home / Self Care | Payer: Medicare Other | Source: Ambulatory Visit | Attending: Cardiology | Admitting: Cardiology

## 2015-07-19 DIAGNOSIS — Z955 Presence of coronary angioplasty implant and graft: Secondary | ICD-10-CM | POA: Diagnosis not present

## 2015-07-19 DIAGNOSIS — Z48812 Encounter for surgical aftercare following surgery on the circulatory system: Secondary | ICD-10-CM | POA: Diagnosis not present

## 2015-07-22 ENCOUNTER — Encounter (HOSPITAL_COMMUNITY)
Admission: RE | Admit: 2015-07-22 | Discharge: 2015-07-22 | Disposition: A | Discharge status: Home / Self Care | Payer: Medicare Other | Source: Ambulatory Visit | Attending: Cardiology | Admitting: Cardiology

## 2015-07-22 DIAGNOSIS — Z955 Presence of coronary angioplasty implant and graft: Secondary | ICD-10-CM | POA: Diagnosis not present

## 2015-07-22 DIAGNOSIS — Z48812 Encounter for surgical aftercare following surgery on the circulatory system: Secondary | ICD-10-CM | POA: Diagnosis not present

## 2015-07-24 ENCOUNTER — Encounter (HOSPITAL_COMMUNITY): Payer: Medicare Other

## 2015-07-26 ENCOUNTER — Encounter (HOSPITAL_COMMUNITY): Payer: Medicare Other

## 2015-07-29 ENCOUNTER — Encounter: Payer: Self-pay | Admitting: Internal Medicine

## 2015-07-29 ENCOUNTER — Ambulatory Visit (INDEPENDENT_AMBULATORY_CARE_PROVIDER_SITE_OTHER): Payer: Medicare Other | Admitting: Internal Medicine

## 2015-07-29 ENCOUNTER — Other Ambulatory Visit (INDEPENDENT_AMBULATORY_CARE_PROVIDER_SITE_OTHER): Payer: Medicare Other

## 2015-07-29 ENCOUNTER — Encounter (HOSPITAL_COMMUNITY): Payer: Medicare Other

## 2015-07-29 VITALS — BP 130/72 | HR 62 | Ht 69.0 in | Wt 199.1 lb

## 2015-07-29 DIAGNOSIS — K625 Hemorrhage of anus and rectum: Secondary | ICD-10-CM

## 2015-07-29 DIAGNOSIS — I480 Paroxysmal atrial fibrillation: Secondary | ICD-10-CM

## 2015-07-29 DIAGNOSIS — Z7902 Long term (current) use of antithrombotics/antiplatelets: Secondary | ICD-10-CM | POA: Diagnosis not present

## 2015-07-29 DIAGNOSIS — R194 Change in bowel habit: Secondary | ICD-10-CM | POA: Diagnosis not present

## 2015-07-29 DIAGNOSIS — I2510 Atherosclerotic heart disease of native coronary artery without angina pectoris: Secondary | ICD-10-CM | POA: Diagnosis not present

## 2015-07-29 DIAGNOSIS — I200 Unstable angina: Secondary | ICD-10-CM | POA: Diagnosis not present

## 2015-07-29 DIAGNOSIS — Z9861 Coronary angioplasty status: Secondary | ICD-10-CM | POA: Diagnosis not present

## 2015-07-29 LAB — CBC WITH DIFFERENTIAL/PLATELET
BASOS PCT: 0.3 % (ref 0.0–3.0)
Basophils Absolute: 0 10*3/uL (ref 0.0–0.1)
EOS PCT: 3.7 % (ref 0.0–5.0)
Eosinophils Absolute: 0.4 10*3/uL (ref 0.0–0.7)
HCT: 33.7 % — ABNORMAL LOW (ref 39.0–52.0)
Hemoglobin: 11.3 g/dL — ABNORMAL LOW (ref 13.0–17.0)
LYMPHS ABS: 1.1 10*3/uL (ref 0.7–4.0)
Lymphocytes Relative: 9.7 % — ABNORMAL LOW (ref 12.0–46.0)
MCHC: 33.6 g/dL (ref 30.0–36.0)
MCV: 97.3 fl (ref 78.0–100.0)
MONOS PCT: 9.2 % (ref 3.0–12.0)
Monocytes Absolute: 1 10*3/uL (ref 0.1–1.0)
Neutro Abs: 8.8 10*3/uL — ABNORMAL HIGH (ref 1.4–7.7)
Neutrophils Relative %: 77.1 % — ABNORMAL HIGH (ref 43.0–77.0)
PLATELETS: 224 10*3/uL (ref 150.0–400.0)
RBC: 3.46 Mil/uL — AB (ref 4.22–5.81)
RDW: 14 % (ref 11.5–15.5)
WBC: 11.4 10*3/uL — ABNORMAL HIGH (ref 4.0–10.5)

## 2015-07-29 NOTE — Progress Notes (Signed)
Subjective:    Patient ID: Mark Jackson., male    DOB: April 24, 1930, 81 y.o.   MRN: [id number] Cc: rectal bleeding, diarrhea HPI Mark Jackson returns. I have been seeing him for bleeding thought to be due from hemorrhoids. He has been on Brilinta, aspirin and warfarin are held. He has coronary artery disease with stenting to the proximal LAD August 2016. Warfarin and aspirin have been held because of the bleeding hemorrhoids.  He is also having problems with what he describes as diarrhea, he has urgent defecation unanticipated and soiled himself at times. He has taken to wearing diapers. He says this started in July 2015. It does not seem that there were any medication changes or anything like that then. His stools tend to be loose which is different than what they used to be. These looser stools an urgent defecation spells tend to aggravate his hemorrhoids, he last bled on December 12. That's mainly with wiping. He has been using some hydrocortisone topically with some benefit. He brought a list from July 2015 of the number of episodes of what he calls diarrhea. He had 5 in December, this month, 5 in November, 3 in October 10 in September which is before we started seeing him. Anywhere from 1-4 months going back to July 2015.   He sometimes just HAS seepage but he will have a full bowel movement 2 and then not move his bowels the next day though he has been using Imodium after an episode. He recently about 4 days ago started taking Imodium every afternoon since he seemed to have problems in the afternoons. He has reduced fiber in his diet as well. That seems to be making a difference perhaps.  He wants to travel to [place] to see his grandson graduate in the middle of January. He plans to do so.  Wt Readings from Last 3 Encounters:  07/29/15 199 lb 2 oz (90.323 kg)  07/16/15 196 lb 9.6 oz (89.177 kg)  07/03/15 202 lb (91.627 kg)   Medications, allergies, past medical history, past surgical  history, family history and social history are reviewed and updated in the EMR.  Review of Systems As above.    Objective:   Physical Exam @BP  130/72 mmHg  Pulse 62  Ht 5\' 9"  (1.753 m)  Wt 199 lb 2 oz (90.323 kg)  BMI 29.39 kg/m2@  General:  NAD Eyes:   anicteric Lungs:  clear Heart:: S1S2 no rubs, murmurs or gallops Rectal -            Inspect - mild perianal dermatitis Abdomen:  soft and nontender, BS+ Ext:   no edema, cyanosis or clubbing    Data Reviewed:   Cardiology and primary care notes since I last saw him in November. Creatinine 1.99 in late October.     Assessment & Plan:   1. Rectal bleeding thought to be from internal hemorrhoids, also w/ Hx XRT to prostate  2. Change in bowel habits   3. Long term current use of antithrombotics/antiplatelets   4. CAD S/P percutaneous coronary angioplasty   5. PAF (paroxysmal atrial fibrillation) ([place])    I agree with using an Imodium every day. Hopefully that will help in reducing fiber. Some of the fecal soiling could be from his hemorrhoids and loss of some continence. The intermittent urgent loose defecation sounds like IBS but in his age group and since it's been 7 years since she's had a colonoscopy I think he needs one. Question colorectal  neoplasia though unlikely as possible. This does not sound like a microscopic colitis. It could be symptomatic diverticulosis. He does have severe diverticulosis in the sigmoid colon. It's possible promoting more defecation could be the answer to relieve his symptoms also.  I plan to have him do a colonoscopy after he goes to [place]. I wondered to check with Dr. Ellyn Hack about modifying his antiplatelet agents. Dr. Ellyn Hack and previously told me we should wait at least 3 months before holding Brilinta. I think it would be good for him to go on an aspirin a day if I did his colonoscopy and he was off Brilinta. That way we could try to mitigate some of the loss of antiplatelet effect by  holding Brilinta. Ultimately it is hoped to get him back on warfarin to reduce the risk of stroke and his paroxysmal atrial fibrillation.  Consideration for hemorrhoid banding is also undertaken here, if he could perhaps control his bowel habits perhaps his hemorrhoids would bother him less as well.  He also has a hx of prostate cancer radiation so radiation proctitis is possible cause of bleeding also. Extra care in RA hemorrhoid Tx area if banding done.  Consider doing this procedure at hospital where [facility] available - no proctitis 2010.  The risks and benefits as well as alternatives of endoscopic procedure(s) have been discussed and reviewed. All questions answered. The patient agrees to proceed. Increased risk of coronary event, vascular event off antiplatelet and anticoagulants discussed as well.  Recheck CBC  I appreciate the opportunity to care for this patient. I will send a copy to Mark Jackson M.D.

## 2015-07-29 NOTE — Progress Notes (Signed)
Quick Note:  Please let him know he has a stable, mild anemia - so good news ______

## 2015-07-29 NOTE — Assessment & Plan Note (Signed)
Thought due to hemorrhoids Colonoscopy - off Brillinta

## 2015-07-29 NOTE — Patient Instructions (Signed)
   Continue your Imodium daily.   Your physician has requested that you go to the basement for the following lab work before leaving today: CBC/diff   Dr. Carlean Purl will consult with Dr Ellyn Hack about doing a colonoscopy in February and managing your anti-coag.  We will be in touch about this.    I appreciate the opportunity to care for you. Silvano Rusk, MD, [facility]

## 2015-07-31 ENCOUNTER — Encounter (HOSPITAL_COMMUNITY): Payer: Medicare Other

## 2015-08-01 ENCOUNTER — Ambulatory Visit: Payer: Medicare Other

## 2015-08-01 DIAGNOSIS — D649 Anemia, unspecified: Secondary | ICD-10-CM | POA: Diagnosis not present

## 2015-08-02 ENCOUNTER — Encounter (HOSPITAL_COMMUNITY): Payer: Medicare Other

## 2015-08-20 ENCOUNTER — Ambulatory Visit: Payer: Medicare Other | Admitting: Internal Medicine

## 2015-08-29 ENCOUNTER — Telehealth: Payer: Self-pay

## 2015-08-29 NOTE — Telephone Encounter (Signed)
Patient notified  He is scheduled for 09/18/15 and pre-visit for 08/30/15

## 2015-08-29 NOTE — Telephone Encounter (Signed)
-----   Message from Gatha Mayer, MD sent at 08/28/2015  5:54 PM EST ----- Regarding: needs colonoscopy off Brillinta on 81 mg asa He needs a colonoscopy set up for Feb  Was going to a graduation this month so maybe call next week  See below...         That would probably be OK -- no real good choice.   [name], MD       Previous Messages    ----- Message -----   From: Gatha Mayer, MD   Sent: 07/29/2015  9:57 AM    To: [name], MD  Subject: anti-plt adjustment for colonoscopy        I think he needs a colonoscopy to evaluate bowel changes and bleeding   You had previously indicated ok to hold Brillinta after Nov   Looking at Feb for colonoscopy  I thought would make sense to add back ASA 81 mg when off Brillinta   Ok to hold the [place] 7 d and put back on ASA for colonoscopy?   Thanks   Glendell Docker

## 2015-08-30 ENCOUNTER — Telehealth: Payer: Self-pay | Admitting: *Deleted

## 2015-08-30 NOTE — Telephone Encounter (Signed)
Mark Jackson: pt is scheduled for PV Monday 1/23 and colonoscopy on 2/8.  Please review chart to see if pt is ok for [facility].  MI with stent in August 2016 and mild aortic stenosis on  Echocardiogram.  Thanks, Juliann Pulse in [facility]

## 2015-09-02 ENCOUNTER — Ambulatory Visit (AMBULATORY_SURGERY_CENTER): Payer: Self-pay

## 2015-09-02 VITALS — Wt 200.4 lb

## 2015-09-02 DIAGNOSIS — R194 Change in bowel habit: Secondary | ICD-10-CM

## 2015-09-02 NOTE — Progress Notes (Signed)
No allergies to eggs or soy No past problems with anesthesia No diet/weight loss meds No home oxygen  Has email and internet; refused emmi

## 2015-09-02 NOTE — Telephone Encounter (Signed)
Mark Jackson,  This pt is cleared for anesthetic care at [facility]

## 2015-09-06 ENCOUNTER — Ambulatory Visit (INDEPENDENT_AMBULATORY_CARE_PROVIDER_SITE_OTHER): Payer: Medicare Other | Admitting: Family Medicine

## 2015-09-06 DIAGNOSIS — E539 Vitamin B deficiency, unspecified: Secondary | ICD-10-CM | POA: Diagnosis not present

## 2015-09-06 DIAGNOSIS — D649 Anemia, unspecified: Secondary | ICD-10-CM

## 2015-09-18 ENCOUNTER — Encounter: Payer: Self-pay | Admitting: Internal Medicine

## 2015-09-18 ENCOUNTER — Ambulatory Visit (AMBULATORY_SURGERY_CENTER): Payer: Medicare Other | Admitting: Internal Medicine

## 2015-09-18 VITALS — BP 158/68 | HR 59 | Temp 96.5°F | Resp 20 | Ht 69.5 in | Wt 200.0 lb

## 2015-09-18 DIAGNOSIS — R194 Change in bowel habit: Secondary | ICD-10-CM

## 2015-09-18 DIAGNOSIS — K648 Other hemorrhoids: Secondary | ICD-10-CM

## 2015-09-18 DIAGNOSIS — I2510 Atherosclerotic heart disease of native coronary artery without angina pectoris: Secondary | ICD-10-CM | POA: Diagnosis not present

## 2015-09-18 DIAGNOSIS — K5730 Diverticulosis of large intestine without perforation or abscess without bleeding: Secondary | ICD-10-CM

## 2015-09-18 DIAGNOSIS — D123 Benign neoplasm of transverse colon: Secondary | ICD-10-CM

## 2015-09-18 MED ORDER — SODIUM CHLORIDE 0.9 % IV SOLN: 500.0000 mL | INTRAVENOUS | Status: DC

## 2015-09-18 NOTE — Progress Notes (Signed)
Called to room to assist during endoscopic procedure.  Patient ID and intended procedure confirmed with present staff. Received instructions for my participation in the procedure from the performing physician.  

## 2015-09-18 NOTE — Patient Instructions (Addendum)
I found and removed 4 tiny polyps. I also saw diverticulosis (pockets) and hemorrhoids.  You have internal and external hemorrhoids that are causing the bleeding. I cannot fix external hemorrhoids - that usually  surgery which is something you should avoid I think.  Restart Brillinta tomorrow. I will communicate with Dr. Ellyn Hack about the warfarin.  I appreciate the opportunity to care for you. Gatha Mayer, MD, FACG   YOU HAD AN ENDOSCOPIC PROCEDURE TODAY AT [facility]:   Refer to the procedure report that was given to you for any specific questions about what was found during the examination.  If the procedure report does not answer your questions, please call your gastroenterologist to clarify.  If you requested that your care partner not be given the details of your procedure findings, then the procedure report has been included in a sealed envelope for you to review at your convenience later.  YOU SHOULD EXPECT: Some feelings of bloating in the abdomen. Passage of more gas than usual.  Walking can help get rid of the air that was put into your GI tract during the procedure and reduce the bloating. If you had a lower endoscopy (such as a colonoscopy or flexible sigmoidoscopy) you may notice spotting of blood in your stool or on the toilet paper. If you underwent a bowel prep for your procedure, you may not have a normal bowel movement for a few days.  Please Note:  You might notice some irritation and congestion in your nose or some drainage.  This is from the oxygen used during your procedure.  There is no need for concern and it should clear up in a day or so.  SYMPTOMS TO REPORT IMMEDIATELY:   Following lower endoscopy (colonoscopy or flexible sigmoidoscopy):  Excessive amounts of blood in the stool  Significant tenderness or worsening of abdominal pains  Swelling of the abdomen that is new, acute  Fever of 100F or higher   For urgent or emergent  issues, a gastroenterologist can be reached at any hour by calling [phone].   DIET: Your first meal following the procedure should be a small meal and then it is ok to progress to your normal diet. Heavy or fried foods are harder to digest and may make you feel nauseous or bloated.  Likewise, meals heavy in dairy and vegetables can increase bloating.  Drink plenty of fluids but you should avoid alcoholic beverages for 24 hours.  ACTIVITY:  You should plan to take it easy for the rest of today and you should NOT DRIVE or use heavy machinery until tomorrow (because of the sedation medicines used during the test).    FOLLOW UP: Our staff will call the number listed on your records the next business day following your procedure to check on you and address any questions or concerns that you may have regarding the information given to you following your procedure. If we do not reach you, we will leave a message.  However, if you are feeling well and you are not experiencing any problems, there is no need to return our call.  We will assume that you have returned to your regular daily activities without incident.  If any biopsies were taken you will be contacted by phone or by letter within the next 1-3 weeks.  Please call us at [phone] if you have not heard about the biopsies in 3 weeks.    SIGNATURES/CONFIDENTIALITY: You and/or your care partner have signed  paperwork which will be entered into your electronic medical record.  These signatures attest to the fact that that the information above on your After Visit Summary has been reviewed and is understood.  Full responsibility of the confidentiality of this discharge information lies with you and/or your care-partner.  Please review polyp, diverticulosis, hemorrhoid, and high fiber diet handouts provided.

## 2015-09-18 NOTE — Op Note (Signed)
[place]  [address] [place], [id number]   COLONOSCOPY PROCEDURE REPORT  PATIENT: Mark Jackson, Mark Jackson  MR#: [id number] BIRTHDATE: 1930/01/14 , 85  yrs. old GENDER: male ENDOSCOPIST: Gatha Mayer, MD, [facility] PROCEDURE DATE:  09/18/2015 PROCEDURE:   Colonoscopy, diagnostic and Colonoscopy with snare polypectomy First Screening Colonoscopy - Avg.  risk and is 50 yrs.  old or older - No.  Prior Negative Screening - Now for repeat screening. N/A  History of Adenoma - Now for follow-up colonoscopy & has been > or = to 3 yrs.  N/A  Polyps removed today? Yes ASA CLASS:   Class III INDICATIONS:Evaluation of unexplained GI bleeding and Patient is not applicable for Colorectal Neoplasm Risk Assessment for this procedure. MEDICATIONS: Propofol 150 mg IV and Monitored anesthesia care  DESCRIPTION OF PROCEDURE:   After the risks benefits and alternatives of the procedure were thoroughly explained, informed consent was obtained.  The digital rectal exam revealed no rectal mass.   The LB [id number]  endoscope was introduced through the anus and advanced to the cecum, which was identified by both the appendix and ileocecal valve. No adverse events experienced. The quality of the prep was good.  (MiraLax was used)  The instrument was then slowly withdrawn as the colon was fully examined. Estimated blood loss is zero unless otherwise noted in this procedure report.      COLON FINDINGS: Four polypoid shaped sessile polyps ranging from 3 to 64mm in size were found in the transverse colon.  Polypectomies were performed with a cold snare.  The resection was complete, the polyp tissue was completely retrieved and sent to histology. There was moderate diverticulosis noted in the sigmoid colon. External and internal hemorrhoids were found.   The examination was otherwise normal.  Retroflexed views revealed internal hemorrhoids and Retroflexed views revealed external  hemorrhoids. The time to cecum = 2.7 Withdrawal time = 12.2   The scope was withdrawn and the procedure completed. COMPLICATIONS: There were no immediate complications.  ENDOSCOPIC IMPRESSION: 1.   Four sessile polyps ranging from 3 to 45mm in size were found in the transverse colon; polypectomies were performed with a cold snare 2.   Moderate diverticulosis was noted in the sigmoid colon 3.   External and internal hemorrhoids 4.   The examination was otherwise normal  RECOMMENDATIONS: 1.  Restart Brillinta tomorrow 2.  Hemorrhoids are source of bleeding - I doubt banding will fix these - at least not the externals.  Will discuss further anti-coagulation (he has A fib and should be on warfarin too) with Dr.  Ellyn Hack.  No easy options.  eSigned:  Gatha Mayer, MD, [facility] 09/18/2015 10:12 AM   cc: The Patient Dr. [name] Dr. [name]   PATIENT NAME:  Mark Jackson, Mark Jackson MR#: [id number]

## 2015-09-18 NOTE — Progress Notes (Signed)
Patient awakening,vss,report to rn 

## 2015-09-19 ENCOUNTER — Telehealth: Payer: Self-pay | Admitting: *Deleted

## 2015-09-19 NOTE — Telephone Encounter (Signed)
  Follow up Call-  Call back number 09/18/2015  Post procedure Call Back phone  # [phone]  Permission to leave phone message Yes     Patient questions:  Do you have a fever, pain , or abdominal swelling? No. Pain Score  0 *  Have you tolerated food without any problems? Yes.    Have you been able to return to your normal activities? Yes.    Do you have any questions about your discharge instructions: Diet   No. Medications  No. Follow up visit  No.  Do you have questions or concerns about your Care? No.  Actions: * If pain score is 4 or above: No action needed, pain <4.

## 2015-09-25 NOTE — Progress Notes (Signed)
Quick Note:  Please call from office:  Polyps benign - no cancer I discussed w/ Dr. Ellyn Hack and our plan is to stay on [place] until August and then most likely switch to warfarin. As long as bleeding is not too bad with hemorrhoid cream and controlling diarrhea with loperamide - I would not band the hemorrhoids. If he has more bleeding that indicates a need for banding Dr. Ellyn Hack suggested using aspirin and holding [place]. It sounded like things were going ok when I saw him at colonoscopy - so if they are no changes. Let me know if worsening bleeding. He should have a CBC when he sees Dr. Everlene Farrier later this month - I am ccing Dr. Baruch Goldmann - no letter or recall ______

## 2015-10-01 ENCOUNTER — Ambulatory Visit: Payer: Medicare Other | Admitting: Emergency Medicine

## 2015-10-03 ENCOUNTER — Ambulatory Visit (INDEPENDENT_AMBULATORY_CARE_PROVIDER_SITE_OTHER): Payer: Medicare Other | Admitting: Emergency Medicine

## 2015-10-03 VITALS — BP 182/83 | HR 50 | Temp 97.6°F | Resp 16 | Ht 70.0 in | Wt 195.0 lb

## 2015-10-03 DIAGNOSIS — I129 Hypertensive chronic kidney disease with stage 1 through stage 4 chronic kidney disease, or unspecified chronic kidney disease: Secondary | ICD-10-CM | POA: Diagnosis not present

## 2015-10-03 DIAGNOSIS — I4891 Unspecified atrial fibrillation: Secondary | ICD-10-CM

## 2015-10-03 DIAGNOSIS — I10 Essential (primary) hypertension: Secondary | ICD-10-CM | POA: Diagnosis not present

## 2015-10-03 DIAGNOSIS — E539 Vitamin B deficiency, unspecified: Secondary | ICD-10-CM | POA: Diagnosis not present

## 2015-10-03 DIAGNOSIS — D649 Anemia, unspecified: Secondary | ICD-10-CM | POA: Diagnosis not present

## 2015-10-03 DIAGNOSIS — I2510 Atherosclerotic heart disease of native coronary artery without angina pectoris: Secondary | ICD-10-CM | POA: Diagnosis not present

## 2015-10-03 LAB — CBC WITH DIFFERENTIAL/PLATELET
BASOS PCT: 0 % (ref 0–1)
Basophils Absolute: 0 10*3/uL (ref 0.0–0.1)
EOS PCT: 4 % (ref 0–5)
Eosinophils Absolute: 0.3 10*3/uL (ref 0.0–0.7)
HCT: 37 % — ABNORMAL LOW (ref 39.0–52.0)
Hemoglobin: 11.8 g/dL — ABNORMAL LOW (ref 13.0–17.0)
Lymphocytes Relative: 18 % (ref 12–46)
Lymphs Abs: 1.5 10*3/uL (ref 0.7–4.0)
MCH: 31.1 pg (ref 26.0–34.0)
MCHC: 31.9 g/dL (ref 30.0–36.0)
MCV: 97.6 fL (ref 78.0–100.0)
MONO ABS: 0.7 10*3/uL (ref 0.1–1.0)
MPV: 9.9 fL (ref 8.6–12.4)
Monocytes Relative: 8 % (ref 3–12)
Neutro Abs: 6 10*3/uL (ref 1.7–7.7)
Neutrophils Relative %: 70 % (ref 43–77)
Platelets: 202 10*3/uL (ref 150–400)
RBC: 3.79 MIL/uL — AB (ref 4.22–5.81)
RDW: 13.8 % (ref 11.5–15.5)
WBC: 8.6 10*3/uL (ref 4.0–10.5)

## 2015-10-03 LAB — BASIC METABOLIC PANEL WITH GFR
BUN: 27 mg/dL — ABNORMAL HIGH (ref 7–25)
CALCIUM: 8.6 mg/dL (ref 8.6–10.3)
CO2: 21 mmol/L (ref 20–31)
CREATININE: 1.64 mg/dL — AB (ref 0.70–1.11)
Chloride: 109 mmol/L (ref 98–110)
GFR, EST AFRICAN AMERICAN: 44 mL/min — AB (ref >=60)
GFR, EST NON AFRICAN AMERICAN: 38 mL/min — AB (ref >=60)
GLUCOSE: 78 mg/dL (ref 65–99)
Potassium: 4 mmol/L (ref 3.5–5.3)
SODIUM: 140 mmol/L (ref 135–146)

## 2015-10-03 MED ORDER — FUROSEMIDE 20 MG PO TABS: 20.0000 mg | ORAL_TABLET | Freq: Every day | ORAL | Status: DC

## 2015-10-03 NOTE — Patient Instructions (Signed)
I have started you on Lasix 20 mg 1 every morning. Please record your  blood pressures at home. Let me check you in 3-4 months at the walk-in clinic.
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
## 2015-10-15 DIAGNOSIS — D485 Neoplasm of uncertain behavior of skin: Secondary | ICD-10-CM | POA: Diagnosis not present

## 2015-10-15 DIAGNOSIS — L821 Other seborrheic keratosis: Secondary | ICD-10-CM | POA: Diagnosis not present

## 2015-10-15 DIAGNOSIS — C44319 Basal cell carcinoma of skin of other parts of face: Secondary | ICD-10-CM | POA: Diagnosis not present

## 2015-10-15 DIAGNOSIS — D692 Other nonthrombocytopenic purpura: Secondary | ICD-10-CM | POA: Diagnosis not present

## 2015-10-15 DIAGNOSIS — L570 Actinic keratosis: Secondary | ICD-10-CM | POA: Diagnosis not present

## 2015-10-15 DIAGNOSIS — D2371 Other benign neoplasm of skin of right lower limb, including hip: Secondary | ICD-10-CM | POA: Diagnosis not present

## 2015-10-15 DIAGNOSIS — Z85828 Personal history of other malignant neoplasm of skin: Secondary | ICD-10-CM | POA: Diagnosis not present

## 2015-10-15 DIAGNOSIS — D1801 Hemangioma of skin and subcutaneous tissue: Secondary | ICD-10-CM | POA: Diagnosis not present

## 2015-10-25 ENCOUNTER — Ambulatory Visit: Payer: Self-pay | Admitting: Pharmacist Clinician (PhC)/ Clinical Pharmacy Specialist

## 2015-10-31 ENCOUNTER — Ambulatory Visit (INDEPENDENT_AMBULATORY_CARE_PROVIDER_SITE_OTHER): Payer: Medicare Other

## 2015-10-31 DIAGNOSIS — E538 Deficiency of other specified B group vitamins: Secondary | ICD-10-CM

## 2015-10-31 NOTE — Progress Notes (Signed)
   Subjective:    Patient ID: Mark Jackson., male    DOB: 14-Jun-1930, 82 y.o.   MRN: [id number]  HPI    Review of Systems     Objective:   Physical Exam        Assessment & Plan:  Pt here for B12 injection that he gets every 30 days.

## 2015-12-04 ENCOUNTER — Ambulatory Visit: Payer: Medicare Other

## 2015-12-04 DIAGNOSIS — E538 Deficiency of other specified B group vitamins: Secondary | ICD-10-CM

## 2015-12-04 MED ORDER — CYANOCOBALAMIN 1000 MCG/ML IJ SOLN
1000.0000 ug | Freq: Once | INTRAMUSCULAR | Status: AC
  Administered 2015-12-04: 1000 ug via INTRAMUSCULAR

## 2015-12-17 ENCOUNTER — Ambulatory Visit (INDEPENDENT_AMBULATORY_CARE_PROVIDER_SITE_OTHER): Payer: Medicare Other | Admitting: Cardiology

## 2015-12-17 ENCOUNTER — Encounter: Payer: Self-pay | Admitting: Cardiology

## 2015-12-17 VITALS — BP 148/88 | HR 53 | Ht 69.0 in | Wt 191.0 lb

## 2015-12-17 DIAGNOSIS — Z9861 Coronary angioplasty status: Secondary | ICD-10-CM

## 2015-12-17 DIAGNOSIS — N183 Chronic kidney disease, stage 3 unspecified: Secondary | ICD-10-CM

## 2015-12-17 DIAGNOSIS — I5032 Chronic diastolic (congestive) heart failure: Secondary | ICD-10-CM | POA: Diagnosis not present

## 2015-12-17 DIAGNOSIS — I480 Paroxysmal atrial fibrillation: Secondary | ICD-10-CM

## 2015-12-17 DIAGNOSIS — E669 Obesity, unspecified: Secondary | ICD-10-CM

## 2015-12-17 DIAGNOSIS — I2510 Atherosclerotic heart disease of native coronary artery without angina pectoris: Secondary | ICD-10-CM | POA: Diagnosis not present

## 2015-12-17 DIAGNOSIS — I10 Essential (primary) hypertension: Secondary | ICD-10-CM

## 2015-12-17 DIAGNOSIS — I350 Nonrheumatic aortic (valve) stenosis: Secondary | ICD-10-CM | POA: Diagnosis not present

## 2015-12-17 DIAGNOSIS — E785 Hyperlipidemia, unspecified: Secondary | ICD-10-CM

## 2015-12-17 NOTE — Patient Instructions (Addendum)
Stop  TAKING Metoprolol   Take your furosemide ( lasix ) every Wednesday - DOUBLE THE DOSE  IF YOU HAVE INCREASE SWELLING YOU MAY TAKE A LASIX THAT DAY IF NEEDED  NO OTHER CHANGES   Your physician wants you to follow-up in [place] 2017 WITH DR HARDING--30 MIN. You will receive a reminder letter in the mail two months in advance. If you don't receive a letter, please call our office to schedule the follow-up appointment.
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
## 2015-12-23 ENCOUNTER — Other Ambulatory Visit: Payer: Self-pay | Admitting: Cardiovascular Disease

## 2015-12-24 NOTE — Telephone Encounter (Signed)
Rx(s) sent to pharmacy electronically.  

## 2015-12-25 ENCOUNTER — Encounter: Payer: Self-pay | Admitting: Cardiology

## 2015-12-25 NOTE — Assessment & Plan Note (Signed)
Continues to do well post PCI. Because of Plavix allergy would limited to Brilinta/Effient as options. Since he will need to be on full and regulation long-term, my plan would be to complete 1 year of Brilinta, and then probably return to aspirin plus warfarin. On combination of diltiazem and metoprolol for rate control and coronary disease. He is also on low-dose Crestor- was unable to tolerate higher doses of other statins.

## 2015-12-25 NOTE — Assessment & Plan Note (Signed)
He has not had any labs checked that it been sent to me recently. He is on Crestor.

## 2015-12-25 NOTE — Assessment & Plan Note (Addendum)
Doesn't sound he's had any obvious evidence of recurrence. He is on both beta blocker and diltiazem. He is starting available fatigue and dyspnea. I would like to see which one either the diltiazem or calcium channel blocker is better for keeping his rate under control and then not making him as tired. Since he is only taking half a dose of the Toprol, probably easiest to stop that This patients CHA2DS2-VASc Score and unadjusted Ischemic Stroke Rate (% per year) is equal to 4.8 % stroke rate/year from a score of 4 -- we will need to discuss going back to full anticoagulation at follow-up visit. Would probably consider ELIQUIS

## 2015-12-25 NOTE — Assessment & Plan Note (Addendum)
On stable low-dose standing Lasix. He has not had use any additional. He does occasionally have a little more dyspnea and maybe a little bit orthopnea with some edema. I think is probably prudent for him to choose one day out of the week such as Wednesday when he would take a full 40 mg of Lasix. Other days he would only do it additionally as needed.

## 2015-12-25 NOTE — Assessment & Plan Note (Signed)
Mild aortic stenosis. Will be due for follow-up echocardiogram probably in January timeframe
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
## 2015-12-25 NOTE — Assessment & Plan Note (Signed)
Borderline pressures today. As a back off on his metoprolol, we may end up having room to use an ARB

## 2015-12-25 NOTE — Assessment & Plan Note (Signed)
The patient understands the need to lose weight with diet and exercise. We have discussed specific strategies for this.  

## 2016-01-01 ENCOUNTER — Ambulatory Visit (INDEPENDENT_AMBULATORY_CARE_PROVIDER_SITE_OTHER): Payer: Medicare Other | Admitting: Emergency Medicine

## 2016-01-01 DIAGNOSIS — E538 Deficiency of other specified B group vitamins: Secondary | ICD-10-CM

## 2016-01-01 DIAGNOSIS — I2510 Atherosclerotic heart disease of native coronary artery without angina pectoris: Secondary | ICD-10-CM

## 2016-01-07 DIAGNOSIS — G43009 Migraine without aura, not intractable, without status migrainosus: Secondary | ICD-10-CM | POA: Diagnosis not present

## 2016-01-09 NOTE — Progress Notes (Signed)
Patient seen for injection only he was not evaluated by a physician or examine.

## 2016-01-15 DIAGNOSIS — N183 Chronic kidney disease, stage 3 (moderate): Secondary | ICD-10-CM | POA: Diagnosis not present

## 2016-01-15 DIAGNOSIS — D631 Anemia in chronic kidney disease: Secondary | ICD-10-CM | POA: Diagnosis not present

## 2016-01-30 ENCOUNTER — Ambulatory Visit (INDEPENDENT_AMBULATORY_CARE_PROVIDER_SITE_OTHER): Payer: Medicare Other | Admitting: Emergency Medicine

## 2016-01-30 DIAGNOSIS — E538 Deficiency of other specified B group vitamins: Secondary | ICD-10-CM

## 2016-01-31 NOTE — Progress Notes (Signed)
Patient was seen for a B12 injection.

## 2016-02-08 DIAGNOSIS — S098XXA Other specified injuries of head, initial encounter: Secondary | ICD-10-CM | POA: Diagnosis not present

## 2016-02-08 DIAGNOSIS — W0110XA Fall on same level from slipping, tripping and stumbling with subsequent striking against unspecified object, initial encounter: Secondary | ICD-10-CM | POA: Diagnosis not present

## 2016-02-08 DIAGNOSIS — T148 Other injury of unspecified body region: Secondary | ICD-10-CM | POA: Diagnosis not present

## 2016-02-13 ENCOUNTER — Encounter: Payer: Self-pay | Admitting: Emergency Medicine

## 2016-02-13 ENCOUNTER — Ambulatory Visit (INDEPENDENT_AMBULATORY_CARE_PROVIDER_SITE_OTHER): Payer: Medicare Other | Admitting: Emergency Medicine

## 2016-02-13 VITALS — BP 116/68 | HR 78 | Temp 98.5°F | Resp 18 | Ht 69.0 in | Wt 189.2 lb

## 2016-02-13 DIAGNOSIS — I2510 Atherosclerotic heart disease of native coronary artery without angina pectoris: Secondary | ICD-10-CM | POA: Diagnosis not present

## 2016-02-13 DIAGNOSIS — S51801A Unspecified open wound of right forearm, initial encounter: Secondary | ICD-10-CM | POA: Diagnosis not present

## 2016-02-13 DIAGNOSIS — I4891 Unspecified atrial fibrillation: Secondary | ICD-10-CM

## 2016-02-13 MED ORDER — MUPIROCIN 2 % EX OINT: TOPICAL_OINTMENT | CUTANEOUS | Status: DC
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
## 2016-02-13 NOTE — Patient Instructions (Signed)
     IF you received an x-ray today, you will receive an invoice from [facility]. Please contact [facility] at [phone] with questions or concerns regarding your invoice.   IF you received labwork today, you will receive an invoice from [facility]. Please contact [facility] at [phone] with questions or concerns regarding your invoice.   Our billing staff will not be able to assist you with questions regarding bills from these companies.  You will be contacted with the lab results as soon as they are available. The fastest way to get your results is to activate your My Chart account. Instructions are located on the last page of this paperwork. If you have not heard from us regarding the results in 2 weeks, please contact this office.      

## 2016-02-17 LAB — WOUND CULTURE
GRAM STAIN: NONE SEEN
GRAM STAIN: NONE SEEN
ORGANISM ID, BACTERIA: NORMAL

## 2016-03-02 ENCOUNTER — Other Ambulatory Visit: Payer: Self-pay | Admitting: Cardiology

## 2016-03-03 ENCOUNTER — Telehealth: Payer: Self-pay

## 2016-03-03 ENCOUNTER — Ambulatory Visit (INDEPENDENT_AMBULATORY_CARE_PROVIDER_SITE_OTHER): Payer: Medicare Other | Admitting: Physician Assistant

## 2016-03-03 DIAGNOSIS — Z8639 Personal history of other endocrine, nutritional and metabolic disease: Secondary | ICD-10-CM

## 2016-03-03 DIAGNOSIS — E538 Deficiency of other specified B group vitamins: Secondary | ICD-10-CM

## 2016-03-03 DIAGNOSIS — D7589 Other specified diseases of blood and blood-forming organs: Secondary | ICD-10-CM

## 2016-03-03 NOTE — Telephone Encounter (Signed)
Dr. Everlene Farrier [name] wants you to put in a standing order for his B-12 shot that will last until February when he can get in with his new provider. Thank you

## 2016-03-04 NOTE — Progress Notes (Signed)
Immunizations only encounter.  I did not speak with this patient. Philis Fendt, MS, PA-C 9:23 AM, 03/04/2016

## 2016-03-05 NOTE — Telephone Encounter (Signed)
Please go ahead  And place the order  that he receive 1 mL B12 every month.  Put this order in for 1 year.

## 2016-03-10 NOTE — Telephone Encounter (Signed)
error 

## 2016-03-13 ENCOUNTER — Other Ambulatory Visit: Payer: Self-pay | Admitting: *Deleted

## 2016-03-13 ENCOUNTER — Telehealth: Payer: Self-pay | Admitting: *Deleted

## 2016-03-13 DIAGNOSIS — E538 Deficiency of other specified B group vitamins: Secondary | ICD-10-CM

## 2016-03-13 MED ORDER — CYANOCOBALAMIN 1000 MCG/ML IJ SOLN
1000.0000 ug | INTRAMUSCULAR | Status: DC
  Administered 2016-03-31: 1000 ug via INTRAMUSCULAR

## 2016-03-13 NOTE — Telephone Encounter (Signed)
Dr Everlene Farrier gave an order for B-12 injections 28ml every month until 04/03/17

## 2016-03-31 ENCOUNTER — Ambulatory Visit (INDEPENDENT_AMBULATORY_CARE_PROVIDER_SITE_OTHER): Payer: Medicare Other | Admitting: Physician Assistant

## 2016-03-31 DIAGNOSIS — E538 Deficiency of other specified B group vitamins: Secondary | ICD-10-CM | POA: Diagnosis not present

## 2016-03-31 DIAGNOSIS — I2510 Atherosclerotic heart disease of native coronary artery without angina pectoris: Secondary | ICD-10-CM | POA: Diagnosis not present

## 2016-03-31 NOTE — Progress Notes (Signed)
Pt was here for his monthly B12 injection - this was a nurse only visit.

## 2016-04-02 ENCOUNTER — Other Ambulatory Visit: Payer: Self-pay | Admitting: Cardiology

## 2016-04-02 NOTE — Telephone Encounter (Signed)
REFILL 

## 2016-04-21 ENCOUNTER — Encounter: Payer: Self-pay | Admitting: Cardiology

## 2016-04-21 ENCOUNTER — Ambulatory Visit (INDEPENDENT_AMBULATORY_CARE_PROVIDER_SITE_OTHER): Payer: Medicare Other | Admitting: Cardiology

## 2016-04-21 VITALS — BP 122/71 | HR 78 | Ht 70.0 in | Wt 191.4 lb

## 2016-04-21 DIAGNOSIS — E785 Hyperlipidemia, unspecified: Secondary | ICD-10-CM

## 2016-04-21 DIAGNOSIS — Z9861 Coronary angioplasty status: Secondary | ICD-10-CM

## 2016-04-21 DIAGNOSIS — I5032 Chronic diastolic (congestive) heart failure: Secondary | ICD-10-CM

## 2016-04-21 DIAGNOSIS — I10 Essential (primary) hypertension: Secondary | ICD-10-CM | POA: Diagnosis not present

## 2016-04-21 DIAGNOSIS — I779 Disorder of arteries and arterioles, unspecified: Secondary | ICD-10-CM

## 2016-04-21 DIAGNOSIS — I480 Paroxysmal atrial fibrillation: Secondary | ICD-10-CM

## 2016-04-21 DIAGNOSIS — K625 Hemorrhage of anus and rectum: Secondary | ICD-10-CM

## 2016-04-21 DIAGNOSIS — I350 Nonrheumatic aortic (valve) stenosis: Secondary | ICD-10-CM

## 2016-04-21 DIAGNOSIS — I2510 Atherosclerotic heart disease of native coronary artery without angina pectoris: Secondary | ICD-10-CM

## 2016-04-21 DIAGNOSIS — E669 Obesity, unspecified: Secondary | ICD-10-CM

## 2016-04-21 DIAGNOSIS — Z8679 Personal history of other diseases of the circulatory system: Secondary | ICD-10-CM

## 2016-04-21 DIAGNOSIS — I739 Peripheral vascular disease, unspecified: Secondary | ICD-10-CM

## 2016-04-21 DIAGNOSIS — I6523 Occlusion and stenosis of bilateral carotid arteries: Secondary | ICD-10-CM | POA: Diagnosis not present

## 2016-04-21 DIAGNOSIS — Z7901 Long term (current) use of anticoagulants: Secondary | ICD-10-CM

## 2016-04-21 MED ORDER — APIXABAN 5 MG PO TABS: 5.0000 mg | ORAL_TABLET | Freq: Two times a day (BID) | ORAL | 6 refills | Status: DC
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
## 2016-04-21 NOTE — Patient Instructions (Signed)
Medications:  Complete your current dose of Brilinta and stop.   Start Eliquis 5 mg twice daily.   Testing/Procedures:  Your physician has requested that you have a carotid duplex. This test is an ultrasound of the carotid arteries in your neck. It looks at blood flow through these arteries that supply the brain with blood. Allow one hour for this exam. There are no restrictions or special instructions.   Follow-Up:  Your physician wants you to follow-up in: 6 months with Dr. Ellyn Hack. You will receive a reminder letter in the mail two months in advance. If you don't receive a letter, please call our office to schedule the follow-up appointment.  If you need a refill on your cardiac medications before your next appointment, please call your pharmacy.

## 2016-04-22 ENCOUNTER — Other Ambulatory Visit: Payer: Self-pay | Admitting: Cardiology

## 2016-04-22 DIAGNOSIS — I6523 Occlusion and stenosis of bilateral carotid arteries: Secondary | ICD-10-CM

## 2016-04-23 ENCOUNTER — Other Ambulatory Visit: Payer: Self-pay

## 2016-04-23 ENCOUNTER — Encounter: Payer: Self-pay | Admitting: Cardiology

## 2016-04-23 MED ORDER — APIXABAN 5 MG PO TABS: 5.0000 mg | ORAL_TABLET | Freq: Two times a day (BID) | ORAL | 6 refills | Status: DC

## 2016-04-23 NOTE — Assessment & Plan Note (Signed)
Really no active heart failure symptoms at this point. He does have some mild edema and mild exertional dyspnea from his diastolic dysfunction, but is exacerbated if he does have an A. fib recurrence. On stable low-dose Lasix with when necessary dosing. On stable dose of diltiazem which seem to work better for rate control. Not requiring afterload reduction at this time.

## 2016-04-23 NOTE — Assessment & Plan Note (Signed)
Remains on Crestor that he is tolerating relatively well. Should be due for labs by his PCP soon.

## 2016-04-23 NOTE — Assessment & Plan Note (Signed)
No recurrent anginal symptoms. Continues to remain active. Not requiring any nitroglycerin for now, but we'll continue to provide prescription for sublingual nitroglycerin when necessary.

## 2016-04-23 NOTE — Assessment & Plan Note (Signed)
The patient understands the need to lose weight with diet and exercise. We have discussed specific strategies for this.  

## 2016-04-23 NOTE — Assessment & Plan Note (Signed)
Well-controlled blood pressures on low-dose diltiazem only. To avoid orthostatic dizziness, I would not further titrate her adequately reduction agents. We stopped his beta blocker due to fatigue.

## 2016-04-23 NOTE — Assessment & Plan Note (Signed)
He does have some moderate right internal carotid disease that was in the 70-80% range. Due for routine follow-up Doppler ultrasound that is monitored by Dr. Gwenlyn Found. He should be due to see Dr. Gwenlyn Found back in follow-up this year.

## 2016-04-23 NOTE — Assessment & Plan Note (Addendum)
He did remarkably well post PCI in the setting of unstable angina. No recurrent anginal symptoms. He is now beyond 1 year status post PCI. At this point, as previously discussed, we are probably safe switching from Brilinta to Eliquis based on his A. fib risk score. No longer on aspirin either. Continues to be on diltiazem for rate controlling CAD. We stopped his metoprolol due to fatigue. Within the relatively normal EF this is acceptable. On low-dose Crestor that he seems to be tolerating relatively well.  Plan: DC Brilinta. Continue  diltiazem and Crestor.

## 2016-04-23 NOTE — Assessment & Plan Note (Addendum)
Far as I can tell, he is not had any recurrent symptoms to suggest recurrence of A. fib. Remains on combination diltiazem for rate control.  Based on his CHA2DS2Vasc Score of 4, and the fact he is now one year out from his stent, I think were fine switching from Brilinta to Eliquis.

## 2016-04-23 NOTE — Assessment & Plan Note (Addendum)
Only mild AS based on now to follow-up echocardiogram evaluations. Would probably not need to follow-up again for least 2 years

## 2016-04-24 ENCOUNTER — Ambulatory Visit (HOSPITAL_COMMUNITY)
Admission: RE | Admit: 2016-04-24 | Discharge: 2016-04-24 | Disposition: A | Discharge status: Home / Self Care | Payer: Medicare Other | Source: Ambulatory Visit | Attending: Cardiology | Admitting: Cardiology

## 2016-04-24 DIAGNOSIS — I6523 Occlusion and stenosis of bilateral carotid arteries: Secondary | ICD-10-CM | POA: Diagnosis not present

## 2016-05-01 ENCOUNTER — Encounter: Payer: Self-pay | Admitting: *Deleted

## 2016-05-01 ENCOUNTER — Telehealth: Payer: Self-pay | Admitting: *Deleted

## 2016-05-01 ENCOUNTER — Ambulatory Visit (INDEPENDENT_AMBULATORY_CARE_PROVIDER_SITE_OTHER): Payer: Medicare Other | Admitting: Physician Assistant

## 2016-05-01 DIAGNOSIS — E538 Deficiency of other specified B group vitamins: Secondary | ICD-10-CM

## 2016-05-01 NOTE — Telephone Encounter (Signed)
Open error 

## 2016-05-01 NOTE — Progress Notes (Signed)
Patient was here for a vitamin b 12 injection. Patient received injection in left deltoid. Pt. Tolerated injection well.

## 2016-05-05 NOTE — Telephone Encounter (Signed)
LEFT  DETAIL MESSAGE- PER DR HARDING ONCE COMPLETED BOTTLE OF BRILITINA DISCONTINUE AND START ELIQUIS 5 MG  ONE TABLET TWICE A DAY   ELIQUIS HAS BEEN E-SENT TO PHARMACY AND AWAITING FOR PATIENT TO NOTIFY  THE PHARMACY TO REFILL.

## 2016-05-05 NOTE — Telephone Encounter (Signed)
LEFT MESSAGE TO CALL BACK . PATIENT HAD A QUESTION FOR DR HARDING  CONCERNING ONE OF HIS MEDICATIONS

## 2016-05-05 NOTE — Telephone Encounter (Signed)
Mr. Caldron needs Ivin Booty to call -  states Dr. Ellyn Hack was going to put him on Eliquis recently "pharmacist went back and questioned whether he really wanted to do that, left patient on Brilinta" Patient currently on Brilinta - does he need to make the med change?  there is something in a results note about this but I don't have any further information for patient and informed him I would need Ivin Booty to look at this and call him back.  He can be reached today on cell at [phone]

## 2016-05-13 IMAGING — DX DG CHEST 1V PORT
1 series · 1 of 1 positions shown · non-contrast
Comparison: Portable exam 8182 hours compared 03/13/2015

CLINICAL DATA: Chest pain this morning, history essential
hypertension, LEFT ventricular diastolic dysfunction, obesity,
atrial fibrillation, prostate cancer, stage 3 chronic kidney disease

EXAM:
PORTABLE CHEST - 1 VIEW

[chest ap]
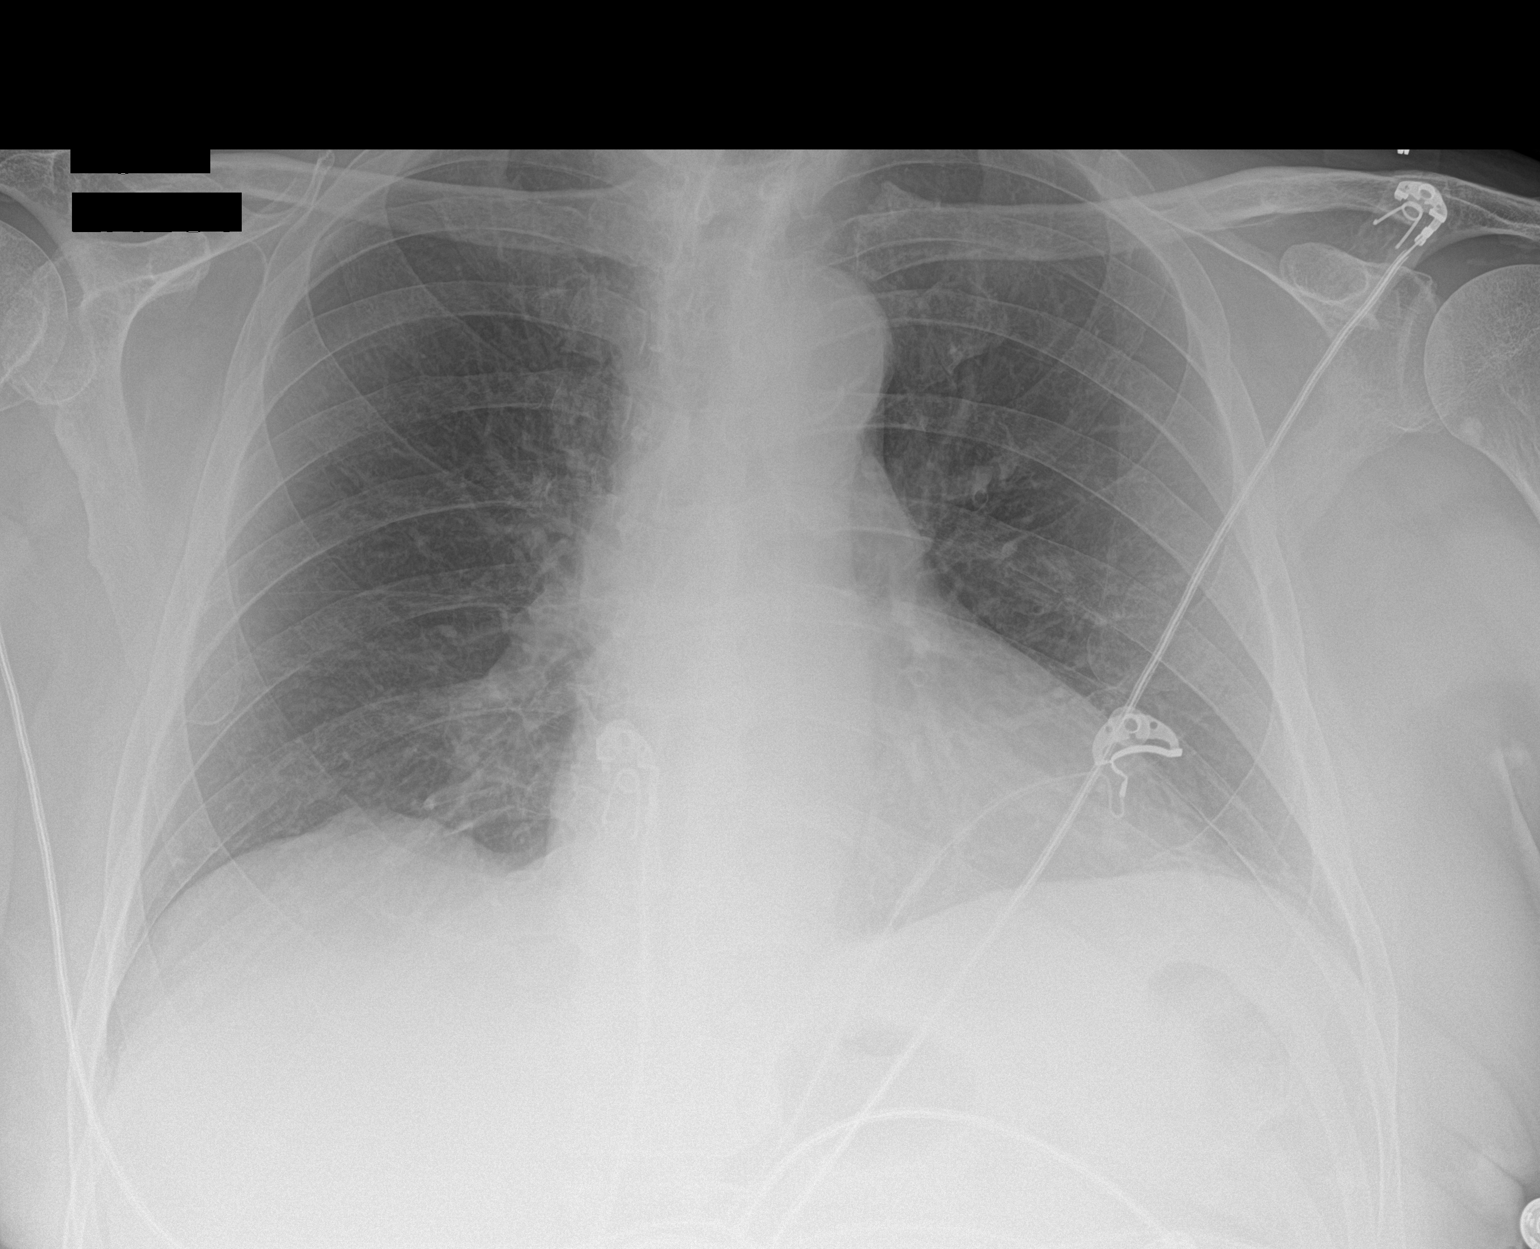

[1 of 1 positions shown; findings below may reference images not displayed]

FINDINGS: Upper normal heart size, mediastinal contours and pulmonary
vascularity.

Atherosclerotic calcification aorta.

Lungs clear.

No pleural effusion or pneumothorax.

Bones demineralized.
IMPRESSION: No acute abnormalities.

## 2016-05-19 DIAGNOSIS — M5136 Other intervertebral disc degeneration, lumbar region: Secondary | ICD-10-CM | POA: Diagnosis not present

## 2016-05-26 ENCOUNTER — Ambulatory Visit (INDEPENDENT_AMBULATORY_CARE_PROVIDER_SITE_OTHER): Payer: Medicare Other | Admitting: Family Medicine

## 2016-05-26 DIAGNOSIS — Z23 Encounter for immunization: Secondary | ICD-10-CM | POA: Diagnosis not present

## 2016-05-26 DIAGNOSIS — E538 Deficiency of other specified B group vitamins: Secondary | ICD-10-CM

## 2016-05-26 MED ORDER — CYANOCOBALAMIN 1000 MCG/ML IJ SOLN
1000.0000 ug | INTRAMUSCULAR | Status: DC
  Administered 2016-05-26: 1000 ug via INTRAMUSCULAR

## 2016-05-26 NOTE — Progress Notes (Signed)
Here for B12 shot and flu shot only.

## 2016-06-01 ENCOUNTER — Other Ambulatory Visit: Payer: Self-pay | Admitting: Cardiology

## 2016-06-01 ENCOUNTER — Ambulatory Visit (INDEPENDENT_AMBULATORY_CARE_PROVIDER_SITE_OTHER): Payer: Medicare Other | Admitting: Cardiology

## 2016-06-01 ENCOUNTER — Encounter: Payer: Self-pay | Admitting: Cardiology

## 2016-06-01 VITALS — BP 129/75 | HR 60 | Ht 68.0 in | Wt 189.4 lb

## 2016-06-01 DIAGNOSIS — I200 Unstable angina: Secondary | ICD-10-CM | POA: Diagnosis present

## 2016-06-01 DIAGNOSIS — R9431 Abnormal electrocardiogram [ECG] [EKG]: Secondary | ICD-10-CM | POA: Diagnosis not present

## 2016-06-01 DIAGNOSIS — D689 Coagulation defect, unspecified: Secondary | ICD-10-CM | POA: Insufficient documentation

## 2016-06-01 DIAGNOSIS — E785 Hyperlipidemia, unspecified: Secondary | ICD-10-CM

## 2016-06-01 DIAGNOSIS — I480 Paroxysmal atrial fibrillation: Secondary | ICD-10-CM

## 2016-06-01 DIAGNOSIS — I2510 Atherosclerotic heart disease of native coronary artery without angina pectoris: Secondary | ICD-10-CM

## 2016-06-01 DIAGNOSIS — I10 Essential (primary) hypertension: Secondary | ICD-10-CM

## 2016-06-01 DIAGNOSIS — Z01818 Encounter for other preprocedural examination: Secondary | ICD-10-CM | POA: Insufficient documentation

## 2016-06-01 DIAGNOSIS — I5032 Chronic diastolic (congestive) heart failure: Secondary | ICD-10-CM

## 2016-06-01 DIAGNOSIS — Z9861 Coronary angioplasty status: Secondary | ICD-10-CM

## 2016-06-01 NOTE — Patient Instructions (Signed)
Medication Instructions:  NO CHANGES  Labwork: DO LABS TODAY BMP, CBC,PT,PTT.  Testing/Procedures: SCHEDULE THIS Thursday-RIGHT RADIAL WITH DR [facility]  Your physician has requested that you have a cardiac catheterization. Cardiac catheterization is used to diagnose and/or treat various heart conditions. Doctors may recommend this procedure for a number of different reasons. The most common reason is to evaluate chest pain. Chest pain can be a symptom of coronary artery disease (CAD), and cardiac catheterization can show whether plaque is narrowing or blocking your heart's arteries. This procedure is also used to evaluate the valves, as well as measure the blood flow and oxygen levels in different parts of your heart. For further information please visit [email or web address]. Please follow instruction sheet, as given.   Follow-Up: AFTER PROCEDURE IN 2-3 WEEKS WITH DR HARDING-30 MIN   If you need a refill on your cardiac medications before your next appointment, please call your pharmacy.

## 2016-06-01 NOTE — Assessment & Plan Note (Signed)
Lateral T-wave inversions more prominent now in setting of recurrent chest pain. Would consider the chest pain to be anginal until proven otherwise. Plan: Crit catheterization.

## 2016-06-01 NOTE — Assessment & Plan Note (Addendum)
No recurrence. On diltiazem for rate control and antianginal effect. No bleeding on Eliquis. Were now identified about if we need to actually do double therapy or not pending his cardiac catheterization. Hold Eliquis 2 days before cardiac catheterization.
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
## 2016-06-01 NOTE — Assessment & Plan Note (Addendum)
New onset substernal chest discomfort associated with exertion and relieved by rest. This is in a patient with known LAD disease. He is just that about a year out from his PCI and we have switched him from antiplatelet to Eliquis for anticoagulation. Also noted is a more prominent T-wave inversions in lateral leads. Cannot exclude ischemia.  Just potential options of stress test versus cardiac cath. I'm convinced that his symptoms are more consistent with angina and feeling the best course of action would be to proceed with cardiac catheterization. He agrees with this plan.  If I need to do PCI, I would probably use Plavix with aspirin for only one month (potentially holding Eliquis during that period, then stop aspirin after one month and restart Eliquis with Plavix. We hope to not do that, but if he does need revascularization, I think were stuck.  I told him to use sublingual nitroglycerin as needed until a do the cath. If no culprit lesion is found, may need to consider Imdur.  --Hold Brilinta 2 days prior to cath.

## 2016-06-01 NOTE — Assessment & Plan Note (Signed)
On stable dose of Lasix. Continue current dose with once a week increase Lasix. Also for when necessary edema.

## 2016-06-01 NOTE — Assessment & Plan Note (Signed)
Stable - no change to meds.  No BB due to fatigue.

## 2016-06-01 NOTE — Assessment & Plan Note (Addendum)
We had just converted from [place]. Now is having recurrent symptoms concerning for unstable angina/crescendo angina. Plan: Cardiac catheterization plus minus PCI. Was intolerant of beta blockers, continue with diltiazem. Continue Crestor low-dose.

## 2016-06-01 NOTE — Assessment & Plan Note (Signed)
On Crestor. Is followed by PCP.

## 2016-06-02 LAB — CBC
HCT: 36.1 % — ABNORMAL LOW (ref 38.5–50.0)
HEMOGLOBIN: 11.6 g/dL — AB (ref 13.2–17.1)
MCH: 31.9 pg (ref 27.0–33.0)
MCHC: 32.1 g/dL (ref 32.0–36.0)
MCV: 99.2 fL (ref 80.0–100.0)
MPV: 9.9 fL (ref 7.5–12.5)
PLATELETS: 239 10*3/uL (ref 140–400)
RBC: 3.64 MIL/uL — AB (ref 4.20–5.80)
RDW: 13.9 % (ref 11.0–15.0)
WBC: 10.2 10*3/uL (ref 3.8–10.8)

## 2016-06-02 LAB — BASIC METABOLIC PANEL
BUN: 34 mg/dL — ABNORMAL HIGH (ref 7–25)
CALCIUM: 8.6 mg/dL (ref 8.6–10.3)
CHLORIDE: 109 mmol/L (ref 98–110)
CO2: 22 mmol/L (ref 20–31)
Creat: 2.12 mg/dL — ABNORMAL HIGH (ref 0.70–1.11)
GLUCOSE: 89 mg/dL (ref 65–99)
Potassium: 5.1 mmol/L (ref 3.5–5.3)
SODIUM: 142 mmol/L (ref 135–146)

## 2016-06-02 LAB — PROTIME-INR
INR: 1
Prothrombin Time: 11.1 s (ref 9.0–11.5)

## 2016-06-02 LAB — APTT: APTT: 34 s (ref 22–34)

## 2016-06-02 NOTE — Telephone Encounter (Signed)
Rx(s) sent to pharmacy electronically.  

## 2016-06-04 ENCOUNTER — Other Ambulatory Visit: Payer: Self-pay | Admitting: *Deleted

## 2016-06-04 ENCOUNTER — Telehealth: Payer: Self-pay | Admitting: *Deleted

## 2016-06-04 NOTE — Telephone Encounter (Signed)
Patient aware of lab results- will be having lab redrawn tomorrow prior to cath

## 2016-06-04 NOTE — Telephone Encounter (Signed)
-----   Message from [name], MD sent at 06/03/2016  5:27 PM EDT ----- Chemistry panel for pre-cath shows with looks like mild dehydration. Would recommend increasing fluid intake over the next few days until we have heart catheterization.  We will need to recheck BMP panel prior to his On Friday -- we'll have to increase precath fluids.  [name], M.D., M.S. Interventional Cardiologist   Pager # [phone] Phone # [phone] [address] [place], [place] [address]

## 2016-06-05 ENCOUNTER — Telehealth: Payer: Self-pay | Admitting: *Deleted

## 2016-06-05 ENCOUNTER — Encounter (HOSPITAL_COMMUNITY)
Admission: RE | Disposition: A | Discharge status: Home / Self Care | Payer: Self-pay | Source: Ambulatory Visit | Attending: Cardiology

## 2016-06-05 ENCOUNTER — Telehealth: Payer: Self-pay | Admitting: Cardiology

## 2016-06-05 ENCOUNTER — Ambulatory Visit (HOSPITAL_COMMUNITY)
Admission: RE | Admit: 2016-06-05 | Discharge: 2016-06-05 | Disposition: A | Discharge status: Home / Self Care | Payer: Medicare Other | Source: Ambulatory Visit | Attending: Cardiology | Admitting: Cardiology

## 2016-06-05 DIAGNOSIS — R944 Abnormal results of kidney function studies: Secondary | ICD-10-CM | POA: Diagnosis not present

## 2016-06-05 DIAGNOSIS — I10 Essential (primary) hypertension: Secondary | ICD-10-CM

## 2016-06-05 DIAGNOSIS — Z79899 Other long term (current) drug therapy: Secondary | ICD-10-CM

## 2016-06-05 DIAGNOSIS — I5032 Chronic diastolic (congestive) heart failure: Secondary | ICD-10-CM

## 2016-06-05 DIAGNOSIS — R7989 Other specified abnormal findings of blood chemistry: Secondary | ICD-10-CM

## 2016-06-05 DIAGNOSIS — Z01818 Encounter for other preprocedural examination: Secondary | ICD-10-CM

## 2016-06-05 DIAGNOSIS — Z5309 Procedure and treatment not carried out because of other contraindication: Secondary | ICD-10-CM | POA: Insufficient documentation

## 2016-06-05 DIAGNOSIS — D689 Coagulation defect, unspecified: Secondary | ICD-10-CM

## 2016-06-05 DIAGNOSIS — I200 Unstable angina: Secondary | ICD-10-CM

## 2016-06-05 DIAGNOSIS — I480 Paroxysmal atrial fibrillation: Secondary | ICD-10-CM

## 2016-06-05 LAB — BASIC METABOLIC PANEL
Anion gap: 9 (ref 5–15)
BUN: 44 mg/dL — ABNORMAL HIGH (ref 6–20)
CHLORIDE: 112 mmol/L — AB (ref 101–111)
CO2: 20 mmol/L — ABNORMAL LOW (ref 22–32)
Calcium: 8.6 mg/dL — ABNORMAL LOW (ref 8.9–10.3)
Creatinine, Ser: 2.19 mg/dL — ABNORMAL HIGH (ref 0.61–1.24)
GFR calc non Af Amer: 26 mL/min — ABNORMAL LOW (ref >60)
GFR, EST AFRICAN AMERICAN: 30 mL/min — AB (ref >60)
Glucose, Bld: 93 mg/dL (ref 65–99)
POTASSIUM: 4.2 mmol/L (ref 3.5–5.1)
SODIUM: 141 mmol/L (ref 135–145)

## 2016-06-05 SURGERY — LEFT HEART CATH AND CORONARY ANGIOGRAPHY
Anesthesia: LOCAL

## 2016-06-05 MED ORDER — SODIUM CHLORIDE 0.9 % IV SOLN
INTRAVENOUS | Status: DC
  Administered 2016-06-05: 07:00:00 via INTRAVENOUS

## 2016-06-05 MED ORDER — SODIUM CHLORIDE 0.9% FLUSH: 3.0000 mL | INTRAVENOUS | Status: DC | PRN

## 2016-06-05 NOTE — Progress Notes (Signed)
Case canceled for today. Instructed to push fluids this weekend, redraw labs next Monday and return next Tuesday for procedure with stated understanding. Discharged ambulatory with daughter.

## 2016-06-05 NOTE — Telephone Encounter (Signed)
-----   Message from [name], MD sent at 06/05/2016  7:42 AM EDT ----- Regarding: Mr. Dumond - Creatinine Mr. Rao came in for CAth today, but his Cr was still ~2.19.  We have post-poned his cath until next Tuesday. I have asked him to drink extra fluids over the weekend & hold Lasix.  He should take his Eliquis tonite, Saturday & Sunday  Morning.  He will need recheck of BMP on Monday.  Thanks.  Ward

## 2016-06-05 NOTE — Telephone Encounter (Signed)
Has questions regarding info on upcoming procedure

## 2016-06-05 NOTE — Telephone Encounter (Signed)
Phone busy x3 will try again later

## 2016-06-05 NOTE — H&P (Signed)
[name] presented today for his planned cardiac catheterization. Unfortunately, his renal function had not improved after initial evaluation precath. As result, we decided the best course of action be for him to increase his by mouth intake and hold Lasix over the weekend. We will recheck labs again on Monday, and tentatively schedule cardiac catheterization on Tuesday.  He will take Eliquis tonight and then continue until Sunday evening when he will hold   [name], M.D., M.S. Interventional Cardiologist   Pager # [phone] Phone # [phone] [address]. [place], [place] [address]

## 2016-06-05 NOTE — Telephone Encounter (Signed)
Called patient. Gave him Dr Allison Quarry instructions to increase fluids, hold Lasix, and repeat blood work on Monday. He verbalized understanding to take his Eliquis tonight, tomorrow and Sunday morning.  Repeat order for BMP entered.

## 2016-06-08 ENCOUNTER — Telehealth: Payer: Self-pay | Admitting: Cardiology

## 2016-06-08 ENCOUNTER — Other Ambulatory Visit: Payer: Self-pay | Admitting: *Deleted

## 2016-06-08 DIAGNOSIS — R799 Abnormal finding of blood chemistry, unspecified: Secondary | ICD-10-CM | POA: Diagnosis not present

## 2016-06-08 DIAGNOSIS — Z5189 Encounter for other specified aftercare: Secondary | ICD-10-CM | POA: Diagnosis not present

## 2016-06-08 DIAGNOSIS — R7989 Other specified abnormal findings of blood chemistry: Secondary | ICD-10-CM | POA: Diagnosis not present

## 2016-06-08 DIAGNOSIS — Z01818 Encounter for other preprocedural examination: Secondary | ICD-10-CM | POA: Diagnosis not present

## 2016-06-08 LAB — BASIC METABOLIC PANEL
BUN: 36 mg/dL — ABNORMAL HIGH (ref 7–25)
CALCIUM: 8.4 mg/dL — AB (ref 8.6–10.3)
CO2: 22 mmol/L (ref 20–31)
Chloride: 111 mmol/L — ABNORMAL HIGH (ref 98–110)
Creat: 2.06 mg/dL — ABNORMAL HIGH (ref 0.70–1.11)
Glucose, Bld: 99 mg/dL (ref 65–99)
POTASSIUM: 4.9 mmol/L (ref 3.5–5.3)
SODIUM: 139 mmol/L (ref 135–146)

## 2016-06-08 NOTE — Telephone Encounter (Signed)
-----   Message from [name], MD sent at 06/08/2016  2:30 PM EDT ----- Kidney function looks better.  Continue to hold Furosemide for today.  Continue to hydrate.  OK for Cath tomorrow AM - but will need a couple hrs pre-cath hydration  DH

## 2016-06-08 NOTE — Telephone Encounter (Signed)
Line busy when dialed. 

## 2016-06-08 NOTE — Telephone Encounter (Signed)
Stat lab notification concerning BMET results this AM. Routed to provider to address.

## 2016-06-08 NOTE — Progress Notes (Unsigned)
Order labs stat, need result back today 06/08/16

## 2016-06-08 NOTE — Telephone Encounter (Signed)
Left message to call back  

## 2016-06-08 NOTE — Telephone Encounter (Signed)
Spoke to patient. Result given. Patient aware to be at hospital at 5:15 am by cath lab personnel .

## 2016-06-09 ENCOUNTER — Ambulatory Visit (HOSPITAL_COMMUNITY)
Admission: RE | Admit: 2016-06-09 | Discharge: 2016-06-10 | Disposition: A | Discharge status: Home / Self Care | Payer: Medicare Other | Source: Ambulatory Visit | Attending: Cardiology | Admitting: Cardiology

## 2016-06-09 ENCOUNTER — Encounter (HOSPITAL_COMMUNITY)
Admission: RE | Disposition: A | Discharge status: Home / Self Care | Payer: Self-pay | Source: Ambulatory Visit | Attending: Cardiology

## 2016-06-09 ENCOUNTER — Encounter (HOSPITAL_COMMUNITY): Payer: Self-pay | Admitting: Cardiology

## 2016-06-09 DIAGNOSIS — Z85828 Personal history of other malignant neoplasm of skin: Secondary | ICD-10-CM | POA: Diagnosis not present

## 2016-06-09 DIAGNOSIS — Y831 Surgical operation with implant of artificial internal device as the cause of abnormal reaction of the patient, or of later complication, without mention of misadventure at the time of the procedure: Secondary | ICD-10-CM | POA: Diagnosis not present

## 2016-06-09 DIAGNOSIS — Z79899 Other long term (current) drug therapy: Secondary | ICD-10-CM | POA: Insufficient documentation

## 2016-06-09 DIAGNOSIS — E785 Hyperlipidemia, unspecified: Secondary | ICD-10-CM | POA: Diagnosis not present

## 2016-06-09 DIAGNOSIS — Z8546 Personal history of malignant neoplasm of prostate: Secondary | ICD-10-CM | POA: Insufficient documentation

## 2016-06-09 DIAGNOSIS — I25118 Atherosclerotic heart disease of native coronary artery with other forms of angina pectoris: Secondary | ICD-10-CM

## 2016-06-09 DIAGNOSIS — N184 Chronic kidney disease, stage 4 (severe): Secondary | ICD-10-CM | POA: Diagnosis not present

## 2016-06-09 DIAGNOSIS — I130 Hypertensive heart and chronic kidney disease with heart failure and stage 1 through stage 4 chronic kidney disease, or unspecified chronic kidney disease: Secondary | ICD-10-CM | POA: Diagnosis not present

## 2016-06-09 DIAGNOSIS — I252 Old myocardial infarction: Secondary | ICD-10-CM | POA: Insufficient documentation

## 2016-06-09 DIAGNOSIS — I5042 Chronic combined systolic (congestive) and diastolic (congestive) heart failure: Secondary | ICD-10-CM | POA: Diagnosis not present

## 2016-06-09 DIAGNOSIS — I200 Unstable angina: Secondary | ICD-10-CM | POA: Diagnosis present

## 2016-06-09 DIAGNOSIS — I5032 Chronic diastolic (congestive) heart failure: Secondary | ICD-10-CM

## 2016-06-09 DIAGNOSIS — I25110 Atherosclerotic heart disease of native coronary artery with unstable angina pectoris: Secondary | ICD-10-CM | POA: Insufficient documentation

## 2016-06-09 DIAGNOSIS — Z7901 Long term (current) use of anticoagulants: Secondary | ICD-10-CM | POA: Diagnosis not present

## 2016-06-09 DIAGNOSIS — I480 Paroxysmal atrial fibrillation: Secondary | ICD-10-CM | POA: Diagnosis present

## 2016-06-09 DIAGNOSIS — Z87891 Personal history of nicotine dependence: Secondary | ICD-10-CM | POA: Insufficient documentation

## 2016-06-09 DIAGNOSIS — T82855A Stenosis of coronary artery stent, initial encounter: Secondary | ICD-10-CM | POA: Diagnosis not present

## 2016-06-09 DIAGNOSIS — Z955 Presence of coronary angioplasty implant and graft: Secondary | ICD-10-CM

## 2016-06-09 DIAGNOSIS — Z9861 Coronary angioplasty status: Secondary | ICD-10-CM

## 2016-06-09 DIAGNOSIS — I2510 Atherosclerotic heart disease of native coronary artery without angina pectoris: Secondary | ICD-10-CM

## 2016-06-09 DIAGNOSIS — I10 Essential (primary) hypertension: Secondary | ICD-10-CM | POA: Diagnosis present

## 2016-06-09 DIAGNOSIS — Z7902 Long term (current) use of antithrombotics/antiplatelets: Secondary | ICD-10-CM | POA: Insufficient documentation

## 2016-06-09 DIAGNOSIS — I25119 Atherosclerotic heart disease of native coronary artery with unspecified angina pectoris: Secondary | ICD-10-CM

## 2016-06-09 DIAGNOSIS — I5189 Other ill-defined heart diseases: Secondary | ICD-10-CM | POA: Diagnosis present

## 2016-06-09 HISTORY — PX: CARDIAC CATHETERIZATION: SHX172

## 2016-06-09 HISTORY — DX: Noninfective gastroenteritis and colitis, unspecified: K52.9

## 2016-06-09 LAB — POCT ACTIVATED CLOTTING TIME
ACTIVATED CLOTTING TIME: 263 s
Activated Clotting Time: 246 seconds

## 2016-06-09 SURGERY — INTRAVASCULAR PRESSURE WIRE/FFR STUDY

## 2016-06-09 MED ORDER — NITROGLYCERIN 1 MG/10 ML FOR IR/CATH LAB
INTRA_ARTERIAL | Status: AC
  Filled 2016-06-09: qty 10

## 2016-06-09 MED ORDER — ASPIRIN 81 MG PO CHEW
81.0000 mg | CHEWABLE_TABLET | Freq: Every day | ORAL | Status: DC
  Administered 2016-06-10: 81 mg via ORAL
  Filled 2016-06-09: qty 1

## 2016-06-09 MED ORDER — DILTIAZEM HCL ER COATED BEADS 120 MG PO CP24
120.0000 mg | ORAL_CAPSULE | Freq: Every day | ORAL | Status: DC
  Administered 2016-06-10: 120 mg via ORAL
  Filled 2016-06-09: qty 1

## 2016-06-09 MED ORDER — ONDANSETRON HCL 4 MG/2ML IJ SOLN: 4.0000 mg | Freq: Four times a day (QID) | INTRAMUSCULAR | Status: DC | PRN

## 2016-06-09 MED ORDER — ADENOSINE (DIAGNOSTIC) 140MCG/KG/MIN
INTRAVENOUS | Status: DC | PRN
  Administered 2016-06-09: 140 ug/kg/min via INTRAVENOUS

## 2016-06-09 MED ORDER — FENTANYL CITRATE (PF) 100 MCG/2ML IJ SOLN
INTRAMUSCULAR | Status: DC | PRN
  Administered 2016-06-09: 25 ug via INTRAVENOUS

## 2016-06-09 MED ORDER — SODIUM CHLORIDE 0.9 % WEIGHT BASED INFUSION
3.0000 mL/kg/h | INTRAVENOUS | Status: DC
  Administered 2016-06-09: 3 mL/kg/h via INTRAVENOUS

## 2016-06-09 MED ORDER — ASPIRIN 81 MG PO CHEW
81.0000 mg | CHEWABLE_TABLET | ORAL | Status: AC
Start: 2016-06-09 — End: 2016-06-09
  Administered 2016-06-09: 81 mg via ORAL

## 2016-06-09 MED ORDER — CYCLOSPORINE 0.05 % OP EMUL
1.0000 [drp] | Freq: Two times a day (BID) | OPHTHALMIC | Status: DC
  Filled 2016-06-09 (×3): qty 1

## 2016-06-09 MED ORDER — ASPIRIN 81 MG PO CHEW
CHEWABLE_TABLET | ORAL | Status: AC
  Administered 2016-06-09: 81 mg via ORAL
  Filled 2016-06-09: qty 1

## 2016-06-09 MED ORDER — IOPAMIDOL (ISOVUE-370) INJECTION 76%
INTRAVENOUS | Status: AC
  Filled 2016-06-09: qty 50

## 2016-06-09 MED ORDER — MIDAZOLAM HCL 2 MG/2ML IJ SOLN
INTRAMUSCULAR | Status: AC
  Filled 2016-06-09: qty 2

## 2016-06-09 MED ORDER — TICAGRELOR 90 MG PO TABS
ORAL_TABLET | ORAL | Status: AC
  Filled 2016-06-09: qty 2

## 2016-06-09 MED ORDER — HEPARIN SODIUM (PORCINE) 1000 UNIT/ML IJ SOLN
INTRAMUSCULAR | Status: AC
  Filled 2016-06-09: qty 1

## 2016-06-09 MED ORDER — LIDOCAINE HCL (PF) 1 % IJ SOLN
INTRAMUSCULAR | Status: AC
  Filled 2016-06-09: qty 30

## 2016-06-09 MED ORDER — ADENOSINE 12 MG/4ML IV SOLN
INTRAVENOUS | Status: AC
  Filled 2016-06-09: qty 16

## 2016-06-09 MED ORDER — FENTANYL CITRATE (PF) 100 MCG/2ML IJ SOLN
INTRAMUSCULAR | Status: AC
  Filled 2016-06-09: qty 2

## 2016-06-09 MED ORDER — IOPAMIDOL (ISOVUE-370) INJECTION 76%
INTRAVENOUS | Status: AC
  Filled 2016-06-09: qty 100

## 2016-06-09 MED ORDER — SODIUM CHLORIDE 0.9 % IV SOLN
INTRAVENOUS | Status: AC
  Administered 2016-06-09: 17:00:00 via INTRAVENOUS

## 2016-06-09 MED ORDER — MIDAZOLAM HCL 2 MG/2ML IJ SOLN
INTRAMUSCULAR | Status: DC | PRN
  Administered 2016-06-09: 2 mg via INTRAVENOUS

## 2016-06-09 MED ORDER — ACETAMINOPHEN 325 MG PO TABS: 325.0000 mg | ORAL_TABLET | Freq: Four times a day (QID) | ORAL | Status: DC | PRN

## 2016-06-09 MED ORDER — NITROGLYCERIN 0.4 MG SL SUBL: 0.4000 mg | SUBLINGUAL_TABLET | SUBLINGUAL | Status: DC | PRN

## 2016-06-09 MED ORDER — IOPAMIDOL (ISOVUE-370) INJECTION 76%
INTRAVENOUS | Status: DC | PRN
  Administered 2016-06-09: 120 mL via INTRAVENOUS

## 2016-06-09 MED ORDER — CLOPIDOGREL BISULFATE 75 MG PO TABS
75.0000 mg | ORAL_TABLET | Freq: Every day | ORAL | Status: DC
  Administered 2016-06-10: 09:00:00 75 mg via ORAL
  Filled 2016-06-09: qty 1

## 2016-06-09 MED ORDER — CLOPIDOGREL BISULFATE 300 MG PO TABS
ORAL_TABLET | ORAL | Status: AC
  Filled 2016-06-09: qty 2

## 2016-06-09 MED ORDER — TOPIRAMATE 25 MG PO TABS
100.0000 mg | ORAL_TABLET | Freq: Every day | ORAL | Status: DC
  Administered 2016-06-09: 21:00:00 100 mg via ORAL
  Filled 2016-06-09: qty 4

## 2016-06-09 MED ORDER — SODIUM CHLORIDE 0.9% FLUSH
3.0000 mL | Freq: Two times a day (BID) | INTRAVENOUS | Status: DC
  Administered 2016-06-09 (×2): 3 mL via INTRAVENOUS

## 2016-06-09 MED ORDER — SODIUM CHLORIDE 0.9 % IV SOLN: 250.0000 mL | INTRAVENOUS | Status: DC | PRN

## 2016-06-09 MED ORDER — LIDOCAINE HCL (PF) 1 % IJ SOLN
INTRAMUSCULAR | Status: DC | PRN
  Administered 2016-06-09: 2 mL

## 2016-06-09 MED ORDER — HEPARIN (PORCINE) IN NACL 2-0.9 UNIT/ML-% IJ SOLN
INTRAMUSCULAR | Status: DC | PRN
  Administered 2016-06-09: 1000 mL

## 2016-06-09 MED ORDER — SODIUM CHLORIDE 0.9% FLUSH: 3.0000 mL | INTRAVENOUS | Status: DC | PRN

## 2016-06-09 MED ORDER — HEPARIN SODIUM (PORCINE) 1000 UNIT/ML IJ SOLN
INTRAMUSCULAR | Status: DC | PRN
  Administered 2016-06-09 (×2): 4000 [IU] via INTRAVENOUS
  Administered 2016-06-09: 2000 [IU] via INTRAVENOUS

## 2016-06-09 MED ORDER — SODIUM CHLORIDE 0.9 % WEIGHT BASED INFUSION: 1.0000 mL/kg/h | INTRAVENOUS | Status: DC

## 2016-06-09 MED ORDER — ANGIOPLASTY BOOK
Freq: Once | Status: AC
  Administered 2016-06-09: 23:00:00
  Filled 2016-06-09: qty 1

## 2016-06-09 MED ORDER — HEPARIN (PORCINE) IN NACL 2-0.9 UNIT/ML-% IJ SOLN
INTRAMUSCULAR | Status: AC
  Filled 2016-06-09: qty 1000

## 2016-06-09 MED ORDER — VERAPAMIL HCL 2.5 MG/ML IV SOLN
INTRAVENOUS | Status: AC
  Filled 2016-06-09: qty 2

## 2016-06-09 MED ORDER — VERAPAMIL HCL 2.5 MG/ML IV SOLN
INTRAVENOUS | Status: DC | PRN
  Administered 2016-06-09: 10 mL via INTRA_ARTERIAL

## 2016-06-09 MED ORDER — ROSUVASTATIN CALCIUM 10 MG PO TABS
5.0000 mg | ORAL_TABLET | Freq: Every evening | ORAL | Status: DC
  Administered 2016-06-09: 5 mg via ORAL
  Filled 2016-06-09: qty 1

## 2016-06-09 MED ORDER — CLOPIDOGREL BISULFATE 300 MG PO TABS
ORAL_TABLET | ORAL | Status: DC | PRN
  Administered 2016-06-09: 600 mg via ORAL

## 2016-06-09 MED ORDER — NITROGLYCERIN 1 MG/10 ML FOR IR/CATH LAB
INTRA_ARTERIAL | Status: DC | PRN
  Administered 2016-06-09: 200 ug via INTRACORONARY

## 2016-06-09 MED ORDER — SODIUM CHLORIDE 0.9% FLUSH: 3.0000 mL | Freq: Two times a day (BID) | INTRAVENOUS | Status: DC

## 2016-06-09 MED ORDER — FERROUS SULFATE 325 (65 FE) MG PO TABS
325.0000 mg | ORAL_TABLET | Freq: Every day | ORAL | Status: DC
  Administered 2016-06-10: 325 mg via ORAL
  Filled 2016-06-09: qty 1

## 2016-06-09 MED ORDER — CALCIUM CARBONATE ANTACID 500 MG PO CHEW
CHEWABLE_TABLET | Freq: Every day | ORAL | Status: DC | PRN
  Filled 2016-06-09: qty 1

## 2016-06-09 SURGICAL SUPPLY — 15 items
BALLN ~~LOC~~ EUPHORA RX 3.5X8 (BALLOONS) ×4
BALLOON ~~LOC~~ EUPHORA RX 3.5X8 (BALLOONS) IMPLANT
CATH OPTITORQUE TIG 4.0 5F (CATHETERS) ×2 IMPLANT
DEVICE RAD COMP TR BAND LRG (VASCULAR PRODUCTS) ×3 IMPLANT
GLIDESHEATH SLEND A-KIT 6F 22G (SHEATH) ×2 IMPLANT
GUIDE CATH RUNWAY 6FR CLS3.5 (CATHETERS) ×3 IMPLANT
GUIDEWIRE PRESSURE COMET II (WIRE) ×2 IMPLANT
KIT ENCORE 26 ADVANTAGE (KITS) ×2 IMPLANT
KIT ESSENTIALS PG (KITS) ×3 IMPLANT
KIT HEART LEFT (KITS) ×4 IMPLANT
PACK CARDIAC CATHETERIZATION (CUSTOM PROCEDURE TRAY) ×4 IMPLANT
STENT SYNERGY DES 3X12 (Permanent Stent) ×2 IMPLANT
TRANSDUCER W/STOPCOCK (MISCELLANEOUS) ×4 IMPLANT
TUBING CIL FLEX 10 FLL-RA (TUBING) ×4 IMPLANT
WIRE SAFE-T 1.5MM-J .035X260CM (WIRE) ×3 IMPLANT

## 2016-06-09 NOTE — Interval H&P Note (Signed)
History and Physical Interval Note:  06/09/2016 10:03 AM  Mark Jackson.  has presented today for surgery, with the diagnosis of chest pain concerning for crescendo angina.  The original scheduled procedure date was delayed due to worsening renal function. Yesterday's creatinine was down to 2.02 from 2.19. He has been hydrated all morning long and now presents for cardiac catheterization without LV gram.  The various methods of treatment have been discussed with the patient and family. After consideration of risks, benefits and other options for treatment, the patient has consented to  Procedure(s): Left Heart Cath and Coronary Angiography (N/A) with possible Percutaneous Coronary Intervention as a surgical intervention .  The patient's history has been reviewed, patient examined, no change in status, stable for surgery.  I have reviewed the patient's chart and labs.  Questions were answered to the patient's satisfaction.     Cath Lab Visit (complete for each Cath Lab visit)  Clinical Evaluation Leading to the Procedure:   ACS: No. - crescendo angina  Non-ACS:    Anginal Classification: CCS III  Anti-ischemic medical therapy: Minimal Therapy (1 class of medications)  Non-Invasive Test Results: No non-invasive testing performed  Prior CABG: No previous CABG    [name]

## 2016-06-09 NOTE — Telephone Encounter (Signed)
See telephone note from 06-08-16
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
## 2016-06-09 NOTE — Progress Notes (Signed)
TR BAND REMOVAL  LOCATION:    right radial  DEFLATED PER PROTOCOL:    Yes.    TIME BAND OFF / DRESSING APPLIED:    1630   SITE UPON ARRIVAL:    Level 0  SITE AFTER BAND REMOVAL:    Level 0  CIRCULATION SENSATION AND MOVEMENT:    Within Normal Limits   Yes.    COMMENTS:   TOLERATED PROCEDURE WELL, POST TRB INSTRUCTIONS GIVEN.

## 2016-06-09 NOTE — Care Management Note (Signed)
Case Management Note  Patient Details  Name: Courtenay Creger. MRN: [id number] Date of Birth: 13-Aug-1929  Subjective/Objective:      S/p coronary stent intervention, will be on plavix and asa, per MD , then when stop asa will restart eliquis which patient was already on .  NCM will cont to follow for dc needs.              Action/Plan:   Expected Discharge Date:                  Expected Discharge Plan:  Home/Self Care  In-House Referral:     Discharge planning Services  CM Consult  Post Acute Care Choice:    Choice offered to:     DME Arranged:    DME Agency:     HH Arranged:    HH Agency:     Status of Service:  In process, will continue to follow  If discussed at Long Length of Stay Meetings, dates discussed:    Additional Comments:  Zenon Mayo, RN 06/09/2016, 3:47 PM

## 2016-06-10 ENCOUNTER — Other Ambulatory Visit: Payer: Self-pay | Admitting: Physician Assistant

## 2016-06-10 ENCOUNTER — Telehealth: Payer: Self-pay | Admitting: Cardiology

## 2016-06-10 DIAGNOSIS — I25110 Atherosclerotic heart disease of native coronary artery with unstable angina pectoris: Secondary | ICD-10-CM | POA: Diagnosis not present

## 2016-06-10 DIAGNOSIS — I200 Unstable angina: Secondary | ICD-10-CM | POA: Diagnosis not present

## 2016-06-10 DIAGNOSIS — N183 Chronic kidney disease, stage 3 (moderate): Principal | ICD-10-CM

## 2016-06-10 DIAGNOSIS — I5042 Chronic combined systolic (congestive) and diastolic (congestive) heart failure: Secondary | ICD-10-CM | POA: Diagnosis not present

## 2016-06-10 DIAGNOSIS — I252 Old myocardial infarction: Secondary | ICD-10-CM | POA: Diagnosis not present

## 2016-06-10 DIAGNOSIS — Z79899 Other long term (current) drug therapy: Secondary | ICD-10-CM | POA: Diagnosis not present

## 2016-06-10 DIAGNOSIS — N179 Acute kidney failure, unspecified: Secondary | ICD-10-CM

## 2016-06-10 DIAGNOSIS — T82855A Stenosis of coronary artery stent, initial encounter: Secondary | ICD-10-CM | POA: Diagnosis not present

## 2016-06-10 DIAGNOSIS — N184 Chronic kidney disease, stage 4 (severe): Secondary | ICD-10-CM | POA: Diagnosis not present

## 2016-06-10 DIAGNOSIS — Z7901 Long term (current) use of anticoagulants: Secondary | ICD-10-CM | POA: Diagnosis not present

## 2016-06-10 DIAGNOSIS — Z8546 Personal history of malignant neoplasm of prostate: Secondary | ICD-10-CM | POA: Diagnosis not present

## 2016-06-10 DIAGNOSIS — Z85828 Personal history of other malignant neoplasm of skin: Secondary | ICD-10-CM | POA: Diagnosis not present

## 2016-06-10 DIAGNOSIS — Z7902 Long term (current) use of antithrombotics/antiplatelets: Secondary | ICD-10-CM | POA: Diagnosis not present

## 2016-06-10 DIAGNOSIS — I130 Hypertensive heart and chronic kidney disease with heart failure and stage 1 through stage 4 chronic kidney disease, or unspecified chronic kidney disease: Secondary | ICD-10-CM | POA: Diagnosis not present

## 2016-06-10 DIAGNOSIS — E785 Hyperlipidemia, unspecified: Secondary | ICD-10-CM | POA: Diagnosis not present

## 2016-06-10 DIAGNOSIS — I480 Paroxysmal atrial fibrillation: Secondary | ICD-10-CM | POA: Diagnosis not present

## 2016-06-10 LAB — BASIC METABOLIC PANEL
ANION GAP: 4 — AB (ref 5–15)
BUN: 27 mg/dL — ABNORMAL HIGH (ref 6–20)
CALCIUM: 8 mg/dL — AB (ref 8.9–10.3)
CO2: 21 mmol/L — ABNORMAL LOW (ref 22–32)
Chloride: 116 mmol/L — ABNORMAL HIGH (ref 101–111)
Creatinine, Ser: 1.95 mg/dL — ABNORMAL HIGH (ref 0.61–1.24)
GFR, EST AFRICAN AMERICAN: 35 mL/min — AB (ref >60)
GFR, EST NON AFRICAN AMERICAN: 30 mL/min — AB (ref >60)
GLUCOSE: 102 mg/dL — AB (ref 65–99)
Potassium: 4.5 mmol/L (ref 3.5–5.1)
Sodium: 141 mmol/L (ref 135–145)

## 2016-06-10 LAB — CBC
HCT: 31.6 % — ABNORMAL LOW (ref 39.0–52.0)
Hemoglobin: 10.2 g/dL — ABNORMAL LOW (ref 13.0–17.0)
MCH: 31.7 pg (ref 26.0–34.0)
MCHC: 32.3 g/dL (ref 30.0–36.0)
MCV: 98.1 fL (ref 78.0–100.0)
PLATELETS: 182 10*3/uL (ref 150–400)
RBC: 3.22 MIL/uL — ABNORMAL LOW (ref 4.22–5.81)
RDW: 13.5 % (ref 11.5–15.5)
WBC: 8.5 10*3/uL (ref 4.0–10.5)

## 2016-06-10 MED ORDER — CLOPIDOGREL BISULFATE 75 MG PO TABS: 75.0000 mg | ORAL_TABLET | Freq: Every day | ORAL | 11 refills | Status: DC

## 2016-06-10 MED ORDER — ASPIRIN 81 MG PO CHEW: 81.0000 mg | CHEWABLE_TABLET | Freq: Every day | ORAL | 0 refills | Status: DC

## 2016-06-10 MED FILL — Ticagrelor Tab 90 MG: ORAL | Qty: 1 | Status: AC

## 2016-06-10 NOTE — Progress Notes (Signed)
    Subjective:  Denies CP or dyspnea   Objective:  Vitals:   06/09/16 2045 06/09/16 2200 06/09/16 2300 06/10/16 0436  BP: (!) 146/57 (!) 139/46  (!) 99/33  Pulse: 64 64  65  Resp: 14 13 11 10   Temp: 97.7 F (36.5 C)   98.4 F (36.9 C)  TempSrc: Oral   Oral  SpO2: 100% 99%  98%  Weight:    180 lb 12.4 oz (82 kg)  Height:        Intake/Output from previous day:  Intake/Output Summary (Last 24 hours) at 06/10/16 4765 Last data filed at 06/10/16 0500  Gross per 24 hour  Intake           946.67 ml  Output             1300 ml  Net          -353.33 ml    Physical Exam: Physical exam: Well-developed well-nourished in no acute distress.  Skin is warm and dry.  HEENT is normal.  Neck is supple.  Chest is clear to auscultation with normal expansion.  Cardiovascular exam is regular rate and rhythm.  Abdominal exam nontender or distended. No masses palpated. Extremities show trace edema. Radial cath site with no hematoma neuro grossly intact    Lab Results: Basic Metabolic Panel:  Recent Labs  06/08/16 1023 06/10/16 0426  NA 139 141  K 4.9 4.5  CL 111* 116*  CO2 22 21*  GLUCOSE 99 102*  BUN 36* 27*  CREATININE 2.06* 1.95*  CALCIUM 8.4* 8.0*   CBC:  Recent Labs  06/10/16 0426  WBC 8.5  HGB 10.2*  HCT 31.6*  MCV 98.1  PLT 182     Assessment/Plan:  1 coronary artery disease-patient is doing well status post PCI of his LAD. Continue aspirin, Plavix and statin.  2 paroxysmal atrial fibrillation-patient remains in sinus rhythm. Continue Cardizem for rate control if atrial fibrillation recurs. We are holding apixaban for one month while he is on dual antiplt therapy; will then DC asa, continue plavix and resume apixaban.  3 chronic stage III renal insufficiency-renal function is stable this morning. He will need this rechecked on November 3 with results to Dr. Ellyn Hack.  4 hypertension-blood pressure controlled. Continue present medications.  5  hyperlipidemia-continue statin. Given documented coronary disease I would favor increasing Crestor to 40 mg daily if tolerated. However I will leave this to Dr. Ellyn Hack.  6 chronic diastolic congestive heart failure-resume previous dose of Lasix.   DC today and follow-up with Dr. Ellyn Hack in 2 weeks. >30 min PA and physician time D2  Kirk Ruths 06/10/2016, 8:32 AM

## 2016-06-10 NOTE — Discharge Summary (Signed)
Discharge Summary    Patient ID: Mark Brockman.,  MRN: [id number], DOB/AGE: 1930-01-09 82 y.o.  Admit date: 06/09/2016 Discharge date: 06/10/2016  Primary Care Provider: [name] Primary Cardiologist: Dr. Ellyn Hack  Discharge Diagnoses    Principal Problem:   [place] angina [facility]) Active Problems:   Essential hypertension   Hyperlipidemia with target LDL less than 70   Paroxysmal atrial fibrillation ([place]); CHA2DSVasc - 4; Now on Eliquis   Chronic diastolic heart failure (HCC)   CAD S/P DES PCI to proximal LAD  Allergies No Active Allergies  Diagnostic Studies/Procedures    Coronary Stent Intervention  06/09/16  Intravascular Pressure Wire/FFR Study  Conclusion     Ost LAD lesion, 85 %stenosed. FFR highly positive  Prox LAD-1 Synergy DES stent, focal 40 % in-stent restenosis.  A STENT SYNERGY DES 3X12 drug eluting stent was successfully placed, and overlaps previously placed stent.  Post intervention, there is a 0% residual stenosis.    Successful FFR guided PCI of ostial LAD lesion just prior to the stent covering a small focal area of in-stent restenosis in the previously placed stent.  Plan: Based on his renal function, we will observe him overnight with post-cath hydration. I don't think that he is a good early discharge candidate.  He will be on aspirin plus Plavix for one month, we will then stop aspirin and restart Eliquis.  Continue diltiazem and statin. He is beta blocker intolerant due to fatigue.  Monitor blood pressure, but would be hesitant to titrate up anti-hypertensive medications prior to discharge.Marland Kitchen      History of Present Illness     Mark Jackson. is a 82 y.o. male with past medical history of CAD, chronic kidney disease stage III-IV, mild AS, chronic systolic/diastolic heart failure, HTN, moderate to severe right carotid artery disease and PAF who presented for scheduled cath.   He was admitted initially for unstable  angina followed by non-ST elevation MI in August 2016. Her PCI to the proximal LAD during the second hospital stay. He had been on antiplatelet agents +anticoagulation for his A. fib, but had issues with GI bleeding. We have suddenly stopped his warfarin but continued his antiplatelet agent. Thought was to consider converting to [place] when close to 1 year out and when we can soon stop Brilinta. Stopped metoprolol due to fatigue.   Seen by Dr. Ellyn Hack 04/21/16 and his switched his Brilinta to Eliquis as he was one year out of his stent.   Again seen 06/01/16 by Dr. Ellyn Hack for several weeks hx of exertional chest discomfort and relieved by rest.  Symptoms were similar to NSTEMI in 03/2015. Also noted is a more prominent T-wave inversions in lateral leads. The patient initially came for cath 06/05/16 however held due to elevated creatinine despite increased fluid intake at home. Cath rescheduled to 06/09/16. Advised to drink extra fluids.   Hospital Course     Consultants: None  Came for scheduled cath. He was pre-hydrated prior to cath for few hours. Held lasix. S/p successful FFR guided PCI of ostial LAD lesion just prior to the stent covering a small focal area of in-stent restenosis in the previously placed stent. We are holding apixaban for one month while he is on dual antiplt therapy. He will be on aspirin plus Plavix for one month, we will then stop aspirin and restart Eliquis. Renal function stable post cath. Recheck BMET in 2 days. Recommended increasing Crestor to 40 mg daily if tolerated.  However will leave this to Dr. Ellyn Hack. Cardizem for rate control. Resume home lasix. Ambulated well.   The patient has been seen by Dr. Stanford Jackson today and deemed ready for discharge home. All follow-up appointments have been scheduled. Discharge medications are listed below.    Discharge Vitals Blood pressure (!) 137/54, pulse 73, temperature 98.2 F (36.8 C), temperature source Oral, resp. rate 15, height  5\' 8"  (1.727 m), weight 180 lb 12.4 oz (82 kg), SpO2 98 %.  Filed Weights   06/09/16 0548 06/10/16 0436  Weight: 180 lb (81.6 kg) 180 lb 12.4 oz (82 kg)    Labs & Radiologic Studies     CBC  Recent Labs  06/10/16 0426  WBC 8.5  HGB 10.2*  HCT 31.6*  MCV 98.1  PLT 426   Basic Metabolic Panel  Recent Labs  06/08/16 1023 06/10/16 0426  NA 139 141  K 4.9 4.5  CL 111* 116*  CO2 22 21*  GLUCOSE 99 102*  BUN 36* 27*  CREATININE 2.06* 1.95*  CALCIUM 8.4* 8.0*   Liver Function Tests No results for input(s): AST, ALT, ALKPHOS, BILITOT, PROT, ALBUMIN in the last 72 hours. No results for input(s): LIPASE, AMYLASE in the last 72 hours. Cardiac Enzymes No results for input(s): CKTOTAL, CKMB, CKMBINDEX, TROPONINI in the last 72 hours. BNP Invalid input(s): POCBNP D-Dimer No results for input(s): DDIMER in the last 72 hours. Hemoglobin A1C No results for input(s): HGBA1C in the last 72 hours. Fasting Lipid Panel No results for input(s): CHOL, HDL, LDLCALC, TRIG, CHOLHDL, LDLDIRECT in the last 72 hours. Thyroid Function Tests No results for input(s): TSH, T4TOTAL, T3FREE, THYROIDAB in the last 72 hours.  Invalid input(s): FREET3  No results found.  Disposition   Pt is being discharged home today in good condition.  Follow-up Plans & Appointments    Follow-up Information    [name], MD. Go on 06/18/2016.   Specialty:  Cardiology Why:  @11 :15  Contact information: [address] [place] [address] [place]. Go on 06/12/2016.   Specialty:  Cardiology Why:  between 7:30am to 4pm for BMET to check kidney function.  Contact information: [address] [place] [phone]         Discharge Instructions    Amb Referral to Cardiac Rehabilitation    Complete by:  As directed    Diagnosis:  Coronary Stents   Diet - low sodium heart healthy    Complete by:  As  directed    Discharge instructions    Complete by:  As directed    No driving for  48 hours. No lifting over 5 lbs for 1 week. No sexual activity for 1 week. Keep procedure site clean & dry. If you notice increased pain, swelling, bleeding or pus, call/return!  You may shower, but no soaking baths/hot tubs/pools for 1 week.   Kidney function check Friday November 8th.   Increase activity slowly    Complete by:  As directed       Discharge Medications   Current Discharge Medication List    START taking these medications   Details  aspirin 81 MG chewable tablet Chew 1 tablet (81 mg total) by mouth daily. Qty: 30 tablet, Refills: 0    clopidogrel (PLAVIX) 75 MG tablet Take 1 tablet (75 mg total) by mouth daily with breakfast. Qty: 30 tablet, Refills: 11      CONTINUE  these medications which have NOT CHANGED   Details  acetaminophen (TYLENOL) 325 MG tablet Take 325 mg by mouth every 6 (six) hours as needed for headache.     Ca Carbonate-Mag Hydroxide (ROLAIDS PO) Take 1 tablet by mouth daily as needed (acid reflux).    clotrimazole-betamethasone (LOTRISONE) cream Apply 1 application topically 2 (two) times daily. Qty: 45 g, Refills: 1    cyanocobalamin (,VITAMIN B-12,) 1000 MCG/ML injection Inject 1,000 mcg into the muscle every 30 (thirty) days.    cycloSPORINE (RESTASIS) 0.05 % ophthalmic emulsion Place 1 drop into both eyes 2 (two) times daily.    diltiazem (CARDIZEM CD) 120 MG 24 hr capsule TAKE (1) CAPSULE DAILY. Qty: 30 capsule, Refills: 10    ferrous sulfate 325 (65 FE) MG tablet Take 325 mg by mouth daily with breakfast.    furosemide (LASIX) 20 MG tablet Take 1 tablet (20 mg total) by mouth daily. Qty: 30 tablet, Refills: 11    hydrocortisone (ANUSOL-HC) 2.5 % rectal cream Place 1 application rectally 2 (two) times daily. Please give pt applicators. Thank you. Qty: 30 g, Refills: 1    loperamide (IMODIUM A-D) 2 MG tablet Take 2 mg by mouth 4 (four) times daily as  needed for diarrhea or loose stools.    nitroGLYCERIN (NITROSTAT) 0.4 MG SL tablet Place 1 tablet (0.4 mg total) under the tongue every 5 (five) minutes as needed for chest pain. Qty: 25 tablet, Refills: 3    pseudoephedrine (SUDAFED) 30 MG tablet Take 30 mg by mouth every 8 (eight) hours as needed (migraines).     rosuvastatin (CRESTOR) 5 MG tablet Take 1 tablet (5 mg total) by mouth daily. Qty: 90 tablet, Refills: 3    topiramate (TOPAMAX) 100 MG tablet Take 100 mg by mouth at bedtime.       STOP taking these medications     apixaban (ELIQUIS) 5 MG TABS tablet            Outstanding Labs/Studies   BMET  Duration of Discharge Encounter   Greater than 30 minutes including physician time.  Signed, Misa Fedorko PA-C 06/10/2016, 10:33 AM

## 2016-06-10 NOTE — Telephone Encounter (Signed)
Discussed w patient. He preferred to go to [facility] rather than [facility] for lab draw. BMET ordered for [facility], patient aware he may go any time Friday for non-fasting test.

## 2016-06-10 NOTE — Progress Notes (Signed)
CARDIAC REHAB PHASE I   PRE:  Rate/Rhythm: 74 SR  BP:  Sitting: 137/54        SaO2: 97 RA  MODE:  Ambulation: 900 ft   POST:  Rate/Rhythm: 87 SR  BP:  Sitting: 148/65         SaO2: 99 RA  Pt ambulated 900 ft on RA, handheld assist, steady gait, tolerated well, denies any complaints. Completed PCI/stent education.  Reviewed risk factors, anti-platelet therapy, stent card, activity restrictions, ntg, exercise, heart healthy diet, daily weights, sodium restrictions, s/s heart failure and phase 2 cardiac rehab. Pt verbalized understanding. Pt agrees to phase 2 cardiac rehab referral, will send to [facility] per pt request.  Pt to recliner after walk, call bell within reach.  [place], RN, BSN 06/10/2016 9:05 AM

## 2016-06-10 NOTE — Telephone Encounter (Signed)
Please call,confused about his lab work on Friday. He says he is down to come here and [employer].

## 2016-06-12 ENCOUNTER — Other Ambulatory Visit: Payer: Medicare Other

## 2016-06-13 LAB — BASIC METABOLIC PANEL
BUN: 29 mg/dL — ABNORMAL HIGH (ref 7–25)
CHLORIDE: 111 mmol/L — AB (ref 98–110)
CO2: 21 mmol/L (ref 20–31)
Calcium: 8.7 mg/dL (ref 8.6–10.3)
Creat: 2.04 mg/dL — ABNORMAL HIGH (ref 0.70–1.11)
Glucose, Bld: 83 mg/dL (ref 65–99)
POTASSIUM: 5.1 mmol/L (ref 3.5–5.3)
Sodium: 141 mmol/L (ref 135–146)

## 2016-06-15 ENCOUNTER — Encounter: Payer: Self-pay | Admitting: Family Medicine

## 2016-06-15 ENCOUNTER — Ambulatory Visit (INDEPENDENT_AMBULATORY_CARE_PROVIDER_SITE_OTHER): Payer: Medicare Other | Admitting: Family Medicine

## 2016-06-15 DIAGNOSIS — I5032 Chronic diastolic (congestive) heart failure: Secondary | ICD-10-CM | POA: Diagnosis not present

## 2016-06-15 DIAGNOSIS — I200 Unstable angina: Secondary | ICD-10-CM

## 2016-06-15 DIAGNOSIS — Z9861 Coronary angioplasty status: Secondary | ICD-10-CM

## 2016-06-15 DIAGNOSIS — N183 Chronic kidney disease, stage 3 unspecified: Secondary | ICD-10-CM

## 2016-06-15 DIAGNOSIS — E785 Hyperlipidemia, unspecified: Secondary | ICD-10-CM

## 2016-06-15 DIAGNOSIS — I480 Paroxysmal atrial fibrillation: Secondary | ICD-10-CM | POA: Diagnosis not present

## 2016-06-15 DIAGNOSIS — N401 Enlarged prostate with lower urinary tract symptoms: Secondary | ICD-10-CM | POA: Insufficient documentation

## 2016-06-15 DIAGNOSIS — R351 Nocturia: Secondary | ICD-10-CM

## 2016-06-15 DIAGNOSIS — I2510 Atherosclerotic heart disease of native coronary artery without angina pectoris: Secondary | ICD-10-CM | POA: Diagnosis not present

## 2016-06-15 MED ORDER — ROSUVASTATIN CALCIUM 20 MG PO TABS: 20.0000 mg | ORAL_TABLET | Freq: Every day | ORAL | 3 refills | Status: DC

## 2016-06-15 NOTE — Assessment & Plan Note (Signed)
S: Hospital follow up with recent PCI of ostial LAD lesion stenting. He is no longer having the exertional chest pain that led him to catheterization after also being noted to have EKG changes. He does have a mild aching in lower chest chronic 1/10 that is worse with palpation but not worse with exertion. At his baseline for SOB. He is compliant with aspirin and plavix for the next month- after that point will be transitioned back to eliquis and plavix will be continued. Cardiology follow up later this week A/P: Patient doing very well s/p recent stent

## 2016-06-15 NOTE — Assessment & Plan Note (Signed)
S: fatigue on beta blockers- but diltiazem has controlled rate when he has gone into atrial fibrillation. To patients knowledge he has not been in a fib since January 2016 when had cardioversion. He has been compliant with eliquis until recently being started on plavix/aspirin for 1 month s/p stent with holding eliquis A/P: continue current regimen with plans to transition to plavix and eliquis in 1 month per cardiology directions.
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
## 2016-06-15 NOTE — Assessment & Plan Note (Signed)
S: Patient has been compliant with lasix 20mg . He is holding off on using his compression stockings until cardiology follow up A/P: Edema 1+ and patient with some weight gain. May need to consider further diuresis. Fortunately no shortness of breath or orthopnea- monitor only for now.

## 2016-06-15 NOTE — Patient Instructions (Addendum)
With recent blockage- let's get more aggressive on your cholesterol and increase your dose to 20mg  daily.   Schedule your next B12 shot with Roselyn Reef for around the 15th  Absolute pleasure to meet you. I look forward to working with you with the hope of at [place] doing that until age 82

## 2016-06-15 NOTE — Assessment & Plan Note (Signed)
S: Creatinine 1.9-2.2 range in 2016. Bordering on stage IV. Follows Dr. Moshe Cipro. Had 2 day follow up bmet after catheterization which showed largely stable kidney function A/P: continue bp control, lipid control, avoid nsaids, and renal follow up. Thankful for cardiology close care revolving around creatinine follow up in hospital.

## 2016-06-15 NOTE — Progress Notes (Signed)
Pre visit review using our clinic review tool, if applicable. No additional management support is needed unless otherwise documented below in the visit note. 

## 2016-06-15 NOTE — Assessment & Plan Note (Signed)
S: LDL technically at goal when last checked last 2016. Notes from discharge mention titrating up to 40mg  crestor to see if we could maximize medical management Lab Results  Component Value Date   CHOL 102 08/21/2014   HDL 32 (L) 08/21/2014   LDLCALC 57 08/21/2014   TRIG 65 08/21/2014   CHOLHDL 3.2 08/21/2014  A/P: Titrated to 20mg  crestor today

## 2016-06-16 ENCOUNTER — Encounter: Payer: Self-pay | Admitting: Cardiology

## 2016-06-18 ENCOUNTER — Encounter: Payer: Self-pay | Admitting: Cardiology

## 2016-06-18 ENCOUNTER — Ambulatory Visit (INDEPENDENT_AMBULATORY_CARE_PROVIDER_SITE_OTHER): Payer: Medicare Other | Admitting: Cardiology

## 2016-06-18 VITALS — BP 150/80 | HR 62 | Ht 68.0 in | Wt 190.0 lb

## 2016-06-18 DIAGNOSIS — I10 Essential (primary) hypertension: Secondary | ICD-10-CM | POA: Diagnosis not present

## 2016-06-18 DIAGNOSIS — Z9861 Coronary angioplasty status: Secondary | ICD-10-CM | POA: Diagnosis not present

## 2016-06-18 DIAGNOSIS — I200 Unstable angina: Secondary | ICD-10-CM

## 2016-06-18 DIAGNOSIS — I739 Peripheral vascular disease, unspecified: Secondary | ICD-10-CM

## 2016-06-18 DIAGNOSIS — I2510 Atherosclerotic heart disease of native coronary artery without angina pectoris: Secondary | ICD-10-CM

## 2016-06-18 DIAGNOSIS — I779 Disorder of arteries and arterioles, unspecified: Secondary | ICD-10-CM

## 2016-06-18 DIAGNOSIS — I5032 Chronic diastolic (congestive) heart failure: Secondary | ICD-10-CM | POA: Diagnosis not present

## 2016-06-18 DIAGNOSIS — E785 Hyperlipidemia, unspecified: Secondary | ICD-10-CM

## 2016-06-18 DIAGNOSIS — I480 Paroxysmal atrial fibrillation: Secondary | ICD-10-CM | POA: Diagnosis not present

## 2016-06-18 DIAGNOSIS — Z7901 Long term (current) use of anticoagulants: Secondary | ICD-10-CM | POA: Diagnosis not present

## 2016-06-18 MED ORDER — APIXABAN 2.5 MG PO TABS: 2.5000 mg | ORAL_TABLET | Freq: Two times a day (BID) | ORAL | 11 refills | Status: DC

## 2016-06-18 NOTE — Assessment & Plan Note (Addendum)
Doing well status post second PCI to the LAD. No further angina symptoms.  He is on diltiazem as opposed to beta blocker (History of fatigue with beta blocker) along with statin. Continue current doses.  Plan: We'll continue aspirin plus Plavix to continue for 1 month post PCI. On December 1, he will stop aspirin and restart ELIQUIS. Continue Plavix.

## 2016-06-18 NOTE — Assessment & Plan Note (Signed)
Monitored by Dr. Gwenlyn Found. Carotid Dopplers are being followed per routine.

## 2016-06-18 NOTE — Assessment & Plan Note (Signed)
Deathly had significant symptoms explained by his proximal-ostial LAD lesion. Now treated with PCI. No further symptoms. He has a steady prescription of nitroglycerin sublingual.

## 2016-06-18 NOTE — Patient Instructions (Addendum)
Jul 10 2017 STOP TAKING ASPIRIN 81 MG  AND RESTART ELIQIUS  TO 2.5 MG ONE TABLET TWICE A DAY.  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   Your physician wants you to follow-up in: April-May 2018 with Dr Ellyn Hack- 41 MIN. You will receive a reminder letter in the mail two months in advance. If you don't receive a letter, please call our office to schedule the follow-up appointment.   If you need a refill on your cardiac medications before your next appointment, please call your pharmacy.

## 2016-06-18 NOTE — Assessment & Plan Note (Signed)
Well-controlled on his current dose of Crestor as of January 2016. Probably should recheck some last ones we have January 2016. His LDL has gone up since then, would maybe need to titrate up to 40 mg Crestor.

## 2016-06-18 NOTE — Assessment & Plan Note (Signed)
Blood pressures little up today. But just earlier this week it was in the 120s. Will just simply continue current medications. I'm reluctant to do many changes at this time as he is stable.

## 2016-06-18 NOTE — Assessment & Plan Note (Signed)
Subjective: He had fatigue with beta blockers. Doing well on diltiazem. Has not had any further episodes of A. fib but I can tell. Not since January 2016.  Plan: Was doing well with ELIQUIS pre-cath. This is currently on hold because of being on dual antiplatelet. Plan would be on December 1 he will stop aspirin and restart ELIQUIS - will start at 2.5 mg twice a day based on his creatinine of 2 and age.
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
## 2016-06-22 NOTE — Addendum Note (Signed)
Addended by: Leland Johns A on: 06/22/2016 04:15 PM   Modules accepted: Orders

## 2016-06-24 ENCOUNTER — Ambulatory Visit (INDEPENDENT_AMBULATORY_CARE_PROVIDER_SITE_OTHER): Payer: Medicare Other

## 2016-06-24 DIAGNOSIS — E539 Vitamin B deficiency, unspecified: Secondary | ICD-10-CM

## 2016-06-24 MED ORDER — CYANOCOBALAMIN 1000 MCG/ML IJ SOLN
1000.0000 ug | Freq: Once | INTRAMUSCULAR | Status: AC
  Administered 2016-06-24: 1000 ug via INTRAMUSCULAR

## 2016-07-07 ENCOUNTER — Telehealth (HOSPITAL_COMMUNITY): Payer: Self-pay | Admitting: Cardiac Rehabilitation

## 2016-07-07 NOTE — Telephone Encounter (Signed)
pc to pt to discuss enrolling in cardiac rehab. Pt declined. He is exercising on his own. Pt will call if he decides at later time he would like to participate.

## 2016-07-24 ENCOUNTER — Ambulatory Visit: Payer: Medicare Other

## 2016-07-28 DIAGNOSIS — D631 Anemia in chronic kidney disease: Secondary | ICD-10-CM | POA: Diagnosis not present

## 2016-07-28 DIAGNOSIS — N183 Chronic kidney disease, stage 3 (moderate): Secondary | ICD-10-CM | POA: Diagnosis not present

## 2016-07-28 DIAGNOSIS — Z6828 Body mass index (BMI) 28.0-28.9, adult: Secondary | ICD-10-CM | POA: Diagnosis not present

## 2016-07-29 ENCOUNTER — Ambulatory Visit (INDEPENDENT_AMBULATORY_CARE_PROVIDER_SITE_OTHER): Payer: Medicare Other | Admitting: Family Medicine

## 2016-07-29 DIAGNOSIS — E538 Deficiency of other specified B group vitamins: Secondary | ICD-10-CM | POA: Diagnosis not present

## 2016-07-29 MED ORDER — CYANOCOBALAMIN 1000 MCG/ML IJ SOLN
1000.0000 ug | Freq: Once | INTRAMUSCULAR | Status: AC
  Administered 2016-07-29: 1000 ug via INTRAMUSCULAR

## 2016-07-29 NOTE — Progress Notes (Signed)
b12 deficiency. q 30 day injections. Needs repeat 1 month  Mark Jackson

## 2016-08-26 ENCOUNTER — Ambulatory Visit: Payer: Medicare Other

## 2016-09-24 ENCOUNTER — Other Ambulatory Visit: Payer: Self-pay | Admitting: Emergency Medicine

## 2016-09-28 ENCOUNTER — Ambulatory Visit (INDEPENDENT_AMBULATORY_CARE_PROVIDER_SITE_OTHER): Payer: Medicare Other | Admitting: Family Medicine

## 2016-09-28 ENCOUNTER — Encounter: Payer: Self-pay | Admitting: Family Medicine

## 2016-09-28 VITALS — BP 140/76 | HR 65 | Temp 97.7°F | Ht 68.75 in | Wt 193.0 lb

## 2016-09-28 DIAGNOSIS — E538 Deficiency of other specified B group vitamins: Secondary | ICD-10-CM | POA: Diagnosis not present

## 2016-09-28 DIAGNOSIS — E785 Hyperlipidemia, unspecified: Secondary | ICD-10-CM

## 2016-09-28 DIAGNOSIS — I480 Paroxysmal atrial fibrillation: Secondary | ICD-10-CM | POA: Diagnosis not present

## 2016-09-28 DIAGNOSIS — I5032 Chronic diastolic (congestive) heart failure: Secondary | ICD-10-CM

## 2016-09-28 DIAGNOSIS — Z23 Encounter for immunization: Secondary | ICD-10-CM

## 2016-09-28 DIAGNOSIS — I779 Disorder of arteries and arterioles, unspecified: Secondary | ICD-10-CM

## 2016-09-28 DIAGNOSIS — I10 Essential (primary) hypertension: Secondary | ICD-10-CM | POA: Diagnosis not present

## 2016-09-28 DIAGNOSIS — I739 Peripheral vascular disease, unspecified: Secondary | ICD-10-CM

## 2016-09-28 LAB — COMPREHENSIVE METABOLIC PANEL
ALT: 10 U/L (ref 0–53)
AST: 18 U/L (ref 0–37)
Albumin: 3.7 g/dL (ref 3.5–5.2)
Alkaline Phosphatase: 55 U/L (ref 39–117)
BILIRUBIN TOTAL: 0.5 mg/dL (ref 0.2–1.2)
BUN: 41 mg/dL — AB (ref 6–23)
CALCIUM: 8.7 mg/dL (ref 8.4–10.5)
CHLORIDE: 111 meq/L (ref 96–112)
CO2: 25 mEq/L (ref 19–32)
CREATININE: 2.09 mg/dL — AB (ref 0.40–1.50)
GFR: 32.31 mL/min — ABNORMAL LOW (ref >60.00)
GLUCOSE: 100 mg/dL — AB (ref 70–99)
Potassium: 4.5 mEq/L (ref 3.5–5.1)
SODIUM: 143 meq/L (ref 135–145)
Total Protein: 6.3 g/dL (ref 6.0–8.3)

## 2016-09-28 LAB — CBC
HCT: 35.7 % — ABNORMAL LOW (ref 39.0–52.0)
Hemoglobin: 12 g/dL — ABNORMAL LOW (ref 13.0–17.0)
MCHC: 33.6 g/dL (ref 30.0–36.0)
MCV: 97.6 fl (ref 78.0–100.0)
PLATELETS: 216 10*3/uL (ref 150.0–400.0)
RBC: 3.66 Mil/uL — ABNORMAL LOW (ref 4.22–5.81)
RDW: 13.2 % (ref 11.5–15.5)
WBC: 8.6 10*3/uL (ref 4.0–10.5)

## 2016-09-28 LAB — VITAMIN B12: Vitamin B-12: >1500 pg/mL — ABNORMAL HIGH (ref 211–911)

## 2016-09-28 LAB — LIPID PANEL
CHOL/HDL RATIO: 3
Cholesterol: 106 mg/dL (ref 0–200)
HDL: 39.2 mg/dL (ref >39.00)
LDL CALC: 52 mg/dL (ref 0–99)
NONHDL: 66.55
TRIGLYCERIDES: 71 mg/dL (ref 0.0–149.0)
VLDL: 14.2 mg/dL (ref 0.0–40.0)

## 2016-09-28 MED ORDER — CYANOCOBALAMIN 1000 MCG/ML IJ SOLN
1000.0000 ug | Freq: Once | INTRAMUSCULAR | Status: AC
  Administered 2016-09-28: 1000 ug via INTRAMUSCULAR

## 2016-09-28 NOTE — Assessment & Plan Note (Signed)
S: controlled poorly mildly on lasix and diltiazem.  BP Readings from Last 3 Encounters:  09/28/16 140/76  06/18/16 (!) 150/80  06/15/16 124/70  A/P:Continue current meds for now- weight up about 10 lbs- advised weight loss effort at least back to 190 or closer to 180- if BP does not improve with this may need additional meds

## 2016-09-28 NOTE — Progress Notes (Signed)
Subjective:  Mark Jackson. is a 83 y.o. year old very pleasant male patient who presents for/with See problem oriented charting ROS- No chest pain or shortness of breath (other than with stairs or if really pushes himself). No headache or blurry vision.     Past Medical History-  Patient Active Problem List   Diagnosis Date Noted  . Crescendo angina ([place]) 06/01/2016    Priority: High  . CAD S/P DES PCI to proximal LAD 03/22/2015    Priority: High  . Chronic diastolic heart failure ([place]) 08/23/2014    Priority: High  . Paroxysmal atrial fibrillation ([place]); CHA2DSVasc - 4; Now on Eliquis 08/20/2014    Priority: High    Class: Diagnosis of  . CKD (chronic kidney disease) stage 3, GFR 30-59 ml/min 08/20/2014    Priority: High  . PROSTATE CANCER, HX OF 10/22/2008    Priority: High  . BPH associated with nocturia 06/15/2016    Priority: Medium  . Migraine     Priority: Medium  . B12 deficiency     Priority: Medium  . Aortic stenosis 03/14/2015    Priority: Medium  . Hereditary and idiopathic peripheral neuropathy 01/12/2014    Priority: Medium  . H/O syncope 09/03/2013    Priority: Medium  . Right-sided carotid artery disease; followed by Dr. Ellyn Hack 03/02/2013    Priority: Medium  . Hyperlipidemia with target LDL less than 70 03/02/2013    Priority: Medium  . ANEMIA 10/23/2008    Priority: Medium  . Essential hypertension 10/22/2008    Priority: Medium  . Sleep apnea 10/22/2008    Priority: Medium  . Personal history of colonic polyps 10/22/2008    Priority: Medium  . Chronic diarrhea     Priority: Low  . Perianal dermatitis 06/19/2015    Priority: Low  . Rectal bleeding 04/25/2015    Priority: Low  . Abnormal finding on EKG 08/31/2014    Priority: Low  . Dyspnea 08/31/2014    Priority: Low  . Long term current use of anticoagulant therapy 08/27/2014    Priority: Low  . Obesity (BMI 30-39.9) 09/03/2013    Priority: Low  . GLAUCOMA 10/23/2008    Priority:  Low  . HEMORRHOIDS 10/22/2008    Priority: Low  . ARTHRITIS 10/22/2008    Priority: Low    Medications- reviewed and updated Current Outpatient Prescriptions  Medication Sig Dispense Refill  . apixaban (ELIQUIS) 2.5 MG TABS tablet Take 1 tablet (2.5 mg total) by mouth 2 (two) times daily. 60 tablet 11  . Ca Carbonate-Mag Hydroxide (ROLAIDS PO) Take 1 tablet by mouth daily as needed (acid reflux).    . clopidogrel (PLAVIX) 75 MG tablet Take 1 tablet (75 mg total) by mouth daily with breakfast. 30 tablet 11  . cycloSPORINE (RESTASIS) 0.05 % ophthalmic emulsion Place 1 drop into both eyes 2 (two) times daily.    Marland Kitchen diltiazem (CARDIZEM CD) 120 MG 24 hr capsule TAKE (1) CAPSULE DAILY. 30 capsule 10  . ferrous sulfate 325 (65 FE) MG tablet Take 325 mg by mouth daily with breakfast.    . furosemide (LASIX) 20 MG tablet TAKE 1 TABLET EACH DAY. 30 tablet 11  . hydrocortisone (ANUSOL-HC) 2.5 % rectal cream Place 1 application rectally 2 (two) times daily. Please give pt applicators. Thank you. (Patient taking differently: Place 1 application rectally daily as needed for hemorrhoids. Please give pt applicators. Thank you.) 30 g 1  . loperamide (IMODIUM A-D) 2 MG tablet Take 2 mg by  mouth 4 (four) times daily as needed for diarrhea or loose stools.    . nitroGLYCERIN (NITROSTAT) 0.4 MG SL tablet Place 1 tablet (0.4 mg total) under the tongue every 5 (five) minutes as needed for chest pain. 25 tablet 3  . rosuvastatin (CRESTOR) 20 MG tablet Take 1 tablet (20 mg total) by mouth daily. 91 tablet 3  . topiramate (TOPAMAX) 100 MG tablet Take 100 mg by mouth at bedtime.      Current Facility-Administered Medications  Medication Dose Route Frequency Provider Last Rate Last Dose  . cyanocobalamin ((VITAMIN B-12)) injection 1,000 mcg  1,000 mcg Intramuscular Q30 days [name], MD   1,000 mcg at 03/31/16 1342  . cyanocobalamin ((VITAMIN B-12)) injection 1,000 mcg  1,000 mcg Intramuscular Once [name], MD        Objective: BP 140/76 (BP Location: Left Arm, Patient Position: Sitting, Cuff Size: Large)   Pulse 65   Temp 97.7 F (36.5 C) (Oral)   Ht 5' 8.75" (1.746 m)   Wt 193 lb (87.5 kg)   SpO2 97%   BMI 28.71 kg/m  Gen: NAD, resting comfortably CV: RRR, systolic murmur but no rubs or gallops (appears in sinus today) Lungs: CTAB no crackles, wheeze, rhonchi  Ext: 1+ edema stable Skin: warm, dry  Assessment/Plan:  Chronic diastolic heart failure (HCC) S: some of his edema is likely venous insuffiencey. Also mild diastolic CHF and CKD III. Compliant with lasix 20mg  and no increased edema- some increase in weight but admits to increase in sweet intake (weight up 13 lbs over 4 months) A/P: will continue present lasix. Exercising 3x a week. no added salt.    Paroxysmal atrial fibrillation (HCC); CHA2DSVasc - 4; Now on Eliquis S: compliant with diltiazem for rate control and eliquis for anticoagulation dr. Ellyn Hack also has him on plavix with his CAD history- no bleeding issues though does have some anemia and takes iron (some of this could be from CKD which borders on stage IV at times) A/P: continue current medicatoins.    Right-sided carotid artery disease; followed by Dr. Ellyn Hack S: 04/2016 Stable 60-79% stenosis RICA, stable 7-42% LICA, >59% right ECA. We discussed risks of this including stroke.  A/P: we are going to update lipids with hope of LDL under 70. Focus on weight loss to get BP at goal.    Essential hypertension S: controlled poorly mildly on lasix and diltiazem.  BP Readings from Last 3 Encounters:  09/28/16 140/76  06/18/16 (!) 150/80  06/15/16 124/70  A/P:Continue current meds for now- weight up about 10 lbs- advised weight loss effort at least back to 190 or closer to 180- if BP does not improve with this may need additional meds   Hyperlipidemia with target LDL less than 70 Update lipids today- hopeful ldl under 70 with crestor from 5 to 20mg  after  stent in 2017   No Follow-up on file.  Orders Placed This Encounter  Procedures  . Pneumococcal polysaccharide vaccine 23-valent greater than or equal to 2yo subcutaneous/IM  . Lipid panel    [place]    Order Specific Question:   Has the patient fasted?    Answer:   No  . CBC    [place]  . Comprehensive metabolic panel    [place]    Order Specific Question:   Has the patient fasted?    Answer:   No  . Vitamin B12    Meds ordered this encounter  Medications  . cyanocobalamin ((VITAMIN  B-12)) injection 1,000 mcg    Return precautions advised.  [name], MD

## 2016-09-28 NOTE — Patient Instructions (Addendum)
b12 injection today  Final pneumonia shot- pneumovax 23 today   Please stop by lab before you go  I am also happy to have your wife as a patient- you can schedule this with the check out desk or call in but make sure to reference this note    ______________________________________________________________________  Starting October 1st 2018, I will be transferring to our new location: [facility] ([address] of [address] from [employer]) [place], [place] Phone: [phone]  I would love to have you remain my patient at this new location as long as it remains convenient for you. I am excited about the opportunity to have x-ray and sports medicine in the new building but will really miss the awesome staff and physicians at [facility]. Continue to schedule appointments at [facility] and we will automatically transfer them to the [place] location starting October 1st.

## 2016-09-28 NOTE — Assessment & Plan Note (Signed)
S: some of his edema is likely venous insuffiencey. Also mild diastolic CHF and CKD III. Compliant with lasix 20mg  and no increased edema- some increase in weight but admits to increase in sweet intake (weight up 13 lbs over 4 months) A/P: will continue present lasix. Exercising 3x a week. no added salt.

## 2016-09-28 NOTE — Assessment & Plan Note (Signed)
Update lipids today- hopeful ldl under 70 with crestor from 5 to 20mg  after stent in 2017

## 2016-09-28 NOTE — Assessment & Plan Note (Signed)
S: 04/2016 Stable 60-79% stenosis RICA, stable 2-35% LICA, >57% right ECA. We discussed risks of this including stroke.  A/P: we are going to update lipids with hope of LDL under 70. Focus on weight loss to get BP at goal.

## 2016-09-28 NOTE — Assessment & Plan Note (Signed)
S: compliant with diltiazem for rate control and eliquis for anticoagulation dr. Ellyn Hack also has him on plavix with his CAD history- no bleeding issues though does have some anemia and takes iron (some of this could be from CKD which borders on stage IV at times) A/P: continue current medicatoins.

## 2016-09-28 NOTE — Progress Notes (Signed)
Pre visit review using our clinic review tool, if applicable. No additional management support is needed unless otherwise documented below in the visit note. 

## 2016-10-14 ENCOUNTER — Encounter: Payer: Self-pay | Admitting: Physician Assistant

## 2016-10-14 ENCOUNTER — Ambulatory Visit (INDEPENDENT_AMBULATORY_CARE_PROVIDER_SITE_OTHER): Payer: Medicare Other | Admitting: Physician Assistant

## 2016-10-14 VITALS — BP 160/80 | HR 72 | Ht 68.75 in | Wt 192.8 lb

## 2016-10-14 DIAGNOSIS — I480 Paroxysmal atrial fibrillation: Secondary | ICD-10-CM | POA: Diagnosis not present

## 2016-10-14 DIAGNOSIS — E785 Hyperlipidemia, unspecified: Secondary | ICD-10-CM | POA: Diagnosis not present

## 2016-10-14 DIAGNOSIS — R600 Localized edema: Secondary | ICD-10-CM

## 2016-10-14 DIAGNOSIS — I10 Essential (primary) hypertension: Secondary | ICD-10-CM | POA: Diagnosis not present

## 2016-10-14 DIAGNOSIS — N183 Chronic kidney disease, stage 3 unspecified: Secondary | ICD-10-CM

## 2016-10-14 DIAGNOSIS — I2510 Atherosclerotic heart disease of native coronary artery without angina pectoris: Secondary | ICD-10-CM | POA: Diagnosis not present

## 2016-10-14 DIAGNOSIS — I6529 Occlusion and stenosis of unspecified carotid artery: Secondary | ICD-10-CM

## 2016-10-14 MED ORDER — DILTIAZEM HCL ER COATED BEADS 180 MG PO CP24: 180.0000 mg | ORAL_CAPSULE | Freq: Every day | ORAL | 3 refills | Status: DC
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
## 2016-10-14 NOTE — Patient Instructions (Addendum)
Your physician has recommended you make the following change in your medication:  Rule  AS NEEDED  FOR   SWELLING  START  DILTIAZEM  CD 180 MG EVERY DAY   Your physician recommends that you schedule a follow-up appointment in:  [place]

## 2016-10-15 DIAGNOSIS — Z85828 Personal history of other malignant neoplasm of skin: Secondary | ICD-10-CM | POA: Diagnosis not present

## 2016-10-15 DIAGNOSIS — L821 Other seborrheic keratosis: Secondary | ICD-10-CM | POA: Diagnosis not present

## 2016-10-15 DIAGNOSIS — D1801 Hemangioma of skin and subcutaneous tissue: Secondary | ICD-10-CM | POA: Diagnosis not present

## 2016-10-15 DIAGNOSIS — L570 Actinic keratosis: Secondary | ICD-10-CM | POA: Diagnosis not present

## 2016-10-23 DIAGNOSIS — H02831 Dermatochalasis of right upper eyelid: Secondary | ICD-10-CM | POA: Diagnosis not present

## 2016-10-23 DIAGNOSIS — H02834 Dermatochalasis of left upper eyelid: Secondary | ICD-10-CM | POA: Diagnosis not present

## 2016-10-26 ENCOUNTER — Ambulatory Visit (INDEPENDENT_AMBULATORY_CARE_PROVIDER_SITE_OTHER): Payer: Medicare Other

## 2016-10-26 DIAGNOSIS — E538 Deficiency of other specified B group vitamins: Secondary | ICD-10-CM | POA: Diagnosis not present

## 2016-10-26 MED ORDER — CYANOCOBALAMIN 1000 MCG/ML IJ SOLN
1000.0000 ug | Freq: Once | INTRAMUSCULAR | Status: AC
  Administered 2016-10-26: 1000 ug via INTRAMUSCULAR

## 2016-10-26 NOTE — Progress Notes (Addendum)
History of low b12 has been treated with monthly injections for years. Last b12 normal range. Could consider going to q5 or q6 weeks to see how he does Lab Results  Component Value Date   VITAMINB12 >1500 (H) 09/28/2016    [name], MD

## 2016-10-26 NOTE — Assessment & Plan Note (Signed)
History of low b12 has been treated with monthly injections for years. Last b12 normal range. Could consider going to q5 or q6 weeks to see how he does Lab Results  Component Value Date   VITAMINB12 >1500 (H) 09/28/2016

## 2016-11-27 ENCOUNTER — Ambulatory Visit (INDEPENDENT_AMBULATORY_CARE_PROVIDER_SITE_OTHER): Payer: Medicare Other

## 2016-11-27 DIAGNOSIS — E538 Deficiency of other specified B group vitamins: Secondary | ICD-10-CM

## 2016-11-27 MED ORDER — CYANOCOBALAMIN 1000 MCG/ML IJ SOLN
1000.0000 ug | Freq: Once | INTRAMUSCULAR | Status: AC
  Administered 2016-11-27: 1000 ug via INTRAMUSCULAR

## 2016-11-27 NOTE — Progress Notes (Addendum)
Pt in today for B1 injection due to low vit B12 level. Tolerated injection well. I did inform patient that according to previous visit note he can now schedule 5-6 weeks apart. Verbalized understanding.   I have reviewed and agree with note, evaluation, plan.   [name], MD

## 2016-12-08 DIAGNOSIS — I350 Nonrheumatic aortic (valve) stenosis: Secondary | ICD-10-CM

## 2016-12-08 HISTORY — DX: Nonrheumatic aortic (valve) stenosis: I35.0

## 2017-01-06 DIAGNOSIS — K529 Noninfective gastroenteritis and colitis, unspecified: Secondary | ICD-10-CM | POA: Insufficient documentation

## 2017-01-06 DIAGNOSIS — E538 Deficiency of other specified B group vitamins: Secondary | ICD-10-CM | POA: Insufficient documentation

## 2017-01-06 DIAGNOSIS — G43009 Migraine without aura, not intractable, without status migrainosus: Secondary | ICD-10-CM | POA: Diagnosis not present

## 2017-01-08 ENCOUNTER — Ambulatory Visit (INDEPENDENT_AMBULATORY_CARE_PROVIDER_SITE_OTHER): Payer: Medicare Other | Admitting: *Deleted

## 2017-01-08 DIAGNOSIS — E538 Deficiency of other specified B group vitamins: Secondary | ICD-10-CM | POA: Diagnosis not present

## 2017-01-08 MED ORDER — CYANOCOBALAMIN 1000 MCG/ML IJ SOLN
1000.0000 ug | Freq: Once | INTRAMUSCULAR | Status: AC
  Administered 2017-01-08: 1000 ug via INTRAMUSCULAR

## 2017-01-15 DIAGNOSIS — C61 Malignant neoplasm of prostate: Secondary | ICD-10-CM | POA: Diagnosis not present

## 2017-01-15 LAB — PSA

## 2017-01-26 ENCOUNTER — Ambulatory Visit (INDEPENDENT_AMBULATORY_CARE_PROVIDER_SITE_OTHER): Payer: Medicare Other | Admitting: Family Medicine

## 2017-01-26 ENCOUNTER — Encounter: Payer: Self-pay | Admitting: Family Medicine

## 2017-01-26 VITALS — BP 138/70 | HR 68 | Temp 98.1°F | Ht 68.75 in | Wt 193.2 lb

## 2017-01-26 DIAGNOSIS — I480 Paroxysmal atrial fibrillation: Secondary | ICD-10-CM | POA: Diagnosis not present

## 2017-01-26 DIAGNOSIS — I459 Conduction disorder, unspecified: Secondary | ICD-10-CM | POA: Diagnosis not present

## 2017-01-26 DIAGNOSIS — E785 Hyperlipidemia, unspecified: Secondary | ICD-10-CM | POA: Diagnosis not present

## 2017-01-26 DIAGNOSIS — I10 Essential (primary) hypertension: Secondary | ICD-10-CM | POA: Diagnosis not present

## 2017-01-26 DIAGNOSIS — I5032 Chronic diastolic (congestive) heart failure: Secondary | ICD-10-CM

## 2017-01-26 DIAGNOSIS — I6529 Occlusion and stenosis of unspecified carotid artery: Secondary | ICD-10-CM | POA: Diagnosis not present

## 2017-01-26 NOTE — Patient Instructions (Addendum)
Cardiology office will call you within 1-2 weeks about heart monitor  No changes otherwise, I think you are doing well

## 2017-01-26 NOTE — Assessment & Plan Note (Signed)
S: well controlled on crestor 20mg . No myalgias.  Lab Results  Component Value Date   CHOL 106 09/28/2016   HDL 39.20 09/28/2016   LDLCALC 52 09/28/2016   TRIG 71.0 09/28/2016   CHOLHDL 3 09/28/2016   A/P: continue current medications

## 2017-01-26 NOTE — Assessment & Plan Note (Addendum)
S: diltiazem for rate control and anticoagulation with eliquis. Believed to be in sinus for 2 years. Also on plavix with CAD history. Mild anemia on the above and does take iron- no history bleeding issues and with other history not interested in pursuing updated colonoscopy  For last few weeks- Weak beat or skipped beat when he checks his wrist beat. Recurs nightly. Would not know it if didn't check his pulse. Has been checking pulse every night since then.  A/P: continue current meds. Get 24 hour monitor to evaluate rhythm when he is noting the skipped beat

## 2017-01-26 NOTE — Assessment & Plan Note (Signed)
S: controlled on lasix and diltiazem 180mg  XR though would prefer lower BP than today. On lasix and diltiazem. Home #s 120s and 130s. Some into 140s low. Rare above that but has a few at home ASCVD 10 year risk calculation if age 81-79: on statin BP Readings from Last 3 Encounters:  01/26/17 138/70  10/14/16 (!) 160/80  09/28/16 140/76  A/P: We discussed blood pressure goal of <140/90. Continue current meds

## 2017-01-26 NOTE — Assessment & Plan Note (Signed)
S: compliant with lasix 20mg . Weight stable from last visit here in February. Trying to keep salt low in diet.  A/P: continue current meds

## 2017-01-26 NOTE — Progress Notes (Signed)
Subjective:  Mark Jackson. is a 83 y.o. year old very pleasant male patient who presents for/with See problem oriented charting ROS- no chest pain. Occasionally feeling skipped beat when checking pulse particularly at night. No increased shortness of breath. Stable edema.    Past Medical History-  Patient Active Problem List   Diagnosis Date Noted  . Crescendo angina ([place]) 06/01/2016    Priority: High  . CAD S/P DES PCI to proximal LAD 03/22/2015    Priority: High  . Chronic diastolic heart failure ([place]) 08/23/2014    Priority: High  . Paroxysmal atrial fibrillation ([place]); CHA2DSVasc - 4; Now on Eliquis 08/20/2014    Priority: High    Class: Diagnosis of  . CKD (chronic kidney disease) stage 3, GFR 30-59 ml/min 08/20/2014    Priority: High  . PROSTATE CANCER, HX OF 10/22/2008    Priority: High  . BPH associated with nocturia 06/15/2016    Priority: Medium  . Migraine     Priority: Medium  . B12 deficiency     Priority: Medium  . Aortic stenosis 03/14/2015    Priority: Medium  . Hereditary and idiopathic peripheral neuropathy 01/12/2014    Priority: Medium  . H/O syncope 09/03/2013    Priority: Medium  . Right-sided carotid artery disease; followed by Dr. Ellyn Hack 03/02/2013    Priority: Medium  . Hyperlipidemia with target LDL less than 70 03/02/2013    Priority: Medium  . ANEMIA 10/23/2008    Priority: Medium  . Essential hypertension 10/22/2008    Priority: Medium  . Sleep apnea 10/22/2008    Priority: Medium  . Personal history of colonic polyps 10/22/2008    Priority: Medium  . Chronic diarrhea     Priority: Low  . Perianal dermatitis 06/19/2015    Priority: Low  . Rectal bleeding 04/25/2015    Priority: Low  . Abnormal finding on EKG 08/31/2014    Priority: Low  . Dyspnea 08/31/2014    Priority: Low  . Long term current use of anticoagulant therapy 08/27/2014    Priority: Low  . Obesity (BMI 30-39.9) 09/03/2013    Priority: Low  . GLAUCOMA 10/23/2008     Priority: Low  . HEMORRHOIDS 10/22/2008    Priority: Low  . ARTHRITIS 10/22/2008    Priority: Low    Medications- reviewed and updated Current Outpatient Prescriptions  Medication Sig Dispense Refill  . apixaban (ELIQUIS) 2.5 MG TABS tablet Take 1 tablet (2.5 mg total) by mouth 2 (two) times daily. 60 tablet 11  . Ca Carbonate-Mag Hydroxide (ROLAIDS PO) Take 1 tablet by mouth daily as needed (acid reflux).    . clopidogrel (PLAVIX) 75 MG tablet Take 1 tablet (75 mg total) by mouth daily with breakfast. 30 tablet 11  . cycloSPORINE (RESTASIS) 0.05 % ophthalmic emulsion Place 1 drop into both eyes daily as needed.     . diltiazem (CARDIZEM CD) 180 MG 24 hr capsule Take 1 capsule (180 mg total) by mouth daily. 90 capsule 3  . ferrous sulfate 325 (65 FE) MG tablet Take 325 mg by mouth daily with breakfast.    . furosemide (LASIX) 20 MG tablet TAKE 1 TABLET EACH DAY. 30 tablet 11  . hydrocortisone (ANUSOL-HC) 2.5 % rectal cream Place 1 application rectally 2 (two) times daily. Please give pt applicators. Thank you. 30 g 1  . hydrocortisone (ANUSOL-HC) 2.5 % rectal cream Place 1 application rectally daily as needed for hemorrhoids or itching.    . loperamide (IMODIUM A-D) 2  MG tablet Take 2 mg by mouth 4 (four) times daily as needed for diarrhea or loose stools.    . nitroGLYCERIN (NITROSTAT) 0.4 MG SL tablet Place 1 tablet (0.4 mg total) under the tongue every 5 (five) minutes as needed for chest pain. 25 tablet 3  . rosuvastatin (CRESTOR) 20 MG tablet Take 1 tablet (20 mg total) by mouth daily. 91 tablet 3  . topiramate (TOPAMAX) 100 MG tablet Take 100 mg by mouth at bedtime.      Current Facility-Administered Medications  Medication Dose Route Frequency Provider Last Rate Last Dose  . cyanocobalamin ((VITAMIN B-12)) injection 1,000 mcg  1,000 mcg Intramuscular Q30 days [name], MD   1,000 mcg at 03/31/16 1342    Objective: BP 138/70 (BP Location: Left Arm, Patient Position:  Sitting, Cuff Size: Large)   Pulse 68   Temp 98.1 F (36.7 C) (Oral)   Ht 5' 8.75" (1.746 m)   Wt 193 lb 3.2 oz (87.6 kg)   SpO2 98%   BMI 28.74 kg/m  Gen: NAD, resting comfortably CV: RRR. Systolic murmur. Appears to be in sinus Lungs: CTAB no crackles, wheeze, rhonchi Abdomen: soft/nontender/nondistended/normal bowel sounds. No rebound or guarding.  Ext: 1+ edema unchanged Skin: warm, dry Neuro: grossly normal, moves all extremities  Assessment/Plan:  Chronic diastolic heart failure (HCC) S: compliant with lasix 20mg . Weight stable from last visit here in February. Trying to keep salt low in diet.  A/P: continue current meds  Paroxysmal atrial fibrillation ([place]); CHA2DSVasc - 4; Now on Eliquis S: diltiazem for rate control and anticoagulation with eliquis. Believed to be in sinus for 2 years. Also on plavix with CAD history. Mild anemia on the above and does take iron- no history bleeding issues and with other history not interested in pursuing updated colonoscopy  For last few weeks- Weak beat or skipped beat when he checks his wrist beat. Recurs nightly. Would not know it if didn't check his pulse. Has been checking pulse every night since then.  A/P: continue current meds. Get 24 hour monitor to evaluate rhythm when he is noting the skipped beat  Essential hypertension S: controlled on lasix and diltiazem 180mg  XR though would prefer lower BP than today. On lasix and diltiazem. Home #s 120s and 130s. Some into 140s low. Rare above that but has a few at home ASCVD 10 year risk calculation if age 65-79: on statin BP Readings from Last 3 Encounters:  01/26/17 138/70  10/14/16 (!) 160/80  09/28/16 140/76  A/P: We discussed blood pressure goal of <140/90. Continue current meds  Hyperlipidemia with target LDL less than 70 S: well controlled on crestor 20mg . No myalgias.  Lab Results  Component Value Date   CHOL 106 09/28/2016   HDL 39.20 09/28/2016   LDLCALC 52 09/28/2016    TRIG 71.0 09/28/2016   CHOLHDL 3 09/28/2016   A/P: continue current medications   Return in about 4 months (around 05/28/2017) for follow up- or sooner if needed. Dr. Ellyn Hack in September. Dr. Moshe Cipro later this week. Dr. Junious Silk saw last week- doesn't know results yet.   Will get shingrix at his pharmacy  Orders Placed This Encounter  Procedures  . Holter monitor - 24 hour    Standing Status:   Future    Standing Expiration Date:   01/27/2027   Return precautions advised.  [name], MD

## 2017-01-28 DIAGNOSIS — N183 Chronic kidney disease, stage 3 (moderate): Secondary | ICD-10-CM | POA: Diagnosis not present

## 2017-01-28 DIAGNOSIS — D631 Anemia in chronic kidney disease: Secondary | ICD-10-CM | POA: Diagnosis not present

## 2017-01-28 DIAGNOSIS — Z6828 Body mass index (BMI) 28.0-28.9, adult: Secondary | ICD-10-CM | POA: Diagnosis not present

## 2017-02-05 ENCOUNTER — Other Ambulatory Visit: Payer: Self-pay | Admitting: Family Medicine

## 2017-02-12 ENCOUNTER — Ambulatory Visit: Payer: Medicare Other

## 2017-02-16 ENCOUNTER — Ambulatory Visit (INDEPENDENT_AMBULATORY_CARE_PROVIDER_SITE_OTHER): Payer: Medicare Other

## 2017-02-16 DIAGNOSIS — E538 Deficiency of other specified B group vitamins: Secondary | ICD-10-CM | POA: Diagnosis not present

## 2017-02-16 MED ORDER — CYANOCOBALAMIN 1000 MCG/ML IJ SOLN
1000.0000 ug | Freq: Once | INTRAMUSCULAR | Status: AC
  Administered 2017-02-16: 1000 ug via INTRAMUSCULAR

## 2017-03-03 ENCOUNTER — Ambulatory Visit: Payer: Medicare Other

## 2017-03-23 ENCOUNTER — Ambulatory Visit (INDEPENDENT_AMBULATORY_CARE_PROVIDER_SITE_OTHER): Payer: Medicare Other

## 2017-03-23 VITALS — BP 144/70 | HR 66 | Ht 69.0 in | Wt 193.0 lb

## 2017-03-23 DIAGNOSIS — Z0000 Encounter for general adult medical examination without abnormal findings: Secondary | ICD-10-CM

## 2017-03-23 DIAGNOSIS — E538 Deficiency of other specified B group vitamins: Secondary | ICD-10-CM

## 2017-03-23 MED ORDER — CYANOCOBALAMIN 1000 MCG/ML IJ SOLN
1000.0000 ug | Freq: Once | INTRAMUSCULAR | Status: AC
  Administered 2017-03-23: 1000 ug via INTRAMUSCULAR

## 2017-03-23 NOTE — Progress Notes (Signed)
I have reviewed and agree with note, evaluation, plan.   Fiore Detjen, MD  
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
## 2017-03-23 NOTE — Patient Instructions (Addendum)
Mark Jackson , Thank you for taking time to come for your Medicare Wellness Visit. I appreciate your ongoing commitment to your health goals. Please review the following plan we discussed and let me know if I can assist you in the future.   Alzheimer's Association / Family information and training  [email or web address] Loss adjuster, chartered for information  Driving resource center; Can send out driving contract   SAFETY; [email or web address]  [name]  (please mail donations to this address) [address], [address]  [name], [name] P: [phone] F: [name], [name] P: [phone] F: [name], [name], [name] P: [phone] F: [phone]  Families to call the 800 number to get information on all the resources in the area 24 hour 1 [phone]  Can provide resources; Questions about Dementia; Support groups; Give the caregiver information regarding respite ([facility]) Call the [employer];as they oversee who gets the grant fund for certain areas (967-893-8101) Also have Tools to help navigate the needs of the patient  Opportunities for free educational programs online 100 support groups [name];  Just starting Early stage program and care partners for the patient and the family 12 weeks of a support group;  12 weeks of social engagement component End with Educational wrap up [place] the 800 number given for more information  All's connected  Let Dr. [name] know on your next visit when your tetanus was taken  May consider Long term plans;  As bringing the laundry downstairs     These are the goals we discussed: Goals    . patient          Maintain health Walks x 3 to 4 times a week       This is a list of the  screening recommended for you and due dates:  Health Maintenance  Topic Date Due  . Flu Shot  03/10/2017  . Tetanus Vaccine  03/10/2018*  . Pneumonia vaccines  Completed  *Topic was postponed. The date shown is not the original due date.   Fall Prevention in the Home Falls can cause injuries and can affect people from all age groups. There are many simple things that you can do to make your home safe and to help prevent falls. What can I do on the outside of my home?  Regularly repair the edges of walkways and driveways and fix any cracks.  Remove high doorway thresholds.  Trim any shrubbery on the main path into your home.  Use bright outdoor lighting.  Clear walkways of debris and clutter, including tools and rocks.  Regularly check that handrails are securely fastened and in good repair. Both sides of any steps should have handrails.  Install guardrails along the edges of any raised decks or porches.  Have leaves, snow, and ice cleared regularly.  Use sand or salt on walkways during winter months.  In the garage, clean up any spills right away, including grease or oil spills. What can I do in the bathroom?  Use night lights.  Install grab bars by the toilet and in the tub and shower. Do not use towel bars as grab bars.  Use non-skid mats or decals on the floor of the tub or shower.  If you need to sit down while you are in the shower, use a plastic, non-slip stool.  Keep the floor dry. Immediately clean up any water that spills on the floor.  Remove soap buildup in the tub or shower on a regular basis.  Attach bath mats securely with double-sided non-slip rug tape.  Remove throw rugs and other tripping hazards from the floor. What can I do in the bedroom?  Use night lights.  Make sure that a bedside light is easy to reach.  Do not use oversized bedding that drapes onto the floor.  Have a firm chair that has side arms to use for getting dressed.  Remove  throw rugs and other tripping hazards from the floor. What can I do in the kitchen?  Clean up any spills right away.  Avoid walking on wet floors.  Place frequently used items in easy-to-reach places.  If you need to reach for something above you, use a sturdy step stool that has a grab bar.  Keep electrical cables out of the way.  Do not use floor polish or wax that makes floors slippery. If you have to use wax, make sure that it is non-skid floor wax.  Remove throw rugs and other tripping hazards from the floor. What can I do in the stairways?  Do not leave any items on the stairs.  Make sure that there are handrails on both sides of the stairs. Fix handrails that are broken or loose. Make sure that handrails are as long as the stairways.  Check any carpeting to make sure that it is firmly attached to the stairs. Fix any carpet that is loose or worn.  Avoid having throw rugs at the top or bottom of stairways, or secure the rugs with carpet tape to prevent them from moving.  Make sure that you have a light switch at the top of the stairs and the bottom of the stairs. If you do not have them, have them installed. What are some other fall prevention tips?  Wear closed-toe shoes that fit well and support your feet. Wear shoes that have rubber soles or low heels.  When you use a stepladder, make sure that it is completely opened and that the sides are firmly locked. Have someone hold the ladder while you are using it. Do not climb a closed stepladder.  Add color or contrast paint or tape to grab bars and handrails in your home. Place contrasting color strips on the first and last steps.  Use mobility aids as needed, such as canes, walkers, scooters, and crutches.  Turn on lights if it is dark. Replace any light bulbs that burn out.  Set up furniture so that there are clear paths. Keep the furniture in the same spot.  Fix any uneven floor surfaces.  Choose a carpet design that  does not hide the edge of steps of a stairway.  Be aware of any and all pets.  Review your medicines with your healthcare provider. Some medicines can cause dizziness or changes in blood pressure, which increase your risk of falling. Talk with your health care provider about other ways that you can decrease your risk of falls. This may include working with a physical therapist or trainer to improve your strength, balance, and endurance. This information is not intended to replace advice given to you by your health care provider. Make sure you discuss any questions you have with your health care provider. Document Released: 07/17/2002 Document Revised: 12/24/2015 Document Reviewed: 08/31/2014 Elsevier Interactive Patient Education  2017 [place] Maintenance, Male A healthy lifestyle and preventive care is important for your health and wellness. Ask your health care provider  about what schedule of regular examinations is right for you. What should I know about weight and diet? Eat a Healthy Diet  Eat plenty of vegetables, fruits, whole grains, low-fat dairy products, and lean protein.  Do not eat a lot of foods high in solid fats, added sugars, or salt.  Maintain a Healthy Weight Regular exercise can help you achieve or maintain a healthy weight. You should:  Do at least 150 minutes of exercise each week. The exercise should increase your heart rate and make you sweat (moderate-intensity exercise).  Do strength-training exercises at least twice a week.  Watch Your Levels of Cholesterol and Blood Lipids  Have your blood tested for lipids and cholesterol every 5 years starting at 81 years of age. If you are at high risk for heart disease, you should start having your blood tested when you are 81 years old. You may need to have your cholesterol levels checked more often if: ? Your lipid or cholesterol levels are high. ? You are older than 80 years of age. ? You are at high  risk for heart disease.  What should I know about cancer screening? Many types of cancers can be detected early and may often be prevented. Lung Cancer  You should be screened every year for lung cancer if: ? You are a current smoker who has smoked for at least 30 years. ? You are a former smoker who has quit within the past 15 years.  Talk to your health care provider about your screening options, when you should start screening, and how often you should be screened.  Colorectal Cancer  Routine colorectal cancer screening usually begins at 81 years of age and should be repeated every 5-10 years until you are 81 years old. You may need to be screened more often if early forms of precancerous polyps or small growths are found. Your health care provider may recommend screening at an earlier age if you have risk factors for colon cancer.  Your health care provider may recommend using home test kits to check for hidden blood in the stool.  A small camera at the end of a tube can be used to examine your colon (sigmoidoscopy or colonoscopy). This checks for the earliest forms of colorectal cancer.  Prostate and Testicular Cancer  Depending on your age and overall health, your health care provider may do certain tests to screen for prostate and testicular cancer.  Talk to your health care provider about any symptoms or concerns you have about testicular or prostate cancer.  Skin Cancer  Check your skin from head to toe regularly.  Tell your health care provider about any new moles or changes in moles, especially if: ? There is a change in a mole's size, shape, or color. ? You have a mole that is larger than a pencil eraser.  Always use sunscreen. Apply sunscreen liberally and repeat throughout the day.  Protect yourself by wearing long sleeves, pants, a wide-brimmed hat, and sunglasses when outside.  What should I know about heart disease, diabetes, and high blood pressure?  If you  are 5-52 years of age, have your blood pressure checked every 3-5 years. If you are 72 years of age or older, have your blood pressure checked every year. You should have your blood pressure measured twice-once when you are at a hospital or clinic, and once when you are not at a hospital or clinic. Record the average of the two measurements. To check your blood pressure  when you are not at a hospital or clinic, you can use: ? An automated blood pressure machine at a pharmacy. ? A home blood pressure monitor.  Talk to your health care provider about your target blood pressure.  If you are between 66-74 years old, ask your health care provider if you should take aspirin to prevent heart disease.  Have regular diabetes screenings by checking your fasting blood sugar level. ? If you are at a normal weight and have a low risk for diabetes, have this test once every three years after the age of 61. ? If you are overweight and have a high risk for diabetes, consider being tested at a younger age or more often.  A one-time screening for abdominal aortic aneurysm (AAA) by ultrasound is recommended for men aged 64-75 years who are current or former smokers. What should I know about preventing infection? Hepatitis B If you have a higher risk for hepatitis B, you should be screened for this virus. Talk with your health care provider to find out if you are at risk for hepatitis B infection. Hepatitis C Blood testing is recommended for:  Everyone born from [birth year] through 1963.  Anyone with known risk factors for hepatitis C.  Sexually Transmitted Diseases (STDs)  You should be screened each year for STDs including gonorrhea and chlamydia if: ? You are sexually active and are younger than 81 years of age. ? You are older than 81 years of age and your health care provider tells you that you are at risk for this type of infection. ? Your sexual activity has changed since you were last screened and you are  at an increased risk for chlamydia or gonorrhea. Ask your health care provider if you are at risk.  Talk with your health care provider about whether you are at high risk of being infected with HIV. Your health care provider may recommend a prescription medicine to help prevent HIV infection.  What else can I do?  Schedule regular health, dental, and eye exams.  Stay current with your vaccines (immunizations).  Do not use any tobacco products, such as cigarettes, chewing tobacco, and e-cigarettes. If you need help quitting, ask your health care provider.  Limit alcohol intake to no more than 2 drinks per day. One drink equals 12 ounces of beer, 5 ounces of wine, or 1 ounces of hard liquor.  Do not use street drugs.  Do not share needles.  Ask your health care provider for help if you need support or information about quitting drugs.  Tell your health care provider if you often feel depressed.  Tell your health care provider if you have ever been abused or do not feel safe at home. This information is not intended to replace advice given to you by your health care provider. Make sure you discuss any questions you have with your health care provider. Document Released: 01/23/2008 Document Revised: 03/25/2016 Document Reviewed: 04/30/2015 Elsevier Interactive Patient Education  [employer].

## 2017-04-13 ENCOUNTER — Ambulatory Visit (INDEPENDENT_AMBULATORY_CARE_PROVIDER_SITE_OTHER): Payer: Medicare Other | Admitting: Cardiology

## 2017-04-13 VITALS — BP 120/66 | HR 63 | Ht 69.0 in | Wt 192.4 lb

## 2017-04-13 DIAGNOSIS — I200 Unstable angina: Secondary | ICD-10-CM

## 2017-04-13 DIAGNOSIS — I5032 Chronic diastolic (congestive) heart failure: Secondary | ICD-10-CM

## 2017-04-13 DIAGNOSIS — Z9861 Coronary angioplasty status: Secondary | ICD-10-CM | POA: Diagnosis not present

## 2017-04-13 DIAGNOSIS — I10 Essential (primary) hypertension: Secondary | ICD-10-CM | POA: Diagnosis not present

## 2017-04-13 DIAGNOSIS — Z7901 Long term (current) use of anticoagulants: Secondary | ICD-10-CM | POA: Diagnosis not present

## 2017-04-13 DIAGNOSIS — I350 Nonrheumatic aortic (valve) stenosis: Secondary | ICD-10-CM

## 2017-04-13 DIAGNOSIS — I2510 Atherosclerotic heart disease of native coronary artery without angina pectoris: Secondary | ICD-10-CM | POA: Diagnosis not present

## 2017-04-13 DIAGNOSIS — R600 Localized edema: Secondary | ICD-10-CM | POA: Diagnosis not present

## 2017-04-13 DIAGNOSIS — I480 Paroxysmal atrial fibrillation: Secondary | ICD-10-CM | POA: Diagnosis not present

## 2017-04-13 DIAGNOSIS — E785 Hyperlipidemia, unspecified: Secondary | ICD-10-CM | POA: Diagnosis not present

## 2017-04-13 MED ORDER — FUROSEMIDE 20 MG PO TABS: ORAL_TABLET | ORAL | 11 refills | Status: DC

## 2017-04-13 NOTE — Patient Instructions (Addendum)
RECOMMEND  WEARING SUPORT KNEE-HIS DURING THE DAY.   LASIX MAY TAKE AN ADDITIONAL EXTRA LASIX IF NEEDED.   Your physician wants you to follow-up in [place]. You will receive a reminder letter in the mail two months in advance. If you don't receive a letter, please call our office to schedule the follow-up appointment.   If you need a refill on your cardiac medications before your next appointment, please call your pharmacy.
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
## 2017-04-15 ENCOUNTER — Encounter: Payer: Self-pay | Admitting: Cardiology

## 2017-04-15 DIAGNOSIS — R600 Localized edema: Secondary | ICD-10-CM | POA: Insufficient documentation

## 2017-04-15 NOTE — Assessment & Plan Note (Signed)
No bleeding - had prior ho GI bleed in 2016. Remains stable on Eliquis & Plavix.

## 2017-04-15 NOTE — Assessment & Plan Note (Signed)
No further angina after PCI. On diltiazem.

## 2017-04-15 NOTE — Assessment & Plan Note (Signed)
Probably more related to Venous Stasis & not HFPEF. Diltiazem is also likely playing a role - but would not change simply 2/2 edema.  Recommend support stockings & continued low dose diuretic. Foot elevation & walking.

## 2017-04-15 NOTE — Assessment & Plan Note (Signed)
Mild AS - would only reassess with Echo if murmur gets worse.

## 2017-04-15 NOTE — Assessment & Plan Note (Signed)
Really No CHF symptoms -class I (or II at worst).  Edema is more related to venous stasis than CHF - no PND or orthopnea. On stable dose of Lasix - ok to take PRN additional dose.

## 2017-04-15 NOTE — Assessment & Plan Note (Signed)
Well controlled on increased dose of Crestor.

## 2017-04-15 NOTE — Assessment & Plan Note (Addendum)
Doing well with no recurrent Angina after his latest LAD stent. No further angina or CHF symptoms.  Using Diltiazem over Beta Blocker due to fatigue symptoms on Beta Blocker. On Plavix without ASA since Eliquis started  -- oK to hold Plavix for a day or so if bad bleeding (but would like to keep this going with overlapping Ostial-Proximal LAD stents. On moderate dose statin.

## 2017-04-15 NOTE — Assessment & Plan Note (Addendum)
No symptoms to suggest recurrence. - >~2 years in Sinus since DCCV in Jan 2016. He noted some palpitations to his PCP in June - did not wear monitor b/c Sx resolved.  On Eliquis for DOAC Diltazem for rate control

## 2017-04-27 ENCOUNTER — Ambulatory Visit (INDEPENDENT_AMBULATORY_CARE_PROVIDER_SITE_OTHER): Payer: Medicare Other | Admitting: *Deleted

## 2017-04-27 DIAGNOSIS — E538 Deficiency of other specified B group vitamins: Secondary | ICD-10-CM

## 2017-04-27 MED ORDER — CYANOCOBALAMIN 1000 MCG/ML IJ SOLN
1000.0000 ug | Freq: Once | INTRAMUSCULAR | Status: AC
  Administered 2017-04-27: 1000 ug via INTRAMUSCULAR

## 2017-05-06 ENCOUNTER — Other Ambulatory Visit: Payer: Self-pay | Admitting: Physician Assistant

## 2017-05-25 ENCOUNTER — Ambulatory Visit (INDEPENDENT_AMBULATORY_CARE_PROVIDER_SITE_OTHER): Payer: Medicare Other | Admitting: Family Medicine

## 2017-05-25 ENCOUNTER — Encounter: Payer: Self-pay | Admitting: Family Medicine

## 2017-05-25 VITALS — BP 136/70 | HR 58 | Temp 98.3°F | Ht 69.0 in | Wt 192.0 lb

## 2017-05-25 DIAGNOSIS — I200 Unstable angina: Secondary | ICD-10-CM | POA: Diagnosis not present

## 2017-05-25 DIAGNOSIS — I10 Essential (primary) hypertension: Secondary | ICD-10-CM

## 2017-05-25 DIAGNOSIS — I480 Paroxysmal atrial fibrillation: Secondary | ICD-10-CM | POA: Diagnosis not present

## 2017-05-25 DIAGNOSIS — I5032 Chronic diastolic (congestive) heart failure: Secondary | ICD-10-CM | POA: Diagnosis not present

## 2017-05-25 DIAGNOSIS — D509 Iron deficiency anemia, unspecified: Secondary | ICD-10-CM | POA: Diagnosis not present

## 2017-05-25 DIAGNOSIS — R739 Hyperglycemia, unspecified: Secondary | ICD-10-CM | POA: Diagnosis not present

## 2017-05-25 LAB — COMPREHENSIVE METABOLIC PANEL
ALBUMIN: 3.6 g/dL (ref 3.5–5.2)
ALT: 10 U/L (ref 0–53)
AST: 15 U/L (ref 0–37)
Alkaline Phosphatase: 53 U/L (ref 39–117)
BUN: 39 mg/dL — AB (ref 6–23)
CALCIUM: 8.8 mg/dL (ref 8.4–10.5)
CHLORIDE: 109 meq/L (ref 96–112)
CO2: 24 meq/L (ref 19–32)
Creatinine, Ser: 2.26 mg/dL — ABNORMAL HIGH (ref 0.40–1.50)
GFR: 29.47 mL/min — ABNORMAL LOW (ref >60.00)
Glucose, Bld: 101 mg/dL — ABNORMAL HIGH (ref 70–99)
POTASSIUM: 4.5 meq/L (ref 3.5–5.1)
SODIUM: 140 meq/L (ref 135–145)
Total Bilirubin: 0.4 mg/dL (ref 0.2–1.2)
Total Protein: 6.2 g/dL (ref 6.0–8.3)

## 2017-05-25 LAB — CBC
HEMATOCRIT: 33.7 % — AB (ref 39.0–52.0)
Hemoglobin: 11.2 g/dL — ABNORMAL LOW (ref 13.0–17.0)
MCHC: 33.3 g/dL (ref 30.0–36.0)
MCV: 100.3 fl — AB (ref 78.0–100.0)
Platelets: 221 10*3/uL (ref 150.0–400.0)
RBC: 3.35 Mil/uL — AB (ref 4.22–5.81)
RDW: 13.5 % (ref 11.5–15.5)
WBC: 9.4 10*3/uL (ref 4.0–10.5)

## 2017-05-25 LAB — HEMOGLOBIN A1C: HEMOGLOBIN A1C: 6 % (ref 4.6–6.5)

## 2017-05-25 NOTE — Assessment & Plan Note (Signed)
S: on eliquis for anticoagulation and diltiazem for rate control. Has been in sinus for 2 years thankfully. plavix for CAD history. Takes iron for mild anemia- declines updating colonoscopy  24 hour monitor last visit as was experiencing some palpitations- symptoms resolved and never wore monitor A/P: continue current medications

## 2017-05-25 NOTE — Patient Instructions (Addendum)
Please let us know when you get the flu shot so we can enter it into the computer  Check with pharmacy about tetanus shot since you are due

## 2017-05-25 NOTE — Progress Notes (Signed)
Subjective:  Mark Jackson. is a 83 y.o. year old very pleasant male patient who presents for/with See problem oriented charting ROS- denies chest pain or shortness of breath. Edema slightly better with compression stockings. No longer having palpitations.    Past Medical History-  Patient Active Problem List   Diagnosis Date Noted  . Crescendo angina ([place]) 06/01/2016    Priority: High  . CAD S/P DES PCI to proximal LAD 03/22/2015    Priority: High  . Chronic diastolic heart failure ([place]) 08/23/2014    Priority: High  . Paroxysmal atrial fibrillation ([place]); CHA2DSVasc - 4; Now on Eliquis 08/20/2014    Priority: High    Class: Diagnosis of  . CKD (chronic kidney disease) stage 3, GFR 30-59 ml/min (HCC) 08/20/2014    Priority: High  . PROSTATE CANCER, HX OF 10/22/2008    Priority: High  . BPH associated with nocturia 06/15/2016    Priority: Medium  . Migraine     Priority: Medium  . B12 deficiency     Priority: Medium  . Aortic stenosis 03/14/2015    Priority: Medium  . Hereditary and idiopathic peripheral neuropathy 01/12/2014    Priority: Medium  . H/O syncope 09/03/2013    Priority: Medium  . Right-sided carotid artery disease; followed by Dr. Ellyn Hack 03/02/2013    Priority: Medium  . Hyperlipidemia with target LDL less than 70 03/02/2013    Priority: Medium  . ANEMIA 10/23/2008    Priority: Medium  . Essential hypertension 10/22/2008    Priority: Medium  . Sleep apnea 10/22/2008    Priority: Medium  . Personal history of colonic polyps 10/22/2008    Priority: Medium  . Chronic diarrhea     Priority: Low  . Perianal dermatitis 06/19/2015    Priority: Low  . Rectal bleeding 04/25/2015    Priority: Low  . Abnormal finding on EKG 08/31/2014    Priority: Low  . Dyspnea 08/31/2014    Priority: Low  . Long term current use of anticoagulant therapy 08/27/2014    Priority: Low  . Obesity (BMI 30-39.9) 09/03/2013    Priority: Low  . GLAUCOMA 10/23/2008   Priority: Low  . HEMORRHOIDS 10/22/2008    Priority: Low  . ARTHRITIS 10/22/2008    Priority: Low  . Bilateral lower extremity edema 04/15/2017    Medications- reviewed and updated Current Outpatient Prescriptions  Medication Sig Dispense Refill  . apixaban (ELIQUIS) 2.5 MG TABS tablet Take 1 tablet (2.5 mg total) by mouth 2 (two) times daily. 60 tablet 11  . Ca Carbonate-Mag Hydroxide (ROLAIDS PO) Take 1 tablet by mouth daily as needed (acid reflux).    . clopidogrel (PLAVIX) 75 MG tablet TAKE 1 TABLET EVERY DAY WITH BREAKFAST. 60 tablet 11  . cycloSPORINE (RESTASIS) 0.05 % ophthalmic emulsion Place 1 drop into both eyes daily as needed.     . diltiazem (CARDIZEM CD) 180 MG 24 hr capsule Take 1 capsule (180 mg total) by mouth daily. 90 capsule 3  . ferrous sulfate 325 (65 FE) MG tablet Take 325 mg by mouth daily with breakfast.    . furosemide (LASIX) 20 MG tablet TAKE ONE TABLET DAILY , MAY TAKE AN ADDITIONAL EXTRA TABLET DAILY IF NEEDED FOR SWELLING 45 tablet 11  . hydrocortisone (ANUSOL-HC) 2.5 % rectal cream Place 1 application rectally 2 (two) times daily. Please give pt applicators. Thank you. 30 g 1  . loperamide (IMODIUM A-D) 2 MG tablet Take 2 mg by mouth 4 (four) times daily  as needed for diarrhea or loose stools.    . nitroGLYCERIN (NITROSTAT) 0.4 MG SL tablet Place 1 tablet (0.4 mg total) under the tongue every 5 (five) minutes as needed for chest pain. 25 tablet 3  . rosuvastatin (CRESTOR) 20 MG tablet Take 1 tablet (20 mg total) by mouth daily. 91 tablet 3  . topiramate (TOPAMAX) 200 MG tablet Take 1 tablet by mouth daily.  2   No current facility-administered medications for this visit.     Objective: BP 136/70 (BP Location: Left Arm, Patient Position: Sitting, Cuff Size: Large)   Pulse (!) 58   Temp 98.3 F (36.8 C) (Oral)   Ht 5\' 9"  (1.753 m)   Wt 192 lb (87.1 kg)   SpO2 97%   BMI 28.35 kg/m  Gen: NAD, resting comfortably CV: RRR- appears to be in sinus. Slight  systolic murmur Lungs: CTAB no crackles, wheeze, rhonchi Abdomen: soft/nontender/nondistended/normal bowel sounds. No rebound or guarding.  Ext: 1+ edema- slightly better as wearing compression stockings today Skin: warm, dry  Assessment/Plan:  Hypertension S: controlled on lasix 20mg  and dilitazem 180mg  XR. Home #s still mainly 120s and 130s BP Readings from Last 3 Encounters:  05/25/17 136/70  04/13/17 120/66  03/23/17 (!) 144/70  A/P: We discussed blood pressure goal of <140/90. Continue current meds  Hyperglycemia- noted cbg 100 last viist- will check a1c. Discussed regular exercise which he has been misisng  Iron deficiency anemia- update labs. Takes iron daily.   Chronic diastolic heart failure (HCC) S: maintains lasix 20mg  daily. Low salt diet. Weight down 1 lb from June visit with [place]. No worsening edema- stable at 1+- likely venous insufficiency related.   Mild aortic stenosis also noted on echocardiogram.  A/P: continue current medications. Wearing compression stockings OTC for venous insufficiency element.   Paroxysmal atrial fibrillation ([place]); CHA2DSVasc - 4; Now on Eliquis S: on eliquis for anticoagulation and diltiazem for rate control. Has been in sinus for 2 years thankfully. plavix for CAD history. Takes iron for mild anemia- declines updating colonoscopy  24 hour monitor last visit as was experiencing some palpitations- symptoms resolved and never wore monitor A/P: continue current medications   Future Appointments Date Time Provider [place]  06/07/2017 9:30 AM [facility] NURSE [facility] None  03/24/2018 10:00 AM Williemae Area, RN [facility] None   4 months  Orders Placed This Encounter  Procedures  . CBC    [place]  . Comprehensive metabolic panel    [place]  . Hemoglobin A1c    [place]  . Iron, TIBC and Ferritin Panel   Return precautions advised.  [name], MD

## 2017-05-25 NOTE — Assessment & Plan Note (Signed)
S: maintains lasix 20mg  daily. Low salt diet. Weight down 1 lb from June visit with [place]. No worsening edema- stable at 1+- likely venous insufficiency related.   Mild aortic stenosis also noted on echocardiogram.  A/P: continue current medications. Wearing compression stockings OTC for venous insufficiency element.

## 2017-05-26 LAB — IRON,TIBC AND FERRITIN PANEL
%SAT: 48 % (calc) (ref 15–60)
Ferritin: 106 ng/mL (ref 20–380)
IRON: 98 ug/dL (ref 50–180)
TIBC: 203 mcg/dL (calc) — ABNORMAL LOW (ref 250–425)

## 2017-05-27 ENCOUNTER — Telehealth: Payer: Self-pay | Admitting: Family Medicine

## 2017-05-27 NOTE — Telephone Encounter (Signed)
Spoke with patient and reviewed lab results. 

## 2017-05-27 NOTE — Telephone Encounter (Signed)
Patient returning call for lab results. 

## 2017-05-28 NOTE — Telephone Encounter (Signed)
Patient has f/u question about lab results. Would like a call back.  Ty,  -LL

## 2017-05-28 NOTE — Telephone Encounter (Signed)
Called and reviewed over lab results again with patient. He verbalized understanding.

## 2017-06-07 ENCOUNTER — Ambulatory Visit (INDEPENDENT_AMBULATORY_CARE_PROVIDER_SITE_OTHER): Payer: Medicare Other | Admitting: Family Medicine

## 2017-06-07 ENCOUNTER — Encounter: Payer: Self-pay | Admitting: Family Medicine

## 2017-06-07 DIAGNOSIS — I200 Unstable angina: Secondary | ICD-10-CM

## 2017-06-07 DIAGNOSIS — E538 Deficiency of other specified B group vitamins: Secondary | ICD-10-CM | POA: Diagnosis not present

## 2017-06-07 MED ORDER — CYANOCOBALAMIN 1000 MCG/ML IJ SOLN
1000.0000 ug | Freq: Once | INTRAMUSCULAR | Status: AC
  Administered 2017-06-07: 1000 ug via INTRAMUSCULAR

## 2017-06-07 NOTE — Progress Notes (Signed)
Pt tolerated B12 injection well.   Monthly b12 injection for low b12.   I have reviewed and agree with note, evaluation, plan.   [name], MD

## 2017-06-09 ENCOUNTER — Other Ambulatory Visit: Payer: Self-pay | Admitting: Family Medicine

## 2017-06-24 ENCOUNTER — Other Ambulatory Visit: Payer: Self-pay | Admitting: Cardiology

## 2017-06-24 DIAGNOSIS — Z7901 Long term (current) use of anticoagulants: Secondary | ICD-10-CM

## 2017-06-24 DIAGNOSIS — Z9861 Coronary angioplasty status: Principal | ICD-10-CM

## 2017-06-24 DIAGNOSIS — I2510 Atherosclerotic heart disease of native coronary artery without angina pectoris: Secondary | ICD-10-CM

## 2017-07-12 ENCOUNTER — Ambulatory Visit (INDEPENDENT_AMBULATORY_CARE_PROVIDER_SITE_OTHER): Payer: Medicare Other | Admitting: Family Medicine

## 2017-07-12 DIAGNOSIS — E538 Deficiency of other specified B group vitamins: Secondary | ICD-10-CM | POA: Diagnosis not present

## 2017-07-12 MED ORDER — CYANOCOBALAMIN 1000 MCG/ML IJ SOLN
1000.0000 ug | Freq: Once | INTRAMUSCULAR | Status: AC
  Administered 2017-07-12: 1000 ug via INTRAMUSCULAR

## 2017-07-12 NOTE — Progress Notes (Signed)
Cyanocobalamin 1,000 mcg/mL, 1 mL given IM, LT deltoid, mfg: [employer], lot#: [id number], exp: 03/19, ndc: 21115-520-80. Pt tolerated injection well.

## 2017-07-12 NOTE — Progress Notes (Signed)
I have reviewed and agree with note, evaluation, plan. Monthly injections for low b12  [name], MD

## 2017-08-16 ENCOUNTER — Ambulatory Visit (INDEPENDENT_AMBULATORY_CARE_PROVIDER_SITE_OTHER): Payer: Medicare Other

## 2017-08-16 DIAGNOSIS — E538 Deficiency of other specified B group vitamins: Secondary | ICD-10-CM

## 2017-08-16 MED ORDER — CYANOCOBALAMIN 1000 MCG/ML IJ SOLN
1000.0000 ug | Freq: Once | INTRAMUSCULAR | Status: AC
  Administered 2017-08-16: 1000 ug via INTRAMUSCULAR

## 2017-08-16 NOTE — Progress Notes (Signed)
Cyanococalamin 1000 mcg/mL, 1 mL given IM, RT deltoid, mfg: [employer], lot#: [id number], exp: 03/19, ndc: 71245-809-98, pt tolerated well and will f/u in 5 weeks for next B12 injection.

## 2017-08-16 NOTE — Progress Notes (Signed)
I have reviewed and agree with note, evaluation, plan.   Toney Difatta, MD  

## 2017-09-07 ENCOUNTER — Telehealth: Payer: Self-pay | Admitting: *Deleted

## 2017-09-07 NOTE — Telephone Encounter (Signed)
   [facility] Pre-operative Risk Assessment    Request for surgical clearance:  1. What type of surgery is being performed?  INJECTION   2. When is this surgery scheduled? 09/16/2017   3. What type of clearance is required (medical clearance vs. Pharmacy clearance to hold med vs. Both)? "Requesting medical clearance for patient to come off Eliquis for 3 days before an injection"  4. Are there any medications that need to be held prior to surgery and how long? Eliquis--3 days prior   5. Practice name and name of physician performing surgery? [facility], request is from [employer], no doctor is listed on clearance   6. What is your office phone and fax number? [place]: [phone] ext: [phone], FAX: [phone]   7. Anesthesia type (None, local, MAC, general) ? None listed.   Rodman Key 09/07/2017, 2:24 PM  _________________________________________________________________   (provider comments below)

## 2017-09-07 NOTE — Telephone Encounter (Signed)
Patient with diagnosis of Afib on Eliquis for anticoagulation.    Procedure: spinal injection Date of procedure: 09/16/17  CHADS2-VASc score of  4 (CHF, HTN, AGE, DM2, stroke/tia x 2, CAD, AGE, male)  CrCl 50ml/min  Per office protocol, patient can hold Eliquis for 4 days prior to procedure.

## 2017-09-08 NOTE — Telephone Encounter (Signed)
  Pre-operative Risk Assessment - Provider Statement    Patient ID:  [name], DOB: 08/14/1929, MRN: [id number]   Mr. Scantlin's chart has been reviewed for pre-operative risk assessment.  Chart reviewed by [facility].    Letter providing recommendations for anticoagulation was faxed to surgeon's office.  Please contact the surgeon's office to ensure it was received.  Signed,  Richardson Dopp, PA-C  09/08/2017 4:58 PM

## 2017-09-16 DIAGNOSIS — M5136 Other intervertebral disc degeneration, lumbar region: Secondary | ICD-10-CM | POA: Diagnosis not present

## 2017-09-20 ENCOUNTER — Ambulatory Visit (INDEPENDENT_AMBULATORY_CARE_PROVIDER_SITE_OTHER): Payer: Medicare Other | Admitting: Surgical

## 2017-09-20 ENCOUNTER — Encounter: Payer: Self-pay | Admitting: Surgical

## 2017-09-20 DIAGNOSIS — E538 Deficiency of other specified B group vitamins: Secondary | ICD-10-CM

## 2017-09-20 MED ORDER — CYANOCOBALAMIN 1000 MCG/ML IJ SOLN
1000.0000 ug | Freq: Once | INTRAMUSCULAR | Status: AC
  Administered 2017-09-20: 1000 ug via INTRAMUSCULAR

## 2017-09-20 NOTE — Progress Notes (Signed)
Patient comes in today for his B 12 injection. B 12 given in left deltoid. Patient tolerated well. Patient will schedule for next injection.

## 2017-09-20 NOTE — Progress Notes (Signed)
I have reviewed and agree with note, evaluation, plan.   Alejandrina Raimer, MD  

## 2017-09-27 ENCOUNTER — Encounter: Payer: Self-pay | Admitting: Family Medicine

## 2017-09-27 ENCOUNTER — Ambulatory Visit (INDEPENDENT_AMBULATORY_CARE_PROVIDER_SITE_OTHER): Payer: Medicare Other | Admitting: Family Medicine

## 2017-09-27 VITALS — BP 136/72 | HR 67 | Temp 97.7°F | Ht 69.0 in | Wt 187.8 lb

## 2017-09-27 DIAGNOSIS — N183 Chronic kidney disease, stage 3 unspecified: Secondary | ICD-10-CM

## 2017-09-27 DIAGNOSIS — B9689 Other specified bacterial agents as the cause of diseases classified elsewhere: Secondary | ICD-10-CM

## 2017-09-27 DIAGNOSIS — I5032 Chronic diastolic (congestive) heart failure: Secondary | ICD-10-CM | POA: Diagnosis not present

## 2017-09-27 DIAGNOSIS — I480 Paroxysmal atrial fibrillation: Secondary | ICD-10-CM

## 2017-09-27 DIAGNOSIS — R739 Hyperglycemia, unspecified: Secondary | ICD-10-CM | POA: Diagnosis not present

## 2017-09-27 DIAGNOSIS — I10 Essential (primary) hypertension: Secondary | ICD-10-CM | POA: Diagnosis not present

## 2017-09-27 DIAGNOSIS — J329 Chronic sinusitis, unspecified: Secondary | ICD-10-CM | POA: Diagnosis not present

## 2017-09-27 DIAGNOSIS — E785 Hyperlipidemia, unspecified: Secondary | ICD-10-CM

## 2017-09-27 DIAGNOSIS — I779 Disorder of arteries and arterioles, unspecified: Secondary | ICD-10-CM

## 2017-09-27 DIAGNOSIS — I739 Peripheral vascular disease, unspecified: Secondary | ICD-10-CM

## 2017-09-27 MED ORDER — AMOXICILLIN-POT CLAVULANATE 875-125 MG PO TABS: 1.0000 | ORAL_TABLET | Freq: Two times a day (BID) | ORAL | 0 refills | Status: AC

## 2017-09-27 NOTE — Patient Instructions (Addendum)
[email or web address]  Schedule a lab visit at the check out desk within 2 weeks. Return for future fasting labs meaning nothing but water after midnight please. Ok to take your medications with water.   Use augmentin for 7 days- take with food. I think this is a bacterial sinus infection given duration of symptoms. If no better in 10 days return to see [place].   4-6 month follow up- if weight continues to go down without you trying we may need to consider further evaluations such as colonoscopy

## 2017-09-27 NOTE — Assessment & Plan Note (Signed)
If patient has not seen cardiology by follow up will gently remind him again. Last carotid duplex 04/2016 and is due for repeat with 6-79% stenosis RICA

## 2017-09-27 NOTE — Assessment & Plan Note (Signed)
S:  Increased risk of diabetes with a1c at 6.0 last visit. He is down 5 lbs- states not intentionally trying to lose (is on different scales here). Has lost weight on home scales as well.  Lab Results  Component Value Date   HGBA1C 6.0 05/25/2017  A/P: will update a1c with labs. Will need to monitor weight trend. We discussed doing more advanced investigation if continues to lose weight.

## 2017-09-27 NOTE — Assessment & Plan Note (Signed)
S: patient with GFR right near border of III and IV. Follows with Dr. Moshe Cipro. Knows to avoid nsaids.  A/P: continue BP and lipid control- continue [place] kidney follow up- was told stable at last check

## 2017-09-27 NOTE — Assessment & Plan Note (Signed)
S: remains on eliquis 2.5 mg BID for anticoagulation and dilitaizem for rate control. Has been in sinus as far as we know for last 2 years. Also has to be on plavix due to CAD.  A/P: continue current medications. Doing well- appears (although cannot prove without ekg) that he is in sinus rhythm

## 2017-09-27 NOTE — Assessment & Plan Note (Signed)
S: controlled on lasix 20mg , diltiazem 180mg  XR. Home #s in range 130s and 140s BP Readings from Last 3 Encounters:  09/27/17 136/72  05/25/17 136/70  04/13/17 120/66  A/P: We discussed blood pressure goal of <140/90.  Would prefer tighter control at home but with in office #s in range -Continue current meds

## 2017-09-27 NOTE — Assessment & Plan Note (Signed)
S: weight down 5 lbs from last visit. Low salt diet. On 20mg  lasix daily. Stable edema at 1+ (some venous insufficiency). Continues compression stockings  Also has mild aortic stenosis.  A/P: appears to be stale today- continue to monitor. As per hyperglycemia notes- consider further workup if continues to lose weight

## 2017-09-27 NOTE — Progress Notes (Signed)
Subjective:  Mark Jackson. is a 84 y.o. year old very pleasant male patient who presents for/with See problem oriented charting ROS- denies palpitations or chest pain. No fever or chills.    Past Medical History-  Patient Active Problem List   Diagnosis Date Noted  . Crescendo angina ([place]) 06/01/2016    Priority: High  . CAD S/P DES PCI to proximal LAD 03/22/2015    Priority: High  . Chronic diastolic heart failure ([place]) 08/23/2014    Priority: High  . Paroxysmal atrial fibrillation ([place]); CHA2DSVasc - 4; Now on Eliquis 08/20/2014    Priority: High    Class: Diagnosis of  . CKD (chronic kidney disease) stage 3, GFR 30-59 ml/min (HCC) 08/20/2014    Priority: High  . PROSTATE CANCER, HX OF 10/22/2008    Priority: High  . BPH associated with nocturia 06/15/2016    Priority: Medium  . Migraine     Priority: Medium  . B12 deficiency     Priority: Medium  . Aortic stenosis 03/14/2015    Priority: Medium  . Hereditary and idiopathic peripheral neuropathy 01/12/2014    Priority: Medium  . H/O syncope 09/03/2013    Priority: Medium  . Right-sided carotid artery disease; followed by Dr. Ellyn Hack 03/02/2013    Priority: Medium  . Hyperlipidemia with target LDL less than 70 03/02/2013    Priority: Medium  . ANEMIA 10/23/2008    Priority: Medium  . Essential hypertension 10/22/2008    Priority: Medium  . Sleep apnea 10/22/2008    Priority: Medium  . Personal history of colonic polyps 10/22/2008    Priority: Medium  . Chronic diarrhea     Priority: Low  . Perianal dermatitis 06/19/2015    Priority: Low  . Rectal bleeding 04/25/2015    Priority: Low  . Abnormal finding on EKG 08/31/2014    Priority: Low  . Dyspnea 08/31/2014    Priority: Low  . Long term current use of anticoagulant therapy 08/27/2014    Priority: Low  . Obesity (BMI 30-39.9) 09/03/2013    Priority: Low  . GLAUCOMA 10/23/2008    Priority: Low  . HEMORRHOIDS 10/22/2008    Priority: Low  . ARTHRITIS  10/22/2008    Priority: Low  . Hyperglycemia 09/27/2017  . Bilateral lower extremity edema 04/15/2017    Medications- reviewed and updated Current Outpatient Medications  Medication Sig Dispense Refill  . Ca Carbonate-Mag Hydroxide (ROLAIDS PO) Take 1 tablet by mouth daily as needed (acid reflux).    . clopidogrel (PLAVIX) 75 MG tablet TAKE 1 TABLET EVERY DAY WITH BREAKFAST. 60 tablet 11  . cycloSPORINE (RESTASIS) 0.05 % ophthalmic emulsion Place 1 drop into both eyes daily as needed.     . diltiazem (CARDIZEM CD) 180 MG 24 hr capsule Take 1 capsule (180 mg total) by mouth daily. 90 capsule 3  . ELIQUIS 2.5 MG TABS tablet TAKE 1 TABLET BY MOUTH TWICE DAILY. 60 tablet 5  . ferrous sulfate 325 (65 FE) MG tablet Take 325 mg by mouth daily with breakfast.    . furosemide (LASIX) 20 MG tablet TAKE ONE TABLET DAILY , MAY TAKE AN ADDITIONAL EXTRA TABLET DAILY IF NEEDED FOR SWELLING 45 tablet 11  . hydrocortisone (ANUSOL-HC) 2.5 % rectal cream Place 1 application rectally 2 (two) times daily. Please give pt applicators. Thank you. 30 g 1  . loperamide (IMODIUM A-D) 2 MG tablet Take 2 mg by mouth 4 (four) times daily as needed for diarrhea or loose stools.    Marland Kitchen  nitroGLYCERIN (NITROSTAT) 0.4 MG SL tablet Place 1 tablet (0.4 mg total) under the tongue every 5 (five) minutes as needed for chest pain. 25 tablet 3  . rosuvastatin (CRESTOR) 20 MG tablet TAKE 1 TABLET ONCE DAILY. 90 tablet 3  . topiramate (TOPAMAX) 200 MG tablet Take 1 tablet by mouth daily.  2   No current facility-administered medications for this visit.     Objective: BP 136/72 (BP Location: Left Arm, Patient Position: Sitting, Cuff Size: Large)   Pulse 67   Temp 97.7 F (36.5 C) (Oral)   Ht 5\' 9"  (1.753 m)   Wt 187 lb 12.8 oz (85.2 kg)   SpO2 99%   BMI 27.73 kg/m  Gen: NAD, resting comfortably Tender sinuses, some clear discharge in both nares with erythematous turbinates, some signs of drainage in pharynx.  CV: RRR 3/6 SEM   Lungs: CTAB no crackles, wheeze, rhonchi Abdomen: soft/nontender/nondistended/normal bowel sounds. No rebound or guarding.  Ext: 1+  Edema under compression stockings Skin: warm, dry, no rash  Assessment/Plan:  Other notes 1. Iron deficiency anemia- on iron daily. He has declined updating colonoscopy due to age. Last ferritin over 100 2. Remains on topamax through neurology 3. Remains on crestor 20mg  - one day too early for lipids 4. Some tinnitus- discussed likely hearing loss related in past  Bacterial sinusitis S: started in December with sinus congestion, sinus pressure. No fever or chills. Still some cough.   Both ears feel itchy and full at times- left is worse than right A/P: treat with aumgentin for bacterial sinusitis. In addition- use mineral oil for dry ear canals and to help with lingering wax  CKD (chronic kidney disease) stage 3, GFR 30-59 ml/min S: patient with GFR right near border of III and IV. Follows with Dr. Moshe Cipro. Knows to avoid nsaids.  A/P: continue BP and lipid control- continue [place] kidney follow up- was told stable at last check  Essential hypertension S: controlled on lasix 20mg , diltiazem 180mg  XR. Home #s in range 130s and 140s BP Readings from Last 3 Encounters:  09/27/17 136/72  05/25/17 136/70  04/13/17 120/66  A/P: We discussed blood pressure goal of <140/90.  Would prefer tighter control at home but with in office #s in range -Continue current meds  Hyperglycemia S:  Increased risk of diabetes with a1c at 6.0 last visit. He is down 5 lbs- states not intentionally trying to lose (is on different scales here). Has lost weight on home scales as well.  Lab Results  Component Value Date   HGBA1C 6.0 05/25/2017  A/P: will update a1c with labs. Will need to monitor weight trend. We discussed doing more advanced investigation if continues to lose weight.   Chronic diastolic heart failure (HCC) S: weight down 5 lbs from last visit. Low  salt diet. On 20mg  lasix daily. Stable edema at 1+ (some venous insufficiency). Continues compression stockings  Also has mild aortic stenosis.  A/P: appears to be stale today- continue to monitor. As per hyperglycemia notes- consider further workup if continues to lose weight  Paroxysmal atrial fibrillation ([place]); CHA2DSVasc - 4; Now on Eliquis S: remains on eliquis 2.5 mg BID for anticoagulation and dilitaizem for rate control. Has been in sinus as far as we know for last 2 years. Also has to be on plavix due to CAD.  A/P: continue current medications. Doing well- appears (although cannot prove without ekg) that he is in sinus rhythm   Future Appointments  Date Time Provider Department  Center  10/11/2017  8:15 AM [facility] LAB [facility]  10/25/2017  9:00 AM [facility] NURSE [facility]  01/25/2018  9:00 AM [name], MD [facility]  03/24/2018 10:00 AM Williemae Area, RN [facility]   Return in about 4 months (around 01/25/2018) for follow up- or sooner if needed.  Lab/Order associations: Essential hypertension - Plan: Lipid panel, CBC, Comprehensive metabolic panel  Hyperlipidemia with target LDL less than 70 - Plan: Lipid panel, CBC, Comprehensive metabolic panel  Hyperglycemia - Plan: Hemoglobin A1c  Paroxysmal atrial fibrillation ([place]); CHA2DSVasc - 4; Now on Eliquis  Meds ordered this encounter  Medications  . amoxicillin-clavulanate (AUGMENTIN) 875-125 MG tablet    Sig: Take 1 tablet by mouth 2 (two) times daily for 7 days.    Dispense:  14 tablet    Refill:  0    Return precautions advised.  [name], MD

## 2017-10-11 ENCOUNTER — Other Ambulatory Visit (INDEPENDENT_AMBULATORY_CARE_PROVIDER_SITE_OTHER): Payer: Medicare Other

## 2017-10-11 DIAGNOSIS — R739 Hyperglycemia, unspecified: Secondary | ICD-10-CM

## 2017-10-11 DIAGNOSIS — E785 Hyperlipidemia, unspecified: Secondary | ICD-10-CM | POA: Diagnosis not present

## 2017-10-11 DIAGNOSIS — I10 Essential (primary) hypertension: Secondary | ICD-10-CM | POA: Diagnosis not present

## 2017-10-11 LAB — LIPID PANEL
CHOL/HDL RATIO: 2
CHOLESTEROL: 98 mg/dL (ref 0–200)
HDL: 41.2 mg/dL (ref >39.00)
LDL CALC: 48 mg/dL (ref 0–99)
NONHDL: 57.16
Triglycerides: 47 mg/dL (ref 0.0–149.0)
VLDL: 9.4 mg/dL (ref 0.0–40.0)

## 2017-10-11 LAB — COMPREHENSIVE METABOLIC PANEL
ALT: 10 U/L (ref 0–53)
AST: 15 U/L (ref 0–37)
Albumin: 3.2 g/dL — ABNORMAL LOW (ref 3.5–5.2)
Alkaline Phosphatase: 44 U/L (ref 39–117)
BUN: 40 mg/dL — ABNORMAL HIGH (ref 6–23)
CALCIUM: 8.8 mg/dL (ref 8.4–10.5)
CHLORIDE: 112 meq/L (ref 96–112)
CO2: 23 meq/L (ref 19–32)
Creatinine, Ser: 2 mg/dL — ABNORMAL HIGH (ref 0.40–1.50)
GFR: 33.9 mL/min — ABNORMAL LOW (ref >60.00)
Glucose, Bld: 100 mg/dL — ABNORMAL HIGH (ref 70–99)
POTASSIUM: 4.2 meq/L (ref 3.5–5.1)
SODIUM: 142 meq/L (ref 135–145)
Total Bilirubin: 0.4 mg/dL (ref 0.2–1.2)
Total Protein: 6.4 g/dL (ref 6.0–8.3)

## 2017-10-11 LAB — CBC
HEMATOCRIT: 32.8 % — AB (ref 39.0–52.0)
HEMOGLOBIN: 11.2 g/dL — AB (ref 13.0–17.0)
MCHC: 34.1 g/dL (ref 30.0–36.0)
MCV: 97.7 fl (ref 78.0–100.0)
Platelets: 257 10*3/uL (ref 150.0–400.0)
RBC: 3.36 Mil/uL — AB (ref 4.22–5.81)
RDW: 13.3 % (ref 11.5–15.5)
WBC: 8.5 10*3/uL (ref 4.0–10.5)

## 2017-10-11 LAB — HEMOGLOBIN A1C: Hgb A1c MFr Bld: 6.2 % (ref 4.6–6.5)

## 2017-10-18 DIAGNOSIS — D485 Neoplasm of uncertain behavior of skin: Secondary | ICD-10-CM | POA: Diagnosis not present

## 2017-10-18 DIAGNOSIS — Z85828 Personal history of other malignant neoplasm of skin: Secondary | ICD-10-CM | POA: Diagnosis not present

## 2017-10-18 DIAGNOSIS — L565 Disseminated superficial actinic porokeratosis (DSAP): Secondary | ICD-10-CM | POA: Diagnosis not present

## 2017-10-18 DIAGNOSIS — L570 Actinic keratosis: Secondary | ICD-10-CM | POA: Diagnosis not present

## 2017-10-18 DIAGNOSIS — L821 Other seborrheic keratosis: Secondary | ICD-10-CM | POA: Diagnosis not present

## 2017-10-18 DIAGNOSIS — L820 Inflamed seborrheic keratosis: Secondary | ICD-10-CM | POA: Diagnosis not present

## 2017-10-23 DIAGNOSIS — M47816 Spondylosis without myelopathy or radiculopathy, lumbar region: Secondary | ICD-10-CM | POA: Diagnosis not present

## 2017-10-23 DIAGNOSIS — M5136 Other intervertebral disc degeneration, lumbar region: Secondary | ICD-10-CM | POA: Diagnosis not present

## 2017-10-25 ENCOUNTER — Ambulatory Visit (INDEPENDENT_AMBULATORY_CARE_PROVIDER_SITE_OTHER): Payer: Medicare Other

## 2017-10-25 DIAGNOSIS — E538 Deficiency of other specified B group vitamins: Secondary | ICD-10-CM | POA: Diagnosis not present

## 2017-10-25 MED ORDER — CYANOCOBALAMIN 1000 MCG/ML IJ SOLN
1000.0000 ug | Freq: Once | INTRAMUSCULAR | Status: AC
  Administered 2017-10-25: 1000 ug via INTRAMUSCULAR

## 2017-10-25 NOTE — Progress Notes (Signed)
Patient in today for B12 injection due to B12 deficiency. Tolerated injection well in right arm.

## 2017-10-25 NOTE — Progress Notes (Signed)
I have reviewed the patient's encounter and agree with the documentation.  Algis Greenhouse. Jerline Pain, MD 10/25/2017 9:36 AM

## 2017-11-01 ENCOUNTER — Other Ambulatory Visit: Payer: Self-pay | Admitting: Physician Assistant

## 2017-11-02 NOTE — Telephone Encounter (Signed)
Rx has been sent to the pharmacy electronically. ° °

## 2017-11-02 NOTE — Telephone Encounter (Signed)
Refill Request.  

## 2017-11-08 ENCOUNTER — Encounter: Payer: Self-pay | Admitting: Cardiology

## 2017-11-08 ENCOUNTER — Ambulatory Visit (INDEPENDENT_AMBULATORY_CARE_PROVIDER_SITE_OTHER): Payer: Medicare Other | Admitting: Cardiology

## 2017-11-08 VITALS — BP 147/71 | HR 81 | Ht 69.0 in | Wt 186.0 lb

## 2017-11-08 DIAGNOSIS — R600 Localized edema: Secondary | ICD-10-CM

## 2017-11-08 DIAGNOSIS — E785 Hyperlipidemia, unspecified: Secondary | ICD-10-CM

## 2017-11-08 DIAGNOSIS — I350 Nonrheumatic aortic (valve) stenosis: Secondary | ICD-10-CM | POA: Diagnosis not present

## 2017-11-08 DIAGNOSIS — R5382 Chronic fatigue, unspecified: Secondary | ICD-10-CM | POA: Diagnosis not present

## 2017-11-08 DIAGNOSIS — I2510 Atherosclerotic heart disease of native coronary artery without angina pectoris: Secondary | ICD-10-CM | POA: Diagnosis not present

## 2017-11-08 DIAGNOSIS — I480 Paroxysmal atrial fibrillation: Secondary | ICD-10-CM

## 2017-11-08 DIAGNOSIS — I739 Peripheral vascular disease, unspecified: Secondary | ICD-10-CM

## 2017-11-08 DIAGNOSIS — Z9861 Coronary angioplasty status: Secondary | ICD-10-CM

## 2017-11-08 DIAGNOSIS — I10 Essential (primary) hypertension: Secondary | ICD-10-CM

## 2017-11-08 DIAGNOSIS — I779 Disorder of arteries and arterioles, unspecified: Secondary | ICD-10-CM

## 2017-11-08 DIAGNOSIS — Z87898 Personal history of other specified conditions: Secondary | ICD-10-CM

## 2017-11-08 DIAGNOSIS — I5032 Chronic diastolic (congestive) heart failure: Secondary | ICD-10-CM | POA: Diagnosis not present

## 2017-11-08 DIAGNOSIS — R5383 Other fatigue: Secondary | ICD-10-CM | POA: Insufficient documentation

## 2017-11-08 MED ORDER — ROSUVASTATIN CALCIUM 20 MG PO TABS: 10.0000 mg | ORAL_TABLET | Freq: Every day | ORAL | 3 refills | Status: DC

## 2017-11-08 NOTE — Patient Instructions (Addendum)
Medication Instructions:  Decrease Rosuvastatin 10 mg at bedtime (1/2 tablet of 20 mg)   Testing/Procedures: Will schedule at [facility] has requested that you have an echocardiogram. Echocardiography is a painless test that uses sound waves to create images of your heart. It provides your doctor with information about the size and shape of your heart and how well your heart's chambers and valves are working. This procedure takes approximately one hour. There are no restrictions for this procedure.    Follow-Up: Your physician recommends that you schedule a follow-up appointment in: 2 months with Dr. Ellyn Hack    If you need a refill on your cardiac medications before your next appointment, please call your pharmacy.
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
## 2017-11-09 ENCOUNTER — Encounter: Payer: Self-pay | Admitting: Cardiology

## 2017-11-09 NOTE — Assessment & Plan Note (Signed)
Mild aortic stenosis by prior echo in 2016.  Otherwise would not be overly concerned with this, but he does still have a persistent murmur and has a recent syncopal episode that we need to exclude worsening aortic stenosis. Plan: Recheck 2D echo

## 2017-11-09 NOTE — Assessment & Plan Note (Addendum)
He probably does have venous stasis.  Does not otherwise seem like it is related to CHF. \ Plan: Continue support stockings and low-dose Lasix.  Also foot elevation.  Would prefer to avoid dehydrating him with extra doses of Lasix.

## 2017-11-09 NOTE — Assessment & Plan Note (Signed)
Last carotid ultrasound was 2017.  We had discussed this during clinic, but probably in light of his recent syncopal episode, we should order carotid Dopplers as well prior to follow-up.

## 2017-11-09 NOTE — Assessment & Plan Note (Addendum)
Still doing well without any further anginal symptoms.  Really his last PCI took care of the pressing issues there. He is now on Plavix without aspirin as he is on low-dose apixaban.  I thought about possibly stopping Plavix as opposed to aspirin, however he has an ostial LAD stent and I would prefer to keep him on full antiplatelet coverage.  This could be stopped for procedures and can be stopped if he has significant bleeding.  He is on  Diltiazem at 180 mg daily as opposed to beta-blocker because of fatigue issues.  This seems to be doing well for him.  He is also on statin which we are going to reduce the dose of.

## 2017-11-09 NOTE — Assessment & Plan Note (Signed)
His Crestor dose has been increased from 5-> 20 mg after his most recent stent.  I will reduce his  With his ongoing fatigue and his LDL now being 48, Crestor back down to 10 mg daily.

## 2017-11-09 NOTE — Assessment & Plan Note (Signed)
This is not the first time he had an episode like this, unfortunately is really hard to tell what the cause was.  He had no antecedent symptoms to suggest an arrhythmia either angina or sensation of rapid heartbeat.  My suspicion is that he probably has been working hard standing on his feet a long time, and was somewhat dehydrated and probably got orthostatic when he was walking to his car.  Continue adequate hydration, support stockings and allowing for permissive hypertension. Check 2D echo because of aortic stenosis, and carotid Dopplers because of right carotid stenosis.

## 2017-11-09 NOTE — Assessment & Plan Note (Addendum)
Really trying to allow for some mild permissive hypertension if necessary to avoid orthostatic hypotension --> especially in light of his recent "syncopal episode"  Would not be more aggressive than we currently are with diltiazem.

## 2017-11-09 NOTE — Assessment & Plan Note (Signed)
Not really sure the true etiology of this besides him probably just being unaccustomed to "getting old".  I am going to back off on his Lipitor in order to see if this helps some.  Otherwise no real other options besides backing off on diltiazem.  He is not overly anemic

## 2017-11-09 NOTE — Assessment & Plan Note (Signed)
I am not sure if his weight being down is due to diuresis versus him simply losing weight.  He is on minimal dose of furosemide with well-controlled edema using support stockings.  I would not want to push his diuretic any further besides occasional as needed dosing. We are trying to allow for some mild permissive hypertension, especially in light of recent syncopal lipid event.  I would not push afterload reduction any further than he currently is with diltiazem.  (Since his ejection fraction is stable,  using diltiazem as opposed to beta-blocker is acceptable)

## 2017-11-09 NOTE — Assessment & Plan Note (Signed)
Seems rate controlled on diltiazem without any obvious breakthrough episodes.  Remains on Plavix plus low-dose Eliquis with no further bleeding episodes.  If he does have further bleeding, we may have to consider stopping either Eliquis or Plavix.

## 2017-11-11 DIAGNOSIS — M47816 Spondylosis without myelopathy or radiculopathy, lumbar region: Secondary | ICD-10-CM | POA: Diagnosis not present

## 2017-11-24 DIAGNOSIS — R351 Nocturia: Secondary | ICD-10-CM | POA: Diagnosis not present

## 2017-11-24 DIAGNOSIS — R102 Pelvic and perineal pain: Secondary | ICD-10-CM | POA: Diagnosis not present

## 2017-11-24 DIAGNOSIS — Z8546 Personal history of malignant neoplasm of prostate: Secondary | ICD-10-CM | POA: Diagnosis not present

## 2017-11-29 ENCOUNTER — Ambulatory Visit (INDEPENDENT_AMBULATORY_CARE_PROVIDER_SITE_OTHER): Payer: Medicare Other

## 2017-11-29 DIAGNOSIS — E538 Deficiency of other specified B group vitamins: Secondary | ICD-10-CM

## 2017-11-29 MED ORDER — CYANOCOBALAMIN 1000 MCG/ML IJ SOLN
1000.0000 ug | Freq: Once | INTRAMUSCULAR | Status: AC
  Administered 2017-11-29: 1000 ug via INTRAMUSCULAR

## 2017-11-29 NOTE — Progress Notes (Signed)
I have reviewed and agree with note, evaluation, plan.   Stephen Hunter, MD  

## 2017-11-29 NOTE — Progress Notes (Signed)
Patient received vitamin B12 1000 mcg IM in left deltoid.  Tolerated without difficulty.  Will schedule next B12 injection before leaving office today.

## 2017-12-06 ENCOUNTER — Telehealth: Payer: Self-pay | Admitting: *Deleted

## 2017-12-06 DIAGNOSIS — R55 Syncope and collapse: Secondary | ICD-10-CM

## 2017-12-06 DIAGNOSIS — I779 Disorder of arteries and arterioles, unspecified: Secondary | ICD-10-CM

## 2017-12-06 DIAGNOSIS — I739 Peripheral vascular disease, unspecified: Principal | ICD-10-CM

## 2017-12-06 NOTE — Telephone Encounter (Signed)
Patient aware test  will be order. Dr Ellyn Hack talked to patient while patient was in office with wife at her scheduled visit

## 2017-12-08 ENCOUNTER — Ambulatory Visit (HOSPITAL_BASED_OUTPATIENT_CLINIC_OR_DEPARTMENT_OTHER): Payer: Medicare Other

## 2017-12-08 ENCOUNTER — Other Ambulatory Visit: Payer: Self-pay | Admitting: Cardiology

## 2017-12-08 ENCOUNTER — Other Ambulatory Visit: Payer: Self-pay

## 2017-12-08 ENCOUNTER — Ambulatory Visit (HOSPITAL_COMMUNITY)
Admission: RE | Admit: 2017-12-08 | Discharge: 2017-12-08 | Disposition: A | Discharge status: Home / Self Care | Payer: Medicare Other | Source: Ambulatory Visit | Attending: Cardiovascular Disease | Admitting: Cardiovascular Disease

## 2017-12-08 DIAGNOSIS — Z87891 Personal history of nicotine dependence: Secondary | ICD-10-CM | POA: Insufficient documentation

## 2017-12-08 DIAGNOSIS — R5382 Chronic fatigue, unspecified: Secondary | ICD-10-CM | POA: Diagnosis not present

## 2017-12-08 DIAGNOSIS — Z87898 Personal history of other specified conditions: Secondary | ICD-10-CM | POA: Diagnosis not present

## 2017-12-08 DIAGNOSIS — I6523 Occlusion and stenosis of bilateral carotid arteries: Secondary | ICD-10-CM

## 2017-12-08 DIAGNOSIS — R55 Syncope and collapse: Secondary | ICD-10-CM | POA: Insufficient documentation

## 2017-12-08 DIAGNOSIS — I4891 Unspecified atrial fibrillation: Secondary | ICD-10-CM | POA: Diagnosis not present

## 2017-12-08 DIAGNOSIS — N189 Chronic kidney disease, unspecified: Secondary | ICD-10-CM | POA: Insufficient documentation

## 2017-12-08 DIAGNOSIS — I350 Nonrheumatic aortic (valve) stenosis: Secondary | ICD-10-CM | POA: Insufficient documentation

## 2017-12-08 DIAGNOSIS — E669 Obesity, unspecified: Secondary | ICD-10-CM | POA: Diagnosis not present

## 2017-12-08 DIAGNOSIS — I2510 Atherosclerotic heart disease of native coronary artery without angina pectoris: Secondary | ICD-10-CM | POA: Diagnosis not present

## 2017-12-08 DIAGNOSIS — I129 Hypertensive chronic kidney disease with stage 1 through stage 4 chronic kidney disease, or unspecified chronic kidney disease: Secondary | ICD-10-CM | POA: Diagnosis not present

## 2017-12-08 DIAGNOSIS — E785 Hyperlipidemia, unspecified: Secondary | ICD-10-CM | POA: Diagnosis not present

## 2017-12-08 HISTORY — PX: TRANSTHORACIC ECHOCARDIOGRAM: SHX275

## 2017-12-09 ENCOUNTER — Telehealth: Payer: Self-pay | Admitting: *Deleted

## 2017-12-09 NOTE — Telephone Encounter (Addendum)
-----   Message from [name], MD sent at 12/08/2017  6:11 PM EDT ----- Echo result --> good news overall.  Looks normal at 55 to 60%.  Only grade 1 diastolic dysfunction which is better than expected.  The aortic valve does appear to have progressed to a moderate stenosis as compared to mild stenosis 3 years ago.  This means it is narrowed some since the last study, but not enough to cause symptoms.  Otherwise, the echocardiogram is relatively normal  [name], MD  Left message of results for pt

## 2017-12-24 ENCOUNTER — Other Ambulatory Visit: Payer: Self-pay | Admitting: Cardiology

## 2017-12-24 NOTE — Telephone Encounter (Signed)
Rx request sent to pharmacy.  

## 2017-12-28 ENCOUNTER — Other Ambulatory Visit: Payer: Self-pay | Admitting: Cardiology

## 2017-12-28 DIAGNOSIS — Z7901 Long term (current) use of anticoagulants: Secondary | ICD-10-CM

## 2017-12-28 DIAGNOSIS — Z9861 Coronary angioplasty status: Principal | ICD-10-CM

## 2017-12-28 DIAGNOSIS — I2510 Atherosclerotic heart disease of native coronary artery without angina pectoris: Secondary | ICD-10-CM

## 2018-01-04 ENCOUNTER — Ambulatory Visit: Payer: Medicare Other

## 2018-01-04 DIAGNOSIS — Z6828 Body mass index (BMI) 28.0-28.9, adult: Secondary | ICD-10-CM | POA: Diagnosis not present

## 2018-01-04 DIAGNOSIS — D631 Anemia in chronic kidney disease: Secondary | ICD-10-CM | POA: Diagnosis not present

## 2018-01-04 DIAGNOSIS — N183 Chronic kidney disease, stage 3 (moderate): Secondary | ICD-10-CM | POA: Diagnosis not present

## 2018-01-05 ENCOUNTER — Ambulatory Visit (INDEPENDENT_AMBULATORY_CARE_PROVIDER_SITE_OTHER): Payer: Medicare Other | Admitting: *Deleted

## 2018-01-05 ENCOUNTER — Encounter: Payer: Self-pay | Admitting: *Deleted

## 2018-01-05 DIAGNOSIS — E538 Deficiency of other specified B group vitamins: Secondary | ICD-10-CM

## 2018-01-05 MED ORDER — CYANOCOBALAMIN 1000 MCG/ML IJ SOLN
1000.0000 ug | Freq: Once | INTRAMUSCULAR | Status: AC
  Administered 2018-01-05: 1000 ug via INTRAMUSCULAR

## 2018-01-05 NOTE — Progress Notes (Signed)
Per orders of Dr.Hunter, injection of Vitamin b 12 given right deltoid by Anselmo Pickler, LPN Patient tolerated injection well. Pt told to return in 4 weeks for next B12 Injection. Pt verbalized understanding and will schedule on the way out.

## 2018-01-10 ENCOUNTER — Ambulatory Visit (INDEPENDENT_AMBULATORY_CARE_PROVIDER_SITE_OTHER): Payer: Medicare Other | Admitting: Cardiology

## 2018-01-10 ENCOUNTER — Encounter: Payer: Self-pay | Admitting: Cardiology

## 2018-01-10 VITALS — BP 146/76 | HR 63 | Ht 70.0 in | Wt 188.8 lb

## 2018-01-10 DIAGNOSIS — I480 Paroxysmal atrial fibrillation: Secondary | ICD-10-CM

## 2018-01-10 DIAGNOSIS — I779 Disorder of arteries and arterioles, unspecified: Secondary | ICD-10-CM | POA: Diagnosis not present

## 2018-01-10 DIAGNOSIS — Z87898 Personal history of other specified conditions: Secondary | ICD-10-CM | POA: Diagnosis not present

## 2018-01-10 DIAGNOSIS — E785 Hyperlipidemia, unspecified: Secondary | ICD-10-CM | POA: Diagnosis not present

## 2018-01-10 DIAGNOSIS — I2510 Atherosclerotic heart disease of native coronary artery without angina pectoris: Secondary | ICD-10-CM

## 2018-01-10 DIAGNOSIS — I739 Peripheral vascular disease, unspecified: Secondary | ICD-10-CM

## 2018-01-10 DIAGNOSIS — Z9861 Coronary angioplasty status: Secondary | ICD-10-CM

## 2018-01-10 DIAGNOSIS — I10 Essential (primary) hypertension: Secondary | ICD-10-CM

## 2018-01-10 DIAGNOSIS — I350 Nonrheumatic aortic (valve) stenosis: Secondary | ICD-10-CM

## 2018-01-10 DIAGNOSIS — I5032 Chronic diastolic (congestive) heart failure: Secondary | ICD-10-CM | POA: Diagnosis not present

## 2018-01-10 DIAGNOSIS — I6523 Occlusion and stenosis of bilateral carotid arteries: Secondary | ICD-10-CM

## 2018-01-10 DIAGNOSIS — R600 Localized edema: Secondary | ICD-10-CM | POA: Diagnosis not present

## 2018-01-10 DIAGNOSIS — R5382 Chronic fatigue, unspecified: Secondary | ICD-10-CM | POA: Diagnosis not present
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
## 2018-01-10 NOTE — Patient Instructions (Signed)
NO  MEDICATION CHANGES     Your physician wants you to follow-up in FEB 2020 [place]. You will receive a reminder letter in the mail two months in advance. If you don't receive a letter, please call our office to schedule the follow-up appointment.    If you need a refill on your cardiac medications before your next appointment, please call your pharmacy.

## 2018-01-11 MED ORDER — MIDAZOLAM HCL 2 MG/2ML IJ SOLN
INTRAMUSCULAR | Status: AC
  Filled 2018-01-11: qty 2

## 2018-01-11 MED ORDER — HEPARIN (PORCINE) IN NACL 1000-0.9 UT/500ML-% IV SOLN
INTRAVENOUS | Status: AC
  Filled 2018-01-11: qty 1000

## 2018-01-11 MED ORDER — HYDROMORPHONE HCL 1 MG/ML IJ SOLN
INTRAMUSCULAR | Status: AC
  Filled 2018-01-11: qty 0.5

## 2018-01-11 MED ORDER — LIDOCAINE HCL (PF) 1 % IJ SOLN
INTRAMUSCULAR | Status: AC
  Filled 2018-01-11: qty 30

## 2018-01-11 MED ORDER — HEPARIN SODIUM (PORCINE) 1000 UNIT/ML IJ SOLN
INTRAMUSCULAR | Status: AC
  Filled 2018-01-11: qty 1

## 2018-01-11 MED ORDER — NITROGLYCERIN 1 MG/10 ML FOR IR/CATH LAB
INTRA_ARTERIAL | Status: AC
  Filled 2018-01-11: qty 10

## 2018-01-12 ENCOUNTER — Encounter: Payer: Self-pay | Admitting: Cardiology

## 2018-01-12 NOTE — Assessment & Plan Note (Addendum)
Progression from mild to moderate.  Persistent murmur noted, still remains early to mid peaking.  Recheck echo in roughly 1 year.

## 2018-01-12 NOTE — Assessment & Plan Note (Signed)
On stable dose of diltiazem and Lasix.  We will titrate to further because of the syncope episode.  We have to elevate permissive hypertension despite having diastolic dysfunction.

## 2018-01-12 NOTE — Assessment & Plan Note (Signed)
No PND or orthopnea would argue against diastolic heart failure related swelling.  Continue current stating dose of Lasix with PRN dosing but avoid dehydration. With preserved EF, we are using diltiazem as opposed to beta-blocker especially with concerns of fatigue. Cautious with a dizziness, therefore we are not using afterload reducing agent such as ARB

## 2018-01-12 NOTE — Assessment & Plan Note (Signed)
Encouraged use of support hose as opposed to more frequent dosing of Lasix.  With elevation recommended and walking

## 2018-01-12 NOTE — Assessment & Plan Note (Signed)
Still no recurrent anginal symptoms. On diltiazem as opposed to beta-blocker to avoid fatigue (acceptable with preserved EF) On Plavix, but could be stopped for procedures or stop for significant bleeding/bruising -possibly is not better, is probably preferable to aspirin.

## 2018-01-12 NOTE — Assessment & Plan Note (Signed)
Stable echo.  Plan will be to recheck next spring. Continue current vascular risk reduction.

## 2018-01-12 NOTE — Assessment & Plan Note (Signed)
Using beta-blocker as opposed to beta-blocker. No benefit from holding statin. Symptoms notably improved since he has been getting better sleep.

## 2018-01-12 NOTE — Assessment & Plan Note (Signed)
He is back to 10 mg of Crestor, and his lipid panel looks great.  He held it for little while because of fatigue, did not as a difference.  Now back on statin.

## 2018-01-12 NOTE — Assessment & Plan Note (Signed)
Had an episode like this a long time.  I think it probably has something to do with him being fatigued, tired and probably little bit dehydrated.  It sounds  vasovagal to me complicated by orthostasis. We did back off on his diuretic and he is increasing his hydration..  Also the fact that he is now getting better sleep this should improve outcomes. Relatively normal stable carotids with no vertebral or basilar disease. Echo does show progression of aortic stenosis, not severe point that would be concerning with a syncope symptom.

## 2018-01-12 NOTE — Assessment & Plan Note (Signed)
On diltiazem for possible breakthrough, but seems to be maintaining sinus rhythm.  On Eliquis without any bleeding. -Any bleeding, will stop Plavix

## 2018-01-13 ENCOUNTER — Encounter: Payer: Self-pay | Admitting: Physician Assistant

## 2018-01-14 ENCOUNTER — Other Ambulatory Visit: Payer: Self-pay | Admitting: *Deleted

## 2018-01-14 DIAGNOSIS — I6523 Occlusion and stenosis of bilateral carotid arteries: Secondary | ICD-10-CM

## 2018-01-14 NOTE — Progress Notes (Unsigned)
Medications mistakenly vended.  Meds were actually for Mark Jackson [id number]  [id number].

## 2018-01-24 DIAGNOSIS — C61 Malignant neoplasm of prostate: Secondary | ICD-10-CM | POA: Diagnosis not present

## 2018-01-24 DIAGNOSIS — Z8545 Personal history of malignant neoplasm of unspecified male genital organ: Secondary | ICD-10-CM | POA: Diagnosis not present

## 2018-01-24 DIAGNOSIS — R351 Nocturia: Secondary | ICD-10-CM | POA: Diagnosis not present

## 2018-01-24 DIAGNOSIS — Z8546 Personal history of malignant neoplasm of prostate: Secondary | ICD-10-CM | POA: Diagnosis not present

## 2018-01-25 ENCOUNTER — Ambulatory Visit (INDEPENDENT_AMBULATORY_CARE_PROVIDER_SITE_OTHER): Payer: Medicare Other | Admitting: Family Medicine

## 2018-01-25 ENCOUNTER — Encounter: Payer: Self-pay | Admitting: Family Medicine

## 2018-01-25 VITALS — BP 128/74 | HR 64 | Temp 98.0°F | Ht 70.0 in | Wt 186.8 lb

## 2018-01-25 DIAGNOSIS — N183 Chronic kidney disease, stage 3 unspecified: Secondary | ICD-10-CM

## 2018-01-25 DIAGNOSIS — I10 Essential (primary) hypertension: Secondary | ICD-10-CM | POA: Diagnosis not present

## 2018-01-25 DIAGNOSIS — E538 Deficiency of other specified B group vitamins: Secondary | ICD-10-CM | POA: Diagnosis not present

## 2018-01-25 DIAGNOSIS — I6523 Occlusion and stenosis of bilateral carotid arteries: Secondary | ICD-10-CM

## 2018-01-25 DIAGNOSIS — R739 Hyperglycemia, unspecified: Secondary | ICD-10-CM | POA: Diagnosis not present

## 2018-01-25 DIAGNOSIS — G43009 Migraine without aura, not intractable, without status migrainosus: Secondary | ICD-10-CM | POA: Diagnosis not present

## 2018-01-25 DIAGNOSIS — D649 Anemia, unspecified: Secondary | ICD-10-CM

## 2018-01-25 LAB — CBC
HEMATOCRIT: 34.7 % — AB (ref 39.0–52.0)
HEMOGLOBIN: 11.7 g/dL — AB (ref 13.0–17.0)
MCHC: 33.6 g/dL (ref 30.0–36.0)
MCV: 99.1 fl (ref 78.0–100.0)
Platelets: 217 10*3/uL (ref 150.0–400.0)
RBC: 3.5 Mil/uL — ABNORMAL LOW (ref 4.22–5.81)
RDW: 13.4 % (ref 11.5–15.5)
WBC: 8.9 10*3/uL (ref 4.0–10.5)

## 2018-01-25 LAB — COMPREHENSIVE METABOLIC PANEL
ALT: 11 U/L (ref 0–53)
AST: 16 U/L (ref 0–37)
Albumin: 3.7 g/dL (ref 3.5–5.2)
Alkaline Phosphatase: 59 U/L (ref 39–117)
BUN: 39 mg/dL — ABNORMAL HIGH (ref 6–23)
CHLORIDE: 111 meq/L (ref 96–112)
CO2: 24 meq/L (ref 19–32)
Calcium: 8.8 mg/dL (ref 8.4–10.5)
Creatinine, Ser: 2.04 mg/dL — ABNORMAL HIGH (ref 0.40–1.50)
GFR: 33.12 mL/min — AB (ref >60.00)
GLUCOSE: 97 mg/dL (ref 70–99)
POTASSIUM: 4.7 meq/L (ref 3.5–5.1)
SODIUM: 142 meq/L (ref 135–145)
Total Bilirubin: 0.3 mg/dL (ref 0.2–1.2)
Total Protein: 6.2 g/dL (ref 6.0–8.3)

## 2018-01-25 LAB — VITAMIN B12: Vitamin B-12: 587 pg/mL (ref 211–911)

## 2018-01-25 LAB — HEMOGLOBIN A1C: Hgb A1c MFr Bld: 6.1 % (ref 4.6–6.5)

## 2018-01-25 MED ORDER — TOPIRAMATE 200 MG PO TABS: 200.0000 mg | ORAL_TABLET | Freq: Every day | ORAL | 3 refills | Status: DC

## 2018-01-25 NOTE — Assessment & Plan Note (Signed)
Reports recent visit with Dr. Moshe Cipro. I did ask him to discuss topamax dosing- may need to titrate down to 100mg  if she recommends- he has been stable on 200mg  dose though so for now not makign adjustments

## 2018-01-25 NOTE — Assessment & Plan Note (Signed)
S: was on b12 every 5 weeks in past- he wants to go back to this instead of q30 days A/P: will update b12 today- got o b12 every 5 weeks if level over 1500

## 2018-01-25 NOTE — Assessment & Plan Note (Signed)
S: Patient with history of migraines. Had been seeing neurologist in [place]. He has been on topamax 200mg  and had pretty good control. Prior He uses tylenol and sudafed at time of migraine at least once a month for breakthrough headaches. Sudafed not advised with HTN history- he states he stopped this and he hasnt had worsening symptoms. Now he is on tylenol very sparingly- less than once a month A/P: We will take over this rx for him. Can refer him back if worsening control.

## 2018-01-25 NOTE — Progress Notes (Signed)
Subjective:  Mark Jackson. is a 84 y.o. year old very pleasant male patient who presents for/with See problem oriented charting ROS- No chest pain. Stable shortness of breath. No headache or blurry vision.    Past Medical History-  Patient Active Problem List   Diagnosis Date Noted  . Crescendo angina ([place]) 06/01/2016    Priority: High  . CAD S/P DES PCI to proximal LAD 03/22/2015    Priority: High  . Chronic diastolic heart failure ([place]) 08/23/2014    Priority: High  . Paroxysmal atrial fibrillation ([place]); CHA2DSVasc - 4; Now on Eliquis 08/20/2014    Priority: High    Class: Diagnosis of  . CKD (chronic kidney disease) stage 3, GFR 30-59 ml/min (HCC) 08/20/2014    Priority: High  . Personal history of prostate cancer 10/22/2008    Priority: High  . Hyperglycemia 09/27/2017    Priority: Medium  . B12 deficiency 01/06/2017    Priority: Medium  . BPH associated with nocturia 06/15/2016    Priority: Medium  . Aortic stenosis 03/14/2015    Priority: Medium  . Hereditary and idiopathic peripheral neuropathy 01/12/2014    Priority: Medium  . H/O syncope 09/03/2013    Priority: Medium  . Right-sided carotid artery disease; followed by Dr. Ellyn Hack 03/02/2013    Priority: Medium  . Hyperlipidemia with target LDL less than 70 03/02/2013    Priority: Medium  . Migraine without aura 10/26/2012    Priority: Medium  . Anemia 10/23/2008    Priority: Medium  . Essential hypertension 10/22/2008    Priority: Medium  . Sleep apnea 10/22/2008    Priority: Medium  . History of colonic polyps 10/22/2008    Priority: Medium  . Chronic diarrhea 01/06/2017    Priority: Low  . Perianal dermatitis 06/19/2015    Priority: Low  . Rectal bleeding 04/25/2015    Priority: Low  . Abnormal finding on EKG 08/31/2014    Priority: Low  . Dyspnea 08/31/2014    Priority: Low  . Long term current use of anticoagulant therapy 08/27/2014    Priority: Low  . Obesity (BMI 30-39.9) 09/03/2013     Priority: Low  . GLAUCOMA 10/23/2008    Priority: Low  . Hemorrhoids 10/22/2008    Priority: Low  . Arthropathy 10/22/2008    Priority: Low  . Fatigue 11/08/2017  . Bilateral lower extremity edema 04/15/2017    Medications- reviewed and updated Current Outpatient Medications  Medication Sig Dispense Refill  . Ca Carbonate-Mag Hydroxide (ROLAIDS PO) Take 1 tablet by mouth daily as needed (acid reflux).    . clopidogrel (PLAVIX) 75 MG tablet TAKE 1 TABLET EVERY DAY WITH BREAKFAST. 60 tablet 11  . cycloSPORINE (RESTASIS) 0.05 % ophthalmic emulsion Place 1 drop into both eyes daily as needed.     . diltiazem (CARDIZEM CD) 180 MG 24 hr capsule TAKE (1) CAPSULE DAILY. 90 capsule 1  . ELIQUIS 2.5 MG TABS tablet TAKE 1 TABLET BY MOUTH TWICE DAILY. 60 tablet 1  . ferrous sulfate 325 (65 FE) MG tablet Take 325 mg by mouth daily with breakfast.    . furosemide (LASIX) 20 MG tablet TAKE ONE TABLET DAILY , MAY TAKE AN ADDITIONAL EXTRA TABLET DAILY IF NEEDED FOR SWELLING 45 tablet 11  . hydrocortisone (ANUSOL-HC) 2.5 % rectal cream Place 1 application rectally 2 (two) times daily. Please give pt applicators. Thank you. 30 g 1  . loperamide (IMODIUM A-D) 2 MG tablet Take 2 mg by mouth 4 (four) times  daily as needed for diarrhea or loose stools.    Marland Kitchen NITROSTAT 0.4 MG SL tablet ONE TABLET UNDER TONGUE WHEN NEEDED FOR CHEST PAIN. MAY REPEAT IN 5 MINUTES. 25 tablet 4  . rosuvastatin (CRESTOR) 10 MG tablet Take 10 mg by mouth daily.    Marland Kitchen topiramate (TOPAMAX) 200 MG tablet Take 1 tablet (200 mg total) by mouth daily. 90 tablet 3   No current facility-administered medications for this visit.     Objective: BP 128/74 (BP Location: Left Arm, Patient Position: Sitting, Cuff Size: Normal)   Pulse 64   Temp 98 F (36.7 C) (Oral)   Ht 5\' 10"  (1.778 m)   Wt 186 lb 12.8 oz (84.7 kg)   SpO2 98%   BMI 26.80 kg/m  Gen: NAD, resting comfortably CV: RRR slight murmur RUSB most prominent Lungs: CTAB no  crackles, wheeze, rhonchi Abdomen: soft/nontender/nondistended/normal bowel sounds.  Ext: 1+ edema under compression stockings Skin: warm, dry Neuro: normal speech, moves all extremities   Assessment/Plan:  Other notes: 1. sleeping in recliner- feels better- hurts less. Not doing for SOB.   Migraine without aura S: Patient with history of migraines. Had been seeing neurologist in [place]. He has been on topamax 200mg  and had pretty good control. Prior He uses tylenol and sudafed at time of migraine at least once a month for breakthrough headaches. Sudafed not advised with HTN history- he states he stopped this and he hasnt had worsening symptoms. Now he is on tylenol very sparingly- less than once a month A/P: We will take over this rx for him. Can refer him back if worsening control.   Essential hypertension S: controlled on m 180mg  XR, lasix 20mg . Monitors at home-- getting home #s ranging from 120s to 140s for most part- in offices including nephrology and hear usually under 140/90 BP Readings from Last 3 Encounters:  01/25/18 128/74  01/10/18 (!) 146/76  11/08/17 (!) 147/71  A/P: We discussed blood pressure goal of <140/90. Continue current meds:  Syncope in past- some permissive HTN on home #s    Hyperglycemia S: weight down 1 lb. Metformin not great idea with CKD Lab Results  Component Value Date   HGBA1C 6.2 10/11/2017   HGBA1C 6.0 05/25/2017   HGBA1C 6.0 (H) 01/12/2014  A/P: update a1c- continue to work on healthy eating   B12 deficiency S: was on b12 every 5 weeks in past- he wants to go back to this instead of q30 days A/P: will update b12 today- got o b12 every 5 weeks if level over 1500   CKD (chronic kidney disease) stage 3, GFR 30-59 ml/min ([place]) Reports recent visit with Dr. Moshe Cipro. I did ask him to discuss topamax dosing- may need to titrate down to 100mg  if she recommends- he has been stable on 200mg  dose though so for now not makign  adjustments  Anemia Minimal rectal bleeding from hemorrhoids. 09/18/15- no further colonoscopy unless worsening anemia. Saw Dr. Carlean Purl at that time. Had polyps but due to age no more colonoscopy.  Update cbc for stability. CKD likely contributes   Future Appointments  Date Time Provider [place]  02/02/2018  9:00 AM [facility] NURSE [facility]  03/24/2018 10:00 AM Williemae Area, RN [facility]   No follow-ups on file.  Lab/Order associations: Migraine without aura and without status migrainosus, not intractable - Plan: topiramate (TOPAMAX) 200 MG tablet  Essential hypertension - Plan: CBC, Comprehensive metabolic panel  Hyperglycemia - Plan: Hemoglobin A1c  B12 deficiency -  Plan: Vitamin B12  CKD (chronic kidney disease) stage 3, GFR 30-59 ml/min (HCC) - Plan: CBC, Comprehensive metabolic panel  Anemia, unspecified type - Plan: CBC  Meds ordered this encounter  Medications  . topiramate (TOPAMAX) 200 MG tablet    Sig: Take 1 tablet (200 mg total) by mouth daily.    Dispense:  90 tablet    Refill:  3    Return precautions advised.  [name], MD

## 2018-01-25 NOTE — Patient Instructions (Addendum)
Please run the topamax dose by Dr. Demetria Pore- from my view typically I would not go over 100 mg but since you have done well without adverse side effects I will not change for now  Continue to work on healthy eating and exercise as able to prevent progression to diabetes- updating a1c today  If b12 is high- can go back to every 5 week checks  4-6 month follow up  Team- what is status of AWV? Can we get him set up for one please?

## 2018-01-25 NOTE — Assessment & Plan Note (Signed)
S: weight down 1 lb. Metformin not great idea with CKD Lab Results  Component Value Date   HGBA1C 6.2 10/11/2017   HGBA1C 6.0 05/25/2017   HGBA1C 6.0 (H) 01/12/2014  A/P: update a1c- continue to work on healthy eating

## 2018-01-25 NOTE — Assessment & Plan Note (Addendum)
Minimal rectal bleeding from hemorrhoids. 09/18/15- no further colonoscopy unless worsening anemia. Saw Dr. Carlean Purl at that time. Had polyps but due to age no more colonoscopy.  Update cbc for stability. CKD likely contributes

## 2018-01-25 NOTE — Progress Notes (Signed)
Your CBC was stable (blood counts, infection fighting cells, platelets). Anemia noted but not worsening.  Your CMET was stable (kidney, liver, and electrolytes, blood sugar) still with chronic kidney disease stage III noted but not worsening.  Your a1c is stable at 6.1 which means you remain out of diabetes range.  Your B12 has dropped to 587- you can continue monthly injections. Its ok if this stretches at times to 5 weeks.

## 2018-01-25 NOTE — Assessment & Plan Note (Signed)
S: controlled on m 180mg  XR, lasix 20mg . Monitors at home-- getting home #s ranging from 120s to 140s for most part- in offices including nephrology and hear usually under 140/90 BP Readings from Last 3 Encounters:  01/25/18 128/74  01/10/18 (!) 146/76  11/08/17 (!) 147/71  A/P: We discussed blood pressure goal of <140/90. Continue current meds:  Syncope in past- some permissive HTN on home #s

## 2018-01-27 ENCOUNTER — Encounter: Payer: Self-pay | Admitting: Family Medicine

## 2018-02-02 ENCOUNTER — Ambulatory Visit (INDEPENDENT_AMBULATORY_CARE_PROVIDER_SITE_OTHER): Payer: Medicare Other

## 2018-02-02 DIAGNOSIS — E538 Deficiency of other specified B group vitamins: Secondary | ICD-10-CM | POA: Diagnosis not present

## 2018-02-02 MED ORDER — CYANOCOBALAMIN 1000 MCG/ML IJ SOLN
1000.0000 ug | Freq: Once | INTRAMUSCULAR | Status: AC
  Administered 2018-02-02: 1000 ug via INTRAMUSCULAR

## 2018-02-02 NOTE — Progress Notes (Signed)
I have reviewed and agree with note, evaluation, plan.   Lillyrose Reitan, MD  

## 2018-02-02 NOTE — Progress Notes (Signed)
Patient in today for B12 injection due to B12 deficiency. Administered in the left arm. Patient tolerated well.

## 2018-02-25 ENCOUNTER — Other Ambulatory Visit: Payer: Self-pay | Admitting: Cardiology

## 2018-02-25 DIAGNOSIS — Z9861 Coronary angioplasty status: Principal | ICD-10-CM

## 2018-02-25 DIAGNOSIS — Z7901 Long term (current) use of anticoagulants: Secondary | ICD-10-CM

## 2018-02-25 DIAGNOSIS — I2510 Atherosclerotic heart disease of native coronary artery without angina pectoris: Secondary | ICD-10-CM

## 2018-02-25 NOTE — Telephone Encounter (Signed)
eliquis  

## 2018-03-03 ENCOUNTER — Ambulatory Visit (INDEPENDENT_AMBULATORY_CARE_PROVIDER_SITE_OTHER): Payer: Medicare Other

## 2018-03-03 DIAGNOSIS — E538 Deficiency of other specified B group vitamins: Secondary | ICD-10-CM

## 2018-03-03 MED ORDER — CYANOCOBALAMIN 1000 MCG/ML IJ SOLN
1000.0000 ug | Freq: Once | INTRAMUSCULAR | Status: AC
  Administered 2018-03-03: 1000 ug via INTRAMUSCULAR

## 2018-03-03 NOTE — Progress Notes (Signed)
Per orders of Dr. [name] , injection of B12 given by Francella Solian. Patient tolerated injection well. Injection given in right deltoid. Patient will make another appointment for one month.

## 2018-03-18 ENCOUNTER — Other Ambulatory Visit: Payer: Self-pay
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
## 2018-03-24 ENCOUNTER — Ambulatory Visit (INDEPENDENT_AMBULATORY_CARE_PROVIDER_SITE_OTHER): Payer: Medicare Other

## 2018-03-24 VITALS — BP 126/60 | HR 69 | Ht 68.0 in | Wt 186.0 lb

## 2018-03-24 DIAGNOSIS — Z0000 Encounter for general adult medical examination without abnormal findings: Secondary | ICD-10-CM | POA: Diagnosis not present

## 2018-03-24 NOTE — Patient Instructions (Addendum)
Mark Jackson , Thank you for taking time to come for your Medicare Wellness Visit. I appreciate your ongoing commitment to your health goals. Please review the following plan we discussed and let me know if I can assist you in the future.   Mark Jackson will send you an Alzheimer's packet They have a 24/7 helpline  Tetanus is due A Tetanus is recommended every 10 years. Medicare covers a tetanus if you have a cut or wound; otherwise, there may be a charge. If you had not had a tetanus with pertusses, known as the Tdap, you can take this anytime.   Alzheimer's Association / Family information and training  https://www.clark-whitaker.org/.asp Loss adjuster, chartered for information  Driving resource center; Can send out driving contract  Get help & support Media planner  Support groups  Education programs  SAFETY; TeleconferenceOnDemand.fr.asp#howdementiaaffects  [name]  (please mail donations to this address) [address]  [place], [place] [address] P: [phone] F: [address] [place], [place] [address] P: [phone] F: [address], [address] [place], [place] [address] P: [phone] F: [phone]  Families to call the 800 number to get information on all the resources in the area 24 hour 1 [phone]  Can provide resources; Questions about Dementia; Support groups; Give the caregiver information regarding respite ([facility]) Call the [place] [employer];as they oversee who gets the grant fund for certain areas ([phone]) Also have Tools to help navigate the needs of the patient  Opportunities for free educational programs online 100 support groups ;  Just starting Early stage program and care partners for the patient and the family 12 weeks of a support group;  12 weeks of social engagement  component End with Educational wrap up Cal the 800 number given for more information  All's connected  Navigator for those more comfortable with navigating the internet and allows one to develop a plan based on resources;  Website and review the educational calander for programs that are available.  They do care consultations with appointments  Packets for physician offices/  Packets physicians can give to newly dx patients Early stage flyers  Also have examples of Safety services and jewelry  You can look at [facility] online    Will take your flu vaccine this year   These are the goals we discussed: Goals    . patient     Maintain health Walks x 3 to 4 times a week       This is a list of the screening recommended for you and due dates:  Health Maintenance  Topic Date Due  . Tetanus Vaccine  08/06/1949  . Flu Shot  03/10/2018  . Pneumonia vaccines  Completed   Prevention of falls: Remove rugs or any tripping hazards in the home Use Non slip mats in bathtubs and showers Placing grab bars next to the toilet and or shower Placing handrails on both sides of the stair way Adding extra lighting in the home.   Personal safety issues reviewed:  1. Consider starting a community watch program per [facility] 2.  Changes batteries is smoke detector and/or carbon monoxide detector  3.  If you have firearms; keep them in a safe place 4.  Wear protection when in the sun; Always wear sunscreen or a hat; It is good to have your doctor check your skin annually or review any new areas of concern 5. Driving safety; Keep  in the right lane; stay 3 car lengths behind the car in front of you on the highway; look 3 times prior to pulling out; carry your cell phone everywhere you go!    Learn about the Yellow Dot program:  The program allows first responders at your emergency to have access to who your physician is, as well as your medications and medical conditions.   Citizens requesting the Yellow Dot Packages should contact Master Corporal Nunzio Cobbs at the [facility] [phone] for the first week of the program and beginning the week after Easter citizens should contact their Scientist, physiological.

## 2018-03-24 NOTE — Progress Notes (Signed)
I have reviewed and agree with note, evaluation, plan. I agree- caregiver burden has worn on him- glad he is looking at optiosn to get more help.   [name], MD

## 2018-04-05 ENCOUNTER — Ambulatory Visit (INDEPENDENT_AMBULATORY_CARE_PROVIDER_SITE_OTHER): Payer: Medicare Other

## 2018-04-05 DIAGNOSIS — E538 Deficiency of other specified B group vitamins: Secondary | ICD-10-CM | POA: Diagnosis not present

## 2018-04-05 MED ORDER — CYANOCOBALAMIN 1000 MCG/ML IJ SOLN
1000.0000 ug | Freq: Once | INTRAMUSCULAR | Status: AC
  Administered 2018-04-05: 1000 ug via INTRAMUSCULAR

## 2018-04-05 NOTE — Progress Notes (Signed)
Per orders of Dr. [name], injection of b-12 given by Francella Solian. Patient tolerated injection well. Injection given in right deltoid with no problems. He will make appointment for next injection for one month on way out.

## 2018-05-01 ENCOUNTER — Other Ambulatory Visit: Payer: Self-pay | Admitting: Physician Assistant

## 2018-05-03 ENCOUNTER — Ambulatory Visit (INDEPENDENT_AMBULATORY_CARE_PROVIDER_SITE_OTHER): Payer: Medicare Other

## 2018-05-03 DIAGNOSIS — E538 Deficiency of other specified B group vitamins: Secondary | ICD-10-CM

## 2018-05-03 MED ORDER — CYANOCOBALAMIN 1000 MCG/ML IJ SOLN
1000.0000 ug | Freq: Once | INTRAMUSCULAR | Status: AC
Start: 2018-05-03 — End: 2018-05-03
  Administered 2018-05-03: 1000 ug via INTRAMUSCULAR

## 2018-05-03 NOTE — Progress Notes (Signed)
Patient in today for B12 injection due to B12 deficiency. Administered in right arm. Patient tolerated well.

## 2018-05-03 NOTE — Patient Instructions (Signed)
Health Maintenance Due  Topic Date Due  . TETANUS/TDAP  08/06/1949  . INFLUENZA VACCINE  03/10/2018    Depression screen [facility] 2/9 03/24/2018 05/25/2017 02/13/2016  Decreased Interest 0 0 0  Down, Depressed, Hopeless 0 0 0  PHQ - 2 Score 0 0 0  Some recent data might be hidden

## 2018-05-10 ENCOUNTER — Ambulatory Visit (INDEPENDENT_AMBULATORY_CARE_PROVIDER_SITE_OTHER): Payer: Medicare Other | Admitting: Family Medicine

## 2018-05-10 ENCOUNTER — Encounter: Payer: Self-pay | Admitting: Family Medicine

## 2018-05-10 VITALS — BP 150/70 | HR 65 | Temp 98.2°F

## 2018-05-10 DIAGNOSIS — J329 Chronic sinusitis, unspecified: Secondary | ICD-10-CM | POA: Diagnosis not present

## 2018-05-10 DIAGNOSIS — R079 Chest pain, unspecified: Secondary | ICD-10-CM

## 2018-05-10 DIAGNOSIS — I2510 Atherosclerotic heart disease of native coronary artery without angina pectoris: Secondary | ICD-10-CM | POA: Diagnosis not present

## 2018-05-10 DIAGNOSIS — I6523 Occlusion and stenosis of bilateral carotid arteries: Secondary | ICD-10-CM

## 2018-05-10 DIAGNOSIS — B9689 Other specified bacterial agents as the cause of diseases classified elsewhere: Secondary | ICD-10-CM

## 2018-05-10 DIAGNOSIS — Z9861 Coronary angioplasty status: Secondary | ICD-10-CM

## 2018-05-10 HISTORY — PX: NM MYOVIEW LTD: HXRAD82

## 2018-05-10 MED ORDER — AMOXICILLIN-POT CLAVULANATE 875-125 MG PO TABS: 1.0000 | ORAL_TABLET | Freq: Two times a day (BID) | ORAL | 0 refills | Status: AC

## 2018-05-10 NOTE — Patient Instructions (Addendum)
Augmentin for sinusitis. See me back if not improving  EKG was stable but that does not rule out your heart being involved. I recommended you be seen in emergency room tonight- you declined/erefused against medical advice. You are perfectly aware this could be related to your heart and you could have a heart attack. You agreed to go to hospital/call 911 with worsening symptoms and to call/see Dr. Allison Quarry office ASAP tomorrow.

## 2018-05-10 NOTE — Progress Notes (Signed)
Subjective:  Mark Jackson. is a 84 y.o. year old very pleasant male patient who presents for/with See problem oriented charting ROS- no fever, chills. Does have cough, sinus pressure, chest congestion. Does have chest pain.    Past Medical History-  Patient Active Problem List   Diagnosis Date Noted  . Crescendo angina ([place]) 06/01/2016    Priority: High  . CAD S/P DES PCI to proximal LAD 03/22/2015    Priority: High  . Chronic diastolic heart failure ([place]) 08/23/2014    Priority: High  . Paroxysmal atrial fibrillation ([place]); CHA2DSVasc - 4; Now on Eliquis 08/20/2014    Priority: High    Class: Diagnosis of  . CKD (chronic kidney disease) stage 3, GFR 30-59 ml/min (HCC) 08/20/2014    Priority: High  . Personal history of prostate cancer 10/22/2008    Priority: High  . Hyperglycemia 09/27/2017    Priority: Medium  . B12 deficiency 01/06/2017    Priority: Medium  . BPH associated with nocturia 06/15/2016    Priority: Medium  . Aortic stenosis 03/14/2015    Priority: Medium  . Hereditary and idiopathic peripheral neuropathy 01/12/2014    Priority: Medium  . H/O syncope 09/03/2013    Priority: Medium  . Right-sided carotid artery disease; followed by Dr. Ellyn Hack 03/02/2013    Priority: Medium  . Hyperlipidemia with target LDL less than 70 03/02/2013    Priority: Medium  . Migraine without aura 10/26/2012    Priority: Medium  . Anemia 10/23/2008    Priority: Medium  . Essential hypertension 10/22/2008    Priority: Medium  . Sleep apnea 10/22/2008    Priority: Medium  . History of colonic polyps 10/22/2008    Priority: Medium  . Chronic diarrhea 01/06/2017    Priority: Low  . Perianal dermatitis 06/19/2015    Priority: Low  . Rectal bleeding 04/25/2015    Priority: Low  . Abnormal finding on EKG 08/31/2014    Priority: Low  . Dyspnea 08/31/2014    Priority: Low  . Long term current use of anticoagulant therapy 08/27/2014    Priority: Low  . Obesity (BMI  30-39.9) 09/03/2013    Priority: Low  . GLAUCOMA 10/23/2008    Priority: Low  . Hemorrhoids 10/22/2008    Priority: Low  . Arthropathy 10/22/2008    Priority: Low  . Fatigue 11/08/2017  . Bilateral lower extremity edema 04/15/2017    Medications- reviewed and updated Current Outpatient Medications  Medication Sig Dispense Refill  . amoxicillin-clavulanate (AUGMENTIN) 875-125 MG tablet Take 1 tablet by mouth 2 (two) times daily for 7 days. 14 tablet 0  . Ca Carbonate-Mag Hydroxide (ROLAIDS PO) Take 1 tablet by mouth daily as needed (acid reflux).    . clopidogrel (PLAVIX) 75 MG tablet TAKE 1 TABLET EVERY DAY WITH BREAKFAST. 60 tablet 11  . cycloSPORINE (RESTASIS) 0.05 % ophthalmic emulsion Place 1 drop into both eyes daily as needed.     . diltiazem (CARDIZEM CD) 180 MG 24 hr capsule TAKE (1) CAPSULE DAILY. 90 capsule 1  . ELIQUIS 2.5 MG TABS tablet TAKE 1 TABLET BY MOUTH TWICE DAILY. 60 tablet 5  . ferrous sulfate 325 (65 FE) MG tablet Take 325 mg by mouth daily with breakfast.    . furosemide (LASIX) 20 MG tablet TAKE ONE TABLET DAILY , MAY TAKE AN ADDITIONAL EXTRA TABLET DAILY IF NEEDED FOR SWELLING 45 tablet 11  . hydrocortisone (ANUSOL-HC) 2.5 % rectal cream Place 1 application rectally 2 (two) times daily. Please  give pt applicators. Thank you. 30 g 1  . loperamide (IMODIUM A-D) 2 MG tablet Take 2 mg by mouth 4 (four) times daily as needed for diarrhea or loose stools.    Marland Kitchen NITROSTAT 0.4 MG SL tablet ONE TABLET UNDER TONGUE WHEN NEEDED FOR CHEST PAIN. MAY REPEAT IN 5 MINUTES. 25 tablet 4  . rosuvastatin (CRESTOR) 10 MG tablet Take 10 mg by mouth daily.    Marland Kitchen topiramate (TOPAMAX) 200 MG tablet Take 1 tablet (200 mg total) by mouth daily. 90 tablet 3   No current facility-administered medications for this visit.     Objective: BP (!) 150/70 (BP Location: Left Arm, Patient Position: Sitting, Cuff Size: Large)   Pulse 65   Temp 98.2 F (36.8 C) (Oral)   SpO2 98%  Gen: appears  fatigued, intermittent deep cough Yellow congestion in nares, sinus pressure noted. Pharynx with mild drainage CV: RRR stable murmur No chest wall pain Lungs: CTAB no crackles, wheeze, rhonchi Abdomen: soft/nontender/nondistended/normal bowel sounds.  Ext: no edema Skin: warm, dry  EKG: sinus bradycardia with rate 59, normal axis, slightly prolonged PR but otherwise normal intervals, no hypertrophy, no significant st or t wave changes from 04/13/17 EKG- stable flattening in v4 and v5  Assessment/Plan:  Chest pain, cough, congestion S: Patient on day 9 of illness which includes sinus congestion with cloudy green/yellow discharge as well as runny nose at times. Also with chest congestion. States with similar symptoms cleared up with cough syrup in past through Dr. Everlene Farrier though not sure he had as much sinus congestion and tenderness at that time. Sudafed has helped him in the past and he has taken 2 with this (strongly advised against given CAD as well as hypertension). He took one today and one  On Saturday.   He states his bigger concern has been that over past up to 6 days he has been dealing with sharp left sided chest pain. Patient states he can just be sitting there and gets 30-40 seconds of electrical like impulses in chest for 20-30 seconds. Just since getting to the office today has noted at least 20 times. May be gone for a minute or less or up to an hour but pretty consistent throughout day. States not worse with stress or with walking. Denies palpitations, shortness of breath, left arm or neck pain. Denies blood in stool.   He feels this is just due to him feeling fun down from illness. Denies blood in stool or dark stools. He feels very fatigued A/P: I do suspect patient has a bacterial sinusitis and we will treat him with augmentin for 7 days (GFR calculated at GFR, crcl may be on borderline for using lower dose of augmentin). It is day 9 and he has not improved at all so believe  reasonable to go ahead and treat him. He is also high risk due to his age and multiple medical comorbidities  I was clear with him that my greater concern was also his- the chest pain. EKG largely reassuring today. Patient was chest pain free by time of end of visit (and had been for 20 minutes) but has been having recurrent issues. I told him I thought he needed at least one troponin and unfortunately for [place] to run this it would not be back until tomorrow morning likely as would have late courier and would have to be frozen sample. My advice was to go to ER- he declines and states must care for wife. Fortunately daughter will  stay with him tonight and check in on him and take to ER or call 911 with worsening symptoms. He also agrees to take nitroglycerin tonight to see if it helps and call Dr. Ellyn Hack in the AM to get an appointment with [facility] cardiology. Patient knows going home is against my medical advice- and of risk of heart attack or death but yet wants to proceed with this plan.   Considered x-ray to evaluate for pneumonia but unfortunately caring for patient after traditional hours and x-ray tech was no longer available  With fatigue- was able to get CBC, CMET ordered before our lab tech had left- and before I went to evaluate next patient before returning to care for patient- at which point it was too late to get x-ray as above.   Future Appointments  Date Time Provider [place]  05/26/2018  8:45 AM [name], MD [facility]  05/31/2018 10:30 AM [facility] NURSE [facility]  patient was considering cancelling visit on 17th- I did not endorse this plan- will elave on books for now.   Lab/Order associations: Chest pain, unspecified type - Plan: CBC, Comprehensive metabolic panel, EKG [id number]  CAD S/P DES PCI to proximal LAD  Bacterial sinusitis  Meds ordered this encounter  Medications  . amoxicillin-clavulanate (AUGMENTIN) 875-125 MG tablet    Sig: Take 1 tablet by  mouth 2 (two) times daily for 7 days.    Dispense:  14 tablet    Refill:  0   Time Stamp The duration of face-to-face time during this visit was greater than 40 minutes(4:57- 5 PM-initial discussion and lab ordering; 5:22 PM- 6 PM- printed avs at 5 57 but reviewed this after that through 6 PM). Greater than 50% of this time was spent in counseling, explanation of diagnosis, planning of further management, and/or coordination of care including discussion of fact he is high risk patient, encouraging him to seek care sooner in future with chest pain, discussing risks of going home against advice, discussing even more emergent return precautions, going over AVS together, discussing importance of cardiology visit tomorrow with declined evaluation emergently tonight  Return precautions advised.  [name], MD

## 2018-05-11 ENCOUNTER — Encounter: Payer: Self-pay | Admitting: Cardiology

## 2018-05-11 ENCOUNTER — Telehealth: Payer: Self-pay | Admitting: Cardiology

## 2018-05-11 ENCOUNTER — Ambulatory Visit (INDEPENDENT_AMBULATORY_CARE_PROVIDER_SITE_OTHER): Payer: Medicare Other | Admitting: Cardiology

## 2018-05-11 VITALS — BP 120/80 | HR 70 | Ht 68.0 in | Wt 184.6 lb

## 2018-05-11 DIAGNOSIS — R600 Localized edema: Secondary | ICD-10-CM

## 2018-05-11 DIAGNOSIS — I739 Peripheral vascular disease, unspecified: Secondary | ICD-10-CM | POA: Diagnosis not present

## 2018-05-11 DIAGNOSIS — I350 Nonrheumatic aortic (valve) stenosis: Secondary | ICD-10-CM

## 2018-05-11 DIAGNOSIS — I480 Paroxysmal atrial fibrillation: Secondary | ICD-10-CM

## 2018-05-11 DIAGNOSIS — R5382 Chronic fatigue, unspecified: Secondary | ICD-10-CM | POA: Diagnosis not present

## 2018-05-11 DIAGNOSIS — E785 Hyperlipidemia, unspecified: Secondary | ICD-10-CM

## 2018-05-11 DIAGNOSIS — I10 Essential (primary) hypertension: Secondary | ICD-10-CM | POA: Diagnosis not present

## 2018-05-11 DIAGNOSIS — I2510 Atherosclerotic heart disease of native coronary artery without angina pectoris: Secondary | ICD-10-CM

## 2018-05-11 DIAGNOSIS — Z9861 Coronary angioplasty status: Secondary | ICD-10-CM | POA: Diagnosis not present

## 2018-05-11 DIAGNOSIS — I5032 Chronic diastolic (congestive) heart failure: Secondary | ICD-10-CM | POA: Diagnosis not present

## 2018-05-11 DIAGNOSIS — R079 Chest pain, unspecified: Secondary | ICD-10-CM

## 2018-05-11 DIAGNOSIS — I6523 Occlusion and stenosis of bilateral carotid arteries: Secondary | ICD-10-CM

## 2018-05-11 DIAGNOSIS — I779 Disorder of arteries and arterioles, unspecified: Secondary | ICD-10-CM

## 2018-05-11 LAB — CBC
HCT: 32.8 % — ABNORMAL LOW (ref 39.0–52.0)
Hemoglobin: 11.2 g/dL — ABNORMAL LOW (ref 13.0–17.0)
MCHC: 34.3 g/dL (ref 30.0–36.0)
MCV: 96.8 fl (ref 78.0–100.0)
Platelets: 236 10*3/uL (ref 150.0–400.0)
RBC: 3.39 Mil/uL — ABNORMAL LOW (ref 4.22–5.81)
RDW: 12.8 % (ref 11.5–15.5)
WBC: 9.4 10*3/uL (ref 4.0–10.5)

## 2018-05-11 LAB — COMPREHENSIVE METABOLIC PANEL
ALT: 11 U/L (ref 0–53)
AST: 18 U/L (ref 0–37)
Albumin: 3.5 g/dL (ref 3.5–5.2)
Alkaline Phosphatase: 55 U/L (ref 39–117)
BILIRUBIN TOTAL: 0.3 mg/dL (ref 0.2–1.2)
BUN: 44 mg/dL — ABNORMAL HIGH (ref 6–23)
CO2: 23 meq/L (ref 19–32)
Calcium: 8.4 mg/dL (ref 8.4–10.5)
Chloride: 109 mEq/L (ref 96–112)
Creatinine, Ser: 2.4 mg/dL — ABNORMAL HIGH (ref 0.40–1.50)
GFR: 27.43 mL/min — ABNORMAL LOW (ref >60.00)
GLUCOSE: 102 mg/dL — AB (ref 70–99)
Potassium: 4.3 mEq/L (ref 3.5–5.1)
Sodium: 139 mEq/L (ref 135–145)
Total Protein: 6.6 g/dL (ref 6.0–8.3)

## 2018-05-11 NOTE — Telephone Encounter (Signed)
Continue to have network difficulties unable to reach pt ./cy

## 2018-05-11 NOTE — Patient Instructions (Addendum)
MEDICATION INSTRUCTIONS  NO CHANGES  WITH MEDICATION     SCHEDULE AT [place]  [address] Your physician has requested that you have a lexiscan myoview. For further information please visit [email or web address]. Please follow instruction sheet, as given.  IF TEST IS NORMAL ,YOU CAN STOP TAKING ROSUVASTATIN FOR  2 MONTHS.   Your physician wants you to follow-up in FEB 2020 [place].You will receive a reminder letter in the mail two months in advance. If you don't receive a letter, please call our office to schedule the follow-up appointment.       If you need a refill on your cardiac medications before your next appointment, please call your pharmacy.
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
## 2018-05-11 NOTE — Telephone Encounter (Signed)
New Message:   .Marland KitchenMarland KitchenPt c/o of Chest Pain: STAT if CP now or developed within 24 hours  1. Are you having CP right now? NO  2. Are you experiencing any other symptoms (ex. SOB, nausea, vomiting, sweating)? NO   3. How long have you been experiencing CP? Several Weeks  4. Is your CP continuous or coming and going? Coming and Going   5. Have you taken Nitroglycerin? Yes last night  ?

## 2018-05-11 NOTE — Telephone Encounter (Signed)
Spoke with pt and his daughter. Pt has been having intermittent chest pain for the last couple of days. He was seen by his pcp yesterday and was adv to go to the ED for further evaluation. Pt declined. He called this morning and scheduled an appt to see [name], D-NP on 10/3.  Pt sts that he used 3 Nitro-glycerin last night. His chest pain was relieved with the 3rd Nitro. The pt did not call 911 or got to the ED.  The pt sts that he has not had any chest pain today, but his chest is a little sore. Pt and his daughter are requesting to be worked in today, adv I will discuss with [place] and call back

## 2018-05-11 NOTE — Telephone Encounter (Signed)
Unable to call at this time recording received when called pt stating there was network difficulties .Will try later .Adonis Housekeeper

## 2018-05-11 NOTE — Telephone Encounter (Signed)
Spoke with pt. Adv pt that Dr. Ellyn Hack will work him in today @ 2pm. Pt voiced appreciation for the assistance and sts that he will be here.

## 2018-05-12 ENCOUNTER — Ambulatory Visit: Payer: Medicare Other | Admitting: Adult Health

## 2018-05-12 ENCOUNTER — Ambulatory Visit (HOSPITAL_COMMUNITY)
Admission: RE | Admit: 2018-05-12 | Discharge: 2018-05-12 | Disposition: A | Discharge status: Home / Self Care | Payer: Medicare Other | Source: Ambulatory Visit | Attending: Cardiology | Admitting: Cardiology

## 2018-05-12 ENCOUNTER — Encounter: Payer: Self-pay | Admitting: Cardiology

## 2018-05-12 ENCOUNTER — Ambulatory Visit: Payer: Medicare Other | Admitting: Family Medicine

## 2018-05-12 DIAGNOSIS — Z9861 Coronary angioplasty status: Secondary | ICD-10-CM | POA: Diagnosis not present

## 2018-05-12 DIAGNOSIS — I2510 Atherosclerotic heart disease of native coronary artery without angina pectoris: Secondary | ICD-10-CM | POA: Diagnosis not present

## 2018-05-12 DIAGNOSIS — R079 Chest pain, unspecified: Secondary | ICD-10-CM

## 2018-05-12 DIAGNOSIS — I5032 Chronic diastolic (congestive) heart failure: Secondary | ICD-10-CM

## 2018-05-12 LAB — MYOCARDIAL PERFUSION IMAGING
CHL CUP NUCLEAR SDS: 0
CHL CUP NUCLEAR SRS: 0
CSEPPHR: 75 {beats}/min
LV dias vol: 117 mL (ref 62–150)
LV sys vol: 51 mL
Rest HR: 59 {beats}/min
SSS: 0
TID: 1.05

## 2018-05-12 MED ORDER — TECHNETIUM TC 99M TETROFOSMIN IV KIT
9.6000 | PACK | Freq: Once | INTRAVENOUS | Status: AC | PRN
  Administered 2018-05-12: 9.6 via INTRAVENOUS
  Filled 2018-05-12: qty 10

## 2018-05-12 MED ORDER — TECHNETIUM TC 99M TETROFOSMIN IV KIT
30.6000 | PACK | Freq: Once | INTRAVENOUS | Status: AC | PRN
  Administered 2018-05-12: 30.6 via INTRAVENOUS
  Filled 2018-05-12: qty 31

## 2018-05-12 MED ORDER — REGADENOSON 0.4 MG/5ML IV SOLN
0.4000 mg | Freq: Once | INTRAVENOUS | Status: AC
  Administered 2018-05-12: 0.4 mg via INTRAVENOUS

## 2018-05-12 NOTE — Assessment & Plan Note (Signed)
Has been doing fine with no recurrent anginal symptoms until this most recent episode of chest discomfort. Evaluating with Myoview stress test to exclude ischemia. Plan: Continue Lasix without aspirin since he is on Eliquis.  On statin and calcium channel blocker in lieu of beta-blocker.  Would be okay to stop Plavix if necessary for procedures.

## 2018-05-12 NOTE — Assessment & Plan Note (Signed)
Strange episode yesterday of feeling pulsating sensations in his chest off and on throughout the entire day.  This was concerning to his PCP.  On initial blocks, it sounded more like musculoskeletal symptoms, however at the end of the visit, with some cajoling from his daughter, it became evident that he has been taking nitroglycerin intermittently and noticed some relief with intermittent nitroglycerin last night.  To assuage her fears that he now has recurrent stenosis in his LAD stent, we will reevaluate for ischemia with a Myoview stress test.

## 2018-05-12 NOTE — Assessment & Plan Note (Signed)
Pretty well controlled.  Continue to encourage support hose.  Taking standing dose of Lasix and occasional doses as needed but not really that much now.

## 2018-05-12 NOTE — Assessment & Plan Note (Signed)
We have converted from beta-blocker to calcium channel blocker.  In the past he did have benefit from holding statin, however we will again give him a 25-month statin holiday pending the results of his stress test. If negative, he can hold for 2 months to see if is any change.  I do suspect that this has to do with poor sleep.

## 2018-05-12 NOTE — Assessment & Plan Note (Signed)
Annual follow-up Dopplers.

## 2018-05-12 NOTE — Assessment & Plan Note (Signed)
Currently taking 10 mg of Crestor.  Lipid panel is look pretty well controlled.  Okay to hold for 2 months (pending results of stress test) to see if he has any change in symptoms.

## 2018-05-12 NOTE — Assessment & Plan Note (Signed)
Plan is to recheck echocardiogram in May 2020.  Recently showed progression of the from mild to moderate.

## 2018-05-12 NOTE — Assessment & Plan Note (Addendum)
No signs or symptoms of recurrent A. fib.  Remains on diltiazem for baseline rate control. No bleeding issues with low-dose Eliquis plus Plavix

## 2018-05-12 NOTE — Assessment & Plan Note (Signed)
Blood pressure looks great on current meds.  No change

## 2018-05-24 ENCOUNTER — Other Ambulatory Visit: Payer: Self-pay | Admitting: Cardiology

## 2018-05-26 ENCOUNTER — Ambulatory Visit: Payer: Medicare Other | Admitting: Family Medicine

## 2018-05-31 ENCOUNTER — Ambulatory Visit: Payer: Medicare Other

## 2018-05-31 ENCOUNTER — Ambulatory Visit (INDEPENDENT_AMBULATORY_CARE_PROVIDER_SITE_OTHER): Payer: Medicare Other | Admitting: *Deleted

## 2018-05-31 DIAGNOSIS — E538 Deficiency of other specified B group vitamins: Secondary | ICD-10-CM | POA: Diagnosis not present

## 2018-05-31 MED ORDER — CYANOCOBALAMIN 1000 MCG/ML IJ SOLN
1000.0000 ug | Freq: Once | INTRAMUSCULAR | Status: AC
  Administered 2018-05-31: 1000 ug via INTRAMUSCULAR

## 2018-05-31 NOTE — Progress Notes (Signed)
Per orders of Dr. [name], injection of B12 given by Williemae Area. Patient tolerated injection well.

## 2018-06-06 ENCOUNTER — Other Ambulatory Visit: Payer: Self-pay | Admitting: Physician Assistant

## 2018-06-06 NOTE — Telephone Encounter (Signed)
Rx request sent to pharmacy.  

## 2018-06-28 ENCOUNTER — Other Ambulatory Visit: Payer: Self-pay | Admitting: Cardiology

## 2018-07-05 ENCOUNTER — Ambulatory Visit: Payer: Medicare Other

## 2018-07-06 ENCOUNTER — Ambulatory Visit (INDEPENDENT_AMBULATORY_CARE_PROVIDER_SITE_OTHER): Payer: Medicare Other

## 2018-07-06 DIAGNOSIS — E538 Deficiency of other specified B group vitamins: Secondary | ICD-10-CM

## 2018-07-06 MED ORDER — CYANOCOBALAMIN 1000 MCG/ML IJ SOLN
1000.0000 ug | Freq: Once | INTRAMUSCULAR | Status: AC
  Administered 2018-07-06: 1000 ug via INTRAMUSCULAR

## 2018-07-06 NOTE — Progress Notes (Signed)
Per orders of Dr. [name], injection of B-12 given by Francella Solian in to right deltoid.  Patient tolerated injection well.

## 2018-08-05 ENCOUNTER — Other Ambulatory Visit: Payer: Self-pay | Admitting: Family Medicine

## 2018-08-09 ENCOUNTER — Ambulatory Visit: Payer: Medicare Other

## 2018-08-11 ENCOUNTER — Ambulatory Visit (INDEPENDENT_AMBULATORY_CARE_PROVIDER_SITE_OTHER): Payer: Medicare Other

## 2018-08-11 DIAGNOSIS — E538 Deficiency of other specified B group vitamins: Secondary | ICD-10-CM | POA: Diagnosis not present

## 2018-08-11 MED ORDER — CYANOCOBALAMIN 1000 MCG/ML IJ SOLN
1000.0000 ug | Freq: Once | INTRAMUSCULAR | Status: AC
  Administered 2018-08-11: 1000 ug via INTRAMUSCULAR

## 2018-08-11 NOTE — Progress Notes (Signed)
Per orders of Dr. Hunter, injection of B-12 given by Malasia Torain Y Hilaria Titsworth in left deltoid. Patient tolerated injection well.  

## 2018-08-28 ENCOUNTER — Other Ambulatory Visit: Payer: Self-pay | Admitting: Cardiology

## 2018-08-28 DIAGNOSIS — I2510 Atherosclerotic heart disease of native coronary artery without angina pectoris: Secondary | ICD-10-CM

## 2018-08-28 DIAGNOSIS — Z9861 Coronary angioplasty status: Principal | ICD-10-CM

## 2018-08-28 DIAGNOSIS — Z7901 Long term (current) use of anticoagulants: Secondary | ICD-10-CM

## 2018-09-08 ENCOUNTER — Ambulatory Visit (INDEPENDENT_AMBULATORY_CARE_PROVIDER_SITE_OTHER): Payer: Medicare Other

## 2018-09-08 DIAGNOSIS — E538 Deficiency of other specified B group vitamins: Secondary | ICD-10-CM

## 2018-09-08 MED ORDER — CYANOCOBALAMIN 1000 MCG/ML IJ SOLN
1000.0000 ug | Freq: Once | INTRAMUSCULAR | Status: AC
  Administered 2018-09-08: 1000 ug via INTRAMUSCULAR

## 2018-09-08 NOTE — Patient Instructions (Signed)
Health Maintenance Due  Topic Date Due  . TETANUS/TDAP  08/06/1949  . INFLUENZA VACCINE  03/10/2018    Depression screen [facility] 2/9 03/24/2018 05/25/2017 02/13/2016  Decreased Interest 0 0 0  Down, Depressed, Hopeless 0 0 0  PHQ - 2 Score 0 0 0  Some recent data might be hidden

## 2018-09-08 NOTE — Progress Notes (Signed)
Per orders of Dr. [name], injection of B12 given by Franco Collet. Patient tolerated injection well.

## 2018-09-10 ENCOUNTER — Other Ambulatory Visit: Payer: Self-pay | Admitting: Cardiology

## 2018-09-12 ENCOUNTER — Encounter: Payer: Self-pay | Admitting: Cardiology

## 2018-09-12 ENCOUNTER — Ambulatory Visit (INDEPENDENT_AMBULATORY_CARE_PROVIDER_SITE_OTHER): Payer: Medicare Other | Admitting: Family Medicine

## 2018-09-12 ENCOUNTER — Encounter: Payer: Self-pay | Admitting: Family Medicine

## 2018-09-12 ENCOUNTER — Ambulatory Visit (INDEPENDENT_AMBULATORY_CARE_PROVIDER_SITE_OTHER): Payer: Medicare Other | Admitting: Cardiology

## 2018-09-12 ENCOUNTER — Encounter (INDEPENDENT_AMBULATORY_CARE_PROVIDER_SITE_OTHER): Payer: Self-pay

## 2018-09-12 VITALS — BP 92/62 | HR 64 | Temp 97.3°F | Ht 68.0 in | Wt 188.2 lb

## 2018-09-12 VITALS — BP 151/75 | HR 62 | Ht 68.0 in | Wt 188.6 lb

## 2018-09-12 DIAGNOSIS — I350 Nonrheumatic aortic (valve) stenosis: Secondary | ICD-10-CM | POA: Diagnosis not present

## 2018-09-12 DIAGNOSIS — N401 Enlarged prostate with lower urinary tract symptoms: Secondary | ICD-10-CM | POA: Diagnosis not present

## 2018-09-12 DIAGNOSIS — I6521 Occlusion and stenosis of right carotid artery: Secondary | ICD-10-CM

## 2018-09-12 DIAGNOSIS — R351 Nocturia: Secondary | ICD-10-CM

## 2018-09-12 DIAGNOSIS — G43009 Migraine without aura, not intractable, without status migrainosus: Secondary | ICD-10-CM | POA: Diagnosis not present

## 2018-09-12 DIAGNOSIS — Z6828 Body mass index (BMI) 28.0-28.9, adult: Secondary | ICD-10-CM | POA: Diagnosis not present

## 2018-09-12 DIAGNOSIS — Z9861 Coronary angioplasty status: Secondary | ICD-10-CM | POA: Diagnosis not present

## 2018-09-12 DIAGNOSIS — I5032 Chronic diastolic (congestive) heart failure: Secondary | ICD-10-CM

## 2018-09-12 DIAGNOSIS — I10 Essential (primary) hypertension: Secondary | ICD-10-CM

## 2018-09-12 DIAGNOSIS — I2510 Atherosclerotic heart disease of native coronary artery without angina pectoris: Secondary | ICD-10-CM

## 2018-09-12 DIAGNOSIS — N183 Chronic kidney disease, stage 3 unspecified: Secondary | ICD-10-CM

## 2018-09-12 DIAGNOSIS — E785 Hyperlipidemia, unspecified: Secondary | ICD-10-CM

## 2018-09-12 DIAGNOSIS — I779 Disorder of arteries and arterioles, unspecified: Secondary | ICD-10-CM

## 2018-09-12 DIAGNOSIS — R739 Hyperglycemia, unspecified: Secondary | ICD-10-CM | POA: Diagnosis not present

## 2018-09-12 DIAGNOSIS — R600 Localized edema: Secondary | ICD-10-CM

## 2018-09-12 DIAGNOSIS — I739 Peripheral vascular disease, unspecified: Secondary | ICD-10-CM

## 2018-09-12 DIAGNOSIS — I480 Paroxysmal atrial fibrillation: Secondary | ICD-10-CM | POA: Diagnosis not present

## 2018-09-12 LAB — CBC
HCT: 32.6 % — ABNORMAL LOW (ref 39.0–52.0)
Hemoglobin: 10.9 g/dL — ABNORMAL LOW (ref 13.0–17.0)
MCHC: 33.6 g/dL (ref 30.0–36.0)
MCV: 99.4 fl (ref 78.0–100.0)
Platelets: 227 10*3/uL (ref 150.0–400.0)
RBC: 3.28 Mil/uL — AB (ref 4.22–5.81)
RDW: 13.1 % (ref 11.5–15.5)
WBC: 9.6 10*3/uL (ref 4.0–10.5)

## 2018-09-12 LAB — TSH: TSH: 2.05 u[IU]/mL (ref 0.35–4.50)

## 2018-09-12 LAB — COMPREHENSIVE METABOLIC PANEL
ALT: 9 U/L (ref 0–53)
AST: 12 U/L (ref 0–37)
Albumin: 3.4 g/dL — ABNORMAL LOW (ref 3.5–5.2)
Alkaline Phosphatase: 54 U/L (ref 39–117)
BUN: 39 mg/dL — AB (ref 6–23)
CO2: 25 mEq/L (ref 19–32)
Calcium: 8.6 mg/dL (ref 8.4–10.5)
Chloride: 111 mEq/L (ref 96–112)
Creatinine, Ser: 1.9 mg/dL — ABNORMAL HIGH (ref 0.40–1.50)
GFR: 33.77 mL/min — ABNORMAL LOW (ref >60.00)
Glucose, Bld: 154 mg/dL — ABNORMAL HIGH (ref 70–99)
Potassium: 4.2 mEq/L (ref 3.5–5.1)
SODIUM: 142 meq/L (ref 135–145)
Total Bilirubin: 0.3 mg/dL (ref 0.2–1.2)
Total Protein: 6 g/dL (ref 6.0–8.3)

## 2018-09-12 LAB — LDL CHOLESTEROL, DIRECT: Direct LDL: 52 mg/dL

## 2018-09-12 LAB — HEMOGLOBIN A1C: Hgb A1c MFr Bld: 5.9 % (ref 4.6–6.5)
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
## 2018-09-12 NOTE — Progress Notes (Signed)
Phone [phone]   Subjective:  Mark Jackson. is a 85 y.o. year old very pleasant male patient who presents for/with See problem oriented charting ROS- No chest pain or shortness of breath. No headache or blurry vision.    BMI monitoring- elevated BMI noted: Body mass index is 28.62 kg/m. Encouraged need for healthy eating, regular exercise, weight loss.   BMI Metric Follow Up - 09/12/18 1351      BMI Metric Follow Up-Please document annually   BMI Metric Follow Up  Education provided     exercising 2x a week- encouraged to continue Encouraged to continue to try to eat healthy diet- he admits really enjoys eating certain foods and lacks variety  Past Medical History-  Patient Active Problem List   Diagnosis Date Noted  . CAD S/P DES PCI to proximal LAD 03/22/2015    Priority: High  . Chronic diastolic heart failure ([place]) 08/23/2014    Priority: High  . Paroxysmal atrial fibrillation ([employer]); CHA2DSVasc - 4; Now on Eliquis 08/20/2014    Priority: High    Class: Diagnosis of  . CKD (chronic kidney disease) stage 3, GFR 30-59 ml/min (HCC) 08/20/2014    Priority: High  . Personal history of prostate cancer 10/22/2008    Priority: High  . Hyperglycemia 09/27/2017    Priority: Medium  . B12 deficiency 01/06/2017    Priority: Medium  . BPH associated with nocturia 06/15/2016    Priority: Medium  . Moderate aortic stenosis by prior echocardiogram 03/14/2015    Priority: Medium  . Hereditary and idiopathic peripheral neuropathy 01/12/2014    Priority: Medium  . H/O syncope 09/03/2013    Priority: Medium  . Right-sided carotid artery disease; followed by Dr. Ellyn Hack 03/02/2013    Priority: Medium  . Hyperlipidemia with target LDL less than 70 03/02/2013    Priority: Medium  . Migraine without aura 10/26/2012    Priority: Medium  . Anemia 10/23/2008    Priority: Medium  . Essential hypertension 10/22/2008    Priority: Medium  . Sleep apnea 10/22/2008    Priority:  Medium  . Fatigue 11/08/2017    Priority: Low  . Chronic diarrhea 01/06/2017    Priority: Low  . Perianal dermatitis 06/19/2015    Priority: Low  . Rectal bleeding 04/25/2015    Priority: Low  . Chest pain with moderate risk for cardiac etiology     Priority: Low  . Abnormal finding on EKG 08/31/2014    Priority: Low  . Dyspnea 08/31/2014    Priority: Low  . Long term current use of anticoagulant therapy 08/27/2014    Priority: Low  . Obesity (BMI 30-39.9) 09/03/2013    Priority: Low  . GLAUCOMA 10/23/2008    Priority: Low  . Hemorrhoids 10/22/2008    Priority: Low  . Arthropathy 10/22/2008    Priority: Low  . History of colonic polyps 10/22/2008    Priority: Low  . Bilateral lower extremity edema 04/15/2017    Medications- reviewed and updated Current Outpatient Medications  Medication Sig Dispense Refill  . Ca Carbonate-Mag Hydroxide (ROLAIDS PO) Take 1 tablet by mouth daily as needed (acid reflux).    . cycloSPORINE (RESTASIS) 0.05 % ophthalmic emulsion Place 1 drop into both eyes daily as needed.     . diltiazem (CARDIZEM CD) 180 MG 24 hr capsule TAKE (1) CAPSULE DAILY. 90 capsule 1  . ELIQUIS 2.5 MG TABS tablet TAKE 1 TABLET BY MOUTH TWICE DAILY. 60 tablet 3  . ferrous sulfate 325 (  65 FE) MG tablet Take 325 mg by mouth daily with breakfast.    . furosemide (LASIX) 20 MG tablet TAKE ONE TABLET DAILY-MAY TAKE AN ADDITIONAL EXTRA TABLET DAILY IF NEEDED FOR SWELLING. 45 tablet 3  . hydrocortisone (ANUSOL-HC) 2.5 % rectal cream Place 1 application rectally 2 (two) times daily. Please give pt applicators. Thank you. 30 g 1  . loperamide (IMODIUM A-D) 2 MG tablet Take 2 mg by mouth 4 (four) times daily as needed for diarrhea or loose stools.    Marland Kitchen NITROSTAT 0.4 MG SL tablet ONE TABLET UNDER TONGUE WHEN NEEDED FOR CHEST PAIN. MAY REPEAT IN 5 MINUTES. 25 tablet 4  . rosuvastatin (CRESTOR) 20 MG tablet TAKE 1 TABLET ONCE DAILY. 90 tablet 3  . topiramate (TOPAMAX) 200 MG tablet  Take 1 tablet (200 mg total) by mouth daily. 90 tablet 3  . clopidogrel (PLAVIX) 75 MG tablet TAKE 1 TABLET EVERY DAY WITH BREAKFAST. 90 tablet 0   No current facility-administered medications for this visit.      Objective:  BP 92/62 (BP Location: Left Arm, Patient Position: Sitting, Cuff Size: Large)   Pulse 64   Temp (!) 97.3 F (36.3 C) (Oral)   Ht 5\' 8"  (1.727 m)   Wt 188 lb 3.2 oz (85.4 kg)   SpO2 98%   BMI 28.62 kg/m  Gen: NAD, resting comfortably CV: RRR. 3/6 SEM. No  rubs or gallops Lungs: CTAB no crackles, wheeze, rhonchi Abdomen: soft/nontender/nondistended/normal bowel sounds.  Ext: 1+ edema Skin: warm, dry    Assessment and Plan   Other notes: 1.  hemorrhoid ok. Imodium has not needed lately.  2.  sleeps too much, cold often-evaluate CBC and TSH  -On iron and b12  Essential hypertension S: controlled on Diltiazem 180mg  XR, lasix 20mg  BP Readings from Last 3 Encounters:  09/12/18 92/62  09/12/18 (!) 151/75  05/11/18 120/80  A/P: We discussed blood pressure goal of <140/90. Continue current meds: Was just elevated at the cardiology office before this visit-patient denies orthostatic symptoms-he will follow-up if these developed    CKD (chronic kidney disease) stage 3, GFR 30-59 ml/min (HCC) S: GFR has been in the low 30s for the most part with creatinine around 2.  Follows with Dr. Moshe Cipro.  Knows to avoid NSAIDs A/P:  Stable. Continue current medications.  Continue risk factor modification  Hyperlipidemia with target LDL less than 70 S: Well controlled on Crestor 20 mg with LDL under 60 today.  Has right-sided carotid artery disease which has been stable  A/P:  Stable. Continue current medications.   - Reasonable to keep tight control with CAD, CKD, right-sided carotid artery disease- followed by Dr. Ellyn Hack with last in May 2019.  Right side is 60 to 79% stenosed  Migraine without aura Doing well on Topamax-gets migraines if misses doses  BPH  associated with nocturia 5x a night urinating  Fall risk so avoid Flomax Asks about super beta prostate-we discussed no clear data on this Declines flomax or finasteride-due to risks   Future Appointments  Date Time Provider [place]  10/06/2018 10:30 AM [facility] NURSE [facility]  10/14/2018 11:30 AM [facility] E[id number]  03/16/2019 11:00 AM [name], MD [facility]  Six-month follow-up planned  Lab/Order associations: Hyperlipidemia with target LDL less than 70 - Plan: CBC, Comprehensive metabolic panel, LDL cholesterol, direct, TSH  Hyperglycemia - Plan: Hemoglobin A1c  BMI 28.0-28.9,adult  Essential hypertension  CKD (chronic kidney disease) stage 3, GFR  30-59 ml/min (HCC)  Right-sided carotid artery disease, unspecified type ([place]), Chronic  Migraine without aura and without status migrainosus, not intractable  BPH associated with nocturia   Return precautions advised.  [name], MD

## 2018-09-12 NOTE — Telephone Encounter (Signed)
Rx request sent to pharmacy.  

## 2018-09-12 NOTE — Patient Instructions (Addendum)
Medication Instructions:  NOT NEEDED If you need a refill on your cardiac medications before your next appointment, please call your pharmacy.   Recommend using zipper support stockings  Lab work: NOT NEEDED If you have labs (blood work) drawn today and your tests are completely normal, you will receive your results only by: Marland Kitchen MyChart Message (if you have MyChart) OR . A paper copy in the mail If you have any lab test that is abnormal or we need to change your treatment, we will call you to review the results.  Testing/Procedures: SCHEDULE AT [place] MARCH 2020 Your physician has requested that you have an echocardiogram. Echocardiography is a painless test that uses sound waves to create images of your heart. It provides your doctor with information about the size and shape of your heart and how well your heart's chambers and valves are working. This procedure takes approximately one hour. There are no restrictions for this procedure.    Follow-Up: At [facility], you and your health needs are our priority.  As part of our continuing mission to provide you with exceptional heart care, we have created designated Provider Care Teams.  These Care Teams include your primary Cardiologist (physician) and Advanced Practice Providers (APPs -  Physician Assistants and Nurse Practitioners) who all work together to provide you with the care you need, when you need it. You will need a follow up appointment in 6 months AUG 2020.  Please call our office 2 months in advance to schedule this appointment.  You may see [name], MD  or one of the following Advanced Practice Providers on your designated Care Team:   Rosaria Ferries, PA-C . [name], DNP, ANP  Any Other Special Instructions Will Be Listed Below (If Applicable).

## 2018-09-12 NOTE — Patient Instructions (Addendum)
Health Maintenance Due  Topic Date Due  . TETANUS/TDAP -please let me know the date you received this 08/06/1949   Please stop by lab before you go If you do not have mychart- we will call you about results within 5 business days of [place] receiving them.  If you have mychart- we will send your results within 3 business days of [place] receiving them.  If abnormal or we want to clarify a result, we will call or mychart you to make sure you receive the message.  If you have questions or concerns or don't hear within 5-7 days, please send [place] a message or call us.

## 2018-09-13 ENCOUNTER — Encounter: Payer: Self-pay | Admitting: Cardiology

## 2018-09-13 NOTE — Assessment & Plan Note (Signed)
Progression from mild to moderate by last echo.  Plan had been to follow-up every 2 years, but with recent progression, will check an echo this May.  Depending on stability, would probably consider again waiting 2 years.

## 2018-09-13 NOTE — Assessment & Plan Note (Signed)
Pretty stable.  I did recommend support stockings.  I actually recommended that he try the zipper stockings.

## 2018-09-13 NOTE — Assessment & Plan Note (Addendum)
Blood pressures are actually pretty stable.  I am fine with the current pressure where it is because he tells me at home that SBP is usually in the 130s to 140s.  I would be reluctant to be too aggressive because of his prior syncopal episode and concerns for orthostasis.  Simply for now continue diltiazem without additional control.  Tolerate mild permissive hypertension as long as systolic blood pressures are not consistently over 160 mmHg.

## 2018-09-13 NOTE — Assessment & Plan Note (Signed)
Is more tolerant of diltiazem the beta-blocker.  Therefore is on long-acting diltiazem 180mg  for rate control.  On apixaban for anticoagulation low-dose (age, renal disease and being on Plavix).  He has bruising, no significant bleed. Plan is to rate control if he has episodes, but has not had any recurrent symptoms. ->  Potentially cardiovert if recurrent.

## 2018-09-13 NOTE — Assessment & Plan Note (Signed)
Overlapping LAD stents. Negative/nonischemic Myoview in October with normal EF.  Currently remains on Plavix.  With overlapping stents, we have continued this, however low threshold for stopping if there is significant bleeding.  Would be okay to hold for procedures 5 to 7 days. Not on beta-blocker because of intolerance and fatigue but is on diltiazem long-acting.  Also on statin stable dose.

## 2018-09-13 NOTE — Assessment & Plan Note (Signed)
Remains stable.  Followed annually with Dopplers.  Right internal carotid 60 to 79% by last Doppler.  Continue risk factor modification with.  Is already on Plavix and statin.

## 2018-09-13 NOTE — Assessment & Plan Note (Signed)
Remains on 20 mg Crestor.  Tolerating well.  Restarted after short hiatus.  Most recent check shows well-controlled lipids with LDL of 52  --essentially at goal.

## 2018-09-13 NOTE — Assessment & Plan Note (Signed)
Relatively stable overall.  Edema is probably more venous stasis related. Intolerant of beta-blocker defer on diltiazem.  Not on additional afterload reduction because of concerns for orthostasis and prior syncope. He is on standing dose of Lasix which he should be to take as needed but he is not.

## 2018-09-14 ENCOUNTER — Ambulatory Visit: Payer: Medicare Other | Admitting: Family Medicine

## 2018-09-14 NOTE — Assessment & Plan Note (Signed)
5x a night urinating  Fall risk so avoid Flomax Asks about super beta prostate-we discussed no clear data on this Declines flomax or finasteride-due to risks

## 2018-09-14 NOTE — Assessment & Plan Note (Addendum)
S: Well controlled on Crestor 20 mg with LDL under 60 today.  Has right-sided carotid artery disease which has been stable  A/P:  Stable. Continue current medications.   - Reasonable to keep tight control with CAD, CKD, right-sided carotid artery disease- followed by Dr. Ellyn Hack with last in May 2019.  Right side is 60 to 79% stenosed

## 2018-09-14 NOTE — Assessment & Plan Note (Signed)
Doing well on Topamax-gets migraines if misses doses

## 2018-09-14 NOTE — Assessment & Plan Note (Signed)
S: controlled on Diltiazem 180mg  XR, lasix 20mg  BP Readings from Last 3 Encounters:  09/12/18 92/62  09/12/18 (!) 151/75  05/11/18 120/80  A/P: We discussed blood pressure goal of <140/90. Continue current meds: Was just elevated at the cardiology office before this visit-patient denies orthostatic symptoms-he will follow-up if these developed

## 2018-09-14 NOTE — Assessment & Plan Note (Signed)
S: GFR has been in the low 30s for the most part with creatinine around 2.  Follows with Dr. Moshe Cipro.  Knows to avoid NSAIDs A/P:  Stable. Continue current medications.  Continue risk factor modification

## 2018-09-22 DIAGNOSIS — N183 Chronic kidney disease, stage 3 (moderate): Secondary | ICD-10-CM | POA: Diagnosis not present

## 2018-09-22 DIAGNOSIS — Z6828 Body mass index (BMI) 28.0-28.9, adult: Secondary | ICD-10-CM | POA: Diagnosis not present

## 2018-09-22 DIAGNOSIS — D631 Anemia in chronic kidney disease: Secondary | ICD-10-CM | POA: Diagnosis not present

## 2018-10-06 ENCOUNTER — Ambulatory Visit (INDEPENDENT_AMBULATORY_CARE_PROVIDER_SITE_OTHER): Payer: Medicare Other

## 2018-10-06 DIAGNOSIS — E538 Deficiency of other specified B group vitamins: Secondary | ICD-10-CM | POA: Diagnosis not present

## 2018-10-06 MED ORDER — CYANOCOBALAMIN 1000 MCG/ML IJ SOLN
1000.0000 ug | Freq: Once | INTRAMUSCULAR | Status: AC
  Administered 2018-10-06: 1000 ug via INTRAMUSCULAR

## 2018-10-06 NOTE — Progress Notes (Signed)
I have reviewed and agree with note, evaluation, plan.   Sameena Artus, MD  

## 2018-10-06 NOTE — Progress Notes (Signed)
Per orders of Dr. [name], injection of b-12 given by Francella Solian in left deltoid. Patient tolerated injection well. Patient will make appointment for 67month

## 2018-10-14 ENCOUNTER — Ambulatory Visit (HOSPITAL_COMMUNITY): Payer: Medicare Other | Attending: Internal Medicine

## 2018-10-14 DIAGNOSIS — I480 Paroxysmal atrial fibrillation: Secondary | ICD-10-CM | POA: Insufficient documentation

## 2018-10-14 DIAGNOSIS — I350 Nonrheumatic aortic (valve) stenosis: Secondary | ICD-10-CM | POA: Insufficient documentation

## 2018-10-21 ENCOUNTER — Telehealth: Payer: Self-pay | Admitting: *Deleted

## 2018-10-21 DIAGNOSIS — I350 Nonrheumatic aortic (valve) stenosis: Secondary | ICD-10-CM

## 2018-10-21 DIAGNOSIS — I5032 Chronic diastolic (congestive) heart failure: Secondary | ICD-10-CM

## 2018-10-21 DIAGNOSIS — I340 Nonrheumatic mitral (valve) insufficiency: Secondary | ICD-10-CM

## 2018-10-21 NOTE — Telephone Encounter (Signed)
The patient has been notified of the result and verbalized understanding.  All questions (if any) were answered. Raiford Simmonds, RN 10/21/2018 5:01 PM  ORDER PLACED FOR 12 MONTH 2021

## 2018-10-21 NOTE — Telephone Encounter (Signed)
-----   Message from [name], MD sent at 10/18/2018  6:47 PM EDT ----- Echocardiogram result: Great news, ejection fraction is pretty normal at 60 to 65%.  No wall motion normalities.  Probably abnormal (but normal for age) diastolic function. Mild to moderate mitral regurgitation.  Probably moderate calcific aortic stenosis. -->  Trivial increase in gradients compared to last echo.  Stable -- can wait ~1+ year to follow-up.  [name], MD

## 2018-10-24 DIAGNOSIS — L570 Actinic keratosis: Secondary | ICD-10-CM | POA: Diagnosis not present

## 2018-10-24 DIAGNOSIS — L821 Other seborrheic keratosis: Secondary | ICD-10-CM | POA: Diagnosis not present

## 2018-10-24 DIAGNOSIS — D2371 Other benign neoplasm of skin of right lower limb, including hip: Secondary | ICD-10-CM | POA: Diagnosis not present

## 2018-10-24 DIAGNOSIS — D692 Other nonthrombocytopenic purpura: Secondary | ICD-10-CM | POA: Diagnosis not present

## 2018-10-24 DIAGNOSIS — L565 Disseminated superficial actinic porokeratosis (DSAP): Secondary | ICD-10-CM | POA: Diagnosis not present

## 2018-10-24 DIAGNOSIS — I8391 Asymptomatic varicose veins of right lower extremity: Secondary | ICD-10-CM | POA: Diagnosis not present

## 2018-10-24 DIAGNOSIS — Z85828 Personal history of other malignant neoplasm of skin: Secondary | ICD-10-CM | POA: Diagnosis not present

## 2018-11-05 ENCOUNTER — Other Ambulatory Visit: Payer: Self-pay | Admitting: Physician Assistant

## 2018-11-09 ENCOUNTER — Ambulatory Visit: Payer: Medicare Other

## 2018-11-23 ENCOUNTER — Ambulatory Visit (INDEPENDENT_AMBULATORY_CARE_PROVIDER_SITE_OTHER): Payer: Medicare Other

## 2018-11-23 ENCOUNTER — Other Ambulatory Visit: Payer: Self-pay

## 2018-11-23 DIAGNOSIS — E538 Deficiency of other specified B group vitamins: Secondary | ICD-10-CM

## 2018-11-23 MED ORDER — CYANOCOBALAMIN 1000 MCG/ML IJ SOLN
1000.0000 ug | Freq: Once | INTRAMUSCULAR | Status: AC
  Administered 2018-11-23: 10:00:00 1000 ug via INTRAMUSCULAR

## 2018-11-23 NOTE — Progress Notes (Signed)
Cyanocobalamin 1000 mcg/mL, 1 mL given IM, right deltoid, mfg: [employer], lot# [id number], exp [id number], ndc [id number], pt tolerated well.

## 2018-12-15 ENCOUNTER — Other Ambulatory Visit: Payer: Self-pay | Admitting: Cardiology

## 2018-12-26 ENCOUNTER — Other Ambulatory Visit: Payer: Self-pay | Admitting: Cardiology

## 2018-12-26 DIAGNOSIS — Z7901 Long term (current) use of anticoagulants: Secondary | ICD-10-CM

## 2018-12-26 DIAGNOSIS — I2510 Atherosclerotic heart disease of native coronary artery without angina pectoris: Secondary | ICD-10-CM

## 2018-12-26 NOTE — Telephone Encounter (Signed)
85yo Scr = 1.90 Wt = 85.4kg OV 09/12/2018 with Dr Ellyn Hack

## 2019-01-03 ENCOUNTER — Other Ambulatory Visit: Payer: Self-pay | Admitting: Cardiology

## 2019-01-05 ENCOUNTER — Other Ambulatory Visit: Payer: Self-pay

## 2019-01-05 ENCOUNTER — Ambulatory Visit (INDEPENDENT_AMBULATORY_CARE_PROVIDER_SITE_OTHER): Payer: Medicare Other | Admitting: Family Medicine

## 2019-01-05 DIAGNOSIS — E538 Deficiency of other specified B group vitamins: Secondary | ICD-10-CM

## 2019-01-05 MED ORDER — CYANOCOBALAMIN 1000 MCG/ML IJ SOLN
1000.0000 ug | Freq: Once | INTRAMUSCULAR | Status: AC
  Administered 2019-01-05: 1000 ug via INTRAMUSCULAR

## 2019-01-05 NOTE — Patient Instructions (Signed)
Health Maintenance Due  Topic Date Due  . TETANUS/TDAP Patient was notified that he is due for vaccination  08/06/1949    Depression screen [facility] 2/9 09/12/2018 03/24/2018 05/25/2017  Decreased Interest 0 0 0  Down, Depressed, Hopeless 0 0 0  PHQ - 2 Score 0 0 0  Some recent data might be hidden

## 2019-01-05 NOTE — Progress Notes (Signed)
b12 injection given by team- assume documentation will follow  Mark Jackson

## 2019-01-11 ENCOUNTER — Other Ambulatory Visit: Payer: Self-pay

## 2019-01-11 ENCOUNTER — Ambulatory Visit (HOSPITAL_COMMUNITY)
Admission: RE | Admit: 2019-01-11 | Discharge: 2019-01-11 | Disposition: A | Discharge status: Home / Self Care | Payer: Medicare Other | Source: Ambulatory Visit | Attending: Cardiology | Admitting: Cardiology

## 2019-01-11 ENCOUNTER — Other Ambulatory Visit (HOSPITAL_COMMUNITY): Payer: Self-pay | Admitting: Cardiology

## 2019-01-11 DIAGNOSIS — I6523 Occlusion and stenosis of bilateral carotid arteries: Secondary | ICD-10-CM | POA: Diagnosis not present

## 2019-02-06 ENCOUNTER — Other Ambulatory Visit: Payer: Self-pay | Admitting: Physician Assistant

## 2019-02-06 ENCOUNTER — Ambulatory Visit: Payer: Medicare Other | Admitting: Family Medicine

## 2019-02-06 ENCOUNTER — Ambulatory Visit (INDEPENDENT_AMBULATORY_CARE_PROVIDER_SITE_OTHER): Payer: Medicare Other

## 2019-02-06 ENCOUNTER — Other Ambulatory Visit: Payer: Self-pay

## 2019-02-06 DIAGNOSIS — E538 Deficiency of other specified B group vitamins: Secondary | ICD-10-CM

## 2019-02-06 MED ORDER — CYANOCOBALAMIN 1000 MCG/ML IJ SOLN
1000.0000 ug | Freq: Once | INTRAMUSCULAR | Status: AC
  Administered 2019-02-06: 1000 ug via INTRAMUSCULAR

## 2019-02-06 NOTE — Progress Notes (Signed)
I have reviewed and agree with note, evaluation, plan.   Sammantha Mehlhaff, MD  

## 2019-02-06 NOTE — Progress Notes (Signed)
Per orders of Dr. [name], injection of Vitamin b12 1000 mcg given left deltoid IM by Kevan Ny, CMA Patient tolerated injection well.  He will return in 1 month for his next injection

## 2019-02-07 ENCOUNTER — Telehealth: Payer: Self-pay | Admitting: *Deleted

## 2019-02-07 DIAGNOSIS — I6521 Occlusion and stenosis of right carotid artery: Secondary | ICD-10-CM

## 2019-02-07 NOTE — Telephone Encounter (Signed)
SPOKE WITH PATIENT,HE IS  AWARE , NEEDS APPOINTMENT,  WITH VASCULAR SURGEON REFERRAL PLACED [facility]  Patient is aware [facility] will call him for an appointment.

## 2019-02-07 NOTE — Telephone Encounter (Signed)
-----   Message from [name], MD sent at 02/06/2019  5:25 PM EDT ----- From Dr. Gwenlyn Found --  Doppler shows signif RICA stenosis. Prob should refer to vasc surgery for consultation re CEA   JJB   His suggestion is that Mr. Tonna Corner would probably do better with carotid endarterectomy than stent.   [name], MD

## 2019-02-12 ENCOUNTER — Other Ambulatory Visit: Payer: Self-pay | Admitting: Family Medicine

## 2019-02-12 DIAGNOSIS — G43009 Migraine without aura, not intractable, without status migrainosus: Secondary | ICD-10-CM

## 2019-02-13 NOTE — Telephone Encounter (Signed)
Patient need to schedule an ov for more refills. 

## 2019-02-14 NOTE — Telephone Encounter (Signed)
Last OV 09/12/18

## 2019-02-18 ENCOUNTER — Other Ambulatory Visit: Payer: Self-pay | Admitting: Cardiology

## 2019-02-24 ENCOUNTER — Telehealth: Payer: Self-pay | Admitting: Cardiology

## 2019-02-24 DIAGNOSIS — R351 Nocturia: Secondary | ICD-10-CM | POA: Diagnosis not present

## 2019-02-24 DIAGNOSIS — Z8546 Personal history of malignant neoplasm of prostate: Secondary | ICD-10-CM | POA: Diagnosis not present

## 2019-02-24 NOTE — Telephone Encounter (Signed)
Called [facility] and this referral is waiting to get scheduled by their schedulers at [facility].

## 2019-02-28 ENCOUNTER — Telehealth: Payer: Self-pay | Admitting: Cardiology

## 2019-02-28 NOTE — Telephone Encounter (Signed)
Called [facility] to find out about Dr. Trula Slade referral.  They said they will call the patient to schedule/

## 2019-03-02 ENCOUNTER — Encounter (HOSPITAL_BASED_OUTPATIENT_CLINIC_OR_DEPARTMENT_OTHER): Payer: Self-pay | Admitting: *Deleted

## 2019-03-02 ENCOUNTER — Emergency Department (HOSPITAL_BASED_OUTPATIENT_CLINIC_OR_DEPARTMENT_OTHER): Payer: Medicare Other

## 2019-03-02 ENCOUNTER — Emergency Department (HOSPITAL_BASED_OUTPATIENT_CLINIC_OR_DEPARTMENT_OTHER)
Admission: EM | Admit: 2019-03-02 | Discharge: 2019-03-02 | Disposition: A | Discharge status: Home / Self Care | Payer: Medicare Other | Attending: Emergency Medicine | Admitting: Emergency Medicine

## 2019-03-02 ENCOUNTER — Other Ambulatory Visit: Payer: Self-pay

## 2019-03-02 DIAGNOSIS — M25551 Pain in right hip: Secondary | ICD-10-CM | POA: Insufficient documentation

## 2019-03-02 DIAGNOSIS — I252 Old myocardial infarction: Secondary | ICD-10-CM | POA: Insufficient documentation

## 2019-03-02 DIAGNOSIS — W01198A Fall on same level from slipping, tripping and stumbling with subsequent striking against other object, initial encounter: Secondary | ICD-10-CM | POA: Insufficient documentation

## 2019-03-02 DIAGNOSIS — Y9389 Activity, other specified: Secondary | ICD-10-CM | POA: Insufficient documentation

## 2019-03-02 DIAGNOSIS — Y998 Other external cause status: Secondary | ICD-10-CM | POA: Diagnosis not present

## 2019-03-02 DIAGNOSIS — I2510 Atherosclerotic heart disease of native coronary artery without angina pectoris: Secondary | ICD-10-CM | POA: Insufficient documentation

## 2019-03-02 DIAGNOSIS — Z85828 Personal history of other malignant neoplasm of skin: Secondary | ICD-10-CM | POA: Diagnosis not present

## 2019-03-02 DIAGNOSIS — R600 Localized edema: Secondary | ICD-10-CM | POA: Diagnosis not present

## 2019-03-02 DIAGNOSIS — Z23 Encounter for immunization: Secondary | ICD-10-CM | POA: Insufficient documentation

## 2019-03-02 DIAGNOSIS — Z955 Presence of coronary angioplasty implant and graft: Secondary | ICD-10-CM | POA: Diagnosis not present

## 2019-03-02 DIAGNOSIS — S51012A Laceration without foreign body of left elbow, initial encounter: Secondary | ICD-10-CM | POA: Diagnosis not present

## 2019-03-02 DIAGNOSIS — S59902A Unspecified injury of left elbow, initial encounter: Secondary | ICD-10-CM | POA: Diagnosis not present

## 2019-03-02 DIAGNOSIS — Z87891 Personal history of nicotine dependence: Secondary | ICD-10-CM | POA: Diagnosis not present

## 2019-03-02 DIAGNOSIS — Z96651 Presence of right artificial knee joint: Secondary | ICD-10-CM | POA: Insufficient documentation

## 2019-03-02 DIAGNOSIS — Z7689 Persons encountering health services in other specified circumstances: Secondary | ICD-10-CM

## 2019-03-02 DIAGNOSIS — N183 Chronic kidney disease, stage 3 (moderate): Secondary | ICD-10-CM | POA: Diagnosis not present

## 2019-03-02 DIAGNOSIS — W19XXXA Unspecified fall, initial encounter: Secondary | ICD-10-CM

## 2019-03-02 DIAGNOSIS — Y92018 Other place in single-family (private) house as the place of occurrence of the external cause: Secondary | ICD-10-CM | POA: Diagnosis not present

## 2019-03-02 DIAGNOSIS — I129 Hypertensive chronic kidney disease with stage 1 through stage 4 chronic kidney disease, or unspecified chronic kidney disease: Secondary | ICD-10-CM | POA: Diagnosis not present

## 2019-03-02 MED ORDER — TETANUS-DIPHTH-ACELL PERTUSSIS 5-2.5-18.5 LF-MCG/0.5 IM SUSP
0.5000 mL | Freq: Once | INTRAMUSCULAR | Status: AC
  Administered 2019-03-02: 0.5 mL via INTRAMUSCULAR
  Filled 2019-03-02: qty 0.5
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
## 2019-03-02 NOTE — Discharge Instructions (Signed)
You were seen in the emergency department today after a fall.  Your x-ray of the elbow was normal with no fracture.  Please keep the area clean and dry.  He can remove the Steri-Strips in 5 to 7 days.  Return to the emergency department with any new or suddenly worsening symptoms.

## 2019-03-02 NOTE — ED Notes (Signed)
Patient transported to X-ray 

## 2019-03-02 NOTE — ED Triage Notes (Signed)
He was picking up a box and slipped on wet steps. Injury to his left elbow. Laceration.

## 2019-03-02 NOTE — ED Notes (Signed)
ED Provider at bedside. 

## 2019-03-07 ENCOUNTER — Ambulatory Visit: Payer: Medicare Other

## 2019-03-08 ENCOUNTER — Other Ambulatory Visit: Payer: Self-pay | Admitting: Cardiology

## 2019-03-15 ENCOUNTER — Other Ambulatory Visit: Payer: Self-pay | Admitting: Family Medicine

## 2019-03-15 DIAGNOSIS — Z20822 Contact with and (suspected) exposure to covid-19: Secondary | ICD-10-CM

## 2019-03-16 ENCOUNTER — Ambulatory Visit (INDEPENDENT_AMBULATORY_CARE_PROVIDER_SITE_OTHER): Payer: Medicare Other | Admitting: Family Medicine

## 2019-03-16 ENCOUNTER — Other Ambulatory Visit: Payer: Self-pay

## 2019-03-16 ENCOUNTER — Encounter: Payer: Self-pay | Admitting: Family Medicine

## 2019-03-16 VITALS — BP 132/74 | HR 64 | Temp 98.6°F | Ht 68.0 in | Wt 182.8 lb

## 2019-03-16 DIAGNOSIS — Z9861 Coronary angioplasty status: Secondary | ICD-10-CM | POA: Diagnosis not present

## 2019-03-16 DIAGNOSIS — I10 Essential (primary) hypertension: Secondary | ICD-10-CM | POA: Diagnosis not present

## 2019-03-16 DIAGNOSIS — I2510 Atherosclerotic heart disease of native coronary artery without angina pectoris: Secondary | ICD-10-CM | POA: Diagnosis not present

## 2019-03-16 DIAGNOSIS — I5032 Chronic diastolic (congestive) heart failure: Secondary | ICD-10-CM | POA: Diagnosis not present

## 2019-03-16 DIAGNOSIS — E785 Hyperlipidemia, unspecified: Secondary | ICD-10-CM

## 2019-03-16 DIAGNOSIS — I480 Paroxysmal atrial fibrillation: Secondary | ICD-10-CM

## 2019-03-16 DIAGNOSIS — N184 Chronic kidney disease, stage 4 (severe): Secondary | ICD-10-CM | POA: Diagnosis not present

## 2019-03-16 DIAGNOSIS — I6523 Occlusion and stenosis of bilateral carotid arteries: Secondary | ICD-10-CM

## 2019-03-16 DIAGNOSIS — E538 Deficiency of other specified B group vitamins: Secondary | ICD-10-CM | POA: Diagnosis not present

## 2019-03-16 DIAGNOSIS — D649 Anemia, unspecified: Secondary | ICD-10-CM

## 2019-03-16 DIAGNOSIS — R739 Hyperglycemia, unspecified: Secondary | ICD-10-CM

## 2019-03-16 LAB — IBC + FERRITIN
Ferritin: 85.5 ng/mL (ref 22.0–322.0)
Iron: 105 ug/dL (ref 42–165)
Saturation Ratios: 46.9 % (ref 20.0–50.0)
Transferrin: 160 mg/dL — ABNORMAL LOW (ref 212.0–360.0)

## 2019-03-16 LAB — COMPREHENSIVE METABOLIC PANEL
ALT: 10 U/L (ref 0–53)
AST: 15 U/L (ref 0–37)
Albumin: 3.8 g/dL (ref 3.5–5.2)
Alkaline Phosphatase: 55 U/L (ref 39–117)
BUN: 50 mg/dL — ABNORMAL HIGH (ref 6–23)
CO2: 23 mEq/L (ref 19–32)
Calcium: 8.9 mg/dL (ref 8.4–10.5)
Chloride: 110 mEq/L (ref 96–112)
Creatinine, Ser: 2.42 mg/dL — ABNORMAL HIGH (ref 0.40–1.50)
GFR: 25.51 mL/min — ABNORMAL LOW (ref >60.00)
Glucose, Bld: 86 mg/dL (ref 70–99)
Potassium: 5.1 mEq/L (ref 3.5–5.1)
Sodium: 141 mEq/L (ref 135–145)
Total Bilirubin: 0.5 mg/dL (ref 0.2–1.2)
Total Protein: 6.4 g/dL (ref 6.0–8.3)

## 2019-03-16 LAB — CBC
HCT: 33.8 % — ABNORMAL LOW (ref 39.0–52.0)
Hemoglobin: 11.4 g/dL — ABNORMAL LOW (ref 13.0–17.0)
MCHC: 33.7 g/dL (ref 30.0–36.0)
MCV: 99.6 fl (ref 78.0–100.0)
Platelets: 226 10*3/uL (ref 150.0–400.0)
RBC: 3.39 Mil/uL — ABNORMAL LOW (ref 4.22–5.81)
RDW: 13.3 % (ref 11.5–15.5)
WBC: 9.3 10*3/uL (ref 4.0–10.5)

## 2019-03-16 LAB — LIPID PANEL
Cholesterol: 110 mg/dL (ref 0–200)
HDL: 41.1 mg/dL (ref >39.00)
LDL Cholesterol: 54 mg/dL (ref 0–99)
NonHDL: 68.61
Total CHOL/HDL Ratio: 3
Triglycerides: 75 mg/dL (ref 0.0–149.0)
VLDL: 15 mg/dL (ref 0.0–40.0)

## 2019-03-16 LAB — HEMOGLOBIN A1C: Hgb A1c MFr Bld: 6 % (ref 4.6–6.5)

## 2019-03-16 LAB — VITAMIN B12: Vitamin B-12: >1500 pg/mL — ABNORMAL HIGH (ref 211–911)

## 2019-03-16 MED ORDER — CYANOCOBALAMIN 1000 MCG/ML IJ SOLN
1000.0000 ug | Freq: Once | INTRAMUSCULAR | Status: AC
  Administered 2019-03-16: 12:00:00 1000 ug via INTRAMUSCULAR

## 2019-03-16 NOTE — Progress Notes (Signed)
Phone [phone]   Subjective:  Mark Jackson. is a 85 y.o. year old very pleasant male patient who presents for/with See problem oriented charting Chief Complaint  Patient presents with  . Follow-up    Not fasting today.   . B12 Deficiency  . Chronic Kidney Disease  . Migraine  . Hyperglycemia  . Hypertension  . Hyperlipidemia  . Hemorrhoids  . Benign Prostatic Hypertrophy   ROS- Denies dizziness, visual changes. Has migraines.  Some SOB with walking but stable over 6 months. Occasional chest pain relieved by nitroglycerin.   Past Medical History-  Patient Active Problem List   Diagnosis Date Noted  . CAD S/P DES PCI to proximal LAD 03/22/2015    Priority: High  . Chronic diastolic heart failure ([place]) 08/23/2014    Priority: High  . Paroxysmal atrial fibrillation ([place]); CHA2DSVasc - 4; Now on Eliquis 08/20/2014    Priority: High    Class: Diagnosis of  . CKD (chronic kidney disease) stage 4, GFR 15-29 ml/min (HCC) 08/20/2014    Priority: High  . Personal history of prostate cancer 10/22/2008    Priority: High  . Hyperglycemia 09/27/2017    Priority: Medium  . B12 deficiency 01/06/2017    Priority: Medium  . BPH associated with nocturia 06/15/2016    Priority: Medium  . Moderate aortic stenosis by prior echocardiogram 03/14/2015    Priority: Medium  . Hereditary and idiopathic peripheral neuropathy 01/12/2014    Priority: Medium  . H/O syncope 09/03/2013    Priority: Medium  . Right-sided carotid artery disease; followed by Dr. Ellyn Hack 03/02/2013    Priority: Medium  . Hyperlipidemia with target LDL less than 70 03/02/2013    Priority: Medium  . Migraine without aura 10/26/2012    Priority: Medium  . Anemia 10/23/2008    Priority: Medium  . Essential hypertension 10/22/2008    Priority: Medium  . Sleep apnea 10/22/2008    Priority: Medium  . Fatigue 11/08/2017    Priority: Low  . Chronic diarrhea 01/06/2017    Priority: Low  . Perianal dermatitis  06/19/2015    Priority: Low  . Rectal bleeding 04/25/2015    Priority: Low  . Chest pain with moderate risk for cardiac etiology     Priority: Low  . Abnormal finding on EKG 08/31/2014    Priority: Low  . Dyspnea 08/31/2014    Priority: Low  . Long term current use of anticoagulant therapy 08/27/2014    Priority: Low  . Obesity (BMI 30-39.9) 09/03/2013    Priority: Low  . GLAUCOMA 10/23/2008    Priority: Low  . Arthropathy 10/22/2008    Priority: Low  . History of colonic polyps 10/22/2008    Priority: Low  . Bilateral lower extremity edema 04/15/2017    Medications- reviewed and updated Current Outpatient Medications  Medication Sig Dispense Refill  . clopidogrel (PLAVIX) 75 MG tablet TAKE 1 TABLET EVERY DAY WITH BREAKFAST. 90 tablet 2  . cycloSPORINE (RESTASIS) 0.05 % ophthalmic emulsion Place 1 drop into both eyes daily as needed.     . diltiazem (CARDIZEM CD) 180 MG 24 hr capsule TAKE (1) CAPSULE DAILY. 90 capsule 0  . ELIQUIS 2.5 MG TABS tablet TAKE 1 TABLET BY MOUTH TWICE DAILY. 60 tablet 5  . ferrous sulfate 325 (65 FE) MG tablet Take 325 mg by mouth daily with breakfast.    . furosemide (LASIX) 20 MG tablet TAKE ONE TABLET DAILY-MAY TAKE AN ADDITIONAL EXTRA TABLET DAILY IF NEEDED FOR SWELLING.  45 tablet 0  . hydrocortisone (ANUSOL-HC) 2.5 % rectal cream Place 1 application rectally 2 (two) times daily. Please give pt applicators. Thank you. 30 g 1  . loperamide (IMODIUM A-D) 2 MG tablet Take 2 mg by mouth 4 (four) times daily as needed for diarrhea or loose stools.    . Multiple Vitamin (MULTIVITAMIN PO) Take by mouth.    . nitroGLYCERIN (NITROSTAT) 0.4 MG SL tablet ONE TABLET UNDER TONGUE WHEN NEEDED FOR CHEST PAIN. MAY REPEAT IN 5 MINUTES. 25 tablet 0  . rosuvastatin (CRESTOR) 20 MG tablet TAKE 1 TABLET ONCE DAILY. 90 tablet 3  . topiramate (TOPAMAX) 200 MG tablet TAKE 1 TABLET ONCE DAILY. 90 tablet 0   No current facility-administered medications for this visit.       Objective:  BP 132/74 (BP Location: Left Arm, Patient Position: Sitting, Cuff Size: Normal)   Pulse 64   Temp 98.6 F (37 C) (Oral)   Ht 5\' 8"  (1.727 m)   Wt 182 lb 12.8 oz (82.9 kg)   SpO2 98%   BMI 27.79 kg/m  Gen: NAD, resting comfortably CV: regular rate. 3/6 SEM with known aortic stenosis. no rubs or gallops Lungs: CTAB no crackles, wheeze, rhonchi Abdomen: soft/nontender/nondistended/normal bowel sounds. No rebound or guarding.  Ext: trace edema Skin: warm, dry    Assessment and Plan   #fall a few weeks ago- got tetanus shot after fall. Fall happened on wet stairs- he knows to avoid that now  #upcoming visit with Dr. Trula Slade to consider endarterectomy for stenosed carotid artery  #hypertension/Paroxymal A Fib, Chronic Diastolic Heart Failure S: Blood pressure controlled on Diltiazem 180 mg daily and Furosemide 20 mg daily. BP at home has been dropping over the 4 weeks. It has been running in the 120s/130s- had been around 140 on top previously.   CHF, A. fib, CAD followed by cardiology, Dr. Ellyn Hack. Compliant with eliquis for a fib anticoagulation, diltiazem helps with rate contr  Does not think weight last month was accurate- he states weight within 2 lbs of a year ago. No weight gain or increased edema.  Appears euvolemic Wt Readings from Last 3 Encounters:  03/16/19 182 lb 12.8 oz (82.9 kg)  03/02/19 173 lb (78.5 kg)  09/12/18 188 lb 3.2 oz (85.4 kg)   BP Readings from Last 3 Encounters:  03/16/19 132/74  03/02/19 (!) 144/66  09/12/18 92/62  A/P:  Blood pressure stable. A fib stable. Heart failure stable- continue current meds   #hyperlipidemia/CAD S: lipids previously  controlled on Rosuvastatin 20 mg daily. Tolerating well, some muscle aches.   Has had to use nitroglycerin 3x in last 2 weeks and it is helpful- had not recently been using. Sees Dr. Ellyn Hack in september Lab Results  Component Value Date   CHOL 110 03/16/2019   HDL 41.10 03/16/2019    LDLCALC 54 03/16/2019   LDLDIRECT 52.0 09/12/2018   TRIG 75.0 03/16/2019   CHOLHDL 3 03/16/2019   A/P: hopefully lipids stable- update today.   Glad patient has follow up with Dr. Ellyn Hack next month for CAD- I dont like that hes had to use nitroglycerin more regularly but it is working- if it fails to work or has more frequent symptoms- encouraged sooner follow up  # Hyperglycemia S patient carries increased risk of diabetes based on A1c Lab Results  Component Value Date   HGBA1C 6.0 03/16/2019   HGBA1C 5.9 09/12/2018   HGBA1C 6.1 01/25/2018   A/P: Discussed healthy eating regular exercise- avoiding  metformin with CKD with GFR in 20s or 30s typically   # Migraine S:Taking Topiramate daily.  Seems to be controlling migraines A/P: Stable-continue current medication   # CKD S:Followed by nephrology, Dr. Moshe Cipro - now on yearly plan  A/P: CKD borders between stage III and IV-in stage IV range today.  Continue blood pressure control and avoiding NSAIDs   # Vitamin B12 Deficiency S:Monthly B12 injections.  Received today  Lab Results  Component Value Date   VITAMINB12 >1500 (H) 03/16/2019   A/P: Doing well-continue current medication   # Hemorrhoids-Taking Immodium which helps with loose BMs and helps reduce hemorrhoid risk.   # BPH-on med through urology but didn't help- so stopped.  He wants to remain off medication at present  Recommended follow up: 42-month follow-up advised Future Appointments  Date Time Provider [place]  04/10/2019 10:00 AM [facility]  04/10/2019 11:20 AM Serafina Mitchell, MD [facility]  05/08/2019 10:20 AM [name], MD [facility]  09/12/2019 11:00 AM [name], MD [facility]    Lab/Order associations:   ICD-10-CM   1. B12 deficiency  E53.8 cyanocobalamin ((VITAMIN B-12)) injection 1,000 mcg    Vitamin B12  2. Essential hypertension  I10   3. Paroxysmal atrial fibrillation ([place]);  CHA2DSVasc - 4; Now on Eliquis  I48.0   4. Chronic diastolic heart failure (HCC)  I50.32   5. Hyperlipidemia with target LDL less than 70  E78.5 Comprehensive metabolic panel    Lipid panel  6. CAD S/P DES PCI to proximal LAD  I25.10    Z98.61   7. Anemia, unspecified type  D64.9 CBC    IBC + Ferritin  8. Hyperglycemia  R73.9 Hemoglobin A1c  9. CKD (chronic kidney disease) stage 4, GFR 15-29 ml/min (HCC)  N18.4     Meds ordered this encounter  Medications  . cyanocobalamin ((VITAMIN B-12)) injection 1,000 mcg    Return precautions advised.  [name], MD

## 2019-03-16 NOTE — Patient Instructions (Addendum)
Health Maintenance Due  Topic Date Due  . INFLUENZA VACCINE  03/11/2019  We should have flu shots available by September. Please strongly consider getting flu shot this year. If you get your flu shot at a pharmacy- please let us know.   Please stop by lab before you go If you do not have mychart- we will call you about results within 5 business days of [place] receiving them.  If you have mychart- we will send your results within 3 business days of [place] receiving them.  If abnormal or we want to clarify a result, we will call or mychart you to make sure you receive the message.  If you have questions or concerns or don't hear within 5-7 days, please send [place] a message or call us.

## 2019-03-16 NOTE — Progress Notes (Signed)
Cyanocobalamin 1000 mcg/mL, 1 mL given IM left deltoid, pt tolerated well.

## 2019-03-17 ENCOUNTER — Other Ambulatory Visit: Payer: Self-pay

## 2019-03-17 DIAGNOSIS — R6889 Other general symptoms and signs: Secondary | ICD-10-CM | POA: Diagnosis not present

## 2019-03-17 DIAGNOSIS — Z20822 Contact with and (suspected) exposure to covid-19: Secondary | ICD-10-CM

## 2019-03-19 LAB — NOVEL CORONAVIRUS, NAA: SARS-CoV-2, NAA: NOT DETECTED

## 2019-04-03 ENCOUNTER — Other Ambulatory Visit: Payer: Self-pay | Admitting: Cardiology

## 2019-04-06 ENCOUNTER — Other Ambulatory Visit: Payer: Self-pay

## 2019-04-06 DIAGNOSIS — I6521 Occlusion and stenosis of right carotid artery: Secondary | ICD-10-CM

## 2019-04-10 ENCOUNTER — Ambulatory Visit (INDEPENDENT_AMBULATORY_CARE_PROVIDER_SITE_OTHER): Payer: Medicare Other | Admitting: Surgery

## 2019-04-10 ENCOUNTER — Other Ambulatory Visit: Payer: Self-pay

## 2019-04-10 ENCOUNTER — Ambulatory Visit (HOSPITAL_COMMUNITY)
Admission: RE | Admit: 2019-04-10 | Discharge: 2019-04-10 | Disposition: A | Discharge status: Home / Self Care | Payer: Medicare Other | Source: Ambulatory Visit | Attending: Family | Admitting: Family

## 2019-04-10 ENCOUNTER — Encounter: Payer: Self-pay | Admitting: Surgery

## 2019-04-10 VITALS — BP 161/79 | HR 64 | Temp 97.5°F | Resp 20 | Ht 68.0 in | Wt 184.0 lb

## 2019-04-10 DIAGNOSIS — I6521 Occlusion and stenosis of right carotid artery: Secondary | ICD-10-CM

## 2019-04-10 DIAGNOSIS — I6523 Occlusion and stenosis of bilateral carotid arteries: Secondary | ICD-10-CM
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
## 2019-04-18 ENCOUNTER — Telehealth: Payer: Self-pay | Admitting: *Deleted

## 2019-04-18 NOTE — Telephone Encounter (Signed)
Spoke to patient . Per Dr Ellyn Hack, needs an appointment  -  Patient saw Dr Trula Slade last week with  C/o shortness of breathe Appointment schedule for 04/21/19  At 3:20 pm Patient verbalized understanding.

## 2019-04-20 ENCOUNTER — Other Ambulatory Visit: Payer: Self-pay | Admitting: Family Medicine

## 2019-04-20 DIAGNOSIS — G43009 Migraine without aura, not intractable, without status migrainosus: Secondary | ICD-10-CM

## 2019-04-21 ENCOUNTER — Other Ambulatory Visit: Payer: Self-pay

## 2019-04-21 ENCOUNTER — Ambulatory Visit (INDEPENDENT_AMBULATORY_CARE_PROVIDER_SITE_OTHER): Payer: Medicare Other | Admitting: Cardiology

## 2019-04-21 ENCOUNTER — Encounter: Payer: Self-pay | Admitting: Cardiology

## 2019-04-21 VITALS — BP 130/66 | HR 63 | Temp 97.7°F | Ht 68.0 in | Wt 183.4 lb

## 2019-04-21 DIAGNOSIS — I25119 Atherosclerotic heart disease of native coronary artery with unspecified angina pectoris: Secondary | ICD-10-CM | POA: Diagnosis not present

## 2019-04-21 DIAGNOSIS — I200 Unstable angina: Secondary | ICD-10-CM | POA: Diagnosis not present

## 2019-04-21 DIAGNOSIS — I480 Paroxysmal atrial fibrillation: Secondary | ICD-10-CM | POA: Diagnosis not present

## 2019-04-21 DIAGNOSIS — I350 Nonrheumatic aortic (valve) stenosis: Secondary | ICD-10-CM

## 2019-04-21 DIAGNOSIS — N184 Chronic kidney disease, stage 4 (severe): Secondary | ICD-10-CM

## 2019-04-21 DIAGNOSIS — E785 Hyperlipidemia, unspecified: Secondary | ICD-10-CM

## 2019-04-21 MED ORDER — ISOSORBIDE MONONITRATE ER 60 MG PO TB24: 60.0000 mg | ORAL_TABLET | Freq: Every day | ORAL | 6 refills | Status: DC
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
## 2019-04-21 NOTE — Patient Instructions (Addendum)
Medication Instructions:   -- START TAKING 60 MG  OF IMDUR ( ISOSORBIDE MONO.)   ONE TABLET DAILY   TAKE THE LAST DOSE OF ELIQUIS ON Monday MORNING  04/24/19 -  UNTIL PROCEDURE .   If you need a refill on your cardiac medications before your next appointment, please call your pharmacy.   Lab work: COVID TEST TOMORROW 04/22/19 AT 12:10 PM PLEASE GO TO [address]  ( [place] [address] )  THEN SELF ISOLATE UNTIL ARRIVING TO HOSPITAL ON Tuesday 04/25/19  If you have labs (blood work) drawn today and your tests are completely normal, you will receive your results only by: Marland Kitchen MyChart Message (if you have MyChart) OR . A paper copy in the mail If you have any lab test that is abnormal or we need to change your treatment, we will call you to review the results.  Testing/Procedures:  WILL BE ADMITTED TO [facility] ON 04/25/19  AFTERNOON FOR CARDIAC CATH ON 04/26/19  Follow-Up: At [facility], you and your health needs are our priority.  As part of our continuing mission to provide you with exceptional heart care, we have created designated Provider Care Teams.  These Care Teams include your primary Cardiologist (physician) and Advanced Practice Providers (APPs -  Physician Assistants and Nurse Practitioners) who all work together to provide you with the care you need, when you need it. You will need a follow up appointment in [place]              Any Other Special Instructions Will Be Listed Below (If Applicable).

## 2019-04-22 ENCOUNTER — Other Ambulatory Visit (HOSPITAL_COMMUNITY)
Admission: RE | Admit: 2019-04-22 | Discharge: 2019-04-22 | Disposition: A | Discharge status: Home / Self Care | Payer: Medicare Other | Source: Ambulatory Visit | Attending: Cardiology | Admitting: Cardiology

## 2019-04-22 ENCOUNTER — Encounter: Payer: Self-pay | Admitting: Cardiology

## 2019-04-22 DIAGNOSIS — Z20828 Contact with and (suspected) exposure to other viral communicable diseases: Secondary | ICD-10-CM | POA: Diagnosis not present

## 2019-04-22 DIAGNOSIS — Z01812 Encounter for preprocedural laboratory examination: Secondary | ICD-10-CM | POA: Diagnosis not present

## 2019-04-22 NOTE — Assessment & Plan Note (Signed)
I do not think that his discomfort is related to aortic stenosis.  He has not yet to the level of stenosis that would be concerning.  Continue to monitor.  I think we can probably hold off on checking an echo until 2 years from now.

## 2019-04-22 NOTE — Assessment & Plan Note (Signed)
He has overlapping LAD stents.  Is on a good stable regimen with complete clopidogrel, statin and diltiazem. Over the last 3 weeks has had symptoms of progressive angina which are somewhat concerning and therefore we will proceed with cardiac catheterization.

## 2019-04-22 NOTE — Assessment & Plan Note (Signed)
Remains on diltiazem as he tolerated that better than beta-blocker.  Rate is well controlled.  Has not had recurrence as far as he can tell. He is on Eliquis for anticoagulation with no bleeding issues.  We did take that into consideration with potential need for triple therapy for 1 month I would potentially hold Eliquis while on aspirin plus Plavix.

## 2019-04-22 NOTE — Assessment & Plan Note (Signed)
I am a bit concerned about the progression of symptoms over the last couple weeks.  He seems to be quite concerned with this change.  Nitroglycerin does seem to be helpful but not fully.  I do think this is consistent with progressive angina. He did indicate that he is concerned about his renal function and I think we probably will have to take that into consideration.  Plan: Schedule for overnight admission for hydration per cardiac catheterization and possible PCI on Wednesday, 04/26/2019  Add Imdur 60 mg  Hold Eliquis as of p.m. Monday, 04/24/2019  I did talk to him in detail about my concern with his lesion being almost ostial LAD that there is a possibility that his best option for revascularization is surgical.

## 2019-04-22 NOTE — Assessment & Plan Note (Signed)
Most recent renal function evaluation showed a creatinine of 2.42.  GFR is decreased now.  Unfortunately I think we need to consider cardiac catheterization.  My plan would be to admit him overnight for hydration and then hydrate during the morning.  I will then perform core angiography without left ventriculography.  Would only proceed with PCI if it seems to be something that can be done with minimal contrast.  Would otherwise consider staged intervention.

## 2019-04-22 NOTE — Assessment & Plan Note (Signed)
Remains on stable dose of rosuvastatin.  Most recent lipids show well-controlled LDL levels and total cholesterol.

## 2019-04-23 LAB — NOVEL CORONAVIRUS, NAA (HOSP ORDER, SEND-OUT TO REF LAB; TAT 18-24 HRS): SARS-CoV-2, NAA: NOT DETECTED

## 2019-04-25 ENCOUNTER — Observation Stay (HOSPITAL_COMMUNITY)
Admission: RE | Admit: 2019-04-25 | Discharge: 2019-04-26 | Disposition: A | Discharge status: Home / Self Care | Payer: Medicare Other | Source: Ambulatory Visit | Attending: Cardiology | Admitting: Cardiology

## 2019-04-25 ENCOUNTER — Encounter (HOSPITAL_COMMUNITY): Payer: Self-pay | Admitting: Cardiology

## 2019-04-25 ENCOUNTER — Encounter (HOSPITAL_COMMUNITY): Payer: Self-pay

## 2019-04-25 DIAGNOSIS — Z79899 Other long term (current) drug therapy: Secondary | ICD-10-CM | POA: Diagnosis not present

## 2019-04-25 DIAGNOSIS — Z20828 Contact with and (suspected) exposure to other viral communicable diseases: Secondary | ICD-10-CM | POA: Insufficient documentation

## 2019-04-25 DIAGNOSIS — I25110 Atherosclerotic heart disease of native coronary artery with unstable angina pectoris: Secondary | ICD-10-CM | POA: Diagnosis not present

## 2019-04-25 DIAGNOSIS — I130 Hypertensive heart and chronic kidney disease with heart failure and stage 1 through stage 4 chronic kidney disease, or unspecified chronic kidney disease: Secondary | ICD-10-CM | POA: Insufficient documentation

## 2019-04-25 DIAGNOSIS — Z7901 Long term (current) use of anticoagulants: Secondary | ICD-10-CM | POA: Insufficient documentation

## 2019-04-25 DIAGNOSIS — I2583 Coronary atherosclerosis due to lipid rich plaque: Secondary | ICD-10-CM

## 2019-04-25 DIAGNOSIS — N184 Chronic kidney disease, stage 4 (severe): Secondary | ICD-10-CM | POA: Diagnosis not present

## 2019-04-25 DIAGNOSIS — I480 Paroxysmal atrial fibrillation: Secondary | ICD-10-CM | POA: Diagnosis not present

## 2019-04-25 DIAGNOSIS — I350 Nonrheumatic aortic (valve) stenosis: Secondary | ICD-10-CM | POA: Diagnosis not present

## 2019-04-25 DIAGNOSIS — I6521 Occlusion and stenosis of right carotid artery: Secondary | ICD-10-CM | POA: Insufficient documentation

## 2019-04-25 DIAGNOSIS — Z85828 Personal history of other malignant neoplasm of skin: Secondary | ICD-10-CM | POA: Insufficient documentation

## 2019-04-25 DIAGNOSIS — Z87891 Personal history of nicotine dependence: Secondary | ICD-10-CM | POA: Insufficient documentation

## 2019-04-25 DIAGNOSIS — I5032 Chronic diastolic (congestive) heart failure: Secondary | ICD-10-CM | POA: Diagnosis not present

## 2019-04-25 DIAGNOSIS — E669 Obesity, unspecified: Secondary | ICD-10-CM | POA: Diagnosis not present

## 2019-04-25 DIAGNOSIS — I5189 Other ill-defined heart diseases: Secondary | ICD-10-CM | POA: Diagnosis present

## 2019-04-25 DIAGNOSIS — E785 Hyperlipidemia, unspecified: Secondary | ICD-10-CM | POA: Diagnosis not present

## 2019-04-25 DIAGNOSIS — I200 Unstable angina: Secondary | ICD-10-CM | POA: Diagnosis present

## 2019-04-25 DIAGNOSIS — M1990 Unspecified osteoarthritis, unspecified site: Secondary | ICD-10-CM | POA: Diagnosis not present

## 2019-04-25 DIAGNOSIS — E7800 Pure hypercholesterolemia, unspecified: Secondary | ICD-10-CM

## 2019-04-25 DIAGNOSIS — Z6827 Body mass index (BMI) 27.0-27.9, adult: Secondary | ICD-10-CM | POA: Diagnosis not present

## 2019-04-25 DIAGNOSIS — I2510 Atherosclerotic heart disease of native coronary artery without angina pectoris: Secondary | ICD-10-CM

## 2019-04-25 DIAGNOSIS — Z8546 Personal history of malignant neoplasm of prostate: Secondary | ICD-10-CM | POA: Diagnosis not present

## 2019-04-25 DIAGNOSIS — I10 Essential (primary) hypertension: Secondary | ICD-10-CM

## 2019-04-25 DIAGNOSIS — I25118 Atherosclerotic heart disease of native coronary artery with other forms of angina pectoris: Secondary | ICD-10-CM | POA: Diagnosis present

## 2019-04-25 DIAGNOSIS — I25119 Atherosclerotic heart disease of native coronary artery with unspecified angina pectoris: Secondary | ICD-10-CM | POA: Diagnosis present

## 2019-04-25 LAB — BASIC METABOLIC PANEL
Anion gap: 8 (ref 5–15)
BUN: 47 mg/dL — ABNORMAL HIGH (ref 8–23)
CO2: 22 mmol/L (ref 22–32)
Calcium: 8.5 mg/dL — ABNORMAL LOW (ref 8.9–10.3)
Chloride: 110 mmol/L (ref 98–111)
Creatinine, Ser: 2.45 mg/dL — ABNORMAL HIGH (ref 0.61–1.24)
GFR calc Af Amer: 27 mL/min — ABNORMAL LOW (ref >60)
GFR calc non Af Amer: 23 mL/min — ABNORMAL LOW (ref >60)
Glucose, Bld: 118 mg/dL — ABNORMAL HIGH (ref 70–99)
Potassium: 4.7 mmol/L (ref 3.5–5.1)
Sodium: 140 mmol/L (ref 135–145)

## 2019-04-25 LAB — CBC
HCT: 34.3 % — ABNORMAL LOW (ref 39.0–52.0)
Hemoglobin: 11.4 g/dL — ABNORMAL LOW (ref 13.0–17.0)
MCH: 34.2 pg — ABNORMAL HIGH (ref 26.0–34.0)
MCHC: 33.2 g/dL (ref 30.0–36.0)
MCV: 103 fL — ABNORMAL HIGH (ref 80.0–100.0)
Platelets: 223 10*3/uL (ref 150–400)
RBC: 3.33 MIL/uL — ABNORMAL LOW (ref 4.22–5.81)
RDW: 12.6 % (ref 11.5–15.5)
WBC: 12.2 10*3/uL — ABNORMAL HIGH (ref 4.0–10.5)
nRBC: 0 % (ref 0.0–0.2)

## 2019-04-25 MED ORDER — ISOSORBIDE MONONITRATE ER 60 MG PO TB24
60.0000 mg | ORAL_TABLET | Freq: Every day | ORAL | Status: DC
  Administered 2019-04-26: 60 mg via ORAL
  Filled 2019-04-25: qty 1

## 2019-04-25 MED ORDER — HEPARIN SODIUM (PORCINE) 5000 UNIT/ML IJ SOLN
5000.0000 [IU] | Freq: Three times a day (TID) | INTRAMUSCULAR | Status: DC
  Administered 2019-04-25 – 2019-04-26 (×2): 5000 [IU] via SUBCUTANEOUS
  Filled 2019-04-25 (×2): qty 1

## 2019-04-25 MED ORDER — SODIUM CHLORIDE 0.9% FLUSH
3.0000 mL | Freq: Two times a day (BID) | INTRAVENOUS | Status: DC
  Administered 2019-04-25: 3 mL via INTRAVENOUS

## 2019-04-25 MED ORDER — SODIUM CHLORIDE 0.9 % WEIGHT BASED INFUSION
1.0000 mL/kg/h | INTRAVENOUS | Status: DC
  Administered 2019-04-26: 1 mL/kg/h via INTRAVENOUS

## 2019-04-25 MED ORDER — DILTIAZEM HCL ER COATED BEADS 180 MG PO CP24
180.0000 mg | ORAL_CAPSULE | Freq: Every day | ORAL | Status: DC
  Administered 2019-04-26: 180 mg via ORAL
  Filled 2019-04-25: qty 1

## 2019-04-25 MED ORDER — ROSUVASTATIN CALCIUM 20 MG PO TABS
20.0000 mg | ORAL_TABLET | Freq: Every day | ORAL | Status: DC
  Administered 2019-04-25: 20 mg via ORAL
  Filled 2019-04-25: qty 1

## 2019-04-25 MED ORDER — SALINE SPRAY 0.65 % NA SOLN
1.0000 | NASAL | Status: DC | PRN
  Filled 2019-04-25: qty 44

## 2019-04-25 MED ORDER — ACETAMINOPHEN 500 MG PO TABS: 500.0000 mg | ORAL_TABLET | Freq: Four times a day (QID) | ORAL | Status: DC | PRN

## 2019-04-25 MED ORDER — NITROGLYCERIN 0.4 MG SL SUBL: 0.4000 mg | SUBLINGUAL_TABLET | SUBLINGUAL | Status: DC | PRN

## 2019-04-25 MED ORDER — CLOPIDOGREL BISULFATE 75 MG PO TABS
75.0000 mg | ORAL_TABLET | Freq: Every day | ORAL | Status: DC
  Administered 2019-04-26: 75 mg via ORAL
  Filled 2019-04-25: qty 1

## 2019-04-25 MED ORDER — FERROUS SULFATE 325 (65 FE) MG PO TABS
325.0000 mg | ORAL_TABLET | Freq: Every day | ORAL | Status: DC
  Administered 2019-04-26: 325 mg via ORAL
  Filled 2019-04-25: qty 1

## 2019-04-25 MED ORDER — ASPIRIN 81 MG PO CHEW
81.0000 mg | CHEWABLE_TABLET | ORAL | Status: AC
  Administered 2019-04-26: 81 mg via ORAL
  Filled 2019-04-25: qty 1

## 2019-04-25 MED ORDER — SODIUM CHLORIDE 0.9 % IV SOLN: 250.0000 mL | INTRAVENOUS | Status: DC | PRN

## 2019-04-25 MED ORDER — SODIUM CHLORIDE 0.9% FLUSH: 3.0000 mL | INTRAVENOUS | Status: DC | PRN

## 2019-04-25 MED ORDER — TOPIRAMATE 25 MG PO TABS
100.0000 mg | ORAL_TABLET | Freq: Every day | ORAL | Status: DC
  Administered 2019-04-25: 100 mg via ORAL
  Filled 2019-04-25: qty 4

## 2019-04-25 NOTE — H&P (Signed)
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 Cardiovascular;;pLAD Synergy DES 2.75 mmx 16 mm -- 3.3 mm   CARDIAC CATHETERIZATION N/A 06/09/2016   Procedure: LEFT HEART CATHETERIZATION WITH CORNARY ANGIOGRAPHY.  Surgeon: [name], MD;  Location: [place] [facility];  Service: Cardiovascular.  Essentially stable coronaries, but to 85% lesion proximal to prior LAD stent with 40% proximal stent ISR. FFR was significantly positive.   CARDIAC CATHETERIZATION N/A 06/09/2016   Procedure: Coronary Stent Intervention;  Surgeon: [name], MD;  Location: [place] [facility];  Service: Cardiovascular: FFR Guided PCI of pLAD ~80% pre-stent & 40% ISR --> Synergy DES 3.0  x12  (3.6 mm extends to~ LM)   CARDIOVERSION N/A 08/22/2014   Procedure: CARDIOVERSION;  Surgeon: Josue Hector, MD;  Location: [facility]  ENDOSCOPY;  Service: Cardiovascular;  Laterality: N/A;   CAROTID DOPPLER  10/21/2012   Continues to have 60 to 79% right carotid.  Left carotid < 40%.  Normal vertebral and subclavian arteries bilaterally.  (Stable.  Follow-up 1 year.)   CATARACT EXTRACTION W/ INTRAOCULAR LENS  IMPLANT, BILATERAL Bilateral    COLONOSCOPY     INSERTION PROSTATE RADIATION SEED  04/2007   KNEE ARTHROSCOPY Bilateral    NM MYOVIEW LTD  05/2018   a) 08/2014: 60%. Fixed inferior defect likely diaphragmatic attenuation. LOW RISK. ;; b) 05/2018 Lexiscan - EF 55-60%. LOW RISK. No ischemia or infarction.   TEE WITHOUT CARDIOVERSION N/A 08/22/2014   Procedure: TRANSESOPHAGEAL ECHOCARDIOGRAM (TEE);  Surgeon: Josue Hector, MD;  Location: [place];  Service: Cardiovascular;  Laterality: N/A;   TONSILLECTOMY AND ADENOIDECTOMY     TOTAL KNEE ARTHROPLASTY Right 05/2009   TRANSTHORACIC ECHOCARDIOGRAM  10/2018    EF 60 to 65%.  Mild to moderate MR.  Moderate aortic calcification.  Mild to moderate aortic stenosis.  Mean gradient 22 mmHg   TRANSTHORACIC ECHOCARDIOGRAM  12/08/2017    Normal EF 55-60%.  GR 1 DD.  Moderate aortic stenosis (mean-P gradient 20 mmHg - 35 mmHg.).    Cath/PCI 05/2016 Diagnostic Diagram Post-Intervention Diagram  Active Medications      Current Meds  Medication Sig   clopidogrel (PLAVIX) 75 MG tablet TAKE 1 TABLET EVERY DAY WITH BREAKFAST.   cycloSPORINE (RESTASIS) 0.05 % ophthalmic emulsion Place 1 drop into both eyes daily as needed.    diltiazem (CARDIZEM CD) 180 MG 24 hr capsule TAKE (1) CAPSULE DAILY.   ELIQUIS 2.5 MG TABS tablet TAKE 1 TABLET BY MOUTH TWICE DAILY.   ferrous sulfate 325 (65 FE) MG tablet Take 325 mg by mouth daily with breakfast.   furosemide (LASIX) 20 MG tablet TAKE ONE TABLET DAILY-MAY TAKE AN  ADDITIONAL EXTRA TABLET DAILY IF NEEDED FOR SWELLING.   hydrocortisone (ANUSOL-HC) 2.5 % rectal cream Place 1 application rectally 2 (two) times daily. Please give pt applicators. Thank you.   loperamide (IMODIUM A-D) 2 MG tablet Take 2 mg by mouth 4 (four) times daily as needed for diarrhea or loose stools.   Multiple Vitamin (MULTIVITAMIN PO) Take by mouth.   nitroGLYCERIN (NITROSTAT) 0.4 MG SL tablet ONE TABLET UNDER TONGUE WHEN NEEDED FOR CHEST PAIN. MAY REPEAT IN 5 MINUTES.   rosuvastatin (CRESTOR) 20 MG tablet TAKE 1 TABLET ONCE DAILY.   topiramate (TOPAMAX) 200 MG tablet TAKE 1 TABLET ONCE DAILY.      No Known Allergies  Social History        Tobacco Use   Smoking status: Former Smoker    Years: 15.00    Types: Pipe, Cigars    Quit date: 08/10/1968    Years since quitting: 50.7   Smokeless tobacco: Never Used  Substance Use Topics   Alcohol use: No    Comment: 06/09/2016; 03/13/2015 "I have drank in my life; not more than a gallon in my lifetime; don't drink anymore"   Drug use: No   Social History      Social History Narrative   married with 4 children, and 11 grandchildren with 2 great-grandchildren.       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 Son took over.    Still working-5 days a week working and 6 hours    family history includes Other in his brother and brother; Ovarian cancer in his mother; Suicidality in his father; Testicular cancer in his son.     Wt Readings from Last 3 Encounters:  04/21/19 183 lb 6.4 oz (83.2 kg)  04/10/19 184 lb (83.5 kg)  03/16/19 182 lb 12.8 oz (82.9 kg)    PHYSICAL EXAM BP 130/66    Pulse 63    Temp 97.7 F (36.5 C)    Ht 5\' 8"  (1.727 m)    Wt 183 lb 6.4 oz (83.2 kg)    SpO2 99%    BMI 27.89 kg/m  Physical Exam  Constitutional: He appears well-developed and well-nourished.     Adult ECG Report  Rate: 63 ;  Rhythm:  normal sinus rhythm and Nonspecific ST and T wave changes.  Otherwise normal axis, intervals and durations;   Narrative Interpretation: Stable EKG  Other studies Reviewed: Additional studies/ records that were reviewed today include:  Recent Labs:   Recent Labs       Lab Results  Component Value Date   CREATININE 2.42 (H) 03/16/2019   BUN 50 (H) 03/16/2019   NA 141 03/16/2019   K 5.1 03/16/2019   CL 110 03/16/2019   CO2 23 03/16/2019     Recent Labs  Lab Results  Component Value Date   CHOL 110 03/16/2019   HDL 41.10 03/16/2019   LDLCALC 54 03/16/2019   LDLDIRECT 52.0 09/12/2018   TRIG 75.0 03/16/2019   CHOLHDL 3 03/16/2019     CBC Latest Ref Rng & Units 03/16/2019 09/12/2018 05/10/2018  WBC 4.0 - 10.5 K/uL 9.3 9.6 9.4  Hemoglobin 13.0 - 17.0 g/dL 11.4(L) 10.9(L) 11.2(L)  Hematocrit 39.0 - 52.0 % 33.8(L) 32.6(L) 32.8(L)  Platelets 150.0 - 400.0 K/uL 226.0 227.0 236.0    ASSESSMENT / PLAN:     Problem List Items Addressed This Visit    CAD S/P DES PCI to proximal LAD (Chronic)     He has overlapping LAD stents.  Is on a good stable regimen with complete clopidogrel, statin and diltiazem. Over the last 3 weeks has had symptoms of progressive angina which are somewhat concerning and therefore we will proceed with cardiac catheterization.       Relevant Medications    isosorbide mononitrate (IMDUR) 60 MG 24 hr tablet    Chronic kidney disease (CKD), active medical management without dialysis, stage 4 (severe) (HCC) (Chronic)     Most recent renal function evaluation showed a creatinine of 2.42.  GFR is decreased now.  Unfortunately I think we need to consider cardiac catheterization.  My plan would be to admit him overnight for hydration and then hydrate during the morning.  I will then perform core angiography without left ventriculography.  Would only proceed with PCI if it seems to be something that can be done with minimal contrast.  Would  otherwise consider staged intervention.       Hyperlipidemia with target LDL less than 70 (Chronic)     Remains on stable dose of rosuvastatin.  Most recent lipids show well-controlled LDL levels and total cholesterol.       Relevant Medications    isosorbide mononitrate (IMDUR) 60 MG 24 hr tablet    Moderate aortic stenosis by prior echocardiogram (Chronic)     I do not think that his discomfort is related to aortic stenosis.  He has not yet to the level of stenosis that would be concerning.  Continue to monitor.  I think we can probably hold off on checking an echo until 2 years from now.       Relevant Medications    isosorbide mononitrate (IMDUR) 60 MG 24 hr tablet    Paroxysmal atrial fibrillation ([place]); CHA2DSVasc - 4; Now on Eliquis (Chronic)     Remains on diltiazem as he tolerated that better than beta-blocker.  Rate is well controlled.  Has not had recurrence as far as he can tell. He is on Eliquis for anticoagulation with no bleeding issues.  We did take that into consideration with potential need for triple therapy for 1 month I would potentially hold Eliquis while on aspirin plus Plavix.       Relevant Medications    isosorbide mononitrate (IMDUR) 60 MG 24 hr tablet    Progressive angina (HCC) - Primary     I am a bit concerned about the progression of symptoms over the last couple weeks.  He seems to be quite concerned with this change.  Nitroglycerin does seem to be helpful but not fully.  I do think this is consistent with progressive angina. He did indicate that he is concerned about his renal function and I think we probably will have to take that into consideration.  Plan: Schedule for overnight admission for hydration per cardiac catheterization and  possible PCI on Wednesday, 04/26/2019  Add Imdur 60 mg  Hold Eliquis as of p.m. Monday, 04/24/2019  I did talk to him in detail about my concern with his lesion being almost ostial LAD that there is  a possibility that his best option for revascularization is surgical.       Relevant Medications    isosorbide mononitrate (IMDUR) 60 MG 24 hr tablet    Other Relevant Orders    EKG 12-Lead       I spent a total of 45 minutes with the patient and chart review. >  50% of the time was spent in direct patient consultation.   Current medicines are reviewed at length with the patient today. (+/- concerns) none The following changes have been made: Add Imdur 60 mg daily -Eliquis will be held for preop cath   ADDENDUM: Patient evaluated by Dr. Ellyn Hack on 9/11 and complained of increasing DOE for the past [redacted] weeks along with chest tightness.  This would occur when he would go out for a walk and would have to stop to catch his breath.  He has been taking several NTG daily and sx improve.  Dr. Ellyn Hack planned for patient to come in for admission for overnight hydration and then plan cath on 9/16.  He is here today.  Currently pain free but has continued to have intermittent CP and DOE since seeing Dr. Ellyn Hack and again nitrate responsive.    GEN: Well nourished, well developed in no acute distress HEENT: Normal NECK: No JVD; No carotid bruits LYMPHATICS: No lymphadenopathy CARDIAC:RRR, no murmurs, rubs, gallops RESPIRATORY:  Clear to auscultation without rales, wheezing or rhonchi  ABDOMEN: Soft, non-tender, non-distended MUSCULOSKELETAL:  No edema; No deformity  SKIN: Warm and dry NEUROLOGIC:  Alert and oriented x 3 PSYCHIATRIC:  Normal affect   Will admit to tele bed.  Eliquis on hold for cath in am. Unfortunately he forgot and took his dose of Eliquis last night. Check BMET, CBC today.  Creatinine was 2.42 on 8/6.  Start IV hydration with tonight.  Repeat BMET in am.  Plan for cath with limited contrast and no LV gram late afternoon tomorrow due to taking Eliquis last night.  Continue current meds except Lasix.

## 2019-04-26 ENCOUNTER — Other Ambulatory Visit: Payer: Self-pay | Admitting: Cardiology

## 2019-04-26 ENCOUNTER — Other Ambulatory Visit: Payer: Self-pay

## 2019-04-26 ENCOUNTER — Encounter (HOSPITAL_COMMUNITY)
Admission: RE | Disposition: A | Discharge status: Home / Self Care | Payer: Medicare Other | Source: Ambulatory Visit | Attending: Cardiology

## 2019-04-26 ENCOUNTER — Ambulatory Visit (HOSPITAL_COMMUNITY): Admission: RE | Admit: 2019-04-26 | Payer: Medicare Other | Source: Home / Self Care | Admitting: Cardiology

## 2019-04-26 DIAGNOSIS — I350 Nonrheumatic aortic (valve) stenosis: Secondary | ICD-10-CM | POA: Diagnosis not present

## 2019-04-26 DIAGNOSIS — I25110 Atherosclerotic heart disease of native coronary artery with unstable angina pectoris: Secondary | ICD-10-CM | POA: Diagnosis not present

## 2019-04-26 DIAGNOSIS — I5032 Chronic diastolic (congestive) heart failure: Secondary | ICD-10-CM | POA: Diagnosis not present

## 2019-04-26 DIAGNOSIS — E669 Obesity, unspecified: Secondary | ICD-10-CM | POA: Diagnosis not present

## 2019-04-26 DIAGNOSIS — I6521 Occlusion and stenosis of right carotid artery: Secondary | ICD-10-CM | POA: Diagnosis not present

## 2019-04-26 DIAGNOSIS — I25119 Atherosclerotic heart disease of native coronary artery with unspecified angina pectoris: Secondary | ICD-10-CM

## 2019-04-26 DIAGNOSIS — N184 Chronic kidney disease, stage 4 (severe): Secondary | ICD-10-CM | POA: Diagnosis not present

## 2019-04-26 DIAGNOSIS — Z7901 Long term (current) use of anticoagulants: Secondary | ICD-10-CM | POA: Diagnosis not present

## 2019-04-26 DIAGNOSIS — I480 Paroxysmal atrial fibrillation: Secondary | ICD-10-CM | POA: Diagnosis not present

## 2019-04-26 DIAGNOSIS — E785 Hyperlipidemia, unspecified: Secondary | ICD-10-CM | POA: Diagnosis not present

## 2019-04-26 DIAGNOSIS — I200 Unstable angina: Secondary | ICD-10-CM | POA: Diagnosis not present

## 2019-04-26 DIAGNOSIS — Z20828 Contact with and (suspected) exposure to other viral communicable diseases: Secondary | ICD-10-CM | POA: Diagnosis not present

## 2019-04-26 DIAGNOSIS — I130 Hypertensive heart and chronic kidney disease with heart failure and stage 1 through stage 4 chronic kidney disease, or unspecified chronic kidney disease: Secondary | ICD-10-CM | POA: Diagnosis not present

## 2019-04-26 DIAGNOSIS — M1990 Unspecified osteoarthritis, unspecified site: Secondary | ICD-10-CM | POA: Diagnosis not present

## 2019-04-26 HISTORY — PX: LEFT HEART CATH AND CORONARY ANGIOGRAPHY: CATH118249

## 2019-04-26 LAB — BASIC METABOLIC PANEL
Anion gap: 6 (ref 5–15)
BUN: 42 mg/dL — ABNORMAL HIGH (ref 8–23)
CO2: 22 mmol/L (ref 22–32)
Calcium: 8.2 mg/dL — ABNORMAL LOW (ref 8.9–10.3)
Chloride: 112 mmol/L — ABNORMAL HIGH (ref 98–111)
Creatinine, Ser: 2.03 mg/dL — ABNORMAL HIGH (ref 0.61–1.24)
GFR calc Af Amer: 33 mL/min — ABNORMAL LOW (ref >60)
GFR calc non Af Amer: 29 mL/min — ABNORMAL LOW (ref >60)
Glucose, Bld: 102 mg/dL — ABNORMAL HIGH (ref 70–99)
Potassium: 4.4 mmol/L (ref 3.5–5.1)
Sodium: 140 mmol/L (ref 135–145)

## 2019-04-26 LAB — POCT I-STAT, CHEM 8
BUN: 37 mg/dL — ABNORMAL HIGH (ref 8–23)
Calcium, Ion: 1.23 mmol/L (ref 1.15–1.40)
Chloride: 111 mmol/L (ref 98–111)
Creatinine, Ser: 2.1 mg/dL — ABNORMAL HIGH (ref 0.61–1.24)
Glucose, Bld: 96 mg/dL (ref 70–99)
HCT: 29 % — ABNORMAL LOW (ref 39.0–52.0)
Hemoglobin: 9.9 g/dL — ABNORMAL LOW (ref 13.0–17.0)
Potassium: 4.6 mmol/L (ref 3.5–5.1)
Sodium: 142 mmol/L (ref 135–145)
TCO2: 21 mmol/L — ABNORMAL LOW (ref 22–32)

## 2019-04-26 LAB — SARS CORONAVIRUS 2 (TAT 6-24 HRS): SARS Coronavirus 2: NEGATIVE

## 2019-04-26 SURGERY — LEFT HEART CATH AND CORONARY ANGIOGRAPHY
Anesthesia: LOCAL

## 2019-04-26 MED ORDER — HEPARIN SODIUM (PORCINE) 1000 UNIT/ML IJ SOLN
INTRAMUSCULAR | Status: DC | PRN
  Administered 2019-04-26: 4000 [IU] via INTRAVENOUS

## 2019-04-26 MED ORDER — LIDOCAINE HCL (PF) 1 % IJ SOLN
INTRAMUSCULAR | Status: DC | PRN
  Administered 2019-04-26: 2 mL

## 2019-04-26 MED ORDER — MIDAZOLAM HCL 2 MG/2ML IJ SOLN
INTRAMUSCULAR | Status: AC
  Filled 2019-04-26: qty 2

## 2019-04-26 MED ORDER — LABETALOL HCL 5 MG/ML IV SOLN: 10.0000 mg | INTRAVENOUS | Status: AC | PRN

## 2019-04-26 MED ORDER — SODIUM CHLORIDE 0.9% FLUSH: 3.0000 mL | Freq: Two times a day (BID) | INTRAVENOUS | Status: DC

## 2019-04-26 MED ORDER — VERAPAMIL HCL 2.5 MG/ML IV SOLN
INTRAVENOUS | Status: DC | PRN
  Administered 2019-04-26: 14:00:00 10 mL via INTRA_ARTERIAL

## 2019-04-26 MED ORDER — ONDANSETRON HCL 4 MG/2ML IJ SOLN: 4.0000 mg | Freq: Four times a day (QID) | INTRAMUSCULAR | Status: DC | PRN

## 2019-04-26 MED ORDER — FENTANYL CITRATE (PF) 100 MCG/2ML IJ SOLN
INTRAMUSCULAR | Status: DC | PRN
  Administered 2019-04-26: 25 ug via INTRAVENOUS

## 2019-04-26 MED ORDER — IOHEXOL 350 MG/ML SOLN
INTRAVENOUS | Status: DC | PRN
  Administered 2019-04-26: 45 mL

## 2019-04-26 MED ORDER — APIXABAN 2.5 MG PO TABS: 2.5000 mg | ORAL_TABLET | Freq: Two times a day (BID) | ORAL | Status: DC

## 2019-04-26 MED ORDER — LIDOCAINE HCL (PF) 1 % IJ SOLN
INTRAMUSCULAR | Status: AC
  Filled 2019-04-26: qty 30

## 2019-04-26 MED ORDER — VERAPAMIL HCL 2.5 MG/ML IV SOLN
INTRAVENOUS | Status: AC
  Filled 2019-04-26: qty 2

## 2019-04-26 MED ORDER — HYDRALAZINE HCL 20 MG/ML IJ SOLN: 10.0000 mg | INTRAMUSCULAR | Status: AC | PRN

## 2019-04-26 MED ORDER — HEPARIN (PORCINE) IN NACL 1000-0.9 UT/500ML-% IV SOLN
INTRAVENOUS | Status: AC
  Filled 2019-04-26: qty 1000

## 2019-04-26 MED ORDER — SODIUM CHLORIDE 0.9 % IV SOLN: 250.0000 mL | INTRAVENOUS | Status: DC | PRN

## 2019-04-26 MED ORDER — HEPARIN (PORCINE) IN NACL 1000-0.9 UT/500ML-% IV SOLN
INTRAVENOUS | Status: DC | PRN
  Administered 2019-04-26 (×4): 500 mL

## 2019-04-26 MED ORDER — FENTANYL CITRATE (PF) 100 MCG/2ML IJ SOLN
INTRAMUSCULAR | Status: AC
  Filled 2019-04-26: qty 2

## 2019-04-26 MED ORDER — SODIUM CHLORIDE 0.9% FLUSH: 3.0000 mL | INTRAVENOUS | Status: DC | PRN

## 2019-04-26 MED ORDER — SODIUM CHLORIDE 0.9 % IV SOLN
INTRAVENOUS | Status: AC
  Administered 2019-04-26: 15:00:00 via INTRAVENOUS

## 2019-04-26 MED ORDER — MIDAZOLAM HCL 2 MG/2ML IJ SOLN
INTRAMUSCULAR | Status: DC | PRN
  Administered 2019-04-26: 1 mg via INTRAVENOUS

## 2019-04-26 SURGICAL SUPPLY — 11 items
CATH OPTITORQUE TIG 4.0 5F (CATHETERS) ×1 IMPLANT
DEVICE RAD COMP TR BAND LRG (VASCULAR PRODUCTS) ×1 IMPLANT
GLIDESHEATH SLEND A-KIT 6F 22G (SHEATH) ×1 IMPLANT
GLIDESHEATH SLEND SS 6F .021 (SHEATH) ×1 IMPLANT
GUIDEWIRE INQWIRE 1.5J.035X260 (WIRE) ×1 IMPLANT
INQWIRE 1.5J .035X260CM (WIRE) ×2
KIT HEART LEFT (KITS) ×2 IMPLANT
PACK CARDIAC CATHETERIZATION (CUSTOM PROCEDURE TRAY) ×2 IMPLANT
SHEATH PROBE COVER 6X72 (BAG) ×1 IMPLANT
TRANSDUCER W/STOPCOCK (MISCELLANEOUS) ×2 IMPLANT
TUBING CIL FLEX 10 FLL-RA (TUBING) ×2 IMPLANT

## 2019-04-26 NOTE — Discharge Summary (Signed)
Discharge Summary    Patient ID: Mark Calixte.,  MRN: [id number], DOB/AGE: January 16, 1930 85 y.o.  Admit date: 04/25/2019 Discharge date: 04/26/2019  Primary Care Provider: [name] Primary Cardiologist: [name], MD   Discharge Diagnoses    Principal Problem:   Progressive angina [facility]) Active Problems:   Chronic kidney disease (CKD), active medical management without dialysis, stage 4 (severe) ([place])   Chronic diastolic heart failure (HCC)   Moderate aortic stenosis by prior echocardiogram   CAD S/P DES PCI to proximal LAD   Allergies No Known Allergies  Diagnostic Studies/Procedures    Cath: 04/26/19   There is MODERATE-SEVEREAORTIC VALVE STENOSIS.  LV end diastolic pressure is normal.  Previously placed Ost-Prox Overlappiing LAD drug eluting stents are widely patent.  Otherwise angiographically normal coronary arteries.   SUMMARY  Single-vessel CAD with widely patent overlapping ostial-proximal LAD stents.  Left dominant system  Moderate to severe aortic stenosis with P-P gradient of 36 mmHg and mean gradient of 31 mmHg  Normal LVEDP  RECOMMENDATIONS  We will run 4 hours of IV fluids post procedure but would be okay to discharge tonight.  Continue Imdur as directed  Restart Eliquis tonight at 2200  We will order outpatient echocardiogram to be done prior to his follow-up visit to reevaluate aortic valve for progression of aortic stenosis.  [name], MD  Diagnostic Dominance: Left   _____________   History of Present Illness     Mark Dues. is a 85 y.o. male with a PMH notable for CAD-PCI to proxLAD (over- lapping stents) and PAF as well as moderate aortic stenosis who presented to the office on 9/11 for evaluation exertional dyspnea and chest discomfort.   Mark Jackson presented to the office with his daughter (which is usually indicative of something actually concerning to him going on).  Stated he was doing fine up  until about 3 weeks ago when he started noticing more progressive shortness of breath with exertion and some chest tightness.  He did not tell anybody about it, but he became concerned when he went out for a walk, and decided to stop halfway home having to sit down to catch his breath.  He did not think he was able to make it home.  He had taken nitroglycerin which did relieve some of the chest discomfort and dyspnea, but he actually stayed seated and was found by family member.  Since then which was probably be August 24-26 timeframe, he took at least 2 if not 3 nitroglycerin a day for the next 3 days, then things settled down because he started taking it easy. Three days prior to his office visit when he has been again having to take nitroglycerin for chest discomfort and dyspnea with even minimal exertion now.  He was scared to go for walks.  States that most the time the chest discomfort and dyspnea occurred in the afternoon when he is little bit worn out from being busy all day long.  Not really troublesome to him in the morning when he first wakes up or when he goes to bed at night indicating no angina decubitus.  Usually was associated with activity and is associated with shortness of breath first and the chest tightness. Given his symptoms he was set up for an outpatient cardiac cath.    Hospital Course     He was admitted to IV hydration the evening prior to cath. Elquis was held prior to admission. Cr improved  from 2.45>>2.03. Underwent cardiac cath noted above with single vessel CAD but patent overlapping stents in the pLAD. Normal LVEDP. Concern for worsening aortic stenosis with P-P gradient of 36 mmHg and mean gradient of 31 mmHg. He was hydrated for 4 hours post cath. Will arrange for outpatient echo prior to follow up appt. Instructed to resume Eliquis this evening at 10pm. Radial cath site without any complications.   Mark Jackson. was seen by Dr. Ellyn Hack and determined stable for  discharge home. Follow up in the office has been arranged. Medications are listed below.   _____________  Discharge Vitals Blood pressure 134/61, pulse (!) 58, temperature 98.4 F (36.9 C), temperature source Oral, resp. rate (!) 21, height 5\' 8"  (1.727 m), weight 82 kg, SpO2 99 %.  Filed Weights   04/25/19 1739 04/26/19 0000  Weight: 81.1 kg 82 kg    Labs & Radiologic Studies    CBC Recent Labs    04/25/19 2037 04/26/19 1355  WBC 12.2*  --   HGB 11.4* 9.9*  HCT 34.3* 29.0*  MCV 103.0*  --   PLT 223  --    Basic Metabolic Panel Recent Labs    04/25/19 2037 04/26/19 0505 04/26/19 1355  NA 140 140 142  K 4.7 4.4 4.6  CL 110 112* 111  CO2 22 22  --   GLUCOSE 118* 102* 96  BUN 47* 42* 37*  CREATININE 2.45* 2.03* 2.10*  CALCIUM 8.5* 8.2*  --   _____________  [facility] US Carotid  Result Date: 04/10/2019 Carotid Arterial Duplex Study Indications:                       Catoid stenosis NL study- right only. Comparison Study:                  [facility] study performed 01/11/2019 60-79%                                    right ICA stenosis Pre-Surgical Evaluation & Surgical Stenosis at right bifurcation/proximal-mid Correlation                        ICA and proximal ECA. ICA is normal past the                                    stenosis. Anatomy on the right is within                                    normal limits.Right bifurcation is located                                    near the mandible. Performing Technologist: Kathrine Comfort RVT, RDCS  Examination Guidelines: A complete evaluation includes B-mode imaging, spectral Doppler, color Doppler, and power Doppler as needed of all accessible portions of each vessel. Bilateral testing is considered an integral part of a complete examination. Limited examinations for reoccurring indications may be performed as noted.  Right Carotid Findings: +----------+--------+--------+--------+--------------------------+--------+             PSV  cm/s EDV cm/s Stenosis Plaque Description  Comments  +----------+--------+--------+--------+--------------------------+--------+  CCA Prox   55       13                                                     +----------+--------+--------+--------+--------------------------+--------+  CCA Mid    44       10                                                     +----------+--------+--------+--------+--------------------------+--------+  CCA Distal 67       15                                                     +----------+--------+--------+--------+--------------------------+--------+  ICA Prox   287      72       60-79%   irregular and heterogenous           +----------+--------+--------+--------+--------------------------+--------+  ICA Mid    163      49                                                     +----------+--------+--------+--------+--------------------------+--------+  ICA Distal 76       24                                                     +----------+--------+--------+--------+--------------------------+--------+  ECA        150      25                                                     +----------+--------+--------+--------+--------------------------+--------+ +----------+--------+-------+----------------+-------------------+             PSV cm/s EDV cms Describe         Arm Pressure (mmHG)  +----------+--------+-------+----------------+-------------------+  Subclavian 116              Multiphasic, WNL                      +----------+--------+-------+----------------+-------------------+ +---------+--------+--+--------+---------+  Vertebral PSV cm/s 42 EDV cm/s Antegrade  +---------+--------+--+--------+---------+  Summary: Right Carotid: Velocities in the right ICA are consistent with a 60-79%                stenosis. Right sided scan only.                Unable to match elevated velocities obtained at the [facility]                office. Plaque remains within same category of stenosis.  Vertebrals:  Right vertebral artery demonstrates antegrade flow. Subclavians: Normal flow hemodynamics were seen in the right subclavian artery. *See table(s) above for measurements and observations.  Electronically signed by Harold Barban MD on 04/10/2019 at 1:21:06 PM.   Final    Disposition   Pt is being discharged home today in good condition.  Follow-up Plans & Appointments    Follow-up Information    [facility] Follow up on 05/08/2019.   Why: at 8:30am, please arrive by 8am to check in for your follow up echocardiogram Contact information: [place] [address] [phone]       Mark Man, MD Follow up on 05/10/2019.   Specialty: Cardiology Why: at 10:20am for your follow up appt.  Contact information: [place] [address] [phone]          Discharge Instructions    Diet - low sodium heart healthy   Complete by: As directed    Discharge instructions   Complete by: As directed    No driving for 48 hours. No lifting over 5 lbs for 1 week. No sexual activity for 1 week.  Keep procedure site clean & dry. If you notice increased pain, swelling, bleeding or pus, call/return!  You may shower, but no soaking baths/hot tubs/pools for 1 week. Resume Eliquis tonight at 10pm.   Increase activity slowly   Complete by: As directed      Discharge Medications     Medication List    TAKE these medications   acetaminophen 500 MG tablet Commonly known as: TYLENOL Take 500 mg by mouth every 6 (six) hours as needed for moderate pain or headache.   B-12 COMPLIANCE INJECTION IJ Inject 1 Dose as directed every 30 (thirty) days.   clopidogrel 75 MG tablet Commonly known as: PLAVIX TAKE 1 TABLET EVERY DAY WITH BREAKFAST. What changed: See the new instructions.   diltiazem 180 MG 24 hr capsule Commonly known as: CARDIZEM CD TAKE (1) CAPSULE  DAILY. What changed: See the new instructions.   Eliquis 2.5 MG Tabs tablet Generic drug: apixaban TAKE 1 TABLET BY MOUTH TWICE DAILY. What changed: how much to take   ferrous sulfate 325 (65 FE) MG tablet Take 325 mg by mouth daily with breakfast.   furosemide 20 MG tablet Commonly known as: LASIX TAKE ONE TABLET DAILY-MAY TAKE AN ADDITIONAL EXTRA TABLET DAILY IF NEEDED FOR SWELLING. What changed: See the new instructions.   hydrocortisone 2.5 % rectal cream Commonly known as: ANUSOL-HC Place 1 application rectally 2 (two) times daily. Please give pt applicators. Thank you. What changed:   when to take this  reasons to take this   isosorbide mononitrate 60 MG 24 hr tablet Commonly known as: IMDUR Take 1 tablet (60 mg total) by mouth daily after breakfast.   loperamide 2 MG tablet Commonly known as: IMODIUM A-D Take 2-4 mg by mouth 4 (four) times daily as needed for diarrhea or loose stools.   MULTIVITAMIN PO Take 1 tablet by mouth daily.   nitroGLYCERIN 0.4 MG SL tablet Commonly known as: NITROSTAT ONE TABLET UNDER TONGUE WHEN NEEDED FOR CHEST PAIN. MAY REPEAT IN 5 MINUTES. What changed: See the new instructions.   REFRESH OPTIVE OP Place 1 drop into both eyes 2 (two) times daily.   rosuvastatin 20 MG tablet Commonly known as: CRESTOR TAKE 1 TABLET ONCE DAILY. What changed: when to take this   sodium chloride 0.65 % Soln nasal spray  Commonly known as: OCEAN Place 1 spray into both nostrils as needed for congestion.   topiramate 200 MG tablet Commonly known as: TOPAMAX TAKE 1 TABLET ONCE DAILY. What changed:   how much to take  when to take this        Acute coronary syndrome (MI, NSTEMI, STEMI, etc) this admission?: No.   Outstanding Labs/Studies   Outpatient echo  Duration of Discharge Encounter   Greater than 30 minutes including physician time.  SignedLeanor Kail, PA-C 04/26/2019, 5:58 PM

## 2019-04-26 NOTE — Interval H&P Note (Signed)
History and Physical Interval Note:  04/26/2019 12:54 PM  Mark Jackson.  has presented today for surgery, with the diagnosis of Progressive Angina.  The various methods of treatment have been discussed with the patient and family. After consideration of risks, benefits and other options for treatment, the patient has consented to  Procedure(s): LEFT HEART CATH AND CORONARY ANGIOGRAPHY (N/A)  PERCUTANEOUS CORONARY INTERVENTION  as a surgical intervention.  The patient's history has been reviewed, patient examined, no change in status, stable for surgery.  I have reviewed the patient's chart and labs.  Questions were answered to the patient's satisfaction.    Cath Lab Visit (complete for each Cath Lab visit)  Clinical Evaluation Leading to the Procedure:   ACS: No.  Non-ACS:    Anginal Classification: CCS III  Anti-ischemic medical therapy: Maximal Therapy (2 or more classes of medications)  Non-Invasive Test Results: No non-invasive testing performed  Prior CABG: No previous CABG   [name]

## 2019-04-26 NOTE — Progress Notes (Signed)
Pharmacy Consult - Eliquis  S/p cath - for PAF Scr 2.1 Age > 83 years of age  Plan: Eliquis 2.5 mg po BID to start at 10 pm  Thank you Anette Guarneri, PharmD

## 2019-04-26 NOTE — Progress Notes (Signed)
   He is ready for cardiac cath today.  He does note that since isosorbide mononitrate was started, chest pain has resolved.  This medication was started 3 days ago by Dr. Ellyn Hack.  Kidney function is slightly improved this morning with creatinine decreasing from 2.45-2.03 with hydration.  Potassium is 4.4.  All questions were answered.  Patient is ready for the procedure later this morning.

## 2019-04-26 NOTE — Care Management Obs Status (Signed)
[place] NOTIFICATION   Patient Details  Name: Mark Jackson. MRN: [id number] Date of Birth: Jan 04, 1930   Medicare Observation Status Notification Given:  Yes    Zenon Mayo, RN 04/26/2019, 4:27 PM

## 2019-04-27 ENCOUNTER — Encounter (HOSPITAL_COMMUNITY): Payer: Self-pay | Admitting: Cardiology

## 2019-04-28 ENCOUNTER — Telehealth: Payer: Self-pay | Admitting: Cardiology

## 2019-04-28 NOTE — Telephone Encounter (Signed)
New message   Patient would like a call in reference to the procedure that he had this week.

## 2019-04-28 NOTE — Telephone Encounter (Signed)
Patient is requesting a letter to return to work. Works at [employer] and son is requesting this be done. Request that letter is emailed to this address: [email or web address]

## 2019-05-02 ENCOUNTER — Encounter: Payer: Self-pay | Admitting: *Deleted

## 2019-05-05 ENCOUNTER — Encounter: Payer: Self-pay | Admitting: Cardiology

## 2019-05-05 NOTE — Telephone Encounter (Signed)
Called patient informed patient - letter has been  Emailed to the email address that was given

## 2019-05-08 ENCOUNTER — Ambulatory Visit (HOSPITAL_COMMUNITY): Payer: Medicare Other | Attending: Internal Medicine

## 2019-05-08 ENCOUNTER — Ambulatory Visit: Payer: Medicare Other | Admitting: Cardiology

## 2019-05-08 ENCOUNTER — Other Ambulatory Visit: Payer: Self-pay

## 2019-05-08 DIAGNOSIS — I350 Nonrheumatic aortic (valve) stenosis: Secondary | ICD-10-CM

## 2019-05-10 ENCOUNTER — Ambulatory Visit (INDEPENDENT_AMBULATORY_CARE_PROVIDER_SITE_OTHER): Payer: Medicare Other | Admitting: Cardiology

## 2019-05-10 ENCOUNTER — Encounter: Payer: Self-pay | Admitting: Cardiology

## 2019-05-10 ENCOUNTER — Other Ambulatory Visit: Payer: Self-pay

## 2019-05-10 VITALS — BP 150/71 | HR 67 | Temp 96.9°F | Ht 68.0 in | Wt 187.0 lb

## 2019-05-10 DIAGNOSIS — I5189 Other ill-defined heart diseases: Secondary | ICD-10-CM | POA: Diagnosis not present

## 2019-05-10 DIAGNOSIS — I200 Unstable angina: Secondary | ICD-10-CM

## 2019-05-10 DIAGNOSIS — I480 Paroxysmal atrial fibrillation: Secondary | ICD-10-CM

## 2019-05-10 DIAGNOSIS — N184 Chronic kidney disease, stage 4 (severe): Secondary | ICD-10-CM | POA: Diagnosis not present

## 2019-05-10 DIAGNOSIS — I350 Nonrheumatic aortic (valve) stenosis: Secondary | ICD-10-CM

## 2019-05-10 DIAGNOSIS — I25119 Atherosclerotic heart disease of native coronary artery with unspecified angina pectoris: Secondary | ICD-10-CM | POA: Diagnosis not present

## 2019-05-10 DIAGNOSIS — R5383 Other fatigue: Secondary | ICD-10-CM

## 2019-05-10 MED ORDER — ISOSORBIDE MONONITRATE ER 60 MG PO TB24: 30.0000 mg | ORAL_TABLET | Freq: Every day | ORAL | 6 refills | Status: DC

## 2019-05-10 NOTE — Patient Instructions (Addendum)
Medication Instructions:  CONTINUE TAKE IMDUR  1/2 TABLET of 60 mg  DAILY   If you need a refill on your cardiac medications before your next appointment, please call your pharmacy.   Lab work: NOT NEEDED   Testing/Procedures: Not needed  Follow-Up: At [facility], you and your health needs are our priority.  As part of our continuing mission to provide you with exceptional heart care, we have created designated Provider Care Teams.  These Care Teams include your primary Cardiologist (physician) and Advanced Practice Providers (APPs -  Physician Assistants and Nurse Practitioners) who all work together to provide you with the care you need, when you need it. . You will need a follow up appointment in  6 months- MARCH 2021.  Please call our office 2 months in advance to schedule this appointment.  You may see [name], MD or one of the following Advanced Practice Providers on your designated Care Team:   . Rosaria Ferries, PA-C . [name], DNP, ANP  Any Other Special Instructions Will Be Listed Below (If Applicable).
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
## 2019-05-11 ENCOUNTER — Other Ambulatory Visit: Payer: Self-pay | Admitting: Cardiology

## 2019-05-11 ENCOUNTER — Ambulatory Visit (INDEPENDENT_AMBULATORY_CARE_PROVIDER_SITE_OTHER): Payer: Medicare Other

## 2019-05-11 DIAGNOSIS — Z23 Encounter for immunization: Secondary | ICD-10-CM

## 2019-05-11 DIAGNOSIS — E538 Deficiency of other specified B group vitamins: Secondary | ICD-10-CM

## 2019-05-11 MED ORDER — CYANOCOBALAMIN 1000 MCG/ML IJ SOLN
1000.0000 ug | Freq: Once | INTRAMUSCULAR | Status: AC
  Administered 2019-05-11: 1000 ug via INTRAMUSCULAR

## 2019-05-11 NOTE — Progress Notes (Signed)
Per orders of Dr. [name], injection of vitamin B12 given in right deltoid by Gertie Exon, CMA.  Patient tolerated injection well.  He will return in 1 month for his next injection.

## 2019-05-11 NOTE — Addendum Note (Signed)
Addended by: Elmer Bales on: 05/11/2019 03:04 PM   Modules accepted: Orders

## 2019-05-14 ENCOUNTER — Encounter: Payer: Self-pay | Admitting: Cardiology

## 2019-05-14 NOTE — Assessment & Plan Note (Signed)
Rate controlled on diltiazem which she is tolerating well.  On Eliquis for anticoagulation.  Also tolerating well.  I wonder if maybe he could have had a recurrence of A. fib leading to his symptoms recently evaluated.  A. fib would certainly bring on the potential for small vessel ischemia or even symptoms from diastolic function.  He really has not been all that knowledgeable of when he is or is not in A. fib.

## 2019-05-14 NOTE — Assessment & Plan Note (Signed)
He has diastolic dysfunction but not diastolic heart failure.  Heart failure being a syndrome of symptoms that he certainly would have if he has worsening blood pressures or recurrent A. fib. His edema is chronic and not in the setting of PND orthopnea. Closely monitor blood pressures and low threshold to consider adding some molecular reduction.  For now he is on diltiazem and Imdur.  May need to consider re-trial of ARB.  He is on stable dose of furosemide which is more for swelling as opposed to dyspnea symptoms.

## 2019-05-14 NOTE — Assessment & Plan Note (Signed)
Creatinine did well with pre-and postprocedural hydration.

## 2019-05-14 NOTE — Assessment & Plan Note (Signed)
Actually he is doing much better being off beta-blocker.  Continue on calcium channel blocker.  Continue to encourage exercise.

## 2019-05-14 NOTE — Assessment & Plan Note (Signed)
Thankfully, his overlapping ostial and proximal LAD stents are widely patent.  He has suspicious symptoms prior to his catheterization however he clearly no obvious large vessel disease. Seems to be doing better with Imdur, but intolerant of 60 mg.  We will simply reduce him to 30 mg Imdur. Because of fatigue, we will switch him to diltiazem from initially started beta-blocker  . He is on modest dose statin along with Plavix plus Eliquis.  No bleeding issues.  Suspect microvascular disease or potentially spasm in the septal perforator is jailed since there is been some benefit with Imdur.

## 2019-05-14 NOTE — Assessment & Plan Note (Signed)
Aortic stenosis remains moderate by echo.  Interestingly, the pressures that registered by echo appeared to be less significant then what was seen by direct measurement in the Cath Lab.  Would suspect that it is true pressure gradients are closer to 30 than 20, but still remains a moderate stenosis and therefore not locally that would warrant further treatment.

## 2019-05-15 NOTE — Telephone Encounter (Signed)
Rx(s) sent to pharmacy electronically.  

## 2019-05-19 ENCOUNTER — Other Ambulatory Visit: Payer: Self-pay | Admitting: Cardiology

## 2019-05-25 DIAGNOSIS — H52202 Unspecified astigmatism, left eye: Secondary | ICD-10-CM | POA: Diagnosis not present

## 2019-05-25 DIAGNOSIS — H35373 Puckering of macula, bilateral: Secondary | ICD-10-CM | POA: Diagnosis not present

## 2019-06-12 DIAGNOSIS — N183 Chronic kidney disease, stage 3 unspecified: Secondary | ICD-10-CM | POA: Diagnosis not present

## 2019-06-13 ENCOUNTER — Other Ambulatory Visit: Payer: Self-pay

## 2019-06-13 ENCOUNTER — Ambulatory Visit (INDEPENDENT_AMBULATORY_CARE_PROVIDER_SITE_OTHER): Payer: Medicare Other

## 2019-06-13 DIAGNOSIS — E538 Deficiency of other specified B group vitamins: Secondary | ICD-10-CM | POA: Diagnosis not present

## 2019-06-13 MED ORDER — CYANOCOBALAMIN 1000 MCG/ML IJ SOLN
1000.0000 ug | Freq: Once | INTRAMUSCULAR | Status: AC
  Administered 2019-06-13: 1000 ug via INTRAMUSCULAR

## 2019-06-13 NOTE — Progress Notes (Signed)
Per orders of Dr. [name], injection of Vitamin b12, 1000 mcg given left deltoid IM by Kevan Ny, CMA Patient tolerated injection well.  He will return in 1 month for his next injection

## 2019-06-15 ENCOUNTER — Telehealth: Payer: Self-pay | Admitting: Family Medicine

## 2019-06-15 NOTE — Telephone Encounter (Signed)
I left a message asking the patient to call and schedule Medicare AWV with Courtney ([facility]).  If patient calls back, please schedule Medicare Wellness Visit at next available opening.  VDM (Dee-Dee) °

## 2019-06-25 ENCOUNTER — Other Ambulatory Visit: Payer: Self-pay | Admitting: Cardiology

## 2019-06-25 DIAGNOSIS — Z7901 Long term (current) use of anticoagulants: Secondary | ICD-10-CM

## 2019-06-25 DIAGNOSIS — I2510 Atherosclerotic heart disease of native coronary artery without angina pectoris: Secondary | ICD-10-CM

## 2019-07-13 ENCOUNTER — Telehealth: Payer: Self-pay | Admitting: Family Medicine

## 2019-07-13 NOTE — Telephone Encounter (Signed)
I called the patient to schedule AWV with Mark Jackson.  He said that he would like to skip it this year.

## 2019-07-17 ENCOUNTER — Other Ambulatory Visit: Payer: Self-pay

## 2019-07-18 ENCOUNTER — Ambulatory Visit (INDEPENDENT_AMBULATORY_CARE_PROVIDER_SITE_OTHER): Payer: Medicare Other

## 2019-07-18 DIAGNOSIS — E538 Deficiency of other specified B group vitamins: Secondary | ICD-10-CM

## 2019-07-18 MED ORDER — CYANOCOBALAMIN 1000 MCG/ML IJ SOLN
1000.0000 ug | Freq: Once | INTRAMUSCULAR | Status: AC
  Administered 2019-07-18: 1000 ug via INTRAMUSCULAR

## 2019-07-18 NOTE — Progress Notes (Signed)
Per orders of Dr.Hunter, injection of B12  Given in rt deltoid  by Sandford Craze.RN  Patient tolerated injection well.

## 2019-08-09 ENCOUNTER — Encounter: Payer: Self-pay | Admitting: Physician Assistant

## 2019-08-09 ENCOUNTER — Ambulatory Visit (INDEPENDENT_AMBULATORY_CARE_PROVIDER_SITE_OTHER): Payer: Medicare Other | Admitting: Physician Assistant

## 2019-08-09 ENCOUNTER — Ambulatory Visit: Payer: Medicare Other | Attending: Internal Medicine

## 2019-08-09 VITALS — Temp 99.1°F | Ht 68.0 in | Wt 187.0 lb

## 2019-08-09 DIAGNOSIS — R05 Cough: Secondary | ICD-10-CM | POA: Diagnosis not present

## 2019-08-09 DIAGNOSIS — U071 COVID-19: Secondary | ICD-10-CM

## 2019-08-09 DIAGNOSIS — R059 Cough, unspecified: Secondary | ICD-10-CM

## 2019-08-09 DIAGNOSIS — R238 Other skin changes: Secondary | ICD-10-CM

## 2019-08-09 MED ORDER — DOXYCYCLINE HYCLATE 100 MG PO TABS: 100.0000 mg | ORAL_TABLET | Freq: Two times a day (BID) | ORAL | 0 refills | Status: DC

## 2019-08-09 NOTE — Progress Notes (Signed)
Virtual Visit via Video   I connected with Mark Jackson. on 08/09/19 at  3:40 PM EST by a video enabled telemedicine application and verified that I am speaking with the correct person using two identifiers. Location patient: Home Location provider: [facility], Office Persons participating in the virtual visit: Sid Greener., Lucilla Edin (daughter)  I discussed the limitations of evaluation and management by telemedicine and the availability of in person appointments. The patient expressed understanding and agreed to proceed.   Subjective:   HPI:   Patient is requesting evaluation for possible COVID-19.  Symptom onset: two or three weeks for sore throat; Sunday started worsening congestion  Travel/contacts: none that he is aware of, but does state that he currently works and is exposed to people who could've been anywhere  Patient endorses the following symptoms: rhinorrhea, sore throat and productive cough  Patient denies the following symptoms: Fever, sinus headache, ear pain, myalgias, wheezing, SOB, chest pain, difficulty speaking full sentences, malaise, inability to eat/drink  Treatments tried: none  Patient risk factors: Current [id number]-38 risk of complications score: 8 Smoking status: Jove Beyl.  reports that he quit smoking about 51 years ago. His smoking use included pipe and cigars. He quit after 15.00 years of use. He has never used smokeless tobacco. If male, currently pregnant? []   Yes []   No  ROS: See pertinent positives and negatives per HPI.  Patient Active Problem List   Diagnosis Date Noted  . Progressive angina ([place]) 04/21/2019  . Fatigue 11/08/2017  . Hyperglycemia 09/27/2017  . Bilateral lower extremity edema 04/15/2017  . B12 deficiency 01/06/2017  . Chronic diarrhea 01/06/2017  . BPH associated with nocturia 06/15/2016  . Perianal dermatitis 06/19/2015  . Rectal bleeding 04/25/2015  . CAD S/P DES PCI to  proximal LAD 03/22/2015  . Moderate aortic stenosis by prior echocardiogram 03/14/2015  . Abnormal finding on EKG 08/31/2014  . Dyspnea 08/31/2014  . Long term current use of anticoagulant therapy 08/27/2014  . Diastolic dysfunction without heart failure 08/23/2014  . Paroxysmal atrial fibrillation ([place]); CHA2DSVasc - 4; Now on Eliquis 08/20/2014    Class: Diagnosis of  . Chronic kidney disease (CKD), active medical management without dialysis, stage 4 (severe) ([place]) 08/20/2014  . Hereditary and idiopathic peripheral neuropathy 01/12/2014  . Obesity (BMI 30-39.9) 09/03/2013  . H/O syncope 09/03/2013  . Right-sided carotid artery disease; followed by Dr. Ellyn Hack 03/02/2013  . Hyperlipidemia with target LDL less than 70 03/02/2013  . Migraine without aura 10/26/2012  . Anemia 10/23/2008  . GLAUCOMA 10/23/2008  . Essential hypertension 10/22/2008  . Arthropathy 10/22/2008  . Sleep apnea 10/22/2008  . Personal history of prostate cancer 10/22/2008  . History of colonic polyps 10/22/2008    Social History   Tobacco Use  . Smoking status: Former Smoker    Years: 15.00    Types: Pipe, Cigars    Quit date: 08/10/1968    Years since quitting: 51.0  . Smokeless tobacco: Never Used  Substance Use Topics  . Alcohol use: No    Comment: 06/09/2016; 03/13/2015 "I have drank in my life; not more than a gallon in my lifetime; don't drink anymore"    Current Outpatient Medications:  .  acetaminophen (TYLENOL) 500 MG tablet, Take 500 mg by mouth every 6 (six) hours as needed for moderate pain or headache., Disp: , Rfl:  .  apixaban (ELIQUIS) 2.5 MG TABS tablet, Take 1 tablet (2.5 mg total) by mouth  2 (two) times daily., Disp: 180 tablet, Rfl: 0 .  Carboxymethylcellul-Glycerin (REFRESH OPTIVE OP), Place 1 drop into both eyes 2 (two) times daily., Disp: , Rfl:  .  Cyanocobalamin (B-12 COMPLIANCE INJECTION IJ), Inject 1 Dose as directed every 30 (thirty) days., Disp: , Rfl:  .  diltiazem (CARDIZEM  CD) 180 MG 24 hr capsule, Take 1 capsule (180 mg total) by mouth daily., Disp: 90 capsule, Rfl: 3 .  ferrous sulfate 325 (65 FE) MG tablet, Take 325 mg by mouth daily with breakfast., Disp: , Rfl:  .  furosemide (LASIX) 20 MG tablet, TAKE ONE TABLET DAILY-MAY TAKE AN ADDITIONAL EXTRA TABLET DAILY IF NEEDED FOR SWELLING., Disp: 180 tablet, Rfl: 2 .  hydrocortisone (ANUSOL-HC) 2.5 % rectal cream, Place 1 application rectally 2 (two) times daily. Please give pt applicators. Thank you. (Patient taking differently: Place 1 application rectally 2 (two) times daily as needed for hemorrhoids. Please give pt applicators. Thank you.), Disp: 30 g, Rfl: 1 .  Multiple Vitamin (MULTIVITAMIN PO), Take 1 tablet by mouth daily. , Disp: , Rfl:  .  nitroGLYCERIN (NITROSTAT) 0.4 MG SL tablet, ONE TABLET UNDER TONGUE WHEN NEEDED FOR CHEST PAIN. MAY REPEAT IN 5 MINUTES. (Patient taking differently: Place 0.4 mg under the tongue every 5 (five) minutes as needed for chest pain. ), Disp: 25 tablet, Rfl: 0 .  rosuvastatin (CRESTOR) 20 MG tablet, TAKE 1 TABLET ONCE DAILY. (Patient taking differently: Take 20 mg by mouth at bedtime. ), Disp: 90 tablet, Rfl: 3 .  sodium chloride (OCEAN) 0.65 % SOLN nasal spray, Place 1 spray into both nostrils as needed for congestion., Disp: , Rfl:  .  topiramate (TOPAMAX) 200 MG tablet, TAKE 1 TABLET ONCE DAILY. (Patient taking differently: Take 100 mg by mouth at bedtime. ), Disp: 90 tablet, Rfl: 1 .  clopidogrel (PLAVIX) 75 MG tablet, TAKE 1 TABLET EVERY DAY WITH BREAKFAST. (Patient not taking: No sig reported), Disp: 90 tablet, Rfl: 2 .  doxycycline (VIBRA-TABS) 100 MG tablet, Take 1 tablet (100 mg total) by mouth 2 (two) times daily., Disp: 20 tablet, Rfl: 0 .  isosorbide mononitrate (IMDUR) 60 MG 24 hr tablet, Take 0.5 tablets (30 mg total) by mouth daily after breakfast., Disp: 30 tablet, Rfl: 6 .  loperamide (IMODIUM A-D) 2 MG tablet, Take 2-4 mg by mouth 4 (four) times daily as needed for  diarrhea or loose stools. , Disp: , Rfl:   No Known Allergies  Objective:   VITALS: Per patient if applicable, see vitals. GENERAL: Alert, appears well and in no acute distress. HEENT: Atraumatic, conjunctiva clear, no obvious abnormalities on inspection of external nose and ears. NECK: Normal movements of the head and neck. CARDIOPULMONARY: No increased WOB. Speaking in clear sentences. I:E ratio WNL.  MS: Moves all visible extremities without noticeable abnormality. PSYCH: Pleasant and cooperative, well-groomed. Speech normal rate and rhythm. Affect is appropriate. Insight and judgement are appropriate. Attention is focused, linear, and appropriate.  NEURO: CN grossly intact. Oriented as arrived to appointment on time with no prompting. Moves both UE equally.  SKIN: No obvious lesions, wounds, erythema, or cyanosis noted on face or hands.  Assessment and Plan:   Marquavis was seen today for cough.  Diagnoses and all orders for this visit:  Cough -     Novel Coronavirus, NAA ([facility])  Other orders -     doxycycline (VIBRA-TABS) 100 MG tablet; Take 1 tablet (100 mg total) by mouth 2 (two) times daily.   Patient has  a respiratory illness without signs of acute distress or respiratory compromise at this time. No red flags on our discussion.  Due to chronicity of his symptoms and worsening, will treat with oral doxycycline to cover for possible early bacterial URI.  We are going to send patient for COVID-19 testing. As a precaution, they have been advised to remain home until COVID-19 results and then possible further quarantine after that based on results and symptoms.  Discussed with patient that he is high risk and if he has any change in symptoms whatsoever to seek urgent or emergent care. Patient and daughter verbalized understanding.   Advised if they experience a "second sickening" or worsening symptoms as the illness progresses, they are to call the office for further  instructions or seek emergent evaluation for any severe symptoms.   . Reviewed expectations re: course of current medical issues. . Discussed self-management of symptoms. . Outlined signs and symptoms indicating need for more acute intervention. . Patient verbalized understanding and all questions were answered. Marland Kitchen Health Maintenance issues including appropriate healthy diet, exercise, and smoking avoidance were discussed with patient. . See orders for this visit as documented in the electronic medical record.  I discussed the assessment and treatment plan with the patient. The patient was provided an opportunity to ask questions and all were answered. The patient agreed with the plan and demonstrated an understanding of the instructions.   The patient was advised to call back or seek an in-person evaluation if the symptoms worsen or if the condition fails to improve as anticipated.   CMA or LPN served as scribe during this visit. History, Physical, and Plan performed by medical provider. The above documentation has been reviewed and is accurate and complete.  Inda Coke, [place] 08/09/2019

## 2019-08-10 ENCOUNTER — Telehealth: Payer: Self-pay | Admitting: *Deleted

## 2019-08-10 LAB — NOVEL CORONAVIRUS, NAA: SARS-CoV-2, NAA: DETECTED — AB

## 2019-08-10 NOTE — Telephone Encounter (Signed)
Spoke to pt asked him how he was feeling? Pt said he is feeling better, got a good night sleep and is cranky today. Asked how cough is? He said still a dry cough. Denies fever, chills, SOB or chest pain. Told pt I am glad you are feeling better, continue to drink plenty of fluids and rest. Pt verbalized understanding and said that they took his wife to [place] yesterday and she tested positive for COVID-19 but she is back home. Told him okay I will let Samantha know. Samantha notified.

## 2019-08-14 ENCOUNTER — Other Ambulatory Visit: Payer: Self-pay | Admitting: Unknown Physician Specialty

## 2019-08-14 ENCOUNTER — Telehealth: Payer: Self-pay | Admitting: Unknown Physician Specialty

## 2019-08-14 DIAGNOSIS — U071 COVID-19: Secondary | ICD-10-CM

## 2019-08-14 NOTE — Telephone Encounter (Signed)
I connected by phone with Mark Jackson. on 08/14/2019 at 9:16 AM to discuss the potential use of an new treatment for mild to moderate COVID-19 viral infection in non-hospitalized patients.  This patient is a 86 y.o. male that meets the FDA criteria for Emergency Use Authorization of bamlanivimab or casirivimab\imdevimab.  Has a (+) direct SARS-CoV-2 viral test result  Has mild or moderate COVID-19   Is ? 86 years of age and weighs ? 40 kg  Is NOT hospitalized due to COVID-19  Is NOT requiring oxygen therapy or requiring an increase in baseline oxygen flow rate due to COVID-19  Is within 10 days of symptom onset  Has at least one of the high risk factor(s) for progression to severe COVID-19 and/or hospitalization as defined in EUA.  Specific high risk criteria : >/= 84 yo   I have spoken and communicated the following to the patient or parent/caregiver:  1. FDA has authorized the emergency use of bamlanivimab and casirivimab\imdevimab for the treatment of mild to moderate COVID-19 in adults and pediatric patients with positive results of direct SARS-CoV-2 viral testing who are 52 years of age and older weighing at least 40 kg, and who are at high risk for progressing to severe COVID-19 and/or hospitalization.  2. The significant known and potential risks and benefits of bamlanivimab and casirivimab\imdevimab, and the extent to which such potential risks and benefits are unknown.  3. Information on available alternative treatments and the risks and benefits of those alternatives, including clinical trials.  4. Patients treated with bamlanivimab and casirivimab\imdevimab should continue to self-isolate and use infection control measures (e.g., wear mask, isolate, social distance, avoid sharing personal items, clean and disinfect "high touch" surfaces, and frequent handwashing) according to CDC guidelines.   5. The patient or parent/caregiver has the option to accept or refuse  bamlanivimab or casirivimab\imdevimab .  After reviewing this information with the patient, The patient agreed to proceed with receiving the casirivimab\imdevimab infusion and will be provided a copy of the Fact sheet prior to receiving the infusion.Kathrine Haddock 08/14/2019 9:16 AM Sx onset 12/27

## 2019-08-15 ENCOUNTER — Ambulatory Visit: Payer: Medicare Other

## 2019-08-16 ENCOUNTER — Other Ambulatory Visit: Payer: Self-pay | Admitting: Family Medicine

## 2019-08-16 ENCOUNTER — Ambulatory Visit (HOSPITAL_COMMUNITY)
Admission: RE | Admit: 2019-08-16 | Discharge: 2019-08-16 | Disposition: A | Discharge status: Home / Self Care | Payer: Medicare Other | Source: Ambulatory Visit | Attending: Pulmonary Disease | Admitting: Pulmonary Disease

## 2019-08-16 DIAGNOSIS — U071 COVID-19: Secondary | ICD-10-CM | POA: Diagnosis not present

## 2019-08-16 DIAGNOSIS — Z23 Encounter for immunization: Secondary | ICD-10-CM | POA: Diagnosis not present

## 2019-08-16 MED ORDER — EPINEPHRINE 0.3 MG/0.3ML IJ SOAJ: 0.3000 mg | Freq: Once | INTRAMUSCULAR | Status: DC | PRN

## 2019-08-16 MED ORDER — ALBUTEROL SULFATE HFA 108 (90 BASE) MCG/ACT IN AERS: 2.0000 | INHALATION_SPRAY | Freq: Once | RESPIRATORY_TRACT | Status: DC | PRN

## 2019-08-16 MED ORDER — DIPHENHYDRAMINE HCL 50 MG/ML IJ SOLN: 50.0000 mg | Freq: Once | INTRAMUSCULAR | Status: DC | PRN

## 2019-08-16 MED ORDER — SODIUM CHLORIDE 0.9 % IV SOLN: INTRAVENOUS | Status: DC | PRN

## 2019-08-16 MED ORDER — SODIUM CHLORIDE 0.9 % IV SOLN
Freq: Once | INTRAVENOUS | Status: AC
  Filled 2019-08-16: qty 10

## 2019-08-16 MED ORDER — METHYLPREDNISOLONE SODIUM SUCC 125 MG IJ SOLR: 125.0000 mg | Freq: Once | INTRAMUSCULAR | Status: DC | PRN

## 2019-08-16 MED ORDER — FAMOTIDINE IN NACL 20-0.9 MG/50ML-% IV SOLN: 20.0000 mg | Freq: Once | INTRAVENOUS | Status: DC | PRN

## 2019-08-16 NOTE — Progress Notes (Signed)
  Diagnosis: COVID-19  Physician: Dr. Joya Gaskins  Procedure: [facility] Med: casirivimab\imdevimab infusion - Provided patient with casirivimab\imdevimab fact sheet for patients, parents and caregivers prior to infusion.  Complications: No immediate complications noted.  Discharge: Discharged home   [place], [place] 08/16/2019

## 2019-08-16 NOTE — Discharge Instructions (Signed)

## 2019-08-25 ENCOUNTER — Other Ambulatory Visit: Payer: Self-pay | Admitting: Cardiology

## 2019-08-25 DIAGNOSIS — Z7901 Long term (current) use of anticoagulants: Secondary | ICD-10-CM

## 2019-08-25 DIAGNOSIS — I2510 Atherosclerotic heart disease of native coronary artery without angina pectoris: Secondary | ICD-10-CM

## 2019-09-11 ENCOUNTER — Other Ambulatory Visit: Payer: Self-pay

## 2019-09-12 ENCOUNTER — Encounter: Payer: Self-pay | Admitting: Family Medicine

## 2019-09-12 ENCOUNTER — Ambulatory Visit: Payer: Medicare Other

## 2019-09-12 ENCOUNTER — Ambulatory Visit (INDEPENDENT_AMBULATORY_CARE_PROVIDER_SITE_OTHER): Payer: Medicare Other | Admitting: Family Medicine

## 2019-09-12 VITALS — BP 140/70 | HR 68 | Temp 97.3°F | Ht 68.0 in | Wt 191.0 lb

## 2019-09-12 DIAGNOSIS — E538 Deficiency of other specified B group vitamins: Secondary | ICD-10-CM

## 2019-09-12 DIAGNOSIS — N184 Chronic kidney disease, stage 4 (severe): Secondary | ICD-10-CM | POA: Diagnosis not present

## 2019-09-12 DIAGNOSIS — I2510 Atherosclerotic heart disease of native coronary artery without angina pectoris: Secondary | ICD-10-CM

## 2019-09-12 DIAGNOSIS — I10 Essential (primary) hypertension: Secondary | ICD-10-CM

## 2019-09-12 DIAGNOSIS — I25119 Atherosclerotic heart disease of native coronary artery with unspecified angina pectoris: Secondary | ICD-10-CM

## 2019-09-12 DIAGNOSIS — L89152 Pressure ulcer of sacral region, stage 2: Secondary | ICD-10-CM | POA: Diagnosis not present

## 2019-09-12 DIAGNOSIS — E785 Hyperlipidemia, unspecified: Secondary | ICD-10-CM

## 2019-09-12 DIAGNOSIS — I480 Paroxysmal atrial fibrillation: Secondary | ICD-10-CM

## 2019-09-12 DIAGNOSIS — Z9861 Coronary angioplasty status: Secondary | ICD-10-CM

## 2019-09-12 DIAGNOSIS — M19049 Primary osteoarthritis, unspecified hand: Secondary | ICD-10-CM

## 2019-09-12 LAB — COMPREHENSIVE METABOLIC PANEL
ALT: 9 U/L (ref 0–53)
AST: 16 U/L (ref 0–37)
Albumin: 3.6 g/dL (ref 3.5–5.2)
Alkaline Phosphatase: 60 U/L (ref 39–117)
BUN: 42 mg/dL — ABNORMAL HIGH (ref 6–23)
CO2: 24 mEq/L (ref 19–32)
Calcium: 8.7 mg/dL (ref 8.4–10.5)
Chloride: 108 mEq/L (ref 96–112)
Creatinine, Ser: 2.32 mg/dL — ABNORMAL HIGH (ref 0.40–1.50)
GFR: 26.76 mL/min — ABNORMAL LOW (ref >60.00)
Glucose, Bld: 90 mg/dL (ref 70–99)
Potassium: 4.2 mEq/L (ref 3.5–5.1)
Sodium: 140 mEq/L (ref 135–145)
Total Bilirubin: 0.4 mg/dL (ref 0.2–1.2)
Total Protein: 6.8 g/dL (ref 6.0–8.3)

## 2019-09-12 LAB — CBC WITH DIFFERENTIAL/PLATELET
Basophils Absolute: 0.1 10*3/uL (ref 0.0–0.1)
Basophils Relative: 0.6 % (ref 0.0–3.0)
Eosinophils Absolute: 0.4 10*3/uL (ref 0.0–0.7)
Eosinophils Relative: 3.4 % (ref 0.0–5.0)
HCT: 32.7 % — ABNORMAL LOW (ref 39.0–52.0)
Hemoglobin: 10.8 g/dL — ABNORMAL LOW (ref 13.0–17.0)
Lymphocytes Relative: 14.3 % (ref 12.0–46.0)
Lymphs Abs: 1.6 10*3/uL (ref 0.7–4.0)
MCHC: 32.9 g/dL (ref 30.0–36.0)
MCV: 98.9 fl (ref 78.0–100.0)
Monocytes Absolute: 1.3 10*3/uL — ABNORMAL HIGH (ref 0.1–1.0)
Monocytes Relative: 11.9 % (ref 3.0–12.0)
Neutro Abs: 7.7 10*3/uL (ref 1.4–7.7)
Neutrophils Relative %: 69.8 % (ref 43.0–77.0)
Platelets: 235 10*3/uL (ref 150.0–400.0)
RBC: 3.31 Mil/uL — ABNORMAL LOW (ref 4.22–5.81)
RDW: 13.5 % (ref 11.5–15.5)
WBC: 11 10*3/uL — ABNORMAL HIGH (ref 4.0–10.5)

## 2019-09-12 MED ORDER — CYANOCOBALAMIN 1000 MCG/ML IJ SOLN
1000.0000 ug | Freq: Once | INTRAMUSCULAR | Status: AC
  Administered 2019-09-12: 1000 ug via INTRAMUSCULAR

## 2019-09-12 NOTE — Progress Notes (Signed)
Phone [phone] In person visit   Subjective:   Mark Jackson. is a 86 y.o. year old very pleasant male patient who presents for/with See problem oriented charting Chief Complaint  Patient presents with  . Follow-up  . Hyperlipidemia  . Hypertension   This visit occurred during the SARS-CoV-2 public health emergency.  Safety protocols were in place, including screening questions prior to the visit, additional usage of staff PPE, and extensive cleaning of exam room while observing appropriate contact time as indicated for disinfecting solutions.   Past Medical History-  Patient Active Problem List   Diagnosis Date Noted  . CAD S/P DES PCI to proximal LAD 03/22/2015    Priority: High  . Diastolic dysfunction without heart failure 08/23/2014    Priority: High  . Paroxysmal atrial fibrillation ([place]); CHA2DSVasc - 4; Now on Eliquis 08/20/2014    Priority: High    Class: Diagnosis of  . Chronic kidney disease (CKD), active medical management without dialysis, stage 4 (severe) ([place]) 08/20/2014    Priority: High  . Personal history of prostate cancer 10/22/2008    Priority: High  . Hyperglycemia 09/27/2017    Priority: Medium  . B12 deficiency 01/06/2017    Priority: Medium  . BPH associated with nocturia 06/15/2016    Priority: Medium  . Moderate aortic stenosis by prior echocardiogram 03/14/2015    Priority: Medium  . Hereditary and idiopathic peripheral neuropathy 01/12/2014    Priority: Medium  . H/O syncope 09/03/2013    Priority: Medium  . Right-sided carotid artery disease; followed by Dr. Ellyn Hack 03/02/2013    Priority: Medium  . Hyperlipidemia with target LDL less than 70 03/02/2013    Priority: Medium  . Migraine without aura 10/26/2012    Priority: Medium  . Anemia 10/23/2008    Priority: Medium  . Essential hypertension 10/22/2008    Priority: Medium  . Sleep apnea 10/22/2008    Priority: Medium  . Fatigue 11/08/2017    Priority: Low  . Chronic  diarrhea 01/06/2017    Priority: Low  . Perianal dermatitis 06/19/2015    Priority: Low  . Rectal bleeding 04/25/2015    Priority: Low  . Abnormal finding on EKG 08/31/2014    Priority: Low  . Dyspnea 08/31/2014    Priority: Low  . Long term current use of anticoagulant therapy 08/27/2014    Priority: Low  . Obesity (BMI 30-39.9) 09/03/2013    Priority: Low  . GLAUCOMA 10/23/2008    Priority: Low  . Arthropathy 10/22/2008    Priority: Low  . History of colonic polyps 10/22/2008    Priority: Low  . Arthritis of hand 09/12/2019  . Progressive angina ([place]) 04/21/2019  . Bilateral lower extremity edema 04/15/2017    Medications- reviewed and updated Current Outpatient Medications  Medication Sig Dispense Refill  . acetaminophen (TYLENOL) 500 MG tablet Take 500 mg by mouth every 6 (six) hours as needed for moderate pain or headache.    . Carboxymethylcellul-Glycerin (REFRESH OPTIVE OP) Place 1 drop into both eyes 2 (two) times daily.    . clopidogrel (PLAVIX) 75 MG tablet TAKE 1 TABLET EVERY DAY WITH BREAKFAST. (Patient not taking: No sig reported) 90 tablet 2  . Cyanocobalamin (B-12 COMPLIANCE INJECTION IJ) Inject 1 Dose as directed every 30 (thirty) days.    Marland Kitchen diltiazem (CARDIZEM CD) 180 MG 24 hr capsule Take 1 capsule (180 mg total) by mouth daily. 90 capsule 3  . ELIQUIS 2.5 MG TABS tablet TAKE 1 TABLET BY MOUTH  TWICE DAILY. 180 tablet 0  . ferrous sulfate 325 (65 FE) MG tablet Take 325 mg by mouth daily with breakfast.    . furosemide (LASIX) 20 MG tablet TAKE ONE TABLET DAILY-MAY TAKE AN ADDITIONAL EXTRA TABLET DAILY IF NEEDED FOR SWELLING. 180 tablet 2  . hydrocortisone (ANUSOL-HC) 2.5 % rectal cream Place 1 application rectally 2 (two) times daily. Please give pt applicators. Thank you. (Patient taking differently: Place 1 application rectally 2 (two) times daily as needed for hemorrhoids. Please give pt applicators. Thank you.) 30 g 1  . isosorbide mononitrate (IMDUR) 60 MG  24 hr tablet Take 0.5 tablets (30 mg total) by mouth daily after breakfast. 30 tablet 6  . loperamide (IMODIUM A-D) 2 MG tablet Take 2-4 mg by mouth 4 (four) times daily as needed for diarrhea or loose stools.     . Multiple Vitamin (MULTIVITAMIN PO) Take 1 tablet by mouth daily.     . nitroGLYCERIN (NITROSTAT) 0.4 MG SL tablet ONE TABLET UNDER TONGUE WHEN NEEDED FOR CHEST PAIN. MAY REPEAT IN 5 MINUTES. (Patient taking differently: Place 0.4 mg under the tongue every 5 (five) minutes as needed for chest pain. ) 25 tablet 0  . rosuvastatin (CRESTOR) 20 MG tablet Take 1 tablet (20 mg total) by mouth at bedtime. 90 tablet 1  . sodium chloride (OCEAN) 0.65 % SOLN nasal spray Place 1 spray into both nostrils as needed for congestion.    . topiramate (TOPAMAX) 200 MG tablet TAKE 1 TABLET ONCE DAILY. (Patient taking differently: Take 100 mg by mouth at bedtime. ) 90 tablet 1   No current facility-administered medications for this visit.     Objective:  BP 140/70   Pulse 68   Temp (!) 97.3 F (36.3 C)   Ht 5\' 8"  (1.727 m)   Wt 191 lb (86.6 kg)   SpO2 99%   BMI 29.04 kg/m  Gen: NAD, resting comfortably CV: RRR stable murmur Lungs: CTAB no crackles, wheeze, rhonchi Abdomen: soft/nontender/nondistended/normal bowel sounds. No rebound or guarding.  Ext: 2+ edema Skin: warm, dry, upper buttocks crease with open are about 2 cm surrounded by whitish discoloration/maceration    Assessment and Plan   #hypertension #CKD stage IV S: compliant with Diltiazem 180mg -previously on beta-blocker but had fatigue.  Also on Imdur 30 mg-did not tolerate higher dose. On lasix 20mg  daily- takes 1.5 pills twice a week.     CKD stage IV has been stable with creatinine in 1.9-2.2 range.  Follows with Dr. Moshe Cipro at [facility].  BP Readings from Last 3 Encounters:  09/12/19 140/70  08/16/19 (!) 141/68  05/10/19 (!) 150/71  A/P: hopefully CKD stage IV stable- update bmp today. Blood pressure slightly  high but think he is fluid overloaded- also has some fatigue. From avs ""I am concerned some of your fatigue could be coming from extra fluid in your body. Lets have you take lasix 20 mg separated by 6 hours for 3-5 days to see if that helps you. I think it will- if you have weight gain or leg swelling again in future please see [place] back for repeat evaluation. Resume normal regimen after that. "  #hyperlipidemia #CAD S: Patient is compliant with Plavix for known CAD. Denies chest pain or shortness of breath other than occasional pain across his chest in late afternoon- not with exertion.   History of stent.  Last catheterization September 2020 with a widely patent overlapping ostial-proximal LAD stents.  Also has carotid artery disease followed Dr.  Harding-last checked April 10, 2019 with stenosis of 60 to 79% in right ICA-with right-sided scan only.  compliant with crestor 20mg  Lab Results  Component Value Date   CHOL 110 03/16/2019   HDL 41.10 03/16/2019   LDLCALC 54 03/16/2019   LDLDIRECT 52.0 09/12/2018   TRIG 75.0 03/16/2019   CHOLHDL 3 03/16/2019   A/P: CAD is asymptomatic-continue statin and clopidogrel . I do not think fatigue is sign of worsening CAD- rather more likely post covid or fluid overload related Lipids with excellent control with last LDL of 54 under goal of 70 or less given known CAD  #Atrial fibrillation S: Patient is rate controlled with diltiazem.  He is anticoagulated with Eliquis A/P: doing well- continue current meds    #B12 deficiency S: History of low B12 levels.  Injections in the past.  Most recently has been taking monthly injections for b12 other than skipped last month  Lab Results  Component Value Date   VITAMINB12 >1500 (H) 03/16/2019   A/P: looked good last visit- give b12 today and recheck levels next visit   #08/08/2020 covid 19 infection-patient has recovered well from this other than lingering fatigue. Can still get vaccine but have to wait  minimum 90 days from antibody infusion. Notes worsened mental fog after covid 19- felt like maybe had some prior. Feels like slowing down overall. No shortnss of breath reported. Legs are slightly more swllen bilaterally. Weight up 4 lbs in 4 weeks- he feels like was related to Christmas.   Arthritis of hand S: worsening issues with finger mobility over last year- cannot fully close right hand particularly index finger. Has some swelling at PIP joint. He is right handed.  A/P: could be arthritic changes. With worsening range of motion- refer to hand surgeon for their opinion.   # Buttock irritation S:crease above buttocks gets irritated on and off for last year.  Miconazole helps when flared up. Also sounds like using barrier cream A/P: I would usually try antifungal or barrier cream but he has tried both of those.  I will refer to wound center for their expertise  Recommended follow up: 1 month follow up to see how fluid is doing and how your fatigue is doing  Lab/Order associations:   ICD-10-CM   1. Essential hypertension  I10 CBC with Differential/Platelet    Comprehensive metabolic panel  2. Hyperlipidemia with target LDL less than 70  E78.5   3. CAD S/P DES PCI to proximal LAD  I25.119   4. Paroxysmal atrial fibrillation ([place]); CHA2DSVasc - 4; Now on Eliquis  I48.0   5. Chronic kidney disease (CKD), active medical management without dialysis, stage 4 (severe) ([facility])  N18.4   6. B12 deficiency  E53.8 cyanocobalamin ((VITAMIN B-12)) injection 1,000 mcg  7. Arthritis of hand  M19.049 Ambulatory referral to Hand Surgery    Meds ordered this encounter  Medications  . cyanocobalamin ((VITAMIN B-12)) injection 1,000 mcg   Return precautions advised.  [name], MD

## 2019-09-12 NOTE — Assessment & Plan Note (Signed)
S: worsening issues with finger mobility over last year- cannot fully close right hand particularly index finger. Has some swelling at PIP joint. He is right handed.  A/P: could be arthritic changes. With worsening range of motion- refer to hand surgeon for their opinion.

## 2019-09-12 NOTE — Patient Instructions (Addendum)
We will call you within two weeks about your referral to hand surgery and wound center . If you do not hear within 3 weeks, give [place] a call.   We will call you within two weeks about your referral to wound center for upper buttocks wound. If you do not hear within 3 weeks, give [place] a call.   Please stop by lab before you go If you do not have mychart- we will call you about results within 5 business days of [place] receiving them.  If you have mychart- we will send your results within 3 business days of [place] receiving them.  If abnormal or we want to clarify a result, we will call or mychart you to make sure you receive the message.  If you have questions or concerns or don't hear within 5-7 days, please send [place] a message or call us.   I am concerned some of your fatigue could be coming from extra fluid in your body. Lets have you take lasix 20 mg separated by 6 hours for 3-5 days to see if that helps you. I think it will- if you have weight gain or leg swelling again in future please see [place] back for repeat evaluation. Resume normal regimen after that.   Have to wait 90 days from infusion to get vaccine  Recommended follow up: 1 month follow up to see how fluid is doing and how your fatigue is doing

## 2019-09-14 ENCOUNTER — Telehealth: Payer: Self-pay

## 2019-09-14 NOTE — Telephone Encounter (Signed)
See lab results for documentation.  

## 2019-09-14 NOTE — Telephone Encounter (Signed)
Pt returning call regarding lab results 

## 2019-09-20 ENCOUNTER — Other Ambulatory Visit: Payer: Self-pay | Admitting: Cardiology

## 2019-09-26 DIAGNOSIS — M79642 Pain in left hand: Secondary | ICD-10-CM | POA: Diagnosis not present

## 2019-09-26 DIAGNOSIS — M79641 Pain in right hand: Secondary | ICD-10-CM | POA: Diagnosis not present

## 2019-09-26 DIAGNOSIS — M19049 Primary osteoarthritis, unspecified hand: Secondary | ICD-10-CM | POA: Diagnosis not present

## 2019-09-28 ENCOUNTER — Encounter (HOSPITAL_BASED_OUTPATIENT_CLINIC_OR_DEPARTMENT_OTHER): Payer: Medicare Other | Admitting: Internal Medicine

## 2019-10-09 ENCOUNTER — Encounter (HOSPITAL_BASED_OUTPATIENT_CLINIC_OR_DEPARTMENT_OTHER): Payer: Medicare Other | Admitting: Internal Medicine

## 2019-10-10 ENCOUNTER — Encounter: Payer: Self-pay | Admitting: Family Medicine

## 2019-10-10 ENCOUNTER — Ambulatory Visit (INDEPENDENT_AMBULATORY_CARE_PROVIDER_SITE_OTHER): Payer: Medicare Other | Admitting: Family Medicine

## 2019-10-10 ENCOUNTER — Other Ambulatory Visit: Payer: Self-pay

## 2019-10-10 VITALS — BP 130/60 | HR 68 | Temp 98.0°F | Ht 68.0 in | Wt 191.2 lb

## 2019-10-10 DIAGNOSIS — I2510 Atherosclerotic heart disease of native coronary artery without angina pectoris: Secondary | ICD-10-CM | POA: Diagnosis not present

## 2019-10-10 DIAGNOSIS — Z9861 Coronary angioplasty status: Secondary | ICD-10-CM

## 2019-10-10 DIAGNOSIS — E538 Deficiency of other specified B group vitamins: Secondary | ICD-10-CM

## 2019-10-10 DIAGNOSIS — I10 Essential (primary) hypertension: Secondary | ICD-10-CM

## 2019-10-10 DIAGNOSIS — I5189 Other ill-defined heart diseases: Secondary | ICD-10-CM | POA: Diagnosis not present

## 2019-10-10 MED ORDER — CYANOCOBALAMIN 1000 MCG PO CAPS: 1.0000 | ORAL_CAPSULE | Freq: Every day | ORAL | 3 refills | Status: AC

## 2019-10-10 MED ORDER — CYANOCOBALAMIN 1000 MCG/ML IJ SOLN: 1000.0000 ug | Freq: Once | INTRAMUSCULAR | Status: DC

## 2019-10-10 NOTE — Assessment & Plan Note (Addendum)
S: At last visit patient was experiencing fatigue post Covid.  He was also suffering from increased swelling in his toes/feet.  At that time he had been taking Lasix 20 mg daily 5 days a week and 30 mg twice a week.  At last visit we instructed him to take twice daily dosing of Lasix for 3 to 5 days and he took twice daily Lasix for 5 days within a 7-day.  He noted improved control of his swelling with this transition-he has continued to use 20 mg Lasix daily and once a week takes an additional dose 6 hours after initial dose and this seems to be more effective for controlling edema.   A/P: Diastolic dysfunction appears to be slightly improved in regards to edema-he will continue Lasix 20 mg daily and once a week and take an extra tablet 6 hours after initial dose.  In the future we could consider twice daily dosing increases for short periods. -We discussed repeating BMP but he follows up with renal with Dr. Moshe Cipro next month and he wanted to decline for now

## 2019-10-10 NOTE — Patient Instructions (Addendum)
Lets try over the counter b12/cyanocobalamin 1000 mcg daily. Next tim ewe see each other  - lets check b12 to make sure remains at a good level- if you have any recurrence of prior arm issues lets restart injections and stick with them long term.   Blood pressure looks better- continue current meds  3-4 months follow up or sooner if needed such as if swelling worsens or blodo pressure goes up

## 2019-10-10 NOTE — Progress Notes (Addendum)
Phone [phone] In person visit   Subjective:   Mark Jackson. is a 86 y.o. year old very pleasant male patient who presents for/with See problem oriented charting Chief Complaint  Patient presents with  . Follow-up  . Fatigue  . fluid  . b12   This visit occurred during the SARS-CoV-2 public health emergency.  Safety protocols were in place, including screening questions prior to the visit, additional usage of staff PPE, and extensive cleaning of exam room while observing appropriate contact time as indicated for disinfecting solutions.   Past Medical History-  Patient Active Problem List   Diagnosis Date Noted  . CAD S/P DES PCI to proximal LAD 03/22/2015    Priority: High  . Diastolic dysfunction without heart failure 08/23/2014    Priority: High  . Paroxysmal atrial fibrillation ([place]); CHA2DSVasc - 4; Now on Eliquis 08/20/2014    Priority: High    Class: Diagnosis of  . Chronic kidney disease (CKD), active medical management without dialysis, stage 4 (severe) ([place]) 08/20/2014    Priority: High  . Personal history of prostate cancer 10/22/2008    Priority: High  . Hyperglycemia 09/27/2017    Priority: Medium  . B12 deficiency 01/06/2017    Priority: Medium  . BPH associated with nocturia 06/15/2016    Priority: Medium  . Moderate aortic stenosis by prior echocardiogram 03/14/2015    Priority: Medium  . Hereditary and idiopathic peripheral neuropathy 01/12/2014    Priority: Medium  . H/O syncope 09/03/2013    Priority: Medium  . Right-sided carotid artery disease; followed by Dr. Ellyn Hack 03/02/2013    Priority: Medium  . Hyperlipidemia with target LDL less than 70 03/02/2013    Priority: Medium  . Migraine without aura 10/26/2012    Priority: Medium  . Anemia 10/23/2008    Priority: Medium  . Essential hypertension 10/22/2008    Priority: Medium  . Sleep apnea 10/22/2008    Priority: Medium  . Fatigue 11/08/2017    Priority: Low  . Chronic diarrhea  01/06/2017    Priority: Low  . Perianal dermatitis 06/19/2015    Priority: Low  . Rectal bleeding 04/25/2015    Priority: Low  . Abnormal finding on EKG 08/31/2014    Priority: Low  . Dyspnea 08/31/2014    Priority: Low  . Long term current use of anticoagulant therapy 08/27/2014    Priority: Low  . Obesity (BMI 30-39.9) 09/03/2013    Priority: Low  . GLAUCOMA 10/23/2008    Priority: Low  . Arthropathy 10/22/2008    Priority: Low  . History of colonic polyps 10/22/2008    Priority: Low  . Arthritis of hand 09/12/2019  . Progressive angina ([place]) 04/21/2019  . Bilateral lower extremity edema 04/15/2017    Medications- reviewed and updated Current Outpatient Medications  Medication Sig Dispense Refill  . acetaminophen (TYLENOL) 500 MG tablet Take 500 mg by mouth every 6 (six) hours as needed for moderate pain or headache.    . Carboxymethylcellul-Glycerin (REFRESH OPTIVE OP) Place 1 drop into both eyes 2 (two) times daily.    . clopidogrel (PLAVIX) 75 MG tablet TAKE 1 TABLET EVERY DAY WITH BREAKFAST. 90 tablet 1  . diltiazem (CARDIZEM CD) 180 MG 24 hr capsule Take 1 capsule (180 mg total) by mouth daily. 90 capsule 3  . ELIQUIS 2.5 MG TABS tablet TAKE 1 TABLET BY MOUTH TWICE DAILY. 180 tablet 0  . ferrous sulfate 325 (65 FE) MG tablet Take 325 mg by mouth daily with  breakfast.    . furosemide (LASIX) 20 MG tablet TAKE ONE TABLET DAILY-MAY TAKE AN ADDITIONAL EXTRA TABLET DAILY IF NEEDED FOR SWELLING. 180 tablet 2  . hydrocortisone (ANUSOL-HC) 2.5 % rectal cream Place 1 application rectally 2 (two) times daily. Please give pt applicators. Thank you. (Patient taking differently: Place 1 application rectally 2 (two) times daily as needed for hemorrhoids. Please give pt applicators. Thank you.) 30 g 1  . loperamide (IMODIUM A-D) 2 MG tablet Take 2-4 mg by mouth 4 (four) times daily as needed for diarrhea or loose stools.     . Multiple Vitamin (MULTIVITAMIN PO) Take 1 tablet by mouth  daily.     . nitroGLYCERIN (NITROSTAT) 0.4 MG SL tablet ONE TABLET UNDER TONGUE WHEN NEEDED FOR CHEST PAIN. MAY REPEAT IN 5 MINUTES. (Patient taking differently: Place 0.4 mg under the tongue every 5 (five) minutes as needed for chest pain. ) 25 tablet 0  . rosuvastatin (CRESTOR) 20 MG tablet Take 1 tablet (20 mg total) by mouth at bedtime. 90 tablet 1  . sodium chloride (OCEAN) 0.65 % SOLN nasal spray Place 1 spray into both nostrils as needed for congestion.    . topiramate (TOPAMAX) 200 MG tablet TAKE 1 TABLET ONCE DAILY. (Patient taking differently: Take 100 mg by mouth at bedtime. ) 90 tablet 1  . Cyanocobalamin 1000 MCG CAPS Take 1 capsule by mouth daily. 90 capsule 3   Current Facility-Administered Medications  Medication Dose Route Frequency Provider Last Rate Last Admin  . cyanocobalamin ((VITAMIN B-12)) injection 1,000 mcg  1,000 mcg Intramuscular Once [name], MD         Objective:  BP 130/60   Pulse 68   Temp 98 F (36.7 C) (Temporal)   Ht 5\' 8"  (1.727 m)   Wt 191 lb 3.2 oz (86.7 kg)   SpO2 97%   BMI 29.07 kg/m  Gen: NAD, resting comfortably CV: RRR no murmurs rubs or gallops Lungs: CTAB no crackles, wheeze, rhonchi Ext: slightly better edema (still likely 2+) Skin: warm, dry    Assessment and Plan   #Status post COVID-19 infection in late December-has lingering fatigue.  Finds naps very helpful for refreshment even just 10 minutes.  He has noted some more frequent migraines-remains on Topamax.  He would like to give this more time before considering further evaluation/work-up  #hypertension S: compliant with Lasix 20 mg daily 6 days a week and 20 mg twice a day 1 day a week, diltiazem 180 mg extended release BP Readings from Last 3 Encounters:  10/10/19 130/60  09/12/19 140/70  08/16/19 (!) 141/68  A/P: Excellent control with improved fluid status-continue current medications  #Hand arthritis-patient was referred to hand surgeon's request  previously-essentially he discovered he has arthritis and he does not want any surgical intervention so this will continue to be monitored.  #Buttocks irritation - cleared with jock itch meds.  Previously referred to wound care but he ultimately canceled this given improvement in symptoms  Diastolic dysfunction without heart failure S: At last visit patient was experiencing fatigue post Covid.  He was also suffering from increased swelling in his toes/feet.  At that time he had been taking Lasix 20 mg daily 5 days a week and 30 mg twice a week.  At last visit we instructed him to take twice daily dosing of Lasix for 3 to 5 days and he took twice daily Lasix for 5 days within a 7-day.  He noted improved control of his swelling  with this transition-he has continued to use 20 mg Lasix daily and once a week takes an additional dose 6 hours after initial dose and this seems to be more effective for controlling edema.   A/P: CHF appears to be slightly improved in regards to edema-he will continue Lasix 20 mg daily and once a week and take an extra tablet 6 hours after initial dose.  In the future we could consider twice daily dosing increases for short periods. -We discussed repeating BMP but he follows up with renal with Dr. Moshe Cipro next month and he wanted to decline for now   B12 deficiency S: Patient originally presented with "left arm pumping" random movements in his left arm to the emergency room years ago.  He has been on B12 injections for years and does not remember trying orals at any point. A/P: Patient was scheduled for B12 injection today-instead we discussed starting oral B12 1000 mcg.  Patient agrees to let me know if he has any recurrence of prior symptoms and if he does we will transition back to injections.  Otherwise we will plan on rechecking B12 at follow-up    Recommended follow up: We discussed 3 to 25-month follow-up or sooner if needed-appears on follow-up section that 1 month  was clicked.  Patient scheduled for 1 month follow-up-please call him and inform him he can space this out 3 to 4 months unless he simply prefers to be seen at 1 month Future Appointments  Date Time Provider [place]  10/30/2019 11:00 AM [facility] - [facility]  10/30/2019 12:00 PM Serafina Mitchell, MD [facility]  11/22/2019  4:20 PM [name], MD [facility]    Lab/Order associations:   [id number]   1. Diastolic dysfunction without heart failure  I51.89   2. Essential hypertension  I10   3. B12 deficiency  E53.8     Meds ordered this encounter  Medications  . cyanocobalamin ((VITAMIN B-12)) injection 1,000 mcg  . Cyanocobalamin 1000 MCG CAPS    Sig: Take 1 capsule by mouth daily.    Dispense:  90 capsule    Refill:  3   Return precautions advised.  [name], MD

## 2019-10-10 NOTE — Assessment & Plan Note (Signed)
S: Patient originally presented with "left arm pumping" random movements in his left arm to the emergency room years ago.  He has been on B12 injections for years and does not remember trying orals at any point. A/P: Patient was scheduled for B12 injection today-instead we discussed starting oral B12 1000 mcg.  Patient agrees to let me know if he has any recurrence of prior symptoms and if he does we will transition back to injections.  Otherwise we will plan on rechecking B12 at follow-up

## 2019-10-27 ENCOUNTER — Telehealth (HOSPITAL_COMMUNITY): Payer: Self-pay

## 2019-10-27 ENCOUNTER — Other Ambulatory Visit: Payer: Self-pay | Admitting: *Deleted

## 2019-10-27 DIAGNOSIS — I6529 Occlusion and stenosis of unspecified carotid artery: Secondary | ICD-10-CM

## 2019-10-27 NOTE — Telephone Encounter (Signed)

## 2019-10-30 ENCOUNTER — Ambulatory Visit (INDEPENDENT_AMBULATORY_CARE_PROVIDER_SITE_OTHER): Payer: Medicare Other | Admitting: Surgery

## 2019-10-30 ENCOUNTER — Encounter: Payer: Self-pay | Admitting: Surgery

## 2019-10-30 ENCOUNTER — Ambulatory Visit (HOSPITAL_COMMUNITY)
Admission: RE | Admit: 2019-10-30 | Discharge: 2019-10-30 | Disposition: A | Discharge status: Home / Self Care | Payer: Medicare Other | Source: Ambulatory Visit | Attending: Surgery | Admitting: Surgery

## 2019-10-30 ENCOUNTER — Other Ambulatory Visit: Payer: Self-pay

## 2019-10-30 VITALS — BP 136/77 | HR 68 | Temp 97.8°F | Resp 20 | Ht 68.0 in | Wt 190.0 lb

## 2019-10-30 DIAGNOSIS — I6523 Occlusion and stenosis of bilateral carotid arteries: Secondary | ICD-10-CM | POA: Diagnosis not present

## 2019-10-30 DIAGNOSIS — I6529 Occlusion and stenosis of unspecified carotid artery: Secondary | ICD-10-CM | POA: Diagnosis not present
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
## 2019-10-31 ENCOUNTER — Encounter: Payer: Self-pay | Admitting: Adult Health

## 2019-10-31 ENCOUNTER — Ambulatory Visit (INDEPENDENT_AMBULATORY_CARE_PROVIDER_SITE_OTHER): Payer: Medicare Other | Admitting: Adult Health

## 2019-10-31 VITALS — BP 150/78 | HR 67 | Ht 68.0 in | Wt 188.4 lb

## 2019-10-31 DIAGNOSIS — I480 Paroxysmal atrial fibrillation: Secondary | ICD-10-CM

## 2019-10-31 DIAGNOSIS — E785 Hyperlipidemia, unspecified: Secondary | ICD-10-CM | POA: Diagnosis not present

## 2019-10-31 DIAGNOSIS — I10 Essential (primary) hypertension: Secondary | ICD-10-CM | POA: Diagnosis not present

## 2019-10-31 DIAGNOSIS — I25119 Atherosclerotic heart disease of native coronary artery with unspecified angina pectoris: Secondary | ICD-10-CM

## 2019-10-31 DIAGNOSIS — I6521 Occlusion and stenosis of right carotid artery: Secondary | ICD-10-CM

## 2019-10-31 DIAGNOSIS — I6523 Occlusion and stenosis of bilateral carotid arteries: Secondary | ICD-10-CM

## 2019-10-31 NOTE — Patient Instructions (Signed)
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
## 2019-11-01 ENCOUNTER — Other Ambulatory Visit: Payer: Self-pay | Admitting: *Deleted

## 2019-11-01 DIAGNOSIS — I6523 Occlusion and stenosis of bilateral carotid arteries: Secondary | ICD-10-CM

## 2019-11-02 ENCOUNTER — Other Ambulatory Visit: Payer: Self-pay

## 2019-11-02 ENCOUNTER — Ambulatory Visit (HOSPITAL_COMMUNITY): Payer: Medicare Other | Attending: Cardiology

## 2019-11-02 DIAGNOSIS — I350 Nonrheumatic aortic (valve) stenosis: Secondary | ICD-10-CM

## 2019-11-02 DIAGNOSIS — I340 Nonrheumatic mitral (valve) insufficiency: Secondary | ICD-10-CM | POA: Diagnosis not present

## 2019-11-02 DIAGNOSIS — I5032 Chronic diastolic (congestive) heart failure: Secondary | ICD-10-CM

## 2019-11-06 DIAGNOSIS — L821 Other seborrheic keratosis: Secondary | ICD-10-CM | POA: Diagnosis not present

## 2019-11-06 DIAGNOSIS — L570 Actinic keratosis: Secondary | ICD-10-CM | POA: Diagnosis not present

## 2019-11-06 DIAGNOSIS — Z85828 Personal history of other malignant neoplasm of skin: Secondary | ICD-10-CM | POA: Diagnosis not present

## 2019-11-06 DIAGNOSIS — R21 Rash and other nonspecific skin eruption: Secondary | ICD-10-CM | POA: Diagnosis not present

## 2019-11-06 DIAGNOSIS — D692 Other nonthrombocytopenic purpura: Secondary | ICD-10-CM | POA: Diagnosis not present

## 2019-11-06 DIAGNOSIS — D2371 Other benign neoplasm of skin of right lower limb, including hip: Secondary | ICD-10-CM | POA: Diagnosis not present

## 2019-11-22 ENCOUNTER — Ambulatory Visit: Payer: Medicare Other | Admitting: Family Medicine

## 2019-11-23 DIAGNOSIS — N189 Chronic kidney disease, unspecified: Secondary | ICD-10-CM | POA: Diagnosis not present

## 2019-11-23 DIAGNOSIS — D631 Anemia in chronic kidney disease: Secondary | ICD-10-CM | POA: Diagnosis not present

## 2019-11-23 DIAGNOSIS — N183 Chronic kidney disease, stage 3 unspecified: Secondary | ICD-10-CM | POA: Diagnosis not present

## 2019-11-23 DIAGNOSIS — Z6828 Body mass index (BMI) 28.0-28.9, adult: Secondary | ICD-10-CM | POA: Diagnosis not present

## 2019-11-25 ENCOUNTER — Other Ambulatory Visit: Payer: Self-pay | Admitting: Cardiology

## 2019-11-25 DIAGNOSIS — I2510 Atherosclerotic heart disease of native coronary artery without angina pectoris: Secondary | ICD-10-CM

## 2019-11-25 DIAGNOSIS — Z9861 Coronary angioplasty status: Secondary | ICD-10-CM

## 2019-11-25 DIAGNOSIS — Z7901 Long term (current) use of anticoagulants: Secondary | ICD-10-CM

## 2019-11-27 ENCOUNTER — Encounter (HOSPITAL_BASED_OUTPATIENT_CLINIC_OR_DEPARTMENT_OTHER): Payer: Self-pay

## 2019-11-27 ENCOUNTER — Inpatient Hospital Stay (HOSPITAL_BASED_OUTPATIENT_CLINIC_OR_DEPARTMENT_OTHER)
Admission: EM | Admit: 2019-11-27 | Discharge: 2019-11-29 | DRG: 307 | Disposition: A | Discharge status: Home / Self Care | Payer: Medicare Other | Attending: Internal Medicine | Admitting: Internal Medicine

## 2019-11-27 ENCOUNTER — Emergency Department (HOSPITAL_BASED_OUTPATIENT_CLINIC_OR_DEPARTMENT_OTHER): Payer: Medicare Other

## 2019-11-27 ENCOUNTER — Other Ambulatory Visit: Payer: Self-pay

## 2019-11-27 ENCOUNTER — Telehealth: Payer: Self-pay | Admitting: Family Medicine

## 2019-11-27 DIAGNOSIS — Z955 Presence of coronary angioplasty implant and graft: Secondary | ICD-10-CM | POA: Diagnosis not present

## 2019-11-27 DIAGNOSIS — I6523 Occlusion and stenosis of bilateral carotid arteries: Secondary | ICD-10-CM | POA: Diagnosis present

## 2019-11-27 DIAGNOSIS — M545 Low back pain: Secondary | ICD-10-CM | POA: Diagnosis not present

## 2019-11-27 DIAGNOSIS — Z96651 Presence of right artificial knee joint: Secondary | ICD-10-CM | POA: Diagnosis present

## 2019-11-27 DIAGNOSIS — Z79899 Other long term (current) drug therapy: Secondary | ICD-10-CM | POA: Diagnosis not present

## 2019-11-27 DIAGNOSIS — R55 Syncope and collapse: Secondary | ICD-10-CM

## 2019-11-27 DIAGNOSIS — I130 Hypertensive heart and chronic kidney disease with heart failure and stage 1 through stage 4 chronic kidney disease, or unspecified chronic kidney disease: Secondary | ICD-10-CM | POA: Diagnosis present

## 2019-11-27 DIAGNOSIS — G8929 Other chronic pain: Secondary | ICD-10-CM | POA: Diagnosis present

## 2019-11-27 DIAGNOSIS — H409 Unspecified glaucoma: Secondary | ICD-10-CM | POA: Diagnosis present

## 2019-11-27 DIAGNOSIS — Z87891 Personal history of nicotine dependence: Secondary | ICD-10-CM | POA: Diagnosis not present

## 2019-11-27 DIAGNOSIS — Z9841 Cataract extraction status, right eye: Secondary | ICD-10-CM

## 2019-11-27 DIAGNOSIS — R600 Localized edema: Secondary | ICD-10-CM | POA: Diagnosis not present

## 2019-11-27 DIAGNOSIS — I2510 Atherosclerotic heart disease of native coronary artery without angina pectoris: Secondary | ICD-10-CM | POA: Diagnosis present

## 2019-11-27 DIAGNOSIS — I350 Nonrheumatic aortic (valve) stenosis: Secondary | ICD-10-CM | POA: Diagnosis not present

## 2019-11-27 DIAGNOSIS — I25118 Atherosclerotic heart disease of native coronary artery with other forms of angina pectoris: Secondary | ICD-10-CM | POA: Diagnosis present

## 2019-11-27 DIAGNOSIS — Z6826 Body mass index (BMI) 26.0-26.9, adult: Secondary | ICD-10-CM | POA: Diagnosis not present

## 2019-11-27 DIAGNOSIS — I252 Old myocardial infarction: Secondary | ICD-10-CM | POA: Diagnosis not present

## 2019-11-27 DIAGNOSIS — G609 Hereditary and idiopathic neuropathy, unspecified: Secondary | ICD-10-CM | POA: Diagnosis present

## 2019-11-27 DIAGNOSIS — I25119 Atherosclerotic heart disease of native coronary artery with unspecified angina pectoris: Secondary | ICD-10-CM | POA: Diagnosis not present

## 2019-11-27 DIAGNOSIS — N179 Acute kidney failure, unspecified: Secondary | ICD-10-CM | POA: Diagnosis present

## 2019-11-27 DIAGNOSIS — Z7901 Long term (current) use of anticoagulants: Secondary | ICD-10-CM

## 2019-11-27 DIAGNOSIS — G4730 Sleep apnea, unspecified: Secondary | ICD-10-CM | POA: Diagnosis present

## 2019-11-27 DIAGNOSIS — Z01818 Encounter for other preprocedural examination: Secondary | ICD-10-CM | POA: Diagnosis not present

## 2019-11-27 DIAGNOSIS — Z85828 Personal history of other malignant neoplasm of skin: Secondary | ICD-10-CM | POA: Diagnosis not present

## 2019-11-27 DIAGNOSIS — K529 Noninfective gastroenteritis and colitis, unspecified: Secondary | ICD-10-CM | POA: Diagnosis not present

## 2019-11-27 DIAGNOSIS — E785 Hyperlipidemia, unspecified: Secondary | ICD-10-CM | POA: Diagnosis present

## 2019-11-27 DIAGNOSIS — I10 Essential (primary) hypertension: Secondary | ICD-10-CM | POA: Diagnosis present

## 2019-11-27 DIAGNOSIS — N184 Chronic kidney disease, stage 4 (severe): Secondary | ICD-10-CM | POA: Diagnosis present

## 2019-11-27 DIAGNOSIS — Z7902 Long term (current) use of antithrombotics/antiplatelets: Secondary | ICD-10-CM | POA: Diagnosis not present

## 2019-11-27 DIAGNOSIS — Z8546 Personal history of malignant neoplasm of prostate: Secondary | ICD-10-CM | POA: Diagnosis not present

## 2019-11-27 DIAGNOSIS — M25561 Pain in right knee: Secondary | ICD-10-CM | POA: Diagnosis not present

## 2019-11-27 DIAGNOSIS — Z20822 Contact with and (suspected) exposure to covid-19: Secondary | ICD-10-CM | POA: Diagnosis present

## 2019-11-27 DIAGNOSIS — E669 Obesity, unspecified: Secondary | ICD-10-CM | POA: Diagnosis present

## 2019-11-27 DIAGNOSIS — Z9842 Cataract extraction status, left eye: Secondary | ICD-10-CM

## 2019-11-27 DIAGNOSIS — Z8041 Family history of malignant neoplasm of ovary: Secondary | ICD-10-CM | POA: Diagnosis not present

## 2019-11-27 DIAGNOSIS — I5032 Chronic diastolic (congestive) heart failure: Secondary | ICD-10-CM | POA: Diagnosis present

## 2019-11-27 DIAGNOSIS — Z87898 Personal history of other specified conditions: Secondary | ICD-10-CM

## 2019-11-27 DIAGNOSIS — Z961 Presence of intraocular lens: Secondary | ICD-10-CM | POA: Diagnosis present

## 2019-11-27 DIAGNOSIS — I480 Paroxysmal atrial fibrillation: Secondary | ICD-10-CM | POA: Diagnosis present

## 2019-11-27 DIAGNOSIS — I779 Disorder of arteries and arterioles, unspecified: Secondary | ICD-10-CM | POA: Diagnosis present

## 2019-11-27 DIAGNOSIS — G4719 Other hypersomnia: Secondary | ICD-10-CM | POA: Diagnosis present

## 2019-11-27 DIAGNOSIS — Z8043 Family history of malignant neoplasm of testis: Secondary | ICD-10-CM

## 2019-11-27 DIAGNOSIS — G4733 Obstructive sleep apnea (adult) (pediatric): Secondary | ICD-10-CM | POA: Diagnosis present

## 2019-11-27 DIAGNOSIS — Z952 Presence of prosthetic heart valve: Secondary | ICD-10-CM | POA: Diagnosis not present

## 2019-11-27 DIAGNOSIS — I5189 Other ill-defined heart diseases: Secondary | ICD-10-CM | POA: Diagnosis not present

## 2019-11-27 DIAGNOSIS — I351 Nonrheumatic aortic (valve) insufficiency: Secondary | ICD-10-CM | POA: Diagnosis not present

## 2019-11-27 LAB — URINALYSIS, ROUTINE W REFLEX MICROSCOPIC
Bilirubin Urine: NEGATIVE
Glucose, UA: NEGATIVE mg/dL
Hgb urine dipstick: NEGATIVE
Ketones, ur: NEGATIVE mg/dL
Leukocytes,Ua: NEGATIVE
Nitrite: NEGATIVE
Protein, ur: NEGATIVE mg/dL
Specific Gravity, Urine: 1.015 (ref 1.005–1.030)
pH: 5.5 (ref 5.0–8.0)

## 2019-11-27 LAB — CBC WITH DIFFERENTIAL/PLATELET
Abs Immature Granulocytes: 0.02 K/uL (ref 0.00–0.07)
Basophils Absolute: 0.1 K/uL (ref 0.0–0.1)
Basophils Relative: 1 %
Eosinophils Absolute: 0.4 K/uL (ref 0.0–0.5)
Eosinophils Relative: 5 %
HCT: 34 % — ABNORMAL LOW (ref 39.0–52.0)
Hemoglobin: 11.1 g/dL — ABNORMAL LOW (ref 13.0–17.0)
Immature Granulocytes: 0 %
Lymphocytes Relative: 16 %
Lymphs Abs: 1.4 K/uL (ref 0.7–4.0)
MCH: 33.5 pg (ref 26.0–34.0)
MCHC: 32.6 g/dL (ref 30.0–36.0)
MCV: 102.7 fL — ABNORMAL HIGH (ref 80.0–100.0)
Monocytes Absolute: 1.1 K/uL — ABNORMAL HIGH (ref 0.1–1.0)
Monocytes Relative: 13 %
Neutro Abs: 5.8 K/uL (ref 1.7–7.7)
Neutrophils Relative %: 65 %
Platelets: 208 K/uL (ref 150–400)
RBC: 3.31 MIL/uL — ABNORMAL LOW (ref 4.22–5.81)
RDW: 13.2 % (ref 11.5–15.5)
WBC: 8.8 K/uL (ref 4.0–10.5)
nRBC: 0 % (ref 0.0–0.2)

## 2019-11-27 LAB — COMPREHENSIVE METABOLIC PANEL WITH GFR
ALT: 12 U/L (ref 0–44)
AST: 21 U/L (ref 15–41)
Albumin: 3.5 g/dL (ref 3.5–5.0)
Alkaline Phosphatase: 53 U/L (ref 38–126)
Anion gap: 12 (ref 5–15)
BUN: 55 mg/dL — ABNORMAL HIGH (ref 8–23)
CO2: 20 mmol/L — ABNORMAL LOW (ref 22–32)
Calcium: 8.3 mg/dL — ABNORMAL LOW (ref 8.9–10.3)
Chloride: 107 mmol/L (ref 98–111)
Creatinine, Ser: 2.81 mg/dL — ABNORMAL HIGH (ref 0.61–1.24)
GFR calc Af Amer: 22 mL/min — ABNORMAL LOW
GFR calc non Af Amer: 19 mL/min — ABNORMAL LOW
Glucose, Bld: 125 mg/dL — ABNORMAL HIGH (ref 70–99)
Potassium: 4 mmol/L (ref 3.5–5.1)
Sodium: 139 mmol/L (ref 135–145)
Total Bilirubin: 0.5 mg/dL (ref 0.3–1.2)
Total Protein: 6.5 g/dL (ref 6.5–8.1)

## 2019-11-27 LAB — BRAIN NATRIURETIC PEPTIDE: B Natriuretic Peptide: 139.3 pg/mL — ABNORMAL HIGH (ref 0.0–100.0)

## 2019-11-27 LAB — TROPONIN I (HIGH SENSITIVITY)
Troponin I (High Sensitivity): 10 ng/L (ref <18)
Troponin I (High Sensitivity): 11 ng/L

## 2019-11-27 MED ORDER — TOPIRAMATE 100 MG PO TABS
100.0000 mg | ORAL_TABLET | Freq: Every day | ORAL | Status: DC
  Administered 2019-11-27 – 2019-11-28 (×2): 100 mg via ORAL
  Filled 2019-11-27 (×3): qty 1

## 2019-11-27 MED ORDER — ISOSORBIDE MONONITRATE ER 60 MG PO TB24
60.0000 mg | ORAL_TABLET | Freq: Every day | ORAL | Status: DC
  Administered 2019-11-28 – 2019-11-29 (×2): 60 mg via ORAL
  Filled 2019-11-27 (×3): qty 1

## 2019-11-27 MED ORDER — ROSUVASTATIN CALCIUM 20 MG PO TABS
20.0000 mg | ORAL_TABLET | Freq: Every day | ORAL | Status: DC
  Administered 2019-11-27 – 2019-11-28 (×2): 20 mg via ORAL
  Filled 2019-11-27 (×2): qty 1

## 2019-11-27 MED ORDER — APIXABAN 2.5 MG PO TABS
2.5000 mg | ORAL_TABLET | Freq: Two times a day (BID) | ORAL | Status: DC
  Administered 2019-11-27: 2.5 mg via ORAL
  Filled 2019-11-27: qty 1

## 2019-11-27 MED ORDER — CLOPIDOGREL BISULFATE 75 MG PO TABS: 75.0000 mg | ORAL_TABLET | Freq: Every day | ORAL | Status: DC

## 2019-11-27 MED ORDER — DILTIAZEM HCL ER COATED BEADS 180 MG PO CP24
180.0000 mg | ORAL_CAPSULE | Freq: Every day | ORAL | Status: DC
  Administered 2019-11-28 – 2019-11-29 (×2): 180 mg via ORAL
  Filled 2019-11-27 (×3): qty 1

## 2019-11-27 MED ORDER — FUROSEMIDE 40 MG PO TABS: 40.0000 mg | ORAL_TABLET | Freq: Every day | ORAL | Status: DC

## 2019-11-27 NOTE — Telephone Encounter (Signed)
Ok to call and let them know needs to go to ED

## 2019-11-27 NOTE — Progress Notes (Addendum)
Transfer from [facility] for exertional syncopal episode. Patient with known aortic stenosis. Per EDP, Cardiology requested admission for repeat Echo and possible LHC at [facility]. Transfer accepted and bed request placed.   Barrington Ellison, MD

## 2019-11-27 NOTE — ED Notes (Signed)
[facility] notified (Jaime) - patient ready for transport 

## 2019-11-27 NOTE — Telephone Encounter (Signed)
Patient daughter called in and wanted to see if Dr. [name] could work her father in. Patient passed out yesterday and fell, stated that he is sore on his back and knee. Triage advised to go to the ED patient wanted to see what Dr. [name] said before going.

## 2019-11-27 NOTE — ED Triage Notes (Signed)
Pt reports having a syncopal episode while doing laundry yesterday PM from a standing position. Pt reports he was "winded" prior to the syncope, but it is normal for him to be winded when he has to climb the stairs in his home.

## 2019-11-27 NOTE — Telephone Encounter (Signed)
If he hit his head definitely needs ER as he is on plavix and eliquis. Syncope can also be a sign of stress on heart or heart attack so more emergent evaluation would be reasonable.

## 2019-11-27 NOTE — ED Provider Notes (Signed)
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 no administration in time range)  diltiazem (CARDIZEM CD) 24 hr capsule 180 mg (has no administration in time range)  apixaban (ELIQUIS) tablet 2.5 mg (2.5 mg Oral Given 11/27/19 2336)  furosemide (LASIX) tablet 40 mg (has no administration in time range)  isosorbide mononitrate (IMDUR) 24 hr tablet 60 mg (has no administration in time range)  rosuvastatin (CRESTOR) tablet 20 mg (20 mg Oral Given by Other 11/27/19 2328)  topiramate (TOPAMAX) tablet 100 mg (100 mg Oral Given by Other 11/27/19 2328)    ED Course  I have reviewed the triage vital signs and the nursing notes.  Pertinent labs & imaging results that were available during my care of the patient were reviewed by me and considered in my medical decision making (see chart for  details).  Clinical Course as of Nov 27 2350  Mon Nov 27, 2019  1741 Spoke with Dr. Gardiner Rhyme, cardiologist. Admit through hospitalist at [facility].    [SJ]  [phone] Spoke with Dr. Marice Potter, hospitalist at [facility]. Agrees to admit the patient.   [SJ]  2025 I am being informed there are no available admission beds at [facility].   [SJ]  2046 Spoke with EDP at [facility]. States they are holding a significant number of patients with a very full waiting room. They can not accommodate transferring this patient to their ED at this time.   [SJ]    Clinical Course User Index [SJ] Ayianna Darnold, Helane Gunther, PA-C   MDM Rules/Calculators/A&P                      Patient presents for evaluation following syncopal episode that occurred yesterday.  Patient is nontoxic appearing, afebrile, not tachycardic, not tachypneic, not hypotensive, maintains excellent SPO2 on room air, and is in no apparent distress.   I have reviewed the patient's chart to obtain more information. I reviewed and interpreted the patient's labs and radiological studies. No acute abnormalities on imaging studies.  No acute abnormalities on EKG. Mild increase in both BUN and creatinine, may be due to dehydration.  Patient's syncope is made more high risk due to moderate to severe aortic stenosis.  Admitted for continued work-up.  Delay in transfer to [facility] due to lack of inpatient beds.  He did eventually get transferred prior to the end of my shift.  Findings and plan of care discussed with Deno Etienne, DO. Dr. Tyrone Nine personally evaluated and examined this patient.  Vitals:   11/27/19 1612 11/27/19 1613 11/27/19 1737 11/27/19 1738  BP: (!) 149/69  (!) 151/73 (!) 151/73  Pulse: 65  64 68  Resp: 20  15 16   Temp: 97.7 F (36.5 C)     TempSrc: Oral     SpO2: 98%  96% 98%  Weight:  79.4 kg    Height:  5\' 8"  (1.727 m)       Final Clinical Impression(s) / ED Diagnoses Final diagnoses:  Syncope and collapse  Aortic valve stenosis,  etiology of cardiac valve disease unspecified    Rx / DC Orders ED Discharge Orders    None       Layla Maw 11/27/19 [place], [place], DO 11/28/19 1604

## 2019-11-27 NOTE — ED Notes (Signed)
Pt. Daughter at bedside will call to have Pt. Night time meds brought to hospital for Pt. Due to Pt. Poss. Being here overnight.

## 2019-11-27 NOTE — Telephone Encounter (Signed)
Called will take to ED

## 2019-11-28 ENCOUNTER — Inpatient Hospital Stay (HOSPITAL_COMMUNITY): Payer: Medicare Other

## 2019-11-28 ENCOUNTER — Encounter (HOSPITAL_COMMUNITY): Payer: Self-pay | Admitting: Family Medicine

## 2019-11-28 DIAGNOSIS — I350 Nonrheumatic aortic (valve) stenosis: Secondary | ICD-10-CM | POA: Diagnosis not present

## 2019-11-28 DIAGNOSIS — N184 Chronic kidney disease, stage 4 (severe): Secondary | ICD-10-CM

## 2019-11-28 DIAGNOSIS — I480 Paroxysmal atrial fibrillation: Secondary | ICD-10-CM

## 2019-11-28 DIAGNOSIS — I351 Nonrheumatic aortic (valve) insufficiency: Secondary | ICD-10-CM

## 2019-11-28 DIAGNOSIS — Z7901 Long term (current) use of anticoagulants: Secondary | ICD-10-CM

## 2019-11-28 DIAGNOSIS — I10 Essential (primary) hypertension: Secondary | ICD-10-CM

## 2019-11-28 DIAGNOSIS — E785 Hyperlipidemia, unspecified: Secondary | ICD-10-CM

## 2019-11-28 DIAGNOSIS — I5189 Other ill-defined heart diseases: Secondary | ICD-10-CM

## 2019-11-28 DIAGNOSIS — R600 Localized edema: Secondary | ICD-10-CM

## 2019-11-28 DIAGNOSIS — R55 Syncope and collapse: Secondary | ICD-10-CM

## 2019-11-28 LAB — ECHOCARDIOGRAM LIMITED
Height: 68 in
Weight: 2800 oz

## 2019-11-28 LAB — CBC WITH DIFFERENTIAL/PLATELET
Abs Immature Granulocytes: 0.04 10*3/uL (ref 0.00–0.07)
Basophils Absolute: 0 10*3/uL (ref 0.0–0.1)
Basophils Relative: 0 %
Eosinophils Absolute: 0.5 10*3/uL (ref 0.0–0.5)
Eosinophils Relative: 5 %
HCT: 32.6 % — ABNORMAL LOW (ref 39.0–52.0)
Hemoglobin: 10.6 g/dL — ABNORMAL LOW (ref 13.0–17.0)
Immature Granulocytes: 0 %
Lymphocytes Relative: 13 %
Lymphs Abs: 1.2 10*3/uL (ref 0.7–4.0)
MCH: 32.9 pg (ref 26.0–34.0)
MCHC: 32.5 g/dL (ref 30.0–36.0)
MCV: 101.2 fL — ABNORMAL HIGH (ref 80.0–100.0)
Monocytes Absolute: 1 10*3/uL (ref 0.1–1.0)
Monocytes Relative: 11 %
Neutro Abs: 6.7 10*3/uL (ref 1.7–7.7)
Neutrophils Relative %: 71 %
Platelets: 204 10*3/uL (ref 150–400)
RBC: 3.22 MIL/uL — ABNORMAL LOW (ref 4.22–5.81)
RDW: 13.2 % (ref 11.5–15.5)
WBC: 9.4 10*3/uL (ref 4.0–10.5)
nRBC: 0 % (ref 0.0–0.2)

## 2019-11-28 LAB — SARS CORONAVIRUS 2 (TAT 6-24 HRS): SARS Coronavirus 2: NEGATIVE

## 2019-11-28 LAB — BASIC METABOLIC PANEL
Anion gap: 9 (ref 5–15)
BUN: 51 mg/dL — ABNORMAL HIGH (ref 8–23)
CO2: 21 mmol/L — ABNORMAL LOW (ref 22–32)
Calcium: 8.5 mg/dL — ABNORMAL LOW (ref 8.9–10.3)
Chloride: 111 mmol/L (ref 98–111)
Creatinine, Ser: 2.73 mg/dL — ABNORMAL HIGH (ref 0.61–1.24)
GFR calc Af Amer: 23 mL/min — ABNORMAL LOW (ref >60)
GFR calc non Af Amer: 20 mL/min — ABNORMAL LOW (ref >60)
Glucose, Bld: 117 mg/dL — ABNORMAL HIGH (ref 70–99)
Potassium: 3.8 mmol/L (ref 3.5–5.1)
Sodium: 141 mmol/L (ref 135–145)

## 2019-11-28 LAB — MAGNESIUM
Magnesium: 2.3 mg/dL (ref 1.7–2.4)
Magnesium: 2.4 mg/dL (ref 1.7–2.4)

## 2019-11-28 LAB — PHOSPHORUS: Phosphorus: 4.4 mg/dL (ref 2.5–4.6)

## 2019-11-28 LAB — MRSA PCR SCREENING: MRSA by PCR: NEGATIVE

## 2019-11-28 MED ORDER — SODIUM CHLORIDE 0.45 % IV SOLN: INTRAVENOUS | Status: DC

## 2019-11-28 MED ORDER — HEPARIN (PORCINE) 25000 UT/250ML-% IV SOLN
1100.0000 [IU]/h | INTRAVENOUS | Status: DC
  Administered 2019-11-28: 1100 [IU]/h via INTRAVENOUS
  Filled 2019-11-28: qty 250
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
## 2019-11-28 NOTE — Consult Note (Addendum)
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 carotid bruit, S1, 5/6 holosystolic murmur, no S2, lungs CTA, no LE edema and warm extremities with palpable pulses.  I have reviewed his echo - it shows LVEF 55-60%, severely thickened and calcified aortic valve with severely restricted leaflets opening and peak/mean gradients 52/29 mmHg, DVI 0.24.   His symptoms are very typical for severe aortic stenosis, possibly paradoxical low flow. His limitation is CKD stage 4, we will for a structural heart consult for consideration of TAVR. He had a recent cath in 04/2019 with patent stents, so he will most probably not need a repeat. He has no anginal symptoms and no evidence for a fluid overload.  If Dr Burt Knack decides to proceed with the TAVR work up, we will arrange for a low dose contrast (CKD TAVR protocol) TAVR scan tomorrow.  We will observe overnight. Follow telemetry for possible  brady/tachyarrhythmias.  Ena Dawley, MD 11/28/2019

## 2019-11-28 NOTE — Consult Note (Signed)
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 alert, oriented, in NAD. Daughter is at bedside. JVP is normal. BL carotid bruits R>L, lungs CTA bilaterally, heart RRR with 3/6 late peaking harsh crescendo decrescendo murmur at the apex and RUSB, diminished A2. Abdomen soft, NT, no masses. Extremities - no edema, skin W/D, no rash.  I have personally reviewed the patient's most recent cardiac catheterization and echo images.  Echocardiogram demonstrates preserved LV systolic function, normal RV function, normal mitral valve function, and severe calcification and restriction of the aortic valve leaflets with reduced leaflet mobility.  Peak transaortic velocity is 3.4 m/s.  Peak and mean transvalvular gradients are 46 and 27 mmHg, respectively.  Dimensionless index of 0.26.  Cardiac catheterization study from April 26, 2019 is reviewed.  This study demonstrates patent coronary arteries and  a left dominant system with continued patency of the stented segment in the proximal LAD.  The patient's hemodynamic assessment showed moderately severe aortic stenosis with a mean transvalvular gradient of 31 mmHg.  The patient now presents with concerning symptoms of progressive exertional dyspnea and exertional syncope, typical of NYHA functional class II limitation. We discussed treatment options at length, and certainly aortic valve replacement is indicated in this symptomatic patient. Echo findings based on doppler criteria are borderline, but his exam and symptoms are highly consistent with severe AS. After discussion of potential treatment options, he would like to pursue further evaluation for TAVR. He will undergo CTA studies of the heart and the chest, abdomen, and pelvis. He will be managed with hydration before and after the CTA in order to prevent contrast nephropathy. He had a cardiac catheterization in September 2020, and considering his lack of angina and CKD 4, I don't think we  should repeat this study.  After his CTA studies, he will be referred for formal cardiac surgical evaluation as part of a multidisciplinary heart valve Jackson approach. As part of today's evaluation, I reviewed potential benefits and risks of TAVR at length with the patient and his daughter.   Sherren Mocha, M.D. 11/29/2019 9:07 AM

## 2019-11-28 NOTE — Plan of Care (Signed)
  Problem: Education: Goal: Knowledge of General Education information will improve Description: Including pain rating scale, medication(s)/side effects and non-pharmacologic comfort measures Outcome: Progressing   Problem: Health Behavior/Discharge Planning: Goal: Ability to manage health-related needs will improve Outcome: Progressing   Problem: Clinical Measurements: Goal: Ability to maintain clinical measurements within normal limits will improve Outcome: Progressing Goal: Will remain free from infection Outcome: Progressing Goal: Respiratory complications will improve Outcome: Progressing Goal: Cardiovascular complication will be avoided Outcome: Progressing   Problem: Nutrition: Goal: Adequate nutrition will be maintained Outcome: Progressing   Problem: Coping: Goal: Level of anxiety will decrease Outcome: Progressing   

## 2019-11-28 NOTE — Plan of Care (Signed)
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
## 2019-11-28 NOTE — Progress Notes (Signed)
  Echocardiogram 2D Echocardiogram has been performed.  Jennette Dubin 11/28/2019, 10:40 AM

## 2019-11-28 NOTE — Progress Notes (Signed)
PROGRESS NOTE    Mark Jackson.  RCV:[id number] DOB: 1929-08-13 DOA: 11/27/2019 PCP: Mark Olp, MD     Brief Narrative:  Mark Jackson. is a 86 y.o. WM PMHx  NSTEMI, CAD, carotid stenosis,  Paroxysmal Atrial Fibrillation on Eliquis, HTN, HLD, moderate aortic stenosis, CKD stage IIIb (baseline Cr ~1.9, obesity (BMI 30-39.9), OSA  Presented to [facility] after exertional syncope and transferred to [facility] for further evaluation by Cardiology.  Patient reports presenting to [facility] yesterday afternoon after episode of syncope. Reports that he went upstairs to do laundry and he always feels a little tired after going upstairs. He transferred a few items to the dryer and still felt weak so he sat down on the stairs and got up a few minutes later to finish the laundry. This second time, reports that he felt like he was going to pass out and then went down on his knees and passed out for unknown amount of time. When he woke up, he called his daughter and was brought to the ED. Reports 3 episodes of diarrhea yesterday otherwise reports feeling well. Last week his Lasix was increased from 20 mg to 40 mg and recently his B12 replacement was changed from IV to PO. Denies headache, dizziness, fever, chills, cough, SOB, chest pain, palpitations, abdominal pain, nausea, vomiting, constipation, dysuria, hematuria, hematochezia, melena, difficulty moving arms/legs, speech difficulty, trouble eating, confusion or any other complaints.  In the outside ED: Mild hypertension otherwise vitals stable on room air. Labs remarkable for: Cr 2.81. CBC WNL. Trop neg x2. BNP 139. CXR: No acute abnormality. Stable minimal chronic bronchitic changes. XR Lumbar Spine: 1. No fracture or subluxation. 2. Multilevel degenerative changes, as described above. Left Knee XR: 1. No fracture or effusion. 2. Extensive chondrocalcinosis. CT Head: 1. No acute intracranial pathology.  2.  Small-vessel white matter disease.  3. Redemonstrated encephalomalacia of the left temporal pole versus arachnoid cyst of the left middle cranial fossa, as seen on prior examination dated 2015.  EDP spoke with Cardiology who recommended transfer to [facility] for Echo and possible LHC.     Subjective: A/O x4, negative CP, negative S OB, negative abdominal pain.  States does not recall how long he was passed out for but could not have been that long.  Had a previous episode many years ago.  States cardiac murmur is chronic.  Was evaluated in the past.   Assessment & Plan:   Principal Problem:   Syncope Active Problems:   Essential hypertension   Sleep apnea   Right-sided carotid artery disease; followed by Dr. Ellyn Hack   Hyperlipidemia with target LDL less than 70   Obesity (BMI 30-39.9)   H/O syncope   Paroxysmal atrial fibrillation (HCC); CHA2DSVasc - 4; Now on Eliquis   Chronic kidney disease (CKD), active medical management without dialysis, stage 4 (severe) (HCC)   Diastolic dysfunction without heart failure   Long term current use of anticoagulant therapy   Moderate aortic stenosis by prior echocardiogram   CAD S/P DES PCI to proximal LAD   Chronic diarrhea   Bilateral lower extremity edema   Syncope and collapse -Status unknown if he was hypotensive prior to episode.  Last week was when his physician increased Lasix to 40 mg daily. -4/20 echocardiogram pending -CT head negative for acute pathology. -Given patient's increase in Lasix may have been dehydrated -0.45% saline 1ml/hr -Orthostatic vitals daily  Chronic Diastolic CHF?/Moderate to Severe aortic stenosis* -4/20 echocardiogram pending -  Strict ins and outs -Daily weight -Cardizem 180 mg daily -Heparin drip -Imdur 60 mg daily -Await cardiology recommendations  Paroxysmal atrial fibrillation -4/20 currently NSR -See CHF  Essential HTN -See CHF  CAD -See CHF  Acute on CKD stage IIIb (baseline  Cr~1.9) Recent Labs  Lab 11/27/19 1648 11/28/19 0323  CREATININE 2.81* 2.73*  -0.45% saline 31ml/hr -Hold all diuretics -Hold all nephrotoxic medication  HLD -Lipid panel pending   DVT prophylaxis: Heparin drip Code Status:  Family Communication: Full Disposition Plan:  1.  Where the patient is from 2.  Anticipated d/c place. 3.  Barriers to d/c     Consultants:  Cardiology   Procedures/Significant Events:  3/25 echocardiogram;Left Ventricle: LVEF=60 to 65%.  -Left ventricular diastolic parameters were normal. Normal left ventricular  filling pressure.  Right Ventricle:  moderately elevated PASP-estimated right ventricular systolic pressure is 70.3 mmHg.  Left Atrium: moderately dilated.  Aortic Valve:  Moderate to severe aortic stenosis ------------------------------------------------------------------------------------------------------------------------- 4/19 CT head W0 contrast; negative acute pathology. -Redemonstrated encephalomalacia of the left temporal pole versus arachnoid cyst of the left middle cranial fossa, as seen on prior examination dated 2015.   I have personally reviewed and interpreted all radiology studies and my findings are as above.  VENTILATOR SETTINGS:    Cultures   Antimicrobials:    Devices    LINES / TUBES:      Continuous Infusions:   Objective: Vitals:   11/28/19 0025 11/28/19 0300 11/28/19 0359 11/28/19 0742  BP: (!) 153/76  140/76 123/72  Pulse: 81 78  72  Resp: 17 13 12 10   Temp: 98.3 F (36.8 C)  (!) 97.4 F (36.3 C) 97.7 F (36.5 C)  TempSrc: Oral  Oral Axillary  SpO2: 100% 99% 97% 95%  Weight:      Height:        Intake/Output Summary (Last 24 hours) at 11/28/2019 5009 Last data filed at 11/28/2019 0230 Gross per 24 hour  Intake --  Output 225 ml  Net -225 ml   Filed Weights   11/27/19 1613  Weight: 79.4 kg    Examination:  General: A/O x4 no acute respiratory distress Eyes: negative  scleral hemorrhage, negative anisocoria, negative icterus ENT: Negative Runny nose, negative gingival bleeding, Neck:  Negative scars, masses, torticollis, lymphadenopathy, JVD Lungs: Clear to auscultation bilaterally without wheezes or crackles Cardiovascular: Regular rate and rhythm positive grade 3/6 systolic murmur, negative gallop or rub normal S1 and S2 Abdomen: negative abdominal pain, nondistended, positive soft, bowel sounds, no rebound, no ascites, no appreciable mass Extremities: No significant cyanosis, clubbing, or edema bilateral lower extremities Skin: Negative rashes, lesions, ulcers Psychiatric:  Negative depression, negative anxiety, negative fatigue, negative mania  Central nervous system:  Cranial nerves II through XII intact, tongue/uvula midline, all extremities muscle strength 5/5, sensation intact throughout, negative dysarthria, negative expressive aphasia, negative receptive aphasia.  .     Data Reviewed: Care during the described time interval was provided by me .  I have reviewed this patient's available data, including medical history, events of note, physical examination, and all test results as part of my evaluation.  CBC: Recent Labs  Lab 11/27/19 1648  WBC 8.8  NEUTROABS 5.8  HGB 11.1*  HCT 34.0*  MCV 102.7*  PLT 381   Basic Metabolic Panel: Recent Labs  Lab 11/27/19 1648 11/28/19 0323  NA 139 141  K 4.0 3.8  CL 107 111  CO2 20* 21*  GLUCOSE 125* 117*  BUN 55* 51*  CREATININE  2.81* 2.73*  CALCIUM 8.3* 8.5*  MG  --  2.3   GFR: Estimated Creatinine Clearance: 18.4 mL/min (A) (by C-G formula based on SCr of 2.73 mg/dL (H)). Liver Function Tests: Recent Labs  Lab 11/27/19 1648  AST 21  ALT 12  ALKPHOS 53  BILITOT 0.5  PROT 6.5  ALBUMIN 3.5   No results for input(s): LIPASE, AMYLASE in the last 168 hours. No results for input(s): AMMONIA in the last 168 hours. Coagulation Profile: No results for input(s): INR, PROTIME in the last  168 hours. Cardiac Enzymes: No results for input(s): CKTOTAL, CKMB, CKMBINDEX, TROPONINI in the last 168 hours. BNP (last 3 results) No results for input(s): PROBNP in the last 8760 hours. HbA1C: No results for input(s): HGBA1C in the last 72 hours. CBG: No results for input(s): GLUCAP in the last 168 hours. Lipid Profile: No results for input(s): CHOL, HDL, LDLCALC, TRIG, CHOLHDL, LDLDIRECT in the last 72 hours. Thyroid Function Tests: No results for input(s): TSH, T4TOTAL, FREET4, T3FREE, THYROIDAB in the last 72 hours. Anemia Panel: No results for input(s): VITAMINB12, FOLATE, FERRITIN, TIBC, IRON, RETICCTPCT in the last 72 hours. Sepsis Labs: No results for input(s): PROCALCITON, LATICACIDVEN in the last 168 hours.  Recent Results (from the past 240 hour(s))  SARS CORONAVIRUS 2 (TAT 6-24 HRS) Nasopharyngeal Nasopharyngeal Swab     Status: None   Collection Time: 11/27/19  6:51 PM   Specimen: Nasopharyngeal Swab  Result Value Ref Range Status   SARS Coronavirus 2 NEGATIVE NEGATIVE Final    Comment: (NOTE) SARS-CoV-2 target nucleic acids are NOT DETECTED. The SARS-CoV-2 RNA is generally detectable in upper and lower respiratory specimens during the acute phase of infection. Negative results do not preclude SARS-CoV-2 infection, do not rule out co-infections with other pathogens, and should not be used as the sole basis for treatment or other patient management decisions. Negative results must be combined with clinical observations, patient history, and epidemiological information. The expected result is Negative. Fact Sheet for Patients: SugarRoll.be Fact Sheet for Healthcare Providers: https://www.Mayra Jolliffe-mathews.com/ This test is not yet approved or cleared by the [place] FDA and  has been authorized for detection and/or diagnosis of SARS-CoV-2 by FDA under an Emergency Use Authorization (EUA). This EUA will remain  in effect  (meaning this test can be used) for the duration of the COVID-19 declaration under Section 56 4(b)(1) of the Act, 21 U.S.C. section 360bbb-3(b)(1), unless the authorization is terminated or revoked sooner. Performed at [facility], [place] [address] [place], [place] [address]   MRSA PCR Screening     Status: None   Collection Time: 11/28/19 12:43 AM   Specimen: Nasal Mucosa; Nasopharyngeal  Result Value Ref Range Status   MRSA by PCR NEGATIVE NEGATIVE Final    Comment:        The GeneXpert MRSA Assay (FDA approved for NASAL specimens only), is one component of a comprehensive MRSA colonization surveillance program. It is not intended to diagnose MRSA infection nor to guide or monitor treatment for MRSA infections. Performed at [facility], [place] [address] [place], [place] [address]          Radiology Studies: DG Chest 2 View  Result Date: 11/27/2019 CLINICAL DATA:  Syncopal episode last night. EXAM: CHEST - 2 VIEW COMPARISON:  03/19/2015 FINDINGS: Normal sized heart. Clear lungs with normal vascularity. Minimal peribronchial thickening without significant change. Mild lower thoracic spine degenerative changes. IMPRESSION: No acute abnormality. Stable minimal chronic bronchitic changes. Electronically  Signed   By: Claudie Revering M.D.   On: 11/27/2019 18:08   DG Lumbar Spine Complete  Result Date: 11/27/2019 CLINICAL DATA:  Low back pain following a syncopal episode last night. EXAM: LUMBAR SPINE - COMPLETE 4+ VIEW COMPARISON:  Abdomen and pelvis CT dated 05/23/2009. FINDINGS: Five non-rib-bearing lumbar vertebrae. Degenerative changes throughout the lumbar and lower thoracic spine with moderate posterior spur formation at the L5-S1 level with progression and mild to moderate posterior spur formation at the L2-3 level with mild progression. Stable mild retrolisthesis at the L2-3 and L3-4 levels. Minimal retrolisthesis at the L1-2 level without significant change. No  fractures, pars defects or acute subluxations. Atheromatous arterial calcifications without visible aneurysm. IMPRESSION: 1. No fracture or subluxation. 2. Multilevel degenerative changes, as described above. Electronically Signed   By: Claudie Revering M.D.   On: 11/27/2019 18:10   CT Head Wo Contrast  Result Date: 11/27/2019 CLINICAL DATA:  Syncope EXAM: CT HEAD WITHOUT CONTRAST TECHNIQUE: Contiguous axial images were obtained from the base of the skull through the vertex without intravenous contrast. COMPARISON:  01/15/2014 FINDINGS: Brain: No evidence of acute infarction, hemorrhage, hydrocephalus, extra-axial collection or mass lesion/mass effect. Mild periventricular and deep white matter hypodensity. Redemonstrated encephalomalacia of the left temporal pole versus arachnoid cyst of the left middle cranial fossa. Vascular: No hyperdense vessel or unexpected calcification. Skull: Normal. Negative for fracture or focal lesion. Sinuses/Orbits: No acute finding. Other: None. IMPRESSION: 1.  No acute intracranial pathology. 2.  Small-vessel white matter disease. 3. Redemonstrated encephalomalacia of the left temporal pole versus arachnoid cyst of the left middle cranial fossa, as seen on prior examination dated 2015. Electronically Signed   By: Eddie Candle M.D.   On: 11/27/2019 17:45   DG Knee Complete 4 Views Left  Result Date: 11/27/2019 CLINICAL DATA:  Diffuse right knee pain following a syncopal episode last night. EXAM: LEFT KNEE - COMPLETE 4+ VIEW COMPARISON:  None. FINDINGS: Extensive medial and lateral meniscal calcifications. Similar appearing calcifications adjacent to the superior aspect of the patella posteriorly. No fracture, dislocation or effusion seen. IMPRESSION: 1. No fracture or effusion. 2. Extensive chondrocalcinosis. Electronically Signed   By: Claudie Revering M.D.   On: 11/27/2019 18:12        Scheduled Meds: . diltiazem  180 mg Oral Daily  . isosorbide mononitrate  60 mg Oral  Daily  . rosuvastatin  20 mg Oral QHS  . topiramate  100 mg Oral QHS   Continuous Infusions:   LOS: 1 day    Time spent:40 min    Laverne Klugh, Geraldo Docker, MD [facility] Pager [phone]  If 7PM-7AM, please contact night-coverage [email or web address] Password [facility] 11/28/2019, 8:32 AM

## 2019-11-29 ENCOUNTER — Inpatient Hospital Stay (HOSPITAL_COMMUNITY): Payer: Medicare Other

## 2019-11-29 ENCOUNTER — Other Ambulatory Visit: Payer: Self-pay

## 2019-11-29 DIAGNOSIS — N184 Chronic kidney disease, stage 4 (severe): Secondary | ICD-10-CM

## 2019-11-29 DIAGNOSIS — I25119 Atherosclerotic heart disease of native coronary artery with unspecified angina pectoris: Secondary | ICD-10-CM

## 2019-11-29 DIAGNOSIS — K529 Noninfective gastroenteritis and colitis, unspecified: Secondary | ICD-10-CM

## 2019-11-29 DIAGNOSIS — I350 Nonrheumatic aortic (valve) stenosis: Secondary | ICD-10-CM

## 2019-11-29 DIAGNOSIS — N289 Disorder of kidney and ureter, unspecified: Secondary | ICD-10-CM

## 2019-11-29 LAB — CBC WITH DIFFERENTIAL/PLATELET
Abs Immature Granulocytes: 0.03 10*3/uL (ref 0.00–0.07)
Basophils Absolute: 0.1 10*3/uL (ref 0.0–0.1)
Basophils Relative: 1 %
Eosinophils Absolute: 0.5 10*3/uL (ref 0.0–0.5)
Eosinophils Relative: 4 %
HCT: 31.2 % — ABNORMAL LOW (ref 39.0–52.0)
Hemoglobin: 10 g/dL — ABNORMAL LOW (ref 13.0–17.0)
Immature Granulocytes: 0 %
Lymphocytes Relative: 17 %
Lymphs Abs: 1.8 10*3/uL (ref 0.7–4.0)
MCH: 32.5 pg (ref 26.0–34.0)
MCHC: 32.1 g/dL (ref 30.0–36.0)
MCV: 101.3 fL — ABNORMAL HIGH (ref 80.0–100.0)
Monocytes Absolute: 1.2 10*3/uL — ABNORMAL HIGH (ref 0.1–1.0)
Monocytes Relative: 11 %
Neutro Abs: 6.9 10*3/uL (ref 1.7–7.7)
Neutrophils Relative %: 67 %
Platelets: 186 10*3/uL (ref 150–400)
RBC: 3.08 MIL/uL — ABNORMAL LOW (ref 4.22–5.81)
RDW: 13.2 % (ref 11.5–15.5)
WBC: 10.4 10*3/uL (ref 4.0–10.5)
nRBC: 0 % (ref 0.0–0.2)

## 2019-11-29 LAB — COMPREHENSIVE METABOLIC PANEL
ALT: 11 U/L (ref 0–44)
AST: 17 U/L (ref 15–41)
Albumin: 2.8 g/dL — ABNORMAL LOW (ref 3.5–5.0)
Alkaline Phosphatase: 45 U/L (ref 38–126)
Anion gap: 11 (ref 5–15)
BUN: 49 mg/dL — ABNORMAL HIGH (ref 8–23)
CO2: 17 mmol/L — ABNORMAL LOW (ref 22–32)
Calcium: 8.1 mg/dL — ABNORMAL LOW (ref 8.9–10.3)
Chloride: 112 mmol/L — ABNORMAL HIGH (ref 98–111)
Creatinine, Ser: 2.43 mg/dL — ABNORMAL HIGH (ref 0.61–1.24)
GFR calc Af Amer: 27 mL/min — ABNORMAL LOW (ref >60)
GFR calc non Af Amer: 23 mL/min — ABNORMAL LOW (ref >60)
Glucose, Bld: 95 mg/dL (ref 70–99)
Potassium: 4.2 mmol/L (ref 3.5–5.1)
Sodium: 140 mmol/L (ref 135–145)
Total Bilirubin: 0.5 mg/dL (ref 0.3–1.2)
Total Protein: 5.4 g/dL — ABNORMAL LOW (ref 6.5–8.1)

## 2019-11-29 LAB — APTT
aPTT: 152 seconds — ABNORMAL HIGH (ref 24–36)
aPTT: 147 seconds — ABNORMAL HIGH (ref 24–36)
aPTT: 140 seconds — ABNORMAL HIGH (ref 24–36)

## 2019-11-29 LAB — LIPID PANEL
Cholesterol: 91 mg/dL (ref 0–200)
HDL: 35 mg/dL — ABNORMAL LOW (ref >40)
LDL Cholesterol: 46 mg/dL (ref 0–99)
Total CHOL/HDL Ratio: 2.6 RATIO
Triglycerides: 49 mg/dL (ref <150)
VLDL: 10 mg/dL (ref 0–40)

## 2019-11-29 LAB — MAGNESIUM: Magnesium: 2.2 mg/dL (ref 1.7–2.4)

## 2019-11-29 LAB — PHOSPHORUS: Phosphorus: 4 mg/dL (ref 2.5–4.6)

## 2019-11-29 LAB — HEPARIN LEVEL (UNFRACTIONATED)
Heparin Unfractionated: 1.28 IU/mL — ABNORMAL HIGH (ref 0.30–0.70)
Heparin Unfractionated: 1.3 IU/mL — ABNORMAL HIGH (ref 0.30–0.70)

## 2019-11-29 MED ORDER — ISOSORBIDE MONONITRATE ER 60 MG PO TB24: 60.0000 mg | ORAL_TABLET | Freq: Every day | ORAL | Status: DC

## 2019-11-29 MED ORDER — HEPARIN (PORCINE) 25000 UT/250ML-% IV SOLN: 600.0000 [IU]/h | INTRAVENOUS | Status: DC

## 2019-11-29 MED ORDER — IOHEXOL 350 MG/ML SOLN
60.0000 mL | Freq: Once | INTRAVENOUS | Status: AC | PRN
  Administered 2019-11-29: 60 mL via INTRAVENOUS

## 2019-11-29 MED ORDER — FUROSEMIDE 40 MG PO TABS: 20.0000 mg | ORAL_TABLET | Freq: Every day | ORAL | Status: DC

## 2019-11-29 MED ORDER — HEPARIN (PORCINE) 25000 UT/250ML-% IV SOLN
900.0000 [IU]/h | INTRAVENOUS | Status: DC
  Administered 2019-11-29: 03:00:00 900 [IU]/h via INTRAVENOUS

## 2019-11-29 MED ORDER — LACTATED RINGERS IV SOLN: INTRAVENOUS | Status: DC

## 2019-11-29 NOTE — Progress Notes (Signed)
[place] for Heparin Indication: atrial fibrillation  Assessment:  86 y.o. male with a hx of CAD s/p overlapping DES to ostial and proximal LAD, paroxysmal atrial fibrillation on Eliquis for anticoagulation, moderate aortic stenosis, hypertension, chronic kidney disease stage III/IV and carotid artery disease who is being  evaluated of exertional syncope.  Pharmacy is being consulted for heparin dosing for atrial fibrillation.  Patient was on Eliquis 2.5 mg po bid - last dose 4/19 pm. Creatinine clearance 18.4 ml/min.  Initial APTT elevated 152 sec  Goal of Therapy:  Heparin level 0.3-0.7 units/ml  APTT 66 - 102 secs Monitor platelets by anticoagulation protocol: Yes   Plan:  Hold heparin for 1 hr then restart at 900 units/hr aPTT in 6-8 hours  Continue to monitor H&H and platelets  Thanks for allowing pharmacy to be a part of this patient's care.  Excell Seltzer, PharmD Clinical Pharmacist 11/29/2019, 2:08 AM
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
## 2019-11-29 NOTE — Plan of Care (Signed)

## 2019-11-29 NOTE — Discharge Summary (Signed)
Physician Discharge Summary  Mark Jackson. LYY:[id number] DOB: 11-04-1929 DOA: 11/27/2019  PCP: [name], MD  Admit date: 11/27/2019 Discharge date: 11/29/2019  Time spent: 35 minutes  Recommendations for Outpatient Follow-up:  1. Repeat basic metabolic panel to evaluate lites and renal function 2. Reassess blood pressure/volume status and further adjust diuretics and antihypertensive regimen as needed.   Discharge Diagnoses:  Principal Problem:   Syncope and collapse Active Problems:   Essential hypertension   Sleep apnea   Right-sided carotid artery disease; followed by Dr. Ellyn Hack   Hyperlipidemia with target LDL less than 70   Obesity (BMI 30-39.9)   H/O syncope   Paroxysmal atrial fibrillation (HCC); CHA2DSVasc - 4; Now on Eliquis   Chronic kidney disease (CKD), active medical management without dialysis, stage 4 (severe) (HCC)   Diastolic dysfunction without heart failure   Long term current use of anticoagulant therapy   Aortic valve stenosis   CAD S/P DES PCI to proximal LAD   Chronic diarrhea   Bilateral lower extremity edema   Discharge Condition: Stable and improved.  Discharged home with instruction to follow-up with PCP, nephrology and cardiology service as an outpatient.  CODE STATUS: Full code  Diet recommendation: Heart healthy/low-sodium diet.  Filed Weights   11/27/19 1613 11/29/19 0701  Weight: 79.4 kg 81.2 kg    History of present illness:  Mark Jacksonis a 86 y.o.WM PMHx NSTEMI, CAD, carotid stenosis, Paroxysmal Atrial Fibrillation on Eliquis, HTN,HLD, moderate aortic stenosis, CKD stage IIIb (baseline Cr ~1.9, obesity (BMI 30-39.9),OSA  Presented to [facility] after exertional syncope and transferred to [facility] for further evaluation by Cardiology.  Patient reports presenting to [facility] yesterday afternoon after episode of syncope. Reports that he went upstairs to do laundry and he always feels a little  tired after going upstairs. He transferred a few items to the dryer and still felt weak so he sat down on the stairs and got up a few minutes later to finish the laundry. This second time, reports that he felt like he was going to pass out and then went down on his knees and passed out for unknown amount of time. When he woke up, he called his daughter and was brought to the ED. Reports 3 episodes of diarrhea yesterday otherwise reports feeling well. Last week his Lasix was increased from 20 mg to 40 mg and recently his B12 replacement was changed from IV to PO.Denies headache, dizziness, fever, chills, cough, SOB, chest pain,palpitations,abdominal pain, nausea, vomiting, constipation, dysuria, hematuria, hematochezia, melena, difficulty moving arms/legs, speech difficulty, trouble eating, confusion or any other complaints.  In theoutsideED: Mild hypertension otherwise vitals stable on room air. Labs remarkable for: Cr 2.81. CBC WNL. Trop neg x2. BNP 139. CXR:No acute abnormality. Stable minimal chronic bronchitic changes. XR Lumbar Spine:1. No fracture or subluxation. 2. Multilevel degenerative changes, as described above. Left Knee XR: 1. No fracture or effusion. 2. Extensive chondrocalcinosis. CT Head: 1. No acute intracranial pathology.  2. Small-vessel white matter disease.  3. Redemonstrated encephalomalacia of the left temporal pole versus arachnoid cyst of the left middle cranial fossa, as seen on prior examination dated 2015.  EDP spoke with Cardiology who recommended transfer to [facility] for further evaluation and management.   Hospital Course:  Syncope and collapse -Status unknown if he was hypotensive prior to episode.  Last week was nephrologist increase Lasix to 40 mg daily. -Most likely symptomatic in the setting of aortic stenosis. -CT head negative  for acute pathology. -Euvolemic currently and denying lightheadedness or any further syncopal events. -Lasix dose has  been adjusted at discharge -Patient will follow up with cardiology for further evaluation and management including TAVR for his aortic stenosis.  Chronic Diastolic CHF?/Moderate to Severe aortic stenosis* -4/20 echocardiogram demonstrating preserved ejection fraction and diastolic dysfunction. -Continue daily weights and low-sodium diet. -Lasix dose adjusted to 20 mg daily -Patient continue outpatient follow-up with cardiology service for TAVR  Paroxysmal atrial fibrillation -4/20 currently NSR -Resume Eliquis for secondary prevention. -Continue Cardizem.  Essential HTN -Stable and well controlled.  CAD -No chest pain -Recent heart cath demonstrating patency patency of stents -Continue medical management  And continue the use of statins and Plavix  Acute on CKD stage IIIb-4 (transitional stages)  -Cr 2.43 at discharge -Patient advised to maintain adequate hydration -Continue to minimize/avoid nephrotoxic agents -Diuretics (Lasix ) resume at a lower dose. -Patient will continue to follow low-sodium diet and checking his weight on daily basis. -Continue outpatient follow-up with nephrology service.  HLD -Continue statins -Healthy diet encourage    Procedures: 3/25 echocardiogram;Left Ventricle: LVEF=60 to 65%.  -Left ventricular diastolic parameters were normal. Normal left ventricular  filling pressure.  Right Ventricle:  moderately elevated PASP-estimated right ventricular systolic pressure is 98.3 mmHg.  Left Atrium: moderately dilated.  Aortic Valve:  Moderate to severe aortic stenosis ------------------------------------------------------------------------------------------------------------------------- 4/19 CT head W0 contrast; negative acute pathology. -Redemonstrated encephalomalacia of the left temporal pole versus arachnoid cyst of the left middle cranial fossa, as seen on prior examination dated 2015.  4/21 TAVR scans (refer to images and cardiology  interpretation for reports)  Consultations:  Cardiology service  Discharge Exam: Vitals:   11/29/19 1146 11/29/19 1550  BP: 117/77 136/83  Pulse: 68 67  Resp: 10 14  Temp: 97.6 F (36.4 C) 97.8 F (36.6 C)  SpO2: 98% 100%    General: No chest pain, no palpitations, no shortness of breath, no nausea or vomiting.  Patient denies lightheadedness or any further episode of syncope while inpatient. Cardiovascular: Positive systolic murmur, no JVD, no rubs or gallops. Respiratory: Clear to auscultation bilaterally; normal respiratory effort.  Good oxygen saturation on room air. Abdomen: Soft, nontender, nondistended, positive bowel sounds Extremities: No cyanosis or clubbing; trace pedal edema bilaterally appreciated.  Discharge Instructions   Discharge Instructions    (HEART FAILURE PATIENTS) Call MD:  Anytime you have any of the following symptoms: 1) 3 pound weight gain in 24 hours or 5 pounds in 1 week 2) shortness of breath, with or without a dry hacking cough 3) swelling in the hands, feet or stomach 4) if you have to sleep on extra pillows at night in order to breathe.   Complete by: As directed    Diet - low sodium heart healthy   Complete by: As directed    Discharge instructions   Complete by: As directed    Maintain adequate hydration Take medications as prescribed Arrange follow-up with PCP in 1 week Follow-up with nephrology service as previously instructed Follow-up with cardiology service as arranged. Check daily weights and follow low-sodium diet.   Increase activity slowly   Complete by: As directed      Allergies as of 11/29/2019   No Known Allergies     Medication List    TAKE these medications   acetaminophen 325 MG tablet Commonly known as: TYLENOL Take 325-650 mg by mouth every 6 (six) hours as needed for moderate pain or headache.   clopidogrel 75 MG tablet  Commonly known as: PLAVIX TAKE 1 TABLET EVERY DAY WITH BREAKFAST. What changed: See the  new instructions.   diltiazem 180 MG 24 hr capsule Commonly known as: CARDIZEM CD Take 1 capsule (180 mg total) by mouth daily.   Eliquis 2.5 MG Tabs tablet Generic drug: apixaban TAKE 1 TABLET BY MOUTH TWICE DAILY. What changed: how much to take   ferrous sulfate 325 (65 FE) MG tablet Take 325 mg by mouth daily with breakfast.   furosemide 40 MG tablet Commonly known as: LASIX Take 0.5 tablets (20 mg total) by mouth daily. What changed:   how much to take  Another medication with the same name was removed. Continue taking this medication, and follow the directions you see here.   isosorbide mononitrate 60 MG 24 hr tablet Commonly known as: IMDUR Take 1 tablet (60 mg total) by mouth daily.   loperamide 2 MG tablet Commonly known as: IMODIUM A-D Take 2 mg by mouth 4 (four) times daily as needed for diarrhea or loose stools.   MULTIVITAMIN PO Take 1 tablet by mouth daily.   nitroGLYCERIN 0.4 MG SL tablet Commonly known as: NITROSTAT ONE TABLET UNDER TONGUE WHEN NEEDED FOR CHEST PAIN. MAY REPEAT IN 5 MINUTES. What changed: See the new instructions.   rosuvastatin 20 MG tablet Commonly known as: CRESTOR Take 1 tablet (20 mg total) by mouth at bedtime. What changed: Another medication with the same name was removed. Continue taking this medication, and follow the directions you see here.   topiramate 200 MG tablet Commonly known as: TOPAMAX TAKE 1 TABLET ONCE DAILY. What changed: Another medication with the same name was removed. Continue taking this medication, and follow the directions you see here.   vitamin B-12 1000 MCG tablet Commonly known as: CYANOCOBALAMIN Take 1,000 mcg by mouth daily.      No Known Allergies Follow-up Information    [name], MD. Schedule an appointment as soon as possible for a visit in 10 day(s).   Specialty: Family Medicine Contact information: [place] [address] [phone]        [name], MD .   Specialty: Cardiology Contact information: [address] [place] [address] [phone]           The results of significant diagnostics from this hospitalization (including imaging, microbiology, ancillary and laboratory) are listed below for reference.    Significant Diagnostic Studies: DG Chest 2 View  Result Date: 11/27/2019 CLINICAL DATA:  Syncopal episode last night. EXAM: CHEST - 2 VIEW COMPARISON:  03/19/2015 FINDINGS: Normal sized heart. Clear lungs with normal vascularity. Minimal peribronchial thickening without significant change. Mild lower thoracic spine degenerative changes. IMPRESSION: No acute abnormality. Stable minimal chronic bronchitic changes. Electronically Signed   By: Claudie Revering M.D.   On: 11/27/2019 18:08   DG Lumbar Spine Complete  Result Date: 11/27/2019 CLINICAL DATA:  Low back pain following a syncopal episode last night. EXAM: LUMBAR SPINE - COMPLETE 4+ VIEW COMPARISON:  Abdomen and pelvis CT dated 05/23/2009. FINDINGS: Five non-rib-bearing lumbar vertebrae. Degenerative changes throughout the lumbar and lower thoracic spine with moderate posterior spur formation at the L5-S1 level with progression and mild to moderate posterior spur formation at the L2-3 level with mild progression. Stable mild retrolisthesis at the L2-3 and L3-4 levels. Minimal retrolisthesis at the L1-2 level without significant change. No fractures, pars defects or acute subluxations. Atheromatous arterial calcifications without visible aneurysm. IMPRESSION: 1. No fracture or subluxation. 2.  Multilevel degenerative changes, as described above. Electronically Signed   By: Claudie Revering M.D.   On: 11/27/2019 18:10   CT Head Wo Contrast  Result Date: 11/27/2019 CLINICAL DATA:  Syncope EXAM: CT HEAD WITHOUT CONTRAST TECHNIQUE: Contiguous axial images were obtained from the base of the skull through the vertex without intravenous contrast. COMPARISON:   01/15/2014 FINDINGS: Brain: No evidence of acute infarction, hemorrhage, hydrocephalus, extra-axial collection or mass lesion/mass effect. Mild periventricular and deep white matter hypodensity. Redemonstrated encephalomalacia of the left temporal pole versus arachnoid cyst of the left middle cranial fossa. Vascular: No hyperdense vessel or unexpected calcification. Skull: Normal. Negative for fracture or focal lesion. Sinuses/Orbits: No acute finding. Other: None. IMPRESSION: 1.  No acute intracranial pathology. 2.  Small-vessel white matter disease. 3. Redemonstrated encephalomalacia of the left temporal pole versus arachnoid cyst of the left middle cranial fossa, as seen on prior examination dated 2015. Electronically Signed   By: Eddie Candle M.D.   On: 11/27/2019 17:45   CT CORONARY MORPH W/CTA COR W/SCORE W/CA W/CM &/OR WO/CM  Result Date: 11/29/2019 EXAM: OVER-READ INTERPRETATION  CT CHEST The following report is an over-read performed by radiologist Dr. Vinnie Langton of [facility], [place] on 11/29/2019. This over-read does not include interpretation of cardiac or coronary anatomy or pathology. The coronary calcium score/coronary CTA interpretation by the cardiologist is attached. COMPARISON:  None. FINDINGS: Extracardiac findings will be described separately under dictation for contemporaneously obtained CTA chest, abdomen and pelvis dated 11/29/2019. IMPRESSION: Please see separate dictation for contemporaneously obtained CTA chest, abdomen and pelvis 11/29/2019 for full description of relevant extracardiac findings. Electronically Signed   By: Vinnie Langton M.D.   On: 11/29/2019 16:13   DG Knee Complete 4 Views Left  Result Date: 11/27/2019 CLINICAL DATA:  Diffuse right knee pain following a syncopal episode last night. EXAM: LEFT KNEE - COMPLETE 4+ VIEW COMPARISON:  None. FINDINGS: Extensive medial and lateral meniscal calcifications. Similar appearing calcifications adjacent to the  superior aspect of the patella posteriorly. No fracture, dislocation or effusion seen. IMPRESSION: 1. No fracture or effusion. 2. Extensive chondrocalcinosis. Electronically Signed   By: Claudie Revering M.D.   On: 11/27/2019 18:12   ECHOCARDIOGRAM COMPLETE  Result Date: 11/03/2019    ECHOCARDIOGRAM REPORT   Patient Name:   Chelsea Nusz. Date of Exam: 11/02/2019 Medical Rec #:  [id number]          Height:       68.0 in Accession #:    [id number]         Weight:       188.4 lb Date of Birth:  11-Dec-1929         BSA:          1.992 m Patient Age:    25 years           BP:           154/86 mmHg Patient Gender: M                  HR:           67 bpm. Exam Location:  [facility] Procedure: 2D Echo, Cardiac Doppler and Color Doppler Indications:    I05.9 Mitral Valve disorder  History:        Patient has prior history of Echocardiogram examinations, most                 recent 03/07/2019. CAD and Previous Myocardial Infarction,  Arrythmias:Atrial Fibrillation, Signs/Symptoms:Syncope; Risk                 Factors:Hypertension and Dyslipidemia. Anemia. Chronic low back                 pain. Chronic kidney disease.  Sonographer:    Diamond Nickel RCS Referring Phys: [place]  1. Left ventricular ejection fraction, by estimation, is 60 to 65%. The left ventricle has normal function. The left ventricle has no regional wall motion abnormalities. There is mild left ventricular hypertrophy of the basal-septal segment. Left ventricular diastolic parameters were normal.  2. Right ventricular systolic function is normal. The right ventricular size is normal. There is moderately elevated pulmonary artery systolic pressure.  3. Left atrial size was mild to moderately dilated.  4. The mitral valve is degenerative. Mild mitral valve regurgitation. No evidence of mitral stenosis.  5. The aortic valve is tricuspid. There is severe thickening of the aortic valve. There is severe calcifcation of  the aortic valve. Aortic valve mean gradient measures 22.0 mmHg. Aortic valve peak gradient measures 36.0 mmHg. DI is 0.26 and AVA by VTI is 0.92cm2. Findings consistent with moderate to severe aortic stenosis. Aortic valve regurgitation is mild. Aortic regurgitation PHT measures 610 msec.  6. The inferior vena cava is normal in size with greater than 50% respiratory variability, suggesting right atrial pressure of 3 mmHg.  7. Compared to the echo of 05/08/2019, the dimensionless index has decreased from 0.32 to 0.26, transaortic peak and mean gradients have increased from 30/55mmHg to 36/23mmHg. FINDINGS  Left Ventricle: Left ventricular ejection fraction, by estimation, is 60 to 65%. The left ventricle has normal function. The left ventricle has no regional wall motion abnormalities. The left ventricular internal cavity size was normal in size. There is  mild left ventricular hypertrophy of the basal-septal segment. Left ventricular diastolic parameters were normal. Normal left ventricular filling pressure. Right Ventricle: The right ventricular size is normal. No increase in right ventricular wall thickness. Right ventricular systolic function is normal. There is moderately elevated pulmonary artery systolic pressure. The tricuspid regurgitant velocity is 2.76 m/s, and with an assumed right atrial pressure of 10 mmHg, the estimated right ventricular systolic pressure is 55.7 mmHg. Left Atrium: Left atrial size was mild to moderately dilated. Right Atrium: Right atrial size was normal in size. Pericardium: There is no evidence of pericardial effusion. Mitral Valve: The mitral valve is degenerative in appearance. There is mild calcification of the anterior mitral valve leaflet(s). Normal mobility of the mitral valve leaflets. Moderate mitral annular calcification. Mild mitral valve regurgitation. No evidence of mitral valve stenosis. Tricuspid Valve: The tricuspid valve is normal in structure. Tricuspid valve  regurgitation is trivial. No evidence of tricuspid stenosis. Aortic Valve: The aortic valve is tricuspid. Aortic valve regurgitation is mild. Aortic regurgitation PHT measures 610 msec. Moderate to severe aortic stenosis is present. There is severe thickening of the aortic valve. There is severe calcifcation of the aortic valve. Aortic valve mean gradient measures 22.0 mmHg. Aortic valve peak gradient measures 36.0 mmHg. Aortic valve area, by VTI measures 0.92 cm. Pulmonic Valve: The pulmonic valve was normal in structure. Pulmonic valve regurgitation is not visualized. No evidence of pulmonic stenosis. Aorta: The aortic root is normal in size and structure. Venous: The inferior vena cava is normal in size with greater than 50% respiratory variability, suggesting right atrial pressure of 3 mmHg. IAS/Shunts: No atrial level shunt detected by color flow Doppler.  LEFT  VENTRICLE PLAX 2D LVIDd:         4.70 cm  Diastology LVIDs:         3.10 cm  LV e' lateral:   10.00 cm/s LV PW:         1.00 cm  LV E/e' lateral: 8.9 LV IVS:        1.20 cm  LV e' medial:    8.27 cm/s LVOT diam:     2.10 cm  LV E/e' medial:  10.8 LV SV:         73 LV SV Index:   37 LVOT Area:     3.46 cm  RIGHT VENTRICLE RV Basal diam:  1.90 cm RV S prime:     10.40 cm/s TAPSE (M-mode): 1.7 cm RVSP:           33.5 mmHg LEFT ATRIUM             Index       RIGHT ATRIUM           Index LA diam:        3.80 cm 1.91 cm/m  RA Pressure: 3.00 mmHg LA Vol (A2C):   86.3 ml 43.31 ml/m RA Area:     14.50 cm LA Vol (A4C):   66.0 ml 33.12 ml/m RA Volume:   28.50 ml  14.30 ml/m LA Biplane Vol: 76.4 ml 38.34 ml/m  AORTIC VALVE AV Area (Vmax):    0.96 cm AV Area (Vmean):   0.79 cm AV Area (VTI):     0.92 cm AV Vmax:           300.00 cm/s AV Vmean:          221.000 cm/s AV VTI:            0.798 m AV Peak Grad:      36.0 mmHg AV Mean Grad:      22.0 mmHg LVOT Vmax:         82.80 cm/s LVOT Vmean:        50.700 cm/s LVOT VTI:          0.211 m LVOT/AV VTI ratio:  0.26 AI PHT:            610 msec  AORTA Ao Root diam: 3.30 cm MITRAL VALVE                TRICUSPID VALVE MV Area (PHT): 2.07 cm     TR Peak grad:   30.5 mmHg MV Decel Time: 366 msec     TR Vmax:        276.00 cm/s MR Peak grad: 132.2 mmHg    Estimated RAP:  3.00 mmHg MR Mean grad: 83.0 mmHg     RVSP:           33.5 mmHg MR Vmax:      575.00 cm/s MR Vmean:     426.0 cm/s    SHUNTS MV E velocity: 89.20 cm/s   Systemic VTI:  0.21 m MV A velocity: 110.00 cm/s  Systemic Diam: 2.10 cm MV E/A ratio:  0.81 Fransico Him MD Electronically signed by Fransico Him MD Signature Date/Time: 11/03/2019/3:34:50 PM    Final    ECHOCARDIOGRAM LIMITED  Result Date: 11/28/2019    ECHOCARDIOGRAM REPORT   Patient Name:   Mohamedamin Nifong. Date of Exam: 11/28/2019 Medical Rec #:  [id number]          Height:       68.0 in Accession #:  [id number]         Weight:       175.0 lb Date of Birth:  01-02-1930         BSA:          1.931 m Patient Age:    3 years           BP:           123/72 mmHg Patient Gender: M                  HR:           72 bpm. Exam Location:  Inpatient Procedure: Limited Echo, Limited Color Doppler and Cardiac Doppler Indications:    Syncope R55  History:        Patient has prior history of Echocardiogram examinations, most                 recent 01/03/2020. Aortic Valve Disease.  Sonographer:    Mikki Santee RDCS (AE) Referring Phys: [id number] [place]  1. Normal LV systolic function; grade 1 diastolic dysfunction; aortic valve not well visualized; moderate AS (mean gradient 27 mmHg) and mild AI.  2. Left ventricular ejection fraction, by estimation, is 60 to 65%. The left ventricle has normal function. The left ventricle has no regional wall motion abnormalities. Left ventricular diastolic parameters are consistent with Grade I diastolic dysfunction (impaired relaxation).  3. Right ventricular systolic function is normal. The right ventricular size is normal.  4. The mitral valve is normal  in structure. No evidence of mitral valve regurgitation. No evidence of mitral stenosis.  5. The aortic valve was not well visualized. Aortic valve regurgitation is mild. Moderate aortic valve stenosis. FINDINGS  Left Ventricle: Left ventricular ejection fraction, by estimation, is 60 to 65%. The left ventricle has normal function. The left ventricle has no regional wall motion abnormalities. The left ventricular internal cavity size was normal in size. There is  no left ventricular hypertrophy. Left ventricular diastolic parameters are consistent with Grade I diastolic dysfunction (impaired relaxation). Right Ventricle: The right ventricular size is normal. Right ventricular systolic function is normal. Left Atrium: Left atrial size was normal in size. Right Atrium: Right atrial size was normal in size. Pericardium: There is no evidence of pericardial effusion. Mitral Valve: The mitral valve is normal in structure. Normal mobility of the mitral valve leaflets. No evidence of mitral valve regurgitation. No evidence of mitral valve stenosis. Tricuspid Valve: The tricuspid valve is normal in structure. Tricuspid valve regurgitation is not demonstrated. No evidence of tricuspid stenosis. Aortic Valve: The aortic valve was not well visualized. Aortic valve regurgitation is mild. Aortic regurgitation PHT measures 387 msec. Moderate aortic stenosis is present. Aortic valve mean gradient measures 26.5 mmHg. Aortic valve peak gradient measures 45.8 mmHg. Aortic valve area, by VTI measures 1.09 cm. Pulmonic Valve: The pulmonic valve was not well visualized. Pulmonic valve regurgitation is not visualized. No evidence of pulmonic stenosis. Aorta: The aortic root is normal in size and structure. Venous: The inferior vena cava was not well visualized.  Additional Comments: Normal LV systolic function; grade 1 diastolic dysfunction; aortic valve not well visualized; moderate AS (mean gradient 27 mmHg) and mild AI.  LEFT  VENTRICLE PLAX 2D LVIDd:         4.90 cm  Diastology LVIDs:         3.90 cm  LV e' lateral:   7.83 cm/s LV PW:  0.90 cm  LV E/e' lateral: 9.4 LV IVS:        1.00 cm  LV e' medial:    7.40 cm/s LVOT diam:     2.30 cm  LV E/e' medial:  9.9 LV SV:         95 LV SV Index:   49 LVOT Area:     4.15 cm  LEFT ATRIUM             Index LA diam:        3.50 cm 1.81 cm/m LA Vol (A2C):   57.3 ml 29.67 ml/m LA Vol (A4C):   59.0 ml 30.55 ml/m LA Biplane Vol: 60.7 ml 31.43 ml/m  AORTIC VALVE AV Area (Vmax):    1.04 cm AV Area (Vmean):   1.03 cm AV Area (VTI):     1.09 cm AV Vmax:           338.25 cm/s AV Vmean:          242.750 cm/s AV VTI:            0.872 m AV Peak Grad:      45.8 mmHg AV Mean Grad:      26.5 mmHg LVOT Vmax:         85.00 cm/s LVOT Vmean:        59.900 cm/s LVOT VTI:          0.229 m LVOT/AV VTI ratio: 0.26 AI PHT:            387 msec  AORTA Ao Root diam: 3.10 cm MITRAL VALVE                TRICUSPID VALVE MV Area (PHT): 2.48 cm     TR Peak grad:   19.7 mmHg MV Decel Time: 306 msec     TR Vmax:        222.00 cm/s MV E velocity: 73.50 cm/s MV A velocity: 131.00 cm/s  SHUNTS MV E/A ratio:  0.56         Systemic VTI:  0.23 m                             Systemic Diam: 2.30 cm Kirk Ruths MD Electronically signed by Kirk Ruths MD Signature Date/Time: 11/28/2019/12:49:56 PM    Final     Microbiology: Recent Results (from the past 240 hour(s))  SARS CORONAVIRUS 2 (TAT 6-24 HRS) Nasopharyngeal Nasopharyngeal Swab     Status: None   Collection Time: 11/27/19  6:51 PM   Specimen: Nasopharyngeal Swab  Result Value Ref Range Status   SARS Coronavirus 2 NEGATIVE NEGATIVE Final    Comment: (NOTE) SARS-CoV-2 target nucleic acids are NOT DETECTED. The SARS-CoV-2 RNA is generally detectable in upper and lower respiratory specimens during the acute phase of infection. Negative results do not preclude SARS-CoV-2 infection, do not rule out co-infections with other pathogens, and should not be  used as the sole basis for treatment or other patient management decisions. Negative results must be combined with clinical observations, patient history, and epidemiological information. The expected result is Negative. Fact Sheet for Patients: SugarRoll.be Fact Sheet for Healthcare Providers: https://www.woods-mathews.com/ This test is not yet approved or cleared by the [place] FDA and  has been authorized for detection and/or diagnosis of SARS-CoV-2 by FDA under an Emergency Use Authorization (EUA). This EUA will remain  in effect (meaning this test can be used) for the duration of  the COVID-19 declaration under Section 56 4(b)(1) of the Act, 21 U.S.C. section 360bbb-3(b)(1), unless the authorization is terminated or revoked sooner. Performed at [facility], [place] [address] [place], [place] [address]   MRSA PCR Screening     Status: None   Collection Time: 11/28/19 12:43 AM   Specimen: Nasal Mucosa; Nasopharyngeal  Result Value Ref Range Status   MRSA by PCR NEGATIVE NEGATIVE Final    Comment:        The GeneXpert MRSA Assay (FDA approved for NASAL specimens only), is one component of a comprehensive MRSA colonization surveillance program. It is not intended to diagnose MRSA infection nor to guide or monitor treatment for MRSA infections. Performed at [facility], [place] [address], [place], [place] [address]      Labs: Basic Metabolic Panel: Recent Labs  Lab 11/27/19 1648 11/28/19 0323 11/28/19 0840 11/29/19 0224  NA 139 141  --  140  K 4.0 3.8  --  4.2  CL 107 111  --  112*  CO2 20* 21*  --  17*  GLUCOSE 125* 117*  --  95  BUN 55* 51*  --  49*  CREATININE 2.81* 2.73*  --  2.43*  CALCIUM 8.3* 8.5*  --  8.1*  MG  --  2.3 2.4 2.2  PHOS  --   --  4.4 4.0   Liver Function Tests: Recent Labs  Lab 11/27/19 1648 11/29/19 0224  AST 21 17  ALT 12 11  ALKPHOS 53 45  BILITOT 0.5 0.5  PROT 6.5 5.4*   ALBUMIN 3.5 2.8*   CBC: Recent Labs  Lab 11/27/19 1648 11/28/19 0840 11/29/19 0224  WBC 8.8 9.4 10.4  NEUTROABS 5.8 6.7 6.9  HGB 11.1* 10.6* 10.0*  HCT 34.0* 32.6* 31.2*  MCV 102.7* 101.2* 101.3*  PLT 208 204 186    BNP (last 3 results) Recent Labs    11/27/19 1648  BNP 139.3*   Signed:  Barton Dubois MD.  [facility] 11/29/2019, 5:34 PM

## 2019-11-29 NOTE — Progress Notes (Signed)
  [place] VALVE TEAM   Patient going for TAVR scans today with limited contrast protocol. Since GFR is under 30 we needed nephrology to sign off of scans. His primary nephrologist, Dr. Moshe Cipro, is not in the office today. I spoke to the on call physician, Dr. Joelyn Oms, who gave [place] the okay to proceed as long as we give him an hour of lactated ringers before the scans and 2 hours after the scans. Will recheck a BMET on Friday.   Angelena Form PA-C  [facility]

## 2019-11-29 NOTE — Progress Notes (Signed)
Progress Note  Patient Name: Mark Jackson. Date of Encounter: 11/29/2019  Primary Cardiologist: Mark Hew, MD   Subjective   He feels well this am, eats breakfast, denies any dizziness.  Inpatient Medications    Scheduled Meds: . diltiazem  180 mg Oral Daily  . isosorbide mononitrate  60 mg Oral Daily  . rosuvastatin  20 mg Oral QHS  . topiramate  100 mg Oral QHS   Continuous Infusions: . sodium chloride 50 mL/hr at 11/28/19 1242  . heparin 900 Units/hr (11/29/19 0303)   PRN Meds:   Vital Signs    Vitals:   11/29/19 0200 11/29/19 0330 11/29/19 0400 11/29/19 0701  BP: (!) 134/57  (!) 141/60   Pulse: 70  75   Resp: 13  17   Temp:  97.7 F (36.5 C)    TempSrc:  Oral    SpO2: 99%  99%   Weight:    81.2 kg  Height:        Intake/Output Summary (Last 24 hours) at 11/29/2019 0712 Last data filed at 11/29/2019 0659 Gross per 24 hour  Intake 1243.11 ml  Output 1075 ml  Net 168.11 ml   Last 3 Weights 11/29/2019 11/27/2019 10/31/2019  Weight (lbs) 179 lb 0.2 oz 175 lb 188 lb 6.4 oz  Weight (kg) 81.2 kg 79.379 kg 85.458 kg      Telemetry    SR- Personally Reviewed  ECG    No new tracing - Personally Reviewed  Physical Exam   GEN: No acute distress.   Neck: No JVD Cardiac: RRR, S1, no S2, 5/6 holosystolic murmur, no rubs, or gallops.  Respiratory: Clear to auscultation bilaterally. GI: Soft, nontender, non-distended  MS: No edema; No deformity. Neuro:  Nonfocal  Psych: Normal affect   Labs    High Sensitivity Troponin:   Recent Labs  Lab 11/27/19 1648 11/27/19 1851  TROPONINIHS 10 11     Chemistry Recent Labs  Lab 11/27/19 1648 11/28/19 0323  NA 139 141  K 4.0 3.8  CL 107 111  CO2 20* 21*  GLUCOSE 125* 117*  BUN 55* 51*  CREATININE 2.81* 2.73*  CALCIUM 8.3* 8.5*  PROT 6.5  --   ALBUMIN 3.5  --   AST 21  --   ALT 12  --   ALKPHOS 53  --   BILITOT 0.5  --   GFRNONAA 19* 20*  GFRAA 22* 23*  ANIONGAP 12 9     Hematology Recent Labs  Lab 11/27/19 1648 11/28/19 0840 11/29/19 0224  WBC 8.8 9.4 10.4  RBC 3.31* 3.22* 3.08*  HGB 11.1* 10.6* 10.0*  HCT 34.0* 32.6* 31.2*  MCV 102.7* 101.2* 101.3*  MCH 33.5 32.9 32.5  MCHC 32.6 32.5 32.1  RDW 13.2 13.2 13.2  PLT 208 204 186   BNP Recent Labs  Lab 11/27/19 1648  BNP 139.3*    DDimer No results for input(s): DDIMER in the last 168 hours.   Radiology    DG Chest 2 View  Result Date: 11/27/2019 CLINICAL DATA:  Syncopal episode last night. EXAM: CHEST - 2 VIEW COMPARISON:  03/19/2015 FINDINGS: Normal sized heart. Clear lungs with normal vascularity. Minimal peribronchial thickening without significant change. Mild lower thoracic spine degenerative changes. IMPRESSION: No acute abnormality. Stable minimal chronic bronchitic changes.   CT Head Wo Contrast  Result Date: 11/27/2019 CLINICAL DATA:  Syncope EXAM: CT HEAD WITHOUT CONTRAST TECHNIQUE:  IMPRESSION: 1.  No acute intracranial pathology. 2.  Small-vessel white matter disease.  3. Redemonstrated encephalomalacia of the left temporal pole versus arachnoid cyst of the left middle cranial fossa, as seen on prior examination dated 2015. Electronically Signed   By: Mark Jackson M.D.   On: 11/27/2019 17:45   DG Knee Complete 4 Views Left  Result Date: 11/27/2019 CLINICAL DATA:  Diffuse right knee pain following a syncopal episode last night. EXAM: LEFT KNEE - COMPLETE 4+ VIEW COMPARISON:  None. FINDINGS: Extensive medial and lateral meniscal calcifications. Similar appearing calcifications adjacent to the superior aspect of the patella posteriorly. No fracture, dislocation or effusion seen. IMPRESSION: 1. No fracture or effusion. 2. Extensive chondrocalcinosis.   Cardiac Studies   TTE: 11/28/2019  1. Normal LV systolic function; grade 1 diastolic dysfunction; aortic  valve not well visualized; moderate AS (mean gradient 27 mmHg) and mild  AI.  2. Left ventricular ejection fraction, by  estimation, is 60 to 65%. The  left ventricle has normal function. The left ventricle has no regional  wall motion abnormalities. Left ventricular diastolic parameters are  consistent with Grade I diastolic  dysfunction (impaired relaxation).  3. Right ventricular systolic function is normal. The right ventricular  size is normal.  4. The mitral valve is normal in structure. No evidence of mitral valve  regurgitation. No evidence of mitral stenosis.  5. The aortic valve was not well visualized. Aortic valve regurgitation  is mild. Moderate aortic valve stenosis.     Patient Profile     86 y.o. male with a hx of CAD s/p overlapping DES to ostial and proximal LAD in 2016, stable CAD and patent stents on a cath in 04/2019, paroxysmal atrial fibrillation on Eliquis for anticoagulation, moderate aortic stenosis (echo from 11/02/2019 showed peak/mean gradients 52/29 mmHg, Compared to the echo of 05/08/2019, the dimensionless index has decreased from 0.32 to 0.24) , hypertension, chronic kidney disease stage III/IV (followed by Dr. Moshe Jackson) and carotid artery disease (Carotid Doppler 10/2019 showed 60 to 79% right ICA and 1 to 39% left ICA.  Patient was seen by Dr. Trula Jackson  10/30/2019>> recommended repeat study in 6 months) who presented for the evaluation of exertional syncope at the request of Dr. Sherral Jackson. Echocardiogram 11/02/2019 showed LV function of 60 to 65%, no wall motion abnormality.   Mark Jackson presented with syncope.  He has been experiencing exertional dyspnea for 3-4 years, now worsening, especially when he walks stairs. He carried laundry up the stairs when developed dizziness and passe out after she sat down.   Assessment & Plan    I have reviewed his echo - it shows LVEF 55-60%, severely thickened and calcified aortic valve with severely restricted leaflets opening and peak/mean gradients 52/29 mmHg, DVI 0.24.   His symptoms are very typical for severe aortic stenosis, possibly  paradoxical low flow. His limitation is CKD stage 4, we will for a structural heart consult for consideration of TAVR. He had a recent cath in 04/2019 with patent stents, so he will most probably not need a repeat. He has no anginal symptoms and no evidence for a fluid overload.  If Dr Mark Jackson decides to proceed with the TAVR work up, we will arrange for a low dose contrast (CKD TAVR protocol) TAVR scan tomorrow.  He was asymptomatic overnight, no brady or tachy cardia on telemetry.   Anticipated discharge later today.  For questions or updates, please contact [place] Please consult [email or web address] for contact info under        Signed, Ena Dawley, MD  11/29/2019, 7:12 AM

## 2019-12-01 ENCOUNTER — Other Ambulatory Visit: Payer: Self-pay

## 2019-12-01 ENCOUNTER — Other Ambulatory Visit: Payer: Medicare Other | Admitting: *Deleted

## 2019-12-01 DIAGNOSIS — N289 Disorder of kidney and ureter, unspecified: Secondary | ICD-10-CM

## 2019-12-01 DIAGNOSIS — N184 Chronic kidney disease, stage 4 (severe): Secondary | ICD-10-CM | POA: Diagnosis not present

## 2019-12-01 LAB — BASIC METABOLIC PANEL
BUN/Creatinine Ratio: 19 (ref 10–24)
BUN: 44 mg/dL — ABNORMAL HIGH (ref 8–27)
CO2: 21 mmol/L (ref 20–29)
Calcium: 8.5 mg/dL — ABNORMAL LOW (ref 8.6–10.2)
Chloride: 111 mmol/L — ABNORMAL HIGH (ref 96–106)
Creatinine, Ser: 2.37 mg/dL — ABNORMAL HIGH (ref 0.76–1.27)
GFR calc Af Amer: 27 mL/min/{1.73_m2} — ABNORMAL LOW (ref >59)
GFR calc non Af Amer: 24 mL/min/{1.73_m2} — ABNORMAL LOW (ref >59)
Glucose: 100 mg/dL — ABNORMAL HIGH (ref 65–99)
Potassium: 4.4 mmol/L (ref 3.5–5.2)
Sodium: 143 mmol/L (ref 134–144)

## 2019-12-04 ENCOUNTER — Other Ambulatory Visit: Payer: Self-pay

## 2019-12-04 ENCOUNTER — Encounter: Payer: Self-pay | Admitting: Surgery

## 2019-12-04 ENCOUNTER — Telehealth: Payer: Self-pay

## 2019-12-04 ENCOUNTER — Institutional Professional Consult (permissible substitution) (INDEPENDENT_AMBULATORY_CARE_PROVIDER_SITE_OTHER): Payer: Medicare Other | Admitting: Surgery

## 2019-12-04 VITALS — BP 139/80 | HR 75 | Temp 97.7°F | Resp 16 | Ht 68.0 in | Wt 184.0 lb

## 2019-12-04 DIAGNOSIS — I6523 Occlusion and stenosis of bilateral carotid arteries: Secondary | ICD-10-CM

## 2019-12-04 DIAGNOSIS — N184 Chronic kidney disease, stage 4 (severe): Secondary | ICD-10-CM | POA: Diagnosis not present

## 2019-12-04 DIAGNOSIS — I350 Nonrheumatic aortic (valve) stenosis: Secondary | ICD-10-CM

## 2019-12-04 DIAGNOSIS — I5189 Other ill-defined heart diseases: Secondary | ICD-10-CM

## 2019-12-04 DIAGNOSIS — I25119 Atherosclerotic heart disease of native coronary artery with unspecified angina pectoris: Secondary | ICD-10-CM | POA: Diagnosis not present

## 2019-12-04 NOTE — Telephone Encounter (Signed)
Perfect Courtney-thanks for being such a great resource and support to him

## 2019-12-04 NOTE — Progress Notes (Addendum)
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 Hold        diltiazem (CARDIZEM CD) 24 hr capsule 180 mg (mg) Total dose:  Cannot be calculated*    *Administration dose not documented    Date/Time     Rate/Dose/Volume  Action  04/26/19 1325  *Not included in total MAR Hold        ferrous sulfate tablet 325 mg (mg) Total dose:  Cannot be calculated* Dosing weight:   81.1    *Administration dose not documented  Date/Time     Rate/Dose/Volume  Action  04/26/19 1325  *Not included in total MAR Hold        heparin injection 5,000 Units (Units) Total dose:  Cannot be calculated* Dosing weight:  81.1    *Administration dose not documented    Date/Time     Rate/Dose/Volume  Action  04/26/19 1325  *Not included in total MAR Hold  1400  *Not included in total Automatically Held        isosorbide mononitrate (IMDUR) 24 hr tablet 60 mg (mg) Total dose:  Cannot be calculated* Dosing weight:  81.1    *Administration dose not documented    Date/Time     Rate/Dose/Volume  Action  04/26/19 1325  *Not included in total MAR Hold        nitroGLYCERIN (NITROSTAT) SL tablet 0.4 mg (mg) Total dose:  Cannot be calculated*    *Administration dose not documented    Date/Time     Rate/Dose/Volume  Action  04/26/19 1325  *Not included in total MAR Hold        rosuvastatin (CRESTOR) tablet 20 mg (mg) Total dose:  Cannot be calculated*    *Administration dose not documented    Date/Time     Rate/Dose/Volume  Action  04/26/19 1325  *Not included in total MAR Hold        sodium chloride (OCEAN) 0.65 % nasal spray 1 spray (spray) Total dose:  Cannot be calculated* Dosing weight:  81.1    *Administration dose not documented    Date/Time     Rate/Dose/Volume  Action  04/26/19 1325  *Not included in total MAR Hold        sodium chloride flush (NS) 0.9 % injection 3 mL (mL) Total dose:  Cannot be calculated* Dosing weight:  81.1    *Administration dose not documented    Date/Time     Rate/Dose/Volume  Action  04/26/19 1325  *Not included in total MAR Hold        topiramate (TOPAMAX) tablet 100 mg (mg) Total dose:  Cannot be calculated* Dosing weight:  81.1    *Administration dose not documented    Date/Time     Rate/Dose/Volume  Action  04/26/19 1325   *Not included in total MAR Hold          Sedation Time      Sedation Time Physician-1: 22 minutes 10 seconds           Contrast       Medication Name   Total Dose    iohexol (OMNIPAQUE) 350 MG/ML injection   45 mL           Radiation/Fluoro      Fluoro time: 4 (min)  DAP: 20777 (mGycm2)  Cumulative Air Kerma: 432 (mGy)         Complications      Complications documented before study signed (04/26/2019  2:52 PM)       No complications were associated with this study.  Documented by [name], MD - 04/26/2019  2:35 PM          Coronary Findings     Diagnostic Dominance: Left   Left Main  Vessel was injected. Vessel is large. Vessel is angiographically normal.   Left Anterior Descending  Vessel was injected. Vessel is normal in caliber. Vessel is angiographically normal.  Ost LAD lesion 0% stenosed  Previously placed Ost LAD drug eluting stent is widely patent. The lesion is smooth. Overlapping Synergy DES stent -  Synergy DES 3.0 x12  Prox LAD lesion 0% stenosed  Previously placed Prox LAD drug eluting stent is widely patent. The lesion is smooth. Focal in-stent restenosis Synergy DES 2.75 x 16 (3.3) overlapping stent   First Diagonal Branch  Vessel was injected. Vessel is normal in size. Vessel is moderate-large size Vessel is angiographically normal.   Lateral First Diagonal Branch  Vessel was injected. Vessel is small in size. Vessel is angiographically normal.   First Septal Branch  Vessel was injected. Vessel is moderate in size. Vessel is angiographically normal.   Second Septal Branch  Vessel was injected. Vessel is small in size. Vessel is angiographically normal.   Third Septal Branch  Vessel was injected. Vessel is small in size. Vessel is angiographically normal.   Ramus Intermedius  Vessel was injected. Vessel is large. Vessel is angiographically normal.   Left Circumflex  Vessel was  injected. Vessel is large. Vessel is angiographically normal.   First Obtuse Marginal Branch  Vessel was injected. Vessel is small in size. Vessel is angiographically normal.   Second Obtuse Marginal Branch  Vessel was injected. Vessel is small in size. Vessel is angiographically normal.   Left Posterior Descending Artery  Vessel was injected. Vessel is moderate in size. Vessel is angiographically normal.   First Left Posterolateral Branch  Vessel was injected. Vessel is moderate in size. Vessel is angiographically normal.   Second Left Posterolateral Branch  Vessel was injected. Vessel is small in size. Vessel is angiographically normal.   Right Coronary Artery  Vessel was injected. Vessel is normal in caliber. Vessel is angiographically normal.   Acute Marginal Branch  Vessel was injected. Vessel is small in size. Vessel is angiographically normal.   Right Ventricular Branch  Vessel was injected. Vessel is small in size. Vessel is angiographically normal.     Intervention   No interventions have been documented.                   Wall Motion     Resting     No left ventriculogram performed              Left Heart   Left Ventricle LV end diastolic pressure is normal.   Aortic Valve There is moderate aortic valve stenosis. Most likely Moderate-Severe        Coronary Diagrams     Diagnostic Dominance: Left   Diagnostic Image    Intervention           Implants      No implant documentation for this case.         Syngo Images    Show images for CARDIAC CATHETERIZATION        Images on Long Term Storage    Show images for Julia, Kulzer.          Link to Procedure Log   Procedure Log             Hemo Data       Most Recent Value   Aortic Mean Gradient 30.84 mmHg  Aortic Peak Gradient 36 mmHg  AO Systolic Pressure 732 mmHg  AO Diastolic Pressure 54 mmHg  AO  Mean 76 mmHg  LV Systolic Pressure 202 mmHg  LV Diastolic Pressure -7 mmHg  LV EDP 10 mmHg  AOp Systolic Pressure 542 mmHg  AOp Diastolic Pressure 50 mmHg  AOp Mean Pressure 74 mmHg  LVp Systolic Pressure 706 mmHg  LVp Diastolic Pressure 0 mmHg  LVp  EDP Pressure 14 mmHg    ADDENDUM REPORT: 11/29/2019 23:09  CLINICAL DATA:  86 year old male with severe aortic stenosis being evaluated for a TAVR procedure.  EXAM: Cardiac TAVR CT  TECHNIQUE: The patient was scanned on a [employer]. A 120 kV retrospective scan was triggered in the descending thoracic aorta at 111 HU's. Gantry rotation speed was 250 msecs and collimation was .6 mm. No beta blockade or nitro were given. The 3D data set was reconstructed in 5% intervals of the R-R cycle. Systolic and diastolic phases were analyzed on a dedicated work station using MPR, MIP and VRT modes. The patient received 60 cc of contrast.  FINDINGS: Aortic Valve: Trileaflet, with severely thickened and calcified leaflets and no calcifications extending into the LVOT.  Aorta: Normal size, very mild diffuse atherosclerotic plaque and calcifications. No dissection.  Sinotubular Junction: 29 x 28 mm  Ascending Thoracic Aorta: 35 x 34 mm  Aortic Arch: 26 x 25 mm  Descending Thoracic Aorta: 24 x 23 mm  Sinus of Valsalva Measurements:  Non-coronary: 33 mm  Right -coronary: 33 mm  Left -coronary: 33 mm  Coronary Artery Height above Annulus:  Left Main: 13.6 mm  Right Coronary: 18.3 mm  Virtual Basal Annulus Measurements:  Maximum/Minimum Diameter: 29.9 x 22.8 mm  Mean Diameter: 25.3 mm  Perimeter: 82.5 mm  Area: 505 mm2  Optimum Fluoroscopic Angle for Delivery: LAO 11 CAU 8  IMPRESSION: 1. Trileaflet, with severely thickened and calcified leaflets and no calcifications extending into the LVOT. Aortic valve calcium score 2255 consistent with severe aortic stenosis. Annular  measurements suitable for delivery of a 26 mm Edwards-SAPIEN 3 Ultra valve.  2. Sufficient coronary to annulus distance.  3. Optimum Fluoroscopic Angle for Delivery:  LAO 11 CAU 8.  4. No thrombus in the left atrial appendage.   Electronically Signed   By: Ena Dawley   On: 11/29/2019 23:09   Addended by Dorothy Spark, MD on 11/29/2019 11:12 PM    Study Result  EXAM: OVER-READ INTERPRETATION  CT CHEST  The following report is an over-read performed by radiologist Dr. Vinnie Langton of [facility], [place] on 11/29/2019. This over-read does not include interpretation of cardiac or coronary anatomy or pathology. The coronary calcium score/coronary CTA interpretation by the cardiologist is attached.  COMPARISON:  None.  FINDINGS: Extracardiac findings will be described separately under dictation for contemporaneously obtained CTA chest, abdomen and pelvis dated 11/29/2019.  IMPRESSION: Please see separate dictation for contemporaneously obtained CTA chest, abdomen and pelvis 11/29/2019 for full description of relevant extracardiac findings.  Electronically Signed: By: Vinnie Langton M.D. On: 11/29/2019 16:13       CLINICAL DATA:  86 year old male with history of severe aortic stenosis. Preprocedural study prior to potential transcatheter aortic valve replacement (TAVR).  EXAM: CT ANGIOGRAPHY CHEST, ABDOMEN AND PELVIS  TECHNIQUE: Non-contrast CT of the chest was initially obtained.  Multidetector CT imaging through the chest, abdomen and pelvis was performed using the standard protocol during bolus administration of intravenous contrast. Multiplanar reconstructed images and MIPs were obtained and reviewed to evaluate the vascular anatomy.  CONTRAST:  86m OMNIPAQUE IOHEXOL 350 MG/ML SOLN  COMPARISON:  No priors.  FINDINGS: CTA CHEST FINDINGS  Cardiovascular: Heart size is borderline enlarged. There is no significant  pericardial fluid, thickening or pericardial calcification. Left anterior descending coronary artery stent. Aortic atherosclerosis. Severe thickening calcification of the aortic valve. Mild calcifications of the mitral annulus.  Mediastinum/Lymph Nodes: No pathologically enlarged mediastinal or hilar  lymph nodes. Esophagus is unremarkable in appearance. No axillary lymphadenopathy.  Lungs/Pleura: No acute consolidative airspace disease. No pleural effusions. No suspicious appearing pulmonary nodules or masses are noted.  Musculoskeletal/Soft Tissues: There are no aggressive appearing lytic or blastic lesions noted in the visualized portions of the skeleton.  CTA ABDOMEN AND PELVIS FINDINGS  Hepatobiliary: No suspicious cystic or solid hepatic lesions. No intra or extrahepatic biliary ductal dilatation. Gallbladder is normal in appearance.  Pancreas: No pancreatic mass. No pancreatic ductal dilatation. No pancreatic or peripancreatic fluid collections or inflammatory changes.  Spleen: Unremarkable.  Adrenals/Urinary Tract: Mild atrophy of the kidneys bilaterally. No suspicious renal lesions. No hydroureteronephrosis. Bilateral adrenal glands are normal in appearance. Urinary bladder is normal in appearance.  Stomach/Bowel: The appearance of the stomach is normal. No pathologic dilatation of small bowel or colon. Numerous colonic diverticulae are noted, particularly in the sigmoid colon, without surrounding inflammatory changes to suggest an acute diverticulitis at this time. The appendix is not confidently identified and may be surgically absent. Regardless, there are no inflammatory changes noted adjacent to the cecum to suggest the presence of an acute appendicitis at this time.  Vascular/Lymphatic: Aortic atherosclerosis, with vascular findings and measurements pertinent to potential TAVR procedure, as detailed below. In addition, there is an ulcerated plaque  with vascular web in the infrarenal abdominal aorta best appreciated on axial image 144 of series 15. No lymphadenopathy noted in the abdomen or pelvis.  Reproductive: Numerous brachytherapy implants are noted throughout the prostate gland. Seminal vesicles are unremarkable in appearance.  Other: No significant volume of ascites.  No pneumoperitoneum.  Musculoskeletal: There are no aggressive appearing lytic or blastic lesions noted in the visualized portions of the skeleton.  VASCULAR MEASUREMENTS PERTINENT TO TAVR:  AORTA:  Minimal Aortic Diameter-11 x 11 mm  Severity of Aortic Calcification-severe  RIGHT PELVIS:  Right Common Iliac Artery -  Minimal Diameter-7.9 x 6.2 mm  Tortuosity-moderate  Calcification-moderate  Right External Iliac Artery -  Minimal Diameter-8.4 x 8.7 mm  Tortuosity-moderate to severe  Calcification-none  Right Common Femoral Artery -  Minimal Diameter-9.3 x 9.3 mm  Tortuosity-mild  Calcification-none  LEFT PELVIS:  Left Common Iliac Artery -  Minimal Diameter-8.3 x 7.7 mm  Tortuosity-severe  Calcification-moderate  Left External Iliac Artery -  Minimal Diameter-7.7 x 6.7 mm  Tortuosity-moderate  Calcification-minimal  Left Common Femoral Artery -  Minimal Diameter-7.5 x 7.4 mm  Tortuosity-mild  Calcification-none  Review of the MIP images confirms the above findings.  IMPRESSION: 1. Vascular findings and measurements pertinent to potential TAVR procedure, as detailed above. 2. Severe thickening calcification of the aortic valve, compatible with reported clinical history of severe aortic stenosis. 3. Mild atrophy of the kidneys bilaterally. 4. Mild colonic diverticulosis without evidence of acute diverticulitis at this time. 5. Additional incidental findings, as above.   Electronically Signed   By: Vinnie Langton M.D.   On: 11/30/2019 08:24   STS Risk  Calculator:  Procedure: Isolated AVR  Risk of Mortality:5.984% Renal Failure:22.901% Permanent Stroke:4.572% Prolonged Ventilation:22.220% DSW Infection:0.173% Reoperation:6.283% Morbidity or Mortality:43.175% Short Length of Stay:12.198% Long Length of Stay:20.792%   Impression:  This 86 year old gentleman has stage D, severe, symptomatic aortic stenosis with New York Heart Association class II symptoms of exertional fatigue and shortness of breath consistent with chronic diastolic congestive heart failure.  He presented with exertional dizziness and syncope.  He never had any documented heart block on the monitor or on any of his EKGs.  He has a history  of severe single-vessel coronary disease with prior stenting of the ostial and proximal LAD in 2016 and 2017.  His most recent catheterization in September 2020 showed widely patent stents in the ostial and proximal LAD with otherwise normal coronary arteries.  There was moderate to severe aortic stenosis with a mean gradient of 31 mmHg and a peak gradient of 36 mmHg.  I have personally reviewed his 2D echocardiogram, cardiac catheterization, and CTA studies.  The echo from 11/28/2019 had poor visualization of the aortic valve but the mean gradient was measured at 26.5 mmHg.  Peak gradient was 45.8 mmHg with a valve area of 1.09 cm.  The 2D echo on 11/02/2019 showed much better detail of the aortic valve and it was a trileaflet aortic valve that was heavily calcified and had restricted mobility.  This looks like a severely stenotic valve despite a mean gradient of only 26.5 mmHg.  I suspect the patient has low gradient, normal ejection fraction severe aortic stenosis given his symptoms.  I agree that aortic valve replacement is indicated in this patient for improvement of his symptoms and prevent progressive left ventricular deterioration.  Given his advanced age I think transcatheter aortic valve replacement be the best treatment for him.  His  gated cardiac CTA shows anatomy amenable to transcatheter aortic valve replacement using a SAPIEN 3 valve.  His abdominal and pelvic CTA shows diffuse aortoiliac vascular disease with an ulcerated plaque in the infrarenal abdominal aorta and an adjacent vascular web that looks like a focal aortic dissection with calcifications within the web.  He has significant laminated thrombus within the abdominal aorta.  I think these findings would increase his risk of vascular complications with transfemoral approach.  His left subclavian artery appears adequate for access with a widely patent origin from the aortic arch.  I think this is probably the best option for access.  The patient and his daughter were counseled at length regarding treatment alternatives for management of severe symptomatic aortic stenosis. The risks and benefits of surgical intervention has been discussed in detail. Long-term prognosis with medical therapy was discussed. Alternative approaches such as conventional surgical aortic valve replacement, transcatheter aortic valve replacement, and palliative medical therapy were compared and contrasted at length. This discussion was placed in the context of the patient's own specific clinical presentation and past medical history. All of their questions have been addressed.   Following the decision to proceed with transcatheter aortic valve replacement, a discussion was held regarding what types of management strategies would be attempted intraoperatively in the event of life-threatening complications, including whether or not the patient would be considered a candidate for the use of cardiopulmonary bypass and/or conversion to open sternotomy for attempted surgical intervention. The patient is aware of the fact that transient use of cardiopulmonary bypass may be necessary.  He is an 86 year old gentleman with stage IV chronic kidney disease and would not have a good outcome with emergent sternotomy to  manage any intraoperative complications although I would consider it depending on the situation.  The patient has been advised of a variety of complications that might develop including but not limited to risks of death, stroke, paravalvular leak, aortic dissection or other major vascular complications, aortic annulus rupture, device embolization, cardiac rupture or perforation, mitral regurgitation, acute myocardial infarction, arrhythmia, heart block or bradycardia requiring permanent pacemaker placement, congestive heart failure, respiratory failure, renal failure, pneumonia, infection, other late complications related to structural valve deterioration or migration, or other complications that might ultimately  cause a temporary or permanent loss of functional independence or other long term morbidity. The patient provides full informed consent for the procedure as described and all questions were answered.     Plan:  Left subclavian access for transcatheter aortic valve placement using a SAPIEN 3 valve on Tuesday, 12/12/2019.  He has been instructed to discontinue Eliquis after his dose this Wednesday, 12/06/2019 in preparation for surgery.  He will continue Plavix for his coronary stents.  I spent 80 minutes performing this consultation and > 50% of this time was spent face to face counseling and coordinating the care of this patient's severe symptomatic aortic stenosis.    Gaye Pollack, MD 12/04/2019

## 2019-12-04 NOTE — Telephone Encounter (Signed)
Patient being followed by cardiology.  Seen 12/04/19.  He is scheduled to have TAVR on 12/12/19.  He wanted me to voice his appreciation for the great care that he has received.  He will call office with any future concerns

## 2019-12-05 ENCOUNTER — Other Ambulatory Visit: Payer: Self-pay

## 2019-12-05 DIAGNOSIS — I350 Nonrheumatic aortic (valve) stenosis: Secondary | ICD-10-CM

## 2019-12-06 ENCOUNTER — Encounter: Payer: Medicare Other | Admitting: Surgery

## 2019-12-07 NOTE — Progress Notes (Signed)
[place], [place] [address] Phone: [phone] Fax: [place], [place] - [address] [place] [address] Phone: [phone] Fax: [phone]      Your procedure is scheduled on Tuesday May 4  Report to [facility] Main Entrance "A" at 0830 A.M., and check in at the Admitting office.  Call this number if you have problems the morning of surgery:  [phone]  Call [phone] if you have any questions prior to your surgery date Monday-Friday 8am-4pm    Remember:  Do not eat or drink after midnight the night before your surgery     There are NO medications that you need to take the day of surgery  Last Dose of Eliquis should be on 12/06/19 DO NOT stop Plavix  As of today, STOP taking any Aspirin (unless otherwise instructed by your surgeon) and Aspirin containing products, Aleve, Naproxen, Ibuprofen, Motrin, Advil, Goody's, BC's, all herbal medications, fish oil, and all vitamins.                      Do not wear jewelry            Do not wear lotions, powders, colognes, or deodorant.           Men may shave face and neck.            Do not bring valuables to the hospital.            [facility] is not responsible for any belongings or valuables.  Do NOT Smoke (Tobacco/Vapping) or drink Alcohol 24 hours prior to your procedure If you use a CPAP at night, you may bring all equipment for your overnight stay.   Contacts, glasses, dentures or bridgework may not be worn into surgery.      For patients admitted to the hospital, discharge time will be determined by your treatment team.   Patients discharged the day of surgery will not be allowed to drive home, and someone needs to stay with them for 24 hours.    Special instructions:   [facility]- Preparing For Surgery  Before surgery, you can play an  important role. Because skin is not sterile, your skin needs to be as free of germs as possible. You can reduce the number of germs on your skin by washing with CHG (chlorahexidine gluconate) Soap before surgery.  CHG is an antiseptic cleaner which kills germs and bonds with the skin to continue killing germs even after washing.    Oral Hygiene is also important to reduce your risk of infection.  Remember - BRUSH YOUR TEETH THE MORNING OF SURGERY WITH YOUR REGULAR TOOTHPASTE  Please do not use if you have an allergy to CHG or antibacterial soaps. If your skin becomes reddened/irritated stop using the CHG.  Do not shave (including legs and underarms) for at least 48 hours prior to first CHG shower. It is OK to shave your face.  Please follow these instructions carefully.   1. Shower the NIGHT BEFORE SURGERY and the MORNING OF SURGERY with CHG Soap.   2. If you chose to wash your hair, wash your hair first as usual with your normal shampoo.  3. After you shampoo, rinse your hair and body thoroughly to remove the shampoo.  4. Use CHG as you would any other liquid soap. You  can apply CHG directly to the skin and wash gently with a scrungie or a clean washcloth.   5. Apply the CHG Soap to your body ONLY FROM THE NECK DOWN.  Do not use on open wounds or open sores. Avoid contact with your eyes, ears, mouth and genitals (private parts). Wash Face and genitals (private parts)  with your normal soap.   6. Wash thoroughly, paying special attention to the area where your surgery will be performed.  7. Thoroughly rinse your body with warm water from the neck down.  8. DO NOT shower/wash with your normal soap after using and rinsing off the CHG Soap.  9. Pat yourself dry with a CLEAN TOWEL.  10. Wear CLEAN PAJAMAS to bed the night before surgery, wear comfortable clothes the morning of surgery  11. Place CLEAN SHEETS on your bed the night of your first shower and DO NOT SLEEP WITH PETS.   Day of  Surgery:   Do not apply any deodorants/lotions.  Please wear clean clothes to the hospital/surgery center.   Remember to brush your teeth WITH YOUR REGULAR TOOTHPASTE.   Please read over the following fact sheets that you were given.

## 2019-12-08 ENCOUNTER — Other Ambulatory Visit: Payer: Self-pay

## 2019-12-08 ENCOUNTER — Encounter: Payer: Self-pay | Admitting: Physical Therapy

## 2019-12-08 ENCOUNTER — Encounter (HOSPITAL_COMMUNITY): Payer: Self-pay

## 2019-12-08 ENCOUNTER — Encounter (HOSPITAL_COMMUNITY)
Admission: RE | Admit: 2019-12-08 | Discharge: 2019-12-08 | Disposition: A | Discharge status: Home / Self Care | Payer: Medicare Other | Source: Ambulatory Visit | Attending: Cardiovascular Disease | Admitting: Cardiovascular Disease

## 2019-12-08 ENCOUNTER — Ambulatory Visit (HOSPITAL_COMMUNITY)
Admission: RE | Admit: 2019-12-08 | Discharge: 2019-12-08 | Disposition: A | Discharge status: Home / Self Care | Payer: Medicare Other | Source: Ambulatory Visit | Attending: Cardiovascular Disease | Admitting: Cardiovascular Disease

## 2019-12-08 ENCOUNTER — Ambulatory Visit: Payer: Medicare Other | Attending: Cardiovascular Disease | Admitting: Physical Therapy

## 2019-12-08 DIAGNOSIS — I350 Nonrheumatic aortic (valve) stenosis: Secondary | ICD-10-CM

## 2019-12-08 DIAGNOSIS — J984 Other disorders of lung: Secondary | ICD-10-CM | POA: Insufficient documentation

## 2019-12-08 DIAGNOSIS — Z01818 Encounter for other preprocedural examination: Secondary | ICD-10-CM | POA: Diagnosis not present

## 2019-12-08 DIAGNOSIS — R2689 Other abnormalities of gait and mobility: Secondary | ICD-10-CM | POA: Insufficient documentation

## 2019-12-08 HISTORY — DX: Anxiety disorder, unspecified: F41.9

## 2019-12-08 HISTORY — DX: Dyspnea, unspecified: R06.00

## 2019-12-08 LAB — TYPE AND SCREEN
ABO/RH(D): O POS
Antibody Screen: NEGATIVE

## 2019-12-08 LAB — CBC
HCT: 33.2 % — ABNORMAL LOW (ref 39.0–52.0)
Hemoglobin: 10.8 g/dL — ABNORMAL LOW (ref 13.0–17.0)
MCH: 33.3 pg (ref 26.0–34.0)
MCHC: 32.5 g/dL (ref 30.0–36.0)
MCV: 102.5 fL — ABNORMAL HIGH (ref 80.0–100.0)
Platelets: 238 10*3/uL (ref 150–400)
RBC: 3.24 MIL/uL — ABNORMAL LOW (ref 4.22–5.81)
RDW: 12.9 % (ref 11.5–15.5)
WBC: 10.8 10*3/uL — ABNORMAL HIGH (ref 4.0–10.5)
nRBC: 0 % (ref 0.0–0.2)

## 2019-12-08 LAB — COMPREHENSIVE METABOLIC PANEL
ALT: 11 U/L (ref 0–44)
AST: 17 U/L (ref 15–41)
Albumin: 3.5 g/dL (ref 3.5–5.0)
Alkaline Phosphatase: 54 U/L (ref 38–126)
Anion gap: 9 (ref 5–15)
BUN: 42 mg/dL — ABNORMAL HIGH (ref 8–23)
CO2: 19 mmol/L — ABNORMAL LOW (ref 22–32)
Calcium: 8.9 mg/dL (ref 8.9–10.3)
Chloride: 113 mmol/L — ABNORMAL HIGH (ref 98–111)
Creatinine, Ser: 2.62 mg/dL — ABNORMAL HIGH (ref 0.61–1.24)
GFR calc Af Amer: 24 mL/min — ABNORMAL LOW (ref >60)
GFR calc non Af Amer: 21 mL/min — ABNORMAL LOW (ref >60)
Glucose, Bld: 101 mg/dL — ABNORMAL HIGH (ref 70–99)
Potassium: 4.6 mmol/L (ref 3.5–5.1)
Sodium: 141 mmol/L (ref 135–145)
Total Bilirubin: 0.4 mg/dL (ref 0.3–1.2)
Total Protein: 6.6 g/dL (ref 6.5–8.1)

## 2019-12-08 LAB — BLOOD GAS, ARTERIAL
Acid-base deficit: 5.6 mmol/L — ABNORMAL HIGH (ref 0.0–2.0)
Bicarbonate: 18.8 mmol/L — ABNORMAL LOW (ref 20.0–28.0)
Drawn by: 421801
FIO2: 21
O2 Saturation: 98.8 %
Patient temperature: 37
pCO2 arterial: 33.9 mmHg (ref 32.0–48.0)
pH, Arterial: 7.363 (ref 7.350–7.450)
pO2, Arterial: 144 mmHg — ABNORMAL HIGH (ref 83.0–108.0)

## 2019-12-08 LAB — SURGICAL PCR SCREEN
MRSA, PCR: NEGATIVE
Staphylococcus aureus: NEGATIVE

## 2019-12-08 LAB — PROTIME-INR
INR: 1.1 (ref 0.8–1.2)
Prothrombin Time: 13.8 seconds (ref 11.4–15.2)

## 2019-12-08 LAB — URINALYSIS, ROUTINE W REFLEX MICROSCOPIC
Bilirubin Urine: NEGATIVE
Glucose, UA: NEGATIVE mg/dL
Hgb urine dipstick: NEGATIVE
Ketones, ur: NEGATIVE mg/dL
Leukocytes,Ua: NEGATIVE
Nitrite: NEGATIVE
Protein, ur: NEGATIVE mg/dL
Specific Gravity, Urine: 1.017 (ref 1.005–1.030)
pH: 5 (ref 5.0–8.0)

## 2019-12-08 LAB — HEMOGLOBIN A1C
Hgb A1c MFr Bld: 6.1 % — ABNORMAL HIGH (ref 4.8–5.6)
Mean Plasma Glucose: 128.37 mg/dL

## 2019-12-08 LAB — ABO/RH: ABO/RH(D): O POS

## 2019-12-08 LAB — APTT: aPTT: 32 seconds (ref 24–36)

## 2019-12-08 LAB — BRAIN NATRIURETIC PEPTIDE: B Natriuretic Peptide: 172.9 pg/mL — ABNORMAL HIGH (ref 0.0–100.0)

## 2019-12-08 MED ORDER — CHLORHEXIDINE GLUCONATE 4 % EX LIQD: 30.0000 mL | CUTANEOUS | Status: DC
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
## 2019-12-08 NOTE — Progress Notes (Signed)
PCP - [facility] Cardiologist -   Dr Wilhemina Cash    cooper     hest x-ray  4/30 EKG - 4/21 Stress Test - na ECHO - 4/21 Cardiac Cath - 9/20      Blood Thinner Instructions:hold eliquis   Do not stop plaxix   Per Dr Burt Knack Aspirin Instructions:        COVID TEST- for sat   Anesthesia review: heart hx  Patient denies shortness of breath, fever, cough and chest pain at PAT appointment   All instructions explained to the patient, with a verbal understanding of the material. Patient agrees to go over the instructions while at home for a better understanding. Patient also instructed to self quarantine after being tested for COVID-19. The opportunity to ask questions was provided.

## 2019-12-09 ENCOUNTER — Other Ambulatory Visit (HOSPITAL_COMMUNITY)
Admission: RE | Admit: 2019-12-09 | Discharge: 2019-12-09 | Disposition: A | Discharge status: Home / Self Care | Payer: Medicare Other | Source: Ambulatory Visit | Attending: Cardiovascular Disease | Admitting: Cardiovascular Disease

## 2019-12-09 DIAGNOSIS — Z01812 Encounter for preprocedural laboratory examination: Secondary | ICD-10-CM | POA: Insufficient documentation

## 2019-12-09 DIAGNOSIS — Z20822 Contact with and (suspected) exposure to covid-19: Secondary | ICD-10-CM | POA: Insufficient documentation

## 2019-12-09 LAB — SARS CORONAVIRUS 2 (TAT 6-24 HRS): SARS Coronavirus 2: NEGATIVE

## 2019-12-11 MED ORDER — SODIUM CHLORIDE 0.9 % IV SOLN
INTRAVENOUS | Status: DC
  Filled 2019-12-11: qty 30

## 2019-12-11 MED ORDER — NOREPINEPHRINE 4 MG/250ML-% IV SOLN
0.0000 ug/min | INTRAVENOUS | Status: AC
  Administered 2019-12-12: 2 ug/min via INTRAVENOUS
  Filled 2019-12-11: qty 250

## 2019-12-11 MED ORDER — POTASSIUM CHLORIDE 2 MEQ/ML IV SOLN
80.0000 meq | INTRAVENOUS | Status: DC
  Filled 2019-12-11: qty 40

## 2019-12-11 MED ORDER — MAGNESIUM SULFATE 50 % IJ SOLN
40.0000 meq | INTRAMUSCULAR | Status: DC
  Filled 2019-12-11: qty 9.85

## 2019-12-11 MED ORDER — VANCOMYCIN HCL 1500 MG/300ML IV SOLN
1500.0000 mg | INTRAVENOUS | Status: AC
  Administered 2019-12-12: 1500 mg via INTRAVENOUS
  Filled 2019-12-11: qty 300

## 2019-12-11 MED ORDER — DEXMEDETOMIDINE HCL IN NACL 400 MCG/100ML IV SOLN
0.1000 ug/kg/h | INTRAVENOUS | Status: AC
  Administered 2019-12-12: .4 ug/kg/h via INTRAVENOUS
  Filled 2019-12-11: qty 100

## 2019-12-11 MED ORDER — SODIUM CHLORIDE 0.9 % IV SOLN
1.5000 g | INTRAVENOUS | Status: AC
  Administered 2019-12-12: 1.5 g via INTRAVENOUS
  Filled 2019-12-11: qty 1.5

## 2019-12-11 NOTE — H&P (Signed)
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  04/26/19 1325 *Not included in total MAR Hold  1400 *Not included in total Automatically Held  isosorbide mononitrate (IMDUR) 24 hr tablet 60 mg (mg) Total dose: Cannot be calculated* Dosing weight: 81.1  *Administration dose not documented  Date/Time  Rate/Dose/Volume  Action  04/26/19 1325 *Not included in total MAR Hold  nitroGLYCERIN (NITROSTAT) SL tablet 0.4 mg (mg) Total dose: Cannot be calculated*  *Administration dose not documented  Date/Time  Rate/Dose/Volume  Action  04/26/19 1325 *Not included in total MAR Hold  rosuvastatin (CRESTOR) tablet 20 mg (mg) Total dose: Cannot be calculated*  *Administration dose not documented  Date/Time  Rate/Dose/Volume  Action  04/26/19 1325 *Not included in total MAR Hold  sodium chloride (OCEAN) 0.65 % nasal spray 1 spray (spray) Total dose:  Cannot be calculated* Dosing weight: 81.1  *Administration dose not documented  Date/Time  Rate/Dose/Volume  Action  04/26/19 1325 *Not included in total MAR Hold  sodium chloride flush (NS) 0.9 % injection 3 mL (mL) Total dose: Cannot be calculated* Dosing weight: 81.1  *Administration dose not documented  Date/Time  Rate/Dose/Volume  Action  04/26/19 1325 *Not included in total MAR Hold  topiramate (TOPAMAX) tablet 100 mg (mg) Total dose: Cannot be calculated* Dosing weight: 81.1  *Administration dose not documented  Date/Time  Rate/Dose/Volume  Action  04/26/19 1325 *Not included in total MAR Hold  Sedation Time  Sedation Time Physician-1: 22 minutes 10 seconds  Contrast  Medication Name  Total Dose  iohexol (OMNIPAQUE) 350 MG/ML injection  45 mL  Radiation/Fluoro  Fluoro time: 4 (min)  DAP: 20777 (mGycm2)  Cumulative Air Kerma: 062 (mGy)  Complications  Complications documented before study signed (04/26/2019 3:76 PM)  No complications were associated with this study.  Documented by [name], MD - 04/26/2019 2:35 PM  Coronary Findings  Diagnostic Dominance: Left  Left Main  Vessel was injected. Vessel is large. Vessel is angiographically normal.  Left Anterior Descending  Vessel was injected. Vessel is normal in caliber. Vessel is angiographically normal.  Ost LAD lesion 0% stenosed  Previously placed Ost LAD drug eluting stent is widely patent. The lesion is smooth. Overlapping Synergy DES stent -Synergy DES 3.0 x12  Prox LAD lesion 0% stenosed  Previously placed Prox LAD drug eluting stent is widely patent. The lesion is smooth. Focal in-stent restenosis Synergy DES 2.75 x 16 (3.3) overlapping stent  First Diagonal Branch  Vessel was injected. Vessel is normal in size. Vessel is moderate-large size Vessel is angiographically normal.  Lateral First Diagonal Branch  Vessel was injected. Vessel is small in size. Vessel is angiographically normal.  First Septal  Branch  Vessel was injected. Vessel is moderate in size. Vessel is angiographically normal.  Second Septal Branch  Vessel was injected. Vessel  is small in size. Vessel is angiographically normal.  Third Septal Branch  Vessel was injected. Vessel is small in size. Vessel is angiographically normal.  Ramus Intermedius  Vessel was injected. Vessel is large. Vessel is angiographically normal.  Left Circumflex  Vessel was injected. Vessel is large. Vessel is angiographically normal.  First Obtuse Marginal Branch  Vessel was injected. Vessel is small in size. Vessel is angiographically normal.  Second Obtuse Marginal Branch  Vessel was injected. Vessel is small in size. Vessel is angiographically normal.  Left Posterior Descending Artery  Vessel was injected. Vessel is moderate in size. Vessel is angiographically normal.  First Left Posterolateral Branch  Vessel was injected. Vessel is moderate in size. Vessel is angiographically normal.  Second Left Posterolateral Branch  Vessel was injected. Vessel is small in size. Vessel is angiographically normal.  Right Coronary Artery  Vessel was injected. Vessel is normal in caliber. Vessel is angiographically normal.  Acute Marginal Branch  Vessel was injected. Vessel is small in size. Vessel is angiographically normal.  Right Ventricular Branch  Vessel was injected. Vessel is small in size. Vessel is angiographically normal.  Intervention  No interventions have been documented.  Wall Motion  Resting  No left ventriculogram performed  Left Heart  Left Ventricle LV end diastolic pressure is normal.  Aortic Valve There is moderate aortic valve stenosis. Most likely Moderate-Severe  Coronary Diagrams  Diagnostic Dominance: Left  Diagnostic Image  Intervention  Implants  No implant documentation for this case.  Syngo Images  Show images for CARDIAC CATHETERIZATION  Images on Long Term Storage  Show images for Bashir, Marchetti.  Link to  Procedure Log  Procedure Log  Hemo Data  Most Recent Value  Aortic Mean Gradient 30.84 mmHg  Aortic Peak Gradient 36 mmHg  AO Systolic Pressure 841 mmHg  AO Diastolic Pressure 54 mmHg  AO Mean 76 mmHg  LV Systolic Pressure 324 mmHg  LV Diastolic Pressure -7 mmHg  LV EDP 10 mmHg  AOp Systolic Pressure 401 mmHg  AOp Diastolic Pressure 50 mmHg  AOp Mean Pressure 74 mmHg  LVp Systolic Pressure 027 mmHg  LVp Diastolic Pressure 0 mmHg  LVp EDP Pressure 14 mmHg    ADDENDUM REPORT: 11/29/2019 23:09  CLINICAL DATA: 86 year old male with severe aortic stenosis being  evaluated for a TAVR procedure.  EXAM:  Cardiac TAVR CT  TECHNIQUE:  The patient was scanned on a [employer]. A 120 kV  retrospective scan was triggered in the descending thoracic aorta at  111 HU's. Gantry rotation speed was 250 msecs and collimation was .6  mm. No beta blockade or nitro were given. The 3D data set was  reconstructed in 5% intervals of the R-R cycle. Systolic and  diastolic phases were analyzed on a dedicated work station using  MPR, MIP and VRT modes. The patient received 60 cc of contrast.  FINDINGS:  Aortic Valve: Trileaflet, with severely thickened and calcified  leaflets and no calcifications extending into the LVOT.  Aorta: Normal size, very mild diffuse atherosclerotic plaque and  calcifications. No dissection.  Sinotubular Junction: 29 x 28 mm  Ascending Thoracic Aorta: 35 x 34 mm  Aortic Arch: 26 x 25 mm  Descending Thoracic Aorta: 24 x 23 mm  Sinus of Valsalva Measurements:  Non-coronary: 33 mm  Right -coronary: 33 mm  Left -coronary: 33 mm  Coronary Artery Height above Annulus:  Left Main: 13.6 mm  Right Coronary: 18.3 mm  Virtual Basal Annulus Measurements:  Maximum/Minimum Diameter: 29.9 x 22.8 mm  Mean Diameter: 25.3 mm  Perimeter: 82.5 mm  Area: 505 mm2  Optimum Fluoroscopic Angle for Delivery: LAO 11 CAU 8  IMPRESSION:  1. Trileaflet, with severely thickened and  calcified leaflets and no  calcifications extending into the LVOT. Aortic valve calcium score  2255 consistent with severe aortic stenosis. Annular measurements  suitable for delivery of a 26 mm Edwards-SAPIEN 3 Ultra valve.  2. Sufficient coronary to annulus distance.  3. Optimum Fluoroscopic Angle for Delivery: LAO 11 CAU 8.  4. No thrombus in the left atrial appendage.  Electronically Signed  By: Ena Dawley  On: 11/29/2019 23:09   Addended by Dorothy Spark, MD on 11/29/2019 11:12 PM  Study Result   EXAM:  OVER-READ INTERPRETATION CT CHEST  The following report is an over-read performed by radiologist Dr.  Vinnie Langton of [facility], [place] on 11/29/2019. This  over-read does not include interpretation of cardiac or coronary  anatomy or pathology. The coronary calcium score/coronary CTA  interpretation by the cardiologist is attached.  COMPARISON: None.  FINDINGS:  Extracardiac findings will be described separately under dictation  for contemporaneously obtained CTA chest, abdomen and pelvis dated  11/29/2019.  IMPRESSION:  Please see separate dictation for contemporaneously obtained CTA  chest, abdomen and pelvis 11/29/2019 for full description of  relevant extracardiac findings.  Electronically Signed:  By: Vinnie Langton M.D.  On: 11/29/2019 16:13   CLINICAL DATA: 86 year old male with history of severe aortic  stenosis. Preprocedural study prior to potential transcatheter  aortic valve replacement (TAVR).  EXAM:  CT ANGIOGRAPHY CHEST, ABDOMEN AND PELVIS  TECHNIQUE:  Non-contrast CT of the chest was initially obtained.  Multidetector CT imaging through the chest, abdomen and pelvis was  performed using the standard protocol during bolus administration of  intravenous contrast. Multiplanar reconstructed images and MIPs were  obtained and reviewed to evaluate the vascular anatomy.  CONTRAST: 41m OMNIPAQUE IOHEXOL 350 MG/ML SOLN  COMPARISON: No priors.   FINDINGS:  CTA CHEST FINDINGS  Cardiovascular: Heart size is borderline enlarged. There is no  significant pericardial fluid, thickening or pericardial  calcification. Left anterior descending coronary artery stent.  Aortic atherosclerosis. Severe thickening calcification of the  aortic valve. Mild calcifications of the mitral annulus.  Mediastinum/Lymph Nodes: No pathologically enlarged mediastinal or  hilar lymph nodes. Esophagus is unremarkable in appearance. No  axillary lymphadenopathy.  Lungs/Pleura: No acute consolidative airspace disease. No pleural  effusions. No suspicious appearing pulmonary nodules or masses are  noted.  Musculoskeletal/Soft Tissues: There are no aggressive appearing  lytic or blastic lesions noted in the visualized portions of the  skeleton.  CTA ABDOMEN AND PELVIS FINDINGS  Hepatobiliary: No suspicious cystic or solid hepatic lesions. No  intra or extrahepatic biliary ductal dilatation. Gallbladder is  normal in appearance.  Pancreas: No pancreatic mass. No pancreatic ductal dilatation. No  pancreatic or peripancreatic fluid collections or inflammatory  changes.  Spleen: Unremarkable.  Adrenals/Urinary Tract: Mild atrophy of the kidneys bilaterally. No  suspicious renal lesions. No hydroureteronephrosis. Bilateral  adrenal glands are normal in appearance. Urinary bladder is normal  in appearance.  Stomach/Bowel: The appearance of the stomach is normal. No  pathologic dilatation of small bowel or colon. Numerous colonic  diverticulae are noted, particularly in the sigmoid colon, without  surrounding inflammatory changes to suggest an acute diverticulitis  at this time. The appendix is not confidently identified and may be  surgically absent. Regardless, there are no inflammatory changes  noted adjacent  to the cecum to suggest the presence of an acute  appendicitis at this time.  Vascular/Lymphatic: Aortic atherosclerosis, with vascular findings   and measurements pertinent to potential TAVR procedure, as detailed  below. In addition, there is an ulcerated plaque with vascular web  in the infrarenal abdominal aorta best appreciated on axial image  144 of series 15. No lymphadenopathy noted in the abdomen or pelvis.  Reproductive: Numerous brachytherapy implants are noted throughout  the prostate gland. Seminal vesicles are unremarkable in appearance.  Other: No significant volume of ascites. No pneumoperitoneum.  Musculoskeletal: There are no aggressive appearing lytic or blastic  lesions noted in the visualized portions of the skeleton.  VASCULAR MEASUREMENTS PERTINENT TO TAVR:  AORTA:  Minimal Aortic Diameter-11 x 11 mm  Severity of Aortic Calcification-severe  RIGHT PELVIS:  Right Common Iliac Artery -  Minimal Diameter-7.9 x 6.2 mm  Tortuosity-moderate  Calcification-moderate  Right External Iliac Artery -  Minimal Diameter-8.4 x 8.7 mm  Tortuosity-moderate to severe  Calcification-none  Right Common Femoral Artery -  Minimal Diameter-9.3 x 9.3 mm  Tortuosity-mild  Calcification-none  LEFT PELVIS:  Left Common Iliac Artery -  Minimal Diameter-8.3 x 7.7 mm  Tortuosity-severe  Calcification-moderate  Left External Iliac Artery -  Minimal Diameter-7.7 x 6.7 mm  Tortuosity-moderate  Calcification-minimal  Left Common Femoral Artery -  Minimal Diameter-7.5 x 7.4 mm  Tortuosity-mild  Calcification-none  Review of the MIP images confirms the above findings.  IMPRESSION:  1. Vascular findings and measurements pertinent to potential TAVR  procedure, as detailed above.  2. Severe thickening calcification of the aortic valve, compatible  with reported clinical history of severe aortic stenosis.  3. Mild atrophy of the kidneys bilaterally.  4. Mild colonic diverticulosis without evidence of acute  diverticulitis at this time.  5. Additional incidental findings, as above.  Electronically Signed  By: Vinnie Langton  M.D.  On: 11/30/2019 08:24    STS Risk Calculator:   Procedure: Isolated AVR  Risk of Mortality:5.984%  Renal Failure:22.901%  Permanent Stroke:4.572%  Prolonged Ventilation:22.220%  DSW Infection:0.173%  Reoperation:6.283%  Morbidity or Mortality:43.175%  Short Length of Stay:12.198%  Long Length of Stay:20.792%    Impression:    This 86 year old gentleman has stage D, severe, symptomatic aortic stenosis with New York Heart Association class II symptoms of exertional fatigue and shortness of breath consistent with chronic diastolic congestive heart failure. He presented with exertional dizziness and syncope. He never had any documented heart block on the monitor or on any of his EKGs. He has a history of severe single-vessel coronary disease with prior stenting of the ostial and proximal LAD in 2016 and 2017. His most recent catheterization in September 2020 showed widely patent stents in the ostial and proximal LAD with otherwise normal coronary arteries. There was moderate to severe aortic stenosis with a mean gradient of 31 mmHg and a peak gradient of 36 mmHg. I have personally reviewed his 2D echocardiogram, cardiac catheterization, and CTA studies. The echo from 11/28/2019 had poor visualization of the aortic valve but the mean gradient was measured at 26.5 mmHg. Peak gradient was 45.8 mmHg with a valve area of 1.09 cm. The 2D echo on 11/02/2019 showed much better detail of the aortic valve and it was a trileaflet aortic valve that was heavily calcified and had restricted mobility. This looks like a severely stenotic valve despite a mean gradient of only 26.5 mmHg. I suspect the patient has low gradient, normal ejection fraction severe aortic stenosis given his  symptoms. I agree that aortic valve replacement is indicated in this patient for improvement of his symptoms and prevent progressive left ventricular deterioration. Given his advanced age I think transcatheter aortic valve  replacement be the best treatment for him. His gated cardiac CTA shows anatomy amenable to transcatheter aortic valve replacement using a SAPIEN 3 valve. His abdominal and pelvic CTA shows diffuse aortoiliac vascular disease with an ulcerated plaque in the infrarenal abdominal aorta and an adjacent vascular web that looks like a focal aortic dissection with calcifications within the web. He has significant laminated thrombus within the abdominal aorta. I think these findings would increase his risk of vascular complications with transfemoral approach. His left subclavian artery appears adequate for access with a widely patent origin from the aortic arch. I think this is probably the best option for access.  The patient and his daughter were counseled at length regarding treatment alternatives for management of severe symptomatic aortic stenosis. The risks and benefits of surgical intervention has been discussed in detail. Long-term prognosis with medical therapy was discussed. Alternative approaches such as conventional surgical aortic valve replacement, transcatheter aortic valve replacement, and palliative medical therapy were compared and contrasted at length. This discussion was placed in the context of the patient's own specific clinical presentation and past medical history. All of their questions have been addressed.  Following the decision to proceed with transcatheter aortic valve replacement, a discussion was held regarding what types of management strategies would be attempted intraoperatively in the event of life-threatening complications, including whether or not the patient would be considered a candidate for the use of cardiopulmonary bypass and/or conversion to open sternotomy for attempted surgical intervention. The patient is aware of the fact that transient use of cardiopulmonary bypass may be necessary. He is an 86 year old gentleman with stage IV chronic kidney disease and would not have a  good outcome with emergent sternotomy to manage any intraoperative complications although I would consider it depending on the situation. The patient has been advised of a variety of complications that might develop including but not limited to risks of death, stroke, paravalvular leak, aortic dissection or other major vascular complications, aortic annulus rupture, device embolization, cardiac rupture or perforation, mitral regurgitation, acute myocardial infarction, arrhythmia, heart block or bradycardia requiring permanent pacemaker placement, congestive heart failure, respiratory failure, renal failure, pneumonia, infection, other late complications related to structural valve deterioration or migration, or other complications that might ultimately cause a temporary or permanent loss of functional independence or other long term morbidity. The patient provides full informed consent for the procedure as described and all questions were answered.    Plan:   Left subclavian access for transcatheter aortic valve placement using a SAPIEN 3 valve.   Gaye Pollack, MD

## 2019-12-12 ENCOUNTER — Inpatient Hospital Stay (HOSPITAL_COMMUNITY): Payer: Medicare Other

## 2019-12-12 ENCOUNTER — Other Ambulatory Visit: Payer: Self-pay

## 2019-12-12 ENCOUNTER — Encounter (HOSPITAL_COMMUNITY)
Admission: RE | Disposition: A | Discharge status: Home / Self Care | Payer: Self-pay | Source: Home / Self Care | Attending: Surgery

## 2019-12-12 ENCOUNTER — Encounter (HOSPITAL_COMMUNITY): Payer: Self-pay | Admitting: Cardiovascular Disease

## 2019-12-12 ENCOUNTER — Inpatient Hospital Stay (HOSPITAL_COMMUNITY): Payer: Medicare Other | Admitting: Certified Registered Nurse Anesthetist

## 2019-12-12 ENCOUNTER — Inpatient Hospital Stay (HOSPITAL_COMMUNITY): Payer: Medicare Other | Admitting: Vascular Surgery

## 2019-12-12 ENCOUNTER — Other Ambulatory Visit: Payer: Self-pay | Admitting: Physician Assistant

## 2019-12-12 ENCOUNTER — Inpatient Hospital Stay (HOSPITAL_COMMUNITY)
Admission: RE | Admit: 2019-12-12 | Discharge: 2019-12-13 | DRG: 266 | Disposition: A | Discharge status: Home / Self Care | Payer: Medicare Other | Attending: Surgery | Admitting: Surgery

## 2019-12-12 DIAGNOSIS — Z8546 Personal history of malignant neoplasm of prostate: Secondary | ICD-10-CM | POA: Diagnosis not present

## 2019-12-12 DIAGNOSIS — Z952 Presence of prosthetic heart valve: Secondary | ICD-10-CM | POA: Diagnosis not present

## 2019-12-12 DIAGNOSIS — I350 Nonrheumatic aortic (valve) stenosis: Secondary | ICD-10-CM

## 2019-12-12 DIAGNOSIS — I480 Paroxysmal atrial fibrillation: Secondary | ICD-10-CM | POA: Diagnosis present

## 2019-12-12 DIAGNOSIS — I5033 Acute on chronic diastolic (congestive) heart failure: Secondary | ICD-10-CM

## 2019-12-12 DIAGNOSIS — Z006 Encounter for examination for normal comparison and control in clinical research program: Secondary | ICD-10-CM

## 2019-12-12 DIAGNOSIS — I10 Essential (primary) hypertension: Secondary | ICD-10-CM | POA: Diagnosis present

## 2019-12-12 DIAGNOSIS — G4719 Other hypersomnia: Secondary | ICD-10-CM | POA: Diagnosis present

## 2019-12-12 DIAGNOSIS — I130 Hypertensive heart and chronic kidney disease with heart failure and stage 1 through stage 4 chronic kidney disease, or unspecified chronic kidney disease: Secondary | ICD-10-CM | POA: Diagnosis present

## 2019-12-12 DIAGNOSIS — Z20822 Contact with and (suspected) exposure to covid-19: Secondary | ICD-10-CM | POA: Diagnosis present

## 2019-12-12 DIAGNOSIS — I25110 Atherosclerotic heart disease of native coronary artery with unstable angina pectoris: Secondary | ICD-10-CM | POA: Diagnosis present

## 2019-12-12 DIAGNOSIS — Z87891 Personal history of nicotine dependence: Secondary | ICD-10-CM

## 2019-12-12 DIAGNOSIS — Z7902 Long term (current) use of antithrombotics/antiplatelets: Secondary | ICD-10-CM | POA: Diagnosis not present

## 2019-12-12 DIAGNOSIS — D649 Anemia, unspecified: Secondary | ICD-10-CM | POA: Diagnosis present

## 2019-12-12 DIAGNOSIS — I252 Old myocardial infarction: Secondary | ICD-10-CM

## 2019-12-12 DIAGNOSIS — N184 Chronic kidney disease, stage 4 (severe): Secondary | ICD-10-CM | POA: Diagnosis present

## 2019-12-12 DIAGNOSIS — I25119 Atherosclerotic heart disease of native coronary artery with unspecified angina pectoris: Secondary | ICD-10-CM | POA: Diagnosis present

## 2019-12-12 DIAGNOSIS — Z7901 Long term (current) use of anticoagulants: Secondary | ICD-10-CM | POA: Diagnosis not present

## 2019-12-12 DIAGNOSIS — I779 Disorder of arteries and arterioles, unspecified: Secondary | ICD-10-CM | POA: Diagnosis present

## 2019-12-12 DIAGNOSIS — I25118 Atherosclerotic heart disease of native coronary artery with other forms of angina pectoris: Secondary | ICD-10-CM | POA: Diagnosis present

## 2019-12-12 DIAGNOSIS — I129 Hypertensive chronic kidney disease with stage 1 through stage 4 chronic kidney disease, or unspecified chronic kidney disease: Secondary | ICD-10-CM | POA: Diagnosis not present

## 2019-12-12 DIAGNOSIS — Z85828 Personal history of other malignant neoplasm of skin: Secondary | ICD-10-CM | POA: Diagnosis not present

## 2019-12-12 DIAGNOSIS — J9811 Atelectasis: Secondary | ICD-10-CM | POA: Diagnosis not present

## 2019-12-12 DIAGNOSIS — E785 Hyperlipidemia, unspecified: Secondary | ICD-10-CM | POA: Diagnosis present

## 2019-12-12 DIAGNOSIS — I739 Peripheral vascular disease, unspecified: Secondary | ICD-10-CM | POA: Diagnosis present

## 2019-12-12 DIAGNOSIS — G4730 Sleep apnea, unspecified: Secondary | ICD-10-CM | POA: Diagnosis present

## 2019-12-12 DIAGNOSIS — Z96651 Presence of right artificial knee joint: Secondary | ICD-10-CM | POA: Diagnosis present

## 2019-12-12 DIAGNOSIS — Z955 Presence of coronary angioplasty implant and graft: Secondary | ICD-10-CM | POA: Diagnosis not present

## 2019-12-12 DIAGNOSIS — I5032 Chronic diastolic (congestive) heart failure: Secondary | ICD-10-CM

## 2019-12-12 DIAGNOSIS — D539 Nutritional anemia, unspecified: Secondary | ICD-10-CM | POA: Diagnosis present

## 2019-12-12 DIAGNOSIS — Z954 Presence of other heart-valve replacement: Secondary | ICD-10-CM | POA: Diagnosis not present

## 2019-12-12 HISTORY — PX: TRANSCATHETER AORTIC VALVE REPLACEMENT, TRANSFEMORAL: SHX6400

## 2019-12-12 HISTORY — DX: Presence of prosthetic heart valve: Z95.2

## 2019-12-12 HISTORY — PX: TEE WITHOUT CARDIOVERSION: SHX5443

## 2019-12-12 LAB — POCT I-STAT, CHEM 8
BUN: 38 mg/dL — ABNORMAL HIGH (ref 8–23)
BUN: 36 mg/dL — ABNORMAL HIGH (ref 8–23)
BUN: 36 mg/dL — ABNORMAL HIGH (ref 8–23)
BUN: 37 mg/dL — ABNORMAL HIGH (ref 8–23)
Calcium, Ion: 1.17 mmol/L (ref 1.15–1.40)
Calcium, Ion: 1.19 mmol/L (ref 1.15–1.40)
Calcium, Ion: 1.17 mmol/L (ref 1.15–1.40)
Calcium, Ion: 1.18 mmol/L (ref 1.15–1.40)
Chloride: 111 mmol/L (ref 98–111)
Chloride: 111 mmol/L (ref 98–111)
Chloride: 112 mmol/L — ABNORMAL HIGH (ref 98–111)
Chloride: 113 mmol/L — ABNORMAL HIGH (ref 98–111)
Creatinine, Ser: 2.3 mg/dL — ABNORMAL HIGH (ref 0.61–1.24)
Creatinine, Ser: 2.3 mg/dL — ABNORMAL HIGH (ref 0.61–1.24)
Creatinine, Ser: 2.4 mg/dL — ABNORMAL HIGH (ref 0.61–1.24)
Creatinine, Ser: 2.5 mg/dL — ABNORMAL HIGH (ref 0.61–1.24)
Glucose, Bld: 92 mg/dL (ref 70–99)
Glucose, Bld: 91 mg/dL (ref 70–99)
Glucose, Bld: 103 mg/dL — ABNORMAL HIGH (ref 70–99)
Glucose, Bld: 104 mg/dL — ABNORMAL HIGH (ref 70–99)
HCT: 28 % — ABNORMAL LOW (ref 39.0–52.0)
HCT: 26 % — ABNORMAL LOW (ref 39.0–52.0)
HCT: 26 % — ABNORMAL LOW (ref 39.0–52.0)
HCT: 27 % — ABNORMAL LOW (ref 39.0–52.0)
Hemoglobin: 9.5 g/dL — ABNORMAL LOW (ref 13.0–17.0)
Hemoglobin: 8.8 g/dL — ABNORMAL LOW (ref 13.0–17.0)
Hemoglobin: 8.8 g/dL — ABNORMAL LOW (ref 13.0–17.0)
Hemoglobin: 9.2 g/dL — ABNORMAL LOW (ref 13.0–17.0)
Potassium: 4.2 mmol/L (ref 3.5–5.1)
Potassium: 4.5 mmol/L (ref 3.5–5.1)
Potassium: 4.5 mmol/L (ref 3.5–5.1)
Potassium: 4.6 mmol/L (ref 3.5–5.1)
Sodium: 144 mmol/L (ref 135–145)
Sodium: 143 mmol/L (ref 135–145)
Sodium: 141 mmol/L (ref 135–145)
Sodium: 144 mmol/L (ref 135–145)
TCO2: 22 mmol/L (ref 22–32)
TCO2: 20 mmol/L — ABNORMAL LOW (ref 22–32)
TCO2: 20 mmol/L — ABNORMAL LOW (ref 22–32)
TCO2: 22 mmol/L (ref 22–32)

## 2019-12-12 SURGERY — IMPLANTATION, AORTIC VALVE, TRANSCATHETER, SUBCLAVIAN ARTERY APPROACH
Anesthesia: General | Site: Chest

## 2019-12-12 MED ORDER — SODIUM CHLORIDE 0.9 % IV SOLN: INTRAVENOUS | Status: DC

## 2019-12-12 MED ORDER — SUGAMMADEX SODIUM 200 MG/2ML IV SOLN
INTRAVENOUS | Status: DC | PRN
  Administered 2019-12-12: 200 mg via INTRAVENOUS

## 2019-12-12 MED ORDER — ROSUVASTATIN CALCIUM 20 MG PO TABS
20.0000 mg | ORAL_TABLET | Freq: Every day | ORAL | Status: DC
  Administered 2019-12-12: 20 mg via ORAL
  Filled 2019-12-12: qty 1

## 2019-12-12 MED ORDER — FENTANYL CITRATE (PF) 100 MCG/2ML IJ SOLN
INTRAMUSCULAR | Status: AC
  Administered 2019-12-12: 50 ug via INTRAVENOUS
  Filled 2019-12-12: qty 2

## 2019-12-12 MED ORDER — TOPIRAMATE 25 MG PO TABS
200.0000 mg | ORAL_TABLET | Freq: Every day | ORAL | Status: DC
  Administered 2019-12-12 – 2019-12-13 (×2): 200 mg via ORAL
  Filled 2019-12-12 (×2): qty 8

## 2019-12-12 MED ORDER — SODIUM CHLORIDE 0.9 % IV SOLN
1.5000 g | Freq: Two times a day (BID) | INTRAVENOUS | Status: DC
  Administered 2019-12-12 – 2019-12-13 (×2): 1.5 g via INTRAVENOUS
  Filled 2019-12-12 (×3): qty 1.5

## 2019-12-12 MED ORDER — VANCOMYCIN HCL IN DEXTROSE 1-5 GM/200ML-% IV SOLN
1000.0000 mg | Freq: Once | INTRAVENOUS | Status: AC
  Administered 2019-12-12: 1000 mg via INTRAVENOUS
  Filled 2019-12-12 (×2): qty 200

## 2019-12-12 MED ORDER — FENTANYL CITRATE (PF) 250 MCG/5ML IJ SOLN
INTRAMUSCULAR | Status: DC | PRN
  Administered 2019-12-12 (×2): 75 ug via INTRAVENOUS

## 2019-12-12 MED ORDER — PROPOFOL 10 MG/ML IV BOLUS
INTRAVENOUS | Status: DC | PRN
  Administered 2019-12-12: 70 mg via INTRAVENOUS

## 2019-12-12 MED ORDER — SODIUM CHLORIDE 0.9 % IV SOLN: INTRAVENOUS | Status: AC

## 2019-12-12 MED ORDER — PROTAMINE SULFATE 10 MG/ML IV SOLN
INTRAVENOUS | Status: DC | PRN
  Administered 2019-12-12: 60 mg via INTRAVENOUS
  Administered 2019-12-12: 20 mg via INTRAVENOUS
  Administered 2019-12-12: 50 mg via INTRAVENOUS

## 2019-12-12 MED ORDER — PROPOFOL 10 MG/ML IV BOLUS
INTRAVENOUS | Status: AC
  Filled 2019-12-12: qty 20

## 2019-12-12 MED ORDER — CLEVIDIPINE BUTYRATE 0.5 MG/ML IV EMUL
INTRAVENOUS | Status: DC | PRN
  Administered 2019-12-12: 4 mg/h via INTRAVENOUS

## 2019-12-12 MED ORDER — HYDRALAZINE HCL 20 MG/ML IJ SOLN: 10.0000 mg | INTRAMUSCULAR | Status: DC | PRN

## 2019-12-12 MED ORDER — DEXAMETHASONE SODIUM PHOSPHATE 10 MG/ML IJ SOLN
INTRAMUSCULAR | Status: AC
  Filled 2019-12-12: qty 1

## 2019-12-12 MED ORDER — HEPARIN SODIUM (PORCINE) 1000 UNIT/ML IJ SOLN
INTRAMUSCULAR | Status: DC | PRN
  Administered 2019-12-12: 13000 [IU] via INTRAVENOUS

## 2019-12-12 MED ORDER — FENTANYL CITRATE (PF) 250 MCG/5ML IJ SOLN
INTRAMUSCULAR | Status: AC
  Filled 2019-12-12: qty 5

## 2019-12-12 MED ORDER — SODIUM CHLORIDE 0.9 % IV SOLN
INTRAVENOUS | Status: DC | PRN
  Administered 2019-12-12: 1000 mL

## 2019-12-12 MED ORDER — ROCURONIUM BROMIDE 10 MG/ML (PF) SYRINGE
PREFILLED_SYRINGE | INTRAVENOUS | Status: AC
  Filled 2019-12-12: qty 10

## 2019-12-12 MED ORDER — DEXAMETHASONE SODIUM PHOSPHATE 10 MG/ML IJ SOLN
INTRAMUSCULAR | Status: DC | PRN
  Administered 2019-12-12: 10 mg via INTRAVENOUS

## 2019-12-12 MED ORDER — HEPARIN SODIUM (PORCINE) 1000 UNIT/ML IJ SOLN
INTRAMUSCULAR | Status: AC
  Filled 2019-12-12: qty 1

## 2019-12-12 MED ORDER — CHLORHEXIDINE GLUCONATE 4 % EX LIQD: 60.0000 mL | Freq: Once | CUTANEOUS | Status: DC

## 2019-12-12 MED ORDER — ISOSORBIDE MONONITRATE ER 60 MG PO TB24
60.0000 mg | ORAL_TABLET | Freq: Every day | ORAL | Status: DC
  Administered 2019-12-12 – 2019-12-13 (×2): 60 mg via ORAL
  Filled 2019-12-12 (×2): qty 1

## 2019-12-12 MED ORDER — LABETALOL HCL 5 MG/ML IV SOLN
10.0000 mg | INTRAVENOUS | Status: DC | PRN
  Administered 2019-12-12: 10 mg via INTRAVENOUS
  Filled 2019-12-12: qty 4

## 2019-12-12 MED ORDER — ONDANSETRON HCL 4 MG/2ML IJ SOLN
INTRAMUSCULAR | Status: AC
  Filled 2019-12-12: qty 2

## 2019-12-12 MED ORDER — TRAMADOL HCL 50 MG PO TABS: 50.0000 mg | ORAL_TABLET | ORAL | Status: DC | PRN

## 2019-12-12 MED ORDER — LIDOCAINE 2% (20 MG/ML) 5 ML SYRINGE
INTRAMUSCULAR | Status: AC
  Filled 2019-12-12: qty 5

## 2019-12-12 MED ORDER — CLOPIDOGREL BISULFATE 75 MG PO TABS
75.0000 mg | ORAL_TABLET | Freq: Every day | ORAL | Status: DC
  Administered 2019-12-13: 75 mg via ORAL
  Filled 2019-12-12: qty 1

## 2019-12-12 MED ORDER — ACETAMINOPHEN 650 MG RE SUPP: 650.0000 mg | Freq: Four times a day (QID) | RECTAL | Status: DC | PRN

## 2019-12-12 MED ORDER — LACTATED RINGERS IV SOLN
INTRAVENOUS | Status: DC
Start: 2019-12-12 — End: 2019-12-12

## 2019-12-12 MED ORDER — ONDANSETRON HCL 4 MG/2ML IJ SOLN: 4.0000 mg | Freq: Four times a day (QID) | INTRAMUSCULAR | Status: DC | PRN

## 2019-12-12 MED ORDER — MORPHINE SULFATE (PF) 2 MG/ML IV SOLN: 1.0000 mg | INTRAVENOUS | Status: DC | PRN

## 2019-12-12 MED ORDER — IODIXANOL 320 MG/ML IV SOLN
INTRAVENOUS | Status: DC | PRN
  Administered 2019-12-12: 14:00:00 42 mL

## 2019-12-12 MED ORDER — NITROGLYCERIN IN D5W 200-5 MCG/ML-% IV SOLN: 0.0000 ug/min | INTRAVENOUS | Status: DC

## 2019-12-12 MED ORDER — CHLORHEXIDINE GLUCONATE 0.12 % MT SOLN
15.0000 mL | Freq: Once | OROMUCOSAL | Status: AC
  Administered 2019-12-12: 15 mL via OROMUCOSAL
  Filled 2019-12-12: qty 15

## 2019-12-12 MED ORDER — MIDAZOLAM HCL 2 MG/2ML IJ SOLN
INTRAMUSCULAR | Status: AC
  Filled 2019-12-12: qty 2

## 2019-12-12 MED ORDER — OXYCODONE HCL 5 MG PO TABS: 5.0000 mg | ORAL_TABLET | ORAL | Status: DC | PRN

## 2019-12-12 MED ORDER — ACETAMINOPHEN 325 MG PO TABS: 650.0000 mg | ORAL_TABLET | Freq: Four times a day (QID) | ORAL | Status: DC | PRN

## 2019-12-12 MED ORDER — SODIUM CHLORIDE 0.9 % IV SOLN
INTRAVENOUS | Status: AC
  Filled 2019-12-12 (×3): qty 1.2

## 2019-12-12 MED ORDER — SODIUM CHLORIDE 0.9% FLUSH
3.0000 mL | Freq: Two times a day (BID) | INTRAVENOUS | Status: DC
  Administered 2019-12-12 – 2019-12-13 (×2): 3 mL via INTRAVENOUS

## 2019-12-12 MED ORDER — SODIUM CHLORIDE 0.9 % IV SOLN: 250.0000 mL | INTRAVENOUS | Status: DC | PRN

## 2019-12-12 MED ORDER — PHENYLEPHRINE HCL-NACL 20-0.9 MG/250ML-% IV SOLN
0.0000 ug/min | INTRAVENOUS | Status: DC
  Filled 2019-12-12: qty 250

## 2019-12-12 MED ORDER — PROTAMINE SULFATE 10 MG/ML IV SOLN
INTRAVENOUS | Status: AC
  Filled 2019-12-12: qty 25

## 2019-12-12 MED ORDER — SODIUM CHLORIDE 0.9% FLUSH: 3.0000 mL | INTRAVENOUS | Status: DC | PRN

## 2019-12-12 MED ORDER — ONDANSETRON HCL 4 MG/2ML IJ SOLN
INTRAMUSCULAR | Status: DC | PRN
  Administered 2019-12-12: 4 mg via INTRAVENOUS

## 2019-12-12 MED ORDER — FERROUS SULFATE 325 (65 FE) MG PO TABS
325.0000 mg | ORAL_TABLET | Freq: Every day | ORAL | Status: DC
  Administered 2019-12-13: 325 mg via ORAL
  Filled 2019-12-12: qty 1

## 2019-12-12 MED ORDER — FENTANYL CITRATE (PF) 100 MCG/2ML IJ SOLN: 50.0000 ug | Freq: Once | INTRAMUSCULAR | Status: AC

## 2019-12-12 MED ORDER — ROCURONIUM BROMIDE 10 MG/ML (PF) SYRINGE
PREFILLED_SYRINGE | INTRAVENOUS | Status: DC | PRN
  Administered 2019-12-12: 60 mg via INTRAVENOUS

## 2019-12-12 SURGICAL SUPPLY — 75 items
ADH SKN CLS APL DERMABOND .7 (GAUZE/BANDAGES/DRESSINGS) ×2
BAG DECANTER FOR FLEXI CONT (MISCELLANEOUS) ×2 IMPLANT
BAG SNAP BAND KOVER 36X36 (MISCELLANEOUS) ×4 IMPLANT
BLADE CLIPPER SURG (BLADE) ×3 IMPLANT
BLADE STERNUM SYSTEM 6 (BLADE) IMPLANT
CABLE ADAPT CONN TEMP 6FT (ADAPTER) ×4 IMPLANT
CATH DIAG EXPO 6F AL1 (CATHETERS) IMPLANT
CATH DIAG EXPO 6F VENT PIG 145 (CATHETERS) ×8 IMPLANT
CATH INFINITI 6F AL2 (CATHETERS) IMPLANT
CATH S G BIP PACING (CATHETERS) ×4 IMPLANT
CLIP VESOCCLUDE MED 24/CT (CLIP) ×3 IMPLANT
CLIP VESOCCLUDE SM WIDE 24/CT (CLIP) ×3 IMPLANT
CLOSURE MYNX CONTROL 6F/7F (Vascular Products) ×2 IMPLANT
CNTNR URN SCR LID CUP LEK RST (MISCELLANEOUS) ×4 IMPLANT
CONT SPEC 4OZ STRL OR WHT (MISCELLANEOUS) ×8
COVER BACK TABLE 80X110 HD (DRAPES) ×8 IMPLANT
COVER DOME SNAP 22 D (MISCELLANEOUS) ×2 IMPLANT
COVER WAND RF STERILE (DRAPES) ×4 IMPLANT
DERMABOND ADVANCED (GAUZE/BANDAGES/DRESSINGS) ×2
DERMABOND ADVANCED .7 DNX12 (GAUZE/BANDAGES/DRESSINGS) ×2 IMPLANT
DRSG TEGADERM 4X4.75 (GAUZE/BANDAGES/DRESSINGS) ×6 IMPLANT
ELECT CAUTERY BLADE 6.4 (BLADE) ×3 IMPLANT
ELECT REM PT RETURN 9FT ADLT (ELECTROSURGICAL) ×8
ELECTRODE REM PT RTRN 9FT ADLT (ELECTROSURGICAL) ×4 IMPLANT
FELT TEFLON 6X6 (MISCELLANEOUS) IMPLANT
GAUZE SPONGE 2X2 8PLY STRL LF (GAUZE/BANDAGES/DRESSINGS) IMPLANT
GAUZE SPONGE 4X4 12PLY STRL (GAUZE/BANDAGES/DRESSINGS) ×4 IMPLANT
GLOVE BIO SURGEON STRL SZ7.5 (GLOVE) IMPLANT
GLOVE BIO SURGEON STRL SZ8 (GLOVE) ×4 IMPLANT
GLOVE BIOGEL PI IND STRL 7.5 (GLOVE) ×3 IMPLANT
GLOVE BIOGEL PI IND STRL 8 (GLOVE) IMPLANT
GLOVE BIOGEL PI INDICATOR 7.5 (GLOVE) ×6
GLOVE BIOGEL PI INDICATOR 8 (GLOVE) ×2
GOWN STRL REUS W/ TWL LRG LVL3 (GOWN DISPOSABLE) IMPLANT
GOWN STRL REUS W/ TWL XL LVL3 (GOWN DISPOSABLE) ×2 IMPLANT
GOWN STRL REUS W/TWL LRG LVL3 (GOWN DISPOSABLE) ×16
GOWN STRL REUS W/TWL XL LVL3 (GOWN DISPOSABLE) ×12
GUIDEWIRE SAFE TJ AMPLATZ EXST (WIRE) ×4 IMPLANT
KIT BASIN OR (CUSTOM PROCEDURE TRAY) ×4 IMPLANT
KIT HEART LEFT (KITS) ×4 IMPLANT
KIT SUCTION CATH 14FR (SUCTIONS) ×3 IMPLANT
KIT TURNOVER KIT B (KITS) ×4 IMPLANT
LOOP VESSEL MAXI BLUE (MISCELLANEOUS) ×3 IMPLANT
LOOP VESSEL MINI RED (MISCELLANEOUS) ×2 IMPLANT
NDL PERC 18GX7CM (NEEDLE) ×2 IMPLANT
NEEDLE PERC 18GX7CM (NEEDLE) ×4 IMPLANT
NS IRRIG 1000ML POUR BTL (IV SOLUTION) ×12 IMPLANT
PACK ENDOVASCULAR (PACKS) ×4 IMPLANT
PAD ARMBOARD 7.5X6 YLW CONV (MISCELLANEOUS) ×8 IMPLANT
PAD ELECT DEFIB RADIOL ZOLL (MISCELLANEOUS) ×4 IMPLANT
PENCIL BUTTON HOLSTER BLD 10FT (ELECTRODE) ×4 IMPLANT
POSITIONER HEAD DONUT 9IN (MISCELLANEOUS) ×4 IMPLANT
SHEATH BRITE TIP 6FR 35CM (SHEATH) ×4 IMPLANT
SHEATH PINNACLE 6F 10CM (SHEATH) ×4 IMPLANT
SLEEVE REPOSITIONING LENGTH 30 (MISCELLANEOUS) ×4 IMPLANT
SPONGE GAUZE 2X2 STER 10/PKG (GAUZE/BANDAGES/DRESSINGS) ×2
STOPCOCK MORSE 400PSI 3WAY (MISCELLANEOUS) ×8 IMPLANT
SUT GORETEX CV 4 TH 22 36 (SUTURE) ×2 IMPLANT
SUT GORETEX CV4 TH-18 (SUTURE) ×6 IMPLANT
SUT PROLENE 5 0 C 1 36 (SUTURE) ×4 IMPLANT
SUT SILK  1 MH (SUTURE) ×4
SUT SILK 1 MH (SUTURE) ×2 IMPLANT
SUT SILK 2 0 SH CR/8 (SUTURE) ×2 IMPLANT
SUT VIC AB 2-0 CT1 27 (SUTURE) ×4
SUT VIC AB 2-0 CT1 TAPERPNT 27 (SUTURE) IMPLANT
SYR 50ML LL SCALE MARK (SYRINGE) ×4 IMPLANT
SYR BULB IRRIGATION 50ML (SYRINGE) ×2 IMPLANT
SYR CONTROL 10ML LL (SYRINGE) IMPLANT
TOWEL GREEN STERILE (TOWEL DISPOSABLE) ×4 IMPLANT
TOWEL GREEN STERILE FF (TOWEL DISPOSABLE) ×4 IMPLANT
TRANSDUCER W/STOPCOCK (MISCELLANEOUS) ×8 IMPLANT
TRAY FOLEY SLVR 16FR TEMP STAT (SET/KITS/TRAYS/PACK) IMPLANT
VALVE 26 ULTRA SAPIEN KIT (Valve) ×3 IMPLANT
WIRE EMERALD 3MM-J .035X150CM (WIRE) ×4 IMPLANT
WIRE EMERALD 3MM-J .035X260CM (WIRE) ×4 IMPLANT

## 2019-12-12 NOTE — Transfer of Care (Signed)
Immediate Anesthesia Transfer of Care Note  Patient: Mark Jackson.  Procedure(s) Performed: TRANSCATHETER AORTIC VALVE REPLACEMENT, LEFT SUBCLAVIAN (Left Chest) TRANSESOPHAGEAL ECHOCARDIOGRAM (TEE) (N/A )  Patient Location: Cath Lab  Anesthesia Type:General  Level of Consciousness: awake, alert  and oriented  Airway & Oxygen Therapy: Patient Spontanous Breathing and Patient connected to nasal cannula oxygen  Post-op Assessment: Report given to RN and Post -op Vital signs reviewed and stable  Post vital signs: Reviewed and stable  Last Vitals:  Vitals Value Taken Time  BP    Temp 36.3 C 12/12/19 1405  Pulse    Resp    SpO2      Last Pain:  Vitals:   12/12/19 1405  TempSrc: Temporal  PainSc: Asleep         Complications: No apparent anesthesia complications

## 2019-12-12 NOTE — Discharge Instructions (Signed)
ACTIVITY AND EXERCISE °• Daily activity and exercise are an important part of your recovery. People recover at different rates depending on their general health and type of valve procedure. °• Most people recovering from TAVR feel better relatively quickly  °• No lifting, pushing, pulling more than 10 pounds (examples to avoid: groceries, vacuuming, gardening, golfing): °            - For one week with a procedure through the groin. °            - For six weeks for procedures through the chest wall or neck. °NOTE: You will typically see one of our providers 7-14 days after your procedure to discuss WHEN TO RESUME the above activities.  °  °  °DRIVING °• Do not drive until you are seen for follow up and cleared by a provider. Generally, we ask patient to not drive for 1 week after their procedure. °• If you have been told by your doctor in the past that you may not drive, you must talk with him/her before you begin driving again. °  °DRESSING °• Groin site: you may leave the clear dressing over the site for up to one week or until it falls off. °  °HYGIENE °• If you had a femoral (leg) procedure, you may take a shower when you return home. After the shower, pat the site dry. Do NOT use powder, oils or lotions in your groin area until the site has completely healed. °• If you had a chest procedure, you may shower when you return home unless specifically instructed not to by your discharging practitioner. °            - DO NOT scrub incision; pat dry with a towel. °            - DO NOT apply any lotions, oils, powders to the incision. °            - No tub baths / swimming for at least 2 weeks. °• If you notice any fevers, chills, increased pain, swelling, bleeding or pus, please contact your doctor. °  °ADDITIONAL INFORMATION °• If you are going to have an upcoming dental procedure, please contact our office as you will require antibiotics ahead of time to prevent infection on your heart valve.  ° ° °If you have any  questions or concerns you can call the structural heart phone during normal business hours 8am-4pm. If you have an urgent need after hours or weekends please call [phone] to talk to the on call provider for general cardiology. If you have an emergency that requires immediate attention, please call 911.  ° ° °After TAVR Checklist ° °Check  Test Description  ° Follow up appointment in 1-2 weeks  You will see our structural heart physician assistant, Katie Florabelle Cardin. Your incision sites will be checked and you will be cleared to drive and resume all normal activities if you are doing well.    ° 1 month echo and follow up  You will have an echo to check on your new heart valve and be seen back in the office by Katie Azula Zappia. Many times the echo is not read by your appointment time, but Katie will call you later that day or the following day to report your results.  ° Follow up with your primary cardiologist You will need to be seen by your primary cardiologist in the following 3-6 months after your 1 month appointment in the valve   clinic. Often times your Plavix or Aspirin will be discontinued during this time, but this is decided on a case by case basis.   ° 1 year echo and follow up You will have another echo to check on your heart valve after 1 year and be seen back in the office by Katie Lukasz Rogus. This your last structural heart visit.  ° Bacterial endocarditis prophylaxis  You will have to take antibiotics for the rest of your life before all dental procedures (even teeth cleanings) to protect your heart valve. Antibiotics are also required before some surgeries. Please check with your cardiologist before scheduling any surgeries. Also, please make sure to tell us if you have a penicillin allergy as you will require an alternative antibiotic.   ° ° °

## 2019-12-12 NOTE — Progress Notes (Signed)
  Echocardiogram Echocardiogram Transesophageal has been performed.  Darlina Sicilian M 12/12/2019, 1:37 PM

## 2019-12-12 NOTE — Progress Notes (Signed)
  [facility] VALVE TEAM  Patient doing well s/p TAVR. He is hemodynamically stable, but moderately hypertensive. Will add back home Imdur. Groin site and subclavian site are stable. ECG with sinus and no high grade block. Arterial line discontinued and transferred to 4E. Plan for early ambulation after bedrest completed and hopeful discharge over the next 24-48 hours.   Angelena Form PA-C  [facility]  Pager [phone]

## 2019-12-12 NOTE — Anesthesia Procedure Notes (Signed)
Arterial Line Insertion Start/End5/11/2019 10:00 AM, 12/12/2019 10:13 AM Performed by: Josephine Igo, CRNA, CRNA  Patient location: Pre-op. Preanesthetic checklist: patient identified, IV checked, surgical consent, monitors and equipment checked and pre-op evaluation Lidocaine 1% used for infiltration Right was placed Catheter size: 20 G Hand hygiene performed , maximum sterile barriers used  and Seldinger technique used  Attempts: 1 Procedure performed without using ultrasound guided technique. Following insertion, dressing applied and Biopatch. Post procedure assessment: decreased circulation  Patient tolerated the procedure well with no immediate complications.

## 2019-12-12 NOTE — Op Note (Signed)
[facility]   MULTIDISCIPLINARY HEART VALVE TEAM   TAVR OPERATIVE NOTE   Date of Procedure:  12/12/2019  Preoperative Diagnosis: Severe Aortic Stenosis   Postoperative Diagnosis: Same   Procedure:    Transcatheter Aortic Valve Replacement - Left subclavian Approach  Edwards Sapien 3 Ultra THV (size 26 mm, model # 9750TFX, serial # [id number])   Co-Surgeons:  Gaye Pollack, MD and Sherren Mocha, MD  Anesthesiologist:  Annye Asa, MD  Echocardiographer:  Ena Dawley, MD  Pre-operative Echo Findings:  Severe aortic stenosis  Normal left ventricular systolic function  Post-operative Echo Findings:  Trivial paravalvular leak  Unchanged left ventricular systolic function  BRIEF CLINICAL NOTE AND INDICATIONS FOR SURGERY  86 yo male with CAD who has undergone LAD stenting, atrial fibrillation, stage IV CKD, and progressive aortic stenosis, presents today for TAVR via left subclavian access. The patient has developed NYHA functional Class 2 symptoms of chronic diastolic heart failure and had a recent episode of exertional syncope. He has undergone extensive multidisciplinary team evaluation and is felt to be a candidate for TAVR.   During the course of the patient's preoperative work up they have been evaluated comprehensively by a multidisciplinary team of specialists coordinated through the [facility] in the [facility].  They have been demonstrated to suffer from symptomatic severe aortic stenosis as noted above. The patient has been counseled extensively as to the relative risks and benefits of all options for the treatment of severe aortic stenosis including long term medical therapy, conventional surgery for aortic valve replacement, and transcatheter aortic valve replacement.  The patient has been independently evaluated in formal cardiac surgical consultation by Dr Cyndia Bent, who deemed the patient  appropriate for TAVR. Based upon review of all of the patient's preoperative diagnostic tests they are felt to be candidate for transcatheter aortic valve replacement using the subclavian approach as an alternative to conventional surgery.    Following the decision to proceed with transcatheter aortic valve replacement, a discussion has been held regarding what types of management strategies would be attempted intraoperatively in the event of life-threatening complications, including whether or not the patient would be considered a candidate for the use of cardiopulmonary bypass and/or conversion to open sternotomy for attempted surgical intervention.  The patient has been advised of a variety of complications that might develop peculiar to this approach including but not limited to risks of death, stroke, paravalvular leak, aortic dissection or other major vascular complications, aortic annulus rupture, device embolization, cardiac rupture or perforation, acute myocardial infarction, arrhythmia, heart block or bradycardia requiring permanent pacemaker placement, congestive heart failure, respiratory failure, renal failure, pneumonia, infection, other late complications related to structural valve deterioration or migration, or other complications that might ultimately cause a temporary or permanent loss of functional independence or other long term morbidity.  The patient provides full informed consent for the procedure as described and all questions were answered preoperatively.  DETAILS OF THE OPERATIVE PROCEDURE  PREPARATION:   The patient is brought to the operating room on the above mentioned date and central monitoring was established by the anesthesia team including placement of a central venous catheter and radial arterial line. The patient is placed in the supine position on the operating table.  Intravenous antibiotics are administered.  General endotracheal anesthesia is induced  uneventfully.  Baseline transesophageal echocardiogram is performed. The patient's chest, abdomen, both groins, and both lower extremities are prepared and draped in a sterile manner.  A time out procedure is performed.   PERIPHERAL ACCESS:   Using ultrasound guidance, femoral arterial and venous access is obtained with placement of 6 Fr sheaths on the right side.  A pigtail diagnostic catheter was passed through the femoral arterial sheath under fluoroscopic guidance into the aortic root.  A temporary transvenous pacemaker catheter was passed through the femoral venous sheath under fluoroscopic guidance into the right ventricle.  The pacemaker was tested to ensure stable lead placement and pacemaker capture. Aortic root angiography was performed in order to determine the optimal angiographic angle for valve deployment.  TRANSAXILLARY ACCESS:  Please see the complete note of Dr Cyndia Bent for details of subclavian exposure and access.  An 8 French sheath is placed and then a pigtail catheter is advanced across the aortic valve and changed out for an Amplatz Extra Stiff wire. A 14 Fr E-sheath is advanced into the aortic root over the wire.    TRANSCATHETER HEART VALVE DEPLOYMENT:  An Edwards Sapien 3 Ultra transcatheter heart valve (size 26 mm) was prepared and crimped per manufacturer's guidelines, and the proper orientation of the valve is confirmed on the [employer] delivery system. The valve was advanced through the introducer sheath using normal technique until in an appropriate position in the ascending aorta beyond the sheath tip. The balloon was then retracted and using the fine-tuning wheel was centered on the valve.  The valve was carefully positioned across the aortic valve annulus. The Commander catheter was retracted using normal technique. Once final position of the valve has been confirmed by angiographic assessment, the valve is deployed while temporarily holding ventilation and during  rapid ventricular pacing to maintain systolic blood pressure < 50 mmHg and pulse pressure < 10 mmHg. The balloon inflation is held for >3 seconds after reaching full deployment volume. Once the balloon has fully deflated the balloon is retracted into the ascending aorta and valve function is assessed using echocardiography. The patient's hemodynamic recovery following valve deployment is good.  The deployment balloon and guidewire are both removed. Echo demostrated acceptable post-procedural gradients, stable mitral valve function, and trace aortic insufficiency.    PROCEDURE COMPLETION:  The sheath was removed and subclavianl artery closure is performed by Dr Cyndia Bent.  Protamine is administered once arterial repair was complete. The temporary pacemaker and pigtail catheters are removed. Mynx closure is used for right femoral arterial hemostasis for the 6 Fr sheath.  The patient tolerated the procedure well and is transported to the surgical intensive care in stable condition. There were no immediate intraoperative complications. All sponge instrument and needle counts are verified correct at completion of the operation.   The patient received a total of 42 mL of intravenous contrast during the procedure.   Sherren Mocha, MD 12/12/2019 1:37 PM

## 2019-12-12 NOTE — Progress Notes (Signed)
Patient took plavix today.  Katie, PA made aware.  She will let Dr. Burt Knack and Dr. Cyndia Bent know.  Dr. Glennon Mac also made aware.

## 2019-12-12 NOTE — Progress Notes (Signed)
Pt arrived to 4e from cath lab. Pt sleepy, but responds appropriately when spoken to. Vitals obtained. Telemetry box applied and CCMD notified x2 verifiers. Right groin and left chest sites clean and dry.

## 2019-12-12 NOTE — Anesthesia Procedure Notes (Signed)
Central Venous Catheter Insertion Performed by: Annye Asa, MD, anesthesiologist Start/End5/11/2019 10:22 AM, 12/12/2019 10:36 AM Patient location: Pre-op. Preanesthetic checklist: patient identified, IV checked, site marked, risks and benefits discussed, surgical consent, monitors and equipment checked, pre-op evaluation, timeout performed and anesthesia consent Position: supine Lidocaine 1% used for infiltration and patient sedated Hand hygiene performed , maximum sterile barriers used  and Seldinger technique used Catheter size: 8 Fr Central line was placed.Double lumen Procedure performed using ultrasound guided technique. Ultrasound Notes:anatomy identified, needle tip was noted to be adjacent to the nerve/plexus identified, no ultrasound evidence of intravascular and/or intraneural injection and image(s) printed for medical record Attempts: 1 Following insertion, Biopatch, dressing applied and line sutured. Post procedure assessment: blood return through all ports, free fluid flow and no air  Patient tolerated the procedure well with no immediate complications. Additional procedure comments: CVP: Timeout, sterile prep, drape, FBP R neck.  Supine position.  1% lido local, finder and trocar RIJ 1st pass with US guidance.  2 lumen placed over J wire. Biopatch and sterile dressing on.  Patient tolerated well.  VSS.  Jenita Seashore, MD.

## 2019-12-12 NOTE — Progress Notes (Signed)
Rt radial arterial line d/c'ed, pressure held x 5 minutes. Level 0, rt radial 2+ palpable. Gauze and tegaderm dressing. Rt hand and fingers warm and pink.

## 2019-12-12 NOTE — Anesthesia Postprocedure Evaluation (Signed)
Anesthesia Post Note  Patient: Mark Jackson.  Procedure(s) Performed: TRANSCATHETER AORTIC VALVE REPLACEMENT, LEFT SUBCLAVIAN (Left Chest) TRANSESOPHAGEAL ECHOCARDIOGRAM (TEE) (N/A )     Patient location during evaluation: Cath Lab Anesthesia Type: General Level of consciousness: awake and alert, oriented and patient cooperative Pain management: pain level controlled Vital Signs Assessment: post-procedure vital signs reviewed and stable Respiratory status: spontaneous breathing, nonlabored ventilation, respiratory function stable and patient connected to nasal cannula oxygen Cardiovascular status: blood pressure returned to baseline and stable Postop Assessment: no apparent nausea or vomiting Anesthetic complications: no    Last Vitals:  Vitals:   12/12/19 1520 12/12/19 1525  BP:    Pulse: (!) 165 (!) 152  Resp:    Temp:    SpO2:      Last Pain:  Vitals:   12/12/19 1439  TempSrc: Temporal  PainSc: Asleep                 Johnesha Acheampong,E. Deissy Guilbert

## 2019-12-12 NOTE — Anesthesia Preprocedure Evaluation (Addendum)
Anesthesia Evaluation  Patient identified by MRN, date of birth, ID band Patient awake    Reviewed: Allergy & Precautions, NPO status , Patient's Chart, lab work & pertinent test results  History of Anesthesia Complications Negative for: history of anesthetic complications  Airway Mallampati: II  TM Distance: >3 FB Neck ROM: Full    Dental  (+) Missing, Dental Advisory Given   Pulmonary shortness of breath and with exertion, former smoker,    breath sounds clear to auscultation       Cardiovascular hypertension, Pt. on medications + angina + CAD, + Cardiac Stents and + Peripheral Vascular Disease  + Valvular Problems/Murmurs (severe) AS  Rhythm:Regular Rate:Normal + Systolic murmurs 3/83/2919 ECHO: EF 60-65%, mod AS with mean grad 27 mmHg, mild AI   Neuro/Psych  Headaches, Anxiety glaucoma    GI/Hepatic negative GI ROS, Neg liver ROS,   Endo/Other  negative endocrine ROS  Renal/GU Renal InsufficiencyRenal disease (creat 2.62)   H/o prostate cancer    Musculoskeletal  (+) Arthritis ,   Abdominal   Peds  Hematology  (+) Blood dyscrasia (Hb 10.8), anemia , Eliquis, plavix   Anesthesia Other Findings   Reproductive/Obstetrics                            Anesthesia Physical Anesthesia Plan  ASA: III  Anesthesia Plan: General   Post-op Pain Management:    Induction: Intravenous  PONV Risk Score and Plan: 2 and Ondansetron, Dexamethasone and Treatment may vary due to age or medical condition  Airway Management Planned: Oral ETT  Additional Equipment: Arterial line, CVP and Ultrasound Guidance Line Placement  Intra-op Plan:   Post-operative Plan: Extubation in OR  Informed Consent: I have reviewed the patients History and Physical, chart, labs and discussed the procedure including the risks, benefits and alternatives for the proposed anesthesia with the patient or authorized  representative who has indicated his/her understanding and acceptance.     Dental advisory given  Plan Discussed with: CRNA and Surgeon  Anesthesia Plan Comments:        Anesthesia Quick Evaluation

## 2019-12-12 NOTE — Op Note (Signed)
[facility]   MULTIDISCIPLINARY HEART VALVE TEAM   TAVR OPERATIVE NOTE   Date of Procedure:  12/12/2019  Preoperative Diagnosis: Severe Aortic Stenosis   Postoperative Diagnosis: Same   Procedure:   Transcatheter Aortic Valve Replacement - Left Subclavian artery approach Edwards Sapien 3 Ultra THV (size 26 mm, model # 9750TFX, serial # [id number])   Co-Surgeons:  Gaye Pollack, MD and Sherren Mocha, MD   Anesthesiologist:  Annye Asa, MD  Echocardiographer:  Ena Dawley, MD  Pre-operative Echo Findings:  Severe aortic stenosis  Normal left ventricular systolic function  Post-operative Echo Findings:  No paravalvular leak  Normal left ventricular systolic function   BRIEF CLINICAL NOTE AND INDICATIONS FOR SURGERY  This 86 year old gentleman has stage D, severe, symptomatic aortic stenosis with New York Heart Association class II symptoms of exertional fatigue and shortness of breath consistent with chronic diastolic congestive heart failure. He presented with exertional dizziness and syncope. He never had any documented heart block on the monitor or on any of his EKGs. He has a history of severe single-vessel coronary disease with prior stenting of the ostial and proximal LAD in 2016 and 2017. His most recent catheterization in September 2020 showed widely patent stents in the ostial and proximal LAD with otherwise normal coronary arteries. There was moderate to severe aortic stenosis with a mean gradient of 31 mmHg and a peak gradient of 36 mmHg. I have personally reviewed his 2D echocardiogram, cardiac catheterization, and CTA studies. The echo from 11/28/2019 had poor visualization of the aortic valve but the mean gradient was measured at 26.5 mmHg. Peak gradient was 45.8 mmHg with a valve area of 1.09 cm. The 2D echo on 11/02/2019 showed much better detail of the aortic valve and it was a trileaflet aortic valve that was heavily calcified and had  restricted mobility. This looks like a severely stenotic valve despite a mean gradient of only 26.5 mmHg. I suspect the patient has low gradient, normal ejection fraction severe aortic stenosis given his symptoms. I agree that aortic valve replacement is indicated in this patient for improvement of his symptoms and prevent progressive left ventricular deterioration. Given his advanced age I think transcatheter aortic valve replacement be the best treatment for him. His gated cardiac CTA shows anatomy amenable to transcatheter aortic valve replacement using a SAPIEN 3 valve. His abdominal and pelvic CTA shows diffuse aortoiliac vascular disease with an ulcerated plaque in the infrarenal abdominal aorta and an adjacent vascular web that looks like a focal aortic dissection with calcifications within the web. He has significant laminated thrombus within the abdominal aorta. I think these findings would increase his risk of vascular complications with transfemoral approach. His left subclavian artery appears adequate for access with a widely patent origin from the aortic arch. I think this is probably the best option for access.  The patient and his daughter were counseled at length regarding treatment alternatives for management of severe symptomatic aortic stenosis. The risks and benefits of surgical intervention has been discussed in detail. Long-term prognosis with medical therapy was discussed. Alternative approaches such as conventional surgical aortic valve replacement, transcatheter aortic valve replacement, and palliative medical therapy were compared and contrasted at length. This discussion was placed in the context of the patient's own specific clinical presentation and past medical history. All of their questions have been addressed.  Following the decision to proceed with transcatheter aortic valve replacement, a discussion was held regarding what types of management strategies would be attempted  intraoperatively in the event of life-threatening complications, including whether or not the patient would be considered a candidate for the use of cardiopulmonary bypass and/or conversion to open sternotomy for attempted surgical intervention. The patient is aware of the fact that transient use of cardiopulmonary bypass may be necessary. He is an 86 year old gentleman with stage IV chronic kidney disease and would not have a good outcome with emergent sternotomy to manage any intraoperative complications although I would consider it depending on the situation. The patient has been advised of a variety of complications that might develop including but not limited to risks of death, stroke, paravalvular leak, aortic dissection or other major vascular complications, aortic annulus rupture, device embolization, cardiac rupture or perforation, mitral regurgitation, acute myocardial infarction, arrhythmia, heart block or bradycardia requiring permanent pacemaker placement, congestive heart failure, respiratory failure, renal failure, pneumonia, infection, other late complications related to structural valve deterioration or migration, or other complications that might ultimately cause a temporary or permanent loss of functional independence or other long term morbidity. The patient provides full informed consent for the procedure as described and all questions were answered.     DETAILS OF THE OPERATIVE PROCEDURE  PREPARATION:    The patient was brought to the operating room on the above mentioned date and appropriate monitoring was established by the anesthesia team. The patient was placed in the supine position on the operating table.  Intravenous antibiotics were administered. General endotracheal anesthesia was induced uneventfully.  A Foley catheter was placed.  Baseline transesophageal echocardiogram was performed. The patient's abdomen and both groins were prepped and draped in a sterile manner. A time  out procedure was performed.   PERIPHERAL ACCESS:    Using the modified Seldinger technique, femoral arterial and venous access was obtained with placement of 6 Fr sheaths on the right side.  A pigtail diagnostic catheter was passed through the right arterial sheath under fluoroscopic guidance into the aortic root.  A temporary transvenous pacemaker catheter was passed through the right femoral venous sheath under fluoroscopic guidance into the right ventricle.  The pacemaker was tested to ensure stable lead placement and pacemaker capture. Aortic root angiography was performed in order to determine the optimal angiographic angle for valve deployment.  LEFT SUBCLAVIAN ACCESS:   A transverse incision was made below the left clavicle and carried down through the subcutaneous tissue using electrocautery. The pectoralis major muscle was split along its fibers and the pectoralis minor muscle retracted laterally. The left axillary artery was identified and encircled with a vessel loop. The patient was heparinized systemically and ACT verified > 250 seconds.  A double concentric purse string suture of CV-4 gortex was placed in the anterior wall of the artery. The artery was cannulated with a needle and a J- wire advanced into the ascending aorta. An 8 F sheath was inserted over the wire. The aortic valve was crossed with a JR4 catheter and a straight wire. This was exchanged for a pigtail catheter and position was confirmed in the LV apex. Simultaneous LV and Ao pressures were recorded.  The pigtail catheter was exchanged for an Amplatz Extra-stiff wire in the LV apex.  Then a 78F E-sheath was inserted into the axillary artery and the tip advanced into the aortic arch.  BALLOON AORTIC VALVULOPLASTY:   Not performed.  TRANSCATHETER HEART VALVE DEPLOYMENT:   An Edwards Sapien 3 Ultra transcatheter heart valve (size 26 mm, model #9750TFX, serial #[id number]) was prepared and crimped per manufacturer's  guidelines, and the proper  orientation of the valve is confirmed on the [employer] delivery system. The valve was advanced through the introducer sheath using normal technique until in an appropriate position in the ascending aorta beyond the sheath tip. The balloon was then retracted and using the fine-tuning wheel was centered on the valve. The valve was then advanced across the aortic arch using appropriate flexion of the catheter. The valve was carefully positioned across the aortic valve annulus. The Commander catheter was retracted using normal technique. Once final position of the valve has been confirmed by angiographic assessment, the valve is deployed while temporarily holding ventilation and during rapid ventricular pacing to maintain systolic blood pressure < 50 mmHg and pulse pressure < 10 mmHg. The balloon inflation is held for >3 seconds after reaching full deployment volume. Once the balloon has fully deflated the balloon is retracted into the ascending aorta and valve function is assessed using echocardiography. There is felt to be no paravalvular leak and no central aortic insufficiency. Post-procedural gradients were acceptable. The patient's hemodynamic recovery following valve deployment is good.  The deployment balloon and guidewire are both removed.    PROCEDURE COMPLETION:   The sheath was removed left subclavian artery closure performed using the previously placed sutures.  Protamine was administered once subclavian arterial repair was complete. The temporary pacemaker, pigtail catheters and femoral sheaths were removed with manual pressure used for hemostasis.  A Mynx femoral closure device was utilized following removal of the diagnostic sheath in the right femoral artery.  The left infraclavicular incision was closed using 2-0 Vicryl continuous suture to approximate the pectoralis muscle, 3-0 Vicryl continuous suture for the subcutaneous suture and 3-0 Vicryl subcuticular  closure for the skin. Dermabond was applied.  The patient tolerated the procedure well and was transported to the cath lab recovery area in stable condition. There were no immediate intraoperative complications. All sponge instrument and needle counts are verified correct at completion of the operation.   No blood products were administered during the operation.  The patient received a total of 42 mL of intravenous contrast during the procedure.   Gaye Pollack, MD 12/12/2019 6:37 PM

## 2019-12-12 NOTE — Anesthesia Procedure Notes (Signed)
Procedure Name: Intubation Date/Time: 12/12/2019 11:52 AM Performed by: Mariea Clonts, CRNA Pre-anesthesia Checklist: Patient identified, Emergency Drugs available, Suction available and Patient being monitored Patient Re-evaluated:Patient Re-evaluated prior to induction Oxygen Delivery Method: Circle System Utilized Preoxygenation: Pre-oxygenation with 100% oxygen Induction Type: IV induction Ventilation: Mask ventilation without difficulty Laryngoscope Size: Miller and 2 Grade View: Grade I Tube type: Oral Tube size: 7.5 mm Number of attempts: 1 Airway Equipment and Method: Stylet and Oral airway Placement Confirmation: ETT inserted through vocal cords under direct vision,  positive ETCO2 and breath sounds checked- equal and bilateral Tube secured with: Tape Dental Injury: Teeth and Oropharynx as per pre-operative assessment

## 2019-12-12 NOTE — Interval H&P Note (Signed)
History and Physical Interval Note:  12/12/2019 10:23 AM  Mark Jackson.  has presented today for surgery, with the diagnosis of Severe Aortic Stenosis.  The various methods of treatment have been discussed with the patient and family. After consideration of risks, benefits and other options for treatment, the patient has consented to  Procedure(s): TRANSCATHETER AORTIC VALVE REPLACEMENT, LEFT SUBCLAVIAN (Left) TRANSESOPHAGEAL ECHOCARDIOGRAM (TEE) (N/A) as a surgical intervention.  The patient's history has been reviewed, patient examined, no change in status, stable for surgery.  I have reviewed the patient's chart and labs.  Questions were answered to the patient's satisfaction.     Gaye Pollack

## 2019-12-13 ENCOUNTER — Inpatient Hospital Stay (HOSPITAL_COMMUNITY): Payer: Medicare Other

## 2019-12-13 ENCOUNTER — Encounter: Payer: Self-pay | Admitting: Physician Assistant

## 2019-12-13 DIAGNOSIS — Z952 Presence of prosthetic heart valve: Secondary | ICD-10-CM

## 2019-12-13 DIAGNOSIS — Z954 Presence of other heart-valve replacement: Secondary | ICD-10-CM | POA: Diagnosis not present

## 2019-12-13 DIAGNOSIS — I350 Nonrheumatic aortic (valve) stenosis: Secondary | ICD-10-CM | POA: Diagnosis not present

## 2019-12-13 LAB — CBC
HCT: 30.2 % — ABNORMAL LOW (ref 39.0–52.0)
Hemoglobin: 9.7 g/dL — ABNORMAL LOW (ref 13.0–17.0)
MCH: 33.7 pg (ref 26.0–34.0)
MCHC: 32.1 g/dL (ref 30.0–36.0)
MCV: 104.9 fL — ABNORMAL HIGH (ref 80.0–100.0)
Platelets: 190 10*3/uL (ref 150–400)
RBC: 2.88 MIL/uL — ABNORMAL LOW (ref 4.22–5.81)
RDW: 13.2 % (ref 11.5–15.5)
WBC: 13.6 10*3/uL — ABNORMAL HIGH (ref 4.0–10.5)
nRBC: 0 % (ref 0.0–0.2)

## 2019-12-13 LAB — ECHOCARDIOGRAM COMPLETE
Height: 68 in
Weight: 2984.15 oz

## 2019-12-13 LAB — BASIC METABOLIC PANEL
Anion gap: 8 (ref 5–15)
BUN: 45 mg/dL — ABNORMAL HIGH (ref 8–23)
CO2: 19 mmol/L — ABNORMAL LOW (ref 22–32)
Calcium: 7.9 mg/dL — ABNORMAL LOW (ref 8.9–10.3)
Chloride: 111 mmol/L (ref 98–111)
Creatinine, Ser: 2.6 mg/dL — ABNORMAL HIGH (ref 0.61–1.24)
GFR calc Af Amer: 25 mL/min — ABNORMAL LOW (ref >60)
GFR calc non Af Amer: 21 mL/min — ABNORMAL LOW (ref >60)
Glucose, Bld: 193 mg/dL — ABNORMAL HIGH (ref 70–99)
Potassium: 4.3 mmol/L (ref 3.5–5.1)
Sodium: 138 mmol/L (ref 135–145)

## 2019-12-13 LAB — MAGNESIUM: Magnesium: 2 mg/dL (ref 1.7–2.4)

## 2019-12-13 NOTE — Progress Notes (Signed)
Pt discharging home with family. Pt and pt's daughter received discharge instructions and all questions were answered. IV and telemetry box removed. Pt leaving with all of his belongings. Pt to be discharged via wheelchair.

## 2019-12-13 NOTE — Progress Notes (Signed)
Right IJ removed, per order. Pressure held and dressing applied. No bleeding noted. Pt instructed to stay on bedrest for 30 minutes.

## 2019-12-13 NOTE — Discharge Summary (Signed)
[place] VALVE TEAM  Discharge Summary    Patient ID: Mark Jackson. MRN: [id number]; DOB: [birth year]  Admit date: 12/12/2019 Discharge date: 12/13/2019  Primary Care Provider: [name], MD  Primary Cardiologist: [name], MD / Dr. Burt Knack & Dr. Cyndia Bent (TAVR)  Discharge Diagnoses    Principal Problem:   S/P TAVR (transcatheter aortic valve replacement) Active Problems:   Anemia   Essential hypertension   Sleep apnea   Personal history of prostate cancer   Right-sided carotid artery disease; followed by Dr. Ellyn Hack   Hyperlipidemia with target LDL less than 70   Paroxysmal atrial fibrillation ([place]); CHA2DSVasc - 4; Now on Eliquis   Chronic kidney disease (CKD), active medical management without dialysis, stage 4 (severe) ([place])   CAD S/P DES PCI to proximal LAD   Severe aortic stenosis   Acute on chronic diastolic heart failure (HCC)   Allergies No Known Allergies  Diagnostic Studies/Procedures    TAVR OPERATIVE NOTE   Date of Procedure:                12/12/2019  Preoperative Diagnosis:      Severe Aortic Stenosis   Postoperative Diagnosis:    Same   Procedure:       Transcatheter Aortic Valve Replacement - Left Subclavian artery approach   Edwards Sapien 3 Ultra THV (size 26 mm, model # 9750TFX, serial # [id number])              Co-Surgeons:                        Gaye Pollack, MD and Sherren Mocha, MD   Anesthesiologist:                  Annye Asa, MD  Echocardiographer:              Ena Dawley, MD  Pre-operative Echo Findings: ? Severe aortic stenosis ? Normal left ventricular systolic function  Post-operative Echo Findings: ? No paravalvular leak ? Normal left ventricular systolic function  _____________   Echo 12/13/19: complete but pending formal read at the time of discharge    History of Present Illness     Mark Arizpe. is a 86 y.o. male with a history of  prostate CA s/p seed radiation, CAD s/p overlapping DES to ostial and proximal LAD,anemia,carotid artery disease (60-79% RICA stenosis), HTN, HLD, CKD stage IV, PAF on Eliquis, and severe paradoxical LFLG AS  He has a history of CAD s/p DES to the proximal LAD and a 2016 and stenting of the ostial and proximal LAD with overlapping DES in 05/2016, and aortic stenosis.  His most recent cath in 04/2019 showed widely patent LAD stents with moderate to severe aortic stenosis with a mean gradient of 31 mmHg and a peak gradient of 36 mmHg. He was in his usual state of health with some exertional fatigue and shortness of breath until 11/27/2019 when he had an episode of exertional syncope while carrying laundry up the stairs. He said that he felt short of breath while carrying laundry up the stairs but rested for seconds and continue to walk. He felt like he was going to pass out and went down to his knees and then lost consciousness. He does not know how long he passed out for. He presented to [facility] and had a high-sensitivity troponin of 10, BNP of 139, and  creatinine of 2.81 which was mildly increased from his previous value of 2.32.  He was transferred to [facility] for further evaluation had a repeat echocardiogram on 11/28/2019. This showed a left ventricular ejection fraction of 65%.  The mean gradient across aortic valve was 26.5 mmHg with a peak of 45.8 mmHg.  Aortic valve area was 1.09 cm with a dimensionless index of 0.26. There is mild aortic insufficiency. The structural heart team was consulted who felt he had LFLG severe AS. CT scans showed suitable anatomy for a TAVR valve but he had an ulcerated plaque with vascular web in the infrarenal abdominal aorta and so it was decided to proceed with a subclavian approach.  The patient has been evaluated by the multidisciplinary valve team and felt to have severe, symptomatic aortic stenosis and to be a suitable candidate for TAVR, which was set up  for 12/12/19.   Hospital Course     Consultants: none  Severe AS: s/p successful TAVR with a 26 mm Edwards Sapien 3 Ultra THV via the left subclavian approach on 12/12/19. Post operative echo completed but pending formal read. Groin sites are stable. ECG with new 1st deg AV block but no HAVB. Continued on home plavix and will resume Eliquis 2.5mg  tonight. Plan for discharge home today with close follow up in the office next week.  Acute on chronic diastolic CHF: as evidenced by an elevated BNP on pre admission lab work. This has been treated with TAVR. He will be resumed on home lasix 20mg  daily.   CKD stage IV: creat has remained stable ~2.6.   Chronic anemia: Hg stable around baseline ~9.   CAD: last cath in 04/2019 showed widely patent stents in LAD. Cath was not repeated prior to TAVR given lack of anginal symptoms and advanced kidney disease. Continue medical therapy.  _____________  Discharge Vitals Blood pressure 115/63, pulse 79, temperature 97.9 F (36.6 C), temperature source Oral, resp. rate 16, height 5\' 8"  (1.727 m), weight 84.6 kg, SpO2 99 %.  Filed Weights   12/12/19 0854 12/12/19 0913 12/13/19 0645  Weight: 84.9 kg 84.9 kg 84.6 kg    Labs & Radiologic Studies    CBC Recent Labs    12/12/19 1417 12/13/19 0500  WBC  --  13.6*  HGB 9.2* 9.7*  HCT 27.0* 30.2*  MCV  --  104.9*  PLT  --  923   Basic Metabolic Panel Recent Labs    12/12/19 1417 12/13/19 0500  NA 141 138  K 4.5 4.3  CL 113* 111  CO2  --  19*  GLUCOSE 103* 193*  BUN 37* 45*  CREATININE 2.50* 2.60*  CALCIUM  --  7.9*  MG  --  2.0   Liver Function Tests No results for input(s): AST, ALT, ALKPHOS, BILITOT, PROT, ALBUMIN in the last 72 hours. No results for input(s): LIPASE, AMYLASE in the last 72 hours. Cardiac Enzymes No results for input(s): CKTOTAL, CKMB, CKMBINDEX, TROPONINI in the last 72 hours. BNP Invalid input(s): POCBNP D-Dimer No results for input(s): DDIMER in the last 72  hours. Hemoglobin A1C No results for input(s): HGBA1C in the last 72 hours. Fasting Lipid Panel No results for input(s): CHOL, HDL, LDLCALC, TRIG, CHOLHDL, LDLDIRECT in the last 72 hours. Thyroid Function Tests No results for input(s): TSH, T4TOTAL, T3FREE, THYROIDAB in the last 72 hours.  Invalid input(s): FREET3 _____________  DG Chest 2 View  Result Date: 12/08/2019 CLINICAL DATA:  Preoperative evaluation for AVR EXAM: CHEST - 2  VIEW COMPARISON:  08/29/2019 FINDINGS: Normal heart size, mediastinal contours, and pulmonary vascularity. Coronary arterial stent noted. Atherosclerotic calcification aorta. Linear scarring RIGHT base. Lungs otherwise clear. No infiltrate, pleural effusion or pneumothorax. Osseous structures unremarkable. IMPRESSION: No acute abnormalities. Linear scarring RIGHT base. Electronically Signed   By: Lavonia Dana M.D.   On: 12/08/2019 13:20   DG Chest 2 View  Result Date: 11/27/2019 CLINICAL DATA:  Syncopal episode last night. EXAM: CHEST - 2 VIEW COMPARISON:  03/19/2015 FINDINGS: Normal sized heart. Clear lungs with normal vascularity. Minimal peribronchial thickening without significant change. Mild lower thoracic spine degenerative changes. IMPRESSION: No acute abnormality. Stable minimal chronic bronchitic changes. Electronically Signed   By: Claudie Revering M.D.   On: 11/27/2019 18:08   DG Lumbar Spine Complete  Result Date: 11/27/2019 CLINICAL DATA:  Low back pain following a syncopal episode last night. EXAM: LUMBAR SPINE - COMPLETE 4+ VIEW COMPARISON:  Abdomen and pelvis CT dated 05/23/2009. FINDINGS: Five non-rib-bearing lumbar vertebrae. Degenerative changes throughout the lumbar and lower thoracic spine with moderate posterior spur formation at the L5-S1 level with progression and mild to moderate posterior spur formation at the L2-3 level with mild progression. Stable mild retrolisthesis at the L2-3 and L3-4 levels. Minimal retrolisthesis at the L1-2 level without  significant change. No fractures, pars defects or acute subluxations. Atheromatous arterial calcifications without visible aneurysm. IMPRESSION: 1. No fracture or subluxation. 2. Multilevel degenerative changes, as described above. Electronically Signed   By: Claudie Revering M.D.   On: 11/27/2019 18:10   CT Head Wo Contrast  Result Date: 11/27/2019 CLINICAL DATA:  Syncope EXAM: CT HEAD WITHOUT CONTRAST TECHNIQUE: Contiguous axial images were obtained from the base of the skull through the vertex without intravenous contrast. COMPARISON:  01/15/2014 FINDINGS: Brain: No evidence of acute infarction, hemorrhage, hydrocephalus, extra-axial collection or mass lesion/mass effect. Mild periventricular and deep white matter hypodensity. Redemonstrated encephalomalacia of the left temporal pole versus arachnoid cyst of the left middle cranial fossa. Vascular: No hyperdense vessel or unexpected calcification. Skull: Normal. Negative for fracture or focal lesion. Sinuses/Orbits: No acute finding. Other: None. IMPRESSION: 1.  No acute intracranial pathology. 2.  Small-vessel white matter disease. 3. Redemonstrated encephalomalacia of the left temporal pole versus arachnoid cyst of the left middle cranial fossa, as seen on prior examination dated 2015. Electronically Signed   By: Eddie Candle M.D.   On: 11/27/2019 17:45   CT CORONARY MORPH W/CTA COR W/SCORE W/CA W/CM &/OR WO/CM  Addendum Date: 11/29/2019   ADDENDUM REPORT: 11/29/2019 23:09 CLINICAL DATA:  86 year old male with severe aortic stenosis being evaluated for a TAVR procedure. EXAM: Cardiac TAVR CT TECHNIQUE: The patient was scanned on a [employer]. A 120 kV retrospective scan was triggered in the descending thoracic aorta at 111 HU's. Gantry rotation speed was 250 msecs and collimation was .6 mm. No beta blockade or nitro were given. The 3D data set was reconstructed in 5% intervals of the R-R cycle. Systolic and diastolic phases were analyzed on a  dedicated work station using MPR, MIP and VRT modes. The patient received 60 cc of contrast. FINDINGS: Aortic Valve: Trileaflet, with severely thickened and calcified leaflets and no calcifications extending into the LVOT. Aorta: Normal size, very mild diffuse atherosclerotic plaque and calcifications. No dissection. Sinotubular Junction: 29 x 28 mm Ascending Thoracic Aorta: 35 x 34 mm Aortic Arch: 26 x 25 mm Descending Thoracic Aorta: 24 x 23 mm Sinus of Valsalva Measurements: Non-coronary: 33 mm Right -coronary: 33 mm  Left -coronary: 33 mm Coronary Artery Height above Annulus: Left Main: 13.6 mm Right Coronary: 18.3 mm Virtual Basal Annulus Measurements: Maximum/Minimum Diameter: 29.9 x 22.8 mm Mean Diameter: 25.3 mm Perimeter: 82.5 mm Area: 505 mm2 Optimum Fluoroscopic Angle for Delivery: LAO 11 CAU 8 IMPRESSION: 1. Trileaflet, with severely thickened and calcified leaflets and no calcifications extending into the LVOT. Aortic valve calcium score 2255 consistent with severe aortic stenosis. Annular measurements suitable for delivery of a 26 mm Edwards-SAPIEN 3 Ultra valve. 2. Sufficient coronary to annulus distance. 3. Optimum Fluoroscopic Angle for Delivery:  LAO 11 CAU 8. 4. No thrombus in the left atrial appendage. Electronically Signed   By: Ena Dawley   On: 11/29/2019 23:09   Result Date: 11/29/2019 EXAM: OVER-READ INTERPRETATION  CT CHEST The following report is an over-read performed by radiologist Dr. Vinnie Langton of [facility], [place] on 11/29/2019. This over-read does not include interpretation of cardiac or coronary anatomy or pathology. The coronary calcium score/coronary CTA interpretation by the cardiologist is attached. COMPARISON:  None. FINDINGS: Extracardiac findings will be described separately under dictation for contemporaneously obtained CTA chest, abdomen and pelvis dated 11/29/2019. IMPRESSION: Please see separate dictation for contemporaneously obtained CTA chest, abdomen  and pelvis 11/29/2019 for full description of relevant extracardiac findings. Electronically Signed: By: Vinnie Langton M.D. On: 11/29/2019 16:13   DG Chest Port 1 View  Result Date: 12/12/2019 CLINICAL DATA:  Status post TAVR. EXAM: PORTABLE CHEST 1 VIEW COMPARISON:  12/08/2019 FINDINGS: The heart size is stable. The patient is now status post TAVR. There is a well-positioned right-sided central venous catheter with tip projecting over the cavoatrial junction. There is no pneumothorax. No significant pleural effusion. There may be some mild bibasilar atelectasis. IMPRESSION: 1. Lines and tubes as above. 2. No pneumothorax. 3. Mild bibasilar atelectasis. Electronically Signed   By: Constance Holster M.D.   On: 12/12/2019 16:31   DG Knee Complete 4 Views Left  Result Date: 11/27/2019 CLINICAL DATA:  Diffuse right knee pain following a syncopal episode last night. EXAM: LEFT KNEE - COMPLETE 4+ VIEW COMPARISON:  None. FINDINGS: Extensive medial and lateral meniscal calcifications. Similar appearing calcifications adjacent to the superior aspect of the patella posteriorly. No fracture, dislocation or effusion seen. IMPRESSION: 1. No fracture or effusion. 2. Extensive chondrocalcinosis. Electronically Signed   By: Claudie Revering M.D.   On: 11/27/2019 18:12   CT ANGIO CHEST AORTA W/CM & OR WO/CM  Result Date: 11/30/2019 CLINICAL DATA:  86 year old male with history of severe aortic stenosis. Preprocedural study prior to potential transcatheter aortic valve replacement (TAVR). EXAM: CT ANGIOGRAPHY CHEST, ABDOMEN AND PELVIS TECHNIQUE: Non-contrast CT of the chest was initially obtained. Multidetector CT imaging through the chest, abdomen and pelvis was performed using the standard protocol during bolus administration of intravenous contrast. Multiplanar reconstructed images and MIPs were obtained and reviewed to evaluate the vascular anatomy. CONTRAST:  7mL OMNIPAQUE IOHEXOL 350 MG/ML SOLN COMPARISON:  No  priors. FINDINGS: CTA CHEST FINDINGS Cardiovascular: Heart size is borderline enlarged. There is no significant pericardial fluid, thickening or pericardial calcification. Left anterior descending coronary artery stent. Aortic atherosclerosis. Severe thickening calcification of the aortic valve. Mild calcifications of the mitral annulus. Mediastinum/Lymph Nodes: No pathologically enlarged mediastinal or hilar lymph nodes. Esophagus is unremarkable in appearance. No axillary lymphadenopathy. Lungs/Pleura: No acute consolidative airspace disease. No pleural effusions. No suspicious appearing pulmonary nodules or masses are noted. Musculoskeletal/Soft Tissues: There are no aggressive appearing lytic or blastic lesions noted in the visualized  portions of the skeleton. CTA ABDOMEN AND PELVIS FINDINGS Hepatobiliary: No suspicious cystic or solid hepatic lesions. No intra or extrahepatic biliary ductal dilatation. Gallbladder is normal in appearance. Pancreas: No pancreatic mass. No pancreatic ductal dilatation. No pancreatic or peripancreatic fluid collections or inflammatory changes. Spleen: Unremarkable. Adrenals/Urinary Tract: Mild atrophy of the kidneys bilaterally. No suspicious renal lesions. No hydroureteronephrosis. Bilateral adrenal glands are normal in appearance. Urinary bladder is normal in appearance. Stomach/Bowel: The appearance of the stomach is normal. No pathologic dilatation of small bowel or colon. Numerous colonic diverticulae are noted, particularly in the sigmoid colon, without surrounding inflammatory changes to suggest an acute diverticulitis at this time. The appendix is not confidently identified and may be surgically absent. Regardless, there are no inflammatory changes noted adjacent to the cecum to suggest the presence of an acute appendicitis at this time. Vascular/Lymphatic: Aortic atherosclerosis, with vascular findings and measurements pertinent to potential TAVR procedure, as detailed  below. In addition, there is an ulcerated plaque with vascular web in the infrarenal abdominal aorta best appreciated on axial image 144 of series 15. No lymphadenopathy noted in the abdomen or pelvis. Reproductive: Numerous brachytherapy implants are noted throughout the prostate gland. Seminal vesicles are unremarkable in appearance. Other: No significant volume of ascites.  No pneumoperitoneum. Musculoskeletal: There are no aggressive appearing lytic or blastic lesions noted in the visualized portions of the skeleton. VASCULAR MEASUREMENTS PERTINENT TO TAVR: AORTA: Minimal Aortic Diameter-11 x 11 mm Severity of Aortic Calcification-severe RIGHT PELVIS: Right Common Iliac Artery - Minimal Diameter-7.9 x 6.2 mm Tortuosity-moderate Calcification-moderate Right External Iliac Artery - Minimal Diameter-8.4 x 8.7 mm Tortuosity-moderate to severe Calcification-none Right Common Femoral Artery - Minimal Diameter-9.3 x 9.3 mm Tortuosity-mild Calcification-none LEFT PELVIS: Left Common Iliac Artery - Minimal Diameter-8.3 x 7.7 mm Tortuosity-severe Calcification-moderate Left External Iliac Artery - Minimal Diameter-7.7 x 6.7 mm Tortuosity-moderate Calcification-minimal Left Common Femoral Artery - Minimal Diameter-7.5 x 7.4 mm Tortuosity-mild Calcification-none Review of the MIP images confirms the above findings. IMPRESSION: 1. Vascular findings and measurements pertinent to potential TAVR procedure, as detailed above. 2. Severe thickening calcification of the aortic valve, compatible with reported clinical history of severe aortic stenosis. 3. Mild atrophy of the kidneys bilaterally. 4. Mild colonic diverticulosis without evidence of acute diverticulitis at this time. 5. Additional incidental findings, as above. Electronically Signed   By: Vinnie Langton M.D.   On: 11/30/2019 08:24   ECHO TEE  Result Date: 12/12/2019    TRANSESOPHOGEAL ECHO REPORT   Patient Name:   Caydan Mctavish. Date of Exam: 12/12/2019 Medical  Rec #:  [id number]          Height:       68.0 in Accession #:    [id number]         Weight:       187.2 lb Date of Birth:  12/05/1929         BSA:          1.987 m Patient Age:    86 years           BP:           188/79 mmHg Patient Gender: M                  HR:           80 bpm. Exam Location:  Inpatient Procedure: Transesophageal Echo, Color Doppler and Cardiac Doppler Indications:     Aortic stenosis 424.1 / I35.0  History:  Patient has prior history of Echocardiogram examinations, most                  recent 11/28/2019. CAD, Arrythmias:Atrial Fibrillation,                  Signs/Symptoms:Shortness of Breath; Risk Factors:Hypertension                  and Dyslipidemia. Chronic kidney disease, moderate carotid                  artery disease, anemia.                  Aortic Valve: 26 mm valve is present in the aortic position.                  Procedure Date: 12/12/2019.  Sonographer:     Darlina Sicilian RDCS Referring Phys:  [place] Diagnosing Phys: Ena Dawley MD PROCEDURE: The transesophogeal probe was passed without difficulty through the esophogus of the patient. Sedation performed by different physician. The patient's vital signs; including heart rate, blood pressure, and oxygen saturation; remained stable throughout the procedure. The patient developed no complications during the procedure. IMPRESSIONS  1. This was a periprocedural TEE during a TAVR procedure. A 26 mmEdwards SAPIEN 3 Ultra valve was delivered successfully via subclavian access. Peak/mean transaortic gradients improved from peak/mean 45/26 mmHg to 5/2 mmHg. There was trivial paravalvular leak and trivial central regurgitation post procedure. No pericardial effusion was seen during and post procedure.  2. Left ventricular ejection fraction, by estimation, is 60 to 65%. The left ventricle has normal function. The left ventricle has no regional wall motion abnormalities. Left ventricular diastolic function could not be  evaluated.  3. Right ventricular systolic function is normal. The right ventricular size is normal.  4. Left atrial size was mild to moderately dilated. No left atrial/left atrial appendage thrombus was detected.  5. The mitral valve is normal in structure. Mild to moderate mitral valve regurgitation. No evidence of mitral stenosis.  6. The aortic valve is normal in structure. Aortic valve regurgitation is mild. Moderate to severe aortic valve stenosis. There is a 26 mm valve present in the aortic position. Procedure Date: 12/12/2019. Aortic valve mean gradient measures 26.0 mmHg.  7. There is mild (Grade II) plaque.  8. The inferior vena cava is normal in size with greater than 50% respiratory variability, suggesting right atrial pressure of 3 mmHg. Conclusion(s)/Recommendation(s): Normal biventricular function without evidence of hemodynamically significant valvular heart disease. FINDINGS  Left Ventricle: Left ventricular ejection fraction, by estimation, is 60 to 65%. The left ventricle has normal function. The left ventricle has no regional wall motion abnormalities. The left ventricular internal cavity size was normal in size. There is  no left ventricular hypertrophy. Left ventricular diastolic function could not be evaluated. Right Ventricle: The right ventricular size is normal. No increase in right ventricular wall thickness. Right ventricular systolic function is normal. Left Atrium: Left atrial size was mild to moderately dilated. No left atrial/left atrial appendage thrombus was detected. Right Atrium: Right atrial size was normal in size. Pericardium: There is no evidence of pericardial effusion. Mitral Valve: The mitral valve is normal in structure. Normal mobility of the mitral valve leaflets. Mild to moderate mitral valve regurgitation. No evidence of mitral valve stenosis. Tricuspid Valve: The tricuspid valve is normal in structure. Tricuspid valve regurgitation is mild . No evidence of tricuspid  stenosis. Aortic Valve: The aortic valve  is normal in structure. Aortic valve regurgitation is mild. Moderate to severe aortic stenosis is present. There is severe calcifcation of the aortic valve. Aortic valve mean gradient measures 26.0 mmHg. Aortic valve peak gradient measures 18.2 mmHg. Aortic valve area, by VTI measures 0.72 cm. There is a 26 mm valve present in the aortic position. Procedure Date: 12/12/2019. Pulmonic Valve: The pulmonic valve was normal in structure. Pulmonic valve regurgitation is not visualized. No evidence of pulmonic stenosis. Aorta: The aortic root is normal in size and structure. There is mild (Grade II) plaque. Venous: The inferior vena cava is normal in size with greater than 50% respiratory variability, suggesting right atrial pressure of 3 mmHg. IAS/Shunts: No atrial level shunt detected by color flow Doppler.  LEFT VENTRICLE PLAX 2D LVIDd:         4.20 cm LVIDs:         3.00 cm LVOT diam:     2.10 cm LV SV:         37 LV SV Index:   19 LVOT Area:     3.46 cm  AORTIC VALVE AV Area (Vmax):    0.96 cm AV Area (Vmean):   0.61 cm AV Area (VTI):     0.72 cm AV Vmax:           213.50 cm/s AV Vmean:          150.650 cm/s AV VTI:            0.520 m AV Peak Grad:      18.2 mmHg AV Mean Grad:      26.0 mmHg LVOT Vmax:         59.20 cm/s LVOT Vmean:        26.500 cm/s LVOT VTI:          0.108 m LVOT/AV VTI ratio: 0.21 MR Peak grad:    143.0 mmHg MR Mean grad:    89.5 mmHg   SHUNTS MR Vmax:         598.00 cm/s Systemic VTI:  0.11 m MR Vmean:        438.5 cm/s  Systemic Diam: 2.10 cm MR PISA:         1.90 cm MR PISA Eff ROA: 12 mm MR PISA Radius:  0.55 cm Ena Dawley MD Electronically signed by Ena Dawley MD Signature Date/Time: 12/12/2019/2:08:34 PM    Final (Updated)    ECHOCARDIOGRAM LIMITED  Result Date: 11/28/2019    ECHOCARDIOGRAM REPORT   Patient Name:   Javien Tesch. Date of Exam: 11/28/2019 Medical Rec #:  [id number]          Height:       68.0 in Accession #:     [id number]         Weight:       175.0 lb Date of Birth:  August 14, 1929         BSA:          1.931 m Patient Age:    28 years           BP:           123/72 mmHg Patient Gender: M                  HR:           72 bpm. Exam Location:  Inpatient Procedure: Limited Echo, Limited Color Doppler and Cardiac Doppler Indications:    Syncope R55  History:  Patient has prior history of Echocardiogram examinations, most                 recent 01/03/2020. Aortic Valve Disease.  Sonographer:    Mikki Santee RDCS (AE) Referring Phys: [id number] [place]  1. Normal LV systolic function; grade 1 diastolic dysfunction; aortic valve not well visualized; moderate AS (mean gradient 27 mmHg) and mild AI.  2. Left ventricular ejection fraction, by estimation, is 60 to 65%. The left ventricle has normal function. The left ventricle has no regional wall motion abnormalities. Left ventricular diastolic parameters are consistent with Grade I diastolic dysfunction (impaired relaxation).  3. Right ventricular systolic function is normal. The right ventricular size is normal.  4. The mitral valve is normal in structure. No evidence of mitral valve regurgitation. No evidence of mitral stenosis.  5. The aortic valve was not well visualized. Aortic valve regurgitation is mild. Moderate aortic valve stenosis. FINDINGS  Left Ventricle: Left ventricular ejection fraction, by estimation, is 60 to 65%. The left ventricle has normal function. The left ventricle has no regional wall motion abnormalities. The left ventricular internal cavity size was normal in size. There is  no left ventricular hypertrophy. Left ventricular diastolic parameters are consistent with Grade I diastolic dysfunction (impaired relaxation). Right Ventricle: The right ventricular size is normal. Right ventricular systolic function is normal. Left Atrium: Left atrial size was normal in size. Right Atrium: Right atrial size was normal in size. Pericardium:  There is no evidence of pericardial effusion. Mitral Valve: The mitral valve is normal in structure. Normal mobility of the mitral valve leaflets. No evidence of mitral valve regurgitation. No evidence of mitral valve stenosis. Tricuspid Valve: The tricuspid valve is normal in structure. Tricuspid valve regurgitation is not demonstrated. No evidence of tricuspid stenosis. Aortic Valve: The aortic valve was not well visualized. Aortic valve regurgitation is mild. Aortic regurgitation PHT measures 387 msec. Moderate aortic stenosis is present. Aortic valve mean gradient measures 26.5 mmHg. Aortic valve peak gradient measures 45.8 mmHg. Aortic valve area, by VTI measures 1.09 cm. Pulmonic Valve: The pulmonic valve was not well visualized. Pulmonic valve regurgitation is not visualized. No evidence of pulmonic stenosis. Aorta: The aortic root is normal in size and structure. Venous: The inferior vena cava was not well visualized.  Additional Comments: Normal LV systolic function; grade 1 diastolic dysfunction; aortic valve not well visualized; moderate AS (mean gradient 27 mmHg) and mild AI.  LEFT VENTRICLE PLAX 2D LVIDd:         4.90 cm  Diastology LVIDs:         3.90 cm  LV e' lateral:   7.83 cm/s LV PW:         0.90 cm  LV E/e' lateral: 9.4 LV IVS:        1.00 cm  LV e' medial:    7.40 cm/s LVOT diam:     2.30 cm  LV E/e' medial:  9.9 LV SV:         95 LV SV Index:   49 LVOT Area:     4.15 cm  LEFT ATRIUM             Index LA diam:        3.50 cm 1.81 cm/m LA Vol (A2C):   57.3 ml 29.67 ml/m LA Vol (A4C):   59.0 ml 30.55 ml/m LA Biplane Vol: 60.7 ml 31.43 ml/m  AORTIC VALVE AV Area (Vmax):    1.04 cm AV Area (  Vmean):   1.03 cm AV Area (VTI):     1.09 cm AV Vmax:           338.25 cm/s AV Vmean:          242.750 cm/s AV VTI:            0.872 m AV Peak Grad:      45.8 mmHg AV Mean Grad:      26.5 mmHg LVOT Vmax:         85.00 cm/s LVOT Vmean:        59.900 cm/s LVOT VTI:          0.229 m LVOT/AV VTI ratio: 0.26  AI PHT:            387 msec  AORTA Ao Root diam: 3.10 cm MITRAL VALVE                TRICUSPID VALVE MV Area (PHT): 2.48 cm     TR Peak grad:   19.7 mmHg MV Decel Time: 306 msec     TR Vmax:        222.00 cm/s MV E velocity: 73.50 cm/s MV A velocity: 131.00 cm/s  SHUNTS MV E/A ratio:  0.56         Systemic VTI:  0.23 m                             Systemic Diam: 2.30 cm Kirk Ruths MD Electronically signed by Kirk Ruths MD Signature Date/Time: 11/28/2019/12:49:56 PM    Final    Structural Heart Procedure  Result Date: 12/12/2019 See surgical note for result.  CT Angio Abd/Pel w/ and/or w/o  Result Date: 11/30/2019 CLINICAL DATA:  86 year old male with history of severe aortic stenosis. Preprocedural study prior to potential transcatheter aortic valve replacement (TAVR). EXAM: CT ANGIOGRAPHY CHEST, ABDOMEN AND PELVIS TECHNIQUE: Non-contrast CT of the chest was initially obtained. Multidetector CT imaging through the chest, abdomen and pelvis was performed using the standard protocol during bolus administration of intravenous contrast. Multiplanar reconstructed images and MIPs were obtained and reviewed to evaluate the vascular anatomy. CONTRAST:  37mL OMNIPAQUE IOHEXOL 350 MG/ML SOLN COMPARISON:  No priors. FINDINGS: CTA CHEST FINDINGS Cardiovascular: Heart size is borderline enlarged. There is no significant pericardial fluid, thickening or pericardial calcification. Left anterior descending coronary artery stent. Aortic atherosclerosis. Severe thickening calcification of the aortic valve. Mild calcifications of the mitral annulus. Mediastinum/Lymph Nodes: No pathologically enlarged mediastinal or hilar lymph nodes. Esophagus is unremarkable in appearance. No axillary lymphadenopathy. Lungs/Pleura: No acute consolidative airspace disease. No pleural effusions. No suspicious appearing pulmonary nodules or masses are noted. Musculoskeletal/Soft Tissues: There are no aggressive appearing lytic or blastic  lesions noted in the visualized portions of the skeleton. CTA ABDOMEN AND PELVIS FINDINGS Hepatobiliary: No suspicious cystic or solid hepatic lesions. No intra or extrahepatic biliary ductal dilatation. Gallbladder is normal in appearance. Pancreas: No pancreatic mass. No pancreatic ductal dilatation. No pancreatic or peripancreatic fluid collections or inflammatory changes. Spleen: Unremarkable. Adrenals/Urinary Tract: Mild atrophy of the kidneys bilaterally. No suspicious renal lesions. No hydroureteronephrosis. Bilateral adrenal glands are normal in appearance. Urinary bladder is normal in appearance. Stomach/Bowel: The appearance of the stomach is normal. No pathologic dilatation of small bowel or colon. Numerous colonic diverticulae are noted, particularly in the sigmoid colon, without surrounding inflammatory changes to suggest an acute diverticulitis at this time. The appendix is not confidently identified and may be surgically absent.  Regardless, there are no inflammatory changes noted adjacent to the cecum to suggest the presence of an acute appendicitis at this time. Vascular/Lymphatic: Aortic atherosclerosis, with vascular findings and measurements pertinent to potential TAVR procedure, as detailed below. In addition, there is an ulcerated plaque with vascular web in the infrarenal abdominal aorta best appreciated on axial image 144 of series 15. No lymphadenopathy noted in the abdomen or pelvis. Reproductive: Numerous brachytherapy implants are noted throughout the prostate gland. Seminal vesicles are unremarkable in appearance. Other: No significant volume of ascites.  No pneumoperitoneum. Musculoskeletal: There are no aggressive appearing lytic or blastic lesions noted in the visualized portions of the skeleton. VASCULAR MEASUREMENTS PERTINENT TO TAVR: AORTA: Minimal Aortic Diameter-11 x 11 mm Severity of Aortic Calcification-severe RIGHT PELVIS: Right Common Iliac Artery - Minimal Diameter-7.9 x 6.2  mm Tortuosity-moderate Calcification-moderate Right External Iliac Artery - Minimal Diameter-8.4 x 8.7 mm Tortuosity-moderate to severe Calcification-none Right Common Femoral Artery - Minimal Diameter-9.3 x 9.3 mm Tortuosity-mild Calcification-none LEFT PELVIS: Left Common Iliac Artery - Minimal Diameter-8.3 x 7.7 mm Tortuosity-severe Calcification-moderate Left External Iliac Artery - Minimal Diameter-7.7 x 6.7 mm Tortuosity-moderate Calcification-minimal Left Common Femoral Artery - Minimal Diameter-7.5 x 7.4 mm Tortuosity-mild Calcification-none Review of the MIP images confirms the above findings. IMPRESSION: 1. Vascular findings and measurements pertinent to potential TAVR procedure, as detailed above. 2. Severe thickening calcification of the aortic valve, compatible with reported clinical history of severe aortic stenosis. 3. Mild atrophy of the kidneys bilaterally. 4. Mild colonic diverticulosis without evidence of acute diverticulitis at this time. 5. Additional incidental findings, as above. Electronically Signed   By: Vinnie Langton M.D.   On: 11/30/2019 08:24   Disposition   Pt is being discharged home today in good condition.  Follow-up Plans & Appointments    Follow-up Information    Eileen Stanford, PA-C. Go on 12/21/2019.   Specialties: Cardiology, Radiology Why: @ 1:30pm, please arrive at least 10 minutes early.  Contact information: [place] [phone]          Discharge Instructions    Amb Referral to Cardiac Rehabilitation   Complete by: As directed    Diagnosis: Valve Replacement   Valve: Aortic Comment - TAVR   After initial evaluation and assessments completed: Virtual Based Care may be provided alone or in conjunction with Phase 2 Cardiac Rehab based on patient barriers.: Yes      Discharge Medications   Allergies as of 12/13/2019   No Known Allergies     Medication List    TAKE these medications     acetaminophen 325 MG tablet Commonly known as: TYLENOL Take 325-650 mg by mouth every 6 (six) hours as needed for moderate pain or headache.   clopidogrel 75 MG tablet Commonly known as: PLAVIX TAKE 1 TABLET EVERY DAY WITH BREAKFAST. What changed: See the new instructions.   diltiazem 180 MG 24 hr capsule Commonly known as: CARDIZEM CD Take 1 capsule (180 mg total) by mouth daily.   Eliquis 2.5 MG Tabs tablet Generic drug: apixaban TAKE 1 TABLET BY MOUTH TWICE DAILY. What changed: how much to take   ferrous sulfate 325 (65 FE) MG tablet Take 325 mg by mouth daily with breakfast.   furosemide 40 MG tablet Commonly known as: LASIX Take 0.5 tablets (20 mg total) by mouth daily.   isosorbide mononitrate 60 MG 24 hr tablet Commonly known as: IMDUR Take 1 tablet (60 mg total) by mouth daily.  loperamide 2 MG tablet Commonly known as: IMODIUM A-D Take 2 mg by mouth 4 (four) times daily as needed for diarrhea or loose stools.   MULTIVITAMIN PO Take 1 tablet by mouth daily.   nitroGLYCERIN 0.4 MG SL tablet Commonly known as: NITROSTAT ONE TABLET UNDER TONGUE WHEN NEEDED FOR CHEST PAIN. MAY REPEAT IN 5 MINUTES. What changed: See the new instructions.   rosuvastatin 20 MG tablet Commonly known as: CRESTOR Take 1 tablet (20 mg total) by mouth at bedtime.   topiramate 200 MG tablet Commonly known as: TOPAMAX TAKE 1 TABLET ONCE DAILY.   vitamin B-12 1000 MCG tablet Commonly known as: CYANOCOBALAMIN Take 1,000 mcg by mouth daily.           Outstanding Labs/Studies  none  Duration of Discharge Encounter   Greater than 30 minutes including physician time.  SignedAngelena Form, PA-C 12/13/2019, 12:13 PM [phone]

## 2019-12-13 NOTE — Progress Notes (Signed)
  Echocardiogram 2D Echocardiogram has been performed.  Jasreet Dickie G Jeanette Rauth 12/13/2019, 1:12 PM

## 2019-12-13 NOTE — Progress Notes (Signed)
CARDIAC REHAB PHASE I   PRE:  Rate/Rhythm: 79 SR  BP:  Supine: 119/64  Sitting:   Standing:    SaO2: 98%RA  MODE:  Ambulation: 940 ft   POST:  Rate/Rhythm: 85 SR  BP:  Supine: 104/52  Sitting:    Standing:     SaO2: 97%RA 0808-0855 Pt walked 940 ft on RA with rolling walker with minimal asst. Gait steady. No c/o dizziness or SOB. Tolerated well. To bed after walk. Has walker at home if needed. Left heart healthy diet for reference if interested. Encouraged him to continue walking as tolerated. Has completed CRP 2 before. Referring back to [place] CRP 2.   Graylon Good, RN BSN  12/13/2019 8:50 AM

## 2019-12-13 NOTE — Progress Notes (Signed)
1 Day Post-Op Procedure(s) (LRB): TRANSCATHETER AORTIC VALVE REPLACEMENT, LEFT SUBCLAVIAN (Left) TRANSESOPHAGEAL ECHOCARDIOGRAM (TEE) (N/A) Subjective:  No complaints. Ambulated this am without chest pain or shortness of breath. Asking about going back to work.  Objective: Vital signs in last 24 hours: Temp:  [97.3 F (36.3 C)-98.2 F (36.8 C)] 97.4 F (36.3 C) (05/05 0300) Pulse Rate:  [0-295] 92 (05/04 2029) Cardiac Rhythm: Normal sinus rhythm (05/05 0300) Resp:  [10-20] 15 (05/05 0300) BP: (106-195)/(58-95) 122/66 (05/05 0300) SpO2:  [92 %-100 %] 99 % (05/05 0300) Weight:  [84.6 kg-84.9 kg] 84.6 kg (05/05 0645)  Hemodynamic parameters for last 24 hours:    Intake/Output from previous day: 05/04 0701 - 05/05 0700 In: 2099.2 [I.V.:1599.2; IV Piggyback:500] Out: 1200 [Urine:1150; Blood:50] Intake/Output this shift: No intake/output data recorded.  General appearance: alert and cooperative Neurologic: intact Heart: regular rate and rhythm, S1, S2 normal, no murmur Lungs: clear to auscultation bilaterally Abdomen: soft, non-tender; bowel sounds normal Extremities: extremities normal, atraumatic, no cyanosis or edema Wound: groin site ok, left subclavian access incision ok with mild ecchymosis.  Lab Results: Recent Labs    12/12/19 1417 12/13/19 0500  WBC  --  13.6*  HGB 9.2* 9.7*  HCT 27.0* 30.2*  PLT  --  190   BMET:  Recent Labs    12/12/19 1417 12/13/19 0500  NA 141 138  K 4.5 4.3  CL 113* 111  CO2  --  19*  GLUCOSE 103* 193*  BUN 37* 45*  CREATININE 2.50* 2.60*  CALCIUM  --  7.9*    PT/INR: No results for input(s): LABPROT, INR in the last 72 hours. ABG    Component Value Date/Time   PHART 7.363 12/08/2019 1004   HCO3 18.8 (L) 12/08/2019 1004   TCO2 22 12/12/2019 1417   ACIDBASEDEF 5.6 (H) 12/08/2019 1004   O2SAT 98.8 12/08/2019 1004   CBG (last 3)  No results for input(s): GLUCAP in the last 72 hours.  ECG: sinus, 1st degree AV block that  is new but no higher block or bradycardia  Assessment/Plan: S/P Procedure(s) (LRB): TRANSCATHETER AORTIC VALVE REPLACEMENT, LEFT SUBCLAVIAN (Left) TRANSESOPHAGEAL ECHOCARDIOGRAM (TEE) (N/A)  Hemodynamically stable  Rhythm looks ok.  Stage 4 CKD: creat stable at about his baseline and urine output good postop. Plan 2D echo today. Will resume Eliquis and Plavix at discharge. Plan home today with family.  LOS: 1 day    Gaye Pollack 12/13/2019

## 2019-12-14 ENCOUNTER — Telehealth: Payer: Self-pay

## 2019-12-14 NOTE — Telephone Encounter (Signed)
Patient contacted regarding discharge from [facility] on 12/13/2019.  Patient understands to follow up with provider Nell Range PA-C on 12/21/2019  at 1:30 PM at [facility] office. Patient understands discharge instructions? yes Patient understands medications and regiment? yes Patient understands to bring all medications to this visit? Yes  The pt is doing well today and has no questions or concerns at this time.

## 2019-12-15 ENCOUNTER — Telehealth (HOSPITAL_COMMUNITY): Payer: Self-pay

## 2019-12-15 NOTE — Telephone Encounter (Signed)
Called patient to see if he is interested in the Cardiac Rehab Program. Patient expressed interest. Explained scheduling process and went over insurance, patient verbalized understanding. Will contact patient for scheduling once f/u has been completed.  °

## 2019-12-15 NOTE — Telephone Encounter (Signed)
Pt insurance is active and benefits verified through Medicare A/B. Co-pay $0.00, DED $203.00/$203.00 met, out of pocket $0.00/$0.00 met, co-insurance 20%. No pre-authorization required. Passport, 12/15/19 @ 10:01AM, [phone]  2ndary insurance is active and benefits verified through [employer]. Co-pay $0.00, DED $0.00/$0.00 met, out of pocket $0.00/$0.00 met, co-insurance 0%. No pre-authorization required. Passport, 12/15/19 @ 10:09AM, CQP#[id number]  Will contact patient to see if he is interested in the Cardiac Rehab Program. If interested, patient will need to complete follow up appt. Once completed, patient will be contacted for scheduling upon review by the RN Navigator.

## 2019-12-18 MED FILL — Magnesium Sulfate Inj 50%: INTRAMUSCULAR | Qty: 10 | Status: AC

## 2019-12-18 MED FILL — Heparin Sodium (Porcine) Inj 1000 Unit/ML: INTRAMUSCULAR | Qty: 30 | Status: AC

## 2019-12-18 MED FILL — Potassium Chloride Inj 2 mEq/ML: INTRAVENOUS | Qty: 40 | Status: AC
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
## 2019-12-21 ENCOUNTER — Telehealth: Payer: Self-pay | Admitting: Cardiology

## 2019-12-21 ENCOUNTER — Other Ambulatory Visit: Payer: Self-pay

## 2019-12-21 ENCOUNTER — Telehealth: Payer: Self-pay | Admitting: Radiology

## 2019-12-21 ENCOUNTER — Ambulatory Visit (INDEPENDENT_AMBULATORY_CARE_PROVIDER_SITE_OTHER): Payer: Medicare Other | Admitting: Physician Assistant

## 2019-12-21 ENCOUNTER — Encounter: Payer: Self-pay | Admitting: Physician Assistant

## 2019-12-21 VITALS — BP 144/70 | HR 74 | Ht 72.0 in | Wt 186.5 lb

## 2019-12-21 DIAGNOSIS — Z9861 Coronary angioplasty status: Secondary | ICD-10-CM | POA: Diagnosis not present

## 2019-12-21 DIAGNOSIS — I779 Disorder of arteries and arterioles, unspecified: Secondary | ICD-10-CM

## 2019-12-21 DIAGNOSIS — R55 Syncope and collapse: Secondary | ICD-10-CM | POA: Diagnosis not present

## 2019-12-21 DIAGNOSIS — D638 Anemia in other chronic diseases classified elsewhere: Secondary | ICD-10-CM | POA: Diagnosis not present

## 2019-12-21 DIAGNOSIS — I440 Atrioventricular block, first degree: Secondary | ICD-10-CM | POA: Diagnosis not present

## 2019-12-21 DIAGNOSIS — N184 Chronic kidney disease, stage 4 (severe): Secondary | ICD-10-CM

## 2019-12-21 DIAGNOSIS — Z87898 Personal history of other specified conditions: Secondary | ICD-10-CM

## 2019-12-21 DIAGNOSIS — I5032 Chronic diastolic (congestive) heart failure: Secondary | ICD-10-CM

## 2019-12-21 DIAGNOSIS — I2510 Atherosclerotic heart disease of native coronary artery without angina pectoris: Secondary | ICD-10-CM

## 2019-12-21 DIAGNOSIS — Z952 Presence of prosthetic heart valve: Secondary | ICD-10-CM | POA: Diagnosis not present

## 2019-12-21 MED ORDER — AMOXICILLIN 500 MG PO TABS
ORAL_TABLET | ORAL | 11 refills | Status: DC
Start: 2019-12-21 — End: 2022-01-13

## 2019-12-21 NOTE — Telephone Encounter (Signed)
Patient's daughter would like for Mark Jackson to go over her father's appt with her. Please advise.

## 2019-12-21 NOTE — Patient Instructions (Signed)
Medication Instructions:  1) Your provider discussed the importance of taking an antibiotic prior to all dental visits to prevent damage to the heart valves from infection. You were given a prescription for AMOXIL 2,000 mg to take one hour prior to any dental appointment.  *If you need a refill on your cardiac medications before your next appointment, please call your pharmacy*   Follow-Up: Please keep your upcoming appointments!   ZIO XT- Long Term Monitor Instructions   Your physician has requested you wear your ZIO patch monitor 14 days.   This is a single patch monitor.  [employer] supplies one patch monitor per enrollment.  Additional stickers are not available.   Please do not apply patch if you will be having a Nuclear Stress Test, Echocardiogram, Cardiac CT, MRI, or Chest Xray during the time frame you would be wearing the monitor. The patch cannot be worn during these tests.  You cannot remove and re-apply the ZIO XT patch monitor.   Your ZIO patch monitor will be sent USPS Priority mail from [facility] directly to your home address. The monitor may also be mailed to a PO BOX if home delivery is not available.   It may take 3-5 days to receive your monitor after you have been enrolled.   Once you have received you monitor, please review enclosed instructions.  Your monitor has already been registered assigning a specific monitor serial # to you.   Applying the monitor   Shave hair from upper left chest.   Hold abrader disc by orange tab.  Rub abrader in 40 strokes over left upper chest as indicated in your monitor instructions.   Clean area with 4 enclosed alcohol pads .  Use all pads to assure are is cleaned thoroughly.  Let dry.   Apply patch as indicated in monitor instructions.  Patch will be place under collarbone on left side of chest with arrow pointing upward.   Rub patch adhesive wings for 2 minutes.Remove white label marked "1".  Remove white label marked "2".   Rub patch adhesive wings for 2 additional minutes.   While looking in a mirror, press and release button in center of patch.  A small green light will flash 3-4 times .  This will be your only indicator the monitor has been turned on.     Do not shower for the first 24 hours.  You may shower after the first 24 hours.   Press button if you feel a symptom. You will hear a small click.  Record Date, Time and Symptom in the Patient Log Book.   When you are ready to remove patch, follow instructions on last 2 pages of Patient Log Book.  Stick patch monitor onto last page of Patient Log Book.   Place Patient Log Book in [place] box.  Use locking tab on box and tape box closed securely.  The [employer] has [employer] on it.  Please place in mailbox as soon as possible.  Your physician should have your test results approximately 7 days after the monitor has been mailed back to [facility].   Call [place] at [phone] if you have questions regarding your ZIO XT patch monitor.  Call them immediately if you see an orange light blinking on your monitor.   If your monitor falls off in less than 4 days contact our Monitor department at [phone].  If your monitor becomes loose or falls off after 4 days call [employer]  at [phone] for suggestions on securing your monitor.

## 2019-12-21 NOTE — Telephone Encounter (Signed)
Enrolled patient for a 14 day Zio monitor to be mailed to patients home.  

## 2019-12-21 NOTE — Telephone Encounter (Signed)
Left message to call back  

## 2019-12-22 NOTE — Telephone Encounter (Signed)
Follow up  Pt's daughter returning call

## 2019-12-25 NOTE — Telephone Encounter (Signed)
I spoke with daughter and she will come in to next apt with father

## 2019-12-28 ENCOUNTER — Telehealth (HOSPITAL_COMMUNITY): Payer: Self-pay

## 2019-12-28 NOTE — Telephone Encounter (Signed)
Called patient to see if he was interested in participating in the Cardiac Rehab Program. Patient stated yes. Patient will come in for orientation on 01/25/2020@9 :00am and will attend the 11:15am exercise class.  Mailed homework package.

## 2019-12-29 ENCOUNTER — Other Ambulatory Visit (INDEPENDENT_AMBULATORY_CARE_PROVIDER_SITE_OTHER): Payer: Medicare Other

## 2019-12-29 DIAGNOSIS — I5032 Chronic diastolic (congestive) heart failure: Secondary | ICD-10-CM

## 2019-12-29 DIAGNOSIS — I440 Atrioventricular block, first degree: Secondary | ICD-10-CM | POA: Diagnosis not present

## 2019-12-29 DIAGNOSIS — Z952 Presence of prosthetic heart valve: Secondary | ICD-10-CM | POA: Diagnosis not present

## 2019-12-29 DIAGNOSIS — R55 Syncope and collapse: Secondary | ICD-10-CM | POA: Diagnosis not present
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
## 2020-01-10 ENCOUNTER — Other Ambulatory Visit: Payer: Self-pay | Admitting: Family Medicine

## 2020-01-10 ENCOUNTER — Ambulatory Visit (INDEPENDENT_AMBULATORY_CARE_PROVIDER_SITE_OTHER): Payer: Medicare Other | Admitting: Physician Assistant

## 2020-01-10 ENCOUNTER — Other Ambulatory Visit: Payer: Self-pay

## 2020-01-10 ENCOUNTER — Ambulatory Visit (HOSPITAL_COMMUNITY): Payer: Medicare Other | Attending: Cardiology

## 2020-01-10 ENCOUNTER — Encounter: Payer: Self-pay | Admitting: Physician Assistant

## 2020-01-10 VITALS — BP 122/70 | HR 65 | Ht 68.0 in | Wt 182.0 lb

## 2020-01-10 DIAGNOSIS — N184 Chronic kidney disease, stage 4 (severe): Secondary | ICD-10-CM | POA: Diagnosis not present

## 2020-01-10 DIAGNOSIS — I6523 Occlusion and stenosis of bilateral carotid arteries: Secondary | ICD-10-CM

## 2020-01-10 DIAGNOSIS — Z9861 Coronary angioplasty status: Secondary | ICD-10-CM

## 2020-01-10 DIAGNOSIS — I2510 Atherosclerotic heart disease of native coronary artery without angina pectoris: Secondary | ICD-10-CM

## 2020-01-10 DIAGNOSIS — R5383 Other fatigue: Secondary | ICD-10-CM | POA: Diagnosis not present

## 2020-01-10 DIAGNOSIS — I779 Disorder of arteries and arterioles, unspecified: Secondary | ICD-10-CM | POA: Diagnosis not present

## 2020-01-10 DIAGNOSIS — Z952 Presence of prosthetic heart valve: Secondary | ICD-10-CM | POA: Diagnosis not present

## 2020-01-10 DIAGNOSIS — D638 Anemia in other chronic diseases classified elsewhere: Secondary | ICD-10-CM | POA: Diagnosis not present

## 2020-01-10 DIAGNOSIS — I5032 Chronic diastolic (congestive) heart failure: Secondary | ICD-10-CM | POA: Diagnosis not present

## 2020-01-10 DIAGNOSIS — I440 Atrioventricular block, first degree: Secondary | ICD-10-CM | POA: Diagnosis not present

## 2020-01-10 DIAGNOSIS — G43009 Migraine without aura, not intractable, without status migrainosus: Secondary | ICD-10-CM

## 2020-01-10 HISTORY — PX: TRANSTHORACIC ECHOCARDIOGRAM: SHX275

## 2020-01-10 NOTE — Progress Notes (Signed)
Phone [phone] In person visit   Subjective:   Mark Jackson. is a 86 y.o. year old very pleasant male patient who presents for/with See problem oriented charting Chief Complaint  Patient presents with  . Follow-up   This visit occurred during the SARS-CoV-2 public health emergency.  Safety protocols were in place, including screening questions prior to the visit, additional usage of staff PPE, and extensive cleaning of exam room while observing appropriate contact time as indicated for disinfecting solutions.   Past Medical History-  Patient Active Problem List   Diagnosis Date Noted  . CAD S/P DES PCI to proximal LAD 03/22/2015    Priority: High  . Paroxysmal atrial fibrillation ([place]); CHA2DSVasc - 4; Now on Eliquis 08/20/2014    Priority: High    Class: Diagnosis of  . Chronic kidney disease (CKD), active medical management without dialysis, stage 4 (severe) ([place]) 08/20/2014    Priority: High  . Personal history of prostate cancer 10/22/2008    Priority: High  . Hyperglycemia 09/27/2017    Priority: Medium  . B12 deficiency 01/06/2017    Priority: Medium  . BPH associated with nocturia 06/15/2016    Priority: Medium  . Hereditary and idiopathic peripheral neuropathy 01/12/2014    Priority: Medium  . H/O syncope 09/03/2013    Priority: Medium  . Right-sided carotid artery disease; followed by Dr. Ellyn Hack 03/02/2013    Priority: Medium  . Hyperlipidemia with target LDL less than 70 03/02/2013    Priority: Medium  . Migraine without aura 10/26/2012    Priority: Medium  . Anemia 10/23/2008    Priority: Medium  . Essential hypertension 10/22/2008    Priority: Medium  . Sleep apnea 10/22/2008    Priority: Medium  . Chronic diarrhea 01/06/2017    Priority: Low  . Perianal dermatitis 06/19/2015    Priority: Low  . Rectal bleeding 04/25/2015    Priority: Low  . Long term current use of anticoagulant therapy 08/27/2014    Priority: Low  . Obesity (BMI 30-39.9)  09/03/2013    Priority: Low  . GLAUCOMA 10/23/2008    Priority: Low  . Arthropathy 10/22/2008    Priority: Low  . History of colonic polyps 10/22/2008    Priority: Low  . Severe aortic stenosis 12/12/2019  . Acute on chronic diastolic heart failure ([place]) 12/12/2019  . S/P TAVR (transcatheter aortic valve replacement) 12/12/2019  . Syncope and collapse 11/27/2019  . Bilateral lower extremity edema 04/15/2017    Medications- reviewed and updated Current Outpatient Medications  Medication Sig Dispense Refill  . acetaminophen (TYLENOL) 325 MG tablet Take 325-650 mg by mouth every 6 (six) hours as needed for moderate pain or headache.     Marland Kitchen amoxicillin (AMOXIL) 500 MG tablet Take 2,000 mg (4 capsules) 1 hour prior to all dental visits. 8 tablet 11  . clopidogrel (PLAVIX) 75 MG tablet TAKE 1 TABLET EVERY DAY WITH BREAKFAST. 90 tablet 1  . diltiazem (CARDIZEM CD) 180 MG 24 hr capsule Take 1 capsule (180 mg total) by mouth daily. 90 capsule 3  . ELIQUIS 2.5 MG TABS tablet TAKE 1 TABLET BY MOUTH TWICE DAILY. 180 tablet 1  . ferrous sulfate 325 (65 FE) MG tablet Take 325 mg by mouth daily with breakfast.    . furosemide (LASIX) 40 MG tablet Take 0.5 tablets (20 mg total) by mouth daily.    . isosorbide mononitrate (IMDUR) 60 MG 24 hr tablet Take 1 tablet (60 mg total) by mouth daily.    Marland Kitchen  loperamide (IMODIUM A-D) 2 MG tablet Take 2 mg by mouth 4 (four) times daily as needed for diarrhea or loose stools.     . Multiple Vitamin (MULTIVITAMIN PO) Take 1 tablet by mouth daily.     . nitroGLYCERIN (NITROSTAT) 0.4 MG SL tablet ONE TABLET UNDER TONGUE WHEN NEEDED FOR CHEST PAIN. MAY REPEAT IN 5 MINUTES. 25 tablet 0  . rosuvastatin (CRESTOR) 20 MG tablet Take 1 tablet (20 mg total) by mouth at bedtime. 90 tablet 1  . topiramate (TOPAMAX) 200 MG tablet TAKE 1 TABLET ONCE DAILY. 90 tablet 0  . vitamin B-12 (CYANOCOBALAMIN) 1000 MCG tablet Take 1,000 mcg by mouth daily.    . vitamin B-12 (CYANOCOBALAMIN)  1000 MCG tablet Take 1,000 mcg by mouth daily.     No current facility-administered medications for this visit.     Objective:  BP 138/78   Pulse 94   Temp 98.2 F (36.8 C) (Temporal)   Ht 5\' 8"  (1.727 m)   Wt 185 lb (83.9 kg)   SpO2 96%   BMI 28.13 kg/m  Gen: NAD, resting comfortably CV: RRR soft murmur Lungs: CTAB no crackles, wheeze, rhonchi Abdomen: soft/nontender/nondistended/normal bowel sounds. No rebound or guarding.  Ext: 1+ edema Skin: warm, dry     Assessment and Plan   # fatigue #B12 deficiency S: 5 weeks today had aortic valve replacement.  He has had significant fatigue since that time and just does not feel like it is improving as rapidly as he would like-he was initially told would recover within 30 days. Patient feels like he is sleeping too much, always tired and short of breath. He does not feel like he has ever recovered from having covid in the first place at the end of last year. He is sleeping 10-10.5 hours a night and taking two naps during the day.   Today is actually the best he has felt recently.   Cardiac rehab starts in 2 weeks.  He does report having use nitroglycerin twice in the last month for chest pain but that resolved his symptoms-did not have an effect on fatigue A/P: Fatigue of unclear etiology but likely multifactorial including being post Covid as well as recent aortic valve replacement and slow recovery in addition to being age 70 and having to go through all this within a 43-month period. -We did recently change B12 from injection to oral supplement and he is not sure that was the right move-he would like to retrial at least 2 months of B12 injections.  I do not feel strongly about repeating B12 before checking this with last level being adequate.  We will make this transition but if it is not effective he can go back to oral agent and we can recheck B12 after some period of time -Patient denies depressed mood or anhedonia.  Stress could  contribute as caring for his ailing wife -I do not suspect coronary artery disease as cause of his symptoms of fatigue-patient saw cardiology yesterday and has follow-up with Dr. Ellyn Hack next month.  Does have echocardiogram ordered-we will await results.  Does have some edema but does not appear significantly worsened-doubt heart failure as cause of symptoms.  He will continue Plavix and Imdur as well as sparing nitroglycerin -Does have history of iron deficiency but anemia was improving on labs yesterday and patient remains on iron. - LVM and mychart message after visit to see if hed be interested in sleep apnea testing- had previously declined workup  # Atrial  fibrillation S: Rate controlled with diltiazem 180 mg extended release Anticoagulated with Eliquis 2.5 mg twice daily Patient is  followed by cardiology: Sees Dr. Ellyn Hack next month A/P: I do not think atrial fibrillation is the cause of his fatigue.  Appears to be rate controlled and appropriately anticoagulated-with anemia improving I feel like it is reasonable to continue Eliquis as well as Plavix   #Olecranon bursitis right elbow-discussed possible drainage versus compression sleeve-does not seem super interested in either at present but will let me know if he changes his mind.  Can simply buy compression sleeve over-the-counter  Recommended follow up: We did not schedule plan to follow-up but 3 to 6 months would be reasonable-generally he is very good about following up if he has any concerns Future Appointments  Date Time Provider [place]  01/25/2020  9:00 AM [facility] PHASE II ORIENT [facility] None  01/29/2020 11:15 AM [facility] PHASE II EXC [facility] None  02/22/2020  3:20 PM [name], MD [facility]    Lab/Order associations:   ICD-10-CM   1. Fatigue, unspecified type  R53.83   2. B12 deficiency  E53.8 cyanocobalamin ((VITAMIN B-12)) injection 1,000 mcg  3. Paroxysmal atrial fibrillation ([place]);  CHA2DSVasc - 4; Now on Eliquis  I48.0   4. CAD S/P DES PCI to proximal LAD  I25.119     Meds ordered this encounter  Medications  . cyanocobalamin ((VITAMIN B-12)) injection 1,000 mcg      Return precautions advised.  [name], MD

## 2020-01-10 NOTE — Patient Instructions (Addendum)
Health Maintenance Due  Topic Date Due  . COVID-19 Vaccine (1) has not had did have covid . Consider this once strength improves Never done   b12 injection today and schedule for 1 month- we will see if that helps compared to oral b12. If no improvement in symptoms can return to oral b12  bloodwork was reassuring with cardiology- I do not feel like we need to repeat today  Sorry to hear about the fatigue im hoping maybe going back to b12 injections or starting rehab may help. Or simply getting further out from your surgery

## 2020-01-10 NOTE — Patient Instructions (Addendum)
Medication Instructions:  Your provider recommends that you continue on your current medications as directed. Please refer to the Current Medication list given to you today.   *If you need a refill on your cardiac medications before your next appointment, please call your pharmacy*  Lab Work: TODAY! CBC, BMET, TSH If you have labs (blood work) drawn today and your tests are completely normal, you will receive your results only by: Marland Kitchen MyChart Message (if you have MyChart) OR . A paper copy in the mail If you have any lab test that is abnormal or we need to change your treatment, we will call you to review the results.  Follow-Up: You have an appointment with Dr. Ellyn Hack on 02/22/2020 at 3:20PM.   We will call you to arrange your 1 year TAVR visits!

## 2020-01-11 ENCOUNTER — Telehealth: Payer: Self-pay

## 2020-01-11 LAB — CBC WITH DIFFERENTIAL/PLATELET
Basophils Absolute: 0 10*3/uL (ref 0.0–0.2)
Basos: 1 %
EOS (ABSOLUTE): 0.5 10*3/uL — ABNORMAL HIGH (ref 0.0–0.4)
Eos: 6 %
Hematocrit: 31.2 % — ABNORMAL LOW (ref 37.5–51.0)
Hemoglobin: 10.5 g/dL — ABNORMAL LOW (ref 13.0–17.7)
Immature Grans (Abs): 0 10*3/uL (ref 0.0–0.1)
Immature Granulocytes: 1 %
Lymphocytes Absolute: 1.6 10*3/uL (ref 0.7–3.1)
Lymphs: 18 %
MCH: 33.3 pg — ABNORMAL HIGH (ref 26.6–33.0)
MCHC: 33.7 g/dL (ref 31.5–35.7)
MCV: 99 fL — ABNORMAL HIGH (ref 79–97)
Monocytes Absolute: 1.1 10*3/uL — ABNORMAL HIGH (ref 0.1–0.9)
Monocytes: 13 %
Neutrophils Absolute: 5.3 10*3/uL (ref 1.4–7.0)
Neutrophils: 61 %
Platelets: 141 10*3/uL — ABNORMAL LOW (ref 150–450)
RBC: 3.15 x10E6/uL — ABNORMAL LOW (ref 4.14–5.80)
RDW: 12.6 % (ref 11.6–15.4)
WBC: 8.5 10*3/uL (ref 3.4–10.8)

## 2020-01-11 LAB — BASIC METABOLIC PANEL
BUN/Creatinine Ratio: 18 (ref 10–24)
BUN: 46 mg/dL — ABNORMAL HIGH (ref 8–27)
CO2: 21 mmol/L (ref 20–29)
Calcium: 8.5 mg/dL — ABNORMAL LOW (ref 8.6–10.2)
Chloride: 109 mmol/L — ABNORMAL HIGH (ref 96–106)
Creatinine, Ser: 2.51 mg/dL — ABNORMAL HIGH (ref 0.76–1.27)
GFR calc Af Amer: 26 mL/min/{1.73_m2} — ABNORMAL LOW (ref >59)
GFR calc non Af Amer: 22 mL/min/{1.73_m2} — ABNORMAL LOW (ref >59)
Glucose: 100 mg/dL — ABNORMAL HIGH (ref 65–99)
Potassium: 4.7 mmol/L (ref 3.5–5.2)
Sodium: 142 mmol/L (ref 134–144)

## 2020-01-11 LAB — TSH: TSH: 3.84 u[IU]/mL (ref 0.450–4.500)

## 2020-01-11 NOTE — Telephone Encounter (Signed)
The patient has been notified of the lab result and verbalized understanding.  All questions (if any) were answered. Frederik Schmidt, RN 01/11/2020 10:37 AM

## 2020-01-11 NOTE — Telephone Encounter (Signed)
-----   Message from Eileen Stanford, PA-C sent at 01/11/2020  8:58 AM EDT ----- Labs show stable kidney function, blood counts, and TSH. Nothing to explain ongoing fatigue.

## 2020-01-16 ENCOUNTER — Encounter: Payer: Self-pay | Admitting: Family Medicine

## 2020-01-16 ENCOUNTER — Other Ambulatory Visit: Payer: Self-pay

## 2020-01-16 ENCOUNTER — Ambulatory Visit (INDEPENDENT_AMBULATORY_CARE_PROVIDER_SITE_OTHER): Payer: Medicare Other | Admitting: Family Medicine

## 2020-01-16 VITALS — BP 138/78 | HR 94 | Temp 98.2°F | Ht 68.0 in | Wt 185.0 lb

## 2020-01-16 DIAGNOSIS — E538 Deficiency of other specified B group vitamins: Secondary | ICD-10-CM | POA: Diagnosis not present

## 2020-01-16 DIAGNOSIS — I25119 Atherosclerotic heart disease of native coronary artery with unspecified angina pectoris: Secondary | ICD-10-CM

## 2020-01-16 DIAGNOSIS — I480 Paroxysmal atrial fibrillation: Secondary | ICD-10-CM | POA: Diagnosis not present

## 2020-01-16 DIAGNOSIS — I6523 Occlusion and stenosis of bilateral carotid arteries: Secondary | ICD-10-CM | POA: Diagnosis not present

## 2020-01-16 DIAGNOSIS — R5383 Other fatigue: Secondary | ICD-10-CM | POA: Diagnosis not present

## 2020-01-16 MED ORDER — CYANOCOBALAMIN 1000 MCG/ML IJ SOLN
1000.0000 ug | Freq: Once | INTRAMUSCULAR | Status: AC
  Administered 2020-01-16: 1000 ug via INTRAMUSCULAR

## 2020-01-23 ENCOUNTER — Encounter (HOSPITAL_COMMUNITY)
Admission: RE | Admit: 2020-01-23 | Discharge: 2020-01-23 | Disposition: A | Discharge status: Home / Self Care | Payer: Medicare Other | Source: Ambulatory Visit | Attending: Cardiology | Admitting: Cardiology

## 2020-01-23 ENCOUNTER — Other Ambulatory Visit: Payer: Self-pay

## 2020-01-23 ENCOUNTER — Telehealth (HOSPITAL_COMMUNITY): Payer: Self-pay

## 2020-01-23 DIAGNOSIS — Z952 Presence of prosthetic heart valve: Secondary | ICD-10-CM | POA: Insufficient documentation

## 2020-01-23 NOTE — Telephone Encounter (Signed)
Cardiac Rehab Note:  Unsuccessful telephone encounter to [name] to confirm cardiac rehab orientation appointment for 01/25/20 at 9:00am. [employer] compliant VM message left requesting call back to [phone].  Ayliana Casciano E. Rollene Rotunda RN, BSN [place]. [facility]  Cardiac and Pulmonary Rehabilitation Phone: [phone] Fax: [phone]

## 2020-01-23 NOTE — Progress Notes (Signed)
Cardiac Rehab Telephone Note:  Successful telephone encounter to Mark Jackson. to confirm Cardiac Rehab orientation appointment for 01/25/20 at 9:00 am. Nursing assessment completed. Patient questions answered. Instructions for appointment provided. Patient screening for Covid-19 negative.  [name] E. Rollene Rotunda RN, BSN [place]. [facility]  Cardiac and Pulmonary Rehabilitation Phone: [phone] Fax: [phone]

## 2020-01-24 ENCOUNTER — Telehealth (HOSPITAL_COMMUNITY): Payer: Self-pay | Admitting: Student-PharmD

## 2020-01-24 NOTE — Telephone Encounter (Signed)
Cardiac Rehab Medication Review by a Pharmacist  Does the patient  feel that his/her medications are working for him/her?  Yes   Has the patient been experiencing any side effects to the medications prescribed?  No   Does the patient measure his/her own blood pressure or blood glucose at home?  No. Patient has a blood pressure machine at home, but the last time measured > 6 months ago.  Does the patient have any problems obtaining medications due to transportation or finances?   No   Understanding of regimen: good Understanding of indications: good Potential of compliance: good    Pharmacist comments: Patient has good understanding of medications and was able to answer all questions well.    Sophiamarie Nease L Dacian Orrico 01/24/2020 12:20 PM

## 2020-01-25 ENCOUNTER — Encounter (HOSPITAL_COMMUNITY)
Admission: RE | Admit: 2020-01-25 | Discharge: 2020-01-25 | Disposition: A | Discharge status: Home / Self Care | Payer: Medicare Other | Source: Ambulatory Visit | Attending: Cardiology | Admitting: Cardiology

## 2020-01-25 ENCOUNTER — Other Ambulatory Visit: Payer: Self-pay

## 2020-01-25 ENCOUNTER — Encounter (HOSPITAL_COMMUNITY): Payer: Self-pay

## 2020-01-25 VITALS — BP 128/72 | HR 81 | Ht 68.0 in | Wt 181.0 lb

## 2020-01-25 DIAGNOSIS — Z952 Presence of prosthetic heart valve: Secondary | ICD-10-CM
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
## 2020-01-29 ENCOUNTER — Other Ambulatory Visit: Payer: Self-pay

## 2020-01-29 ENCOUNTER — Encounter (HOSPITAL_COMMUNITY)
Admission: RE | Admit: 2020-01-29 | Discharge: 2020-01-29 | Disposition: A | Discharge status: Home / Self Care | Payer: Medicare Other | Source: Ambulatory Visit | Attending: Cardiology | Admitting: Cardiology

## 2020-01-29 DIAGNOSIS — Z952 Presence of prosthetic heart valve: Secondary | ICD-10-CM

## 2020-01-29 NOTE — Progress Notes (Signed)
Daily Session Note  Patient Details  Name: Stevan R Mia Jr. MRN: [id number] Date of Birth: 09/06/1929 Referring Provider:     CARDIAC REHAB PHASE II ORIENTATION from 01/25/2020 in [facility]  Referring Provider Harding, David      Encounter Date: 01/29/2020  Check In:  Session Check In - 01/29/20 1117      Check-In   Supervising physician immediately available to respond to emergencies [facility] immediately available    Physician(s) Dr Mathews    Location [facility]    Staff Present David Makemson, MS, EP-C, CCRP;Jessica Martin, MS, ACSM-CEP, Exercise Physiologist;Portia Payne, RN, BSN;Olinty Richards, MS, ACSM CEP, Exercise Physiologist;Maria Whitaker, RN, BSN    Virtual Visit No    Medication changes reported     No    Fall or balance concerns reported    No    Tobacco Cessation No Change    Warm-up and Cool-down Performed on first and last piece of equipment    Resistance Training Performed Yes    VAD Patient? No    PAD/SET Patient? No      Pain Assessment   Currently in Pain? No/denies    Multiple Pain Sites No           Capillary Blood Glucose: No results found for this or any previous visit (from the past 24 hour(s)).   Exercise Prescription Changes - 01/29/20 1124      Response to Exercise   Blood Pressure (Admit) 152/80    Blood Pressure (Exercise) 148/72    Blood Pressure (Exit) 122/70    Heart Rate (Admit) 93 bpm    Heart Rate (Exercise) 98 bpm    Heart Rate (Exit) 86 bpm    Rating of Perceived Exertion (Exercise) 11    Symptoms none    Comments Off to a good start with execise.    Duration Progress to 30 minutes of  aerobic without signs/symptoms of physical distress    Intensity THRR unchanged      Progression   Progression Continue to progress workloads to maintain intensity without signs/symptoms of physical distress.    Average METs 2      Resistance Training   Training Prescription  Yes    Weight 2 lbs    Reps 10-15    Time 10 Minutes      Interval Training   Interval Training No      NuStep   Level 1    SPM 85    Minutes 25    METs 1.7           Social History   Tobacco Use  Smoking Status Former Smoker  . Years: 15.00  . Types: Pipe, Cigars  . Quit date: 08/10/1968  . Years since quitting: 51.5  Smokeless Tobacco Never Used    Goals Met:  Exercise tolerated well  Goals Unmet:  Not Applicable  Comments: Pt started cardiac rehab today.  Pt tolerated light exercise without difficulty. VSS, telemetry-Sinus Rhythm, asymptomatic.  Medication list reconciled. Pt denies barriers to medicaiton compliance.  PSYCHOSOCIAL ASSESSMENT:  PHQ-0. Pt exhibits positive coping skills, hopeful outlook with supportive family. No psychosocial needs identified at this time, no psychosocial interventions necessary.    Pt enjoys reading.   Pt oriented to exercise equipment and routine.    Understanding verbalized.   Dr. Traci Turner is Medical Director for Cardiac Rehab at [facility]. 
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
## 2020-01-31 ENCOUNTER — Encounter (HOSPITAL_COMMUNITY): Payer: Medicare Other

## 2020-01-31 ENCOUNTER — Telehealth (HOSPITAL_COMMUNITY): Payer: Self-pay | Admitting: Family Medicine

## 2020-02-02 ENCOUNTER — Other Ambulatory Visit: Payer: Self-pay

## 2020-02-02 ENCOUNTER — Encounter (HOSPITAL_COMMUNITY)
Admission: RE | Admit: 2020-02-02 | Discharge: 2020-02-02 | Disposition: A | Discharge status: Home / Self Care | Payer: Medicare Other | Source: Ambulatory Visit | Attending: Cardiology | Admitting: Cardiology

## 2020-02-02 DIAGNOSIS — Z952 Presence of prosthetic heart valve: Secondary | ICD-10-CM

## 2020-02-05 ENCOUNTER — Encounter (HOSPITAL_COMMUNITY)
Admission: RE | Admit: 2020-02-05 | Discharge: 2020-02-05 | Disposition: A | Discharge status: Home / Self Care | Payer: Medicare Other | Source: Ambulatory Visit | Attending: Cardiology | Admitting: Cardiology

## 2020-02-05 ENCOUNTER — Other Ambulatory Visit: Payer: Self-pay

## 2020-02-05 DIAGNOSIS — Z952 Presence of prosthetic heart valve: Secondary | ICD-10-CM | POA: Diagnosis not present

## 2020-02-07 ENCOUNTER — Other Ambulatory Visit: Payer: Self-pay

## 2020-02-07 ENCOUNTER — Encounter (HOSPITAL_COMMUNITY)
Admission: RE | Admit: 2020-02-07 | Discharge: 2020-02-07 | Disposition: A | Discharge status: Home / Self Care | Payer: Medicare Other | Source: Ambulatory Visit | Attending: Cardiology | Admitting: Cardiology

## 2020-02-07 DIAGNOSIS — Z952 Presence of prosthetic heart valve: Secondary | ICD-10-CM | POA: Diagnosis not present

## 2020-02-09 ENCOUNTER — Other Ambulatory Visit: Payer: Self-pay

## 2020-02-09 ENCOUNTER — Encounter (HOSPITAL_COMMUNITY)
Admission: RE | Admit: 2020-02-09 | Discharge: 2020-02-09 | Disposition: A | Discharge status: Home / Self Care | Payer: Medicare Other | Source: Ambulatory Visit | Attending: Cardiology | Admitting: Cardiology

## 2020-02-09 DIAGNOSIS — N183 Chronic kidney disease, stage 3 unspecified: Secondary | ICD-10-CM | POA: Diagnosis not present

## 2020-02-09 DIAGNOSIS — E785 Hyperlipidemia, unspecified: Secondary | ICD-10-CM | POA: Diagnosis not present

## 2020-02-09 DIAGNOSIS — I480 Paroxysmal atrial fibrillation: Secondary | ICD-10-CM | POA: Diagnosis not present

## 2020-02-09 DIAGNOSIS — E669 Obesity, unspecified: Secondary | ICD-10-CM | POA: Insufficient documentation

## 2020-02-09 DIAGNOSIS — Z87891 Personal history of nicotine dependence: Secondary | ICD-10-CM | POA: Diagnosis not present

## 2020-02-09 DIAGNOSIS — Z6830 Body mass index (BMI) 30.0-30.9, adult: Secondary | ICD-10-CM | POA: Diagnosis not present

## 2020-02-09 DIAGNOSIS — Z952 Presence of prosthetic heart valve: Secondary | ICD-10-CM | POA: Insufficient documentation

## 2020-02-09 DIAGNOSIS — Z79899 Other long term (current) drug therapy: Secondary | ICD-10-CM | POA: Diagnosis not present

## 2020-02-09 DIAGNOSIS — Z7902 Long term (current) use of antithrombotics/antiplatelets: Secondary | ICD-10-CM | POA: Diagnosis not present

## 2020-02-09 DIAGNOSIS — I129 Hypertensive chronic kidney disease with stage 1 through stage 4 chronic kidney disease, or unspecified chronic kidney disease: Secondary | ICD-10-CM | POA: Insufficient documentation

## 2020-02-09 DIAGNOSIS — I2510 Atherosclerotic heart disease of native coronary artery without angina pectoris: Secondary | ICD-10-CM | POA: Insufficient documentation

## 2020-02-09 DIAGNOSIS — Z7901 Long term (current) use of anticoagulants: Secondary | ICD-10-CM | POA: Diagnosis not present

## 2020-02-13 ENCOUNTER — Other Ambulatory Visit: Payer: Self-pay

## 2020-02-13 ENCOUNTER — Ambulatory Visit (INDEPENDENT_AMBULATORY_CARE_PROVIDER_SITE_OTHER): Payer: Medicare Other

## 2020-02-13 DIAGNOSIS — E538 Deficiency of other specified B group vitamins: Secondary | ICD-10-CM

## 2020-02-13 MED ORDER — CYANOCOBALAMIN 1000 MCG/ML IJ SOLN
1000.0000 ug | Freq: Once | INTRAMUSCULAR | Status: AC
  Administered 2020-02-13: 1000 ug via INTRAMUSCULAR

## 2020-02-13 NOTE — Progress Notes (Signed)
Per orders of Dr. [name], injection of B-12 given by Francella Solian in left deltoid. Patient tolerated injection well. Patient will make appointment for 1 month.

## 2020-02-14 ENCOUNTER — Encounter (HOSPITAL_COMMUNITY)
Admission: RE | Admit: 2020-02-14 | Discharge: 2020-02-14 | Disposition: A | Discharge status: Home / Self Care | Payer: Medicare Other | Source: Ambulatory Visit | Attending: Cardiology | Admitting: Cardiology

## 2020-02-14 DIAGNOSIS — E785 Hyperlipidemia, unspecified: Secondary | ICD-10-CM | POA: Diagnosis not present

## 2020-02-14 DIAGNOSIS — I2510 Atherosclerotic heart disease of native coronary artery without angina pectoris: Secondary | ICD-10-CM | POA: Diagnosis not present

## 2020-02-14 DIAGNOSIS — I129 Hypertensive chronic kidney disease with stage 1 through stage 4 chronic kidney disease, or unspecified chronic kidney disease: Secondary | ICD-10-CM | POA: Diagnosis not present

## 2020-02-14 DIAGNOSIS — Z952 Presence of prosthetic heart valve: Secondary | ICD-10-CM | POA: Diagnosis not present

## 2020-02-14 DIAGNOSIS — I480 Paroxysmal atrial fibrillation: Secondary | ICD-10-CM | POA: Diagnosis not present

## 2020-02-14 DIAGNOSIS — N183 Chronic kidney disease, stage 3 unspecified: Secondary | ICD-10-CM | POA: Diagnosis not present
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
## 2020-02-16 ENCOUNTER — Other Ambulatory Visit: Payer: Self-pay

## 2020-02-16 ENCOUNTER — Encounter (HOSPITAL_COMMUNITY)
Admission: RE | Admit: 2020-02-16 | Discharge: 2020-02-16 | Disposition: A | Discharge status: Home / Self Care | Payer: Medicare Other | Source: Ambulatory Visit | Attending: Cardiology | Admitting: Cardiology

## 2020-02-16 DIAGNOSIS — I129 Hypertensive chronic kidney disease with stage 1 through stage 4 chronic kidney disease, or unspecified chronic kidney disease: Secondary | ICD-10-CM | POA: Diagnosis not present

## 2020-02-16 DIAGNOSIS — Z952 Presence of prosthetic heart valve: Secondary | ICD-10-CM

## 2020-02-16 DIAGNOSIS — I2510 Atherosclerotic heart disease of native coronary artery without angina pectoris: Secondary | ICD-10-CM | POA: Diagnosis not present

## 2020-02-16 DIAGNOSIS — I480 Paroxysmal atrial fibrillation: Secondary | ICD-10-CM | POA: Diagnosis not present

## 2020-02-16 DIAGNOSIS — E785 Hyperlipidemia, unspecified: Secondary | ICD-10-CM | POA: Diagnosis not present

## 2020-02-16 DIAGNOSIS — N183 Chronic kidney disease, stage 3 unspecified: Secondary | ICD-10-CM | POA: Diagnosis not present

## 2020-02-19 ENCOUNTER — Other Ambulatory Visit (HOSPITAL_COMMUNITY): Payer: Self-pay | Admitting: Family Medicine

## 2020-02-19 ENCOUNTER — Encounter (HOSPITAL_COMMUNITY)
Admission: RE | Admit: 2020-02-19 | Discharge: 2020-02-19 | Disposition: A | Discharge status: Home / Self Care | Payer: Medicare Other | Source: Ambulatory Visit | Attending: Cardiology | Admitting: Cardiology

## 2020-02-19 ENCOUNTER — Other Ambulatory Visit: Payer: Self-pay

## 2020-02-19 DIAGNOSIS — I129 Hypertensive chronic kidney disease with stage 1 through stage 4 chronic kidney disease, or unspecified chronic kidney disease: Secondary | ICD-10-CM | POA: Diagnosis not present

## 2020-02-19 DIAGNOSIS — Z952 Presence of prosthetic heart valve: Secondary | ICD-10-CM

## 2020-02-19 DIAGNOSIS — I2510 Atherosclerotic heart disease of native coronary artery without angina pectoris: Secondary | ICD-10-CM | POA: Diagnosis not present

## 2020-02-19 DIAGNOSIS — N183 Chronic kidney disease, stage 3 unspecified: Secondary | ICD-10-CM | POA: Diagnosis not present

## 2020-02-19 DIAGNOSIS — E785 Hyperlipidemia, unspecified: Secondary | ICD-10-CM | POA: Diagnosis not present

## 2020-02-19 DIAGNOSIS — I480 Paroxysmal atrial fibrillation: Secondary | ICD-10-CM | POA: Diagnosis not present

## 2020-02-21 ENCOUNTER — Other Ambulatory Visit: Payer: Self-pay

## 2020-02-21 ENCOUNTER — Encounter (HOSPITAL_COMMUNITY)
Admission: RE | Admit: 2020-02-21 | Discharge: 2020-02-21 | Disposition: A | Discharge status: Home / Self Care | Payer: Medicare Other | Source: Ambulatory Visit | Attending: Cardiology | Admitting: Cardiology

## 2020-02-21 DIAGNOSIS — I129 Hypertensive chronic kidney disease with stage 1 through stage 4 chronic kidney disease, or unspecified chronic kidney disease: Secondary | ICD-10-CM | POA: Diagnosis not present

## 2020-02-21 DIAGNOSIS — Z952 Presence of prosthetic heart valve: Secondary | ICD-10-CM | POA: Diagnosis not present

## 2020-02-21 DIAGNOSIS — I480 Paroxysmal atrial fibrillation: Secondary | ICD-10-CM | POA: Diagnosis not present

## 2020-02-21 DIAGNOSIS — N183 Chronic kidney disease, stage 3 unspecified: Secondary | ICD-10-CM | POA: Diagnosis not present

## 2020-02-21 DIAGNOSIS — I2510 Atherosclerotic heart disease of native coronary artery without angina pectoris: Secondary | ICD-10-CM | POA: Diagnosis not present

## 2020-02-21 DIAGNOSIS — E785 Hyperlipidemia, unspecified: Secondary | ICD-10-CM | POA: Diagnosis not present

## 2020-02-22 ENCOUNTER — Ambulatory Visit (INDEPENDENT_AMBULATORY_CARE_PROVIDER_SITE_OTHER): Payer: Medicare Other | Admitting: Cardiology

## 2020-02-22 ENCOUNTER — Encounter: Payer: Self-pay | Admitting: Cardiology

## 2020-02-22 VITALS — BP 120/66 | HR 59 | Temp 97.2°F | Ht 68.0 in | Wt 180.0 lb

## 2020-02-22 DIAGNOSIS — I6521 Occlusion and stenosis of right carotid artery: Secondary | ICD-10-CM

## 2020-02-22 DIAGNOSIS — Z952 Presence of prosthetic heart valve: Secondary | ICD-10-CM | POA: Diagnosis not present

## 2020-02-22 DIAGNOSIS — E785 Hyperlipidemia, unspecified: Secondary | ICD-10-CM | POA: Diagnosis not present

## 2020-02-22 DIAGNOSIS — R600 Localized edema: Secondary | ICD-10-CM | POA: Diagnosis not present

## 2020-02-22 DIAGNOSIS — I6523 Occlusion and stenosis of bilateral carotid arteries: Secondary | ICD-10-CM | POA: Diagnosis not present

## 2020-02-22 DIAGNOSIS — I10 Essential (primary) hypertension: Secondary | ICD-10-CM | POA: Diagnosis not present

## 2020-02-22 DIAGNOSIS — Z7901 Long term (current) use of anticoagulants: Secondary | ICD-10-CM

## 2020-02-22 DIAGNOSIS — I480 Paroxysmal atrial fibrillation: Secondary | ICD-10-CM | POA: Diagnosis not present

## 2020-02-22 DIAGNOSIS — F5104 Psychophysiologic insomnia: Secondary | ICD-10-CM

## 2020-02-22 DIAGNOSIS — I25119 Atherosclerotic heart disease of native coronary artery with unspecified angina pectoris: Secondary | ICD-10-CM | POA: Diagnosis not present
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
## 2020-02-22 NOTE — Patient Instructions (Signed)
Medication Instructions:   Change and take Imdur  ( isosorbide mononitrate) at lunch time   *If you need a refill on your cardiac medications before your next appointment, please call your pharmacy*   Other Instructions    Talk with your Urologist  About a suction catheter at night  And discuss about taking Melatonin Lab Work:    Not needed    Testing/Procedures: Not needed   Follow-Up: At [facility], you and your health needs are our priority.  As part of our continuing mission to provide you with exceptional heart care, we have created designated Provider Care Teams.  These Care Teams include your primary Cardiologist (physician) and Advanced Practice Providers (APPs -  Physician Assistants and Nurse Practitioners) who all work together to provide you with the care you need, when you need it.  We recommend signing up for the patient portal called "MyChart".  Sign up information is provided on this After Visit Summary.  MyChart is used to connect with patients for Virtual Visits (Telemedicine).  Patients are able to view lab/test results, encounter notes, upcoming appointments, etc.  Non-urgent messages can be sent to your provider as well.   To learn more about what you can do with MyChart, go to [email or web address].    Your next appointment:   4 month(s)  The format for your next appointment:   In Person  Provider:   [name], MD

## 2020-02-23 ENCOUNTER — Encounter (HOSPITAL_COMMUNITY)
Admission: RE | Admit: 2020-02-23 | Discharge: 2020-02-23 | Disposition: A | Discharge status: Home / Self Care | Payer: Medicare Other | Source: Ambulatory Visit | Attending: Cardiology | Admitting: Cardiology

## 2020-02-23 ENCOUNTER — Other Ambulatory Visit: Payer: Self-pay

## 2020-02-23 DIAGNOSIS — Z952 Presence of prosthetic heart valve: Secondary | ICD-10-CM | POA: Diagnosis not present

## 2020-02-23 DIAGNOSIS — I480 Paroxysmal atrial fibrillation: Secondary | ICD-10-CM | POA: Diagnosis not present

## 2020-02-23 DIAGNOSIS — N183 Chronic kidney disease, stage 3 unspecified: Secondary | ICD-10-CM | POA: Diagnosis not present

## 2020-02-23 DIAGNOSIS — I129 Hypertensive chronic kidney disease with stage 1 through stage 4 chronic kidney disease, or unspecified chronic kidney disease: Secondary | ICD-10-CM | POA: Diagnosis not present

## 2020-02-23 DIAGNOSIS — E785 Hyperlipidemia, unspecified: Secondary | ICD-10-CM | POA: Diagnosis not present

## 2020-02-23 DIAGNOSIS — I2510 Atherosclerotic heart disease of native coronary artery without angina pectoris: Secondary | ICD-10-CM | POA: Diagnosis not present

## 2020-02-26 ENCOUNTER — Other Ambulatory Visit: Payer: Self-pay

## 2020-02-26 ENCOUNTER — Encounter (HOSPITAL_COMMUNITY)
Admission: RE | Admit: 2020-02-26 | Discharge: 2020-02-26 | Disposition: A | Discharge status: Home / Self Care | Payer: Medicare Other | Source: Ambulatory Visit | Attending: Cardiology | Admitting: Cardiology

## 2020-02-26 DIAGNOSIS — Z8546 Personal history of malignant neoplasm of prostate: Secondary | ICD-10-CM | POA: Diagnosis not present

## 2020-02-26 DIAGNOSIS — Z952 Presence of prosthetic heart valve: Secondary | ICD-10-CM | POA: Diagnosis not present

## 2020-02-26 DIAGNOSIS — I129 Hypertensive chronic kidney disease with stage 1 through stage 4 chronic kidney disease, or unspecified chronic kidney disease: Secondary | ICD-10-CM | POA: Diagnosis not present

## 2020-02-26 DIAGNOSIS — E785 Hyperlipidemia, unspecified: Secondary | ICD-10-CM | POA: Diagnosis not present

## 2020-02-26 DIAGNOSIS — I2510 Atherosclerotic heart disease of native coronary artery without angina pectoris: Secondary | ICD-10-CM | POA: Diagnosis not present

## 2020-02-26 DIAGNOSIS — I480 Paroxysmal atrial fibrillation: Secondary | ICD-10-CM | POA: Diagnosis not present

## 2020-02-26 DIAGNOSIS — R350 Frequency of micturition: Secondary | ICD-10-CM | POA: Diagnosis not present

## 2020-02-26 DIAGNOSIS — N183 Chronic kidney disease, stage 3 unspecified: Secondary | ICD-10-CM | POA: Diagnosis not present

## 2020-02-27 NOTE — Progress Notes (Signed)
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 modify interventions until short term weight is achieved;Long Term: Adherence to nutrition and physical activity/exercise program aimed toward attainment of established weight goal;Weight Loss: Understanding of general recommendations for a balanced deficit meal plan, which promotes 1-2 lb weight loss per week and includes a negative energy balance of  [phone] kcal/d;Understanding recommendations for meals to include 15-35% energy as protein, 25-35% energy from fat, 35-60% energy from carbohydrates, less than 265m of dietary cholesterol, 20-35 gm of total fiber daily;Understanding of distribution of calorie intake throughout the day with the consumption of 4-5 meals/snacks     Hypertension Yes    Intervention Provide education on lifestyle modifcations including regular physical activity/exercise, weight management, moderate sodium restriction and increased consumption of fresh fruit, vegetables, and low fat dairy, alcohol moderation, and smoking cessation.;Monitor prescription use compliance.    Expected Outcomes Short Term: Continued assessment and intervention until BP is < 140/928mHG in hypertensive participants. < 130/8016mG in hypertensive participants with diabetes, heart failure or chronic kidney disease.;Long Term: Maintenance of blood pressure at goal levels.    Lipids Yes    Intervention Provide education and support for participant on nutrition & aerobic/resistive exercise along with prescribed medications to achieve LDL <60m54mDL >40mg46m Expected Outcomes Short Term: Participant states understanding of desired cholesterol values and is compliant with medications prescribed. Participant is following exercise prescription and nutrition guidelines.;Long Term: Cholesterol controlled with medications as prescribed, with individualized exercise RX and with personalized nutrition plan. Value goals: LDL < 60mg,46m > 40 mg.           Core Components/Risk Factors/Patient Goals Review:   Goals and Risk Factor Review    Row Name 01/29/20 1211 02/27/20 1612           Core Components/Risk Factors/Patient Goals Review   Personal Goals Review Weight Management/Obesity;Hypertension;Lipids;Stress Weight Management/Obesity;Hypertension;Lipids;Stress      Review Mr Branson Mckamieed exercise on 01/29/20 and exercised without diffculty at a light level Mr Fister Lakereen doing well with exercise for his fitlness level. Vital signs have been stable. Mr Taha'Herneevel has increased slowly      Expected Outcomes Patient will continue to participate in phase 2 cardiac rehab for exercise nutrtion and lifstyle modifications Patient will continue to participate in phase 2 cardiac  rehab for exercise nutrtion and lifstyle modifications             Core Components/Risk Factors/Patient Goals at Discharge (Final Review):   Goals and Risk Factor Review - 02/27/20 1612      Core Components/Risk Factors/Patient Goals Review   Personal Goals Review Weight Management/Obesity;Hypertension;Lipids;Stress    Review Mr Gianino Castillejaeen doing well with exercise for his fitlness level. Vital signs have been stable. Mr Mairena's met level has increased slowly    Expected Outcomes Patient will continue to participate in phase 2 cardiac rehab for exercise nutrtion and lifstyle modifications           ITP Comments:  ITP Comments    Row Name 01/25/20 0921 [id number] 02/27/20 1603       ITP Comments Dr Traci Fransico Himedical Director 30 Day ITP Review. Mr Parchment Platteated his first day of exercise at cardiac rehab without diffculty at a light level 30 Day ITP Review. Mr Hollister Whiteleyood attendance and participation in phase 2 cardiac rehab            Comments: See ITP comments.Aleenah Homen Barnet PallSN 02/29/2020 11:57 AM

## 2020-02-28 ENCOUNTER — Other Ambulatory Visit: Payer: Self-pay

## 2020-02-28 ENCOUNTER — Encounter: Payer: Self-pay | Admitting: Cardiology

## 2020-02-28 ENCOUNTER — Encounter (HOSPITAL_COMMUNITY)
Admission: RE | Admit: 2020-02-28 | Discharge: 2020-02-28 | Disposition: A | Discharge status: Home / Self Care | Payer: Medicare Other | Source: Ambulatory Visit | Attending: Cardiology | Admitting: Cardiology

## 2020-02-28 DIAGNOSIS — Z952 Presence of prosthetic heart valve: Secondary | ICD-10-CM | POA: Diagnosis not present

## 2020-02-28 DIAGNOSIS — F5104 Psychophysiologic insomnia: Secondary | ICD-10-CM | POA: Insufficient documentation

## 2020-02-28 DIAGNOSIS — I129 Hypertensive chronic kidney disease with stage 1 through stage 4 chronic kidney disease, or unspecified chronic kidney disease: Secondary | ICD-10-CM | POA: Diagnosis not present

## 2020-02-28 DIAGNOSIS — I2510 Atherosclerotic heart disease of native coronary artery without angina pectoris: Secondary | ICD-10-CM | POA: Diagnosis not present

## 2020-02-28 DIAGNOSIS — E785 Hyperlipidemia, unspecified: Secondary | ICD-10-CM | POA: Diagnosis not present

## 2020-02-28 DIAGNOSIS — N183 Chronic kidney disease, stage 3 unspecified: Secondary | ICD-10-CM | POA: Diagnosis not present

## 2020-02-28 DIAGNOSIS — I480 Paroxysmal atrial fibrillation: Secondary | ICD-10-CM | POA: Diagnosis not present

## 2020-02-28 NOTE — Assessment & Plan Note (Signed)
Lipids look great as of April.  Continue current dose of statin.

## 2020-02-28 NOTE — Assessment & Plan Note (Signed)
He has paroxysmal A. fib, but was pretty asymptomatic when he had the episode on monitor.  He is on Eliquis and on diltiazem.  Stable dose of diltiazem which we are using in lieu of beta-blocker because of fatigue.  He had no pauses or significant bradycardia/heart block on monitor.  Was totally asymptomatic while in A. Fib, which would argue against his atypical symptoms being related to A. fib.  Due to lack of symptoms, I think we can just continue on with diltiazem for rate control and reduced dose Eliquis for anticoagulation.

## 2020-02-28 NOTE — Assessment & Plan Note (Signed)
No bleeding on the combination of Eliquis and Plavix.  I do think that it may not be unreasonable consideration to stop Plavix when safe at 6 months post TAVR.  We would then simply just continue Eliquis.

## 2020-02-28 NOTE — Assessment & Plan Note (Signed)
Now followed by Dr. Trula Slade along with the aortic lesion.

## 2020-02-28 NOTE — Assessment & Plan Note (Signed)
He describes daytime sleepiness, but is not sleeping at night.  He is getting intermittent episodes of maybe 30 to 40 minutes of sleep at the most an hour.  I would be reluctant to put him on Lunesta or zolpidem.  I do think though that he needs some type of sleep aid and I suggested using melatonin as an option.  I think if he can get a decent night of sleep, he will feel better during the day.  This will help his energy level and will help his mood as well.

## 2020-02-28 NOTE — Assessment & Plan Note (Signed)
He is wearing support stockings which seems to help.  He does have Lasix which he takes a low-dose daily.  He does not take a full pill.  I told him that if his edema is worse to take a full pill.  Not really associate with PND orthopnea, I think is probably more venous stasis.

## 2020-02-28 NOTE — Assessment & Plan Note (Addendum)
Single-vessel disease with patent stents in September last year. He currently is having this weird epigastric symptom which is not similar to his previous anginal symptoms.  It is not exertional. I am not inclined to do an ischemic evaluation for now.  We will do a trial of having him take his Imdur at lunch so it is closer to the timing of what this episode occurs.  This is with the thought that he has had alleviation of symptoms with nitroglycerin.  Location and description does not sound like angina.  If symptoms do worsen, then I think we would probably want to check potentially a Myoview.  Otherwise he is on diltiazem and the beta-blocker, is on Plavix and statin along with Imdur.

## 2020-02-28 NOTE — Assessment & Plan Note (Signed)
Follow-up echocardiogram looks pretty good.  Very interesting how his invasive and noninvasive findings did not show classic severe AS.  He was low gradient low flow.  TAVR valve now placed and is on Plavix with reduced dose Eliquis.  Would anticipate the Plavix is for 6 months and then can stop and go back to just plain Eliquis.

## 2020-02-28 NOTE — Progress Notes (Signed)
I have reviewed a Home Exercise Prescription with Mark Jackson. Mark Jackson Kitchen Mark Jackson is  currently exercising at home. The patient was advised to continue to walk 4-5 days a week for 20-30 minutes.  Mark Jackson and I discussed how to progress their exercise prescription. The patient stated that they understand the exercise prescription. We reviewed exercise guidelines, target heart rate during exercise, RPE Scale, weather conditions, NTG use, endpoints for exercise, warmup and cool down. Patient is encouraged to come to me with any questions. I will continue to follow up with the patient to assist them with progression and safety.     4:31 PM 02/28/2020 4:31 PM

## 2020-02-28 NOTE — Assessment & Plan Note (Signed)
Blood pressure actually is really pretty well controlled.  He has had some hypertension but also some hypotension.  For now would like to just simply stay with diltiazem.  While guidelines would suggest goal blood pressures less than 140/90, truth be told, with his age, higher than that blood pressures as long as he not in the greater than 180,000,000 mercury range sustained are probably okay.  Would be reluctant to titrate medications further because of the other issue of hypotension which could be related to antihypertensive agents.

## 2020-03-01 ENCOUNTER — Encounter (HOSPITAL_COMMUNITY)
Admission: RE | Admit: 2020-03-01 | Discharge: 2020-03-01 | Disposition: A | Discharge status: Home / Self Care | Payer: Medicare Other | Source: Ambulatory Visit | Attending: Cardiology | Admitting: Cardiology

## 2020-03-01 ENCOUNTER — Other Ambulatory Visit: Payer: Self-pay

## 2020-03-01 DIAGNOSIS — Z952 Presence of prosthetic heart valve: Secondary | ICD-10-CM

## 2020-03-01 DIAGNOSIS — N183 Chronic kidney disease, stage 3 unspecified: Secondary | ICD-10-CM | POA: Diagnosis not present

## 2020-03-01 DIAGNOSIS — E785 Hyperlipidemia, unspecified: Secondary | ICD-10-CM | POA: Diagnosis not present

## 2020-03-01 DIAGNOSIS — I480 Paroxysmal atrial fibrillation: Secondary | ICD-10-CM | POA: Diagnosis not present

## 2020-03-01 DIAGNOSIS — I129 Hypertensive chronic kidney disease with stage 1 through stage 4 chronic kidney disease, or unspecified chronic kidney disease: Secondary | ICD-10-CM | POA: Diagnosis not present

## 2020-03-01 DIAGNOSIS — I2510 Atherosclerotic heart disease of native coronary artery without angina pectoris: Secondary | ICD-10-CM | POA: Diagnosis not present

## 2020-03-02 ENCOUNTER — Other Ambulatory Visit: Payer: Self-pay | Admitting: Cardiology

## 2020-03-04 ENCOUNTER — Encounter (HOSPITAL_COMMUNITY)
Admission: RE | Admit: 2020-03-04 | Discharge: 2020-03-04 | Disposition: A | Discharge status: Home / Self Care | Payer: Medicare Other | Source: Ambulatory Visit | Attending: Cardiology | Admitting: Cardiology

## 2020-03-04 ENCOUNTER — Other Ambulatory Visit: Payer: Self-pay

## 2020-03-04 DIAGNOSIS — Z952 Presence of prosthetic heart valve: Secondary | ICD-10-CM

## 2020-03-04 DIAGNOSIS — I480 Paroxysmal atrial fibrillation: Secondary | ICD-10-CM | POA: Diagnosis not present

## 2020-03-04 DIAGNOSIS — E785 Hyperlipidemia, unspecified: Secondary | ICD-10-CM | POA: Diagnosis not present

## 2020-03-04 DIAGNOSIS — I2510 Atherosclerotic heart disease of native coronary artery without angina pectoris: Secondary | ICD-10-CM | POA: Diagnosis not present

## 2020-03-04 DIAGNOSIS — N183 Chronic kidney disease, stage 3 unspecified: Secondary | ICD-10-CM | POA: Diagnosis not present

## 2020-03-04 DIAGNOSIS — I129 Hypertensive chronic kidney disease with stage 1 through stage 4 chronic kidney disease, or unspecified chronic kidney disease: Secondary | ICD-10-CM | POA: Diagnosis not present

## 2020-03-06 ENCOUNTER — Other Ambulatory Visit: Payer: Self-pay

## 2020-03-06 ENCOUNTER — Encounter (HOSPITAL_COMMUNITY)
Admission: RE | Admit: 2020-03-06 | Discharge: 2020-03-06 | Disposition: A | Discharge status: Home / Self Care | Payer: Medicare Other | Source: Ambulatory Visit | Attending: Cardiology | Admitting: Cardiology

## 2020-03-06 DIAGNOSIS — N183 Chronic kidney disease, stage 3 unspecified: Secondary | ICD-10-CM | POA: Diagnosis not present

## 2020-03-06 DIAGNOSIS — I2510 Atherosclerotic heart disease of native coronary artery without angina pectoris: Secondary | ICD-10-CM | POA: Diagnosis not present

## 2020-03-06 DIAGNOSIS — I480 Paroxysmal atrial fibrillation: Secondary | ICD-10-CM | POA: Diagnosis not present

## 2020-03-06 DIAGNOSIS — Z952 Presence of prosthetic heart valve: Secondary | ICD-10-CM | POA: Diagnosis not present

## 2020-03-06 DIAGNOSIS — E785 Hyperlipidemia, unspecified: Secondary | ICD-10-CM | POA: Diagnosis not present

## 2020-03-06 DIAGNOSIS — I129 Hypertensive chronic kidney disease with stage 1 through stage 4 chronic kidney disease, or unspecified chronic kidney disease: Secondary | ICD-10-CM | POA: Diagnosis not present

## 2020-03-08 ENCOUNTER — Other Ambulatory Visit: Payer: Self-pay

## 2020-03-08 ENCOUNTER — Encounter (HOSPITAL_COMMUNITY)
Admission: RE | Admit: 2020-03-08 | Discharge: 2020-03-08 | Disposition: A | Discharge status: Home / Self Care | Payer: Medicare Other | Source: Ambulatory Visit | Attending: Cardiology | Admitting: Cardiology

## 2020-03-08 DIAGNOSIS — Z952 Presence of prosthetic heart valve: Secondary | ICD-10-CM

## 2020-03-08 DIAGNOSIS — N183 Chronic kidney disease, stage 3 unspecified: Secondary | ICD-10-CM | POA: Diagnosis not present

## 2020-03-08 DIAGNOSIS — I2510 Atherosclerotic heart disease of native coronary artery without angina pectoris: Secondary | ICD-10-CM | POA: Diagnosis not present

## 2020-03-08 DIAGNOSIS — I129 Hypertensive chronic kidney disease with stage 1 through stage 4 chronic kidney disease, or unspecified chronic kidney disease: Secondary | ICD-10-CM | POA: Diagnosis not present

## 2020-03-08 DIAGNOSIS — I480 Paroxysmal atrial fibrillation: Secondary | ICD-10-CM | POA: Diagnosis not present

## 2020-03-08 DIAGNOSIS — E785 Hyperlipidemia, unspecified: Secondary | ICD-10-CM | POA: Diagnosis not present

## 2020-03-11 ENCOUNTER — Other Ambulatory Visit: Payer: Self-pay

## 2020-03-11 ENCOUNTER — Encounter (HOSPITAL_COMMUNITY)
Admission: RE | Admit: 2020-03-11 | Discharge: 2020-03-11 | Disposition: A | Discharge status: Home / Self Care | Payer: Medicare Other | Source: Ambulatory Visit | Attending: Cardiology | Admitting: Cardiology

## 2020-03-11 DIAGNOSIS — E785 Hyperlipidemia, unspecified: Secondary | ICD-10-CM | POA: Diagnosis not present

## 2020-03-11 DIAGNOSIS — Z6830 Body mass index (BMI) 30.0-30.9, adult: Secondary | ICD-10-CM | POA: Insufficient documentation

## 2020-03-11 DIAGNOSIS — I129 Hypertensive chronic kidney disease with stage 1 through stage 4 chronic kidney disease, or unspecified chronic kidney disease: Secondary | ICD-10-CM | POA: Insufficient documentation

## 2020-03-11 DIAGNOSIS — Z7901 Long term (current) use of anticoagulants: Secondary | ICD-10-CM | POA: Insufficient documentation

## 2020-03-11 DIAGNOSIS — N183 Chronic kidney disease, stage 3 unspecified: Secondary | ICD-10-CM | POA: Diagnosis not present

## 2020-03-11 DIAGNOSIS — Z7902 Long term (current) use of antithrombotics/antiplatelets: Secondary | ICD-10-CM | POA: Diagnosis not present

## 2020-03-11 DIAGNOSIS — E669 Obesity, unspecified: Secondary | ICD-10-CM | POA: Insufficient documentation

## 2020-03-11 DIAGNOSIS — I2510 Atherosclerotic heart disease of native coronary artery without angina pectoris: Secondary | ICD-10-CM | POA: Insufficient documentation

## 2020-03-11 DIAGNOSIS — I480 Paroxysmal atrial fibrillation: Secondary | ICD-10-CM | POA: Diagnosis not present

## 2020-03-11 DIAGNOSIS — Z952 Presence of prosthetic heart valve: Secondary | ICD-10-CM | POA: Insufficient documentation

## 2020-03-11 DIAGNOSIS — Z79899 Other long term (current) drug therapy: Secondary | ICD-10-CM | POA: Insufficient documentation

## 2020-03-11 DIAGNOSIS — Z87891 Personal history of nicotine dependence: Secondary | ICD-10-CM | POA: Insufficient documentation

## 2020-03-12 ENCOUNTER — Ambulatory Visit (INDEPENDENT_AMBULATORY_CARE_PROVIDER_SITE_OTHER): Payer: Medicare Other

## 2020-03-12 DIAGNOSIS — E538 Deficiency of other specified B group vitamins: Secondary | ICD-10-CM

## 2020-03-12 MED ORDER — CYANOCOBALAMIN 1000 MCG/ML IJ SOLN
1000.0000 ug | Freq: Once | INTRAMUSCULAR | Status: AC
Start: 2020-03-12 — End: 2020-03-12
  Administered 2020-03-12: 1000 ug via INTRAMUSCULAR

## 2020-03-12 NOTE — Progress Notes (Signed)
Patient came into the office receive his Vitamin B12 injection. Patient tolerated the injection well.

## 2020-03-12 NOTE — Progress Notes (Signed)
I have reviewed and agree with note, evaluation, plan.   Julien Oscar, MD  

## 2020-03-13 ENCOUNTER — Telehealth: Payer: Self-pay

## 2020-03-13 ENCOUNTER — Telehealth: Payer: Self-pay | Admitting: Cardiology

## 2020-03-13 ENCOUNTER — Other Ambulatory Visit: Payer: Self-pay

## 2020-03-13 ENCOUNTER — Encounter (HOSPITAL_COMMUNITY)
Admission: RE | Admit: 2020-03-13 | Discharge: 2020-03-13 | Disposition: A | Discharge status: Home / Self Care | Payer: Medicare Other | Source: Ambulatory Visit | Attending: Cardiology | Admitting: Cardiology

## 2020-03-13 DIAGNOSIS — I129 Hypertensive chronic kidney disease with stage 1 through stage 4 chronic kidney disease, or unspecified chronic kidney disease: Secondary | ICD-10-CM | POA: Diagnosis not present

## 2020-03-13 DIAGNOSIS — Z952 Presence of prosthetic heart valve: Secondary | ICD-10-CM

## 2020-03-13 DIAGNOSIS — E785 Hyperlipidemia, unspecified: Secondary | ICD-10-CM | POA: Diagnosis not present

## 2020-03-13 DIAGNOSIS — N183 Chronic kidney disease, stage 3 unspecified: Secondary | ICD-10-CM | POA: Diagnosis not present

## 2020-03-13 DIAGNOSIS — I480 Paroxysmal atrial fibrillation: Secondary | ICD-10-CM | POA: Diagnosis not present

## 2020-03-13 DIAGNOSIS — I2510 Atherosclerotic heart disease of native coronary artery without angina pectoris: Secondary | ICD-10-CM | POA: Diagnosis not present

## 2020-03-13 NOTE — Telephone Encounter (Signed)
Patient wanted to know if he should not get the covid vaccination still for himself and wife. And if you do want him to get now what one do you recommend.

## 2020-03-13 NOTE — Telephone Encounter (Signed)
Son of the patient called. The patient would like to start a Cardiac Rehab Maintenance Program at [facility] The only way the patient can be enrolled is to get a referral from a local Doctor. He was hoping Dr.Harding would provide the referral. This is a different program than the patient is enrolled in right now. Please let the Son know if there is anything else he needs to do

## 2020-03-13 NOTE — Telephone Encounter (Signed)
Called patient gave information will call if any questions.

## 2020-03-13 NOTE — Telephone Encounter (Signed)
Left message to call back  

## 2020-03-13 NOTE — Telephone Encounter (Signed)
I think it is a harder decision for her since she is on hospice but if they would like to get it for her I think that is reasonable-I do believe we have a home-based program where they can come out and give the vaccination.  For him-I would recommend the vaccination and I would either do Pfizer or [employer] vaccination

## 2020-03-14 DIAGNOSIS — Z23 Encounter for immunization: Secondary | ICD-10-CM | POA: Diagnosis not present

## 2020-03-14 NOTE — Telephone Encounter (Signed)
Left message for patients son, we can do the referral and to touch base with the folks at rehab because there is usually an order that has to be signed by dr harding. He is to call back if needed.

## 2020-03-15 ENCOUNTER — Other Ambulatory Visit: Payer: Self-pay

## 2020-03-15 ENCOUNTER — Encounter (HOSPITAL_COMMUNITY)
Admission: RE | Admit: 2020-03-15 | Discharge: 2020-03-15 | Disposition: A | Discharge status: Home / Self Care | Payer: Medicare Other | Source: Ambulatory Visit | Attending: Cardiology | Admitting: Cardiology

## 2020-03-15 DIAGNOSIS — Z952 Presence of prosthetic heart valve: Secondary | ICD-10-CM

## 2020-03-15 DIAGNOSIS — N183 Chronic kidney disease, stage 3 unspecified: Secondary | ICD-10-CM | POA: Diagnosis not present

## 2020-03-15 DIAGNOSIS — I480 Paroxysmal atrial fibrillation: Secondary | ICD-10-CM | POA: Diagnosis not present

## 2020-03-15 DIAGNOSIS — I2510 Atherosclerotic heart disease of native coronary artery without angina pectoris: Secondary | ICD-10-CM | POA: Diagnosis not present

## 2020-03-15 DIAGNOSIS — E785 Hyperlipidemia, unspecified: Secondary | ICD-10-CM | POA: Diagnosis not present

## 2020-03-15 DIAGNOSIS — I129 Hypertensive chronic kidney disease with stage 1 through stage 4 chronic kidney disease, or unspecified chronic kidney disease: Secondary | ICD-10-CM | POA: Diagnosis not present

## 2020-03-18 ENCOUNTER — Other Ambulatory Visit: Payer: Self-pay

## 2020-03-18 ENCOUNTER — Encounter (HOSPITAL_COMMUNITY)
Admission: RE | Admit: 2020-03-18 | Discharge: 2020-03-18 | Disposition: A | Discharge status: Home / Self Care | Payer: Medicare Other | Source: Ambulatory Visit | Attending: Cardiology | Admitting: Cardiology

## 2020-03-18 DIAGNOSIS — Z952 Presence of prosthetic heart valve: Secondary | ICD-10-CM | POA: Diagnosis not present

## 2020-03-18 DIAGNOSIS — I2510 Atherosclerotic heart disease of native coronary artery without angina pectoris: Secondary | ICD-10-CM | POA: Diagnosis not present

## 2020-03-18 DIAGNOSIS — E785 Hyperlipidemia, unspecified: Secondary | ICD-10-CM | POA: Diagnosis not present

## 2020-03-18 DIAGNOSIS — I129 Hypertensive chronic kidney disease with stage 1 through stage 4 chronic kidney disease, or unspecified chronic kidney disease: Secondary | ICD-10-CM | POA: Diagnosis not present

## 2020-03-18 DIAGNOSIS — N183 Chronic kidney disease, stage 3 unspecified: Secondary | ICD-10-CM | POA: Diagnosis not present

## 2020-03-18 DIAGNOSIS — I480 Paroxysmal atrial fibrillation: Secondary | ICD-10-CM | POA: Diagnosis not present

## 2020-03-20 ENCOUNTER — Other Ambulatory Visit: Payer: Self-pay

## 2020-03-20 ENCOUNTER — Encounter (HOSPITAL_COMMUNITY)
Admission: RE | Admit: 2020-03-20 | Discharge: 2020-03-20 | Disposition: A | Discharge status: Home / Self Care | Payer: Medicare Other | Source: Ambulatory Visit | Attending: Cardiology | Admitting: Cardiology

## 2020-03-20 DIAGNOSIS — N183 Chronic kidney disease, stage 3 unspecified: Secondary | ICD-10-CM | POA: Diagnosis not present

## 2020-03-20 DIAGNOSIS — I129 Hypertensive chronic kidney disease with stage 1 through stage 4 chronic kidney disease, or unspecified chronic kidney disease: Secondary | ICD-10-CM | POA: Diagnosis not present

## 2020-03-20 DIAGNOSIS — Z952 Presence of prosthetic heart valve: Secondary | ICD-10-CM | POA: Diagnosis not present

## 2020-03-20 DIAGNOSIS — E785 Hyperlipidemia, unspecified: Secondary | ICD-10-CM | POA: Diagnosis not present

## 2020-03-20 DIAGNOSIS — I480 Paroxysmal atrial fibrillation: Secondary | ICD-10-CM | POA: Diagnosis not present

## 2020-03-20 DIAGNOSIS — I2510 Atherosclerotic heart disease of native coronary artery without angina pectoris: Secondary | ICD-10-CM | POA: Diagnosis not present

## 2020-03-21 NOTE — Progress Notes (Signed)
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 smoking, and the definition of COPD.   Nutrition I: Fats -Discuss the types of cholesterol, what cholesterol does to the heart, and how cholesterol levels can be controlled.   Nutrition II: Labels -Discuss the different components of food labels and how to read food label   Heart Parts/Heart Disease and PAD -Discuss the anatomy of the heart, the pathway of blood circulation through the heart,  and these are affected by heart disease.   Stress I: Signs and Symptoms -Discuss the causes of stress, how stress may lead to anxiety and depression, and ways to limit stress.   Stress II: Relaxation -Discuss different types of relaxation techniques to limit stress.   Warning Signs of Stroke / TIA -Discuss  definition of a stroke, what the signs and symptoms are of a stroke, and how to identify when someone is having stroke.   Knowledge Questionnaire Score:  Knowledge Questionnaire Score - 01/25/20 1040      Knowledge Questionnaire Score   Pre Score 21/24           Core Components/Risk Factors/Patient Goals at Admission:  Personal Goals and Risk Factors at Admission - 01/25/20 1042      Core Components/Risk Factors/Patient Goals on Admission    Weight Management Yes;Weight Loss    Intervention Weight Management: Develop a combined nutrition and exercise program designed to reach desired caloric intake, while maintaining appropriate intake of nutrient and fiber, sodium and fats, and appropriate energy expenditure required for the weight goal.;Weight Management: Provide education and appropriate resources to help participant work on and attain dietary goals.;Weight Management/Obesity: Establish reasonable short term and long term weight goals.    Admit Weight 181 lb 3.8 oz (82.2 kg)    Expected Outcomes Short Term: Continue to assess and modify interventions until short term weight is achieved;Long Term: Adherence to nutrition and physical activity/exercise program aimed toward attainment of established weight goal;Weight Loss: Understanding of general recommendations for a balanced deficit meal plan, which promotes 1-2 lb weight loss per week and includes a negative energy balance of [phone] kcal/d;Understanding recommendations for meals to include 15-35% energy as protein, 25-35% energy from fat, 35-60% energy from carbohydrates, less than 268m of dietary cholesterol, 20-35 gm of total fiber daily;Understanding of distribution of calorie intake throughout the day with the consumption of 4-5 meals/snacks    Hypertension Yes    Intervention Provide education on lifestyle modifcations including regular physical activity/exercise, weight management, moderate sodium restriction and increased  consumption of fresh fruit, vegetables, and low fat dairy, alcohol moderation, and smoking cessation.;Monitor prescription use compliance.    Expected Outcomes Short Term: Continued assessment and intervention until BP is < 140/956mHG in hypertensive participants. < 130/8021mG in hypertensive participants with diabetes, heart failure or chronic kidney disease.;Long Term: Maintenance of blood pressure at goal levels.    Lipids Yes    Intervention Provide education and support for participant on nutrition & aerobic/resistive exercise along with prescribed medications to achieve LDL <73m42mDL >40mg30m Expected Outcomes Short Term: Participant states understanding of desired cholesterol values and is compliant with medications prescribed. Participant is following exercise prescription and nutrition guidelines.;Long Term: Cholesterol controlled with medications as prescribed, with individualized exercise RX and with personalized nutrition plan. Value goals: LDL < 73mg,8m > 40 mg.           Core Components/Risk Factors/Patient Goals Review:   Goals and Risk Factor Review    Row Name 01/29/20 1211 02/27/20 1612 03/21/20 1606         Core Components/Risk Factors/Patient Goals Review   Personal Goals Review Weight Management/Obesity;Hypertension;Lipids;Stress Weight Management/Obesity;Hypertension;Lipids;Stress Weight Management/Obesity;Hypertension;Lipids;Stress     Review Mr Lamour Capraroed exercise on 01/29/20 and exercised without diffculty at a light level Mr Totzke Evingereen doing well with exercise for his fitlness level. Vital signs have been stable. Mr Umphlett'Rosenboomevel has increased slowly Mr Ozimek Pacereen  doing well with exercise for his fitlness level. Vital signs have been stable. Mr Kapur met level has increased slowly. Mr Clearman graduates from cardiac rehab on 03/21/20     Expected Outcomes Patient will continue to participate in phase 2 cardiac rehab for exercise nutrtion and lifstyle  modifications Patient will continue to participate in phase 2 cardiac rehab for exercise nutrtion and lifstyle modifications Patient will continue to participate in phase 2 cardiac rehab for exercise nutrtion and lifstyle modifications            Core Components/Risk Factors/Patient Goals at Discharge (Final Review):   Goals and Risk Factor Review - 03/21/20 1606      Core Components/Risk Factors/Patient Goals Review   Personal Goals Review Weight Management/Obesity;Hypertension;Lipids;Stress    Review Mr Cremer has been doing well with exercise for his fitlness level. Vital signs have been stable. Mr Touchet met level has increased slowly. Mr Haskew graduates from cardiac rehab on 03/21/20    Expected Outcomes Patient will continue to participate in phase 2 cardiac rehab for exercise nutrtion and lifstyle modifications           ITP Comments:  ITP Comments    Row Name 01/25/20 5848 01/29/20 1209 02/27/20 1603 03/21/20 1604     ITP Comments Dr Fransico Him MD, Medical Director 30 Day ITP Review. Mr Yurkovich tolerated his first day of exercise at cardiac rehab without diffculty at a light level 30 Day ITP Review. Mr Ratz has good attendance and participation in phase 2 cardiac rehab 30 Day ITP Review. Mr Grainger has good attendance and participation in phase 2 cardiac rehab. Mr Hoaglin completes cardiac rehab tomorrow.           Comments: See ITP comments.Barnet Pall, RN,BSN 03/21/2020 4:09 PM

## 2020-03-22 ENCOUNTER — Other Ambulatory Visit: Payer: Self-pay

## 2020-03-22 ENCOUNTER — Encounter (HOSPITAL_COMMUNITY)
Admission: RE | Admit: 2020-03-22 | Discharge: 2020-03-22 | Disposition: A | Discharge status: Home / Self Care | Payer: Medicare Other | Source: Ambulatory Visit | Attending: Cardiology | Admitting: Cardiology

## 2020-03-22 DIAGNOSIS — Z952 Presence of prosthetic heart valve: Secondary | ICD-10-CM

## 2020-03-22 DIAGNOSIS — N183 Chronic kidney disease, stage 3 unspecified: Secondary | ICD-10-CM | POA: Diagnosis not present

## 2020-03-22 DIAGNOSIS — E785 Hyperlipidemia, unspecified: Secondary | ICD-10-CM | POA: Diagnosis not present

## 2020-03-22 DIAGNOSIS — I129 Hypertensive chronic kidney disease with stage 1 through stage 4 chronic kidney disease, or unspecified chronic kidney disease: Secondary | ICD-10-CM | POA: Diagnosis not present

## 2020-03-22 DIAGNOSIS — I2510 Atherosclerotic heart disease of native coronary artery without angina pectoris: Secondary | ICD-10-CM | POA: Diagnosis not present

## 2020-03-22 DIAGNOSIS — I480 Paroxysmal atrial fibrillation: Secondary | ICD-10-CM | POA: Diagnosis not present

## 2020-03-22 NOTE — Progress Notes (Signed)
Discharge Progress Report  Patient Details  Name: Mark Jackson. MRN: [id number] Date of Birth: 01-23-1930 Referring Provider:     CARDIAC REHAB PHASE II ORIENTATION from 01/25/2020 in [facility]  Referring Provider Bryan Lemma       Number of Visits: 22  Reason for Discharge:  Patient reached a stable level of exercise. Patient has met program and personal goals.  Smoking History:  Social History   Tobacco Use  Smoking Status Former Smoker  . Years: 15.00  . Types: Pipe, Cigars  . Quit date: 08/10/1968  . 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  ADL [facility]:   Initial Exercise Prescription:  Initial Exercise Prescription - 01/25/20 1000      Date of Initial Exercise RX and Referring Provider   Date 01/25/20    Referring Provider Bryan Lemma    Expected Discharge Date 03/22/20      NuStep   Level 1    SPM 65    Minutes 25    METs 1.6      Prescription Details   Frequency (times per week) 3    Duration Progress to 30 minutes of continuous aerobic without signs/symptoms of physical distress      Intensity   THRR 40-80% of Max Heartrate 53-106    Ratings of Perceived Exertion 11-13    Perceived Dyspnea 0-4      Progression   Progression Continue progressive overload as per policy without signs/symptoms or physical distress.      Resistance Training   Training Prescription Yes    Weight 2 lbs    Reps 10-15           Discharge Exercise Prescription (Final Exercise Prescription Changes):  Exercise Prescription Changes - 03/22/20 0749      Response to Exercise   Blood Pressure (Admit) 142/72    Blood Pressure (Exercise) 130/80    Blood Pressure (Exit) 142/78    Heart Rate (Admit) 82 bpm    Heart Rate (Exercise) 91 bpm    Heart Rate (Exit) 81 bpm    Rating of Perceived Exertion (Exercise) 12    Perceived Dyspnea  (Exercise) 0    Symptoms None    Comments Pt's last day of exercise    Duration Continue with 30 min of aerobic exercise without signs/symptoms of physical distress.    Intensity THRR unchanged      Progression   Progression Continue to progress workloads to maintain intensity without signs/symptoms of physical distress.    Average METs 2.3      Resistance Training   Training Prescription Yes    Weight 3lbs    Reps 10-15    Time 10 Minutes      Interval Training   Interval Training No      NuStep   Level 2    SPM 85    Minutes 25    METs 2.3      Home Exercise Plan   Plans to continue exercise at Home (comment)   Walking   Frequency Add 3 additional days to program exercise sessions.    Initial Home Exercises Provided 02/28/20           Functional Capacity:  6 Minute Walk    Row Name 01/25/20 1024 03/13/20 1113       6 Minute Walk   Phase Initial Discharge    Distance 1028  feet 1067 feet    Distance % Change -- 3.79 %    Distance Feet Change -- 39 ft    Walk Time 6 minutes 6 minutes    # of Rest Breaks 0 0    MPH 1.94 2.02    METS 1.55 1.76    RPE 12 11    Perceived Dyspnea  2 0    VO2 Peak 5.45 6.16    Symptoms Yes (comment) No    Comments Some SOB RPD = 2 --    Resting HR 81 bpm 77 bpm    Resting BP 128/72 144/74    Resting Oxygen Saturation  100 % --    Exercise Oxygen Saturation  during 6 min walk 100 % --    Max Ex. HR 95 bpm 94 bpm    Max Ex. BP 144/78 162/68    2 Minute Post BP 132/76 104/70           Psychological, QOL, Others - Outcomes: PHQ 2/9: Depression screen [facility] 2/9 03/22/2020 01/25/2020 09/12/2019 03/16/2019 09/12/2018  Decreased Interest 0 0 0 1 0  Down, Depressed, Hopeless 0 0 0 0 0  PHQ - 2 Score 0 0 0 1 0  Some recent data might be hidden    Quality of Life:  Quality of Life - 03/26/20 0751      Quality of Life Scores   Health/Function Pre 19.6 %    Health/Function Post 19.54 %    Health/Function % Change -0.31 %     Socioeconomic Pre 23.25 %    Socioeconomic Post 22.43 %    Socioeconomic % Change  -3.53 %    Psych/Spiritual Pre 24.86 %    Psych/Spiritual Post 22.57 %    Psych/Spiritual % Change -9.21 %    Family Pre 22.8 %    Family Post 23.5 %    Family % Change 3.07 %    GLOBAL Pre 21.94 %    GLOBAL Post 21.45 %    GLOBAL % Change -2.23 %           Personal Goals: Goals established at orientation with interventions provided to work toward goal.  Personal Goals and Risk Factors at Admission - 01/25/20 1042      Core Components/Risk Factors/Patient Goals on Admission    Weight Management Yes;Weight Loss    Intervention Weight Management: Develop a combined nutrition and exercise program designed to reach desired caloric intake, while maintaining appropriate intake of nutrient and fiber, sodium and fats, and appropriate energy expenditure required for the weight goal.;Weight Management: Provide education and appropriate resources to help participant work on and attain dietary goals.;Weight Management/Obesity: Establish reasonable short term and long term weight goals.    Admit Weight 181 lb 3.8 oz (82.2 kg)    Expected Outcomes Short Term: Continue to assess and modify interventions until short term weight is achieved;Long Term: Adherence to nutrition and physical activity/exercise program aimed toward attainment of established weight goal;Weight Loss: Understanding of general recommendations for a balanced deficit meal plan, which promotes 1-2 lb weight loss per week and includes a negative energy balance of [phone] kcal/d;Understanding recommendations for meals to include 15-35% energy as protein, 25-35% energy from fat, 35-60% energy from carbohydrates, less than '200mg'$  of dietary cholesterol, 20-35 gm of total fiber daily;Understanding of distribution of calorie intake throughout the day with the consumption of 4-5 meals/snacks    Hypertension Yes    Intervention Provide education on lifestyle  modifcations including regular physical activity/exercise,  weight management, moderate sodium restriction and increased consumption of fresh fruit, vegetables, and low fat dairy, alcohol moderation, and smoking cessation.;Monitor prescription use compliance.    Expected Outcomes Short Term: Continued assessment and intervention until BP is < 140/42m HG in hypertensive participants. < 130/827mHG in hypertensive participants with diabetes, heart failure or chronic kidney disease.;Long Term: Maintenance of blood pressure at goal levels.    Lipids Yes    Intervention Provide education and support for participant on nutrition & aerobic/resistive exercise along with prescribed medications to achieve LDL '70mg'$ , HDL >'40mg'$ .    Expected Outcomes Short Term: Participant states understanding of desired cholesterol values and is compliant with medications prescribed. Participant is following exercise prescription and nutrition guidelines.;Long Term: Cholesterol controlled with medications as prescribed, with individualized exercise RX and with personalized nutrition plan. Value goals: LDL < '70mg'$ , HDL > 40 mg.            Personal Goals Discharge:  Goals and Risk Factor Review    Row Name 01/29/20 1211 02/27/20 1612 03/21/20 1606         Core Components/Risk Factors/Patient Goals Review   Personal Goals Review Weight Management/Obesity;Hypertension;Lipids;Stress Weight Management/Obesity;Hypertension;Lipids;Stress Weight Management/Obesity;Hypertension;Lipids;Stress     Review Mr NeGrewetarted exercise on 01/29/20 and exercised without diffculty at a light level Mr NeGluthas been doing well with exercise for his fitlness level. Vital signs have been stable. Mr NeSabicet level has increased slowly Mr NeRichertas been doing well with exercise for his fitlness level. Vital signs have been stable. Mr NeSausedoet level has increased slowly. Mr NePrettymanraduates from cardiac rehab on 03/21/20     Expected Outcomes  Patient will continue to participate in phase 2 cardiac rehab for exercise nutrtion and lifstyle modifications Patient will continue to participate in phase 2 cardiac rehab for exercise nutrtion and lifstyle modifications Patient will continue to participate in phase 2 cardiac rehab for exercise nutrtion and lifstyle modifications            Exercise Goals and Review:  Exercise Goals    Row Name 01/25/20 1034             Exercise Goals   Increase Physical Activity Yes       Intervention Provide advice, education, support and counseling about physical activity/exercise needs.;Develop an individualized exercise prescription for aerobic and resistive training based on initial evaluation findings, risk stratification, comorbidities and participant's personal goals.       Expected Outcomes Short Term: Attend rehab on a regular basis to increase amount of physical activity.;Long Term: Add in home exercise to make exercise part of routine and to increase amount of physical activity.;Long Term: Exercising regularly at least 3-5 days a week.       Increase Strength and Stamina Yes       Intervention Provide advice, education, support and counseling about physical activity/exercise needs.;Develop an individualized exercise prescription for aerobic and resistive training based on initial evaluation findings, risk stratification, comorbidities and participant's personal goals.       Expected Outcomes Short Term: Increase workloads from initial exercise prescription for resistance, speed, and METs.;Short Term: Perform resistance training exercises routinely during rehab and add in resistance training at home;Long Term: Improve cardiorespiratory fitness, muscular endurance and strength as measured by increased METs and functional capacity (6MWT)       Able to understand and use rate of perceived exertion (RPE) scale Yes       Intervention Provide education and explanation on how to  use RPE scale        Expected Outcomes Short Term: Able to use RPE daily in rehab to express subjective intensity level;Long Term:  Able to use RPE to guide intensity level when exercising independently       Able to understand and use Dyspnea scale Yes       Intervention Provide education and explanation on how to use Dyspnea scale       Expected Outcomes Short Term: Able to use Dyspnea scale daily in rehab to express subjective sense of shortness of breath during exertion;Long Term: Able to use Dyspnea scale to guide intensity level when exercising independently       Knowledge and understanding of Target Heart Rate Range (THRR) Yes       Intervention Provide education and explanation of THRR including how the numbers were predicted and where they are located for reference       Expected Outcomes Short Term: Able to state/look up THRR;Long Term: Able to use THRR to govern intensity when exercising independently;Short Term: Able to use daily as guideline for intensity in rehab       Able to check pulse independently Yes       Intervention Provide education and demonstration on how to check pulse in carotid and radial arteries.;Review the importance of being able to check your own pulse for safety during independent exercise       Expected Outcomes Short Term: Able to explain why pulse checking is important during independent exercise;Long Term: Able to check pulse independently and accurately       Understanding of Exercise Prescription Yes       Intervention Provide education, explanation, and written materials on patient's individual exercise prescription       Expected Outcomes Short Term: Able to explain program exercise prescription;Long Term: Able to explain home exercise prescription to exercise independently              Exercise Goals Re-Evaluation:  Exercise Goals Re-Evaluation    Row Name 01/29/20 1205 02/28/20 1624 03/26/20 0759         Exercise Goal Re-Evaluation   Exercise Goals Review Increase  Physical Activity;Able to understand and use rate of perceived exertion (RPE) scale Increase Physical Activity;Increase Strength and Stamina;Able to understand and use rate of perceived exertion (RPE) scale;Knowledge and understanding of Target Heart Rate Range (THRR);Able to check pulse independently;Understanding of Exercise Prescription Increase Physical Activity;Increase Strength and Stamina;Able to understand and use rate of perceived exertion (RPE) scale;Knowledge and understanding of Target Heart Rate Range (THRR);Able to check pulse independently;Understanding of Exercise Prescription     Comments Patient able to understand and use RPE scale appropriately. Reviewed Home Exercise Plan with pt. Also discussed endpoints of exercises, NTG use, THRR, RPE Scale, weather conditions, warmup and cool down. Pt completed 22 sessions of cardiac rehab. Despite home stressors, his wife's health, pt consistently attended cardiac rehab and put forth good effort. Pt increased his functional capacity by 4%.     Expected Outcomes Increase workloads as tolerated to help improve cardiorespiratory fitness. Pt will continue to walk 4-5 days of the week for 20-30 minutes at home. Pt will continue to exercise by walking at home and 2 days a week in maintenance program.            Nutrition & Weight - Outcomes:  Pre Biometrics - 01/25/20 1035      Pre Biometrics   Height '5\' 8"'$  (1.727 m)    Weight 82.1 kg  Waist Circumference 40.5 inches    Hip Circumference 43 inches    Waist to Hip Ratio 0.94 %    BMI (Calculated) 27.53    Triceps Skinfold 15 mm    % Body Fat 28.4 %    Grip Strength 24.5 kg    Flexibility 0 in    Single Leg Stand 1.04 seconds   High risk for falls          Post Biometrics - 03/13/20 1125       Post  Biometrics   Waist Circumference 38.75 inches    Hip Circumference 42 inches    Waist to Hip Ratio 0.92 %    Triceps Skinfold 15 mm    Grip Strength 26.5 kg    Flexibility 0 in     Single Leg Stand 1.12 seconds   High risk for falls          Nutrition:  Nutrition Therapy & Goals - 02/14/20 1447      Nutrition Therapy   Diet Heart Healthy      Personal Nutrition Goals   Nutrition Goal Pt to build a healthy plate including vegetables, fruits, whole grains, and low-fat dairy products in a heart healthy meal plan.      Intervention Plan   Intervention Prescribe, educate and counsel regarding individualized specific dietary modifications aiming towards targeted core components such as weight, hypertension, lipid management, diabetes, heart failure and other comorbidities.    Expected Outcomes Short Term Goal: Understand basic principles of dietary content, such as calories, fat, sodium, cholesterol and nutrients.           Nutrition Discharge:  Nutrition Assessments - 04/04/20 0730      MEDFICTS Scores   Post Score 47           Education Questionnaire Score:  Knowledge Questionnaire Score - 03/22/20 1058      Knowledge Questionnaire Score   Pre Score 21/24    Post Score 20/24           Goals reviewed with patient; copy given to patient.Pt graduated from cardiac rehab program on 03/22/20 with completion of 22 exercise sessions in Phase II. Pt maintained good attendance and progressed nicely during his participation in rehab as evidenced by increased MET level.   Medication list reconciled. Repeat  PHQ score- 0  Pt has made significant lifestyle changes and should be commended for his success. Pt feels he has achieved his goals during cardiac rehab. Mr Mamaril increased his distance on his post exercise walk test by 39 feet. Mr Virnig says he feels stronger since he has been participating in the program. Mr Turko is  exercising in cardiac maintenance program. We are proud of Mr Addo's progress.Barnet Pall, RN,BSN 04/09/2020 8:25 AM

## 2020-03-25 ENCOUNTER — Telehealth (HOSPITAL_COMMUNITY): Payer: Self-pay | Admitting: *Deleted

## 2020-03-25 NOTE — Telephone Encounter (Signed)
Received referral for the cardiac rehab maintenance program. Contacted patient for scheduling. No answer, message left on patient's voicemail.  Sol Passer, MS, ACSM CEP 03/25/20 1153

## 2020-03-28 ENCOUNTER — Other Ambulatory Visit: Payer: Self-pay | Admitting: Cardiology

## 2020-04-02 ENCOUNTER — Encounter (HOSPITAL_COMMUNITY)
Admission: RE | Admit: 2020-04-02 | Discharge: 2020-04-02 | Disposition: A | Discharge status: Home / Self Care | Payer: Self-pay | Source: Ambulatory Visit | Attending: Cardiology | Admitting: Cardiology

## 2020-04-02 ENCOUNTER — Other Ambulatory Visit: Payer: Self-pay

## 2020-04-02 DIAGNOSIS — Z952 Presence of prosthetic heart valve: Secondary | ICD-10-CM | POA: Insufficient documentation

## 2020-04-04 ENCOUNTER — Encounter (HOSPITAL_COMMUNITY): Payer: Self-pay

## 2020-04-07 ENCOUNTER — Emergency Department (HOSPITAL_COMMUNITY)
Admission: EM | Admit: 2020-04-07 | Discharge: 2020-04-07 | Disposition: A | Discharge status: Home / Self Care | Payer: Medicare Other | Attending: Emergency Medicine | Admitting: Emergency Medicine

## 2020-04-07 ENCOUNTER — Other Ambulatory Visit: Payer: Self-pay

## 2020-04-07 ENCOUNTER — Emergency Department (HOSPITAL_COMMUNITY): Payer: Medicare Other

## 2020-04-07 DIAGNOSIS — I130 Hypertensive heart and chronic kidney disease with heart failure and stage 1 through stage 4 chronic kidney disease, or unspecified chronic kidney disease: Secondary | ICD-10-CM | POA: Diagnosis not present

## 2020-04-07 DIAGNOSIS — Y9289 Other specified places as the place of occurrence of the external cause: Secondary | ICD-10-CM | POA: Insufficient documentation

## 2020-04-07 DIAGNOSIS — W19XXXA Unspecified fall, initial encounter: Secondary | ICD-10-CM | POA: Diagnosis not present

## 2020-04-07 DIAGNOSIS — R55 Syncope and collapse: Secondary | ICD-10-CM | POA: Diagnosis not present

## 2020-04-07 DIAGNOSIS — Z952 Presence of prosthetic heart valve: Secondary | ICD-10-CM | POA: Insufficient documentation

## 2020-04-07 DIAGNOSIS — Y999 Unspecified external cause status: Secondary | ICD-10-CM | POA: Insufficient documentation

## 2020-04-07 DIAGNOSIS — R4020 Unspecified coma: Secondary | ICD-10-CM | POA: Diagnosis not present

## 2020-04-07 DIAGNOSIS — I2510 Atherosclerotic heart disease of native coronary artery without angina pectoris: Secondary | ICD-10-CM | POA: Diagnosis not present

## 2020-04-07 DIAGNOSIS — W08XXXA Fall from other furniture, initial encounter: Secondary | ICD-10-CM | POA: Diagnosis not present

## 2020-04-07 DIAGNOSIS — Y9389 Activity, other specified: Secondary | ICD-10-CM | POA: Diagnosis not present

## 2020-04-07 DIAGNOSIS — Z87891 Personal history of nicotine dependence: Secondary | ICD-10-CM | POA: Diagnosis not present

## 2020-04-07 DIAGNOSIS — S0101XA Laceration without foreign body of scalp, initial encounter: Secondary | ICD-10-CM | POA: Insufficient documentation

## 2020-04-07 DIAGNOSIS — N184 Chronic kidney disease, stage 4 (severe): Secondary | ICD-10-CM | POA: Insufficient documentation

## 2020-04-07 DIAGNOSIS — S0003XA Contusion of scalp, initial encounter: Secondary | ICD-10-CM | POA: Diagnosis not present

## 2020-04-07 DIAGNOSIS — Z79899 Other long term (current) drug therapy: Secondary | ICD-10-CM | POA: Diagnosis not present

## 2020-04-07 DIAGNOSIS — I5033 Acute on chronic diastolic (congestive) heart failure: Secondary | ICD-10-CM | POA: Insufficient documentation

## 2020-04-07 DIAGNOSIS — S0990XA Unspecified injury of head, initial encounter: Secondary | ICD-10-CM | POA: Diagnosis present

## 2020-04-07 LAB — BASIC METABOLIC PANEL
Anion gap: 10 (ref 5–15)
BUN: 52 mg/dL — ABNORMAL HIGH (ref 8–23)
CO2: 18 mmol/L — ABNORMAL LOW (ref 22–32)
Calcium: 8.5 mg/dL — ABNORMAL LOW (ref 8.9–10.3)
Chloride: 112 mmol/L — ABNORMAL HIGH (ref 98–111)
Creatinine, Ser: 2.45 mg/dL — ABNORMAL HIGH (ref 0.61–1.24)
GFR calc Af Amer: 26 mL/min — ABNORMAL LOW (ref >60)
GFR calc non Af Amer: 23 mL/min — ABNORMAL LOW (ref >60)
Glucose, Bld: 107 mg/dL — ABNORMAL HIGH (ref 70–99)
Potassium: 4.9 mmol/L (ref 3.5–5.1)
Sodium: 140 mmol/L (ref 135–145)

## 2020-04-07 LAB — CBC WITH DIFFERENTIAL/PLATELET
Abs Immature Granulocytes: 0.04 10*3/uL (ref 0.00–0.07)
Basophils Absolute: 0.1 10*3/uL (ref 0.0–0.1)
Basophils Relative: 0 %
Eosinophils Absolute: 0.3 10*3/uL (ref 0.0–0.5)
Eosinophils Relative: 2 %
HCT: 31 % — ABNORMAL LOW (ref 39.0–52.0)
Hemoglobin: 9.6 g/dL — ABNORMAL LOW (ref 13.0–17.0)
Immature Granulocytes: 0 %
Lymphocytes Relative: 8 %
Lymphs Abs: 0.9 10*3/uL (ref 0.7–4.0)
MCH: 32.3 pg (ref 26.0–34.0)
MCHC: 31 g/dL (ref 30.0–36.0)
MCV: 104.4 fL — ABNORMAL HIGH (ref 80.0–100.0)
Monocytes Absolute: 1 10*3/uL (ref 0.1–1.0)
Monocytes Relative: 9 %
Neutro Abs: 9.4 10*3/uL — ABNORMAL HIGH (ref 1.7–7.7)
Neutrophils Relative %: 81 %
Platelets: 121 10*3/uL — ABNORMAL LOW (ref 150–400)
RBC: 2.97 MIL/uL — ABNORMAL LOW (ref 4.22–5.81)
RDW: 13.2 % (ref 11.5–15.5)
WBC: 11.7 10*3/uL — ABNORMAL HIGH (ref 4.0–10.5)
nRBC: 0 % (ref 0.0–0.2)

## 2020-04-07 MED ORDER — SODIUM CHLORIDE 0.9 % IV BOLUS
1000.0000 mL | Freq: Once | INTRAVENOUS | Status: AC
  Administered 2020-04-07: 1000 mL via INTRAVENOUS
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
## 2020-04-07 NOTE — ED Notes (Signed)
Pt transported to CT ?

## 2020-04-07 NOTE — ED Notes (Signed)
Lab tech call d/c CBC with diff due to clotting. RN placed standing order for  redraw

## 2020-04-07 NOTE — ED Notes (Signed)
Discharge instructions discussed with pt and daughter at bedside. Pt verbalized understanding with no questions at this time. Pt ambulatory at discharge.

## 2020-04-07 NOTE — ED Triage Notes (Signed)
Pt BIB [facility] from outside church event after sustaining a fall from a stool. [facility] reports pt feel forward and +LOC for about 1 min. No obvious injury to front of face presents with skin tear to right arm and hematoma back of head. A&Ox4, denies pain, neuro intact. Takes plavix and eliquis

## 2020-04-07 NOTE — Discharge Instructions (Addendum)
If you develop new or worsening headache, vomiting, vision changes, palpitations, chest pain, shortness of breath, or feeling like you are going to pass out, or any other new/concerning symptoms to return to the ER for evaluation.

## 2020-04-07 NOTE — ED Notes (Signed)
Rn called requesting update r/t delay CT Head, per CT tech pt is next for scan

## 2020-04-09 ENCOUNTER — Other Ambulatory Visit: Payer: Self-pay

## 2020-04-09 ENCOUNTER — Encounter (HOSPITAL_COMMUNITY)
Admission: RE | Admit: 2020-04-09 | Discharge: 2020-04-09 | Disposition: A | Discharge status: Home / Self Care | Payer: Self-pay | Source: Ambulatory Visit | Attending: Cardiology | Admitting: Cardiology

## 2020-04-10 ENCOUNTER — Ambulatory Visit (INDEPENDENT_AMBULATORY_CARE_PROVIDER_SITE_OTHER): Payer: Medicare Other | Admitting: *Deleted

## 2020-04-10 DIAGNOSIS — E538 Deficiency of other specified B group vitamins: Secondary | ICD-10-CM

## 2020-04-10 MED ORDER — CYANOCOBALAMIN 1000 MCG/ML IJ SOLN
1000.0000 ug | Freq: Once | INTRAMUSCULAR | Status: AC
  Administered 2020-04-10: 1000 ug via INTRAMUSCULAR

## 2020-04-10 NOTE — Progress Notes (Signed)
Patient presents for B12 injection today. Patient received her B12 injection in Left  Deltoid. Patient tolerated injection well.  Documentation entered in MAR in [employer].   

## 2020-04-11 ENCOUNTER — Other Ambulatory Visit: Payer: Self-pay

## 2020-04-11 ENCOUNTER — Encounter (HOSPITAL_COMMUNITY)
Admission: RE | Admit: 2020-04-11 | Discharge: 2020-04-11 | Disposition: A | Discharge status: Home / Self Care | Payer: Self-pay | Source: Ambulatory Visit | Attending: Cardiology | Admitting: Cardiology

## 2020-04-11 DIAGNOSIS — Z23 Encounter for immunization: Secondary | ICD-10-CM | POA: Diagnosis not present

## 2020-04-11 DIAGNOSIS — Z952 Presence of prosthetic heart valve: Secondary | ICD-10-CM | POA: Insufficient documentation

## 2020-04-16 ENCOUNTER — Other Ambulatory Visit: Payer: Self-pay

## 2020-04-16 ENCOUNTER — Encounter (HOSPITAL_COMMUNITY)
Admission: RE | Admit: 2020-04-16 | Discharge: 2020-04-16 | Disposition: A | Discharge status: Home / Self Care | Payer: Self-pay | Source: Ambulatory Visit | Attending: Cardiology | Admitting: Cardiology

## 2020-04-17 DIAGNOSIS — R350 Frequency of micturition: Secondary | ICD-10-CM | POA: Diagnosis not present

## 2020-04-17 DIAGNOSIS — Z8546 Personal history of malignant neoplasm of prostate: Secondary | ICD-10-CM | POA: Diagnosis not present

## 2020-04-18 ENCOUNTER — Encounter (HOSPITAL_COMMUNITY)
Admission: RE | Admit: 2020-04-18 | Discharge: 2020-04-18 | Disposition: A | Discharge status: Home / Self Care | Payer: Self-pay | Source: Ambulatory Visit | Attending: Cardiology | Admitting: Cardiology

## 2020-04-18 ENCOUNTER — Other Ambulatory Visit: Payer: Self-pay

## 2020-04-23 ENCOUNTER — Encounter (HOSPITAL_COMMUNITY)
Admission: RE | Admit: 2020-04-23 | Discharge: 2020-04-23 | Disposition: A | Discharge status: Home / Self Care | Payer: Self-pay | Source: Ambulatory Visit | Attending: Cardiology | Admitting: Cardiology

## 2020-04-23 ENCOUNTER — Other Ambulatory Visit: Payer: Self-pay

## 2020-04-24 ENCOUNTER — Other Ambulatory Visit (HOSPITAL_COMMUNITY): Payer: Self-pay | Admitting: Family Medicine

## 2020-04-24 DIAGNOSIS — G43009 Migraine without aura, not intractable, without status migrainosus: Secondary | ICD-10-CM

## 2020-04-25 ENCOUNTER — Encounter (HOSPITAL_COMMUNITY)
Admission: RE | Admit: 2020-04-25 | Discharge: 2020-04-25 | Disposition: A | Discharge status: Home / Self Care | Payer: Self-pay | Source: Ambulatory Visit | Attending: Cardiology | Admitting: Cardiology

## 2020-04-25 ENCOUNTER — Other Ambulatory Visit: Payer: Self-pay

## 2020-04-30 ENCOUNTER — Other Ambulatory Visit: Payer: Self-pay

## 2020-04-30 ENCOUNTER — Encounter (HOSPITAL_COMMUNITY)
Admission: RE | Admit: 2020-04-30 | Discharge: 2020-04-30 | Disposition: A | Discharge status: Home / Self Care | Payer: Self-pay | Source: Ambulatory Visit | Attending: Cardiology | Admitting: Cardiology

## 2020-05-02 ENCOUNTER — Other Ambulatory Visit: Payer: Self-pay

## 2020-05-02 ENCOUNTER — Encounter (HOSPITAL_COMMUNITY)
Admission: RE | Admit: 2020-05-02 | Discharge: 2020-05-02 | Disposition: A | Discharge status: Home / Self Care | Payer: Self-pay | Source: Ambulatory Visit | Attending: Cardiology | Admitting: Cardiology

## 2020-05-07 ENCOUNTER — Encounter (HOSPITAL_COMMUNITY)
Admission: RE | Admit: 2020-05-07 | Discharge: 2020-05-07 | Disposition: A | Discharge status: Home / Self Care | Payer: Self-pay | Source: Ambulatory Visit | Attending: Cardiology | Admitting: Cardiology

## 2020-05-07 ENCOUNTER — Other Ambulatory Visit: Payer: Self-pay

## 2020-05-08 ENCOUNTER — Ambulatory Visit: Payer: Medicare Other

## 2020-05-09 ENCOUNTER — Encounter (HOSPITAL_COMMUNITY)
Admission: RE | Admit: 2020-05-09 | Discharge: 2020-05-09 | Disposition: A | Discharge status: Home / Self Care | Payer: Self-pay | Source: Ambulatory Visit | Attending: Cardiology | Admitting: Cardiology

## 2020-05-09 ENCOUNTER — Other Ambulatory Visit: Payer: Self-pay

## 2020-05-14 ENCOUNTER — Encounter (HOSPITAL_COMMUNITY)
Admission: RE | Admit: 2020-05-14 | Discharge: 2020-05-14 | Disposition: A | Discharge status: Home / Self Care | Payer: Self-pay | Source: Ambulatory Visit | Attending: Cardiology | Admitting: Cardiology

## 2020-05-14 ENCOUNTER — Other Ambulatory Visit: Payer: Self-pay

## 2020-05-14 DIAGNOSIS — Z952 Presence of prosthetic heart valve: Secondary | ICD-10-CM | POA: Insufficient documentation

## 2020-05-15 ENCOUNTER — Other Ambulatory Visit: Payer: Self-pay | Admitting: Cardiology

## 2020-05-16 ENCOUNTER — Ambulatory Visit (INDEPENDENT_AMBULATORY_CARE_PROVIDER_SITE_OTHER): Payer: Medicare Other | Admitting: *Deleted

## 2020-05-16 ENCOUNTER — Encounter: Payer: Self-pay | Admitting: Family Medicine

## 2020-05-16 ENCOUNTER — Other Ambulatory Visit: Payer: Self-pay | Admitting: Cardiology

## 2020-05-16 ENCOUNTER — Other Ambulatory Visit: Payer: Self-pay

## 2020-05-16 ENCOUNTER — Encounter (HOSPITAL_COMMUNITY)
Admission: RE | Admit: 2020-05-16 | Discharge: 2020-05-16 | Disposition: A | Discharge status: Home / Self Care | Payer: Self-pay | Source: Ambulatory Visit | Attending: Cardiology | Admitting: Cardiology

## 2020-05-16 DIAGNOSIS — Z23 Encounter for immunization: Secondary | ICD-10-CM

## 2020-05-16 DIAGNOSIS — E538 Deficiency of other specified B group vitamins: Secondary | ICD-10-CM

## 2020-05-16 MED ORDER — CYANOCOBALAMIN 1000 MCG/ML IJ SOLN
1000.0000 ug | Freq: Once | INTRAMUSCULAR | Status: AC
  Administered 2020-05-16: 1000 ug via INTRAMUSCULAR

## 2020-05-16 NOTE — Progress Notes (Signed)
Per orders of Dr. [name], injection of Cyanocobalamin 1000 mcg given by Anselmo Pickler, LPN in right deltoid. Patient tolerated injection well. Patient will make appointment for 1 month

## 2020-05-17 NOTE — Telephone Encounter (Signed)
Rx has been sent to the pharmacy electronically. ° °

## 2020-05-20 ENCOUNTER — Other Ambulatory Visit (HOSPITAL_COMMUNITY): Payer: Self-pay | Admitting: Family Medicine

## 2020-05-21 ENCOUNTER — Encounter (HOSPITAL_COMMUNITY): Payer: Self-pay

## 2020-05-23 ENCOUNTER — Other Ambulatory Visit: Payer: Self-pay

## 2020-05-23 ENCOUNTER — Encounter (HOSPITAL_COMMUNITY)
Admission: RE | Admit: 2020-05-23 | Discharge: 2020-05-23 | Disposition: A | Discharge status: Home / Self Care | Payer: Self-pay | Source: Ambulatory Visit | Attending: Cardiology | Admitting: Cardiology

## 2020-05-23 DIAGNOSIS — D631 Anemia in chronic kidney disease: Secondary | ICD-10-CM | POA: Diagnosis not present

## 2020-05-23 DIAGNOSIS — Z6828 Body mass index (BMI) 28.0-28.9, adult: Secondary | ICD-10-CM | POA: Diagnosis not present

## 2020-05-23 DIAGNOSIS — N183 Chronic kidney disease, stage 3 unspecified: Secondary | ICD-10-CM | POA: Diagnosis not present

## 2020-05-28 ENCOUNTER — Encounter (HOSPITAL_COMMUNITY)
Admission: RE | Admit: 2020-05-28 | Discharge: 2020-05-28 | Disposition: A | Discharge status: Home / Self Care | Payer: Medicare Other | Source: Ambulatory Visit | Attending: Cardiology | Admitting: Cardiology

## 2020-05-28 ENCOUNTER — Other Ambulatory Visit: Payer: Self-pay

## 2020-05-28 DIAGNOSIS — H52203 Unspecified astigmatism, bilateral: Secondary | ICD-10-CM | POA: Diagnosis not present

## 2020-05-28 DIAGNOSIS — H353131 Nonexudative age-related macular degeneration, bilateral, early dry stage: Secondary | ICD-10-CM | POA: Diagnosis not present

## 2020-05-30 ENCOUNTER — Encounter (HOSPITAL_COMMUNITY): Payer: Self-pay

## 2020-06-12 ENCOUNTER — Other Ambulatory Visit: Payer: Self-pay | Admitting: Cardiology

## 2020-06-12 DIAGNOSIS — Z9861 Coronary angioplasty status: Secondary | ICD-10-CM

## 2020-06-12 DIAGNOSIS — I2510 Atherosclerotic heart disease of native coronary artery without angina pectoris: Secondary | ICD-10-CM

## 2020-06-12 DIAGNOSIS — Z7901 Long term (current) use of anticoagulants: Secondary | ICD-10-CM

## 2020-06-13 ENCOUNTER — Ambulatory Visit (INDEPENDENT_AMBULATORY_CARE_PROVIDER_SITE_OTHER): Payer: Medicare Other

## 2020-06-13 ENCOUNTER — Other Ambulatory Visit: Payer: Self-pay

## 2020-06-13 DIAGNOSIS — E538 Deficiency of other specified B group vitamins: Secondary | ICD-10-CM

## 2020-06-13 MED ORDER — CYANOCOBALAMIN 1000 MCG/ML IJ SOLN
1000.0000 ug | Freq: Once | INTRAMUSCULAR | Status: AC
  Administered 2020-06-13: 1000 ug via INTRAMUSCULAR

## 2020-06-13 NOTE — Progress Notes (Signed)
Mark Jackson. 86 y.o. male presents to office today for B12 injection per [name], MD. Administered CYANOCOBALAMIN 1,000 mcg/mL IM left arm. Patient tolerated well.

## 2020-06-17 ENCOUNTER — Encounter: Payer: Self-pay | Admitting: Cardiology

## 2020-06-17 ENCOUNTER — Ambulatory Visit (INDEPENDENT_AMBULATORY_CARE_PROVIDER_SITE_OTHER): Payer: Medicare Other | Admitting: Cardiology

## 2020-06-17 ENCOUNTER — Other Ambulatory Visit: Payer: Self-pay

## 2020-06-17 VITALS — BP 134/72 | HR 63 | Ht 68.0 in | Wt 180.8 lb

## 2020-06-17 DIAGNOSIS — I779 Disorder of arteries and arterioles, unspecified: Secondary | ICD-10-CM

## 2020-06-17 DIAGNOSIS — R600 Localized edema: Secondary | ICD-10-CM

## 2020-06-17 DIAGNOSIS — I6523 Occlusion and stenosis of bilateral carotid arteries: Secondary | ICD-10-CM

## 2020-06-17 DIAGNOSIS — I480 Paroxysmal atrial fibrillation: Secondary | ICD-10-CM | POA: Diagnosis not present

## 2020-06-17 DIAGNOSIS — Z7901 Long term (current) use of anticoagulants: Secondary | ICD-10-CM

## 2020-06-17 DIAGNOSIS — I5032 Chronic diastolic (congestive) heart failure: Secondary | ICD-10-CM

## 2020-06-17 DIAGNOSIS — I25119 Atherosclerotic heart disease of native coronary artery with unspecified angina pectoris: Secondary | ICD-10-CM | POA: Diagnosis not present

## 2020-06-17 DIAGNOSIS — Z952 Presence of prosthetic heart valve: Secondary | ICD-10-CM

## 2020-06-17 DIAGNOSIS — E785 Hyperlipidemia, unspecified: Secondary | ICD-10-CM | POA: Diagnosis not present

## 2020-06-17 DIAGNOSIS — R55 Syncope and collapse: Secondary | ICD-10-CM | POA: Diagnosis not present

## 2020-06-17 DIAGNOSIS — I10 Essential (primary) hypertension: Secondary | ICD-10-CM | POA: Diagnosis not present

## 2020-06-17 DIAGNOSIS — N184 Chronic kidney disease, stage 4 (severe): Secondary | ICD-10-CM | POA: Diagnosis not present

## 2020-06-17 NOTE — Progress Notes (Addendum)
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 Cardiologist (physician) and Advanced Practice Providers (APPs -  Physician Assistants and Nurse Practitioners) who all work together to provide you with the care you need, when you need it.     Your next appointment:   6 month(s)  The format for your next appointment:   In Person  Provider:   [name], MD       Studies Ordered:   Orders Placed This Encounter  Procedures  . EKG 12-Lead     [name], M.D., M.S. Interventional Cardiologist   Pager # [phone] Phone # [phone] [address] [place], [place] [address]   Thank you for choosing Heartcare at [facility]!!

## 2020-06-17 NOTE — Patient Instructions (Addendum)
Medication Instructions:    you can stop taking Clopidogrel 75 mg   On Jul 09, 2020  ( last day )    *If you need a refill on your cardiac medications before your next appointment, please call your pharmacy*   Lab Work:  Not needed   Testing/Procedures: Not needed   Follow-Up: At [facility], you and your health needs are our priority.  As part of our continuing mission to provide you with exceptional heart care, we have created designated Provider Care Teams.  These Care Teams include your primary Cardiologist (physician) and Advanced Practice Providers (APPs -  Physician Assistants and Nurse Practitioners) who all work together to provide you with the care you need, when you need it.     Your next appointment:   6 month(s)  The format for your next appointment:   In Person  Provider:   [name], MD

## 2020-06-24 ENCOUNTER — Ambulatory Visit: Payer: Medicare Other | Admitting: Cardiology

## 2020-06-27 ENCOUNTER — Encounter: Payer: Self-pay | Admitting: Cardiology

## 2020-06-27 NOTE — Assessment & Plan Note (Signed)
Lipids look great as of April.  LDL well controlled.  Continue current dose of rosuvastatin -> 20 mg daily.

## 2020-06-27 NOTE — Assessment & Plan Note (Signed)
Patent stents as of September 2020.  No further anginal symptoms.  He is having some epigastric symptoms which may be more GI related.  For combination of the discomfort and bruising, he asked about stopping Plavix.  I think is not unreasonable to stop 6 months post TAVR.    Plan:  Plan will be to discontinue Plavix at the end of this month and continue Eliquis at reduced dose based on renal function.  Continue diltiazem which we are using for antianginal benefit and rate control for A. fib.  (Less fatigue than beta-blocker)  He is on stable dose of Imdur and Crestor.

## 2020-06-27 NOTE — Assessment & Plan Note (Signed)
Creatinine stabilized back up to 2.45 which is CKD 3B-4.  Still making urine.  Remains on standing dose of 20 mg Lasix and tolerating well. Renal dosing of Eliquis. Avoiding nephrotoxins, and allowing mild permissive hypertension  Follow-up with nephrology.

## 2020-06-27 NOTE — Assessment & Plan Note (Signed)
Episode in August quite likely related to heat and dehydration.  Data importance of adequate hydration and eating.   Allow for mild permissive hypertension.

## 2020-06-27 NOTE — Assessment & Plan Note (Signed)
Followed by Dr. Trula Slade along with his abdominal ulcer.  Plan is continue cardiovascular splinting blood pressure and lipid control.

## 2020-06-27 NOTE — Assessment & Plan Note (Signed)
Prefer to use support stockings and foot elevation to excess dose of Lasix.  He takes a low-dose of Lasix and rarely use additional dose.  Not associated PND orthopnea.

## 2020-06-27 NOTE — Assessment & Plan Note (Signed)
Doing well on Eliquis.  No major bleeding with combination of Eliquis and Plavix but he is noticing nuisance bruising.  Also having some GI upset from Plavix.  Plan will be to continue Plavix throughout the remainder of this month (November 2021) then discontinue Plavix and continue with Eliquis alone.

## 2020-06-27 NOTE — Assessment & Plan Note (Addendum)
Most recent follow-up echo in June showed normal functioning valve with minimal gradient.  Interestingly that he had what was considered to be paradoxical low flow-low gradient aortic stenosis.  Now status post TAVR.  He finally seems to have regained his normal energy level.  I think a lot of this has to do with him getting better sleep now.  Plan was for Plavix x6 months post TAVR and then okay to discontinue at the end of November 2021.   Continue Eliquis.  Follow-up with Lattie Corns, PA in TAVR clinic as scheduled.  Anticipate echocardiogram next year  Has as needed amoxicillin written for TAVR SBE prophylaxis.

## 2020-06-27 NOTE — Assessment & Plan Note (Addendum)
He does seem to be pretty symptomatic when he is in A. fib.  No further breakthrough spells that I can tell. On diltiazem for rate control in lieu of a beta-blocker because of fatigue issues on beta-blocker. Stable on reduced dose Eliquis because of renal function (CKD4)  As long as he is taking his Eliquis, my recommendation would be to try to cardiovert him if he goes back in A. fib.

## 2020-06-27 NOTE — Assessment & Plan Note (Signed)
Not really an issue.  I am more than happy of him having some mild permissive hypertension.  He is only on diltiazem and Imdur.  No plans to treat more aggressively.  He has had orthostatic syncope as most recently as August.

## 2020-06-27 NOTE — Assessment & Plan Note (Signed)
Preserved LVEF with diastolic dysfunction.  Partly related to chronic aortic stenosis.  He does have edema but not associated PND orthopnea.  With some mild fatigue, would say NYHA class II CHF.  Continue hypertensive regimen and low-dose diuretic.

## 2020-07-11 ENCOUNTER — Ambulatory Visit (INDEPENDENT_AMBULATORY_CARE_PROVIDER_SITE_OTHER): Payer: Medicare Other

## 2020-07-11 DIAGNOSIS — E538 Deficiency of other specified B group vitamins: Secondary | ICD-10-CM

## 2020-07-11 MED ORDER — CYANOCOBALAMIN 1000 MCG/ML IJ SOLN
1000.0000 ug | Freq: Once | INTRAMUSCULAR | Status: AC
  Administered 2020-07-11: 1000 ug via INTRAMUSCULAR

## 2020-07-11 NOTE — Progress Notes (Signed)
Mark Jackson. 86 y.o. male presents to office today for monthly B12 injection per [name], MD. Administered CYANOCOBALAMIN 1,000 mcg IM right arm. Patient tolerate well.

## 2020-07-14 ENCOUNTER — Other Ambulatory Visit: Payer: Self-pay | Admitting: Cardiology

## 2020-07-14 DIAGNOSIS — Z7901 Long term (current) use of anticoagulants: Secondary | ICD-10-CM

## 2020-07-14 DIAGNOSIS — I2510 Atherosclerotic heart disease of native coronary artery without angina pectoris: Secondary | ICD-10-CM

## 2020-07-14 DIAGNOSIS — Z9861 Coronary angioplasty status: Secondary | ICD-10-CM

## 2020-07-15 NOTE — Progress Notes (Signed)
I have reviewed and agree with note, evaluation, plan.   Kwan Shellhammer, MD  

## 2020-07-15 NOTE — Telephone Encounter (Signed)
Prescription refill request for Eliquis received. Indication: Atrial Fibrillation Last office visit: 06/2020  Ellyn Hack Scr: 2.45  03/2020 Age: 86 Weight: 82 kg  Prescription refilled

## 2020-07-18 NOTE — Patient Instructions (Addendum)
Health Maintenance Due  Topic Date Due  . COVID-19 Vaccine (1) patient states that he has had his vaccine he can't remember when and doesn't have his card. Will look for his card when he gets home and call us back with dates- also recommended booster 6 months out from booster Never done   Please stop by lab before you go If you have mychart- we will send your results within 3 business days of [place] receiving them.  If you do not have mychart- we will call you about results within 5 business days of [place] receiving them.  *please note we are currently using [facility] which has a longer processing time than [place] typically so labs may not come back as quickly as in the past *please also note that you will see labs on mychart as soon as they post. I will later go in and write notes on them- will say "notes from Dr. [name]"  Schedule a visit with our physical therapist Janeann Forehand before you leave

## 2020-07-18 NOTE — Progress Notes (Signed)
Phone [phone] In person visit   Subjective:   Mark Jackson. is a 87 y.o. year old very pleasant male patient who presents for/with See problem oriented charting Chief Complaint  Patient presents with  . Fall    Frequent falls , Pt states that he's fallen three times this week. He tore his skin twice this week but hasn't hurt himself.    This visit occurred during the SARS-CoV-2 public health emergency.  Safety protocols were in place, including screening questions prior to the visit, additional usage of staff PPE, and extensive cleaning of exam room while observing appropriate contact time as indicated for disinfecting solutions.   Past Medical History-  Patient Active Problem List   Diagnosis Date Noted  . Chronic diastolic heart failure ([place]) 12/12/2019    Priority: High  . S/P TAVR (transcatheter aortic valve replacement) 12/12/2019    Priority: High  . CAD S/P DES PCI to proximal LAD 03/22/2015    Priority: High  . Paroxysmal atrial fibrillation ([place]); CHA2DSVasc - 4; Now on Eliquis 08/20/2014    Priority: High    Class: Diagnosis of  . Chronic kidney disease (CKD), active medical management without dialysis, stage 4 (severe) ([place]) 08/20/2014    Priority: High  . Personal history of prostate cancer 10/22/2008    Priority: High  . Hyperglycemia 09/27/2017    Priority: Medium  . B12 deficiency 01/06/2017    Priority: Medium  . BPH associated with nocturia 06/15/2016    Priority: Medium  . Hereditary and idiopathic peripheral neuropathy 01/12/2014    Priority: Medium  . H/O syncope 09/03/2013    Priority: Medium  . Right-sided carotid artery disease; followed by Dr. Trula Slade 03/02/2013    Priority: Medium  . Hyperlipidemia with target LDL less than 70 03/02/2013    Priority: Medium  . Migraine without aura 10/26/2012    Priority: Medium  . Anemia 10/23/2008    Priority: Medium  . Essential hypertension 10/22/2008    Priority: Medium  . Sleep apnea  10/22/2008    Priority: Medium  . Chronic diarrhea 01/06/2017    Priority: Low  . Perianal dermatitis 06/19/2015    Priority: Low  . Rectal bleeding 04/25/2015    Priority: Low  . Long term current use of anticoagulant therapy 08/27/2014    Priority: Low  . Obesity (BMI 30-39.9) 09/03/2013    Priority: Low  . GLAUCOMA 10/23/2008    Priority: Low  . Arthropathy 10/22/2008    Priority: Low  . History of colonic polyps 10/22/2008    Priority: Low  . Insomnia, psychophysiological 02/28/2020  . Severe aortic stenosis 12/12/2019  . Syncope and collapse 11/27/2019  . Bilateral lower extremity edema 04/15/2017    Medications- reviewed and updated Current Outpatient Medications  Medication Sig Dispense Refill  . acetaminophen (TYLENOL) 325 MG tablet Take 325-650 mg by mouth every 6 (six) hours as needed for moderate pain or headache.     Marland Kitchen amoxicillin (AMOXIL) 500 MG tablet Take 2,000 mg (4 capsules) 1 hour prior to all dental visits. 8 tablet 11  . diltiazem (CARDIZEM CD) 180 MG 24 hr capsule TAKE (1) CAPSULE DAILY. 90 capsule 0  . ELIQUIS 2.5 MG TABS tablet TAKE 1 TABLET BY MOUTH TWICE DAILY. 60 tablet 5  . ferrous sulfate 325 (65 FE) MG tablet Take 325 mg by mouth daily with breakfast.    . furosemide (LASIX) 40 MG tablet Take 0.5 tablets (20 mg total) by mouth daily.    . isosorbide  mononitrate (IMDUR) 60 MG 24 hr tablet TAKE 1 TABLET ONCE DAILY AFTER BREAKFAST. 90 tablet 3  . loperamide (IMODIUM A-D) 2 MG tablet Take 2 mg by mouth 4 (four) times daily as needed for diarrhea or loose stools.     . Multiple Vitamin (MULTIVITAMIN PO) Take 1 tablet by mouth daily.     . nitroGLYCERIN (NITROSTAT) 0.4 MG SL tablet ONE TABLET UNDER TONGUE WHEN NEEDED FOR CHEST PAIN. MAY REPEAT IN 5 MINUTES. 25 tablet 0  . rosuvastatin (CRESTOR) 20 MG tablet TAKE (1) TABLET DAILY AT BEDTIME. 90 tablet 0  . vitamin B-12 (CYANOCOBALAMIN) 1000 MCG tablet Take 1,000 mcg by mouth daily.     Marland Kitchen topiramate  (TOPAMAX) 200 MG tablet TAKE 1 TABLET ONCE DAILY. (Patient not taking: Reported on 07/19/2020) 90 tablet 0   No current facility-administered medications for this visit.     Objective:  BP 136/71   Pulse 88   Temp 98.3 F (36.8 C) (Temporal)   Ht 5\' 8"  (1.727 m)   Wt 184 lb 6.4 oz (83.6 kg)   SpO2 98%   BMI 28.04 kg/m  Gen: NAD, resting comfortably CV: RRR no murmurs rubs or gallops Lungs: CTAB no crackles, wheeze, rhonchi Abdomen: soft/nontender/nondistended/normal bowel sounds. Ext: trace edema Skin: warm, dry Neuro: slow gait - holds onto chair as he stands as caution.       Assessment and Plan   # Frequent Falls S: Patient 86 years old with multiple medical conditions including aortic valve stenosis with TAVR earlier this year,  CKD IV and anemia associated with this, CAD, a fib on eliquis, neuropathy, b12 deficiency, chronic diastolic CHF  Took garbage can out Monday night and when he was coming back to house he hit the curb which was hidden by leaves. Neighbor helped him up.  30 minutes earlier than that had lost balance and fallen against the car- did not fall down  Also tripped on bed post Thursday night- hit toe and went down. Only injuries have been skin tears. No headaches, lightheadedness/chest pain. No prescyncope. Does admit to feeling weak overall.   Admitted for syncope 11/27/19 with possible relation to aortic stenosis. CT head negative at that time. Possible relation to increase in lasix- discharged at 20 mg daily at that time  Sleeping 10 hours a day- stil working. Bought new step machine  Has seen [place] [facility] within 3 months  b12 injections every 4 weeks Lab Results  Component Value Date   VITAMINB12 >1500 (H) 03/16/2019    A/P: Patient with 3 falls since Monday. All of these seem to be related to mechanical/tripping issues but also has some balance issues. Refer to physical therapy. Also seems to feel somewhat fatigued/weak overall we will update  blood work as below. We will plan on 78-month follow-up but I told him if continues have issues I would like to see him back. Thankfully no major injury. Hoping CKD IV has not worsened- stable on last check  He does not have dizziness or palpitations concerning for arrhythmia. No increased edema suggesting CHF exacerbation. No chest pain in relation to known CAD and denies shortness of breath. He denies orthostatic element to his symptoms  #recommend voltaren gel for left MCP thumb, and right 2nd PIP joint- looks to have arthritis. Try 4x a day. Can use on knee later (a little more cuatious with CKD)   # Atrial fibrillation S: Rate controlled with diltiazem 180 mg extended release Anticoagulated with  Eliquis 2.5 mg  BID A/P: Appropriately anticoagulated and rate controlled. Continue current medication. I do want to make sure he is not significantly more anemic with Eliquis. He also takes iron. Some of anemia issues are likely related to chronic kidney disease though.  #Heart failure with preserved ejection fraction S: Medication: Lasix 20 mg  Edema: Stable/trace Weight gain: Weight is up a few pounds from August but appears to be in range of his baseline Shortness of breath: Denies  A/P: I do not think CHF is the cause of patient's weakness. Fluid status appears stable. Continue Lasix 20 mg daily  #Migraines-Topamax continues to be helpful-we also discussed potential relation to fall risk-she agrees to try half tablet  Recommended follow up: Return in about 6 months (around 01/17/2021) for follow up- or sooner if needed. Future Appointments  Date Time Provider [place]  08/15/2020  3:30 PM [facility] NURSE [facility]    Lab/Order associations:   ICD-10-CM   1. S/P TAVR (transcatheter aortic valve replacement)  Z95.2   2. Chronic kidney disease (CKD), active medical management without dialysis, stage 4 (severe) (H[facility])  N18.4   3. Chronic diastolic heart failure ([facility])  I50.32   4.  Paroxysmal atrial fibrillation ([place]); CHA2DSVasc - 4; Now on Eliquis  I48.0   5. CAD S/P DES PCI to proximal LAD  I25.119   6. Imbalance  R26.89 Ambulatory referral to Physical Therapy  7. Personal history of fall  Z91.81 Ambulatory referral to Physical Therapy  8. B12 deficiency  E53.8 Vitamin B12    Vitamin B12  9. Hyperglycemia  R73.9 Hemoglobin A1c    Hemoglobin A1c  10. Fatigue, unspecified type  R53.83 CBC With Differential/Platelet    COMPLETE METABOLIC PANEL WITH GFR    TSH    TSH    COMPLETE METABOLIC PANEL WITH GFR    CBC With Differential/Platelet   Return precautions advised.  [name], MD

## 2020-07-19 ENCOUNTER — Other Ambulatory Visit: Payer: Self-pay

## 2020-07-19 ENCOUNTER — Ambulatory Visit (INDEPENDENT_AMBULATORY_CARE_PROVIDER_SITE_OTHER): Payer: Medicare Other | Admitting: Family Medicine

## 2020-07-19 ENCOUNTER — Encounter: Payer: Self-pay | Admitting: Family Medicine

## 2020-07-19 VITALS — BP 136/71 | HR 88 | Temp 98.3°F | Ht 68.0 in | Wt 184.4 lb

## 2020-07-19 DIAGNOSIS — R2689 Other abnormalities of gait and mobility: Secondary | ICD-10-CM

## 2020-07-19 DIAGNOSIS — Z952 Presence of prosthetic heart valve: Secondary | ICD-10-CM | POA: Diagnosis not present

## 2020-07-19 DIAGNOSIS — Z9181 History of falling: Secondary | ICD-10-CM | POA: Diagnosis not present

## 2020-07-19 DIAGNOSIS — N184 Chronic kidney disease, stage 4 (severe): Secondary | ICD-10-CM

## 2020-07-19 DIAGNOSIS — I25119 Atherosclerotic heart disease of native coronary artery with unspecified angina pectoris: Secondary | ICD-10-CM

## 2020-07-19 DIAGNOSIS — I480 Paroxysmal atrial fibrillation: Secondary | ICD-10-CM | POA: Diagnosis not present

## 2020-07-19 DIAGNOSIS — I6523 Occlusion and stenosis of bilateral carotid arteries: Secondary | ICD-10-CM | POA: Diagnosis not present

## 2020-07-19 DIAGNOSIS — R739 Hyperglycemia, unspecified: Secondary | ICD-10-CM

## 2020-07-19 DIAGNOSIS — E538 Deficiency of other specified B group vitamins: Secondary | ICD-10-CM

## 2020-07-19 DIAGNOSIS — R5383 Other fatigue: Secondary | ICD-10-CM | POA: Diagnosis not present

## 2020-07-19 DIAGNOSIS — I5032 Chronic diastolic (congestive) heart failure: Secondary | ICD-10-CM

## 2020-07-20 LAB — CBC WITH DIFFERENTIAL/PLATELET
Absolute Monocytes: 977 cells/uL — ABNORMAL HIGH (ref 200–950)
Basophils Absolute: 56 cells/uL (ref 0–200)
Basophils Relative: 0.6 %
Eosinophils Absolute: 437 cells/uL (ref 15–500)
Eosinophils Relative: 4.7 %
HCT: 32 % — ABNORMAL LOW (ref 38.5–50.0)
Hemoglobin: 10.6 g/dL — ABNORMAL LOW (ref 13.2–17.1)
Lymphs Abs: 1079 cells/uL (ref 850–3900)
MCH: 33 pg (ref 27.0–33.0)
MCHC: 33.1 g/dL (ref 32.0–36.0)
MCV: 99.7 fL (ref 80.0–100.0)
MPV: 10.9 fL (ref 7.5–12.5)
Monocytes Relative: 10.5 %
Neutro Abs: 6752 cells/uL (ref 1500–7800)
Neutrophils Relative %: 72.6 %
Platelets: 137 10*3/uL — ABNORMAL LOW (ref 140–400)
RBC: 3.21 10*6/uL — ABNORMAL LOW (ref 4.20–5.80)
RDW: 12 % (ref 11.0–15.0)
Total Lymphocyte: 11.6 %
WBC: 9.3 10*3/uL (ref 3.8–10.8)

## 2020-07-20 LAB — COMPLETE METABOLIC PANEL WITH GFR
AG Ratio: 1.4 (calc) (ref 1.0–2.5)
ALT: 9 U/L (ref 9–46)
AST: 18 U/L (ref 10–35)
Albumin: 3.5 g/dL — ABNORMAL LOW (ref 3.6–5.1)
Alkaline phosphatase (APISO): 56 U/L (ref 35–144)
BUN/Creatinine Ratio: 19 (calc) (ref 6–22)
BUN: 56 mg/dL — ABNORMAL HIGH (ref 7–25)
CO2: 22 mmol/L (ref 20–32)
Calcium: 8.3 mg/dL — ABNORMAL LOW (ref 8.6–10.3)
Chloride: 111 mmol/L — ABNORMAL HIGH (ref 98–110)
Creat: 2.97 mg/dL — ABNORMAL HIGH (ref 0.70–1.11)
GFR, Est African American: 21 mL/min/{1.73_m2} — ABNORMAL LOW (ref >=60)
GFR, Est Non African American: 18 mL/min/{1.73_m2} — ABNORMAL LOW (ref >=60)
Globulin: 2.5 g/dL (calc) (ref 1.9–3.7)
Glucose, Bld: 115 mg/dL — ABNORMAL HIGH (ref 65–99)
Potassium: 4.9 mmol/L (ref 3.5–5.3)
Sodium: 141 mmol/L (ref 135–146)
Total Bilirubin: 0.4 mg/dL (ref 0.2–1.2)
Total Protein: 6 g/dL — ABNORMAL LOW (ref 6.1–8.1)

## 2020-07-20 LAB — HEMOGLOBIN A1C
Hgb A1c MFr Bld: 5.6 % of total Hgb (ref <5.7)
Mean Plasma Glucose: 114 mg/dL
eAG (mmol/L): 6.3 mmol/L

## 2020-07-20 LAB — TSH: TSH: 2.98 mIU/L (ref 0.40–4.50)

## 2020-07-20 LAB — VITAMIN B12: Vitamin B-12: 963 pg/mL (ref 200–1100)

## 2020-07-24 ENCOUNTER — Telehealth: Payer: Self-pay | Admitting: Family Medicine

## 2020-07-24 NOTE — Chronic Care Management (AMB) (Signed)
  Chronic Care Management   Note  07/24/2020 Name: Mark Jackson. MRN: [id number] DOB: November 03, 1929  Mark Jackson. is a 86 y.o. year old male who is a primary care patient of [name], MD. I reached out to Mark Jackson. by phone today in response to a referral sent by [name]'s PCP, [name], MD.   Mark Jackson was given information about Chronic Care Management services today including:  1. CCM service includes personalized support from designated clinical staff supervised by his physician, including individualized plan of care and coordination with other care providers 2. 24/7 contact phone numbers for assistance for urgent and routine care needs. 3. Service will only be billed when office clinical staff spend 20 minutes or more in a month to coordinate care. 4. Only one practitioner may furnish and bill the service in a calendar month. 5. The patient may stop CCM services at any time (effective at the end of the month) by phone call to the office staff.   Patient agreed to services and verbal consent obtained.   Follow up plan:   Mark Jackson Upstream Scheduler

## 2020-07-29 ENCOUNTER — Ambulatory Visit (INDEPENDENT_AMBULATORY_CARE_PROVIDER_SITE_OTHER): Payer: Medicare Other | Admitting: Physical Therapy

## 2020-07-29 ENCOUNTER — Other Ambulatory Visit: Payer: Self-pay

## 2020-07-29 DIAGNOSIS — R2689 Other abnormalities of gait and mobility: Secondary | ICD-10-CM

## 2020-07-30 ENCOUNTER — Encounter: Payer: Self-pay | Admitting: Physical Therapy
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
## 2020-08-08 ENCOUNTER — Ambulatory Visit (INDEPENDENT_AMBULATORY_CARE_PROVIDER_SITE_OTHER): Payer: Medicare Other | Admitting: Physical Therapy

## 2020-08-08 ENCOUNTER — Other Ambulatory Visit: Payer: Self-pay

## 2020-08-08 DIAGNOSIS — R2689 Other abnormalities of gait and mobility: Secondary | ICD-10-CM | POA: Diagnosis not present

## 2020-08-08 NOTE — Patient Instructions (Signed)
Access Code: [email or web address] URL: [email or web address] Date: 08/08/2020 Prepared by: Lyndee Hensen  Exercises Seated Knee Extension AROM - 1 x daily - 2 sets - 10 reps Standing March with Counter Support - 1 x daily - 2 sets - 10 reps Sit to Stand - 1 x daily - 1 sets - 10 reps

## 2020-08-09 ENCOUNTER — Other Ambulatory Visit: Payer: Self-pay | Admitting: Family Medicine

## 2020-08-09 DIAGNOSIS — G43009 Migraine without aura, not intractable, without status migrainosus: Secondary | ICD-10-CM

## 2020-08-12 ENCOUNTER — Encounter: Payer: Self-pay | Admitting: Physical Therapy
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
## 2020-08-12 NOTE — Progress Notes (Signed)
Chronic Care Management Pharmacy Name: [facility].     MRN: [id number]     DOB: [birth year]  Chief Complaint/ HPI Mark Jackson., 87 y.o., male, presents for their initial CCM visit with the clinical pharmacist in office.  PCP: [name], MD Encounter Diagnoses  Name Primary?  . Paroxysmal atrial fibrillation ([place]); CHA2DSVasc - 4; Now on Eliquis   . Hyperlipidemia with target LDL less than 70   . Essential hypertension     Patient Active Problem List   Diagnosis Date Noted  . Insomnia, psychophysiological 02/28/2020  . Severe aortic stenosis 12/12/2019  . Chronic diastolic heart failure ([place]) 12/12/2019  . S/P TAVR (transcatheter aortic valve replacement) 12/12/2019  . Syncope and collapse 11/27/2019  . Hyperglycemia 09/27/2017  . Bilateral lower extremity edema 04/15/2017  . B12 deficiency 01/06/2017  . Chronic diarrhea 01/06/2017  . BPH associated with nocturia 06/15/2016  . Perianal dermatitis 06/19/2015  . Rectal bleeding 04/25/2015  . CAD S/P DES PCI to proximal LAD 03/22/2015  . Long term current use of anticoagulant therapy 08/27/2014  . Paroxysmal atrial fibrillation ([place]); CHA2DSVasc - 4; Now on Eliquis 08/20/2014    Class: Diagnosis of  . Chronic kidney disease (CKD), active medical management without dialysis, stage 4 (severe) ([place]) 08/20/2014  . Hereditary and idiopathic peripheral neuropathy 01/12/2014  . Obesity (BMI 30-39.9) 09/03/2013  . H/O syncope 09/03/2013  . Right-sided carotid artery disease; followed by Dr. Trula Slade 03/02/2013  . Hyperlipidemia with target LDL less than 70 03/02/2013  . Migraine without aura 10/26/2012  . Anemia 10/23/2008  . GLAUCOMA 10/23/2008  . Essential hypertension 10/22/2008  . Arthropathy 10/22/2008  . Sleep apnea 10/22/2008  . Personal history of prostate cancer 10/22/2008  . History of colonic polyps 10/22/2008   Past Surgical History:  Procedure Laterality Date  . APPENDECTOMY    . CARDIAC  CATHETERIZATION N/A 03/21/2015   Procedure: Left Heart Cath and Coronary Angiography;  Surgeon: Jettie Booze, MD;  Location: [place] [facility];  Service: Cardiovascular;  Laterality: N/A;; 80% pLAD  . CARDIAC CATHETERIZATION  03/21/2015   Procedure: Coronary Stent Intervention;  Surgeon: Jettie Booze, MD;  Location: [place] [facility];  Service: Cardiovascular;;pLAD Synergy DES 2.75 mmx 16 mm -- 3.3 mm  . CARDIAC CATHETERIZATION N/A 06/09/2016   Procedure: LEFT HEART CATHETERIZATION WITH CORNARY ANGIOGRAPHY.  Surgeon: [name], MD;  Location: [place] [facility];  Service: Cardiovascular.  Essentially stable coronaries, but to 85% lesion proximal to prior LAD stent with 40% proximal stent ISR. FFR was significantly positive.  Marland Kitchen CARDIAC CATHETERIZATION N/A 06/09/2016   Procedure: Coronary Stent Intervention;  Surgeon: [name], MD;  Location: [place] [facility];  Service: Cardiovascular: FFR Guided PCI of pLAD ~80% pre-stent & 40% ISR --> Synergy DES 3.0 x12  (3.6 mm extends to~ LM)  . CARDIOVERSION N/A 08/22/2014   Procedure: CARDIOVERSION;  Surgeon: Josue Hector, MD;  Location: [facility] ENDOSCOPY;  Service: Cardiovascular;  Laterality: N/A;  . CAROTID DOPPLER  10/21/2012   Continues to have 60 to 79% right carotid.  Left carotid < 40%.  Normal vertebral and subclavian arteries bilaterally.  (Stable.  Follow-up 1 year.)  . CATARACT EXTRACTION W/ INTRAOCULAR LENS  IMPLANT, BILATERAL Bilateral   . COLONOSCOPY    . INSERTION PROSTATE RADIATION SEED  04/2007  . KNEE ARTHROSCOPY Bilateral   . LEFT HEART CATH AND CORONARY ANGIOGRAPHY N/A 04/26/2019   Procedure: LEFT HEART CATH AND CORONARY ANGIOGRAPHY;  Surgeon: Marykay Lex, MD;  Location: [facility] INVASIVE CV LAB;  Service: Cardiovascular;Widely patent LAD stents.  Normal LVEDP.  Evidence of moderate-severe aortic stenosis with mean gradient 31 milli-mercury and P-peak gradient of 36 mmHg  . NM MYOVIEW LTD  05/2018   a) 08/2014: 60%.  Fixed inferior defect likely diaphragmatic attenuation. LOW RISK. ;; b) 05/2018 Lexiscan - EF 55-60%. LOW RISK. No ischemia or infarction.  . TEE WITHOUT CARDIOVERSION N/A 08/22/2014   Procedure: TRANSESOPHAGEAL ECHOCARDIOGRAM (TEE);  Surgeon: Wendall Stade, MD;  Location: [facility] ENDOSCOPY;  Service: Cardiovascular;  Laterality: N/A;  . TEE WITHOUT CARDIOVERSION N/A 12/12/2019   Procedure: TRANSESOPHAGEAL ECHOCARDIOGRAM (TEE);  Surgeon: Tonny Bollman, MD;  Location: [facility] OR;  Service: Open Heart Surgery;  Laterality: N/A;  . TONSILLECTOMY AND ADENOIDECTOMY    . TOTAL KNEE ARTHROPLASTY Right 05/2009  . TRANSTHORACIC ECHOCARDIOGRAM  03/'20, 9'20   a) EF 60 to 65%.  Mild to moderate MR.  Moderate aortic calcification.  Mild to mod AS.  Mean gradient 22 mmHg;; b)  Normal LV size and function EF 60 to 65%.  Trivial AI, mod AS with mean gradient estimated 20 mmHg (no change from March 2019)  . TRANSTHORACIC ECHOCARDIOGRAM  01/10/2020   1st out-of-hospital post TAVR echo: EF 60 to 65%.  GR one DD.  No R WMA.  Normal RV.  26 mm Edwards SAPIEN prosthetic TAVR present.  No perivalvular AI.  No stenosis.  Mean gradient 13 mmHg.  Stable from initial post TAVR gradients.    Family History  Problem Relation Age of Onset  . Ovarian cancer Mother   . Suicidality Father   . Other Brother        murdered  . Other Brother        MVA, deceased  . Testicular cancer Son   . Colon cancer Neg Hx   . Esophageal cancer Neg Hx   . Stomach cancer Neg Hx   . Rectal cancer Neg Hx    No Known Allergies Outpatient Encounter Medications as of 08/14/2020  Medication Sig  . diltiazem (CARDIZEM CD) 180 MG 24 hr capsule TAKE (1) CAPSULE DAILY.  Marland Kitchen ELIQUIS 2.5 MG TABS tablet TAKE 1 TABLET BY MOUTH TWICE DAILY.  . ferrous sulfate 325 (65 FE) MG tablet Take 325 mg by mouth daily with breakfast.  . furosemide (LASIX) 40 MG tablet Take 0.5 tablets (20 mg total) by mouth daily.  . isosorbide mononitrate (IMDUR) 60 MG 24 hr tablet TAKE  1 TABLET ONCE DAILY AFTER BREAKFAST.  . Multiple Vitamin (MULTIVITAMIN PO) Take 1 tablet by mouth daily.   . rosuvastatin (CRESTOR) 20 MG tablet TAKE (1) TABLET DAILY AT BEDTIME.  Marland Kitchen acetaminophen (TYLENOL) 325 MG tablet Take 325-650 mg by mouth every 6 (six) hours as needed for moderate pain or headache.   Marland Kitchen amoxicillin (AMOXIL) 500 MG tablet Take 2,000 mg (4 capsules) 1 hour prior to all dental visits.  Marland Kitchen loperamide (IMODIUM A-D) 2 MG tablet Take 2 mg by mouth 4 (four) times daily as needed for diarrhea or loose stools.   . nitroGLYCERIN (NITROSTAT) 0.4 MG SL tablet ONE TABLET UNDER TONGUE WHEN NEEDED FOR CHEST PAIN. MAY REPEAT IN 5 MINUTES.  Marland Kitchen topiramate (TOPAMAX) 200 MG tablet TAKE 1 TABLET ONCE DAILY.  . vitamin B-12 (CYANOCOBALAMIN) 1000 MCG tablet Take 1,000 mcg by mouth daily.    No facility-administered encounter medications on file as of 08/14/2020.   Patient Care Team    Relationship Specialty Notifications Start End  [name], MD PCP - General Family Medicine  01/11/19   [name], MD PCP - Cardiology Cardiology Admissions 09/08/17   Lorretta Harp, MD Consulting Physician Cardiology  01/05/14   Corliss Parish, MD Consulting Physician Nephrology  01/05/14   Lafayette Dragon, MD (Inactive) Consulting Physician Gastroenterology  01/05/14   Lennon Alstrom, MD Consulting Physician Neurology  01/16/14   [name], [facility] Pharmacist Pharmacist  07/24/20    Current Diagnosis/Assessment: Goals Addressed            This Visit's Progress   . PharmD Care Plan       CARE PLAN ENTRY (see longitudinal plan of care for additional care plan information)  Current Barriers:  . Chronic Disease Management support, education, and care coordination needs related to Hypertension, Hyperlipidemia, and Atrial Fibrillation   Hypertension BP Readings from Last 3 Encounters:  07/19/20 136/71  06/17/20 134/72  04/07/20 137/85   . Pharmacist Clinical Goal(s): o Over the next 365  days, patient will work with PharmD and providers to maintain BP goal <140/90 . Current regimen:  o Isosorbide mononitrate 60 mg once daily after breakfast (coronary artery disease) o Diltiazem CD 180 mg once daily (heart rate control in atrial fibrillation, some blood pressure lowering) o Furosemide 40 mg - half tablet once daily (reduces fluid retention) . Interventions: o Reviewed diet and exercise - Maintain a healthy weight and exercise regularly, as directed by your health care provider. Eat healthy foods, such as: Lean proteins, complex carbohydrates, fresh fruits and vegetables, low-fat dairy products, healthy fats. o Reviewed recommendations for home monitoring of blood pressure . Patient self care activities - Over the next 365 days, patient will: o Check BP at least once every 1-2 weeks, document, and provide at future appointments o Ensure daily salt intake < 2300 mg/day  Hyperlipidemia Lab Results  Component Value Date/Time   LDLCALC 46 11/29/2019 02:24 AM   LDLDIRECT 52.0 09/12/2018 01:57 PM   . Pharmacist Clinical Goal(s): o Over the next 365 days, patient will work with PharmD and providers to maintain LDL goal < 70 . Current regimen:  o Rosuvastatin 20 mg once daily (cholesterol lowering, prevention of cardiovascular events) . Interventions: o Reviewed diet/exercise - see blood pressure section . Patient self care activities - Over the next 365 days, patient will: o Continue current management  Stroke prevention in atrial fibrillation . Pharmacist Clinical Goal(s) o Over the next 365 days, patient will work with PharmD and providers to ensure medication safety and affordability/accessibility . Current regimen:  o Eliquis 2.5 mg twice daily . Interventions: o Reviewed side effects - no issues noted o Reviewed pharmacy services including pill packaging to help will missed doses - declined but will let pharmacist know if wanting to pursue. . Patient self care  activities - Over the next 365 days, patient will: o Let pharmacist know if there are any cost concerns - would help apply for patient assistance if eligible  Medication management . Pharmacist Clinical Goal(s): o Over the next 365 days, patient will work with PharmD and providers to maintain optimal medication adherence . Current pharmacy: [facility] . Interventions o Comprehensive medication review performed. o Continue current medication management strategy. . Patient self care activities - Over the next 365 days, patient will: o Take medications as prescribed. o Report any questions or concerns to PharmD and/or provider(s) Initial goal documentation.      Hypertension/CAD   BP goal <140/90  BP Readings  from Last 3 Encounters:  07/19/20 136/71  06/17/20 134/72  04/07/20 137/85    BMP Latest Ref Rng & Units 07/19/2020 04/07/2020 01/10/2020  Glucose 65 - 99 mg/dL 115(H) 107(H) 100(H)  BUN 7 - 25 mg/dL 56(H) 52(H) 46(H)  Creatinine 0.70 - 1.11 mg/dL 2.97(H) 2.45(H) 2.51(H)  BUN/Creat Ratio 6 - 22 (calc) 19 - 18  Sodium 135 - 146 mmol/L 141 140 142  Potassium 3.5 - 5.3 mmol/L 4.9 4.9 4.7  Chloride 98 - 110 mmol/L 111(H) 112(H) 109(H)  CO2 20 - 32 mmol/L 22 18(L) 21  Calcium 8.6 - 10.3 mg/dL 8.3(L) 8.5(L) 8.5(L)   Previous medications: cozaar 25 mg, cozaar 50 mg.  Using new step machine 4-5x/wk, 15 to 20 minutes a session.  Does no follow specific diet. Examples meals/snacks provided - cherios w/ fruit in the morning, peanutbutter and crackers as main snack, tomato/cucumber sandwich. Patient has cuff at home but checks BP infrequently. Recent home readings: n/a.  Patient is currently at goal on the following medications:  . Isosorbide mononitrate 60 mg once daily after breakfast (Dr. Ellyn Hack) . Nitrostat 0.4 mg . Diltiazem 180 mg once daily  We discussed diet and exercise - Maintain a healthy weight and exercise regularly, as directed by your health care provider.  Eat healthy foods, such as: Lean proteins, complex carbohydrates, fresh fruits and vegetables, low-fat dairy products, healthy fats. Reviewed home monitoring recommendations for blood pressure.  Plan  Continue current medications.  Hyperlipidemia   LDL goal < 70  Lipid Panel     Component Value Date/Time   CHOL 91 11/29/2019 0224   TRIG 49 11/29/2019 0224   HDL 35 (L) 11/29/2019 0224   LDLCALC 46 11/29/2019 0224   LDLDIRECT 52.0 09/12/2018 1357    Hepatic Function Latest Ref Rng & Units 07/19/2020 12/08/2019 11/29/2019  Total Protein 6.1 - 8.1 g/dL 6.0(L) 6.6 5.4(L)  Albumin 3.5 - 5.0 g/dL - 3.5 2.8(L)  AST 10 - 35 U/L $Remo'18 17 17  'ChrXn$ ALT 9 - 46 U/L $Remo'9 11 11  'UkryL$ Alk Phosphatase 38 - 126 U/L - 54 45  Total Bilirubin 0.2 - 1.2 mg/dL 0.4 0.4 0.5  Bilirubin, Direct 0.0 - 0.3 mg/dL - - -    Previous medications: crestor 5 mg, crestor 10 mg  Patient is currently at goal the following medications:  . Rosuvastatin 20 mg once daily  Reviewed side effects, cholesterol goals - no problems noted, currently at goal.  Plan  Continue current medications.  AFIB   Pulse Readings from Last 3 Encounters:  07/19/20 88  06/17/20 63  04/07/20 78   Previous medications Cardizem CD 120 mg, metoprolol succinate 25 mg daily. Patient is currently rate controlled on: Marland Kitchen Diltiazem 180 mg once daily  Stroke prevention. CHA2DS2/VAS 4-5. Missing at least one evening dose of Eliquis per month. Has pill box but does not like using. Denies any abnormal bruising, bleeding from nose or gums or blood in urine or stool. Patient is currently controlled on the following medications:  . Eliquis 2.5 mg once daily   Reviewed pharmacy services including pill packaging to help will missed doses - declined but will let pharmacist know if wanting to pursue.  Plan  Continue current medications.  Vaccines   Immunization History  Administered Date(s) Administered  . Fluad Quad(high Dose 65+) 05/11/2019, 05/16/2020   . Influenza Inj Mdck Quad Pf 05/26/2016  . Influenza Split 04/21/2012, 06/24/2014  . Influenza,inj,Quad PF,6+ Mos 05/26/2016  . Influenza-Unspecified 05/26/2016, 06/10/2017, 05/10/2018  .  Moderna Sars-Covid-2 Vaccination 03/14/2020, 04/11/2020  . Pneumococcal Conjugate-13 05/15/2014  . Pneumococcal Polysaccharide-23 09/28/2016  . Pneumococcal-Unspecified 09/28/2016  . Tdap 03/02/2019  . Zoster 08/10/2012  . Zoster Recombinat (Shingrix) 02/11/2018, 05/17/2018   Reviewed and discussed patient's vaccination history.   03/14/2020 and 04/11/2020 - [facility]. Booster due March 2022.  Plan  No recommendations at this time.   Medication Management / Care Coordination   Receives prescription medications from:  [place], [place] [address] Phone: [phone] Fax: [place], [place] - [address] [place] [address] Phone: [phone] Fax: [phone]   Denies any issues with medication management. However reports missing 1+ dose of Eliquis per week, pill packaging/pharmacy services offered but declined.   Plan  Continue current medication management strategy. ___________________________ SDOH (Social Determinants of Health) assessments performed: Yes. Future Appointments  Date Time Provider [place]  08/15/2020  3:30 PM [facility] NURSE [facility]  08/15/2020  4:00 PM Lyndee Hensen, PT [facility]  08/19/2020  3:15 PM Lyndee Hensen, PT [facility]  08/22/2020  4:00 PM Lyndee Hensen, PT [facility]  08/27/2020  4:00 PM Lyndee Hensen, PT [facility]  08/29/2020  4:00 PM Lyndee Hensen, PT [facility]  01/28/2021  4:00 PM [name], MD [facility]  04/15/2021 10:30 AM [facility] CCM PHARMACIST [facility]   Visit follow-up:  . CPA follow-up: n/a. Marland Kitchen RPH follow-up: 8-10  month f/u telephone call. eliquis PAP as applicable, home bp, diet/exercise.  [name], Pharm.D., BCGP Clinical Pharmacist [facility] [phone]

## 2020-08-13 ENCOUNTER — Telehealth: Payer: Self-pay

## 2020-08-13 ENCOUNTER — Other Ambulatory Visit: Payer: Self-pay

## 2020-08-13 ENCOUNTER — Encounter: Payer: Self-pay | Admitting: Physical Therapy

## 2020-08-13 ENCOUNTER — Ambulatory Visit (INDEPENDENT_AMBULATORY_CARE_PROVIDER_SITE_OTHER): Payer: Medicare Other | Admitting: Physical Therapy

## 2020-08-13 DIAGNOSIS — R2689 Other abnormalities of gait and mobility: Secondary | ICD-10-CM

## 2020-08-13 NOTE — Progress Notes (Signed)
Chronic Care Management Pharmacy Assistant   Name: Mark Jackson.  MRN: [id number] DOB: 22-May-1930  Reason for Encounter: Medication Review /Initial Visit   Mark Jackson.,  87 y.o. , male presents for their Initial CCM visit with the clinical pharmacist In office.  PCP : [name], MD  Allergies:  No Known Allergies  Medications: Outpatient Encounter Medications as of 08/13/2020  Medication Sig  . acetaminophen (TYLENOL) 325 MG tablet Take 325-650 mg by mouth every 6 (six) hours as needed for moderate pain or headache.   Marland Kitchen amoxicillin (AMOXIL) 500 MG tablet Take 2,000 mg (4 capsules) 1 hour prior to all dental visits.  Marland Kitchen diltiazem (CARDIZEM CD) 180 MG 24 hr capsule TAKE (1) CAPSULE DAILY.  Marland Kitchen ELIQUIS 2.5 MG TABS tablet TAKE 1 TABLET BY MOUTH TWICE DAILY.  . ferrous sulfate 325 (65 FE) MG tablet Take 325 mg by mouth daily with breakfast.  . furosemide (LASIX) 40 MG tablet Take 0.5 tablets (20 mg total) by mouth daily.  . isosorbide mononitrate (IMDUR) 60 MG 24 hr tablet TAKE 1 TABLET ONCE DAILY AFTER BREAKFAST.  Marland Kitchen loperamide (IMODIUM A-D) 2 MG tablet Take 2 mg by mouth 4 (four) times daily as needed for diarrhea or loose stools.   . Multiple Vitamin (MULTIVITAMIN PO) Take 1 tablet by mouth daily.   . nitroGLYCERIN (NITROSTAT) 0.4 MG SL tablet ONE TABLET UNDER TONGUE WHEN NEEDED FOR CHEST PAIN. MAY REPEAT IN 5 MINUTES.  . rosuvastatin (CRESTOR) 20 MG tablet TAKE (1) TABLET DAILY AT BEDTIME.  Marland Kitchen topiramate (TOPAMAX) 200 MG tablet TAKE 1 TABLET ONCE DAILY.  . vitamin B-12 (CYANOCOBALAMIN) 1000 MCG tablet Take 1,000 mcg by mouth daily.    No facility-administered encounter medications on file as of 08/13/2020.    Current Diagnosis: Patient Active Problem List   Diagnosis Date Noted  . Insomnia, psychophysiological 02/28/2020  . Severe aortic stenosis 12/12/2019  . Chronic diastolic heart failure ([place]) 12/12/2019  . S/P TAVR (transcatheter aortic valve replacement)  12/12/2019  . Syncope and collapse 11/27/2019  . Hyperglycemia 09/27/2017  . Bilateral lower extremity edema 04/15/2017  . B12 deficiency 01/06/2017  . Chronic diarrhea 01/06/2017  . BPH associated with nocturia 06/15/2016  . Perianal dermatitis 06/19/2015  . Rectal bleeding 04/25/2015  . CAD S/P DES PCI to proximal LAD 03/22/2015  . Long term current use of anticoagulant therapy 08/27/2014  . Paroxysmal atrial fibrillation ([place]); CHA2DSVasc - 4; Now on Eliquis 08/20/2014    Class: Diagnosis of  . Chronic kidney disease (CKD), active medical management without dialysis, stage 4 (severe) ([place]) 08/20/2014  . Hereditary and idiopathic peripheral neuropathy 01/12/2014  . Obesity (BMI 30-39.9) 09/03/2013  . H/O syncope 09/03/2013  . Right-sided carotid artery disease; followed by Dr. Trula Slade 03/02/2013  . Hyperlipidemia with target LDL less than 70 03/02/2013  . Migraine without aura 10/26/2012  . Anemia 10/23/2008  . GLAUCOMA 10/23/2008  . Essential hypertension 10/22/2008  . Arthropathy 10/22/2008  . Sleep apnea 10/22/2008  . Personal history of prostate cancer 10/22/2008  . History of colonic polyps 10/22/2008     Have you seen any other providers since your last visit?             Patient states he has not seen any providers since last visit.  Any changes in your medications or health?         Patient states no changes in medications or health.  Any side effects from any medications?  Patient states no side effects from any medications.  Do you have an symptoms or problems not managed by your medications?        Patient states he has aches and pains but assumes it is from getting older.  Any concerns about your health right now?     Patient stated no concerns other than the aches and pains  Has your provider asked that you check blood pressure, blood sugar, or follow special diet at home?       Patient stated he does not check blood sugar or blood pressure and does  not follow a special diet at home. However he states he does not eat broccoli, squash or kale.  Do you get any type of exercise on a regular basis?           Patient stated he does get exercise on his new step machine.   Can you think of a goal you would like to reach for your health?               Patient stated he would love to be 87 years old again.  Do you have any problems getting your medications?               Patient stated he has no problems getting medications.  Is there anything that you would like to discuss during the appointment?          Patient stated he has arthritis but not sure if cpp could help with that.  Please bring medications and supplements to appointment  Georgiana Shore ,[facility] Clinical Pharmacist Assistant [phone]  Follow-Up:  Pharmacist Review
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
## 2020-08-14 ENCOUNTER — Ambulatory Visit: Payer: Medicare Other

## 2020-08-14 DIAGNOSIS — I480 Paroxysmal atrial fibrillation: Secondary | ICD-10-CM

## 2020-08-14 DIAGNOSIS — R739 Hyperglycemia, unspecified: Secondary | ICD-10-CM

## 2020-08-14 DIAGNOSIS — I10 Essential (primary) hypertension: Secondary | ICD-10-CM

## 2020-08-14 DIAGNOSIS — E785 Hyperlipidemia, unspecified: Secondary | ICD-10-CM

## 2020-08-14 NOTE — Patient Instructions (Signed)
[name],  Thank you for taking the time to review your medications with me today.  I have included our care plan/goals in the following pages. Please review and call me at [phone] with any questions!  Thanks! Mark Jackson, Pharm.D., BCGP Clinical Pharmacist [facility] [phone]  Goals Addressed            This Visit's Progress   . PharmD Care Plan       CARE PLAN ENTRY (see longitudinal plan of care for additional care plan information)  Current Barriers:  . Chronic Disease Management support, education, and care coordination needs related to Hypertension, Hyperlipidemia, and Atrial Fibrillation   Hypertension BP Readings from Last 3 Encounters:  07/19/20 136/71  06/17/20 134/72  04/07/20 137/85   . Pharmacist Clinical Goal(s): o Over the next 365 days, patient will work with PharmD and providers to maintain BP goal <140/90 . Current regimen:  o Isosorbide mononitrate 60 mg once daily after breakfast (coronary artery disease) o Diltiazem CD 180 mg once daily (heart rate control in atrial fibrillation, some blood pressure lowering) o Furosemide 40 mg - half tablet once daily (reduces fluid retention) . Interventions: o Reviewed diet and exercise - Maintain a healthy weight and exercise regularly, as directed by your health care provider. Eat healthy foods, such as: Lean proteins, complex carbohydrates, fresh fruits and vegetables, low-fat dairy products, healthy fats. o Reviewed recommendations for home monitoring of blood pressure . Patient self care activities - Over the next 365 days, patient will: o Check BP at least once every 1-2 weeks, document, and provide at future appointments o Ensure daily salt intake < 2300 mg/day  Hyperlipidemia Lab Results  Component Value Date/Time   LDLCALC 46 11/29/2019 02:24 AM   LDLDIRECT 52.0 09/12/2018 01:57 PM   . Pharmacist Clinical Goal(s): o Over the next 365 days, patient  will work with PharmD and providers to maintain LDL goal < 70 . Current regimen:  o Rosuvastatin 20 mg once daily (cholesterol lowering, prevention of cardiovascular events) . Interventions: o Reviewed diet/exercise - see blood pressure section . Patient self care activities - Over the next 365 days, patient will: o Continue current management  Stroke prevention in atrial fibrillation . Pharmacist Clinical Goal(s) o Over the next 365 days, patient will work with PharmD and providers to ensure medication safety and affordability/accessibility . Current regimen:  o Eliquis 2.5 mg twice daily . Interventions: o Reviewed side effects - no issues noted o Reviewed pharmacy services including pill packaging to help will missed doses - declined but will let pharmacist know if wanting to pursue. . Patient self care activities - Over the next 365 days, patient will: o Let pharmacist know if there are any cost concerns - would help apply for patient assistance if eligible  Medication management . Pharmacist Clinical Goal(s): o Over the next 365 days, patient will work with PharmD and providers to maintain optimal medication adherence . Current pharmacy: [facility] . Interventions o Comprehensive medication review performed. o Continue current medication management strategy. . Patient self care activities - Over the next 365 days, patient will: o Take medications as prescribed. o Report any questions or concerns to PharmD and/or provider(s) Initial goal documentation.      The patient verbalized understanding of instructions provided today and agreed to receive a MyChart copy of patient instruction and/or educational materials. Telephone follow up appointment with pharmacy team member scheduled for:  See next appointment with "Care Management Staff" under "What's Next" below.   Hypertension, Adult High blood pressure (hypertension) is when the force of blood pumping through the  arteries is too strong. The arteries are the blood vessels that carry blood from the heart throughout the body. Hypertension forces the heart to work harder to pump blood and may cause arteries to become narrow or stiff. Untreated or uncontrolled hypertension can cause a heart attack, heart failure, a stroke, kidney disease, and other problems. A blood pressure reading consists of a higher number over a lower number. Ideally, your blood pressure should be below 120/80. The first ("top") number is called the systolic pressure. It is a measure of the pressure in your arteries as your heart beats. The second ("bottom") number is called the diastolic pressure. It is a measure of the pressure in your arteries as the heart relaxes. What are the causes? The exact cause of this condition is not known. There are some conditions that result in or are related to high blood pressure. What increases the risk? Some risk factors for high blood pressure are under your control. The following factors may make you more likely to develop this condition:  Smoking.  Having type 2 diabetes mellitus, high cholesterol, or both.  Not getting enough exercise or physical activity.  Being overweight.  Having too much fat, sugar, calories, or salt (sodium) in your diet.  Drinking too much alcohol. Some risk factors for high blood pressure may be difficult or impossible to change. Some of these factors include:  Having chronic kidney disease.  Having a family history of high blood pressure.  Age. Risk increases with age.  Race. You may be at higher risk if you are African American.  Gender. Men are at higher risk than women before age 73. After age 58, women are at higher risk than men.  Having obstructive sleep apnea.  Stress. What are the signs or symptoms? High blood pressure may not cause symptoms. Very high blood pressure (hypertensive crisis) may cause:  Headache.  Anxiety.  Shortness of  breath.  Nosebleed.  Nausea and vomiting.  Vision changes.  Severe chest pain.  Seizures. How is this diagnosed? This condition is diagnosed by measuring your blood pressure while you are seated, with your arm resting on a flat surface, your legs uncrossed, and your feet flat on the floor. The cuff of the blood pressure monitor will be placed directly against the skin of your upper arm at the level of your heart. It should be measured at least twice using the same arm. Certain conditions can cause a difference in blood pressure between your right and left arms. Certain factors can cause blood pressure readings to be lower or higher than normal for a short period of time:  When your blood pressure is higher when you are in a health care provider's office than when you are at home, this is called white coat hypertension. Most people with this condition do not need medicines.  When your blood pressure is higher at home than when you are in a health care provider's office, this is called masked hypertension. Most people with this condition may need medicines to control blood pressure. If you have a high blood pressure reading during one visit or you have normal blood pressure with other risk factors, you may be asked to:  Return on a different day to have your blood pressure checked again.  Monitor your blood pressure at home for 1 week  or longer. If you are diagnosed with hypertension, you may have other blood or imaging tests to help your health care provider understand your overall risk for other conditions. How is this treated? This condition is treated by making healthy lifestyle changes, such as eating healthy foods, exercising more, and reducing your alcohol intake. Your health care provider may prescribe medicine if lifestyle changes are not enough to get your blood pressure under control, and if:  Your systolic blood pressure is above 130.  Your diastolic blood pressure is above  80. Your personal target blood pressure may vary depending on your medical conditions, your age, and other factors. Follow these instructions at home: Eating and drinking   Eat a diet that is high in fiber and potassium, and low in sodium, added sugar, and fat. An example eating plan is called the DASH (Dietary Approaches to Stop Hypertension) diet. To eat this way: ? Eat plenty of fresh fruits and vegetables. Try to fill one half of your plate at each meal with fruits and vegetables. ? Eat whole grains, such as whole-wheat pasta, brown rice, or whole-grain bread. Fill about one fourth of your plate with whole grains. ? Eat or drink low-fat dairy products, such as skim milk or low-fat yogurt. ? Avoid fatty cuts of meat, processed or cured meats, and poultry with skin. Fill about one fourth of your plate with lean proteins, such as fish, chicken without skin, beans, eggs, or tofu. ? Avoid pre-made and processed foods. These tend to be higher in sodium, added sugar, and fat.  Reduce your daily sodium intake. Most people with hypertension should eat less than 1,500 mg of sodium a day.  Do not drink alcohol if: ? Your health care provider tells you not to drink. ? You are pregnant, may be pregnant, or are planning to become pregnant.  If you drink alcohol: ? Limit how much you use to:  0-1 drink a day for women.  0-2 drinks a day for men. ? Be aware of how much alcohol is in your drink. In the [place] one drink equals one 12 oz bottle of beer (355 mL), one 5 oz glass of wine (148 mL), or one 1 oz glass of hard liquor (44 mL). Lifestyle   Work with your health care provider to maintain a healthy body weight or to lose weight. Ask what an ideal weight is for you.  Get at least 30 minutes of exercise most days of the week. Activities may include walking, swimming, or biking.  Include exercise to strengthen your muscles (resistance exercise), such as Pilates or lifting weights, as part of  your weekly exercise routine. Try to do these types of exercises for 30 minutes at least 3 days a week.  Do not use any products that contain nicotine or tobacco, such as cigarettes, e-cigarettes, and chewing tobacco. If you need help quitting, ask your health care provider.  Monitor your blood pressure at home as told by your health care provider.  Keep all follow-up visits as told by your health care provider. This is important. Medicines  Take over-the-counter and prescription medicines only as told by your health care provider. Follow directions carefully. Blood pressure medicines must be taken as prescribed.  Do not skip doses of blood pressure medicine. Doing this puts you at risk for problems and can make the medicine less effective.  Ask your health care provider about side effects or reactions to medicines that you should watch for. Contact a health care  provider if you:  Think you are having a reaction to a medicine you are taking.  Have headaches that keep coming back (recurring).  Feel dizzy.  Have swelling in your ankles.  Have trouble with your vision. Get help right away if you:  Develop a severe headache or confusion.  Have unusual weakness or numbness.  Feel faint.  Have severe pain in your chest or abdomen.  Vomit repeatedly.  Have trouble breathing. Summary  Hypertension is when the force of blood pumping through your arteries is too strong. If this condition is not controlled, it may put you at risk for serious complications.  Your personal target blood pressure may vary depending on your medical conditions, your age, and other factors. For most people, a normal blood pressure is less than 120/80.  Hypertension is treated with lifestyle changes, medicines, or a combination of both. Lifestyle changes include losing weight, eating a healthy, low-sodium diet, exercising more, and limiting alcohol. This information is not intended to replace advice given  to you by your health care provider. Make sure you discuss any questions you have with your health care provider. Document Revised: 04/06/2018 Document Reviewed: 04/06/2018 Elsevier Patient Education  2020 [employer].

## 2020-08-15 ENCOUNTER — Ambulatory Visit (INDEPENDENT_AMBULATORY_CARE_PROVIDER_SITE_OTHER): Payer: Medicare Other | Admitting: Physical Therapy

## 2020-08-15 ENCOUNTER — Other Ambulatory Visit: Payer: Self-pay

## 2020-08-15 ENCOUNTER — Ambulatory Visit (INDEPENDENT_AMBULATORY_CARE_PROVIDER_SITE_OTHER): Payer: Medicare Other

## 2020-08-15 ENCOUNTER — Encounter: Payer: Medicare Other | Admitting: Physical Therapy

## 2020-08-15 ENCOUNTER — Ambulatory Visit: Payer: Medicare Other

## 2020-08-15 DIAGNOSIS — E538 Deficiency of other specified B group vitamins: Secondary | ICD-10-CM

## 2020-08-15 DIAGNOSIS — R2689 Other abnormalities of gait and mobility: Secondary | ICD-10-CM | POA: Diagnosis not present

## 2020-08-15 MED ORDER — CYANOCOBALAMIN 1000 MCG/ML IJ SOLN
1000.0000 ug | Freq: Once | INTRAMUSCULAR | Status: AC
  Administered 2020-08-15: 1000 ug via INTRAMUSCULAR

## 2020-08-15 NOTE — Progress Notes (Signed)
Mark Jackson 87 yr old man presents to office today for weekly B12 injection per [name], MD. Administered CYANOCOBALAMIN 1,000 mcg IM left arm. Patient tolerated well.

## 2020-08-15 NOTE — Progress Notes (Signed)
I have reviewed and agree with note, evaluation, plan.   Danice Dippolito, MD  

## 2020-08-16 ENCOUNTER — Other Ambulatory Visit: Payer: Self-pay | Admitting: Cardiology

## 2020-08-17 ENCOUNTER — Encounter: Payer: Self-pay | Admitting: Physical Therapy
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
## 2020-08-19 ENCOUNTER — Encounter: Payer: Self-pay | Admitting: Physical Therapy

## 2020-08-19 ENCOUNTER — Other Ambulatory Visit: Payer: Self-pay

## 2020-08-19 ENCOUNTER — Ambulatory Visit (INDEPENDENT_AMBULATORY_CARE_PROVIDER_SITE_OTHER): Payer: Medicare Other | Admitting: Physical Therapy

## 2020-08-19 VITALS — HR 68

## 2020-08-19 DIAGNOSIS — R2689 Other abnormalities of gait and mobility: Secondary | ICD-10-CM | POA: Diagnosis not present
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
## 2020-08-22 ENCOUNTER — Ambulatory Visit (INDEPENDENT_AMBULATORY_CARE_PROVIDER_SITE_OTHER): Payer: Medicare Other | Admitting: Physical Therapy

## 2020-08-22 ENCOUNTER — Encounter: Payer: Self-pay | Admitting: Physical Therapy

## 2020-08-22 ENCOUNTER — Other Ambulatory Visit: Payer: Self-pay

## 2020-08-22 DIAGNOSIS — R2689 Other abnormalities of gait and mobility: Secondary | ICD-10-CM
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
## 2020-08-27 ENCOUNTER — Encounter: Payer: Self-pay | Admitting: Physical Therapy

## 2020-08-27 ENCOUNTER — Ambulatory Visit (INDEPENDENT_AMBULATORY_CARE_PROVIDER_SITE_OTHER): Payer: Medicare Other | Admitting: Physical Therapy

## 2020-08-27 ENCOUNTER — Other Ambulatory Visit: Payer: Self-pay

## 2020-08-27 DIAGNOSIS — R2689 Other abnormalities of gait and mobility: Secondary | ICD-10-CM | POA: Diagnosis not present

## 2020-08-28 ENCOUNTER — Encounter: Payer: Self-pay | Admitting: Physical Therapy
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
## 2020-08-29 ENCOUNTER — Encounter: Payer: Medicare Other | Admitting: Physical Therapy

## 2020-08-29 ENCOUNTER — Other Ambulatory Visit: Payer: Self-pay | Admitting: Family Medicine

## 2020-08-29 NOTE — Telephone Encounter (Signed)
Last OV 07/19/20 Last refill 05/20/20 #90/0 Next OV not scheduled

## 2020-09-02 ENCOUNTER — Ambulatory Visit (INDEPENDENT_AMBULATORY_CARE_PROVIDER_SITE_OTHER): Payer: Medicare Other | Admitting: Physical Therapy

## 2020-09-02 ENCOUNTER — Other Ambulatory Visit: Payer: Self-pay

## 2020-09-02 ENCOUNTER — Ambulatory Visit (INDEPENDENT_AMBULATORY_CARE_PROVIDER_SITE_OTHER): Payer: Medicare Other | Admitting: Cardiology

## 2020-09-02 ENCOUNTER — Encounter: Payer: Self-pay | Admitting: Cardiology

## 2020-09-02 VITALS — BP 160/70 | HR 76 | Ht 68.0 in | Wt 186.6 lb

## 2020-09-02 DIAGNOSIS — N184 Chronic kidney disease, stage 4 (severe): Secondary | ICD-10-CM | POA: Diagnosis not present

## 2020-09-02 DIAGNOSIS — R55 Syncope and collapse: Secondary | ICD-10-CM | POA: Diagnosis not present

## 2020-09-02 DIAGNOSIS — I480 Paroxysmal atrial fibrillation: Secondary | ICD-10-CM | POA: Diagnosis not present

## 2020-09-02 DIAGNOSIS — I10 Essential (primary) hypertension: Secondary | ICD-10-CM

## 2020-09-02 DIAGNOSIS — Z7901 Long term (current) use of anticoagulants: Secondary | ICD-10-CM

## 2020-09-02 DIAGNOSIS — R600 Localized edema: Secondary | ICD-10-CM

## 2020-09-02 DIAGNOSIS — I5032 Chronic diastolic (congestive) heart failure: Secondary | ICD-10-CM

## 2020-09-02 DIAGNOSIS — Z952 Presence of prosthetic heart valve: Secondary | ICD-10-CM | POA: Diagnosis not present

## 2020-09-02 DIAGNOSIS — I25119 Atherosclerotic heart disease of native coronary artery with unspecified angina pectoris: Secondary | ICD-10-CM

## 2020-09-02 DIAGNOSIS — R2689 Other abnormalities of gait and mobility: Secondary | ICD-10-CM | POA: Diagnosis not present

## 2020-09-02 MED ORDER — NITROGLYCERIN 0.4 MG SL SUBL: SUBLINGUAL_TABLET | SUBLINGUAL | 0 refills | Status: DC

## 2020-09-02 MED ORDER — DILTIAZEM HCL ER COATED BEADS 120 MG PO CP24: 120.0000 mg | ORAL_CAPSULE | Freq: Every day | ORAL | 3 refills | Status: DC

## 2020-09-02 MED ORDER — ISOSORBIDE MONONITRATE ER 60 MG PO TB24: ORAL_TABLET | ORAL | 3 refills | Status: DC

## 2020-09-02 NOTE — Patient Instructions (Signed)
Medication Instructions:    Decrease taking  Diltiazem to 120 mg daily     If Have to use Your sublingual Nitroglycerin tablets then for the next 2 day afterwards take Your Imdur ( isosorbide)  Twice a day - morning and evening or bedtime   *If you need a refill on your cardiac medications before your next appointment, please call your pharmacy*   Lab Work:  Not needed.   Testing/Procedures:  Not needed  Follow-Up: At [facility], you and your health needs are our priority.  As part of our continuing mission to provide you with exceptional heart care, we have created designated Provider Care Teams.  These Care Teams include your primary Cardiologist (physician) and Advanced Practice Providers (APPs -  Physician Assistants and Nurse Practitioners) who all work together to provide you with the care you need, when you need it.     Your next appointment:   4 month(s)  The format for your next appointment:   In Person  Provider:   [name], MD   Other Instructions  KEEP hydrated - drink  Water , except  You can have Tea at lunch   try changing to Crystal light tea from your sweet tea   Keep feet elevated during  the day , when wearing compression socks wear them during the day and take them at bedtime   Recommends you purchase some compression  socks/hose from [employer] in [place] ,[place]. You do not need an prescription to purchase the items.  Address  [address] [place],  [address]  Phone  [phone]   Compression   strength   X  8-15 mmHg  or X 15-20 mmHg                           20-30 mmHg  30-40 mmHg. "Tommy cooper fit compression " You may also try a medical supply store, department store (i.e.- [employer], [employer], [employer], specialty shoe stores ( shoe market), [employer] and [employer]) or  Chartered loss adjuster uniform store.

## 2020-09-02 NOTE — Progress Notes (Signed)
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 evening or bedtime   *If you need a refill on your cardiac medications before your next appointment, please call your pharmacy*   Lab Work:  Not needed.   Testing/Procedures:  Not needed  Follow-Up: At [facility], you and your health needs are our priority.  As part of our continuing mission to provide you with exceptional heart care, we have created designated Provider Care Teams.  These Care Teams include your primary Cardiologist (physician) and Advanced Practice Providers (APPs -  Physician Assistants and Nurse Practitioners) who all work together to provide you with the care you need, when you need it.     Your next appointment:   4 month(s)  The format for your next appointment:   In Person  Provider:   [name], MD   Other Instructions  KEEP hydrated - drink  Water , except  You can have  Tea at lunch   try changing to Crystal light tea from your sweet tea   Keep feet elevated during  the day , when wearing compression socks wear them during the day and take them at bedtime   Recommends you purchase some compression  socks/hose from [facility] in [place] ,[place]. You do not need an prescription to purchase the items.  Address  [address] [place], [place] [address]  Phone  [phone]   Compression   strength   X  8-15 mmHg  or X 15-20 mmHg                           20-30 mmHg  30-40 mmHg. "Tommy cooper fit compression " You may also try a medical supply store, department store (i.e.- [place], [employer], [employer], specialty shoe stores ( shoe market),  [employer]) or  local pharmacy medical uniform store.    Studies Ordered:   No orders of the defined types were placed in this encounter.    [name], M.D., M.S. Interventional Cardiologist   Pager # [phone] Phone # [phone] [address] [place], [place] [address]   Thank you for choosing Heartcare at [facility]!!
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
## 2020-09-05 ENCOUNTER — Other Ambulatory Visit: Payer: Self-pay

## 2020-09-05 ENCOUNTER — Ambulatory Visit (INDEPENDENT_AMBULATORY_CARE_PROVIDER_SITE_OTHER): Payer: Medicare Other | Admitting: Physical Therapy

## 2020-09-05 ENCOUNTER — Encounter: Payer: Self-pay | Admitting: Cardiology

## 2020-09-05 DIAGNOSIS — R2689 Other abnormalities of gait and mobility: Secondary | ICD-10-CM

## 2020-09-05 NOTE — Assessment & Plan Note (Signed)
The plan is to allow for permissive hypertension especially with him having orthostatic symptoms.  Currently on diltiazem and Imdur.  Plan: Reduce diltiazem dose to 1 mg daily.

## 2020-09-05 NOTE — Assessment & Plan Note (Signed)
Creatinine is up ~ 3 as of December.  Follow-up with Dr. Moshe Cipro from nephrology.  Still making urine..  Avoid hypotension, eating sugar adequate hydration as we discussed in orthostatic hypotension.  He does have edema in lower using stable dose of furosemide amenable monitor weights and edema levels.

## 2020-09-05 NOTE — Assessment & Plan Note (Addendum)
Last cath was in September 2020.  Stents are patent (overlapping LAD).  He has taken nitroglycerin twice this month, but not necessarily with exertion.  He does fine on his walking machine.  Plan:  He is now on Eliquis and therefore no longer on Plavix or aspirin.  He has taken nitroglycerin twice.  He is currently on Imdur. => My recommendation is that he takes his nitroglycerin tablet for chest discomfort, for the next 2 days, he should take twice the dose of Imdur-1 in the morning 1 in the evening.

## 2020-09-05 NOTE — Assessment & Plan Note (Signed)
Preserved EF with diastolic dysfunction.  No real heart failure symptoms.  I think the edema is probably related to venous stasis and not CHF.  Mild fatigue and poor balance which probably limits him more than true CHF symptoms. Except at worst NYHA Class II CHF symptoms, but could be considered class I.  Plan: Continue diltiazem for rate control and borderline antihypertensives along with standing dose of furosemide which he can also take additional doses as needed.

## 2020-09-05 NOTE — Assessment & Plan Note (Addendum)
Typically is very symptomatic when he is in A. fib.  As far as determined for the breakthrough spells. Heart rate is quite stable, but him having near syncopal episodes and orthostatic symptoms, Micronase just to reduce antihypertensive agents.  Plan: Reduce diltiazem to 120 mg daily. Continue Eliquis. => Okay to hold Eliquis for procedures or surgeries.  (24-48 hours for low risk procedures, but high risk procedures 72 hours)

## 2020-09-05 NOTE — Assessment & Plan Note (Signed)
Doing well on Eliquis.  We will continue for now.  No longer on aspirin or Plavix.  Okay to hold Eliquis preop for procedures or surgeries (24 to 40 hours for low risk and 72 hours for high risk.

## 2020-09-05 NOTE — Assessment & Plan Note (Signed)
Again I still prefer support stockings and foot elevation especially with orthostatic symptoms.  No PND orthopnea to suggest that this is CHF related.  Continue Lasix, but he still needs to hydrate.  We talked about using support stockings.

## 2020-09-05 NOTE — Assessment & Plan Note (Signed)
Most recent echo was in June.  Should be due for another one next year.  Valve appears to be working well.  He has not had the expected robust recovery since his TAVR.  His energy level is better, but he still has near syncopal spells and edema.  Does not have heart failure symptoms and has not passed out per se.  No longer on Plavix.  Simply on Eliquis. Follow-up recommendations echocardiogram per TAVR team.

## 2020-09-05 NOTE — Assessment & Plan Note (Signed)
He has not had a true syncopal episode since August.  He has had near syncope and poor balance. The catheter plan is permissive hypertension.  We stressed the importance of of hydration.  He is drinking lots of sweet tea which we need to replace with water.  We also need to make sure that he is eating appropriately which he says he is.  Also talked with the importance of compression stockings.  He can use even the light weight 8 to 10 pound compression stockings as long as he is able to tolerate it.

## 2020-09-08 ENCOUNTER — Encounter: Payer: Self-pay | Admitting: Physical Therapy
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
## 2020-09-09 ENCOUNTER — Encounter: Payer: Medicare Other | Admitting: Physical Therapy

## 2020-09-09 ENCOUNTER — Telehealth: Payer: Self-pay

## 2020-09-09 NOTE — Chronic Care Management (AMB) (Signed)
Patient has follow up appointment with [facility] scheduled for 04-15-21 at 10:30am.  Georgiana Shore ,[place] Pharmacist Assistant [phone]

## 2020-09-12 ENCOUNTER — Encounter: Payer: Self-pay | Admitting: Physical Therapy

## 2020-09-12 ENCOUNTER — Other Ambulatory Visit: Payer: Self-pay

## 2020-09-12 ENCOUNTER — Ambulatory Visit (INDEPENDENT_AMBULATORY_CARE_PROVIDER_SITE_OTHER): Payer: Medicare Other | Admitting: Physical Therapy

## 2020-09-12 DIAGNOSIS — R2689 Other abnormalities of gait and mobility: Secondary | ICD-10-CM | POA: Diagnosis not present

## 2020-09-22 ENCOUNTER — Encounter: Payer: Self-pay | Admitting: Physical Therapy
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
## 2020-09-25 ENCOUNTER — Telehealth: Payer: Self-pay

## 2020-09-25 NOTE — Telephone Encounter (Signed)
error 

## 2020-10-11 ENCOUNTER — Telehealth: Payer: Self-pay

## 2020-10-11 NOTE — Chronic Care Management (AMB) (Signed)
Chronic Care Management Pharmacy Assistant   Name: Mark Jackson.  MRN: [id number] DOB: 31-Jul-1930  Reason for Encounter: Disease State   Conditions to be addressed/monitored: HTN  Primary concerns for visit include: +ask if pt has received 3rd covid vaccine booster dose. ask if there has been issues with missing medication doses. pt had missed 1+ eliquis doses/wk last time.  Recent office visits:  None  Recent consult visits:  09/02/2020 (cardiology) Dr. Ellyn Hack; PAF,  Reduce diltiazem to 120 mg daily due to near syncopal episodes and orthostatic symptoms. For the next two days takes he should take twice the dose of Imdur-1 in the morning 1 in the evening  Hospital visits:  None in previous 6 months  Medications: Outpatient Encounter Medications as of 10/11/2020  Medication Sig   acetaminophen (TYLENOL) 325 MG tablet Take 325-650 mg by mouth every 6 (six) hours as needed for moderate pain or headache.    amoxicillin (AMOXIL) 500 MG tablet Take 2,000 mg (4 capsules) 1 hour prior to all dental visits.   diltiazem (CARDIZEM CD) 120 MG 24 hr capsule Take 1 capsule (120 mg total) by mouth daily.   ELIQUIS 2.5 MG TABS tablet TAKE 1 TABLET BY MOUTH TWICE DAILY.   ferrous sulfate 325 (65 FE) MG tablet Take 325 mg by mouth daily with breakfast.   furosemide (LASIX) 40 MG tablet Take 0.5 tablets (20 mg total) by mouth daily.   isosorbide mononitrate (IMDUR) 60 MG 24 hr tablet TAKE 1 TABLET ONCE DAILY AFTER BREAKFAST.and May take an additional 60 mg in the evening for 2 days   if you use Nitroglycerin sublingual tablets   loperamide (IMODIUM A-D) 2 MG tablet Take 2 mg by mouth 4 (four) times daily as needed for diarrhea or loose stools.    Multiple Vitamin (MULTIVITAMIN PO) Take 1 tablet by mouth daily.    nitroGLYCERIN (NITROSTAT) 0.4 MG SL tablet ONE TABLET UNDER TONGUE WHEN NEEDED FOR CHEST PAIN. MAY REPEAT IN 5 MINUTES.   rosuvastatin (CRESTOR) 20 MG tablet TAKE (1)  TABLET DAILY AT BEDTIME.   topiramate (TOPAMAX) 200 MG tablet TAKE 1 TABLET ONCE DAILY. (Patient taking differently: 100 mg.)   vitamin B-12 (CYANOCOBALAMIN) 1000 MCG tablet Take 1,000 mcg by mouth daily.    No facility-administered encounter medications on file as of 10/11/2020.    Reviewed chart prior to disease state call. Spoke with patient regarding BP  Recent Office Vitals: BP Readings from Last 3 Encounters:  09/02/20 (!) 160/70  07/19/20 136/71  06/17/20 134/72   Pulse Readings from Last 3 Encounters:  09/02/20 76  08/19/20 68  07/19/20 88    Wt Readings from Last 3 Encounters:  09/02/20 186 lb 9.6 oz (84.6 kg)  07/19/20 184 lb 6.4 oz (83.6 kg)  06/17/20 180 lb 12.8 oz (82 kg)     Kidney Function Lab Results  Component Value Date/Time   CREATININE 2.97 (H) 07/19/2020 02:01 PM   CREATININE 2.45 (H) 04/07/2020 06:39 PM   CREATININE 2.51 (H) 01/10/2020 03:51 PM   CREATININE 2.04 (H) 06/10/2016 11:14 AM   GFR 26.76 (L) 09/12/2019 11:38 AM   GFRNONAA 18 (L) 07/19/2020 02:01 PM   GFRAA 21 (L) 07/19/2020 02:01 PM    BMP Latest Ref Rng & Units 07/19/2020 04/07/2020 01/10/2020  Glucose 65 - 99 mg/dL 115(H) 107(H) 100(H)  BUN 7 - 25 mg/dL 56(H) 52(H) 46(H)  Creatinine 0.70 - 1.11 mg/dL 2.97(H) 2.45(H) 2.51(H)  BUN/Creat Ratio 6 - 22 (calc)  19 - 18  Sodium 135 - 146 mmol/L 141 140 142  Potassium 3.5 - 5.3 mmol/L 4.9 4.9 4.7  Chloride 98 - 110 mmol/L 111(H) 112(H) 109(H)  CO2 20 - 32 mmol/L 22 18(L) 21  Calcium 8.6 - 10.3 mg/dL 8.3(L) 8.5(L) 8.5(L)     Current antihypertensive regimen:  o Diltiazem 120 mg capsule daily o Imdur 60 mg capsule once daily o Furosemide 40 mg tablet daily   How often are you checking your Blood Pressure? when feeling symptomatic    Current home BP readings: 140/80   What recent interventions/DTPs have been made by any provider to improve Blood Pressure control since last CPP Visit: none noted, patient states he currently takes his  medications as instructed.   Any recent hospitalizations or ED visits since last visit with CPP? No , patient has not had any recent hospitalizations or ED visits since his last visit with [name], CPP.   What diet changes have been made to improve Blood Pressure Control?  o Patient states he has not made any diet changes. Patient states he does not add any salt to his foods.   What exercise is being done to improve your Blood Pressure Control?  o Patient states he does not exercise regularly. He states he has a "new step machine" that he will get on when he feels like it for about 20 minutes.  Adherence Review: Is the patient currently on ACE/ARB medication? No Does the patient have >5 day gap between last estimated fill dates? No   Patient states he has not yet had a 3rd covid vaccine booster dose. He states he plans to do so by the end of March.   Patient was asked if he has missed any doses of his medication Eliquis. Patient stated "maybe 1 or so, nothing to worry about".  Future Appointments  Date Time Provider [place]  12/11/2020 10:20 AM [name], MD [facility]  01/28/2021  4:00 PM [name], MD [facility]  04/15/2021 10:30 AM [facility] PHARMACIST [facility]    April D Calhoun, [place] Pharmacist Assistant [phone]

## 2020-10-24 DIAGNOSIS — Z23 Encounter for immunization: Secondary | ICD-10-CM | POA: Diagnosis not present

## 2020-11-06 DIAGNOSIS — Z85828 Personal history of other malignant neoplasm of skin: Secondary | ICD-10-CM | POA: Diagnosis not present

## 2020-11-06 DIAGNOSIS — L565 Disseminated superficial actinic porokeratosis (DSAP): Secondary | ICD-10-CM | POA: Diagnosis not present

## 2020-11-06 DIAGNOSIS — D2371 Other benign neoplasm of skin of right lower limb, including hip: Secondary | ICD-10-CM | POA: Diagnosis not present

## 2020-11-06 DIAGNOSIS — D692 Other nonthrombocytopenic purpura: Secondary | ICD-10-CM | POA: Diagnosis not present

## 2020-11-06 DIAGNOSIS — L570 Actinic keratosis: Secondary | ICD-10-CM | POA: Diagnosis not present

## 2020-11-06 DIAGNOSIS — L821 Other seborrheic keratosis: Secondary | ICD-10-CM | POA: Diagnosis not present

## 2020-11-07 DIAGNOSIS — C61 Malignant neoplasm of prostate: Secondary | ICD-10-CM | POA: Diagnosis not present

## 2020-11-07 DIAGNOSIS — R310 Gross hematuria: Secondary | ICD-10-CM | POA: Diagnosis not present

## 2020-11-15 DIAGNOSIS — D509 Iron deficiency anemia, unspecified: Secondary | ICD-10-CM | POA: Diagnosis not present

## 2020-11-15 DIAGNOSIS — Z6828 Body mass index (BMI) 28.0-28.9, adult: Secondary | ICD-10-CM | POA: Diagnosis not present

## 2020-11-15 DIAGNOSIS — R310 Gross hematuria: Secondary | ICD-10-CM | POA: Diagnosis not present

## 2020-11-15 DIAGNOSIS — N183 Chronic kidney disease, stage 3 unspecified: Secondary | ICD-10-CM | POA: Diagnosis not present

## 2020-11-15 DIAGNOSIS — D631 Anemia in chronic kidney disease: Secondary | ICD-10-CM | POA: Diagnosis not present

## 2020-11-15 LAB — BASIC METABOLIC PANEL
BUN: 49 — AB (ref 4–21)
Chloride: 109 — AB (ref 99–108)
Creatinine: 2.8 — AB (ref 0.6–1.3)
Glucose: 96
Potassium: 4.7 (ref 3.4–5.3)
Sodium: 142 (ref 137–147)

## 2020-11-15 LAB — IRON,TIBC AND FERRITIN PANEL
Ferritin: 106
Iron: 96
TIBC: 213
UIBC: 117

## 2020-11-15 LAB — COMPREHENSIVE METABOLIC PANEL
Albumin: 3.6 (ref 3.5–5.0)
Calcium: 8.7 (ref 8.7–10.7)
GFR calc Af Amer: 21
GFR calc non Af Amer: 21

## 2020-11-15 LAB — CBC AND DIFFERENTIAL: Hemoglobin: 10.7 — AB (ref 13.5–17.5)

## 2020-11-21 ENCOUNTER — Ambulatory Visit (INDEPENDENT_AMBULATORY_CARE_PROVIDER_SITE_OTHER): Payer: Medicare Other

## 2020-11-21 ENCOUNTER — Other Ambulatory Visit: Payer: Self-pay

## 2020-11-21 DIAGNOSIS — Z0000 Encounter for general adult medical examination without abnormal findings: Secondary | ICD-10-CM | POA: Diagnosis not present

## 2020-11-21 DIAGNOSIS — E538 Deficiency of other specified B group vitamins: Secondary | ICD-10-CM | POA: Diagnosis not present

## 2020-11-21 MED ORDER — CYANOCOBALAMIN 1000 MCG/ML IJ SOLN
1000.0000 ug | Freq: Once | INTRAMUSCULAR | Status: AC
  Administered 2020-11-21: 1000 ug via INTRAMUSCULAR

## 2020-11-21 NOTE — Patient Instructions (Signed)
Mark Jackson , Thank you for taking time to come for your Medicare Wellness Visit. I appreciate your ongoing commitment to your health goals. Please review the following plan we discussed and let me know if I can assist you in the future.   Screening recommendations/referrals: Colonoscopy: No longer required  Recommended yearly ophthalmology/optometry visit for glaucoma screening and checkup Recommended yearly dental visit for hygiene and checkup  Vaccinations: Influenza vaccine: Up to date Pneumococcal vaccine: Up to date Tdap vaccine: Up to date Shingles vaccine: Completed 7/5 & 05/17/18   Covid-19: Completed 8/5 & 04/11/20  Advanced directives: Declined copies   Conditions/risks identified: None at this time  Next appointment: Follow up in one year for your annual wellness visit.   Preventive Care 3 Years and Older, Male Preventive care refers to lifestyle choices and visits with your health care provider that can promote health and wellness. What does preventive care include?  A yearly physical exam. This is also called an annual well check.  Dental exams once or twice a year.  Routine eye exams. Ask your health care provider how often you should have your eyes checked.  Personal lifestyle choices, including:  Daily care of your teeth and gums.  Regular physical activity.  Eating a healthy diet.  Avoiding tobacco and drug use.  Limiting alcohol use.  Practicing safe sex.  Taking low doses of aspirin every day.  Taking vitamin and mineral supplements as recommended by your health care provider. What happens during an annual well check? The services and screenings done by your health care provider during your annual well check will depend on your age, overall health, lifestyle risk factors, and family history of disease. Counseling  Your health care provider may ask you questions about your:  Alcohol use.  Tobacco use.  Drug use.  Emotional well-being.  Home  and relationship well-being.  Sexual activity.  Eating habits.  History of falls.  Memory and ability to understand (cognition).  Work and work Statistician. Screening  You may have the following tests or measurements:  Height, weight, and BMI.  Blood pressure.  Lipid and cholesterol levels. These may be checked every 5 years, or more frequently if you are over 43 years old.  Skin check.  Lung cancer screening. You may have this screening every year starting at age 53 if you have a 30-pack-year history of smoking and currently smoke or have quit within the past 15 years.  Fecal occult blood test (FOBT) of the stool. You may have this test every year starting at age 40.  Flexible sigmoidoscopy or colonoscopy. You may have a sigmoidoscopy every 5 years or a colonoscopy every 10 years starting at age 17.  Prostate cancer screening. Recommendations will vary depending on your family history and other risks.  Hepatitis C blood test.  Hepatitis B blood test.  Sexually transmitted disease (STD) testing.  Diabetes screening. This is done by checking your blood sugar (glucose) after you have not eaten for a while (fasting). You may have this done every 1-3 years.  Abdominal aortic aneurysm (AAA) screening. You may need this if you are a current or former smoker.  Osteoporosis. You may be screened starting at age 83 if you are at high risk. Talk with your health care provider about your test results, treatment options, and if necessary, the need for more tests. Vaccines  Your health care provider may recommend certain vaccines, such as:  Influenza vaccine. This is recommended every year.  Tetanus, diphtheria, and  acellular pertussis (Tdap, Td) vaccine. You may need a Td booster every 10 years.  Zoster vaccine. You may need this after age 58.  Pneumococcal 13-valent conjugate (PCV13) vaccine. One dose is recommended after age 68.  Pneumococcal polysaccharide (PPSV23) vaccine.  One dose is recommended after age 83. Talk to your health care provider about which screenings and vaccines you need and how often you need them. This information is not intended to replace advice given to you by your health care provider. Make sure you discuss any questions you have with your health care provider. Document Released: 08/23/2015 Document Revised: 04/15/2016 Document Reviewed: 05/28/2015 Elsevier Interactive Patient Education  2017 [place] Prevention in the Home Falls can cause injuries. They can happen to people of all ages. There are many things you can do to make your home safe and to help prevent falls. What can I do on the outside of my home?  Regularly fix the edges of walkways and driveways and fix any cracks.  Remove anything that might make you trip as you walk through a door, such as a raised step or threshold.  Trim any bushes or trees on the path to your home.  Use bright outdoor lighting.  Clear any walking paths of anything that might make someone trip, such as rocks or tools.  Regularly check to see if handrails are loose or broken. Make sure that both sides of any steps have handrails.  Any raised decks and porches should have guardrails on the edges.  Have any leaves, snow, or ice cleared regularly.  Use sand or salt on walking paths during winter.  Clean up any spills in your garage right away. This includes oil or grease spills. What can I do in the bathroom?  Use night lights.  Install grab bars by the toilet and in the tub and shower. Do not use towel bars as grab bars.  Use non-skid mats or decals in the tub or shower.  If you need to sit down in the shower, use a plastic, non-slip stool.  Keep the floor dry. Clean up any water that spills on the floor as soon as it happens.  Remove soap buildup in the tub or shower regularly.  Attach bath mats securely with double-sided non-slip rug tape.  Do not have throw rugs and other  things on the floor that can make you trip. What can I do in the bedroom?  Use night lights.  Make sure that you have a light by your bed that is easy to reach.  Do not use any sheets or blankets that are too big for your bed. They should not hang down onto the floor.  Have a firm chair that has side arms. You can use this for support while you get dressed.  Do not have throw rugs and other things on the floor that can make you trip. What can I do in the kitchen?  Clean up any spills right away.  Avoid walking on wet floors.  Keep items that you use a lot in easy-to-reach places.  If you need to reach something above you, use a strong step stool that has a grab bar.  Keep electrical cords out of the way.  Do not use floor polish or wax that makes floors slippery. If you must use wax, use non-skid floor wax.  Do not have throw rugs and other things on the floor that can make you trip. What can I do with my stairs?  Do not leave any items on the stairs.  Make sure that there are handrails on both sides of the stairs and use them. Fix handrails that are broken or loose. Make sure that handrails are as long as the stairways.  Check any carpeting to make sure that it is firmly attached to the stairs. Fix any carpet that is loose or worn.  Avoid having throw rugs at the top or bottom of the stairs. If you do have throw rugs, attach them to the floor with carpet tape.  Make sure that you have a light switch at the top of the stairs and the bottom of the stairs. If you do not have them, ask someone to add them for you. What else can I do to help prevent falls?  Wear shoes that:  Do not have high heels.  Have rubber bottoms.  Are comfortable and fit you well.  Are closed at the toe. Do not wear sandals.  If you use a stepladder:  Make sure that it is fully opened. Do not climb a closed stepladder.  Make sure that both sides of the stepladder are locked into place.  Ask  someone to hold it for you, if possible.  Clearly mark and make sure that you can see:  Any grab bars or handrails.  First and last steps.  Where the edge of each step is.  Use tools that help you move around (mobility aids) if they are needed. These include:  Canes.  Walkers.  Scooters.  Crutches.  Turn on the lights when you go into a dark area. Replace any light bulbs as soon as they burn out.  Set up your furniture so you have a clear path. Avoid moving your furniture around.  If any of your floors are uneven, fix them.  If there are any pets around you, be aware of where they are.  Review your medicines with your doctor. Some medicines can make you feel dizzy. This can increase your chance of falling. Ask your doctor what other things that you can do to help prevent falls. This information is not intended to replace advice given to you by your health care provider. Make sure you discuss any questions you have with your health care provider. Document Released: 05/23/2009 Document Revised: 01/02/2016 Document Reviewed: 08/31/2014 Elsevier Interactive Patient Education  2017 [employer].

## 2020-11-21 NOTE — Progress Notes (Signed)
Per orders of Dr. Hunter, injection of B-12 given by Magdalen Cabana D Hillary Schwegler in left deltoid. Patient tolerated injection well. Patient will make appointment for 1 month.  
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
## 2020-11-23 ENCOUNTER — Other Ambulatory Visit: Payer: Self-pay | Admitting: Family Medicine

## 2020-11-23 DIAGNOSIS — G43009 Migraine without aura, not intractable, without status migrainosus: Secondary | ICD-10-CM

## 2020-11-25 ENCOUNTER — Encounter: Payer: Self-pay | Admitting: Family Medicine

## 2020-11-27 DIAGNOSIS — K449 Diaphragmatic hernia without obstruction or gangrene: Secondary | ICD-10-CM | POA: Diagnosis not present

## 2020-11-27 DIAGNOSIS — K5730 Diverticulosis of large intestine without perforation or abscess without bleeding: Secondary | ICD-10-CM | POA: Diagnosis not present

## 2020-11-27 DIAGNOSIS — R310 Gross hematuria: Secondary | ICD-10-CM | POA: Diagnosis not present

## 2020-11-27 DIAGNOSIS — Z8546 Personal history of malignant neoplasm of prostate: Secondary | ICD-10-CM | POA: Diagnosis not present

## 2020-12-01 ENCOUNTER — Other Ambulatory Visit: Payer: Self-pay

## 2020-12-01 ENCOUNTER — Emergency Department (HOSPITAL_BASED_OUTPATIENT_CLINIC_OR_DEPARTMENT_OTHER)
Admission: EM | Admit: 2020-12-01 | Discharge: 2020-12-01 | Disposition: A | Discharge status: Home / Self Care | Payer: Medicare Other | Attending: Emergency Medicine | Admitting: Emergency Medicine

## 2020-12-01 ENCOUNTER — Encounter (HOSPITAL_BASED_OUTPATIENT_CLINIC_OR_DEPARTMENT_OTHER): Payer: Self-pay

## 2020-12-01 DIAGNOSIS — I130 Hypertensive heart and chronic kidney disease with heart failure and stage 1 through stage 4 chronic kidney disease, or unspecified chronic kidney disease: Secondary | ICD-10-CM | POA: Diagnosis not present

## 2020-12-01 DIAGNOSIS — I5032 Chronic diastolic (congestive) heart failure: Secondary | ICD-10-CM | POA: Diagnosis not present

## 2020-12-01 DIAGNOSIS — Z85828 Personal history of other malignant neoplasm of skin: Secondary | ICD-10-CM | POA: Diagnosis not present

## 2020-12-01 DIAGNOSIS — Z87891 Personal history of nicotine dependence: Secondary | ICD-10-CM | POA: Insufficient documentation

## 2020-12-01 DIAGNOSIS — Z96651 Presence of right artificial knee joint: Secondary | ICD-10-CM | POA: Insufficient documentation

## 2020-12-01 DIAGNOSIS — N184 Chronic kidney disease, stage 4 (severe): Secondary | ICD-10-CM | POA: Insufficient documentation

## 2020-12-01 DIAGNOSIS — Z8546 Personal history of malignant neoplasm of prostate: Secondary | ICD-10-CM | POA: Diagnosis not present

## 2020-12-01 DIAGNOSIS — Z79899 Other long term (current) drug therapy: Secondary | ICD-10-CM | POA: Insufficient documentation

## 2020-12-01 DIAGNOSIS — I2510 Atherosclerotic heart disease of native coronary artery without angina pectoris: Secondary | ICD-10-CM | POA: Insufficient documentation

## 2020-12-01 DIAGNOSIS — R319 Hematuria, unspecified: Secondary | ICD-10-CM | POA: Insufficient documentation

## 2020-12-01 MED ORDER — CEPHALEXIN 500 MG PO CAPS: 500.0000 mg | ORAL_CAPSULE | Freq: Two times a day (BID) | ORAL | 0 refills | Status: AC

## 2020-12-01 NOTE — ED Triage Notes (Addendum)
Blood in urine starting yesterday at 1600, had a previous similar episode in late March per patient.  Patient takes Eliquis and brought a cup with a sample of bloody urine in it.
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
## 2020-12-04 ENCOUNTER — Other Ambulatory Visit: Payer: Self-pay

## 2020-12-04 ENCOUNTER — Ambulatory Visit (HOSPITAL_COMMUNITY): Payer: Medicare Other | Attending: Internal Medicine

## 2020-12-04 DIAGNOSIS — I5032 Chronic diastolic (congestive) heart failure: Secondary | ICD-10-CM | POA: Insufficient documentation

## 2020-12-04 DIAGNOSIS — Z952 Presence of prosthetic heart valve: Secondary | ICD-10-CM | POA: Diagnosis not present

## 2020-12-04 HISTORY — PX: TRANSTHORACIC ECHOCARDIOGRAM: SHX275

## 2020-12-04 LAB — ECHOCARDIOGRAM COMPLETE
AR max vel: 1.03 cm2
AV Area VTI: 1.15 cm2
AV Area mean vel: 1.01 cm2
AV Mean grad: 26 mmHg
AV Peak grad: 43.3 mmHg
Ao pk vel: 3.29 m/s
Area-P 1/2: 2.02 cm2
S' Lateral: 3.4 cm

## 2020-12-06 ENCOUNTER — Other Ambulatory Visit: Payer: Self-pay | Admitting: Physician Assistant

## 2020-12-06 DIAGNOSIS — Z952 Presence of prosthetic heart valve: Secondary | ICD-10-CM

## 2020-12-11 ENCOUNTER — Other Ambulatory Visit: Payer: Self-pay

## 2020-12-11 ENCOUNTER — Encounter: Payer: Self-pay | Admitting: Cardiology

## 2020-12-11 ENCOUNTER — Ambulatory Visit (INDEPENDENT_AMBULATORY_CARE_PROVIDER_SITE_OTHER): Payer: Medicare Other | Admitting: Cardiology

## 2020-12-11 VITALS — BP 156/80 | HR 76 | Ht 68.0 in | Wt 188.8 lb

## 2020-12-11 DIAGNOSIS — D649 Anemia, unspecified: Secondary | ICD-10-CM

## 2020-12-11 DIAGNOSIS — R5382 Chronic fatigue, unspecified: Secondary | ICD-10-CM | POA: Diagnosis not present

## 2020-12-11 DIAGNOSIS — N184 Chronic kidney disease, stage 4 (severe): Secondary | ICD-10-CM | POA: Diagnosis not present

## 2020-12-11 DIAGNOSIS — I5032 Chronic diastolic (congestive) heart failure: Secondary | ICD-10-CM

## 2020-12-11 DIAGNOSIS — Z7901 Long term (current) use of anticoagulants: Secondary | ICD-10-CM

## 2020-12-11 DIAGNOSIS — Z952 Presence of prosthetic heart valve: Secondary | ICD-10-CM

## 2020-12-11 DIAGNOSIS — R600 Localized edema: Secondary | ICD-10-CM

## 2020-12-11 DIAGNOSIS — R55 Syncope and collapse: Secondary | ICD-10-CM

## 2020-12-11 DIAGNOSIS — I480 Paroxysmal atrial fibrillation: Secondary | ICD-10-CM | POA: Diagnosis not present

## 2020-12-11 DIAGNOSIS — E785 Hyperlipidemia, unspecified: Secondary | ICD-10-CM | POA: Diagnosis not present

## 2020-12-11 DIAGNOSIS — I25119 Atherosclerotic heart disease of native coronary artery with unspecified angina pectoris: Secondary | ICD-10-CM

## 2020-12-11 DIAGNOSIS — R319 Hematuria, unspecified: Secondary | ICD-10-CM

## 2020-12-11 NOTE — Progress Notes (Signed)
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 am leery of stopping the diltiazem altogether because of his history of A. fib and underlying hypertension.  However low threshold to consider stopping it..  Going to check TSH and CBC to see if this will help explain some of the fatigue issues.  I do not think his syncopal episodes are related to any arrhythmias or his progression aortic disease.  It is not significant enough to potentially consider that syncope, unless he is volume depleted.  We want him to wear compression stockings.  He is can have his daughter take him down to to purchase compression stockings today.      S/P TAVR (transcatheter aortic valve replacement) (Chronic)    Unfortunately, his most recent echo shows progression of aortic stenosis mean gradient of 26 mmHg from 13 mmHg in June of last year.  This could represent valve dysfunction versus potential thrombus on the valve.  As such, the plan is to restart Eliquis, and then check an echocardiogram again in July.  Thankfully, EF is stable.         A total of 36 minutes was spent  with the patient in direct patient consultation.  Additional 25 minutes spent charting.  Total time 51 minutes.  Current medicines are reviewed at length with the patient today.  (+/- concerns) not sure when he should restart Eliquis.  This visit occurred during the SARS-CoV-2 public health emergency.  Safety protocols were in place, including screening questions prior to the visit, additional usage of staff PPE, and extensive cleaning of exam room while observing appropriate contact time as indicated for disinfecting solutions.  Notice: This dictation was prepared with Dragon dictation along with smaller phrase technology. Any transcriptional errors that result from this process are unintentional and may not be corrected upon review.  Patient Instructions / Medication Changes & Studies & Tests Ordered   Patient Instructions  Medication Instructions:   Restart taking Eliquis  NOW - if bleeding require  Contact urology  Sooner  Hold  this medication until you fell better  Rosuvastatin   decrease Imdur to 1/2 tablet of 60 mg ( 30 mg)    once you purchase and start wearing Compression socks - decrease Lasix ( furosemide) to every other day   *If you need a refill on your cardiac medications before your next appointment, please call your pharmacy*   Lab Work: CBC TSH If you have labs (blood work) drawn today and your tests are completely normal, you will receive your results only by: Marland Kitchen MyChart Message (if you have MyChart) OR . A paper copy in the mail If you have any lab test that is abnormal or we need to change your treatment, we will call you to review the results.   Testing/Procedures:    Follow-Up: At [name], you and your health needs are our priority.  As part of our continuing mission to provide you with exceptional heart care, we have created designated Provider Care Teams.  These Care Teams include your primary Cardiologist (physician) and Advanced Practice  Providers (APPs -  Physician Assistants and Nurse Practitioners) who all work together to provide you with the care you need, when you need it.     Your next appointment:       2 TO 3       month(s) AFTER ECHO IN JULY   The format for your next appointment:   In Person  Provider:   [name], MD   Other Instructions   recommends you purchase some compression  socks/hose from [employer] in [place] ,[place]. You do not need an prescription to purchase the items.  Address  [address] [place], [place] [address]  Phone  [phone]   Compression   strength   x 8-15 mmHg X 15-20 mmHg                           20-30 mmHg  30-40 mmHg.  You may also try a medical supply store, department store (i.e.- [place], [employer], [employer], specialty shoe stores ( shoe market), [employer] and [employer]) or  Chartered loss adjuster uniform store or online .    Studies Ordered:   Orders Placed This Encounter  Procedures  . CBC  . TSH     [name], M.D., M.S. Interventional Cardiologist   Pager # [phone] Phone # [phone] [address] [place], [place] [address]   Thank you for choosing Heartcare at [facility]!!

## 2020-12-11 NOTE — Patient Instructions (Addendum)
Medication Instructions:   Restart taking Eliquis  NOW - if bleeding require  Contact urology  Sooner  Hold this medication until you fell better  Rosuvastatin   decrease Imdur to 1/2 tablet of 60 mg ( 30 mg)    once you purchase and start wearing Compression socks - decrease Lasix ( furosemide) to every other day   *If you need a refill on your cardiac medications before your next appointment, please call your pharmacy*   Lab Work: CBC TSH If you have labs (blood work) drawn today and your tests are completely normal, you will receive your results only by: Marland Kitchen MyChart Message (if you have MyChart) OR . A paper copy in the mail If you have any lab test that is abnormal or we need to change your treatment, we will call you to review the results.   Testing/Procedures:    Follow-Up: At [facility], you and your health needs are our priority.  As part of our continuing mission to provide you with exceptional heart care, we have created designated Provider Care Teams.  These Care Teams include your primary Cardiologist (physician) and Advanced Practice Providers (APPs -  Physician Assistants and Nurse Practitioners) who all work together to provide you with the care you need, when you need it.     Your next appointment:       2 TO 3       month(s) AFTER ECHO IN JULY   The format for your next appointment:   In Person  Provider:   [name], MD   Other Instructions   recommends you purchase some compression  socks/hose from [facility] [facility] in [place] ,[place]. You do not need an prescription to purchase the items.  Address  [address] [place], [place] [address]  Phone  [phone]   Compression   strength   x 8-15 mmHg X 15-20 mmHg                           20-30 mmHg  30-40 mmHg.  You may also try a medical supply store, department store (i.e.- [place], [employer], [employer], specialty shoe stores ( shoe market), [employer] and [employer]) or  Chartered loss adjuster  uniform store or online .

## 2020-12-12 LAB — CBC
Hematocrit: 29.2 % — ABNORMAL LOW (ref 37.5–51.0)
Hemoglobin: 9.8 g/dL — ABNORMAL LOW (ref 13.0–17.7)
MCH: 33.1 pg — ABNORMAL HIGH (ref 26.6–33.0)
MCHC: 33.6 g/dL (ref 31.5–35.7)
MCV: 99 fL — ABNORMAL HIGH (ref 79–97)
Platelets: 122 10*3/uL — ABNORMAL LOW (ref 150–450)
RBC: 2.96 x10E6/uL — ABNORMAL LOW (ref 4.14–5.80)
RDW: 12.5 % (ref 11.6–15.4)
WBC: 11.9 10*3/uL — ABNORMAL HIGH (ref 3.4–10.8)

## 2020-12-12 LAB — TSH: TSH: 3.97 u[IU]/mL (ref 0.450–4.500)

## 2020-12-16 ENCOUNTER — Telehealth: Payer: Self-pay | Admitting: *Deleted

## 2020-12-16 ENCOUNTER — Other Ambulatory Visit: Payer: Self-pay

## 2020-12-16 DIAGNOSIS — R319 Hematuria, unspecified: Secondary | ICD-10-CM

## 2020-12-16 DIAGNOSIS — R0602 Shortness of breath: Secondary | ICD-10-CM

## 2020-12-16 DIAGNOSIS — D649 Anemia, unspecified: Secondary | ICD-10-CM

## 2020-12-16 DIAGNOSIS — R5383 Other fatigue: Secondary | ICD-10-CM

## 2020-12-16 NOTE — Telephone Encounter (Signed)
-----   Message from [name], MD sent at 12/14/2020  2:16 PM EDT ----- Blood count shows that you are a little anemic side.  Hemoglobin is down to 9.8 which goes along with having lost some blood in your urine.  White blood cell count is up a little bit as well which could be consistent with an underlying infection. Thyroid levels are normal.  I will forward these labs to Dr. [name] -it looks like you are already on oral iron supplementation.  Levels are quite low enough to warrant IV iron, but you should probably continue to follow-up-due to stay low, may consider IV iron infusion.  For further evaluation, I would defer to Dr. [name], but would like to order a urinalysis and 2 view chest x-ray to rule out pneumonia and urinary tract infection.  [name]

## 2020-12-16 NOTE — Telephone Encounter (Signed)
Patient is aware <Dr Ellyn Hack would like for him to have chest xray and urinalysis done.  patient is aware of both locations and information is in both sytem's for paper work is needed.  Patient verbalized understanding and state he will do both tomorrow after his meeting

## 2020-12-17 ENCOUNTER — Ambulatory Visit
Admission: RE | Admit: 2020-12-17 | Discharge: 2020-12-17 | Disposition: A | Discharge status: Home / Self Care | Payer: Medicare Other | Source: Ambulatory Visit | Attending: Cardiology | Admitting: Cardiology

## 2020-12-17 DIAGNOSIS — D649 Anemia, unspecified: Secondary | ICD-10-CM

## 2020-12-17 DIAGNOSIS — R319 Hematuria, unspecified: Secondary | ICD-10-CM | POA: Diagnosis not present

## 2020-12-17 DIAGNOSIS — R0602 Shortness of breath: Secondary | ICD-10-CM | POA: Diagnosis not present

## 2020-12-17 DIAGNOSIS — R5383 Other fatigue: Secondary | ICD-10-CM | POA: Diagnosis not present

## 2020-12-18 LAB — URINALYSIS
Bilirubin, UA: NEGATIVE
Glucose, UA: NEGATIVE
Ketones, UA: NEGATIVE
Leukocytes,UA: NEGATIVE
Nitrite, UA: NEGATIVE
Protein,UA: NEGATIVE
RBC, UA: NEGATIVE
Specific Gravity, UA: 1.011 (ref 1.005–1.030)
Urobilinogen, Ur: 0.2 mg/dL (ref 0.2–1.0)
pH, UA: 5.5 (ref 5.0–7.5)

## 2020-12-19 ENCOUNTER — Ambulatory Visit (INDEPENDENT_AMBULATORY_CARE_PROVIDER_SITE_OTHER): Payer: Medicare Other | Admitting: *Deleted

## 2020-12-19 ENCOUNTER — Other Ambulatory Visit (INDEPENDENT_AMBULATORY_CARE_PROVIDER_SITE_OTHER): Payer: Medicare Other

## 2020-12-19 ENCOUNTER — Other Ambulatory Visit: Payer: Self-pay

## 2020-12-19 DIAGNOSIS — E538 Deficiency of other specified B group vitamins: Secondary | ICD-10-CM

## 2020-12-19 DIAGNOSIS — D649 Anemia, unspecified: Secondary | ICD-10-CM

## 2020-12-19 LAB — CBC WITH DIFFERENTIAL/PLATELET
Basophils Absolute: 0.1 10*3/uL (ref 0.0–0.1)
Basophils Relative: 0.7 % (ref 0.0–3.0)
Eosinophils Absolute: 0.3 10*3/uL (ref 0.0–0.7)
Eosinophils Relative: 3.2 % (ref 0.0–5.0)
HCT: 29.5 % — ABNORMAL LOW (ref 39.0–52.0)
Hemoglobin: 9.9 g/dL — ABNORMAL LOW (ref 13.0–17.0)
Lymphocytes Relative: 12.9 % (ref 12.0–46.0)
Lymphs Abs: 1.2 10*3/uL (ref 0.7–4.0)
MCHC: 33.6 g/dL (ref 30.0–36.0)
MCV: 100.9 fl — ABNORMAL HIGH (ref 78.0–100.0)
Monocytes Absolute: 0.9 10*3/uL (ref 0.1–1.0)
Monocytes Relative: 9.7 % (ref 3.0–12.0)
Neutro Abs: 7 10*3/uL (ref 1.4–7.7)
Neutrophils Relative %: 73.5 % (ref 43.0–77.0)
Platelets: 97 10*3/uL — ABNORMAL LOW (ref 150.0–400.0)
RBC: 2.92 Mil/uL — ABNORMAL LOW (ref 4.22–5.81)
RDW: 14 % (ref 11.5–15.5)
WBC: 9.5 10*3/uL (ref 4.0–10.5)

## 2020-12-19 LAB — FERRITIN: Ferritin: 45.1 ng/mL (ref 22.0–322.0)

## 2020-12-19 MED ORDER — CYANOCOBALAMIN 1000 MCG/ML IJ SOLN
1000.0000 ug | Freq: Once | INTRAMUSCULAR | Status: AC
  Administered 2020-12-19: 1000 ug via INTRAMUSCULAR

## 2020-12-19 NOTE — Progress Notes (Signed)
Patient presents for B12 injection today. Patient received her B12 injection in Right Deltoid. Patient tolerated injection well.  Documentation entered in [facility] in [place].

## 2020-12-22 ENCOUNTER — Encounter: Payer: Self-pay | Admitting: Cardiology

## 2020-12-22 NOTE — Assessment & Plan Note (Addendum)
Patent stents in September 2020.  He has overlapping stents in the LAD.  No further use of nitroglycerin.  No longer on aspirin or Plavix because of being on Eliquis.  We are trying to allow for permissive hypertension.  Did not tolerate beta-blocker because of fatigue.  Now his fatigue is even worse.  Plan: We have reduced diltiazem to 120 mg daily, with no further symptoms of chest pain, will reduce Imdur to 30 mg daily and reassess for recurrence of symptoms.  He has been on statin, but with his extreme fatigue, will discontinue for now to avoid any potential complications.

## 2020-12-22 NOTE — Assessment & Plan Note (Signed)
He has preserved EF with diastolic dysfunction.  Grade 1 diastolic function on most recent echo.  I do not really think that he is having true CHF symptoms.  Not having PND orthopnea.  His chronic edema is probably related to venous stasis.  NYHA class I-II symptoms.  He is actually only taking his furosemide half tablet daily. Major issue now is fatigue.  To avoid excess dehydration given his orthostatic hypotension reduces furosemide every other day with the ability to take a full dose if necessary for worsening edema.  Not on ARB or beta-blocker because of concerns about fatigue and orthostatic hypotension.  He is only on low-dose diltiazem.

## 2020-12-22 NOTE — Assessment & Plan Note (Signed)
Unfortunately, his most recent echo shows progression of aortic stenosis mean gradient of 26 mmHg from 13 mmHg in June of last year.  This could represent valve dysfunction versus potential thrombus on the valve.  As such, the plan is to restart Eliquis, and then check an echocardiogram again in July.  Thankfully, EF is stable.

## 2020-12-22 NOTE — Assessment & Plan Note (Signed)
Evaluate fatigue.  I think a lot of this may be potentially related to anemia, but also potentially failure to thrive.  I am concerned that he has had several setbacks with his TAVR being just one of the events.  He was doing so well back in November, but has consistent setbacks.  Although he has not lost weight, I am concerned that he is showing signs of decline.  He is not resting well which is not helping. I think a lot of his falling spells are related to fatigue as well.  For the most part recently sleepy to fatigue.  This includes having reduced his diltiazem dose to 120 mg; reducing Imdur to 30 mg and holding statin.

## 2020-12-22 NOTE — Assessment & Plan Note (Signed)
Currently has been holding Eliquis because of hematuria.  Possibly related to UTI, however with recurrent hematuria, I think he definitely needs urology evaluation.  Says he has not had any recent hematuria, plan will be to restart Eliquis pending CBC evaluation.  Okay to hold Eliquis for procedures, surgeries or recurrent bleeding.

## 2020-12-22 NOTE — Assessment & Plan Note (Signed)
A moderate anemia is most likely due to his fatigue.  His syncopal episodes seem to be related to loss of balance and weakness.  TAVR With discuss importance of adequate nutrition and adequate hydration.  He needs to cut down on sweet tea he is drinking increased fluid intake.  I am further reducing his potential antihypertensives by decreasing Imdur to 30 mg.  I am leery of stopping the diltiazem altogether because of his history of A. fib and underlying hypertension.  However low threshold to consider stopping it..  Going to check TSH and CBC to see if this will help explain some of the fatigue issues.  I do not think his syncopal episodes are related to any arrhythmias or his progression aortic disease.  It is not significant enough to potentially consider that syncope, unless he is volume depleted.  We want him to wear compression stockings.  He is can have his daughter take him down to to purchase compression stockings today.

## 2020-12-22 NOTE — Assessment & Plan Note (Signed)
He has had excellent lipid control.  However now having significant fatigue, and try to review any medications which could potentially exacerbate this.  Home rosuvastatin till seen in follow-up.

## 2020-12-22 NOTE — Assessment & Plan Note (Signed)
When possible continue support stockings.  Elevate feet.  Dorsiflexion plantarflexion exercising.  Every other day Lasix with PRN dosing.  No PND or orthopnea noted-would suggest this is more venous stasis related.

## 2020-12-22 NOTE — Assessment & Plan Note (Signed)
Now being followed by Dr. Moshe Cipro from nephrology.  Most recent creatinine is 2.8, but is still making urine without difficulty.  Now only having some dysuria and hematuria..  He has made his intentions clear that he would not do dialysis.  Continue to avoid hypotension.  Renal dosing of Eliquis.

## 2020-12-22 NOTE — Assessment & Plan Note (Addendum)
Seems to be relatively asymptomatic.  No breakthrough spells.  Is on diltiazem for rate control and has been on Eliquis for rhythm control.   Eliquis (at reduced dose based on renal insufficiency) has been on hold since his hematuria.  Plan was to restart after 10 days antibiotics.  I would like to assess his hemoglobin level and if stable if okay to restart Eliquis -> then recheck in 1 week.  With the desire to allow for permissive hypertension given his orthostatic hypotension and near syncope episodes, we recently reduced diltiazem to 120 mg daily.

## 2020-12-25 DIAGNOSIS — Z8546 Personal history of malignant neoplasm of prostate: Secondary | ICD-10-CM | POA: Diagnosis not present

## 2020-12-25 DIAGNOSIS — R310 Gross hematuria: Secondary | ICD-10-CM | POA: Diagnosis not present

## 2021-01-21 ENCOUNTER — Ambulatory Visit (INDEPENDENT_AMBULATORY_CARE_PROVIDER_SITE_OTHER): Payer: Medicare Other | Admitting: *Deleted

## 2021-01-21 ENCOUNTER — Other Ambulatory Visit: Payer: Self-pay

## 2021-01-21 DIAGNOSIS — E538 Deficiency of other specified B group vitamins: Secondary | ICD-10-CM | POA: Diagnosis not present

## 2021-01-21 MED ORDER — CYANOCOBALAMIN 1000 MCG/ML IJ SOLN
1000.0000 ug | Freq: Once | INTRAMUSCULAR | Status: AC
  Administered 2021-01-21: 1000 ug via INTRAMUSCULAR

## 2021-01-21 NOTE — Progress Notes (Signed)
I have reviewed the patient's encounter and agree with the documentation.  Mark Jackson. Jerline Pain, MD 01/21/2021 9:59 AM

## 2021-01-21 NOTE — Progress Notes (Signed)
Per orders of Dr. Parker, injection of Cyanocobalamin 1000 mcg/ml given IM by Mayelin Panos, LPN in left deltoid. Patient tolerated injection well. Patient will make appointment for 1 month.  

## 2021-01-22 ENCOUNTER — Other Ambulatory Visit: Payer: Self-pay | Admitting: Cardiology

## 2021-01-22 DIAGNOSIS — I2510 Atherosclerotic heart disease of native coronary artery without angina pectoris: Secondary | ICD-10-CM

## 2021-01-22 DIAGNOSIS — Z7901 Long term (current) use of anticoagulants: Secondary | ICD-10-CM

## 2021-01-22 NOTE — Telephone Encounter (Signed)
74m, 85.6kg, scr 2.8 11/15/20, lovw/harding 12/11/20

## 2021-01-28 ENCOUNTER — Encounter: Payer: Self-pay | Admitting: Family Medicine

## 2021-01-28 ENCOUNTER — Ambulatory Visit (INDEPENDENT_AMBULATORY_CARE_PROVIDER_SITE_OTHER): Payer: Medicare Other | Admitting: Family Medicine

## 2021-01-28 ENCOUNTER — Other Ambulatory Visit: Payer: Self-pay

## 2021-01-28 VITALS — BP 138/70 | HR 63 | Temp 98.5°F | Ht 68.0 in | Wt 192.6 lb

## 2021-01-28 DIAGNOSIS — I480 Paroxysmal atrial fibrillation: Secondary | ICD-10-CM

## 2021-01-28 DIAGNOSIS — G4452 New daily persistent headache (NDPH): Secondary | ICD-10-CM

## 2021-01-28 DIAGNOSIS — D696 Thrombocytopenia, unspecified: Secondary | ICD-10-CM

## 2021-01-28 DIAGNOSIS — E785 Hyperlipidemia, unspecified: Secondary | ICD-10-CM | POA: Diagnosis not present

## 2021-01-28 DIAGNOSIS — Z9861 Coronary angioplasty status: Secondary | ICD-10-CM

## 2021-01-28 DIAGNOSIS — D649 Anemia, unspecified: Secondary | ICD-10-CM

## 2021-01-28 DIAGNOSIS — I10 Essential (primary) hypertension: Secondary | ICD-10-CM

## 2021-01-28 DIAGNOSIS — I779 Disorder of arteries and arterioles, unspecified: Secondary | ICD-10-CM

## 2021-01-28 DIAGNOSIS — I2510 Atherosclerotic heart disease of native coronary artery without angina pectoris: Secondary | ICD-10-CM

## 2021-01-28 DIAGNOSIS — N184 Chronic kidney disease, stage 4 (severe): Secondary | ICD-10-CM | POA: Diagnosis not present

## 2021-01-28 MED ORDER — TOPIRAMATE 100 MG PO TABS
150.0000 mg | ORAL_TABLET | Freq: Every day | ORAL | 3 refills | Status: DC
Start: 2021-01-28 — End: 2021-03-26

## 2021-01-28 NOTE — Progress Notes (Signed)
Phone [phone] In person visit   Subjective:   Mark Jackson. is a 87 y.o. year old very pleasant male patient who presents for/with See problem oriented charting Chief Complaint  Patient presents with   Atrial Fibrillation   This visit occurred during the SARS-CoV-2 public health emergency.  Safety protocols were in place, including screening questions prior to the visit, additional usage of staff PPE, and extensive cleaning of exam room while observing appropriate contact time as indicated for disinfecting solutions.   Past Medical History-  Patient Active Problem List   Diagnosis Date Noted   Chronic diastolic heart failure ([place]) 12/12/2019    Priority: High   S/P TAVR (transcatheter aortic valve replacement) 12/12/2019    Priority: High   CAD S/P DES PCI to proximal LAD 03/22/2015    Priority: High   Paroxysmal atrial fibrillation ([place]); CHA2DSVasc - 4; Now on Eliquis 08/20/2014    Priority: High    Class: Diagnosis of   Chronic kidney disease (CKD), active medical management without dialysis, stage 4 (severe) ([place]) 08/20/2014    Priority: High   Personal history of prostate cancer 10/22/2008    Priority: High   Hyperglycemia 09/27/2017    Priority: Medium   B12 deficiency 01/06/2017    Priority: Medium   BPH associated with nocturia 06/15/2016    Priority: Medium   Hereditary and idiopathic peripheral neuropathy 01/12/2014    Priority: Medium   H/O syncope 09/03/2013    Priority: Medium   Right-sided carotid artery disease; followed by Dr. Trula Slade 03/02/2013    Priority: Medium   Hyperlipidemia with target LDL less than 70 03/02/2013    Priority: Medium   Migraine without aura 10/26/2012    Priority: Medium   Anemia 10/23/2008    Priority: Medium   Essential hypertension 10/22/2008    Priority: Medium   Sleep apnea 10/22/2008    Priority: Medium   Fatigue 11/08/2017    Priority: Low   Chronic diarrhea 01/06/2017    Priority: Low   Perianal  dermatitis 06/19/2015    Priority: Low   Rectal bleeding 04/25/2015    Priority: Low   Long term current use of anticoagulant therapy 08/27/2014    Priority: Low   Obesity (BMI 30-39.9) 09/03/2013    Priority: Low   GLAUCOMA 10/23/2008    Priority: Low   Arthropathy 10/22/2008    Priority: Low   History of colonic polyps 10/22/2008    Priority: Low   Hematuria 12/11/2020   Insomnia, psychophysiological 02/28/2020   Severe aortic stenosis 12/12/2019   Syncope and collapse 11/27/2019   Bilateral lower extremity edema 04/15/2017    Medications- reviewed and updated Current Outpatient Medications  Medication Sig Dispense Refill   acetaminophen (TYLENOL) 325 MG tablet Take 325-650 mg by mouth every 6 (six) hours as needed for moderate pain or headache.      amoxicillin (AMOXIL) 500 MG tablet Take 2,000 mg (4 capsules) 1 hour prior to all dental visits. 8 tablet 11   apixaban (ELIQUIS) 2.5 MG TABS tablet TAKE 1 TABLET BY MOUTH TWICE DAILY. 180 tablet 1   diltiazem (CARDIZEM CD) 120 MG 24 hr capsule Take 1 capsule (120 mg total) by mouth daily. 90 capsule 3   ferrous sulfate 325 (65 FE) MG tablet Take 325 mg by mouth daily with breakfast.     furosemide (LASIX) 40 MG tablet Take 0.5 tablets (20 mg total) by mouth daily.     guaiFENesin (MUCINEX) 600 MG 12 hr tablet Take  by mouth 2 (two) times daily.     isosorbide mononitrate (IMDUR) 60 MG 24 hr tablet TAKE 1 TABLET ONCE DAILY AFTER BREAKFAST.and May take an additional 60 mg in the evening for 2 days   if you use Nitroglycerin sublingual tablets 120 tablet 3   loperamide (IMODIUM A-D) 2 MG tablet Take 2 mg by mouth 4 (four) times daily as needed for diarrhea or loose stools.      Multiple Vitamin (MULTIVITAMIN PO) Take 1 tablet by mouth daily.      nitroGLYCERIN (NITROSTAT) 0.4 MG SL tablet ONE TABLET UNDER TONGUE WHEN NEEDED FOR CHEST PAIN. MAY REPEAT IN 5 MINUTES. 25 tablet 0   rosuvastatin (CRESTOR) 20 MG tablet TAKE (1) TABLET DAILY  AT BEDTIME. 90 tablet 1   topiramate (TOPAMAX) 100 MG tablet Take 1.5 tablets (150 mg total) by mouth daily. 135 tablet 3   topiramate (TOPAMAX) 200 MG tablet TAKE 1 TABLET ONCE DAILY. 90 tablet 0   vitamin B-12 (CYANOCOBALAMIN) 1000 MCG tablet Take 1,000 mcg by mouth daily.     No current facility-administered medications for this visit.     Objective:  BP 138/70   Pulse 63   Temp 98.5 F (36.9 C) (Temporal)   Ht 5\' 8"  (1.727 m)   Wt 192 lb 9.6 oz (87.4 kg)   SpO2 98%   BMI 29.28 kg/m  Gen: NAD, resting comfortably CV: RRR no murmurs rubs or gallops Lungs: CTAB no crackles, wheeze, rhonchi Abdomen: soft/nontender/nondistended/normal bowel sounds. No rebound or guarding.  Ext: 2+ edema Skin: warm, dry    Assessment and Plan    #Chronic fatigue-patient made some adjustments with cardiology including holding rosuvastatin, patient thought he was supposed to take iron half tablet every other day but he has returned to taking daily-this was in fact supposed to be Lasix it appears, reducing Imdur-he has not noted any provement in his overall fatigue -Redirected him to reduce Lasix to half tablet every other day instead of half tablet daily after he gets compression stockings -Restart statin since that has not made a difference in his symptoms -Patient with anemia as well as what appears to be worsening thrombocytopenia-referral to hematology was placed for their expert opinion.  Patient believes nephrology has not suggested further intervention for his ongoing anemia-CKD stage IV could certainly contribute -Grieving from loss of wife could contribute to symptoms-they have been very well over 60 years and dated for many years before that  Chest X-Ray reassuring 12/17/20  #hypertension-Lasix has controlled heart failure as well S: medication: Lasix 40 mg-takes half tablet daily, Diltiazem 120 mg extended release Home readings: BP Readings from Last 3 Encounters:  01/28/21 138/70   12/11/20 (!) 156/80  12/01/20 (!) 186/100  A/P: Well-controlled but high normal-continue current medication.  With ongoing fatigue we are going to try to get him to reduce Lasix to half tablet every other day as suggested by Dr. Ellyn Hack after he starts compression stockings (patient agrees to work on this)-we will keep an eye on blood pressure and for signs of worsening fluid overload  #CAD  #Status post TAVR #hyperlipidemia #Right-sided carotid artery disease-last imaged 10/30/2019-right ICA 60 to 79% stenosis with left stenosis 1 to 39% S: Medication: Rosuvastatin 20 mg in the past but currently on held due to fatigue, on Eliquis alone-not on aspirin per cardiology, Imdur 60 mg but taking 30 mg per Dr. Ellyn Hack -as had SOB and has fallen a couple a times but no injury (prefer for patient to  consistently use cane or walker), still uses the restroom ever hour and a half (mhorning, noon, night) but no recent change. Denies chest pain. - He is taking iron daily-he thought he was supposed to take this half tablet every other day but I believe that was in reference to the Lasix -Patient also still grieving loss of his wife Thayer Headings -He is also 58 and still working full-time Lab Results  Component Value Date   CHOL 91 11/29/2019   HDL 35 (L) 11/29/2019   LDLCALC 46 11/29/2019   LDLDIRECT 52.0 09/12/2018   TRIG 49 11/29/2019   CHOLHDL 2.6 11/29/2019  A/P: For CAD-doing well on Imdur 30 mg, Eliquis 2.5 mg twice daily.  We are going to restart statin as holding this for several weeks did not seem to benefit him in regards to fatigue  Lipids well-controlled on last check but due for yearly recheck-we opted to hold off since he has been off the rosuvastatin-likely recheck next visit  For carotid artery stenosis-continue current medications-has been stable-discussed keeping cardiology follow-up  # B12 deficiency S: Current treatment/medication (oral vs. IM):  Vitamin B-12 1000 mcg Lab Results   Component Value Date   VITAMINB12 963 07/19/2020  A/P: Well-controlled on last check-continue current medication-consider B12 recheck with next labs  # Atrial fibrillation S: Rate controlled with  Diltiazem 120 mg extended release Anticoagulated with Eliquis 2.5 mg twice daily A/P: Appropriately anticoagulated and rate controlled-continue current medication   #Chronic kidney disease stage IV S: GFR is typically in the high teens or 20s range A/P: Stable on last check-continue to monitor   #History of migraines-patient remains on Topamax and has been very helpful  -he is experiencing headaches daily- at least a year. Worsening as time progresses. -Considering renal function we opted to reduce Topamax to 150 mg and reassess at 3 months-may need to reduce to 100 mg -On the other hand patient reports significant work stress and persistent daily headaches-with worsening headache pattern above baseline opted to get MRI of the brain along with ongoing fatigue  Recommended follow up: Return in about 3 months (around 04/30/2021) for follow up- or sooner if needed. Future Appointments  Date Time Provider [place]  02/18/2021  9:30 AM [facility] NURSE [facility]  02/26/2021  1:50 PM [facility] ECHO 2 [id number]  03/19/2021  2:20 PM [name], MD [facility]  04/15/2021 10:30 AM [facility] CCM PHARMACIST [facility]  05/01/2021  4:20 PM [name], MD [facility]  11/27/2021  8:00 AM [facility] HEALTH COACH [facility]    Lab/Order associations:   ICD-10-CM   1. Paroxysmal atrial fibrillation ([place]); CHA2DSVasc - 4; Now on Eliquis  I48.0     2. Chronic kidney disease (CKD), active medical management without dialysis, stage 4 (severe) ([place])  N18.4 Ambulatory referral to Hematology / Oncology    3. Right-sided carotid artery disease, unspecified type ([place]) Chronic I77.9     4. Hyperlipidemia with target LDL less than 70  E78.5     5. Essential  hypertension  I10     6. New daily persistent headache  G44.52 MR Brain Wo Contrast    7. Thrombocytopenia ([place])  D69.6 Ambulatory referral to Hematology / Oncology    8. Anemia, unspecified type  D64.9 Ambulatory referral to Hematology / Oncology      Meds ordered this encounter  Medications   topiramate (TOPAMAX) 100 MG tablet    Sig: Take 1.5 tablets (150 mg total) by mouth daily.  Dispense:  135 tablet    Refill:  3    I,Harris Phan,acting as a scribe for [name], MD.,have documented all relevant documentation on the behalf of [name], MD,as directed by  [name], MD while in the presence of [name], MD.   I, [name], MD, have reviewed all documentation for this visit. The documentation on 01/28/21 for the exam, diagnosis, procedures, and orders are all accurate and complete.   Return precautions advised.  [name], MD

## 2021-01-28 NOTE — Patient Instructions (Addendum)
Health Maintenance Due  Topic Date Due   COVID-19 Vaccine (3 - Booster for Moderna series) Patient will call back with dates. He states that It was back in march but he doesn't know the date.  09/11/2020   Restart rosuvastatin 20 mg  trial lasix half tablet every other day after you get compression stockings and start using- go back to daily if increased swelling or worsening shortness of breat  Reduce topiramate to 150 mg - which is 1.5 of the new 100 mg tablet I sent in  We will call you within two weeks about your referral to MRI of the brain. If you do not hear within 2 weeks, give [place] a call.   We will call you within two weeks about your referral to hematology. If you do not hear within 2 weeks, give [place] a call.    Recommended follow up: Return in about 3 months (around 04/30/2021) for follow up- or sooner if needed.

## 2021-01-29 ENCOUNTER — Other Ambulatory Visit: Payer: Self-pay

## 2021-01-29 ENCOUNTER — Telehealth: Payer: Self-pay | Admitting: Oncology

## 2021-01-29 NOTE — Addendum Note (Signed)
Addended by: Aviva Signs M on: 01/29/2021 01:07 PM   Modules accepted: Orders

## 2021-01-29 NOTE — Addendum Note (Signed)
Addended by: Linton Ham on: 01/29/2021 01:07 PM   Modules accepted: Orders

## 2021-01-29 NOTE — Telephone Encounter (Signed)
Received a new hem referral from Dr. [name] for thrombocytopenia/anemia. Mark Jackson has been cld and scheduled to see Dr. Alen Blew on 6/23 at 11am. Pt aware to arrive 15 minutes early.

## 2021-01-30 ENCOUNTER — Telehealth: Payer: Self-pay | Admitting: Oncology

## 2021-01-30 ENCOUNTER — Other Ambulatory Visit: Payer: Self-pay

## 2021-01-30 ENCOUNTER — Inpatient Hospital Stay: Payer: Medicare Other | Attending: Oncology | Admitting: Oncology

## 2021-01-30 VITALS — BP 176/95 | HR 80 | Temp 97.0°F | Resp 17 | Ht 68.0 in | Wt 191.3 lb

## 2021-01-30 DIAGNOSIS — Z87891 Personal history of nicotine dependence: Secondary | ICD-10-CM | POA: Diagnosis not present

## 2021-01-30 DIAGNOSIS — D649 Anemia, unspecified: Secondary | ICD-10-CM

## 2021-01-30 DIAGNOSIS — I2510 Atherosclerotic heart disease of native coronary artery without angina pectoris: Secondary | ICD-10-CM | POA: Diagnosis not present

## 2021-01-30 DIAGNOSIS — D539 Nutritional anemia, unspecified: Secondary | ICD-10-CM | POA: Diagnosis not present

## 2021-01-30 DIAGNOSIS — Z808 Family history of malignant neoplasm of other organs or systems: Secondary | ICD-10-CM | POA: Insufficient documentation

## 2021-01-30 DIAGNOSIS — D631 Anemia in chronic kidney disease: Secondary | ICD-10-CM | POA: Insufficient documentation

## 2021-01-30 DIAGNOSIS — N189 Chronic kidney disease, unspecified: Secondary | ICD-10-CM | POA: Diagnosis not present

## 2021-01-30 DIAGNOSIS — D696 Thrombocytopenia, unspecified: Secondary | ICD-10-CM | POA: Diagnosis not present

## 2021-01-30 DIAGNOSIS — Z8041 Family history of malignant neoplasm of ovary: Secondary | ICD-10-CM | POA: Insufficient documentation

## 2021-01-30 DIAGNOSIS — D518 Other vitamin B12 deficiency anemias: Secondary | ICD-10-CM
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
## 2021-01-30 NOTE — Telephone Encounter (Signed)
Scheduled per los. Gave avs and calendar  

## 2021-02-06 ENCOUNTER — Inpatient Hospital Stay: Payer: Medicare Other

## 2021-02-06 ENCOUNTER — Other Ambulatory Visit: Payer: Self-pay

## 2021-02-06 VITALS — BP 148/72 | HR 78 | Temp 98.2°F | Resp 18

## 2021-02-06 DIAGNOSIS — D649 Anemia, unspecified: Secondary | ICD-10-CM

## 2021-02-06 DIAGNOSIS — D631 Anemia in chronic kidney disease: Secondary | ICD-10-CM | POA: Diagnosis not present

## 2021-02-06 DIAGNOSIS — N189 Chronic kidney disease, unspecified: Secondary | ICD-10-CM | POA: Diagnosis not present

## 2021-02-06 DIAGNOSIS — D696 Thrombocytopenia, unspecified: Secondary | ICD-10-CM | POA: Diagnosis not present

## 2021-02-06 DIAGNOSIS — D518 Other vitamin B12 deficiency anemias: Secondary | ICD-10-CM

## 2021-02-06 DIAGNOSIS — D539 Nutritional anemia, unspecified: Secondary | ICD-10-CM | POA: Diagnosis not present

## 2021-02-06 DIAGNOSIS — Z808 Family history of malignant neoplasm of other organs or systems: Secondary | ICD-10-CM | POA: Diagnosis not present

## 2021-02-06 DIAGNOSIS — Z87891 Personal history of nicotine dependence: Secondary | ICD-10-CM | POA: Diagnosis not present

## 2021-02-06 LAB — CBC WITH DIFFERENTIAL (CANCER CENTER ONLY)
Abs Immature Granulocytes: 0.05 10*3/uL (ref 0.00–0.07)
Basophils Absolute: 0.1 10*3/uL (ref 0.0–0.1)
Basophils Relative: 1 %
Eosinophils Absolute: 0.2 10*3/uL (ref 0.0–0.5)
Eosinophils Relative: 2 %
HCT: 31.9 % — ABNORMAL LOW (ref 39.0–52.0)
Hemoglobin: 10.5 g/dL — ABNORMAL LOW (ref 13.0–17.0)
Immature Granulocytes: 1 %
Lymphocytes Relative: 13 %
Lymphs Abs: 1.3 10*3/uL (ref 0.7–4.0)
MCH: 33.2 pg (ref 26.0–34.0)
MCHC: 32.9 g/dL (ref 30.0–36.0)
MCV: 100.9 fL — ABNORMAL HIGH (ref 80.0–100.0)
Monocytes Absolute: 0.9 10*3/uL (ref 0.1–1.0)
Monocytes Relative: 9 %
Neutro Abs: 7.6 10*3/uL (ref 1.7–7.7)
Neutrophils Relative %: 74 %
Platelet Count: 116 10*3/uL — ABNORMAL LOW (ref 150–400)
RBC: 3.16 MIL/uL — ABNORMAL LOW (ref 4.22–5.81)
RDW: 12.7 % (ref 11.5–15.5)
WBC Count: 10.1 10*3/uL (ref 4.0–10.5)
nRBC: 0 % (ref 0.0–0.2)

## 2021-02-06 LAB — FOLATE: Folate: 54.3 ng/mL (ref >5.9)

## 2021-02-06 LAB — VITAMIN B12: Vitamin B-12: 561 pg/mL (ref 180–914)

## 2021-02-06 MED ORDER — EPOETIN ALFA-EPBX 40000 UNIT/ML IJ SOLN
20000.0000 [IU] | Freq: Once | INTRAMUSCULAR | Status: AC
  Administered 2021-02-06: 20000 [IU] via SUBCUTANEOUS

## 2021-02-06 MED ORDER — EPOETIN ALFA-EPBX 10000 UNIT/ML IJ SOLN
INTRAMUSCULAR | Status: AC
  Filled 2021-02-06: qty 2

## 2021-02-06 NOTE — Patient Instructions (Signed)
Epoetin Alfa injection What is this medication? EPOETIN ALFA (e POE e tin AL fa) helps your body make more red blood cells. This medicine is used to treat anemia caused by chronic kidney disease, cancer chemotherapy, or HIV-therapy. It may also be used before surgery if you have anemia. This medicine may be used for other purposes; ask your health care provider or pharmacist if you have questions. COMMON BRAND NAME(S): Epogen, Procrit, Retacrit What should I tell my care team before I take this medication? They need to know if you have any of these conditions: cancer heart disease high blood pressure history of blood clots history of stroke low levels of folate, iron, or vitamin B12 in the blood seizures an unusual or allergic reaction to erythropoietin, albumin, benzyl alcohol, hamster proteins, other medicines, foods, dyes, or preservatives pregnant or trying to get pregnant breast-feeding How should I use this medication? This medicine is for injection into a vein or under the skin. It is usually given by a health care professional in a hospital or clinic setting. If you get this medicine at home, you will be taught how to prepare and give this medicine. Use exactly as directed. Take your medicine at regular intervals. Do not take your medicine more often than directed. It is important that you put your used needles and syringes in a special sharps container. Do not put them in a trash can. If you do not have a sharps container, call your pharmacist or healthcare provider to get one. A special MedGuide will be given to you by the pharmacist with each prescription and refill. Be sure to read this information carefully each time. Talk to your pediatrician regarding the use of this medicine in children. While this drug may be prescribed for selected conditions, precautions do apply. Overdosage: If you think you have taken too much of this medicine contact a poison control center or emergency  room at once. NOTE: This medicine is only for you. Do not share this medicine with others. What if I miss a dose? If you miss a dose, take it as soon as you can. If it is almost time for your next dose, take only that dose. Do not take double or extra doses. What may interact with this medication? Interactions have not been studied. This list may not describe all possible interactions. Give your health care provider a list of all the medicines, herbs, non-prescription drugs, or dietary supplements you use. Also tell them if you smoke, drink alcohol, or use illegal drugs. Some items may interact with your medicine. What should I watch for while using this medication? Your condition will be monitored carefully while you are receiving this medicine. You may need blood work done while you are taking this medicine. This medicine may cause a decrease in vitamin B6. You should make sure that you get enough vitamin B6 while you are taking this medicine. Discuss the foods you eat and the vitamins you take with your health care professional. What side effects may I notice from receiving this medication? Side effects that you should report to your doctor or health care professional as soon as possible: allergic reactions like skin rash, itching or hives, swelling of the face, lips, or tongue seizures signs and symptoms of a blood clot such as breathing problems; changes in vision; chest pain; severe, sudden headache; pain, swelling, warmth in the leg; trouble speaking; sudden numbness or weakness of the face, arm or leg signs and symptoms of a stroke like   changes in vision; confusion; trouble speaking or understanding; severe headaches; sudden numbness or weakness of the face, arm or leg; trouble walking; dizziness; loss of balance or coordination Side effects that usually do not require medical attention (report to your doctor or health care professional if they continue or are  bothersome): chills cough dizziness fever headaches joint pain muscle cramps muscle pain nausea, vomiting pain, redness, or irritation at site where injected This list may not describe all possible side effects. Call your doctor for medical advice about side effects. You may report side effects to FDA at 1-800-FDA-1088. Where should I keep my medication? Keep out of the reach of children. Store in a refrigerator between 2 and 8 degrees C (36 and 46 degrees F). Do not freeze or shake. Throw away any unused portion if using a single-dose vial. Multi-dose vials can be kept in the refrigerator for up to 21 days after the initial dose. Throw away unused medicine. NOTE: This sheet is a summary. It may not cover all possible information. If you have questions about this medicine, talk to your doctor, pharmacist, or health care provider.  2022 Elsevier/Gold Standard (2017-03-05 08:35:19)  

## 2021-02-07 LAB — ERYTHROPOIETIN: Erythropoietin: 11.2 m[IU]/mL (ref 2.6–18.5)

## 2021-02-08 ENCOUNTER — Ambulatory Visit
Admission: RE | Admit: 2021-02-08 | Discharge: 2021-02-08 | Disposition: A | Discharge status: Home / Self Care | Payer: Medicare Other | Source: Ambulatory Visit | Attending: Family Medicine | Admitting: Family Medicine

## 2021-02-08 ENCOUNTER — Other Ambulatory Visit: Payer: Self-pay

## 2021-02-08 DIAGNOSIS — I639 Cerebral infarction, unspecified: Secondary | ICD-10-CM | POA: Diagnosis not present

## 2021-02-08 DIAGNOSIS — Q046 Congenital cerebral cysts: Secondary | ICD-10-CM | POA: Diagnosis not present

## 2021-02-08 DIAGNOSIS — I6782 Cerebral ischemia: Secondary | ICD-10-CM | POA: Diagnosis not present

## 2021-02-08 DIAGNOSIS — G4452 New daily persistent headache (NDPH): Secondary | ICD-10-CM

## 2021-02-11 LAB — MULTIPLE MYELOMA PANEL, SERUM
Albumin SerPl Elph-Mcnc: 3.6 g/dL (ref 2.9–4.4)
Albumin/Glob SerPl: 1.4 (ref 0.7–1.7)
Alpha 1: 0.2 g/dL (ref 0.0–0.4)
Alpha2 Glob SerPl Elph-Mcnc: 0.5 g/dL (ref 0.4–1.0)
B-Globulin SerPl Elph-Mcnc: 0.9 g/dL (ref 0.7–1.3)
Gamma Glob SerPl Elph-Mcnc: 1.2 g/dL (ref 0.4–1.8)
Globulin, Total: 2.7 g/dL (ref 2.2–3.9)
IgA: 304 mg/dL (ref 61–437)
IgG (Immunoglobin G), Serum: 1208 mg/dL (ref 603–1613)
IgM (Immunoglobulin M), Srm: 30 mg/dL (ref 15–143)
Total Protein ELP: 6.3 g/dL (ref 6.0–8.5)

## 2021-02-18 ENCOUNTER — Ambulatory Visit (INDEPENDENT_AMBULATORY_CARE_PROVIDER_SITE_OTHER): Payer: Medicare Other | Admitting: *Deleted

## 2021-02-18 ENCOUNTER — Encounter: Payer: Self-pay | Admitting: *Deleted

## 2021-02-18 ENCOUNTER — Other Ambulatory Visit: Payer: Self-pay

## 2021-02-18 DIAGNOSIS — E538 Deficiency of other specified B group vitamins: Secondary | ICD-10-CM

## 2021-02-18 MED ORDER — CYANOCOBALAMIN 1000 MCG/ML IJ SOLN
1000.0000 ug | Freq: Once | INTRAMUSCULAR | Status: AC
  Administered 2021-02-18: 1000 ug via INTRAMUSCULAR

## 2021-02-18 NOTE — Progress Notes (Signed)
Per orders of Dr. Hunter, injection of Cyanocobalamin 1000 mcg/ml given IM by Kirt Chew, LPN in right deltoid. Patient tolerated injection well. Patient will make appointment for 1 month.   

## 2021-02-18 NOTE — Progress Notes (Signed)
I have reviewed and agree with note, evaluation, plan.   Takeira Yanes, MD  

## 2021-02-25 ENCOUNTER — Ambulatory Visit (INDEPENDENT_AMBULATORY_CARE_PROVIDER_SITE_OTHER): Payer: Medicare Other | Admitting: Family Medicine

## 2021-02-25 ENCOUNTER — Other Ambulatory Visit: Payer: Self-pay

## 2021-02-25 ENCOUNTER — Ambulatory Visit: Payer: Self-pay

## 2021-02-25 VITALS — BP 158/100 | HR 70 | Ht 68.0 in | Wt 186.6 lb

## 2021-02-25 DIAGNOSIS — M5416 Radiculopathy, lumbar region: Secondary | ICD-10-CM | POA: Diagnosis not present

## 2021-02-25 DIAGNOSIS — M25562 Pain in left knee: Secondary | ICD-10-CM

## 2021-02-25 DIAGNOSIS — Z9861 Coronary angioplasty status: Secondary | ICD-10-CM

## 2021-02-25 DIAGNOSIS — I2510 Atherosclerotic heart disease of native coronary artery without angina pectoris: Secondary | ICD-10-CM | POA: Diagnosis not present

## 2021-02-25 DIAGNOSIS — G8929 Other chronic pain: Secondary | ICD-10-CM

## 2021-02-25 NOTE — Progress Notes (Signed)
I, Peterson Lombard, LAT, ATC acting as a scribe for Lynne Leader, MD.  Subjective:    CC: Left hip and L knee pain  HPI: Pt is an 87 y/o male c/o L hip and knee pain. Pt has had a TKR on the R knee. Pt locates pain to L buttock w/ radiating pain into L leg to foot. Pt reports Dr. Nelva Bush had been getting "back injection" but hasn't had one for several years, 2019. Pt c/o pain "aching" at night. Pt locates L knee pain to the anterior aspect ongoing since 2008 when he fell on the ice.  L knee swelling: yes Aggravates: activity Treatments tried: creams, heat, cold  Dx imaging: 11/27/19 L knee XR  05/23/09  Pertinent review of Systems: No fevers or chills  Relevant historical information: Atrial fibrillation.  Coronary artery disease.   Objective:    Vitals:   02/25/21 1520  BP: (!) 158/100  Pulse: 70  SpO2: 98%   General: Well Developed, well nourished, and in no acute distress.  Drug-eluting stent.  Takes Eliquis  MSK: L-spine nontender midline decreased lumbar motion.  Left knee mild effusion normal motion with crepitation.  Tender palpation medial joint line.  Stable ligamentous exam.  Lab and Radiology Results  Procedure: Real-time Ultrasound Guided Injection of left knee superior lateral patellar space Device: Philips Affiniti 50G Images permanently stored and available for review in PACS Verbal informed consent obtained.  Discussed risks and benefits of procedure. Warned about infection bleeding damage to structures skin hypopigmentation and fat atrophy among others. Patient expresses understanding and agreement Time-out conducted.   Noted no overlying erythema, induration, or other signs of local infection.   Skin prepped in a sterile fashion.   Local anesthesia: Topical Ethyl chloride.   With sterile technique and under real time ultrasound guidance: 40 mg of Kenalog and 2 mL of Marcaine injected into knee joint. Fluid seen entering the joint capsule.   Completed  without difficulty   Pain immediately resolved suggesting accurate placement of the medication.   Advised to call if fevers/chills, erythema, induration, drainage, or persistent bleeding.   Images permanently stored and available for review in the ultrasound unit.  Impression: Technically successful ultrasound guided injection.   EXAM: LEFT KNEE - COMPLETE 4+ VIEW   COMPARISON:  None.   FINDINGS: Extensive medial and lateral meniscal calcifications. Similar appearing calcifications adjacent to the superior aspect of the patella posteriorly. No fracture, dislocation or effusion seen.   IMPRESSION: 1. No fracture or effusion. 2. Extensive chondrocalcinosis.     Electronically Signed   By: Claudie Revering M.D.   On: 11/27/2019 18:12 I, Lynne Leader, personally (independently) visualized and performed the interpretation of the images attached in this note.      Impression and Recommendations:    Assessment and Plan: 87 y.o. male with left knee pain due to exacerbation of DJD or possible pseudogout.  Patient had significant chondrocalcinosis on x-ray in April of last year.  Plan for steroid injection.  He is not a good candidate for colchicine due to drug drug interaction with his other medications.  Recheck in about 6 weeks.  Additionally patient has what seems to be L3 lumbar radiculopathy on the left side.  He describes history of epidural steroid injections with Dr. Nelva Bush at [facility] in the past which worked quite well.  We will request medical records to get copy of MRI and injection history and repeat the injection.  He will obviously need to stop his  anticoagulation for this.Marland Kitchen  PDMP not reviewed this encounter. Orders Placed This Encounter  Procedures   [place] LIMITED JOINT SPACE STRUCTURES LOW LEFT(NO LINKED CHARGES)    Standing Status:   Future    Number of Occurrences:   1    Standing Expiration Date:   08/28/2021    Order Specific Question:   Reason for Exam  (SYMPTOM  OR DIAGNOSIS REQUIRED)    Answer:   left knee pain    Order Specific Question:   Preferred imaging location?    Answer:   [place]   No orders of the defined types were placed in this encounter.   Discussed warning signs or symptoms. Please see discharge instructions. Patient expresses understanding.   The above documentation has been reviewed and is accurate and complete Lynne Leader, M.D.

## 2021-02-25 NOTE — Patient Instructions (Signed)
Thank you for coming in today.   I will request medical records.   Recheck as needed.   I will get set up for back injection just like Dr Nelva Bush did once I get records.   If you want to see Dr Nelva Bush again that is ok with me.

## 2021-02-26 ENCOUNTER — Ambulatory Visit (HOSPITAL_COMMUNITY): Payer: Medicare Other | Attending: Cardiology

## 2021-02-26 DIAGNOSIS — Z952 Presence of prosthetic heart valve: Secondary | ICD-10-CM

## 2021-02-26 HISTORY — PX: TRANSTHORACIC ECHOCARDIOGRAM: SHX275

## 2021-02-26 LAB — ECHOCARDIOGRAM COMPLETE
AR max vel: 1.63 cm2
AV Area VTI: 1.46 cm2
AV Area mean vel: 1.53 cm2
AV Mean grad: 15.8 mmHg
AV Peak grad: 27.6 mmHg
Ao pk vel: 2.63 m/s
Area-P 1/2: 3.13 cm2
S' Lateral: 2.8 cm

## 2021-02-27 ENCOUNTER — Inpatient Hospital Stay: Payer: Medicare Other

## 2021-02-27 ENCOUNTER — Inpatient Hospital Stay: Payer: Medicare Other | Attending: Oncology

## 2021-02-27 ENCOUNTER — Other Ambulatory Visit: Payer: Self-pay

## 2021-02-27 VITALS — BP 162/87 | HR 72 | Temp 98.4°F | Resp 17

## 2021-02-27 DIAGNOSIS — D631 Anemia in chronic kidney disease: Secondary | ICD-10-CM | POA: Diagnosis present

## 2021-02-27 DIAGNOSIS — N189 Chronic kidney disease, unspecified: Secondary | ICD-10-CM | POA: Insufficient documentation

## 2021-02-27 DIAGNOSIS — D518 Other vitamin B12 deficiency anemias: Secondary | ICD-10-CM

## 2021-02-27 DIAGNOSIS — D649 Anemia, unspecified: Secondary | ICD-10-CM

## 2021-02-27 LAB — CBC WITH DIFFERENTIAL (CANCER CENTER ONLY)
Abs Immature Granulocytes: 0.05 10*3/uL (ref 0.00–0.07)
Basophils Absolute: 0.1 10*3/uL (ref 0.0–0.1)
Basophils Relative: 1 %
Eosinophils Absolute: 0.2 10*3/uL (ref 0.0–0.5)
Eosinophils Relative: 2 %
HCT: 32.2 % — ABNORMAL LOW (ref 39.0–52.0)
Hemoglobin: 10.6 g/dL — ABNORMAL LOW (ref 13.0–17.0)
Immature Granulocytes: 0 %
Lymphocytes Relative: 11 %
Lymphs Abs: 1.3 10*3/uL (ref 0.7–4.0)
MCH: 33.2 pg (ref 26.0–34.0)
MCHC: 32.9 g/dL (ref 30.0–36.0)
MCV: 100.9 fL — ABNORMAL HIGH (ref 80.0–100.0)
Monocytes Absolute: 1 10*3/uL (ref 0.1–1.0)
Monocytes Relative: 8 %
Neutro Abs: 9.7 10*3/uL — ABNORMAL HIGH (ref 1.7–7.7)
Neutrophils Relative %: 78 %
Platelet Count: 91 10*3/uL — ABNORMAL LOW (ref 150–400)
RBC: 3.19 MIL/uL — ABNORMAL LOW (ref 4.22–5.81)
RDW: 12.7 % (ref 11.5–15.5)
WBC Count: 12.3 10*3/uL — ABNORMAL HIGH (ref 4.0–10.5)
nRBC: 0 % (ref 0.0–0.2)

## 2021-02-27 MED ORDER — EPOETIN ALFA-EPBX 40000 UNIT/ML IJ SOLN: 20000.0000 [IU] | Freq: Once | INTRAMUSCULAR | Status: DC

## 2021-02-27 MED ORDER — EPOETIN ALFA-EPBX 10000 UNIT/ML IJ SOLN
INTRAMUSCULAR | Status: AC
  Filled 2021-02-27: qty 2

## 2021-02-27 MED ORDER — EPOETIN ALFA-EPBX 20000 UNIT/ML IJ SOLN
20000.0000 [IU] | Freq: Once | INTRAMUSCULAR | Status: AC
  Administered 2021-02-27: 20000 [IU] via SUBCUTANEOUS
  Filled 2021-02-27: qty 1

## 2021-02-27 NOTE — Patient Instructions (Signed)
Epoetin Alfa injection What is this medication? EPOETIN ALFA (e POE e tin AL fa) helps your body make more red blood cells. This medicine is used to treat anemia caused by chronic kidney disease, cancer chemotherapy, or HIV-therapy. It may also be used before surgery if you have anemia. This medicine may be used for other purposes; ask your health care provider or pharmacist if you have questions. COMMON BRAND NAME(S): Epogen, Procrit, Retacrit What should I tell my care team before I take this medication? They need to know if you have any of these conditions: cancer heart disease high blood pressure history of blood clots history of stroke low levels of folate, iron, or vitamin B12 in the blood seizures an unusual or allergic reaction to erythropoietin, albumin, benzyl alcohol, hamster proteins, other medicines, foods, dyes, or preservatives pregnant or trying to get pregnant breast-feeding How should I use this medication? This medicine is for injection into a vein or under the skin. It is usually given by a health care professional in a hospital or clinic setting. If you get this medicine at home, you will be taught how to prepare and give this medicine. Use exactly as directed. Take your medicine at regular intervals. Do not take your medicine more often than directed. It is important that you put your used needles and syringes in a special sharps container. Do not put them in a trash can. If you do not have a sharps container, call your pharmacist or healthcare provider to get one. A special MedGuide will be given to you by the pharmacist with each prescription and refill. Be sure to read this information carefully each time. Talk to your pediatrician regarding the use of this medicine in children. While this drug may be prescribed for selected conditions, precautions do apply. Overdosage: If you think you have taken too much of this medicine contact a poison control center or emergency  room at once. NOTE: This medicine is only for you. Do not share this medicine with others. What if I miss a dose? If you miss a dose, take it as soon as you can. If it is almost time for your next dose, take only that dose. Do not take double or extra doses. What may interact with this medication? Interactions have not been studied. This list may not describe all possible interactions. Give your health care provider a list of all the medicines, herbs, non-prescription drugs, or dietary supplements you use. Also tell them if you smoke, drink alcohol, or use illegal drugs. Some items may interact with your medicine. What should I watch for while using this medication? Your condition will be monitored carefully while you are receiving this medicine. You may need blood work done while you are taking this medicine. This medicine may cause a decrease in vitamin B6. You should make sure that you get enough vitamin B6 while you are taking this medicine. Discuss the foods you eat and the vitamins you take with your health care professional. What side effects may I notice from receiving this medication? Side effects that you should report to your doctor or health care professional as soon as possible: allergic reactions like skin rash, itching or hives, swelling of the face, lips, or tongue seizures signs and symptoms of a blood clot such as breathing problems; changes in vision; chest pain; severe, sudden headache; pain, swelling, warmth in the leg; trouble speaking; sudden numbness or weakness of the face, arm or leg signs and symptoms of a stroke like   changes in vision; confusion; trouble speaking or understanding; severe headaches; sudden numbness or weakness of the face, arm or leg; trouble walking; dizziness; loss of balance or coordination Side effects that usually do not require medical attention (report to your doctor or health care professional if they continue or are  bothersome): chills cough dizziness fever headaches joint pain muscle cramps muscle pain nausea, vomiting pain, redness, or irritation at site where injected This list may not describe all possible side effects. Call your doctor for medical advice about side effects. You may report side effects to FDA at 1-800-FDA-1088. Where should I keep my medication? Keep out of the reach of children. Store in a refrigerator between 2 and 8 degrees C (36 and 46 degrees F). Do not freeze or shake. Throw away any unused portion if using a single-dose vial. Multi-dose vials can be kept in the refrigerator for up to 21 days after the initial dose. Throw away unused medicine. NOTE: This sheet is a summary. It may not cover all possible information. If you have questions about this medicine, talk to your doctor, pharmacist, or health care provider.  2022 Elsevier/Gold Standard (2017-03-05 08:35:19)  

## 2021-03-19 ENCOUNTER — Ambulatory Visit (INDEPENDENT_AMBULATORY_CARE_PROVIDER_SITE_OTHER): Payer: Medicare Other | Admitting: Cardiology

## 2021-03-19 ENCOUNTER — Other Ambulatory Visit: Payer: Self-pay

## 2021-03-19 ENCOUNTER — Encounter: Payer: Self-pay | Admitting: Cardiology

## 2021-03-19 VITALS — BP 136/78 | HR 73 | Ht 68.0 in | Wt 184.4 lb

## 2021-03-19 DIAGNOSIS — E785 Hyperlipidemia, unspecified: Secondary | ICD-10-CM | POA: Diagnosis not present

## 2021-03-19 DIAGNOSIS — R600 Localized edema: Secondary | ICD-10-CM | POA: Diagnosis not present

## 2021-03-19 DIAGNOSIS — I779 Disorder of arteries and arterioles, unspecified: Secondary | ICD-10-CM

## 2021-03-19 DIAGNOSIS — I2510 Atherosclerotic heart disease of native coronary artery without angina pectoris: Secondary | ICD-10-CM

## 2021-03-19 DIAGNOSIS — R55 Syncope and collapse: Secondary | ICD-10-CM

## 2021-03-19 DIAGNOSIS — I10 Essential (primary) hypertension: Secondary | ICD-10-CM

## 2021-03-19 DIAGNOSIS — I480 Paroxysmal atrial fibrillation: Secondary | ICD-10-CM

## 2021-03-19 DIAGNOSIS — N184 Chronic kidney disease, stage 4 (severe): Secondary | ICD-10-CM | POA: Diagnosis not present

## 2021-03-19 DIAGNOSIS — Z7901 Long term (current) use of anticoagulants: Secondary | ICD-10-CM

## 2021-03-19 DIAGNOSIS — I25119 Atherosclerotic heart disease of native coronary artery with unspecified angina pectoris: Secondary | ICD-10-CM

## 2021-03-19 DIAGNOSIS — I5032 Chronic diastolic (congestive) heart failure: Secondary | ICD-10-CM | POA: Diagnosis not present

## 2021-03-19 DIAGNOSIS — Z952 Presence of prosthetic heart valve: Secondary | ICD-10-CM

## 2021-03-19 DIAGNOSIS — Z9861 Coronary angioplasty status: Secondary | ICD-10-CM | POA: Diagnosis not present

## 2021-03-19 NOTE — Patient Instructions (Addendum)
Medication Instructions:   Take a statin holiday ( meaning stop taking Rosuvastatin 20 mg ) for one month - if symptoms get better restart by taking 1/2 dose of Rosuvastatin 20 mg  daily - equal 10 mg.  *If you need a refill on your cardiac medications before your next appointment, please call your pharmacy*   Lab Work: LIPID  CMP - IN Nov. or Dec. 2022 If you have labs (blood work) drawn today and your tests are completely normal, you will receive your results only by: [place] (if you have MyChart) OR A paper copy in the mail If you have any lab test that is abnormal or we need to change your treatment, we will call you to review the results.   Testing/Procedures:  NOT NEEDED  Follow-Up: At [facility], you and your health needs are our priority.  As part of our continuing mission to provide you with exceptional heart care, we have created designated Provider Care Teams.  These Care Teams include your primary Cardiologist (physician) and Advanced Practice Providers (APPs -  Physician Assistants and Nurse Practitioners) who all work together to provide you with the care you need, when you need it.     Your next appointment:   5 TO 6 MONTH  month(s)  The format for your next appointment:   In Person  Provider:   [name], MD   Other Instructions

## 2021-03-19 NOTE — Progress Notes (Signed)
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    ==================================================  COVID-19 Education: The signs and symptoms of COVID-19 were discussed with the patient and how to seek care for testing (follow up with PCP or arrange E-visit).    I spent a total of 65minutes with the patient spent in direct patient consultation.  Additional time spent with chart review  / charting (studies, outside notes, etc): 18 min Total Time: 43 min  Current medicines are reviewed at length with the patient today.  (+/- concerns) N/A  This visit occurred during the SARS-CoV-2 public health emergency.  Safety protocols were in place, including screening questions prior to the visit, additional usage of staff PPE, and extensive cleaning of exam room while observing appropriate contact time as indicated for disinfecting solutions.  Notice: This dictation was prepared with Dragon dictation along with smaller phrase technology. Any  transcriptional errors that result from this process are unintentional and may not be corrected upon review.  Patient Instructions / Medication Changes & Studies & Tests Ordered   Patient Instructions  Medication Instructions:   Take a statin holiday ( meaning stop taking Rosuvastatin 20 mg ) for one month - if symptoms get better restart by taking 1/2 dose of Rosuvastatin 20 mg  daily - equal 10 mg.  *If you need a refill on your cardiac medications before your next appointment, please call your pharmacy*   Lab Work: LIPID  CMP - IN Nov. or Dec. 2022 If you have labs (blood work) drawn today and your tests are completely normal, you will receive your results only by: [place] (if you have MyChart) OR A paper copy in the mail If you have any lab test that is abnormal or we need to change your treatment, we will call you to review the results.   Testing/Procedures:  NOT NEEDED  Follow-Up: At [facility], you and your health needs are our priority.  As part of our continuing mission to provide you with exceptional heart care, we have created designated Provider Care Teams.  These Care Teams include your primary Cardiologist (physician) and Advanced Practice Providers (  APPs -  Physician Assistants and Nurse Practitioners) who all work together to provide you with the care you need, when you need it.     Your next appointment:   5 TO 6 MONTH  month(s)  The format for your next appointment:   In Person  Provider:   [name], MD   Other Instructions    Studies Ordered:   Orders Placed This Encounter  Procedures   Lipid panel   Comprehensive metabolic panel   EKG 19-ERDE      [name], M.D., M.S. Interventional Cardiologist   Pager # [phone] Phone # [phone] [address]. [place], [place] [address]   Thank you for choosing [facility] at [facility]!!

## 2021-03-20 ENCOUNTER — Inpatient Hospital Stay: Payer: Medicare Other

## 2021-03-20 ENCOUNTER — Inpatient Hospital Stay: Payer: Medicare Other | Attending: Oncology

## 2021-03-20 DIAGNOSIS — D518 Other vitamin B12 deficiency anemias: Secondary | ICD-10-CM

## 2021-03-20 DIAGNOSIS — N189 Chronic kidney disease, unspecified: Secondary | ICD-10-CM | POA: Diagnosis not present

## 2021-03-20 DIAGNOSIS — D649 Anemia, unspecified: Secondary | ICD-10-CM | POA: Diagnosis not present

## 2021-03-20 LAB — CBC WITH DIFFERENTIAL (CANCER CENTER ONLY)
Abs Immature Granulocytes: 0.04 10*3/uL (ref 0.00–0.07)
Basophils Absolute: 0.1 10*3/uL (ref 0.0–0.1)
Basophils Relative: 1 %
Eosinophils Absolute: 0.2 10*3/uL (ref 0.0–0.5)
Eosinophils Relative: 2 %
HCT: 37.8 % — ABNORMAL LOW (ref 39.0–52.0)
Hemoglobin: 12.4 g/dL — ABNORMAL LOW (ref 13.0–17.0)
Immature Granulocytes: 0 %
Lymphocytes Relative: 9 %
Lymphs Abs: 1 10*3/uL (ref 0.7–4.0)
MCH: 33.2 pg (ref 26.0–34.0)
MCHC: 32.8 g/dL (ref 30.0–36.0)
MCV: 101.1 fL — ABNORMAL HIGH (ref 80.0–100.0)
Monocytes Absolute: 1 10*3/uL (ref 0.1–1.0)
Monocytes Relative: 8 %
Neutro Abs: 9.6 10*3/uL — ABNORMAL HIGH (ref 1.7–7.7)
Neutrophils Relative %: 80 %
Platelet Count: 78 10*3/uL — ABNORMAL LOW (ref 150–400)
RBC: 3.74 MIL/uL — ABNORMAL LOW (ref 4.22–5.81)
RDW: 12.8 % (ref 11.5–15.5)
WBC Count: 11.9 10*3/uL — ABNORMAL HIGH (ref 4.0–10.5)
nRBC: 0 % (ref 0.0–0.2)

## 2021-03-20 NOTE — Progress Notes (Signed)
Pt's Hgb is 12.4. Per parameters pt does not need Retacrit injection today. Pt informed and given copy of lab work.

## 2021-03-21 ENCOUNTER — Encounter: Payer: Self-pay | Admitting: Cardiology

## 2021-03-21 NOTE — Assessment & Plan Note (Signed)
Overlapping stents in the LAD.  Patent by last cath.  Not on aspirin or Plavix because of the need for Eliquis.  No further angina symptoms.  He was well aware of his symptoms actually presented indicating that he was having symptoms of angina.  He is on statin with adjusting doses now. Not on beta-blocker-on diltiazem which with normal EF is acceptable.  On Imdur for antianginal benefit.

## 2021-03-21 NOTE — Assessment & Plan Note (Signed)
Probably is related to heart failure than venous stasis.  Continue support stockings.  He does not do well with a tighter stockings.  He is on the lighter weight stockings but they are doing okay.  On stable dose of diuretic.  He says that his legs do go down at night but not all the way down.  No associated orthopnea or PND.  Continue low-dose Lasix and support stockings.

## 2021-03-21 NOTE — Assessment & Plan Note (Signed)
On reduced dose of Eliquis because of renal insufficiency.  Temporarily on hold for hematuria which may have been related to UTI.  Recheck CBC levels relatively stable CBC levels.  Hemoglobin increasing.  Not likely bleeding further.

## 2021-03-21 NOTE — Assessment & Plan Note (Signed)
Thankfully, his valve gradients improved back to baseline down from 26-16 on follow-up echocardiogram.  Somewhat concerning that being off Eliquis, the gradients change.  This would suggest possibility of mild thrombus buildup on TAVR valve. --> this makes coming off of Eliquis difficult.  Any gaps in care should be done with caution.

## 2021-03-21 NOTE — Assessment & Plan Note (Signed)
Plan is to allow for permissive hypertension.  He has had issues with orthostatic hypotension and some neurocardiogenic issues.  At this point, I recommending increase allowing for systolic pressures as high as 160.  Plan: Continue current low-dose diltiazem and Imdur. Continue with reduced dose of furosemide-take extra doses if necessary for edema.Marland Kitchen

## 2021-03-21 NOTE — Assessment & Plan Note (Signed)
Due for labs to be rechecked.  His most recent labs as of April 2021 had LDL 46.  Hopefully be potentially come down off of the dose of rosuvastatin.  We will start off with a 2-week statin holiday to see if this helps with the fatigue and muscle aches.  If symptoms do improve, he can restart rosuvastatin half tablet dose.  (10 mg).  Otherwise, he will if no change in symptoms he would just return to same dose.   He does need lipids and chemistries checked in November-December timeframe.

## 2021-03-21 NOTE — Assessment & Plan Note (Signed)
No further episodes of syncope or collapse.  Allowing for permissive hypertension, compression stockings which we may need to tighten if he has more episodes.  Not yet at the point of doing abdominal binders.  His pressures not low enough to consider midodrine.

## 2021-03-21 NOTE — Assessment & Plan Note (Signed)
Followed by Dr. Trula Slade.  Last evaluation was in 2021. Also follows abdominal ulcer.  We will certainly like to maintain Bhagat under therapy with lipid-lowering, however with fatigue I am going to allow for statin holiday.

## 2021-03-21 NOTE — Assessment & Plan Note (Signed)
Preserved EF with diastolic function from longstanding aortic valve disease.  He is denying any PND or orthopnea.  I suspect that some of his edema is probably venous stasis related.  Really just NYHA class I-II symptoms indicated that does not really much in the way of significant PND, orthopnea, or exertional dyspnea.  Just exercise intolerance. No beta-blocker because of fatigue.  Using very low-dose diltiazem. On low-dose Lasix but okay to use additional doses if necessary.

## 2021-03-21 NOTE — Assessment & Plan Note (Signed)
Doing well.  No breakthrough spells from a symptom standpoint. Remains on stable low-dose diltiazem for rate control and Eliquis at reduced dose.  Thankfully, not really having any more hematuria.  I would prefer to avoid holding Eliquis unless there is significant bleeding.  Being off Eliquis for short time was somewhat concerning for worsening aortic valve gradients.  Being back on Eliquis improved gradients back to normal..

## 2021-03-21 NOTE — Assessment & Plan Note (Signed)
Now being followed routinely by nephrology.  Partly allowing for permissive hypertension to allow for renal perfusion.  Avoid hypotension and nephrotoxic agents. Not on ACE or ARB.  Continue low-dose diuretic.  Seems to be responding well.

## 2021-03-25 ENCOUNTER — Other Ambulatory Visit: Payer: Self-pay

## 2021-03-25 ENCOUNTER — Ambulatory Visit (INDEPENDENT_AMBULATORY_CARE_PROVIDER_SITE_OTHER): Payer: Medicare Other

## 2021-03-25 DIAGNOSIS — E538 Deficiency of other specified B group vitamins: Secondary | ICD-10-CM | POA: Diagnosis not present

## 2021-03-25 MED ORDER — CYANOCOBALAMIN 1000 MCG/ML IJ SOLN
1000.0000 ug | Freq: Once | INTRAMUSCULAR | Status: AC
  Administered 2021-03-25: 1000 ug via INTRAMUSCULAR

## 2021-03-25 NOTE — Progress Notes (Signed)
Per orders of Dr. Hunter, injection of B-12 given by Barrington Worley D Monteen Toops in left deltoid. Patient tolerated injection well. Patient will make appointment for 1 month.  

## 2021-03-26 ENCOUNTER — Telehealth: Payer: Self-pay | Admitting: Pharmacist

## 2021-03-26 ENCOUNTER — Ambulatory Visit (INDEPENDENT_AMBULATORY_CARE_PROVIDER_SITE_OTHER): Payer: Medicare Other | Admitting: Physician Assistant

## 2021-03-26 ENCOUNTER — Other Ambulatory Visit: Payer: Self-pay

## 2021-03-26 ENCOUNTER — Encounter: Payer: Self-pay | Admitting: Physician Assistant

## 2021-03-26 VITALS — BP 150/90 | HR 75 | Temp 98.3°F | Ht 68.0 in | Wt 181.4 lb

## 2021-03-26 DIAGNOSIS — G43009 Migraine without aura, not intractable, without status migrainosus: Secondary | ICD-10-CM | POA: Diagnosis not present

## 2021-03-26 DIAGNOSIS — R310 Gross hematuria: Secondary | ICD-10-CM | POA: Diagnosis not present

## 2021-03-26 DIAGNOSIS — G4452 New daily persistent headache (NDPH): Secondary | ICD-10-CM

## 2021-03-26 LAB — POC URINALSYSI DIPSTICK (AUTOMATED)
Bilirubin, UA: NEGATIVE
Blood, UA: POSITIVE
Glucose, UA: NEGATIVE
Ketones, UA: NEGATIVE
Nitrite, UA: NEGATIVE
Protein, UA: POSITIVE — AB
Spec Grav, UA: 1.02 (ref 1.010–1.025)
Urobilinogen, UA: 0.2 E.U./dL
pH, UA: 5 (ref 5.0–8.0)

## 2021-03-26 MED ORDER — TOPIRAMATE 100 MG PO TABS: 150.0000 mg | ORAL_TABLET | Freq: Every day | ORAL | 3 refills | Status: DC

## 2021-03-26 MED ORDER — TRAMADOL HCL 50 MG PO TABS: 50.0000 mg | ORAL_TABLET | Freq: Three times a day (TID) | ORAL | 0 refills | Status: DC | PRN

## 2021-03-26 MED ORDER — CEPHALEXIN 500 MG PO CAPS: 500.0000 mg | ORAL_CAPSULE | Freq: Two times a day (BID) | ORAL | 0 refills | Status: AC

## 2021-03-26 NOTE — Chronic Care Management (AMB) (Addendum)
Chronic Care Management Pharmacy Assistant   Name: Mark Jackson.  MRN: [id number] DOB: 07-Apr-1930  Reason for Encounter: Hypertension Adherence Call    Recent office visits:  03/26/2021 Sherle Poe, Aldona Bar, PA; Keflex 500 mg BID for possible cystitis and Tramadol 50 mg TID prn for new daily persistent headache   Recent consult visits:  03/19/2021 [facility] (cardiology) [name], MD; two week statin holiday due to fatigue and muscle aches. If symptoms do improve, he can restart rosuvastatin half tablet dose (10 mg).  Hospital visits:  None in previous 6 months  Medications: Outpatient Encounter Medications as of 03/26/2021  Medication Sig Note   acetaminophen (TYLENOL) 325 MG tablet Take 325-650 mg by mouth every 6 (six) hours as needed for moderate pain or headache.     amoxicillin (AMOXIL) 500 MG tablet Take 2,000 mg (4 capsules) 1 hour prior to all dental visits.    apixaban (ELIQUIS) 2.5 MG TABS tablet TAKE 1 TABLET BY MOUTH TWICE DAILY.    cephALEXin (KEFLEX) 500 MG capsule Take 1 capsule (500 mg total) by mouth 2 (two) times daily for 10 days.    diltiazem (CARDIZEM CD) 120 MG 24 hr capsule Take 1 capsule (120 mg total) by mouth daily.    ferrous sulfate 325 (65 FE) MG tablet Take 325 mg by mouth daily with breakfast.    furosemide (LASIX) 40 MG tablet Take 0.5 tablets (20 mg total) by mouth daily. 12/11/2020: When wearing compression hose decrease to every other day usage per Dr Ellyn Hack 12/11/20   guaiFENesin (MUCINEX) 600 MG 12 hr tablet Take by mouth 2 (two) times daily. (Patient not taking: Reported on 03/26/2021)    isosorbide mononitrate (IMDUR) 60 MG 24 hr tablet TAKE 1 TABLET ONCE DAILY AFTER BREAKFAST.and May take an additional 60 mg in the evening for 2 days   if you use Nitroglycerin sublingual tablets 12/11/2020: Decrease to 30 mg for short period   per Dr Ellyn Hack 12/11/20   loperamide (IMODIUM A-D) 2 MG tablet Take 2 mg by mouth 4 (four) times daily as needed for  diarrhea or loose stools.  (Patient not taking: Reported on 03/26/2021)    Multiple Vitamin (MULTIVITAMIN PO) Take 1 tablet by mouth daily.     nitroGLYCERIN (NITROSTAT) 0.4 MG SL tablet ONE TABLET UNDER TONGUE WHEN NEEDED FOR CHEST PAIN. MAY REPEAT IN 5 MINUTES. 03/19/2021: Patient has on hand if needed   rosuvastatin (CRESTOR) 20 MG tablet TAKE (1) TABLET DAILY AT BEDTIME. (Patient not taking: Reported on 03/26/2021) 12/11/2020: On hold  per Dr Ellyn Hack 12/11/20   topiramate (TOPAMAX) 100 MG tablet Take 1.5 tablets (150 mg total) by mouth daily.    traMADol (ULTRAM) 50 MG tablet Take 1 tablet (50 mg total) by mouth every 8 (eight) hours as needed for up to 5 days.    vitamin B-12 (CYANOCOBALAMIN) 1000 MCG tablet Take 1,000 mcg by mouth daily.    No facility-administered encounter medications on file as of 03/26/2021.   Reviewed chart prior to disease state call. Spoke with patient regarding BP  Recent Office Vitals: BP Readings from Last 3 Encounters:  03/26/21 (!) 150/90  03/19/21 136/78  02/27/21 (!) 162/87   Pulse Readings from Last 3 Encounters:  03/26/21 75  03/19/21 73  02/27/21 72    Wt Readings from Last 3 Encounters:  03/26/21 181 lb 6.1 oz (82.3 kg)  03/19/21 184 lb 6.4 oz (83.6 kg)  02/25/21 186 lb 9.6 oz (84.6 kg)  Kidney Function Lab Results  Component Value Date/Time   CREATININE 2.8 (A) 11/15/2020 12:00 AM   CREATININE 2.97 (H) 07/19/2020 02:01 PM   CREATININE 2.45 (H) 04/07/2020 06:39 PM   CREATININE 2.51 (H) 01/10/2020 03:51 PM   CREATININE 2.04 (H) 06/10/2016 11:14 AM   GFR 26.76 (L) 09/12/2019 11:38 AM   GFRNONAA 21 11/15/2020 12:00 AM   GFRNONAA 18 (L) 07/19/2020 02:01 PM   GFRAA 21 11/15/2020 12:00 AM   GFRAA 21 (L) 07/19/2020 02:01 PM    BMP Latest Ref Rng & Units 11/15/2020 07/19/2020 04/07/2020  Glucose 65 - 99 mg/dL - 115(H) 107(H)  BUN 4 - 21 49(A) 56(H) 52(H)  Creatinine 0.6 - 1.3 2.8(A) 2.97(H) 2.45(H)  BUN/Creat Ratio 6 - 22 (calc) - 19 -  Sodium  137 - 147 142 141 140  Potassium 3.4 - 5.3 4.7 4.9 4.9  Chloride 99 - 108 109(A) 111(H) 112(H)  CO2 20 - 32 mmol/L - 22 18(L)  Calcium 8.7 - 10.7 8.7 8.3(L) 8.5(L)    Current antihypertensive regimen:  Diltiazem 120 mg daily Imdur 60 mg daily  How often are you checking your Blood Pressure? infrequently  Current home BP readings: none to report  What recent interventions/DTPs have been made by any provider to improve Blood Pressure control since last CPP Visit: no interventions. Patient did stop taking Rosuvastatin due to fatigue and muscle pains. He states he feels much better since he stopped taking it. He did not restart the medication.  Any recent hospitalizations or ED visits since last visit with CPP? No  What diet changes have been made to improve Blood Pressure Control?  None, patient states he is not currently dieting.  What exercise is being done to improve your Blood Pressure Control?  None, patient states he is not currently exercising.  Adherence Review: Is the patient currently on ACE/ARB medication? No Does the patient have >5 day gap between last estimated fill dates? No   Future Appointments  Date Time Provider Divide  04/10/2021  9:00 AM [facility] None  04/10/2021  9:30 AM Wyatt Portela, MD [facility] None  04/10/2021 10:00 AM [facility] [place] [facility] None  04/15/2021 10:30 AM [facility] PHARMACIST [facility]  05/01/2021  4:20 PM [name], MD [facility]  06/16/2021  9:10 AM Pieter Partridge, DO [facility] None  09/19/2021  4:20 PM [name], MD [facility]  11/27/2021  8:00 AM [facility] HEALTH COACH [facility]    Star Rating Drugs: Rosuvastatin 20 mg last filled 12/06/2020 90 DS  April D Calhoun, [place] Pharmacist Assistant [phone]   10 minutes spent in review, coordination, and documentation.  Reviewed by: Beverly Milch, PharmD Clinical Pharmacist [phone]

## 2021-03-26 NOTE — Patient Instructions (Signed)
It was great to see you!  For your headaches  -Referral to neurology -Continue topamax 150 mg daily -May use tramadol 50 mg as needed up to 3 times daily (this was sent in)  For your urine -Urine sample today and send off for culture -- I will be in touch with results -Please follow-up with [facility] (Dr. Junious Silk)   Headache Precautions: Contact a doctor if: Your symptoms are not helped by medicine. You have a headache that feels different than the other headaches. You feel sick to your stomach (nauseous) or you throw up (vomit). You have a fever. Get help right away if: Your headache gets very bad quickly. Your headache gets worse after a lot of physical activity. You keep throwing up. You have a stiff neck. You have trouble seeing. You have trouble speaking. You have pain in the eye or ear. Your muscles are weak or you lose muscle control. You lose your balance or have trouble walking. You feel like you will pass out (faint) or you pass out. You are mixed up (confused). You have a seizure.   Take care,  Inda Coke PA-C
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
## 2021-03-27 ENCOUNTER — Encounter: Payer: Self-pay | Admitting: Neurology

## 2021-03-27 DIAGNOSIS — R310 Gross hematuria: Secondary | ICD-10-CM | POA: Diagnosis not present

## 2021-03-27 DIAGNOSIS — R350 Frequency of micturition: Secondary | ICD-10-CM | POA: Diagnosis not present

## 2021-03-27 LAB — URINE CULTURE
MICRO NUMBER:: 12255061
SPECIMEN QUALITY:: ADEQUATE

## 2021-04-10 ENCOUNTER — Inpatient Hospital Stay: Payer: Medicare Other

## 2021-04-10 ENCOUNTER — Other Ambulatory Visit: Payer: Self-pay

## 2021-04-10 ENCOUNTER — Inpatient Hospital Stay: Payer: Medicare Other | Attending: Oncology | Admitting: Oncology

## 2021-04-10 VITALS — BP 148/77 | HR 82 | Temp 98.2°F | Resp 18 | Wt 180.3 lb

## 2021-04-10 DIAGNOSIS — D649 Anemia, unspecified: Secondary | ICD-10-CM | POA: Diagnosis not present

## 2021-04-10 DIAGNOSIS — D539 Nutritional anemia, unspecified: Secondary | ICD-10-CM | POA: Diagnosis not present

## 2021-04-10 DIAGNOSIS — D518 Other vitamin B12 deficiency anemias: Secondary | ICD-10-CM

## 2021-04-10 LAB — CBC WITH DIFFERENTIAL (CANCER CENTER ONLY)
Abs Immature Granulocytes: 0.04 10*3/uL (ref 0.00–0.07)
Basophils Absolute: 0.1 10*3/uL (ref 0.0–0.1)
Basophils Relative: 1 %
Eosinophils Absolute: 0.1 10*3/uL (ref 0.0–0.5)
Eosinophils Relative: 1 %
HCT: 37.1 % — ABNORMAL LOW (ref 39.0–52.0)
Hemoglobin: 12.2 g/dL — ABNORMAL LOW (ref 13.0–17.0)
Immature Granulocytes: 0 %
Lymphocytes Relative: 10 %
Lymphs Abs: 1.1 10*3/uL (ref 0.7–4.0)
MCH: 32.9 pg (ref 26.0–34.0)
MCHC: 32.9 g/dL (ref 30.0–36.0)
MCV: 100 fL (ref 80.0–100.0)
Monocytes Absolute: 0.9 10*3/uL (ref 0.1–1.0)
Monocytes Relative: 8 %
Neutro Abs: 8.7 10*3/uL — ABNORMAL HIGH (ref 1.7–7.7)
Neutrophils Relative %: 80 %
Platelet Count: 109 10*3/uL — ABNORMAL LOW (ref 150–400)
RBC: 3.71 MIL/uL — ABNORMAL LOW (ref 4.22–5.81)
RDW: 12.7 % (ref 11.5–15.5)
WBC Count: 11 10*3/uL — ABNORMAL HIGH (ref 4.0–10.5)
nRBC: 0 % (ref 0.0–0.2)

## 2021-04-10 NOTE — Progress Notes (Signed)
Hematology and Oncology Follow Up Visit  Mark Jackson [id number] Feb 08, 1930 87 y.o. 04/10/2021 8:39 AM Mark Jackson, MDHunter, [name], MD   Principle Diagnosis: 87 year old man with macrocytic anemia diagnosed in April 2022.  Work-up has been unrevealing for a plasma cell disorder, N00 or folic acid deficiency.     Current therapy: Active surveillance.  Interim History: Mark Jackson presents today for a follow-up visit.  He reports no major changes in his health.  He denies any recent hospitalizations or illnesses.  He denies any excessive fatigue tiredness weakness.  He denies any active bleeding.  Denied hematochezia or melena.  He did report mild episodes of hematuria that has resolved.     Medications: I have reviewed the patient's current medications.  Current Outpatient Medications  Medication Sig Dispense Refill   acetaminophen (TYLENOL) 325 MG tablet Take 325-650 mg by mouth every 6 (six) hours as needed for moderate pain or headache.      amoxicillin (AMOXIL) 500 MG tablet Take 2,000 mg (4 capsules) 1 hour prior to all dental visits. 8 tablet 11   apixaban (ELIQUIS) 2.5 MG TABS tablet TAKE 1 TABLET BY MOUTH TWICE DAILY. 180 tablet 1   diltiazem (CARDIZEM CD) 120 MG 24 hr capsule Take 1 capsule (120 mg total) by mouth daily. 90 capsule 3   ferrous sulfate 325 (65 FE) MG tablet Take 325 mg by mouth daily with breakfast.     furosemide (LASIX) 40 MG tablet Take 0.5 tablets (20 mg total) by mouth daily.     guaiFENesin (MUCINEX) 600 MG 12 hr tablet Take by mouth 2 (two) times daily. (Patient not taking: Reported on 03/26/2021)     isosorbide mononitrate (IMDUR) 60 MG 24 hr tablet TAKE 1 TABLET ONCE DAILY AFTER BREAKFAST.and May take an additional 60 mg in the evening for 2 days   if you use Nitroglycerin sublingual tablets 120 tablet 3   loperamide (IMODIUM A-D) 2 MG tablet Take 2 mg by mouth 4 (four) times daily as needed for diarrhea or loose stools.  (Patient not taking:  Reported on 03/26/2021)     Multiple Vitamin (MULTIVITAMIN PO) Take 1 tablet by mouth daily.      nitroGLYCERIN (NITROSTAT) 0.4 MG SL tablet ONE TABLET UNDER TONGUE WHEN NEEDED FOR CHEST PAIN. MAY REPEAT IN 5 MINUTES. 25 tablet 0   rosuvastatin (CRESTOR) 20 MG tablet TAKE (1) TABLET DAILY AT BEDTIME. (Patient not taking: Reported on 03/26/2021) 90 tablet 1   topiramate (TOPAMAX) 100 MG tablet Take 1.5 tablets (150 mg total) by mouth daily. 135 tablet 3   vitamin B-12 (CYANOCOBALAMIN) 1000 MCG tablet Take 1,000 mcg by mouth daily.     No current facility-administered medications for this visit.     Allergies: No Known Allergies    Physical Exam: Blood pressure (!) 148/77, pulse 82, temperature 98.2 F (36.8 C), resp. rate 18, weight 180 lb 4.8 oz (81.8 kg), SpO2 100 %.  ECOG: 1    General appearance: Comfortable appearing without any discomfort Head: Normocephalic without any trauma Oropharynx: Mucous membranes are moist and pink without any thrush or ulcers. Eyes: Pupils are equal and round reactive to light. Lymph nodes: No cervical, supraclavicular, inguinal or axillary lymphadenopathy.   Heart:regular rate and rhythm.  S1 and S2 without leg edema. Lung: Clear without any rhonchi or wheezes.  No dullness to percussion. Abdomin: Soft, nontender, nondistended with good bowel sounds.  No hepatosplenomegaly. Musculoskeletal: No joint deformity or effusion.  Full range of  motion noted. Neurological: No deficits noted on motor, sensory and deep tendon reflex exam. Skin: No petechial rash or dryness.  Appeared moist.     Lab Results: Lab Results  Component Value Date   WBC 11.9 (H) 03/20/2021   HGB 12.4 (L) 03/20/2021   HCT 37.8 (L) 03/20/2021   MCV 101.1 (H) 03/20/2021   PLT 78 (L) 03/20/2021     Chemistry      Component Value Date/Time   NA 142 11/15/2020 0000   K 4.7 11/15/2020 0000   CL 109 (A) 11/15/2020 0000   CO2 22 07/19/2020 1401   BUN 49 (A) 11/15/2020 0000    CREATININE 2.8 (A) 11/15/2020 0000   CREATININE 2.97 (H) 07/19/2020 1401   GLU 96 11/15/2020 0000      Component Value Date/Time   CALCIUM 8.7 11/15/2020 0000   ALKPHOS 54 12/08/2019 0949   AST 18 07/19/2020 1401   ALT 9 07/19/2020 1401   BILITOT 0.4 07/19/2020 1401         Impression and Plan:   87 year old with:  1.  Macrocytic anemia diagnosed in April 2022.  His hemoglobin has fluctuated between 10 and 12 with MCV around 101.  His work-up was unrevealing for vitamin deficiency or plasma cell disorder.  The differential diagnosis and management options were reviewed at this time.  His hemoglobin in August 11 was 12.4 and does not require intervention.  Myelodysplastic syndrome is a distinct possibility but at this time no intervention is needed given his mild degree of anemia.  He does not require any growth factor support or transfusion.  The role for bone marrow biopsy was discussed at this time.  This will help solidify the diagnosis although will not alter management at this time.    Laboratory data from today reviewed showed a hemoglobin of 12.2 without any specific symptoms.  After discussion today, I recommended continued active surveillance.  After discussion today, he opted to up with active surveillance and will reevaluate in 6 months.  2.  Thrombocytopenia: His count has fluctuated as high as 116 to 78.  No active bleeding is noted.  This could also be due to MDS.  His platelet count today is 109 without any active bleeding.   3.  Follow-up: In 6 months for repeat follow-up.   30  minutes were dedicated to this visit. The time was spent on updating his disease status, reviewing laboratory data results, reviewing management choices and the differential diagnosis.   Mark Button, MD 9/1/20228:39 AM

## 2021-04-11 ENCOUNTER — Telehealth: Payer: Self-pay | Admitting: Oncology

## 2021-04-11 NOTE — Telephone Encounter (Signed)
Left message with follow-up appointment per 9/1 los. 

## 2021-04-11 NOTE — Progress Notes (Signed)
Chronic Care Management Pharmacy Note  04/16/2021 Name:  Mark Jackson. MRN:  [id number] DOB:  09/11/1929  Summary: PharmD follow up.  Verified doses based on declined renal function.  All meds appropriate at this time.  Recommendations/Changes made from today's visit: Due for updated routine labs  Plan: FU 6 months   Subjective: Mark Jackson. is an 87 y.o. year old male who is a primary patient of Hunter, [name], MD.  The CCM team was consulted for assistance with disease management and care coordination needs.    Engaged with patient by telephone for follow up visit in response to provider referral for pharmacy case management and/or care coordination services.   Consent to Services:  The patient was given the following information about Chronic Care Management services today, agreed to services, and gave verbal consent: 1. CCM service includes personalized support from designated clinical staff supervised by the primary care provider, including individualized plan of care and coordination with other care providers 2. 24/7 contact phone numbers for assistance for urgent and routine care needs. 3. Service will only be billed when office clinical staff spend 20 minutes or more in a month to coordinate care. 4. Only one practitioner may furnish and bill the service in a calendar month. 5.The patient may stop CCM services at any time (effective at the end of the month) by phone call to the office staff. 6. The patient will be responsible for cost sharing (co-pay) of up to 20% of the service fee (after annual deductible is met). Patient agreed to services and consent obtained.  Patient Care Team: [name], MD as PCP - General (Family Medicine) [name], MD as PCP - Cardiology (Cardiology) Lorretta Harp, MD as Consulting Physician (Cardiology) Corliss Parish, MD as Consulting Physician (Nephrology) Lafayette Dragon, MD (Inactive) as Consulting Physician  (Gastroenterology) Love, Alyson Locket, MD as Consulting Physician (Neurology) Edythe Clarity, [facility] (Pharmacist)  Recent office visits:  03/26/2021 Sherle Poe, Aldona Bar, PA; Keflex 500 mg BID for possible cystitis and Tramadol 50 mg TID prn for new daily persistent headache    Recent consult visits:  03/19/2021 OV (cardiology) [name], MD; two week statin holiday due to fatigue and muscle aches. If symptoms do improve, he can restart rosuvastatin half tablet dose (10 mg).   Hospital visits:  None in previous 6 months   Objective:  Lab Results  Component Value Date   CREATININE 2.8 (A) 11/15/2020   BUN 49 (A) 11/15/2020   GFR 26.76 (L) 09/12/2019   GFRNONAA 21 11/15/2020   GFRAA 21 11/15/2020   NA 142 11/15/2020   K 4.7 11/15/2020   CALCIUM 8.7 11/15/2020   CO2 22 07/19/2020   GLUCOSE 115 (H) 07/19/2020    Lab Results  Component Value Date/Time   HGBA1C 5.6 07/19/2020 02:01 PM   HGBA1C 6.1 (H) 12/08/2019 09:49 AM   GFR 26.76 (L) 09/12/2019 11:38 AM   GFR 25.51 (L) 03/16/2019 12:05 PM    Last diabetic Eye exam: No results found for: HMDIABEYEEXA  Last diabetic Foot exam: No results found for: HMDIABFOOTEX   Lab Results  Component Value Date   CHOL 91 11/29/2019   HDL 35 (L) 11/29/2019   LDLCALC 46 11/29/2019   LDLDIRECT 52.0 09/12/2018   TRIG 49 11/29/2019   CHOLHDL 2.6 11/29/2019    Hepatic Function Latest Ref Rng & Units 11/15/2020 07/19/2020 12/08/2019  Total Protein 6.1 - 8.1 g/dL - 6.0(L) 6.6  Albumin  3.5 - 5.0 3.6 - 3.5  AST 10 - 35 U/L - 18 17  ALT 9 - 46 U/L - 9 11  Alk Phosphatase 38 - 126 U/L - - 54  Total Bilirubin 0.2 - 1.2 mg/dL - 0.4 0.4  Bilirubin, Direct 0.0 - 0.3 mg/dL - - -    Lab Results  Component Value Date/Time   TSH 3.970 12/11/2020 12:33 PM   TSH 2.98 07/19/2020 02:01 PM    CBC Latest Ref Rng & Units 04/10/2021 03/20/2021 02/27/2021  WBC 4.0 - 10.5 K/uL 11.0(H) 11.9(H) 12.3(H)  Hemoglobin 13.0 - 17.0 g/dL 12.2(L) 12.4(L) 10.6(L)   Hematocrit 39.0 - 52.0 % 37.1(L) 37.8(L) 32.2(L)  Platelets 150 - 400 K/uL 109(L) 78(L) 91(L)    No results found for: VD25OH  Clinical ASCVD: Yes  The ASCVD Risk score Mikey Bussing DC Jr., et al., 2013) failed to calculate for the following reasons:   The 2013 ASCVD risk score is only valid for ages 72 to 77    Depression screen PHQ 2/9 01/28/2021 11/21/2020 03/22/2020  Decreased Interest 0 0 0  Down, Depressed, Hopeless 0 1 0  PHQ - 2 Score 0 1 0  Some recent data might be hidden     Social History   Tobacco Use  Smoking Status Former   Types: Pipe, Cigars   Quit date: 08/10/1968   Years since quitting: 52.7  Smokeless Tobacco Never   BP Readings from Last 3 Encounters:  04/10/21 (!) 148/77  03/26/21 (!) 150/90  03/19/21 136/78   Pulse Readings from Last 3 Encounters:  04/10/21 82  03/26/21 75  03/19/21 73   Wt Readings from Last 3 Encounters:  04/10/21 180 lb 4.8 oz (81.8 kg)  03/26/21 181 lb 6.1 oz (82.3 kg)  03/19/21 184 lb 6.4 oz (83.6 kg)   BMI Readings from Last 3 Encounters:  04/10/21 27.41 kg/m  03/26/21 27.58 kg/m  03/19/21 28.04 kg/m    Assessment/Interventions: Review of patient past medical history, allergies, medications, health status, including review of consultants reports, laboratory and other test data, was performed as part of comprehensive evaluation and provision of chronic care management services.   SDOH:  (Social Determinants of Health) assessments and interventions performed: Yes  Financial Resource Strain: Low Risk    Difficulty of Paying Living Expenses: Not hard at all    SDOH Screenings   Alcohol Screen: Not on file  Depression (PHQ2-9): Low Risk    PHQ-2 Score: 0  Financial Resource Strain: Low Risk    Difficulty of Paying Living Expenses: Not hard at all  Food Insecurity: No Food Insecurity   Worried About Charity fundraiser in the Last Year: Never true   Ran Out of Food in the Last Year: Never true  Housing: Low Risk     Last Housing Risk Score: 0  Physical Activity: Inactive   Days of Exercise per Week: 0 days   Minutes of Exercise per Session: 0 min  Social Connections: Moderately Integrated   Frequency of Communication with Friends and Family: More than three times a week   Frequency of Social Gatherings with Friends and Family: More than three times a week   Attends Religious Services: More than 4 times per year   Active Member of [employer] or Organizations: Yes   Attends Archivist Meetings: 1 to 4 times per year   Marital Status: Widowed  Stress: No Stress Concern Present   Feeling of Stress : Only a little  Tobacco Use:  Medium Risk   Smoking Tobacco Use: Former   Smokeless Tobacco Use: Never  Transportation Needs: No Data processing manager (Medical): No   Lack of Transportation (Non-Medical): No    CCM Care Plan  No Known Allergies  Medications Reviewed Today     Reviewed by Edythe Clarity, [facility] (Pharmacist) on 04/16/21 at 0909  Med List Status: <None>   Medication Order Taking? Sig Documenting Provider Last Dose Status Informant  acetaminophen (TYLENOL) 325 MG tablet [id number] Yes Take 325-650 mg by mouth every 6 (six) hours as needed for moderate pain or headache.  [provider] Taking Active Self  amoxicillin (AMOXIL) 500 MG tablet [id number] Yes Take 2,000 mg (4 capsules) 1 hour prior to all dental visits. Eileen Stanford, PA-C Taking Active   apixaban (ELIQUIS) 2.5 MG TABS tablet [id number] Yes TAKE 1 TABLET BY MOUTH TWICE DAILY. [name], MD Taking Active   diltiazem [facility] CD) 120 MG 24 hr capsule [id number] Yes Take 1 capsule (120 mg total) by mouth daily. [name], MD Taking Active   ferrous sulfate 325 (65 FE) MG tablet [id number] Yes Take 325 mg by mouth daily with breakfast. [provider] Taking Active Self  furosemide (LASIX) 40 MG tablet [id number] Yes Take 0.5 tablets (20 mg total) by mouth daily. Barton Dubois, MD Taking Active Self           Med Note Josefine Class Dec 11, 2020 12:25 PM)  When wearing compression hose decrease to every other day usage per Dr Ellyn Hack 12/11/20  guaiFENesin (MUCINEX) 600 MG 12 hr tablet [id number] Yes Take by mouth 2 (two) times daily. [provider] Taking Active   isosorbide mononitrate (IMDUR) 60 MG 24 hr tablet [id number] Yes TAKE 1 TABLET ONCE DAILY AFTER BREAKFAST.and May take an additional 60 mg in the evening for 2 days   if you use Nitroglycerin sublingual tablets [name], MD Taking Active            Med Note Josefine Class Dec 11, 2020 12:24 PM) Decrease to 30 mg for short period   per Dr Ellyn Hack 12/11/20  loperamide (IMODIUM A-D) 2 MG tablet [id number] Yes Take 2 mg by mouth 4 (four) times daily as needed for diarrhea or loose stools. [provider] Taking Active   Multiple Vitamin (MULTIVITAMIN PO) [id number] Yes Take 1 tablet by mouth daily.  [provider] Taking Active Self  nitroGLYCERIN (NITROSTAT) 0.4 MG SL tablet [id number] Yes ONE TABLET UNDER TONGUE WHEN NEEDED FOR CHEST PAIN. MAY REPEAT IN 5 MINUTES. [name], MD Taking Active            Med Note Netta Neat, Hermina Staggers   Wed Mar 19, 2021  2:22 PM) Patient has on hand if needed  rosuvastatin (CRESTOR) 20 MG tablet [id number] Yes TAKE (1) TABLET DAILY AT BEDTIME. [name], MD Taking Active            Med Note Josefine Class Dec 11, 2020 12:24 PM)  On hold  per Dr Ellyn Hack 12/11/20  topiramate (TOPAMAX) 100 MG tablet [id number] Yes Take 1.5 tablets (150 mg total) by mouth daily. Inda Coke, [place] Taking Active   vitamin B-12 (CYANOCOBALAMIN) 1000 MCG tablet [id number] Yes Take 1,000 mcg by mouth daily. [provider] Taking Active             Patient Active Problem List  Diagnosis Date Noted   Hematuria 12/11/2020   Insomnia, psychophysiological 02/28/2020   Severe aortic stenosis 12/12/2019   Chronic diastolic  heart failure ([place]) 12/12/2019   S/P TAVR (transcatheter aortic valve replacement) 12/12/2019   Syncope and collapse 11/27/2019   Fatigue 11/08/2017   Hyperglycemia 09/27/2017   Bilateral lower extremity edema 04/15/2017   B12 deficiency 01/06/2017   Chronic diarrhea 01/06/2017   BPH associated with nocturia 06/15/2016   Perianal dermatitis 06/19/2015   Rectal bleeding 04/25/2015   CAD S/P DES PCI to proximal LAD 03/22/2015   Long term current use of anticoagulant therapy 08/27/2014   Paroxysmal atrial fibrillation ([place]); CHA2DSVasc - 4; Now on Eliquis 08/20/2014    Class: Diagnosis of   Chronic kidney disease (CKD), active medical management without dialysis, stage 4 (severe) ([facility]) 08/20/2014   Hereditary and idiopathic peripheral neuropathy 01/12/2014   Obesity (BMI 30-39.9) 09/03/2013   H/O syncope 09/03/2013   Right-sided carotid artery disease; followed by Dr. Trula Slade 03/02/2013   Hyperlipidemia with target LDL less than 70 03/02/2013   Migraine without aura 10/26/2012   Anemia 10/23/2008   GLAUCOMA 10/23/2008   Essential hypertension 10/22/2008   Arthropathy 10/22/2008   Sleep apnea 10/22/2008   Personal history of prostate cancer 10/22/2008   History of colonic polyps 10/22/2008    Immunization History  Administered Date(s) Administered   Fluad Quad(high Dose 65+) 05/11/2019, 05/16/2020   Influenza Inj Mdck Quad Pf 05/26/2016   Influenza Split 04/21/2012, 06/24/2014   Influenza,inj,Quad PF,6+ Mos 05/26/2016   Influenza-Unspecified 05/26/2016, 06/10/2017, 05/10/2018   Moderna Sars-Covid-2 Vaccination 03/14/2020, 04/11/2020   Pneumococcal Conjugate-13 05/15/2014   Pneumococcal Polysaccharide-23 09/28/2016   Pneumococcal-Unspecified 09/28/2016   Tdap 03/02/2019   Zoster Recombinat (Shingrix) 02/11/2018, 05/17/2018   Zoster, Live 08/10/2012    Conditions to be addressed/monitored:  HTN, Afib, HLD  Care Plan : General Pharmacy (Adult)  Updates made by Edythe Clarity, RPH since 04/16/2021 12:00 AM     Problem: HTN, HLD, Afib   Priority: High  Onset Date: 04/15/2021     Long-Range Goal: Patient-Specific Goal   Start Date: 04/15/2021  Expected End Date: 10/14/2021  This Visit's Progress: On track  Priority: High  Note:   Current Barriers:  Unable to achieve control of BP   Pharmacist Clinical Goal(s):  Patient will achieve adherence to monitoring guidelines and medication adherence to achieve therapeutic efficacy achieve control of BP as evidenced by monitoring contact provider office for questions/concerns as evidenced notation of same in electronic health record through collaboration with PharmD and provider.   Interventions: 1:1 collaboration with [name], MD regarding development and update of comprehensive plan of care as evidenced by provider attestation and co-signature Inter-disciplinary care team collaboration (see longitudinal plan of care) Comprehensive medication review performed; medication list updated in electronic medical record  Hypertension (BP goal <140/90) -Uncontrolled based on office BP -Current treatment: Isosorbide 43m 24hr daily Diltiazem CD 1242mdaily -Medications previously tried: Metoprolol XL  -Current home readings: not checking at home  -Reports hypotensive/hypertensive symptoms - daily headaches (migraines) -Educated on BP goals and benefits of medications for prevention of heart attack, stroke and kidney damage; Importance of home blood pressure monitoring; Symptoms of hypotension and importance of maintaining adequate hydration; -Counseled to monitor BP at home as able, document, and provide log at future appointments -Recommended to continue current medication  Hyperlipidemia: (LDL goal < 70) -Controlled -Current treatment: Rosuvastatin 2062maily -Medications previously tried: none noted  -Most recent LDL is excellent, due  for re check -Educated on Cholesterol goals;  Benefits of  statin for ASCVD risk reduction; Importance of limiting foods high in cholesterol; -Recommended to continue current medication  Atrial Fibrillation (Goal: prevent stroke and major bleeding) -Controlled -CHADSVASC: 4 -Current treatment: Rate control: Diltiazem CD 170m 24hr daily Anticoagulation: Eliquis 2.55mBID -Medications previously tried: none noted -Home BP and HR readings: not checking at home  -Counseled on increased risk of stroke due to Afib and benefits of anticoagulation for stroke prevention; bleeding risk associated with Eliquis and importance of self-monitoring for signs/symptoms of bleeding; -He is on appropriate dose of Eliquis due to age and Scr. -Recommend f/u on labs at next visit with Dr. HuYong Channel-Recommended to continue current medication  Patient Goals/Self-Care Activities Patient will:  - take medications as prescribed focus on medication adherence by pill box check blood pressure as able, document, and provide at future appointments  Follow Up Plan: The care management team will reach out to the patient again over the next 180 days.         Medication Assistance: None required.  Patient affirms current coverage meets needs.  Compliance/Adherence/Medication fill history: Care Gaps: None identified  Star-Rating Drugs: Rosuvastatin 2075m4/29/22 90ds?  Patient's preferred pharmacy is:  [address] [id number]: [phone]: [phone] [id number]: [phone]: [phone] discussed: Benefits of medication synchronization, packaging and delivery as well as enhanced pharmacist oversight with [employer]. Patient decided to: Continue current medication management strategy  Care Plan and Follow Up Patient Decision:  Patient agrees to Care Plan and  Follow-up.  Plan: The care management team will reach out to the patient again over the next 180 days.  ChrBeverly MilchharmD Clinical Pharmacist [phone]

## 2021-04-15 ENCOUNTER — Ambulatory Visit (INDEPENDENT_AMBULATORY_CARE_PROVIDER_SITE_OTHER): Payer: Medicare Other | Admitting: Pharmacist

## 2021-04-15 DIAGNOSIS — I10 Essential (primary) hypertension: Secondary | ICD-10-CM

## 2021-04-15 DIAGNOSIS — E785 Hyperlipidemia, unspecified: Secondary | ICD-10-CM

## 2021-04-15 DIAGNOSIS — I480 Paroxysmal atrial fibrillation: Secondary | ICD-10-CM

## 2021-04-16 NOTE — Patient Instructions (Addendum)
Visit Information   Goals Addressed             This Visit's Progress    Track and Manage My Blood Pressure-Hypertension       Timeframe:  Long-Range Goal Priority:  High Start Date:   04/15/21                          Expected End Date:  10/13/20                     Follow Up Date 07/15/21    - check blood pressure weekly - choose a place to take my blood pressure (home, clinic or office, retail store)    Why is this important?   You won't feel high blood pressure, but it can still hurt your blood vessels.  High blood pressure can cause heart or kidney problems. It can also cause a stroke.  Making lifestyle changes like losing a little weight or eating less salt will help.  Checking your blood pressure at home and at different times of the day can help to control blood pressure.  If the doctor prescribes medicine remember to take it the way the doctor ordered.  Call the office if you cannot afford the medicine or if there are questions about it.     Notes:        Patient Care Plan: General Pharmacy (Adult)     Problem Identified: HTN, HLD, Afib   Priority: High  Onset Date: 04/15/2021     Long-Range Goal: Patient-Specific Goal   Start Date: 04/15/2021  Expected End Date: 10/14/2021  This Visit's Progress: On track  Priority: High  Note:   Current Barriers:  Unable to achieve control of BP   Pharmacist Clinical Goal(s):  Patient will achieve adherence to monitoring guidelines and medication adherence to achieve therapeutic efficacy achieve control of BP as evidenced by monitoring contact provider office for questions/concerns as evidenced notation of same in electronic health record through collaboration with PharmD and provider.   Interventions: 1:1 collaboration with [name], MD regarding development and update of comprehensive plan of care as evidenced by provider attestation and co-signature Inter-disciplinary care team collaboration (see longitudinal  plan of care) Comprehensive medication review performed; medication list updated in electronic medical record  Hypertension (BP goal <140/90) -Uncontrolled based on office BP -Current treatment: Isosorbide 60mg  24hr daily Diltiazem CD 120mg  daily -Medications previously tried: Metoprolol XL  -Current home readings: not checking at home  -Reports hypotensive/hypertensive symptoms - daily headaches (migraines) -Educated on BP goals and benefits of medications for prevention of heart attack, stroke and kidney damage; Importance of home blood pressure monitoring; Symptoms of hypotension and importance of maintaining adequate hydration; -Counseled to monitor BP at home as able, document, and provide log at future appointments -Recommended to continue current medication  Hyperlipidemia: (LDL goal < 70) -Controlled -Current treatment: Rosuvastatin 20mg  daily -Medications previously tried: none noted  -Most recent LDL is excellent, due for re check -Educated on Cholesterol goals;  Benefits of statin for ASCVD risk reduction; Importance of limiting foods high in cholesterol; -Recommended to continue current medication  Atrial Fibrillation (Goal: prevent stroke and major bleeding) -Controlled -CHADSVASC: 4 -Current treatment: Rate control: Diltiazem CD 120mg  24hr daily Anticoagulation: Eliquis 2.5mg  BID -Medications previously tried: none noted -Home BP and HR readings: not checking at home  -Counseled on increased risk of stroke due to Afib and benefits of anticoagulation for stroke  prevention; bleeding risk associated with Eliquis and importance of self-monitoring for signs/symptoms of bleeding; -He is on appropriate dose of Eliquis due to age and Scr. -Recommend f/u on labs at next visit with Dr. [name]. -Recommended to continue current medication  Patient Goals/Self-Care Activities Patient will:  - take medications as prescribed focus on medication adherence by pill box check  blood pressure as able, document, and provide at future appointments  Follow Up Plan: The care management team will reach out to the patient again over the next 180 days.         Patient verbalizes understanding of instructions provided today and agrees to view in [place].  Telephone follow up appointment with pharmacy team member scheduled for: 6 months  Edythe Clarity, [place]

## 2021-04-22 ENCOUNTER — Telehealth: Payer: Self-pay | Admitting: Family Medicine

## 2021-04-22 NOTE — Telephone Encounter (Signed)
Received documentation from [facility].  Patient had facet injection on November 11, 2017 by Dr. Nelva Bush.  He had a right and left L4-L5 facet injection and L5-S1  There is also a note from Dr. Cleophus Molt that mentions a prior epidural steroid injection but there is no specificity.  Additionally no MRI lumbar spine included.  More records requested.

## 2021-05-01 ENCOUNTER — Encounter: Payer: Self-pay | Admitting: Family Medicine

## 2021-05-01 ENCOUNTER — Other Ambulatory Visit: Payer: Self-pay

## 2021-05-01 ENCOUNTER — Ambulatory Visit (INDEPENDENT_AMBULATORY_CARE_PROVIDER_SITE_OTHER): Payer: Medicare Other | Admitting: Family Medicine

## 2021-05-01 VITALS — BP 128/78 | HR 80 | Temp 98.7°F | Ht 68.0 in | Wt 183.4 lb

## 2021-05-01 DIAGNOSIS — I2510 Atherosclerotic heart disease of native coronary artery without angina pectoris: Secondary | ICD-10-CM

## 2021-05-01 DIAGNOSIS — Z9861 Coronary angioplasty status: Secondary | ICD-10-CM

## 2021-05-01 DIAGNOSIS — G4719 Other hypersomnia: Secondary | ICD-10-CM | POA: Diagnosis not present

## 2021-05-01 DIAGNOSIS — I10 Essential (primary) hypertension: Secondary | ICD-10-CM | POA: Diagnosis not present

## 2021-05-01 DIAGNOSIS — I480 Paroxysmal atrial fibrillation: Secondary | ICD-10-CM

## 2021-05-01 DIAGNOSIS — N184 Chronic kidney disease, stage 4 (severe): Secondary | ICD-10-CM

## 2021-05-01 DIAGNOSIS — E785 Hyperlipidemia, unspecified: Secondary | ICD-10-CM

## 2021-05-01 DIAGNOSIS — G43009 Migraine without aura, not intractable, without status migrainosus: Secondary | ICD-10-CM

## 2021-05-01 NOTE — Progress Notes (Signed)
Phone [phone] In person visit   Subjective:   Mark Jackson. is a 87 y.o. year old very pleasant male patient who presents for/with See problem oriented charting Chief Complaint  Patient presents with   Hypertension   Hyperlipidemia   Atrial Fibrillation   Chronic Kidney Disease   This visit occurred during the SARS-CoV-2 public health emergency.  Safety protocols were in place, including screening questions prior to the visit, additional usage of staff PPE, and extensive cleaning of exam room while observing appropriate contact time as indicated for disinfecting solutions.   Past Medical History-  Patient Active Problem List   Diagnosis Date Noted   Chronic diastolic heart failure ([place]) 12/12/2019    Priority: High   S/P TAVR (transcatheter aortic valve replacement) 12/12/2019    Priority: High   CAD S/P DES PCI to proximal LAD 03/22/2015    Priority: High   Paroxysmal atrial fibrillation ([place]); CHA2DSVasc - 4; Now on Eliquis 08/20/2014    Priority: High    Class: Diagnosis of   Chronic kidney disease (CKD), active medical management without dialysis, stage 4 (severe) ([place]) 08/20/2014    Priority: High   Personal history of prostate cancer 10/22/2008    Priority: High   Hyperglycemia 09/27/2017    Priority: Medium   B12 deficiency 01/06/2017    Priority: Medium   BPH associated with nocturia 06/15/2016    Priority: Medium   Hereditary and idiopathic peripheral neuropathy 01/12/2014    Priority: Medium   H/O syncope 09/03/2013    Priority: Medium   Right-sided carotid artery disease; followed by Dr. Trula Slade 03/02/2013    Priority: Medium   Hyperlipidemia with target LDL less than 70 03/02/2013    Priority: Medium   Migraine without aura 10/26/2012    Priority: Medium   Anemia 10/23/2008    Priority: Medium   Essential hypertension 10/22/2008    Priority: Medium   Excessive daytime sleepiness 10/22/2008    Priority: Medium   Fatigue 11/08/2017     Priority: Low   Chronic diarrhea 01/06/2017    Priority: Low   Perianal dermatitis 06/19/2015    Priority: Low   Rectal bleeding 04/25/2015    Priority: Low   Long term current use of anticoagulant therapy 08/27/2014    Priority: Low   Obesity (BMI 30-39.9) 09/03/2013    Priority: Low   GLAUCOMA 10/23/2008    Priority: Low   Arthropathy 10/22/2008    Priority: Low   History of colonic polyps 10/22/2008    Priority: Low   Hematuria 12/11/2020   Insomnia, psychophysiological 02/28/2020   Severe aortic stenosis 12/12/2019   Syncope and collapse 11/27/2019   Bilateral lower extremity edema 04/15/2017    Medications- reviewed and updated Current Outpatient Medications  Medication Sig Dispense Refill   acetaminophen (TYLENOL) 325 MG tablet Take 325-650 mg by mouth every 6 (six) hours as needed for moderate pain or headache.      amoxicillin (AMOXIL) 500 MG tablet Take 2,000 mg (4 capsules) 1 hour prior to all dental visits. 8 tablet 11   apixaban (ELIQUIS) 2.5 MG TABS tablet TAKE 1 TABLET BY MOUTH TWICE DAILY. 180 tablet 1   diltiazem (CARDIZEM CD) 120 MG 24 hr capsule Take 1 capsule (120 mg total) by mouth daily. 90 capsule 3   ferrous sulfate 325 (65 FE) MG tablet Take 325 mg by mouth daily with breakfast.     furosemide (LASIX) 40 MG tablet Take 0.5 tablets (20 mg total) by mouth daily.  guaiFENesin (MUCINEX) 600 MG 12 hr tablet Take by mouth 2 (two) times daily.     isosorbide mononitrate (IMDUR) 60 MG 24 hr tablet TAKE 1 TABLET ONCE DAILY AFTER BREAKFAST.and May take an additional 60 mg in the evening for 2 days   if you use Nitroglycerin sublingual tablets 120 tablet 3   loperamide (IMODIUM A-D) 2 MG tablet Take 2 mg by mouth 4 (four) times daily as needed for diarrhea or loose stools.     Multiple Vitamin (MULTIVITAMIN PO) Take 1 tablet by mouth daily.      nitroGLYCERIN (NITROSTAT) 0.4 MG SL tablet ONE TABLET UNDER TONGUE WHEN NEEDED FOR CHEST PAIN. MAY REPEAT IN 5 MINUTES.  25 tablet 0   rosuvastatin (CRESTOR) 20 MG tablet TAKE (1) TABLET DAILY AT BEDTIME. 90 tablet 1   topiramate (TOPAMAX) 100 MG tablet Take 1.5 tablets (150 mg total) by mouth daily. 135 tablet 3   vitamin B-12 (CYANOCOBALAMIN) 1000 MCG tablet Take 1,000 mcg by mouth daily.     No current facility-administered medications for this visit.     Objective:  BP 128/78   Pulse 80   Temp 98.7 F (37.1 C) (Temporal)   Ht 5\' 8"  (1.727 m)   Wt 183 lb 6.4 oz (83.2 kg)   SpO2 97%   BMI 27.89 kg/m  Gen: NAD, resting comfortably CV: Regular heart rate-regular rhythm. Lungs: CTAB no crackles, wheeze, rhonchi Ext: 1-2+ edema- not on compression stockings Skin: warm, dry     Assessment and Plan   #Chronic fatigue/heavy snoring/excessive daytime sleepiness #orthostatic intolerance and syncopal event related to this S: Today patient reports, on 04/22/21 had a fall after losing consciousness- got out of car and went to go into plant- fell up against the car after passing out then down onto the ground onto his right arm.he did not have prodromal symptoms- immediately passed out and was found on the ground- certainly sounds orthostatic. Bruising/bleeding on right forearm thankfully no other significant injury. Did not hit head- 3 people witnessed it and helped him out.  Moving more slowly with position changes-has not had recurrent falls  Over last 3 months has felt weak overall, some quivering, some mental fog, ongoing balance issues, dizziness with sitting- spells 2-3 times a day just sitting there,also some orthostatic symptoms, headaches in evening- in general feels like he has "gone downhill" but on the other hand symptoms have largely stabilized during this 47-month period-has not noted continued decline.  Still working and doesn't feel overexerted by work. 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  He also reports heavy snoring.  Not  sleeping well- up 4-5 x a night to pee. Tired in AM even though in bed 9 hrs- takes AM nap  Within this time frame we did an MRI of his brain on 02/08/21 "IMPRESSION: 1. No acute intracranial abnormality. 2. Generalized volume loss and findings of chronic ischemic microangiopathy."  Patient was seen on 04/10/2021 by Dr. Venetia Maxon was concern for myelodysplastic syndrome-plan ultimately list for active surveillance due to mild anemia and thrombocytopenia.  He plans to see him in 6 months A/P: Message to Dr. Ellyn Hack with most of thoughts/plan from today  "I saw Mr. Klingerman today. He is having a fair amount of orthostatic intolerance and even had a syncopal event after getting out of his car on the 13th of September- BP was controlled today and I reduced his imdur to 30mg  from  60mg . Also on lasix 20 mg but fair amount of edema so didn't want to decrease that and diltiazem 120mg  XR but HR 80 and I didn't see an easy rx option to reduce that slightly. I just wanted to make sure you were ok with reduction. He has used nitroglycerin twice in past month.   He reports significant fatigue over the last few months in particular.  I sent him to hematology about his anemia as a contributing factor but they only plan to monitor for now.  I do not think his fatigue is cardiac related as it seems persistent and not specifically related to exertion.He also reports snoring heavily and having excessive daytime sleepiness despite being in bed 9 hours each night.  I wanted to test him for sleep apnea and typically my referral goes to [facility] but I wanted to see if you had a preferred option within cardiology instead for testing since he is already established with your group.  He is also getting a fair number of headaches and taking Tylenol 5 times a week.  He was referred to neurology by one of my colleagues.  He is getting some intermittent dizziness perhaps 2-3 times a day as well and I would like for them to  weigh in on that.  I was going to have him get a apple watch to see if heart rate is particularly high or low with these episodes would consider cardiac event monitoring or getting him back into you.  He has also not been compliant with his compression stockings as you advocated for last visit-I strongly encouraged this"  -Syncopal episode when standing up quickly from the [place] consistent with orthostatic etiology for symptoms-not restricting driving at this time and no further cardiac work-up planned at present -Daughter will reinforce importance of wearing compression stockings -Patient will update me with home blood pressures in 2 weeks -If he has recurrent syncopal episode should alert me immediately and not wait over a week -Refer to [facility] unless Dr. Ellyn Hack wants patient to see someone within cardiology for sleep apnea testing  # Atrial fibrillation S: Rate controlled with eliquis 2.5 mg BID Anticoagulated with diltiazem 120 mg XR Patient is  followed by cardiology: Dr. Ellyn Hack  A/P: Appears appropriately rate controlled-consider reducing diltiazem but I do not see a great option plus heart rate above 75 and I worry about potential elevated heart rates contributing to symptoms  #hypertension S: medication: Lasix 40 mg daily- takes half tablet, diltiazem 120 mg extended release daily, Imdur 60 mg daily after breakfast Home readings #s: not checking BP Readings from Last 3 Encounters:  05/01/21 128/78  04/10/21 (!) 148/77  03/26/21 (!) 150/90  A/P: Blood pressure well controlled and patient orthostatic-I would prefer to let blood pressure trend higher-reducing Imdur to 30 mg and he will update me in 2 weeks as above.  Although he has CAD and prefer blood pressure less than 140 at present I believe falls related to orthostatic syncope or a greater risk  #Chronic kidney disease stage IV S: GFR is typically in the high teens or 20s range - edema was noted of lower extremities  while on Lasix by Dr. Ellyn Hack A/P: Patient with continued edema-we will continue Lasix daily.  Strongly encourage compression stockings though suspect element of venous stasis   # Migraine without Aura S:medication:Topamax 150 mg, tramadol 50 mg TID prn per [facility] Rx last visit-this helped bring his pain level down after a few days and now  able to use Tylenol only but unfortunately using 5 times a week  Patient was seen by Inda Coke on 03/26/2021 due to having increased headaches after 06/21/202- we decreased topamax 200 mg to 150 mg daily at that time for CKD in that visit. He also used tylenol prn with no relief. He stated medications have not been helpful - reported having pressure behind eyes and top of forehead. Denied burred vision or dizziness. He did have nausea that was relieved prior to the visit that morning. No new symptoms were reported. An ambulatory referral was placed to neurology.  MRI performed 02/08/21 without significant findings.  See impression above   A/P: Patient denies recent migraines but he has had ongoing lower level headaches taking Tylenol 5 times a week-potentially getting rebound headaches.  Have considered decreasing Topamax further to reduce medication burden but given significant worsening of migraines after prior reduction we opted to hold off for now-has November visit with neurology  -Reassuring neurological exam by [facility] recently and MRI brain during this timeframe  Recommended follow up: Return in about 2 months (around 07/01/2021). Future Appointments  Date Time Provider [place]  06/16/2021  9:10 AM Pieter Partridge, DO [facility] None  09/19/2021  4:20 PM [name], MD [facility]  10/08/2021  2:30 PM [facility] None  10/08/2021  3:00 PM Wyatt Portela, MD [facility] None  10/20/2021  1:30 PM [facility] CCM PHARMACIST [facility]  11/27/2021  8:00 AM [facility] HEALTH COACH [facility]    Lab/Order  associations:   ICD-10-CM   1. Essential hypertension  I10     2. B12 deficiency  E53.8     3. Hyperlipidemia with target LDL less than 70  E78.5     4. Hematuria, unspecified type  R31.9     5. Migraine without aura and without status migrainosus, not intractable  G43.009     6. Paroxysmal atrial fibrillation ([place]); CHA2DSVasc - 4; Now on Eliquis  I48.0     7. Chronic kidney disease (CKD), active medical management without dialysis, stage 4 (severe) ([place])  N18.4       No orders of the defined types were placed in this encounter.   Time Spent: 43 minutes of total time (4:35 PM-5:10 PM, 7:50 PM-7:58 PM) was spent on the date of the encounter performing the following actions: chart review prior to seeing the patient, obtaining history, performing a medically necessary exam, counseling on the treatment plan, placing orders, and documenting in our EHR.   I,Harris Phan,acting as a Education administrator for [name], MD.,have documented all relevant documentation on the behalf of [name], MD,as directed by  [name], MD while in the presence of [name], MD.   I, [name], MD, have reviewed all documentation for this visit. The documentation on 05/01/21 for the exam, diagnosis, procedures, and orders are all accurate and complete.   Return precautions advised.  [name], MD

## 2021-05-01 NOTE — Patient Instructions (Addendum)
Health Maintenance Due  Topic Date Due   COVID-19 Vaccine (3 - Booster for Moderna series)  Recommend getting Omicron variant specific booster only at your local pharmacy! Please let us know the date of when you have received this.  09/11/2020   INFLUENZA VACCINE   Patient will plan to receive this at his job.let us know when you get this  03/10/2021   I strongly advise that you start wearing your compression stockings to help with blood circulation.  Trial cutting Imdur in half to  30 mg (half of 60mg  is 30mg ). Please check you blood pressure readings and log the numbers on a notepad/piece of paper for the next two weeks. Please update with me through MyChart or you can give [place] a call - if you are experiencing any new or worsening symptoms, please let me know.   I strongly suggest that you move at a slower pace with position changes- good job working on this lately  Please keep your June 16, 2021 visit with Dr. Tomi Likens at Neurology - I would like to get his expert opinion before I make any other changes to your medications- such as topamax  Recommended follow up: Return in about 2 months (around 07/01/2021).

## 2021-05-09 DIAGNOSIS — E785 Hyperlipidemia, unspecified: Secondary | ICD-10-CM

## 2021-05-09 DIAGNOSIS — I480 Paroxysmal atrial fibrillation: Secondary | ICD-10-CM

## 2021-05-09 DIAGNOSIS — I10 Essential (primary) hypertension: Secondary | ICD-10-CM | POA: Diagnosis not present

## 2021-05-29 DIAGNOSIS — H353131 Nonexudative age-related macular degeneration, bilateral, early dry stage: Secondary | ICD-10-CM | POA: Diagnosis not present

## 2021-05-29 DIAGNOSIS — H52203 Unspecified astigmatism, bilateral: Secondary | ICD-10-CM | POA: Diagnosis not present

## 2021-06-06 ENCOUNTER — Telehealth: Payer: Self-pay | Admitting: Family Medicine

## 2021-06-06 NOTE — Telephone Encounter (Signed)
Received medical records from [facility] with a date rage from November 2000-April 2019  Imaging: MRI right knee: November 2000 Mild tricompartmental arthritis changes with oblique lateral meniscus tear and a tiny popliteal cyst.   Spine procedures: Patient had bilateral L4-L5 and L5-S1 facet injection with dexamethasone performed by Dr. Nelva Bush on November 11, 2017  L5-S1 epidural steroid injection midline September 16, 2017 Dr. Nelva Bush  Left L5 selective nerve root block February 10, 2012  Left L5 and S1 epidural steroid injection August 04, 2010  No lumbar MRI included

## 2021-06-10 DIAGNOSIS — Z6828 Body mass index (BMI) 28.0-28.9, adult: Secondary | ICD-10-CM | POA: Diagnosis not present

## 2021-06-10 DIAGNOSIS — D631 Anemia in chronic kidney disease: Secondary | ICD-10-CM | POA: Diagnosis not present

## 2021-06-10 DIAGNOSIS — N189 Chronic kidney disease, unspecified: Secondary | ICD-10-CM | POA: Diagnosis not present

## 2021-06-10 DIAGNOSIS — N183 Chronic kidney disease, stage 3 unspecified: Secondary | ICD-10-CM | POA: Diagnosis not present

## 2021-06-10 DIAGNOSIS — R310 Gross hematuria: Secondary | ICD-10-CM | POA: Diagnosis not present
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
## 2021-06-16 ENCOUNTER — Ambulatory Visit (INDEPENDENT_AMBULATORY_CARE_PROVIDER_SITE_OTHER): Payer: Medicare Other | Admitting: Neurology

## 2021-06-16 ENCOUNTER — Other Ambulatory Visit: Payer: Self-pay

## 2021-06-16 ENCOUNTER — Encounter: Payer: Self-pay | Admitting: Neurology

## 2021-06-16 VITALS — BP 170/82 | HR 73 | Ht 68.0 in | Wt 190.2 lb

## 2021-06-16 DIAGNOSIS — G43009 Migraine without aura, not intractable, without status migrainosus: Secondary | ICD-10-CM | POA: Diagnosis not present

## 2021-06-16 DIAGNOSIS — G4440 Drug-induced headache, not elsewhere classified, not intractable: Secondary | ICD-10-CM

## 2021-06-16 DIAGNOSIS — I2510 Atherosclerotic heart disease of native coronary artery without angina pectoris: Secondary | ICD-10-CM

## 2021-06-16 DIAGNOSIS — I10 Essential (primary) hypertension: Secondary | ICD-10-CM | POA: Diagnosis not present

## 2021-06-16 DIAGNOSIS — Z9861 Coronary angioplasty status: Secondary | ICD-10-CM | POA: Diagnosis not present

## 2021-06-16 NOTE — Patient Instructions (Signed)
Continue topiramate 200mg  daily Recommend to limit use of pain relievers to no more than 2 days out of week to prevent risk of rebound or medication-overuse headache. Recommend to discontinue caffeine Follow up as needed (if headaches worsen)

## 2021-07-16 ENCOUNTER — Telehealth: Payer: Self-pay | Admitting: Pharmacist

## 2021-07-16 NOTE — Chronic Care Management (AMB) (Signed)
Chronic Care Management Pharmacy Assistant   Name: Mark Jackson.  MRN: [id number] DOB: October 15, 1929   Reason for Encounter: Hypertension Adherence Call    Recent office visits:  05/01/2021 OV (PCP) [name], MD; - BP was controlled today and I reduced his imdur to 30mg  from 60mg  due to orthostatic hypertension.  Recent consult visits:  06/16/2021 OV (Neurology) Pieter Partridge, DO; -Recommend to limit use of pain relievers to no more than 2 days out of week to prevent risk of rebound or medication-overuse headache. -Recommend to discontinue caffeine  Hospital visits:  None in previous 6 months  Medications: Outpatient Encounter Medications as of 07/16/2021  Medication Sig Note   acetaminophen (TYLENOL) 325 MG tablet Take 325-650 mg by mouth every 6 (six) hours as needed for moderate pain or headache.     amoxicillin (AMOXIL) 500 MG tablet Take 2,000 mg (4 capsules) 1 hour prior to all dental visits.    apixaban (ELIQUIS) 2.5 MG TABS tablet TAKE 1 TABLET BY MOUTH TWICE DAILY.    diltiazem (CARDIZEM CD) 120 MG 24 hr capsule Take 1 capsule (120 mg total) by mouth daily.    ferrous sulfate 325 (65 FE) MG tablet Take 325 mg by mouth daily with breakfast.    furosemide (LASIX) 40 MG tablet Take 0.5 tablets (20 mg total) by mouth daily. 12/11/2020: When wearing compression hose decrease to every other day usage per Dr Ellyn Hack 12/11/20   guaiFENesin (MUCINEX) 600 MG 12 hr tablet Take by mouth 2 (two) times daily.    isosorbide mononitrate (IMDUR) 60 MG 24 hr tablet TAKE 1 TABLET ONCE DAILY AFTER BREAKFAST.and May take an additional 60 mg in the evening for 2 days   if you use Nitroglycerin sublingual tablets 12/11/2020: Decrease to 30 mg for short period   per Dr Ellyn Hack 12/11/20   loperamide (IMODIUM A-D) 2 MG tablet Take 2 mg by mouth 4 (four) times daily as needed for diarrhea or loose stools.    Multiple Vitamin (MULTIVITAMIN PO) Take 1 tablet by mouth daily.     nitroGLYCERIN  (NITROSTAT) 0.4 MG SL tablet ONE TABLET UNDER TONGUE WHEN NEEDED FOR CHEST PAIN. MAY REPEAT IN 5 MINUTES. 03/19/2021: Patient has on hand if needed   rosuvastatin (CRESTOR) 20 MG tablet TAKE (1) TABLET DAILY AT BEDTIME. 12/11/2020: On hold  per Dr Ellyn Hack 12/11/20   topiramate (TOPAMAX) 100 MG tablet Take 1.5 tablets (150 mg total) by mouth daily. 06/16/2021: Take 2 tab at bedtime.   vitamin B-12 (CYANOCOBALAMIN) 1000 MCG tablet Take 1,000 mcg by mouth daily.    No facility-administered encounter medications on file as of 07/16/2021.   Reviewed chart prior to disease state call. Spoke with patient regarding BP  Recent Office Vitals: BP Readings from Last 3 Encounters:  06/16/21 (!) 170/82  05/01/21 128/78  04/10/21 (!) 148/77   Pulse Readings from Last 3 Encounters:  06/16/21 73  05/01/21 80  04/10/21 82    Wt Readings from Last 3 Encounters:  06/16/21 190 lb 3.2 oz (86.3 kg)  05/01/21 183 lb 6.4 oz (83.2 kg)  04/10/21 180 lb 4.8 oz (81.8 kg)     Kidney Function Lab Results  Component Value Date/Time   CREATININE 2.8 (A) 11/15/2020 12:00 AM   CREATININE 2.97 (H) 07/19/2020 02:01 PM   CREATININE 2.45 (H) 04/07/2020 06:39 PM   CREATININE 2.51 (H) 01/10/2020 03:51 PM   CREATININE 2.04 (H) 06/10/2016 11:14 AM   GFR 26.76 (L) 09/12/2019 11:38  AM   GFRNONAA 21 11/15/2020 12:00 AM   GFRNONAA 18 (L) 07/19/2020 02:01 PM   GFRAA 21 11/15/2020 12:00 AM   GFRAA 21 (L) 07/19/2020 02:01 PM    BMP Latest Ref Rng & Units 11/15/2020 07/19/2020 04/07/2020  Glucose 65 - 99 mg/dL - 115(H) 107(H)  BUN 4 - 21 49(A) 56(H) 52(H)  Creatinine 0.6 - 1.3 2.8(A) 2.97(H) 2.45(H)  BUN/Creat Ratio 6 - 22 (calc) - 19 -  Sodium 137 - 147 142 141 140  Potassium 3.4 - 5.3 4.7 4.9 4.9  Chloride 99 - 108 109(A) 111(H) 112(H)  CO2 20 - 32 mmol/L - 22 18(L)  Calcium 8.7 - 10.7 8.7 8.3(L) 8.5(L)    Current antihypertensive regimen:  Imdur 30 mg daily Furosemide 20 mg daily Diltiazem 120 mg daily  How often  are you checking your Blood Pressure? Patient states he is not currently checking his blood pressure at home. He denies feeling bad or having any abnormal symptoms.  Current home BP readings: none to report.  What recent interventions/DTPs have been made by any provider to improve Blood Pressure control since last CPP Visit: Imdur recently lowered to 30 mg from 60 mg.   Any recent hospitalizations or ED visits since last visit with CPP? No  What diet changes have been made to improve Blood Pressure Control?  Patient states he hasn't made any recent diet changes.  What exercise is being done to improve your Blood Pressure Control?  Patient states he doesn't exercise that much.  Adherence Review: Is the patient currently on ACE/ARB medication? No Does the patient have >5 day gap between last estimated fill dates? Yes   Care Gaps: Medicare Annual Wellness: Completed 11/21/2020 Hemoglobin A1C: 6.1% on 12/08/2019 Colonoscopy: Aged out, last completed 09/18/2015  Future Appointments  Date Time Provider [place]  09/19/2021  4:20 PM [name], MD [facility]  10/08/2021  2:30 PM [facility] None  10/08/2021  3:00 PM Wyatt Portela, MD [facility] None  10/20/2021  1:30 PM [facility] CCM PHARMACIST [facility]  11/27/2021  8:00 AM [facility] HEALTH COACH [facility]    Star Rating Drugs: Rosuvastatin 20 mg last filled 12/06/2020 90 DS  April D Calhoun, [place] Pharmacist Assistant [phone]

## 2021-07-22 DIAGNOSIS — Z23 Encounter for immunization: Secondary | ICD-10-CM | POA: Diagnosis not present

## 2021-08-07 ENCOUNTER — Other Ambulatory Visit: Payer: Self-pay | Admitting: Cardiology

## 2021-08-07 DIAGNOSIS — Z9861 Coronary angioplasty status: Secondary | ICD-10-CM

## 2021-08-07 DIAGNOSIS — I480 Paroxysmal atrial fibrillation: Secondary | ICD-10-CM

## 2021-08-07 DIAGNOSIS — Z7901 Long term (current) use of anticoagulants: Secondary | ICD-10-CM

## 2021-08-07 NOTE — Telephone Encounter (Signed)
Prescription refill request for Eliquis received. Indication: a fib Last office visit: 03/19/21 Scr: 2.49 Age: 87 Weight: 86kg

## 2021-09-19 ENCOUNTER — Other Ambulatory Visit: Payer: Self-pay

## 2021-09-19 ENCOUNTER — Ambulatory Visit (INDEPENDENT_AMBULATORY_CARE_PROVIDER_SITE_OTHER): Payer: Medicare Other | Admitting: Cardiology

## 2021-09-19 ENCOUNTER — Encounter: Payer: Self-pay | Admitting: Cardiology

## 2021-09-19 VITALS — BP 152/88 | HR 67 | Ht 68.0 in | Wt 187.0 lb

## 2021-09-19 DIAGNOSIS — R600 Localized edema: Secondary | ICD-10-CM | POA: Diagnosis not present

## 2021-09-19 DIAGNOSIS — N184 Chronic kidney disease, stage 4 (severe): Secondary | ICD-10-CM | POA: Diagnosis not present

## 2021-09-19 DIAGNOSIS — E785 Hyperlipidemia, unspecified: Secondary | ICD-10-CM | POA: Diagnosis not present

## 2021-09-19 DIAGNOSIS — R5382 Chronic fatigue, unspecified: Secondary | ICD-10-CM

## 2021-09-19 DIAGNOSIS — I25119 Atherosclerotic heart disease of native coronary artery with unspecified angina pectoris: Secondary | ICD-10-CM

## 2021-09-19 DIAGNOSIS — I480 Paroxysmal atrial fibrillation: Secondary | ICD-10-CM

## 2021-09-19 DIAGNOSIS — Z952 Presence of prosthetic heart valve: Secondary | ICD-10-CM

## 2021-09-19 DIAGNOSIS — I10 Essential (primary) hypertension: Secondary | ICD-10-CM | POA: Diagnosis not present

## 2021-09-19 DIAGNOSIS — I779 Disorder of arteries and arterioles, unspecified: Secondary | ICD-10-CM

## 2021-09-19 DIAGNOSIS — R55 Syncope and collapse: Secondary | ICD-10-CM

## 2021-09-19 DIAGNOSIS — I5032 Chronic diastolic (congestive) heart failure: Secondary | ICD-10-CM | POA: Diagnosis not present

## 2021-09-19 NOTE — Progress Notes (Signed)
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 Person  Provider:   [name], MD    Other Instructions    Look in to purchasing  sock  caddy   help putting on support socks and purchasing  male version pure wick ( urination catcher )      [name], M.D., M.S. Interventional Cardiologist   Pager # [phone] Phone # [phone] [address] [place], [place] [address]   Thank you for choosing Heartcare at [facility]!!

## 2021-09-19 NOTE — Patient Instructions (Addendum)
Medication Instructions:   If  you take a few days of Nitroglycerin then take  double dose of  imdur  for few days  then return to regular dose  *If you need a refill on your cardiac medications before your next appointment, please call your pharmacy*   Lab Work:  Not needed   Testing/Procedures: Not needed   Follow-Up: At [facility], you and your health needs are our priority.  As part of our continuing mission to provide you with exceptional heart care, we have created designated Provider Care Teams.  These Care Teams include your primary Cardiologist (physician) and Advanced Practice Providers (APPs -  Physician Assistants and Nurse Practitioners) who all work together to provide you with the care you need, when you need it.     Your next appointment:   6 month(s)  The format for your next appointment:   In Person  Provider:   [name], MD    Other Instructions    Look in to purchasing  sock  caddy   help putting on support socks and purchasing  male version pure wick ( urination catcher )

## 2021-09-20 ENCOUNTER — Other Ambulatory Visit: Payer: Self-pay | Admitting: Cardiology

## 2021-09-20 ENCOUNTER — Encounter: Payer: Self-pay | Admitting: Cardiology

## 2021-09-20 NOTE — Assessment & Plan Note (Signed)
Most likely etiology for this is that he is just not getting enough uninterrupted sleep.  Recommend that he look into getting the pure wick catheter to help avoid nighttime nocturia issues.  If he is able to sleep through the night without having to get up to go urinate, he can probably get more sleep.

## 2021-09-20 NOTE — Assessment & Plan Note (Addendum)
Doing well.  Follow-up echocardiogram showed stabilized gradients.  Due for annual follow-up echocardiogram in roughly July-September.  We can reorder in 2-month follow-up..   SBE prophylaxis discussed

## 2021-09-20 NOTE — Assessment & Plan Note (Signed)
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  NYHA class I-II symptoms mostly because of his edema, but he does also have some exercise intolerance/dyspnea on exertion.  No PND orthopnea.  Not on beta-blocker because of fatigue.  Using low-dose diltiazem for rate control if he does have A-fib. Not on afterload reduction to allow for permissive hypertension. On stable dose of Lasix but I did recommend that he take additional dose as needed for worsening edema or weight gain.

## 2021-09-20 NOTE — Assessment & Plan Note (Signed)
Blood pressure is elevated today, but with his issues with orthostatic hypotension related dizziness and syncope, we are allowing for permissive hypertension.  He remains on low-dose diltiazem along with Imdur.  Not wanting to be more aggressive.

## 2021-09-20 NOTE — Assessment & Plan Note (Signed)
Seems to be doing well.  Stable with no breakthrough spells for second tell on low-dose diltiazem.  Did not tolerate higher dose because of bradycardia issues.  He usually is able to tell when he is in A-fib because of fatigue.  No more hematuria.  Back on Eliquis.  Continue rate control with diltiazem and Eliquis for DOAC.   Okay to hold Eliquis 2 to 3 days preop for surgical procedure.

## 2021-09-20 NOTE — Assessment & Plan Note (Signed)
Hopefully will have labs checked by PCP in the near future.  We will need to reassess his lipids.  Have been well controlled as of last CHEK2 years ago. ° °Has remained on simvastatin despite having about a 2-week holiday with no benefit.  Otherwise I would expect his lipids to be relatively stable. °

## 2021-09-20 NOTE — Assessment & Plan Note (Addendum)
Likely combination of chronic venous stasis and diastolic dysfunction plus renal failure.  Having a hard time with putting on support stockings.  We talked about sock caddy to help with putting on socks but also the zipper option for compression stocks.  Discussed foot elevation and foot exercises.  He does not sleep with his legs elevated just on one pillow.  Would recommend Lounge Dr pillow.

## 2021-09-20 NOTE — Assessment & Plan Note (Signed)
Thankfully, no further episodes.  A lot of recent hypertension, recommending support stockings.

## 2021-09-20 NOTE — Assessment & Plan Note (Signed)
Last seen by Dr. Trula Slade in 2021.  Also following abdominal aortic ulcer.  Continue aggressive risk factor modification with lipid control.  Despite allowing mild permissive hypertension, do not want to allow for too much.

## 2021-09-20 NOTE — Assessment & Plan Note (Signed)
Non-STEMI back in 2016 with LAD Lesion Treated PCI.  Widely Patent on Last cardiac cath in 2020.  Not on aspirin Plavix because of Eliquis. Not on beta-blocker because of fatigue.  On diltiazem. On Imdur for added benefit. He is taking nitroglycerin off and on for chest pain.  Chest pain is at rest, would argue that it may be noncardiac versus spasm related pain. => He is taking Imdur as well, recommend that he take double dose for couple days after using silver nitrate.

## 2021-09-20 NOTE — Assessment & Plan Note (Signed)
Most recent creatinine was 2.8 back in April.  Followed by Dr. Moshe Cipro from nephrology.  We will hope to try to avoid further invasive evaluation requiring contrast such as cardiac catheterization or coronary CTA. Still making urine. Is on STEMI dose of Lasix for edema.  Will defer to nephrology, but may require higher dose to treat lower extremity edema.

## 2021-10-08 ENCOUNTER — Inpatient Hospital Stay: Payer: Medicare Other | Attending: Oncology | Admitting: Oncology

## 2021-10-08 ENCOUNTER — Inpatient Hospital Stay: Payer: Medicare Other

## 2021-10-08 ENCOUNTER — Other Ambulatory Visit: Payer: Self-pay

## 2021-10-08 VITALS — BP 143/67 | HR 70 | Temp 97.8°F | Resp 19 | Ht 68.0 in | Wt 184.7 lb

## 2021-10-08 DIAGNOSIS — D518 Other vitamin B12 deficiency anemias: Secondary | ICD-10-CM

## 2021-10-08 DIAGNOSIS — D649 Anemia, unspecified: Secondary | ICD-10-CM

## 2021-10-08 DIAGNOSIS — D72821 Monocytosis (symptomatic): Secondary | ICD-10-CM | POA: Insufficient documentation

## 2021-10-08 DIAGNOSIS — D696 Thrombocytopenia, unspecified: Secondary | ICD-10-CM | POA: Insufficient documentation

## 2021-10-08 DIAGNOSIS — D539 Nutritional anemia, unspecified: Secondary | ICD-10-CM | POA: Insufficient documentation

## 2021-10-08 LAB — CBC WITH DIFFERENTIAL (CANCER CENTER ONLY)
Abs Immature Granulocytes: 0.03 10*3/uL (ref 0.00–0.07)
Basophils Absolute: 0 10*3/uL (ref 0.0–0.1)
Basophils Relative: 0 %
Eosinophils Absolute: 0.5 10*3/uL (ref 0.0–0.5)
Eosinophils Relative: 4 %
HCT: 33.5 % — ABNORMAL LOW (ref 39.0–52.0)
Hemoglobin: 11.1 g/dL — ABNORMAL LOW (ref 13.0–17.0)
Immature Granulocytes: 0 %
Lymphocytes Relative: 15 %
Lymphs Abs: 1.7 10*3/uL (ref 0.7–4.0)
MCH: 33.2 pg (ref 26.0–34.0)
MCHC: 33.1 g/dL (ref 30.0–36.0)
MCV: 100.3 fL — ABNORMAL HIGH (ref 80.0–100.0)
Monocytes Absolute: 1.1 10*3/uL — ABNORMAL HIGH (ref 0.1–1.0)
Monocytes Relative: 10 %
Neutro Abs: 7.8 10*3/uL — ABNORMAL HIGH (ref 1.7–7.7)
Neutrophils Relative %: 71 %
Platelet Count: 118 10*3/uL — ABNORMAL LOW (ref 150–400)
RBC: 3.34 MIL/uL — ABNORMAL LOW (ref 4.22–5.81)
RDW: 13 % (ref 11.5–15.5)
WBC Count: 11.2 10*3/uL — ABNORMAL HIGH (ref 4.0–10.5)
nRBC: 0 % (ref 0.0–0.2)

## 2021-10-08 NOTE — Progress Notes (Signed)
Hematology and Oncology Follow Up Visit ? ?Mark Jackson. ?[id number] ?[birth year] 88 y.o. ?10/08/2021 2:35 PM ?Mark Jackson, MDHunter, [name], MD  ? ?Principle Diagnosis: 88 year old man with mild macrocytic anemia diagnosed in April 2022.  Differential diagnosis including myelodysplasia. ? ? ? ? ?Current therapy: Active surveillance. ? ?Interim History: Mark Jackson returns today for repeat evaluation.  Since last visit, he reports no major changes in his health.  He denies any recent hospitalizations or illnesses.  He denies any excessive fatigue, weakness or tiredness.  Continues to be ambulatory without any decline in ability to do so.  He does use a cane without any issues.  His performance status quality of life remains unchanged. ? ? ? ? ?Medications: Updated on review. ?Current Outpatient Medications  ?Medication Sig Dispense Refill  ? acetaminophen (TYLENOL) 325 MG tablet Take 325-650 mg by mouth every 6 (six) hours as needed for moderate pain or headache.     ? amoxicillin (AMOXIL) 500 MG tablet Take 2,000 mg (4 capsules) 1 hour prior to all dental visits. 8 tablet 11  ? diltiazem (CARDIZEM CD) 120 MG 24 hr capsule Take 1 capsule (120 mg total) by mouth daily. 90 capsule 3  ? ELIQUIS 2.5 MG TABS tablet TAKE ONE TABLET BY MOUTH TWICE DAILY 180 tablet 1  ? ferrous sulfate 325 (65 FE) MG tablet Take 325 mg by mouth daily with breakfast.    ? furosemide (LASIX) 40 MG tablet Take 0.5 tablets (20 mg total) by mouth daily.    ? guaiFENesin (MUCINEX) 600 MG 12 hr tablet Take by mouth 2 (two) times daily.    ? isosorbide mononitrate (IMDUR) 60 MG 24 hr tablet TAKE 1 TABLET ONCE DAILY AFTER BREAKFAST.and May take an additional 60 mg in the evening for 2 days   if you use Nitroglycerin sublingual tablets 120 tablet 3  ? loperamide (IMODIUM A-D) 2 MG tablet Take 2 mg by mouth 4 (four) times daily as needed for diarrhea or loose stools.    ? Multiple Vitamin (MULTIVITAMIN PO) Take 1 tablet by mouth daily.     ?  nitroGLYCERIN (NITROSTAT) 0.4 MG SL tablet ONE TABLET UNDER TONGUE WHEN NEEDED FOR CHEST PAIN. MAY REPEAT IN 5 MINUTES. 25 tablet 0  ? rosuvastatin (CRESTOR) 20 MG tablet TAKE (1) TABLET DAILY AT BEDTIME. 90 tablet 1  ? topiramate (TOPAMAX) 100 MG tablet Take 1.5 tablets (150 mg total) by mouth daily. 135 tablet 3  ? vitamin B-12 (CYANOCOBALAMIN) 1000 MCG tablet Take 1,000 mcg by mouth daily.    ? ?No current facility-administered medications for this visit.  ? ? ? ?Allergies: No Known Allergies ? ? ? ?Physical Exam: ?Blood pressure (!) 143/67, pulse 70, temperature 97.8 ?F (36.6 ?C), temperature source Temporal, resp. rate 19, height 5\' 8"  (1.727 m), weight 184 lb 11.2 oz (83.8 kg), SpO2 100 %. ? ? ?ECOG: 1 ? ? ?General appearance: Alert, awake without any distress. ?Head: Atraumatic without abnormalities ?Oropharynx: Without any thrush or ulcers. ?Eyes: No scleral icterus. ?Lymph nodes: No lymphadenopathy noted in the cervical, supraclavicular, or axillary nodes ?Heart:regular rate and rhythm, without any murmurs or gallops.   ?Lung: Clear to auscultation without any rhonchi, wheezes or dullness to percussion. ?Abdomin: Soft, nontender without any shifting dullness or ascites. ?Musculoskeletal: No clubbing or cyanosis. ?Neurological: No motor or sensory deficits. ?Skin: No rashes or lesions. ? ? ? ?Lab Results: ?Lab Results  ?Component Value Date  ? WBC 11.0 (H) 04/10/2021  ? HGB 12.2 (  L) 04/10/2021  ? HCT 37.1 (L) 04/10/2021  ? MCV 100.0 04/10/2021  ? PLT 109 (L) 04/10/2021  ? ?  Chemistry   ?   ?Component Value Date/Time  ? NA 142 11/15/2020 0000  ? K 4.7 11/15/2020 0000  ? CL 109 (A) 11/15/2020 0000  ? CO2 22 07/19/2020 1401  ? BUN 49 (A) 11/15/2020 0000  ? CREATININE 2.8 (A) 11/15/2020 0000  ? CREATININE 2.97 (H) 07/19/2020 1401  ? GLU 96 11/15/2020 0000  ?    ?Component Value Date/Time  ? CALCIUM 8.7 11/15/2020 0000  ? ALKPHOS 54 12/08/2019 0949  ? AST 18 07/19/2020 1401  ? ALT 9 07/19/2020 1401  ? BILITOT  0.4 07/19/2020 1401  ?  ? ? ? ? ? ?Impression and Plan: ? ? ?88 year old with: ? ?1.  Mild macrocytic anemia without any clear etiology noted in April 2022.  ? ?The differential diagnosis were reviewed at this time myelodysplastic syndrome versus reactive findings including medication could be a consideration.  CBC from today personally reviewed and showed a hemoglobin of 11 and MCV of 100.3.  He has also mild monocytosis which could indicate possible myelodysplastic syndrome. ? ?At this time, I recommended continued monitoring given the mild hematological findings at this time and limited therapeutic options moving forward. ? ?2.  Thrombocytopenia: His platelet count remains adequate without any active bleeding.  This could be an early indication of myelodysplasia. ? ? ?3.  Follow-up: He will return in 6 months for repeat evaluation. ? ? ?30  minutes were spent on this encounter.  The time was dedicated to reviewing laboratory data, differential diagnosis discussion, management options and future plan of care review. ? ? ?Zola Button, MD ?3/1/20232:35 PM ? ?

## 2021-10-15 NOTE — Progress Notes (Signed)
Chronic Care Management Pharmacy Note  10/20/2021 Name:  Mark Jackson. MRN:  [id number] DOB:  1930/05/22  Summary: PharmD follow up.  No changes needed at this time.  Recommendations/Changes made from today's visit: Due for updated routine labs - recommended he schedule FU visit with PCP  Plan: FU 6 months   Subjective: Mark Jackson. is an 88 y.o. year old male who is a primary patient of Hunter, Aldine Contes, MD.  The CCM team was consulted for assistance with disease management and care coordination needs.    Engaged with patient by telephone for follow up visit in response to provider referral for pharmacy case management and/or care coordination services.   Consent to Services:  The patient was given the following information about Chronic Care Management services today, agreed to services, and gave verbal consent: 1. CCM service includes personalized support from designated clinical staff supervised by the primary care provider, including individualized plan of care and coordination with other care providers 2. 24/7 contact phone numbers for assistance for urgent and routine care needs. 3. Service will only be billed when office clinical staff spend 20 minutes or more in a month to coordinate care. 4. Only one practitioner may furnish and bill the service in a calendar month. 5.The patient may stop CCM services at any time (effective at the end of the month) by phone call to the office staff. 6. The patient will be responsible for cost sharing (co-pay) of up to 20% of the service fee (after annual deductible is met). Patient agreed to services and consent obtained.  Patient Care Team: Shelva Majestic, MD as PCP - General (Family Medicine) Marykay Lex, MD as PCP - Cardiology (Cardiology) Runell Gess, MD as Consulting Physician (Cardiology) Annie Sable, MD as Consulting Physician (Nephrology) Hart Carwin, MD (Inactive) as Consulting Physician  (Gastroenterology) Love, Genene Churn, MD as Consulting Physician (Neurology) Erroll Luna, [facility] (Pharmacist)  Recent office visits:  03/26/2021 Leanor Rubenstein, Lelon Mast, PA; Keflex 500 mg BID for possible cystitis and Tramadol 50 mg TID prn for new daily persistent headache    Recent consult visits:  03/19/2021 OV (cardiology) Marykay Lex, MD; two week statin holiday due to fatigue and muscle aches. If symptoms do improve, he can restart rosuvastatin half tablet dose (10 mg).   Hospital visits:  None in previous 6 months   Objective:  Lab Results  Component Value Date   CREATININE 2.8 (A) 11/15/2020   BUN 49 (A) 11/15/2020   GFR 26.76 (L) 09/12/2019   GFRNONAA 21 11/15/2020   GFRAA 21 11/15/2020   NA 142 11/15/2020   K 4.7 11/15/2020   CALCIUM 8.7 11/15/2020   CO2 22 07/19/2020   GLUCOSE 115 (H) 07/19/2020    Lab Results  Component Value Date/Time   HGBA1C 5.6 07/19/2020 02:01 PM   HGBA1C 6.1 (H) 12/08/2019 09:49 AM   GFR 26.76 (L) 09/12/2019 11:38 AM   GFR 25.51 (L) 03/16/2019 12:05 PM    Last diabetic Eye exam: No results found for: HMDIABEYEEXA  Last diabetic Foot exam: No results found for: HMDIABFOOTEX   Lab Results  Component Value Date   CHOL 91 11/29/2019   HDL 35 (L) 11/29/2019   LDLCALC 46 11/29/2019   LDLDIRECT 52.0 09/12/2018   TRIG 49 11/29/2019   CHOLHDL 2.6 11/29/2019    Hepatic Function Latest Ref Rng & Units 11/15/2020 07/19/2020 12/08/2019  Total Protein 6.1 - 8.1 g/dL - 6.0(L) 6.6  Albumin  3.5 - 5.0 3.6 - 3.5  AST 10 - 35 U/L - 18 17  ALT 9 - 46 U/L - 9 11  Alk Phosphatase 38 - 126 U/L - - 54  Total Bilirubin 0.2 - 1.2 mg/dL - 0.4 0.4  Bilirubin, Direct 0.0 - 0.3 mg/dL - - -    Lab Results  Component Value Date/Time   TSH 3.970 12/11/2020 12:33 PM   TSH 2.98 07/19/2020 02:01 PM    CBC Latest Ref Rng & Units 10/08/2021 04/10/2021 03/20/2021  WBC 4.0 - 10.5 K/uL 11.2(H) 11.0(H) 11.9(H)  Hemoglobin 13.0 - 17.0 g/dL 11.1(L) 12.2(L) 12.4(L)   Hematocrit 39.0 - 52.0 % 33.5(L) 37.1(L) 37.8(L)  Platelets 150 - 400 K/uL 118(L) 109(L) 78(L)    No results found for: VD25OH  Clinical ASCVD: Yes  The ASCVD Risk score (Arnett DK, et al., 2019) failed to calculate for the following reasons:   The 2019 ASCVD risk score is only valid for ages 25 to 56    Depression screen PHQ 2/9 01/28/2021 11/21/2020 03/22/2020  Decreased Interest 0 0 0  Down, Depressed, Hopeless 0 1 0  PHQ - 2 Score 0 1 0  Some recent data might be hidden     Social History   Tobacco Use  Smoking Status Former   Types: Pipe, Cigars   Quit date: 08/10/1968   Years since quitting: 53.2  Smokeless Tobacco Never   BP Readings from Last 3 Encounters:  10/08/21 (!) 143/67  09/19/21 (!) 152/88  06/16/21 (!) 170/82   Pulse Readings from Last 3 Encounters:  10/08/21 70  09/19/21 67  06/16/21 73   Wt Readings from Last 3 Encounters:  10/08/21 184 lb 11.2 oz (83.8 kg)  09/19/21 187 lb (84.8 kg)  06/16/21 190 lb 3.2 oz (86.3 kg)   BMI Readings from Last 3 Encounters:  10/08/21 28.08 kg/m  09/19/21 28.43 kg/m  06/16/21 28.92 kg/m    Assessment/Interventions: Review of patient past medical history, allergies, medications, health status, including review of consultants reports, laboratory and other test data, was performed as part of comprehensive evaluation and provision of chronic care management services.   SDOH:  (Social Determinants of Health) assessments and interventions performed: Yes  Financial Resource Strain: Low Risk    Difficulty of Paying Living Expenses: Not hard at all    SDOH Screenings   Alcohol Screen: Not on file  Depression (PHQ2-9): Low Risk    PHQ-2 Score: 0  Financial Resource Strain: Low Risk    Difficulty of Paying Living Expenses: Not hard at all  Food Insecurity: No Food Insecurity   Worried About Charity fundraiser in the Last Year: Never true   Ran Out of Food in the Last Year: Never true  Housing: Low Risk    Last  Housing Risk Score: 0  Physical Activity: Inactive   Days of Exercise per Week: 0 days   Minutes of Exercise per Session: 0 min  Social Connections: Moderately Integrated   Frequency of Communication with Friends and Family: More than three times a week   Frequency of Social Gatherings with Friends and Family: More than three times a week   Attends Religious Services: More than 4 times per year   Active Member of [employer] or Organizations: Yes   Attends Archivist Meetings: 1 to 4 times per year   Marital Status: Widowed  Stress: No Stress Concern Present   Feeling of Stress : Only a little  Tobacco Use: Medium Risk  Smoking Tobacco Use: Former   Smokeless Tobacco Use: Never   Passive Exposure: Not on file  Transportation Needs: No Transportation Needs   Lack of Transportation (Medical): No   Lack of Transportation (Non-Medical): No    CCM Care Plan  No Known Allergies  Medications Reviewed Today     Reviewed by Edythe Clarity, [facility] (Pharmacist) on 10/20/21 at 1346  Med List Status: <None>   Medication Order Taking? Sig Documenting Provider Last Dose Status Informant  acetaminophen (TYLENOL) 325 MG tablet [id number] Yes Take 325-650 mg by mouth every 6 (six) hours as needed for moderate pain or headache.  [provider] Taking Active Self  amoxicillin (AMOXIL) 500 MG tablet [id number] Yes Take 2,000 mg (4 capsules) 1 hour prior to all dental visits. Eileen Stanford, PA-C Taking Active   diltiazem (CARDIZEM CD) 120 MG 24 hr capsule [id number] Yes Take 1 capsule (120 mg total) by mouth daily. [name], MD Taking Active   ELIQUIS 2.5 MG TABS tablet [id number] Yes TAKE ONE TABLET BY MOUTH TWICE DAILY [name], MD Taking Active   ferrous sulfate 325 (65 FE) MG tablet [id number] Yes Take 325 mg by mouth daily with breakfast. [provider] Taking Active Self  furosemide (LASIX) 40 MG tablet [id number] Yes Take 0.5 tablets (20 mg total) by  mouth daily. Barton Dubois, MD Taking Active Self           Med Note Angelena Form   Fri Sep 19, 2021  4:42 PM)    guaiFENesin (MUCINEX) 600 MG 12 hr tablet [id number] Yes Take by mouth 2 (two) times daily. [provider] Taking Active   isosorbide mononitrate (IMDUR) 60 MG 24 hr tablet [id number] Yes TAKE 1 TABLET ONCE DAILY AFTER BREAKFAST.and May take an additional 60 mg in the evening for 2 days   if you use Nitroglycerin sublingual tablets [name], MD Taking Active            Med Note Jacki Cones, Tiney Rouge   Fri Sep 19, 2021  4:42 PM)    loperamide (IMODIUM A-D) 2 MG tablet [id number] Yes Take 2 mg by mouth 4 (four) times daily as needed for diarrhea or loose stools. [provider] Taking Active   Multiple Vitamin (MULTIVITAMIN PO) [id number] Yes Take 1 tablet by mouth daily.  [provider] Taking Active Self  nitroGLYCERIN (NITROSTAT) 0.4 MG SL tablet [id number] Yes ONE TABLET UNDER TONGUE WHEN NEEDED FOR CHEST PAIN. MAY REPEAT IN 5 MINUTES. [name], MD Taking Active   rosuvastatin (CRESTOR) 20 MG tablet [id number] Yes TAKE (1) TABLET DAILY AT BEDTIME. [name], MD Taking Active            Med Note Jacki Cones, [place] [facility]   Fri Sep 19, 2021  4:42 PM)    topiramate (TOPAMAX) 100 MG tablet [id number] Yes Take 1.5 tablets (150 mg total) by mouth daily. Inda Coke, [place] Taking Active            Med Note Angelena Form   Fri Sep 19, 2021  4:42 PM)    vitamin B-12 (CYANOCOBALAMIN) 1000 MCG tablet [id number] Yes Take 1,000 mcg by mouth daily. [provider] Taking Active             Patient Active Problem List   Diagnosis Date Noted   Hematuria 12/11/2020   Insomnia, psychophysiological 02/28/2020   Severe aortic stenosis 12/12/2019   Chronic diastolic heart failure ([place]) 12/12/2019  S/P TAVR (transcatheter aortic valve replacement) 12/12/2019   Syncope and collapse 11/27/2019   Fatigue 11/08/2017   Hyperglycemia  09/27/2017   Bilateral lower extremity edema 04/15/2017   B12 deficiency 01/06/2017   Chronic diarrhea 01/06/2017   BPH associated with nocturia 06/15/2016   Perianal dermatitis 06/19/2015   Rectal bleeding 04/25/2015   CAD S/P DES PCI to proximal LAD 03/22/2015   Long term current use of anticoagulant therapy 08/27/2014   Paroxysmal atrial fibrillation ([place]); CHA2DSVasc - 4; Now on Eliquis 08/20/2014    Class: Diagnosis of   Chronic kidney disease (CKD), active medical management without dialysis, stage 4 (severe) ([place]) 08/20/2014   Hereditary and idiopathic peripheral neuropathy 01/12/2014   Obesity (BMI 30-39.9) 09/03/2013   H/O syncope 09/03/2013   Right-sided carotid artery disease; followed by Dr. Trula Slade 03/02/2013   Hyperlipidemia with target LDL less than 70 03/02/2013   Migraine without aura 10/26/2012   Anemia 10/23/2008   GLAUCOMA 10/23/2008   Essential hypertension 10/22/2008   Arthropathy 10/22/2008   Excessive daytime sleepiness 10/22/2008   Personal history of prostate cancer 10/22/2008   History of colonic polyps 10/22/2008    Immunization History  Administered Date(s) Administered   Fluad Quad(high Dose 65+) 05/11/2019, 05/16/2020   Influenza Inj Mdck Quad Pf 05/26/2016   Influenza Split 04/21/2012, 06/24/2014   Influenza,inj,Quad PF,6+ Mos 05/26/2016   Influenza-Unspecified 05/26/2016, 06/10/2017, 05/10/2018   Moderna SARS-COV2 Booster Vaccination 10/24/2020   Moderna Sars-Covid-2 Vaccination 03/14/2020, 04/11/2020   Pneumococcal Conjugate-13 05/15/2014   Pneumococcal Polysaccharide-23 09/28/2016   Pneumococcal-Unspecified 09/28/2016   Tdap 03/02/2019   Zoster Recombinat (Shingrix) 02/11/2018, 05/17/2018   Zoster, Live 08/10/2012    Conditions to be addressed/monitored:  HTN, Afib, HLD  Care Plan : General Pharmacy (Adult)  Updates made by Edythe Clarity, RPH since 10/20/2021 12:00 AM     Problem: HTN, HLD, Afib   Priority: High  Onset Date:  04/15/2021     Long-Range Goal: Patient-Specific Goal   Start Date: 04/15/2021  Expected End Date: 10/14/2021  Recent Progress: On track  Priority: High  Note:   Current Barriers:  Unable to achieve control of BP   Pharmacist Clinical Goal(s):  Patient will achieve adherence to monitoring guidelines and medication adherence to achieve therapeutic efficacy achieve control of BP as evidenced by monitoring contact provider office for questions/concerns as evidenced notation of same in electronic health record through collaboration with PharmD and provider.   Interventions: 1:1 collaboration with [name], MD regarding development and update of comprehensive plan of care as evidenced by provider attestation and co-signature Inter-disciplinary care team collaboration (see longitudinal plan of care) Comprehensive medication review performed; medication list updated in electronic medical record  Hypertension (BP goal <140/90) -Uncontrolled based on office BP -Current treatment: Isosorbide 60mg  24hr daily Diltiazem CD 120mg  daily -Medications previously tried: Metoprolol XL  -Current home readings: not checking at home  -Reports hypotensive/hypertensive symptoms - daily headaches (migraines) -Educated on BP goals and benefits of medications for prevention of heart attack, stroke and kidney damage; Importance of home blood pressure monitoring; Symptoms of hypotension and importance of maintaining adequate hydration; -Counseled to monitor BP at home as able, document, and provide log at future appointments -Recommended to continue current medication  Hyperlipidemia: (LDL goal < 70) -Controlled -Current treatment: Rosuvastatin 20mg  daily Appropriate, Query effective,  -Medications previously tried: none noted  -Most recent LDL is excellent, due for re check -Educated on Cholesterol goals;  Benefits of statin for ASCVD risk reduction; Importance of  limiting foods high in  cholesterol; -Recommended to continue current medication  Update 10/20/21 Last lipid panel was in 2021.  LDL was well controlled at this time.  Reports adherence to statin with no concerns. Recommended FU appt with PCP for labs and routine physical. Patient agreeable  Atrial Fibrillation (Goal: prevent stroke and major bleeding) -Controlled -CHADSVASC: 4 -Current treatment: Rate control: Diltiazem CD 120mg  24hr daily Appropriate, Effective, Safe, Accessible Anticoagulation: Eliquis 2.5mg  BID Appropriate, Effective, Safe, Accessible -Medications previously tried: none noted -Home BP and HR readings: not checking at home  -Counseled on increased risk of stroke due to Afib and benefits of anticoagulation for stroke prevention; bleeding risk associated with Eliquis and importance of self-monitoring for signs/symptoms of bleeding; -He is on appropriate dose of Eliquis due to age and Scr. -Recommend f/u on labs at next visit with Dr. [name]. -Recommended to continue current medication  Update 10/20/21 Eliquis dose still appropriate based on renal function/age. Eliquis copay is affordable, no concerns at this time.  He is not monitoring BP at home.  Does mention occasional dizziness and headaches.  Occur randomly throughout the day no specific timing. Encouraged occasional BP checks if able. No further changes at this time.    Patient Goals/Self-Care Activities Patient will:  - take medications as prescribed focus on medication adherence by pill box check blood pressure as able, document, and provide at future appointments  Follow Up Plan: The care management team will reach out to the patient again over the next 180 days.             Medication Assistance: None required.  Patient affirms current coverage meets needs.  Compliance/Adherence/Medication fill history: Care Gaps: None identified  Star-Rating Drugs: Rosuvastatin 20mg  12/06/20 90ds?  Patient's preferred pharmacy  is:  [facility], [address] [place] [address] Phone: [phone] Fax: [facility] [address] [place] [address] Phone: [phone] Fax: [phone]    We discussed: Benefits of medication synchronization, packaging and delivery as well as enhanced pharmacist oversight with [employer]. Patient decided to: Continue current medication management strategy  Care Plan and Follow Up Patient Decision:  Patient agrees to Care Plan and Follow-up.  Plan: The care management team will reach out to the patient again over the next 180 days.  Beverly Milch, PharmD Clinical Pharmacist [phone]

## 2021-10-20 ENCOUNTER — Ambulatory Visit (INDEPENDENT_AMBULATORY_CARE_PROVIDER_SITE_OTHER): Payer: Medicare Other | Admitting: Pharmacist

## 2021-10-20 DIAGNOSIS — E785 Hyperlipidemia, unspecified: Secondary | ICD-10-CM

## 2021-10-20 DIAGNOSIS — I480 Paroxysmal atrial fibrillation: Secondary | ICD-10-CM

## 2021-10-20 NOTE — Patient Instructions (Addendum)
Visit Information ? ? Goals Addressed   ? ?  ?  ?  ?  ? This Visit's Progress  ?  Track and Manage My Blood Pressure-Hypertension   On track  ?  Timeframe:  Long-Range Goal ?Priority:  High ?Start Date:   04/15/21                          ?Expected End Date:  10/13/20                    ? ?Follow Up Date 07/15/21  ?  ?- check blood pressure weekly ?- choose a place to take my blood pressure (home, clinic or office, retail store)  ?  ?Why is this important?   ?You won't feel high blood pressure, but it can still hurt your blood vessels.  ?High blood pressure can cause heart or kidney problems. It can also cause a stroke.  ?Making lifestyle changes like losing a little weight or eating less salt will help.  ?Checking your blood pressure at home and at different times of the day can help to control blood pressure.  ?If the doctor prescribes medicine remember to take it the way the doctor ordered.  ?Call the office if you cannot afford the medicine or if there are questions about it.   ?  ?Notes:  ?  ? ?  ? ?Patient Care Plan: General Pharmacy (Adult)  ?  ? ?Problem Identified: HTN, HLD, Afib   ?Priority: High  ?Onset Date: 04/15/2021  ?  ? ?Long-Range Goal: Patient-Specific Goal   ?Start Date: 04/15/2021  ?Expected End Date: 10/14/2021  ?Recent Progress: On track  ?Priority: High  ?Note:   ?Current Barriers:  ?Unable to achieve control of BP  ? ?Pharmacist Clinical Goal(s):  ?Patient will achieve adherence to monitoring guidelines and medication adherence to achieve therapeutic efficacy ?achieve control of BP as evidenced by monitoring ?contact provider office for questions/concerns as evidenced notation of same in electronic health record through collaboration with PharmD and provider.  ? ?Interventions: ?1:1 collaboration with [name], MD regarding development and update of comprehensive plan of care as evidenced by provider attestation and co-signature ?Inter-disciplinary care team collaboration (see  longitudinal plan of care) ?Comprehensive medication review performed; medication list updated in electronic medical record ? ?Hypertension (BP goal <140/90) ?-Uncontrolled based on office BP ?-Current treatment: ?Isosorbide '60mg'$  24hr daily ?Diltiazem CD '120mg'$  daily ?-Medications previously tried: Metoprolol XL  ?-Current home readings: not checking at home ? ?-Reports hypotensive/hypertensive symptoms - daily headaches (migraines) ?-Educated on BP goals and benefits of medications for prevention of heart attack, stroke and kidney damage; ?Importance of home blood pressure monitoring; ?Symptoms of hypotension and importance of maintaining adequate hydration; ?-Counseled to monitor BP at home as able, document, and provide log at future appointments ?-Recommended to continue current medication ? ?Hyperlipidemia: (LDL goal < 70) ?-Controlled ?-Current treatment: ?Rosuvastatin '20mg'$  daily Appropriate, Query effective,  ?-Medications previously tried: none noted  ?-Most recent LDL is excellent, due for re check ?-Educated on Cholesterol goals;  ?Benefits of statin for ASCVD risk reduction; ?Importance of limiting foods high in cholesterol; ?-Recommended to continue current medication ? ?Update 10/20/21 ?Last lipid panel was in 2021.  LDL was well controlled at this time.  Reports adherence to statin with no concerns. ?Recommended FU appt with PCP for labs and routine physical. ?Patient agreeable ? ?Atrial Fibrillation (Goal: prevent stroke and major bleeding) ?-Controlled ?-CHADSVASC: 4 ?-Current treatment: ?  Rate control: Diltiazem CD '120mg'$  24hr daily Appropriate, Effective, Safe, Accessible ?Anticoagulation: Eliquis 2.'5mg'$  BID Appropriate, Effective, Safe, Accessible ?-Medications previously tried: none noted ?-Home BP and HR readings: not checking at home  ?-Counseled on increased risk of stroke due to Afib and benefits of anticoagulation for stroke prevention; ?bleeding risk associated with Eliquis and importance of  self-monitoring for signs/symptoms of bleeding; ?-He is on appropriate dose of Eliquis due to age and Scr. ?-Recommend f/u on labs at next visit with Dr. [name]. ?-Recommended to continue current medication ? ?Update 10/20/21 ?Eliquis dose still appropriate based on renal function/age. ?Eliquis copay is affordable, no concerns at this time.  He is not monitoring BP at home.  Does mention occasional dizziness and headaches.  Occur randomly throughout the day no specific timing. ?Encouraged occasional BP checks if able. ?No further changes at this time.   ? ?Patient Goals/Self-Care Activities ?Patient will:  ?- take medications as prescribed ?focus on medication adherence by pill box ?check blood pressure as able, document, and provide at future appointments ? ?Follow Up Plan: The care management team will reach out to the patient again over the next 180 days.  ? ? ?  ? ?  ?  ? ?The patient verbalized understanding of instructions, educational materials, and care plan provided today and declined offer to receive copy of patient instructions, educational materials, and care plan.  ?Telephone follow up appointment with pharmacy team member scheduled for: 6 months ? ?Edythe Clarity, [facility]  ?Beverly Milch, PharmD ?Clinical Pharmacist  ?[place] ?([phone] ? ?

## 2021-10-28 ENCOUNTER — Other Ambulatory Visit: Payer: Self-pay | Admitting: Cardiology

## 2021-11-05 NOTE — Progress Notes (Signed)
? ?Phone [phone] ?In person visit ?  ?Subjective:  ? ?Mark Jackson. is a 88 y.o. year old very pleasant male patient who presents for/with See problem oriented charting ?Chief Complaint  ?Patient presents with  ? Follow-up  ? Hypertension  ? Hyperlipidemia  ? ?Past Medical History-  ?Patient Active Problem List  ? Diagnosis Date Noted  ? Chronic diastolic heart failure ([place]) 12/12/2019  ?  Priority: High  ? S/P TAVR (transcatheter aortic valve replacement) 12/12/2019  ?  Priority: High  ? CAD S/P DES PCI to proximal LAD 03/22/2015  ?  Priority: High  ? Paroxysmal atrial fibrillation ([place]); CHA2DSVasc - 4; Now on Eliquis 08/20/2014  ?  Priority: High  ?  Class: Diagnosis of  ? Chronic kidney disease (CKD), active medical management without dialysis, stage 4 (severe) ([place]) 08/20/2014  ?  Priority: High  ? Personal history of prostate cancer 10/22/2008  ?  Priority: High  ? Hyperglycemia 09/27/2017  ?  Priority: Medium   ? B12 deficiency 01/06/2017  ?  Priority: Medium   ? BPH associated with nocturia 06/15/2016  ?  Priority: Medium   ? Hereditary and idiopathic peripheral neuropathy 01/12/2014  ?  Priority: Medium   ? H/O syncope 09/03/2013  ?  Priority: Medium   ? Right-sided carotid artery disease; followed by Dr. Trula Slade 03/02/2013  ?  Priority: Medium   ? Hyperlipidemia with target LDL less than 70 03/02/2013  ?  Priority: Medium   ? Migraine without aura 10/26/2012  ?  Priority: Medium   ? Anemia 10/23/2008  ?  Priority: Medium   ? Essential hypertension 10/22/2008  ?  Priority: Medium   ? Excessive daytime sleepiness 10/22/2008  ?  Priority: Medium   ? Fatigue 11/08/2017  ?  Priority: Low  ? Chronic diarrhea 01/06/2017  ?  Priority: Low  ? Perianal dermatitis 06/19/2015  ?  Priority: Low  ? Rectal bleeding 04/25/2015  ?  Priority: Low  ? Long term current use of anticoagulant therapy 08/27/2014  ?  Priority: Low  ? Obesity (BMI 30-39.9) 09/03/2013  ?  Priority: Low  ? GLAUCOMA 10/23/2008  ?  Priority:  Low  ? Arthropathy 10/22/2008  ?  Priority: Low  ? History of colonic polyps 10/22/2008  ?  Priority: Low  ? Hematuria 12/11/2020  ? Insomnia, psychophysiological 02/28/2020  ? Severe aortic stenosis 12/12/2019  ? Syncope and collapse 11/27/2019  ? Bilateral lower extremity edema 04/15/2017  ? ? ?Medications- reviewed and updated ?Current Outpatient Medications  ?Medication Sig Dispense Refill  ? acetaminophen (TYLENOL) 325 MG tablet Take 325-650 mg by mouth every 6 (six) hours as needed for moderate pain or headache.     ? amoxicillin (AMOXIL) 500 MG tablet Take 2,000 mg (4 capsules) 1 hour prior to all dental visits. 8 tablet 11  ? diltiazem (CARDIZEM CD) 120 MG 24 hr capsule TAKE (1) CAPSULE DAILY. 90 capsule 1  ? ELIQUIS 2.5 MG TABS tablet TAKE ONE TABLET BY MOUTH TWICE DAILY 180 tablet 1  ? ferrous sulfate 325 (65 FE) MG tablet Take 325 mg by mouth daily with breakfast.    ? furosemide (LASIX) 40 MG tablet Take 0.5 tablets (20 mg total) by mouth daily.    ? guaiFENesin (MUCINEX) 600 MG 12 hr tablet Take by mouth 2 (two) times daily.    ? isosorbide mononitrate (IMDUR) 60 MG 24 hr tablet TAKE ONE TAB DAILY AFTER BREAKFAST. MAY TAKE AN ADDITONAL TAB IN EVENING X2DAYS IF  USE NITROGLYCER 120 tablet 3  ? loperamide (IMODIUM A-D) 2 MG tablet Take 2 mg by mouth 4 (four) times daily as needed for diarrhea or loose stools.    ? Multiple Vitamin (MULTIVITAMIN PO) Take 1 tablet by mouth daily.     ? nitroGLYCERIN (NITROSTAT) 0.4 MG SL tablet ONE TABLET UNDER TONGUE WHEN NEEDED FOR CHEST PAIN. MAY REPEAT IN 5 MINUTES. 25 tablet 0  ? rosuvastatin (CRESTOR) 20 MG tablet TAKE (1) TABLET DAILY AT BEDTIME. 90 tablet 1  ? topiramate (TOPAMAX) 100 MG tablet Take 1.5 tablets (150 mg total) by mouth daily. 135 tablet 3  ? vitamin B-12 (CYANOCOBALAMIN) 1000 MCG tablet Take 1,000 mcg by mouth daily.    ? ?No current facility-administered medications for this visit.  ? ?  ?Objective:  ?BP 122/70   Pulse 78   Temp 98.7 ?F (37.1 ?C)    Ht '5\' 8"'$  (1.727 m)   Wt 183 lb 9.6 oz (83.3 kg)   SpO2 98%   BMI 27.92 kg/m?  ?Gen: NAD, resting comfortably ?CV: RRR no murmurs rubs or gallops ?Lungs: CTAB no crackles, wheeze, rhonchi ?Ext: 1+ edema ?Skin: warm, dry ? ?  ? ?Assessment and Plan  ? ?#Chronic fatigue/heavy snoring/excessive daytime sleepiness ?#orthostatic intolerance and syncopal event related to this ?S: Today patient reports ongoing fatigue. A lot of daytime sleepiness still- feels better if he takes a nap early in the morning sitting up- does not feel as rested laying down. Sleeping 11-12 hours a day.  ?- has had some falls when not using cane- encouraged consistency. Still getting some lighthededness with standing ? ?Patient was seen on 04/10/2021 by Dr. Venetia Maxon was concern for myelodysplastic syndrome-plan ultimately list for active surveillance due to mild anemia and thrombocytopenia- continues to be monitored. ? ?He believes he snores (was told in past)- open to pulmology referral now ?A/P: Patient with ongoing fatigue but largely stable blood work-we discussed further evaluating with sleep apnea testing-he is open to this and was referred today ?- continues to follow up with hematology- thrombocytopenia largely stable- update with labs today ?  ?# Atrial fibrillation ?S: Rate controlled with eliquis 2.5 mg BID ?Anticoagulated with diltiazem 120 mg XR ?Patient is  followed by cardiology: Dr. Ellyn Hack  ?A/P: Appropriately anticoagulated and rate controlled-continue current medication ?  ?#hypertension ?S: medication: Lasix 40 mg daily- takes half tablet, diltiazem 120 mg extended release daily, Imdur 60 mg daily after breakfast- not needing 2nd tablet ?BP Readings from Last 3 Encounters:  ?12/11/21 122/70  ?10/08/21 (!) 143/67  ?09/19/21 (!) 152/88  ?A/P: Blood pressure remains controlled-continues to have some orthostatic symptoms-we opted to continue current medicine and focus on using cane consistently to avoid  falls ? ?#hyperlipidemia ?S: Medication:rosuvastatin 20 mg  ?Lab Results  ?Component Value Date  ? CHOL 91 11/29/2019  ? HDL 35 (L) 11/29/2019  ? [place] 46 11/29/2019  ? LDLDIRECT 52.0 09/12/2018  ? TRIG 49 11/29/2019  ? CHOLHDL 2.6 11/29/2019  ? A/P: Excellent control on last check but is certainly overdue-update full lipid panel with labs today and continue current medicine for now ? ?# B12 deficiency ?S: Current treatment/medication (oral vs. IM): b12 1000 mcg daily  ?Lab Results  ?Component Value Date  ? [id number] 02/06/2021  ?A/P: hopefully stable- update b12 today. Continue current meds for now ?   ?#Chronic kidney disease stage IV- sees Dr. Moshe Cipro every 6 months ?S: GFR is typically in the high teens or 20s range ?- edema was  noted of lower extremities while on Lasix by Dr. Ellyn Hack ?A/P: has been stable- will update CMP today and reports has follow up soon with [facility] ?  ?# Migraine without Aura ?S:medication:Topamax 150 mg- had first headache in a long time that kept him out of work on Monday when tried to cut back. Tylenol still 2-4x a week (above advised)  ?  ?MRI performed 02/08/21 without significant findings.  See impression above ?A/P: overall stable when taking full dose topamax- encouraged to continue ? ?# Hyperglycemia/insulin resistance/prediabetes ?S:  Medication: none ?Lab Results  ?Component Value Date  ? HGBA1C 5.6 07/19/2020  ? HGBA1C 6.1 (H) 12/08/2019  ? HGBA1C 6.0 03/16/2019  ? A/P: improved last check but overdue- update a1c with labs ? ?# ? ?Recommended follow up: No follow-ups on file. ?Future Appointments  ?Date Time Provider [place]  ?03/11/2022 10:00 AM [facility] None  ?03/11/2022 10:30 AM Shadad, Mathis Dad, MD [facility] None  ?04/28/2022  3:45 PM [facility] CCM PHARMACIST [facility]  ?12/11/2022  1:00 PM [facility] HEALTH COACH [facility]  ? ? ?Lab/Order associations: ?  ICD-10-CM   ?1. Paroxysmal atrial fibrillation ([place]); CHA2DSVasc - 4; Now  on Eliquis  I48.0   ?  ?2. Essential hypertension  I10   ?  ?3. Hyperlipidemia with target LDL less than 70  E78.5 CBC with Differential/Platelet  ?  Comprehensive metabolic panel  ?  Lipid panel  ?  ?4. Migraine without aura

## 2021-11-06 DIAGNOSIS — L565 Disseminated superficial actinic porokeratosis (DSAP): Secondary | ICD-10-CM | POA: Diagnosis not present

## 2021-11-06 DIAGNOSIS — D485 Neoplasm of uncertain behavior of skin: Secondary | ICD-10-CM | POA: Diagnosis not present

## 2021-11-06 DIAGNOSIS — D0439 Carcinoma in situ of skin of other parts of face: Secondary | ICD-10-CM | POA: Diagnosis not present

## 2021-11-06 DIAGNOSIS — L821 Other seborrheic keratosis: Secondary | ICD-10-CM | POA: Diagnosis not present

## 2021-11-06 DIAGNOSIS — D692 Other nonthrombocytopenic purpura: Secondary | ICD-10-CM | POA: Diagnosis not present

## 2021-11-06 DIAGNOSIS — Z85828 Personal history of other malignant neoplasm of skin: Secondary | ICD-10-CM | POA: Diagnosis not present

## 2021-11-07 DIAGNOSIS — E785 Hyperlipidemia, unspecified: Secondary | ICD-10-CM

## 2021-11-07 DIAGNOSIS — I10 Essential (primary) hypertension: Secondary | ICD-10-CM | POA: Diagnosis not present

## 2021-11-07 DIAGNOSIS — I480 Paroxysmal atrial fibrillation: Secondary | ICD-10-CM

## 2021-11-27 ENCOUNTER — Ambulatory Visit: Payer: Medicare Other

## 2021-11-28 ENCOUNTER — Ambulatory Visit (INDEPENDENT_AMBULATORY_CARE_PROVIDER_SITE_OTHER): Payer: Medicare Other

## 2021-11-28 DIAGNOSIS — Z0000 Encounter for general adult medical examination without abnormal findings: Secondary | ICD-10-CM
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
## 2021-11-28 NOTE — Patient Instructions (Addendum)
Mr. Schreur , ?Thank you for taking time to come for your Medicare Wellness Visit. I appreciate your ongoing commitment to your health goals. Please review the following plan we discussed and let me know if I can assist you in the future.  ? ?Screening recommendations/referrals: ?Colonoscopy: No Longer required  ?Recommended yearly ophthalmology/optometry visit for glaucoma screening and checkup ?Recommended yearly dental visit for hygiene and checkup ? ?Vaccinations: ?Influenza vaccine: Up to date per pt ?Pneumococcal vaccine: Up to date ?Tdap vaccine: Done 03/02/19 repeat every 10 years  ?Shingles vaccine: Completed 7/15, 05/17/18   ?Covid-19: Completed 8/5, 04/11/20 & 10/24/20 ? ?Advanced directives: Please bring a copy of your health care power of attorney and living will to the office at your convenience. ? ?Conditions/risks identified: None at this time  ? ?Next appointment: Follow up in one year for your annual wellness visit.  ? ?Preventive Care 34 Years and Older, Male ?Preventive care refers to lifestyle choices and visits with your health care provider that can promote health and wellness. ?What does preventive care include? ?A yearly physical exam. This is also called an annual well check. ?Dental exams once or twice a year. ?Routine eye exams. Ask your health care provider how often you should have your eyes checked. ?Personal lifestyle choices, including: ?Daily care of your teeth and gums. ?Regular physical activity. ?Eating a healthy diet. ?Avoiding tobacco and drug use. ?Limiting alcohol use. ?Practicing safe sex. ?Taking low doses of aspirin every day. ?Taking vitamin and mineral supplements as recommended by your health care provider. ?What happens during an annual well check? ?The services and screenings done by your health care provider during your annual well check will depend on your age, overall health, lifestyle risk factors, and family history of disease. ?Counseling  ?Your health care provider  may ask you questions about your: ?Alcohol use. ?Tobacco use. ?Drug use. ?Emotional well-being. ?Home and relationship well-being. ?Sexual activity. ?Eating habits. ?History of falls. ?Memory and ability to understand (cognition). ?Work and work Statistician. ?Screening  ?You may have the following tests or measurements: ?Height, weight, and BMI. ?Blood pressure. ?Lipid and cholesterol levels. These may be checked every 5 years, or more frequently if you are over 49 years old. ?Skin check. ?Lung cancer screening. You may have this screening every year starting at age 25 if you have a 30-pack-year history of smoking and currently smoke or have quit within the past 15 years. ?Fecal occult blood test (FOBT) of the stool. You may have this test every year starting at age 3. ?Flexible sigmoidoscopy or colonoscopy. You may have a sigmoidoscopy every 5 years or a colonoscopy every 10 years starting at age 25. ?Prostate cancer screening. Recommendations will vary depending on your family history and other risks. ?Hepatitis C blood test. ?Hepatitis B blood test. ?Sexually transmitted disease (STD) testing. ?Diabetes screening. This is done by checking your blood sugar (glucose) after you have not eaten for a while (fasting). You may have this done every 1-3 years. ?Abdominal aortic aneurysm (AAA) screening. You may need this if you are a current or former smoker. ?Osteoporosis. You may be screened starting at age 38 if you are at high risk. ?Talk with your health care provider about your test results, treatment options, and if necessary, the need for more tests. ?Vaccines  ?Your health care provider may recommend certain vaccines, such as: ?Influenza vaccine. This is recommended every year. ?Tetanus, diphtheria, and acellular pertussis (Tdap, Td) vaccine. You may need a Td booster every 10 years. ?  Zoster vaccine. You may need this after age 57. ?Pneumococcal 13-valent conjugate (PCV13) vaccine. One dose is recommended after  age 23. ?Pneumococcal polysaccharide (PPSV23) vaccine. One dose is recommended after age 16. ?Talk to your health care provider about which screenings and vaccines you need and how often you need them. ?This information is not intended to replace advice given to you by your health care provider. Make sure you discuss any questions you have with your health care provider. ?Document Released: 08/23/2015 Document Revised: 04/15/2016 Document Reviewed: 05/28/2015 ?Elsevier Interactive Patient Education ? 2017 [place]. ? ?Fall Prevention in the Home ?Falls can cause injuries. They can happen to people of all ages. There are many things you can do to make your home safe and to help prevent falls. ?What can I do on the outside of my home? ?Regularly fix the edges of walkways and driveways and fix any cracks. ?Remove anything that might make you trip as you walk through a door, such as a raised step or threshold. ?Trim any bushes or trees on the path to your home. ?Use bright outdoor lighting. ?Clear any walking paths of anything that might make someone trip, such as rocks or tools. ?Regularly check to see if handrails are loose or broken. Make sure that both sides of any steps have handrails. ?Any raised decks and porches should have guardrails on the edges. ?Have any leaves, snow, or ice cleared regularly. ?Use sand or salt on walking paths during winter. ?Clean up any spills in your garage right away. This includes oil or grease spills. ?What can I do in the bathroom? ?Use night lights. ?Install grab bars by the toilet and in the tub and shower. Do not use towel bars as grab bars. ?Use non-skid mats or decals in the tub or shower. ?If you need to sit down in the shower, use a plastic, non-slip stool. ?Keep the floor dry. Clean up any water that spills on the floor as soon as it happens. ?Remove soap buildup in the tub or shower regularly. ?Attach bath mats securely with double-sided non-slip rug tape. ?Do not have  throw rugs and other things on the floor that can make you trip. ?What can I do in the bedroom? ?Use night lights. ?Make sure that you have a light by your bed that is easy to reach. ?Do not use any sheets or blankets that are too big for your bed. They should not hang down onto the floor. ?Have a firm chair that has side arms. You can use this for support while you get dressed. ?Do not have throw rugs and other things on the floor that can make you trip. ?What can I do in the kitchen? ?Clean up any spills right away. ?Avoid walking on wet floors. ?Keep items that you use a lot in easy-to-reach places. ?If you need to reach something above you, use a strong step stool that has a grab bar. ?Keep electrical cords out of the way. ?Do not use floor polish or wax that makes floors slippery. If you must use wax, use non-skid floor wax. ?Do not have throw rugs and other things on the floor that can make you trip. ?What can I do with my stairs? ?Do not leave any items on the stairs. ?Make sure that there are handrails on both sides of the stairs and use them. Fix handrails that are broken or loose. Make sure that handrails are as long as the stairways. ?Check any carpeting to make sure that  it is firmly attached to the stairs. Fix any carpet that is loose or worn. ?Avoid having throw rugs at the top or bottom of the stairs. If you do have throw rugs, attach them to the floor with carpet tape. ?Make sure that you have a light switch at the top of the stairs and the bottom of the stairs. If you do not have them, ask someone to add them for you. ?What else can I do to help prevent falls? ?Wear shoes that: ?Do not have high heels. ?Have rubber bottoms. ?Are comfortable and fit you well. ?Are closed at the toe. Do not wear sandals. ?If you use a stepladder: ?Make sure that it is fully opened. Do not climb a closed stepladder. ?Make sure that both sides of the stepladder are locked into place. ?Ask someone to hold it for you, if  possible. ?Clearly mark and make sure that you can see: ?Any grab bars or handrails. ?First and last steps. ?Where the edge of each step is. ?Use tools that help you move around (mobility aids) if they a

## 2021-12-10 ENCOUNTER — Other Ambulatory Visit: Payer: Self-pay | Admitting: Cardiology

## 2021-12-11 ENCOUNTER — Encounter: Payer: Self-pay | Admitting: Family Medicine

## 2021-12-11 ENCOUNTER — Ambulatory Visit (INDEPENDENT_AMBULATORY_CARE_PROVIDER_SITE_OTHER): Payer: Medicare Other | Admitting: Family Medicine

## 2021-12-11 VITALS — BP 122/70 | HR 78 | Temp 98.7°F | Ht 68.0 in | Wt 183.6 lb

## 2021-12-11 DIAGNOSIS — G43009 Migraine without aura, not intractable, without status migrainosus: Secondary | ICD-10-CM

## 2021-12-11 DIAGNOSIS — E538 Deficiency of other specified B group vitamins: Secondary | ICD-10-CM

## 2021-12-11 DIAGNOSIS — R0683 Snoring: Secondary | ICD-10-CM

## 2021-12-11 DIAGNOSIS — N184 Chronic kidney disease, stage 4 (severe): Secondary | ICD-10-CM | POA: Diagnosis not present

## 2021-12-11 DIAGNOSIS — I480 Paroxysmal atrial fibrillation: Secondary | ICD-10-CM

## 2021-12-11 DIAGNOSIS — G4719 Other hypersomnia: Secondary | ICD-10-CM

## 2021-12-11 DIAGNOSIS — I25119 Atherosclerotic heart disease of native coronary artery with unspecified angina pectoris: Secondary | ICD-10-CM | POA: Diagnosis not present

## 2021-12-11 DIAGNOSIS — R739 Hyperglycemia, unspecified: Secondary | ICD-10-CM

## 2021-12-11 DIAGNOSIS — I10 Essential (primary) hypertension: Secondary | ICD-10-CM | POA: Diagnosis not present

## 2021-12-11 DIAGNOSIS — E785 Hyperlipidemia, unspecified: Secondary | ICD-10-CM | POA: Diagnosis not present

## 2021-12-11 DIAGNOSIS — D696 Thrombocytopenia, unspecified: Secondary | ICD-10-CM | POA: Diagnosis not present

## 2021-12-11 LAB — CBC WITH DIFFERENTIAL/PLATELET
Basophils Absolute: 0.1 10*3/uL (ref 0.0–0.1)
Basophils Relative: 0.6 % (ref 0.0–3.0)
Eosinophils Absolute: 0.3 10*3/uL (ref 0.0–0.7)
Eosinophils Relative: 2.5 % (ref 0.0–5.0)
HCT: 33.6 % — ABNORMAL LOW (ref 39.0–52.0)
Hemoglobin: 11.2 g/dL — ABNORMAL LOW (ref 13.0–17.0)
Lymphocytes Relative: 11.3 % — ABNORMAL LOW (ref 12.0–46.0)
Lymphs Abs: 1.1 10*3/uL (ref 0.7–4.0)
MCHC: 33.3 g/dL (ref 30.0–36.0)
MCV: 100.5 fl — ABNORMAL HIGH (ref 78.0–100.0)
Monocytes Absolute: 1 10*3/uL (ref 0.1–1.0)
Monocytes Relative: 10 % (ref 3.0–12.0)
Neutro Abs: 7.7 10*3/uL (ref 1.4–7.7)
Neutrophils Relative %: 75.6 % (ref 43.0–77.0)
Platelets: 145 10*3/uL — ABNORMAL LOW (ref 150.0–400.0)
RBC: 3.34 Mil/uL — ABNORMAL LOW (ref 4.22–5.81)
RDW: 14.2 % (ref 11.5–15.5)
WBC: 10.2 10*3/uL (ref 4.0–10.5)

## 2021-12-11 LAB — COMPREHENSIVE METABOLIC PANEL
ALT: 10 U/L (ref 0–53)
AST: 18 U/L (ref 0–37)
Albumin: 3.6 g/dL (ref 3.5–5.2)
Alkaline Phosphatase: 56 U/L (ref 39–117)
BUN: 64 mg/dL — ABNORMAL HIGH (ref 6–23)
CO2: 24 mEq/L (ref 19–32)
Calcium: 8.4 mg/dL (ref 8.4–10.5)
Chloride: 109 mEq/L (ref 96–112)
Creatinine, Ser: 2.94 mg/dL — ABNORMAL HIGH (ref 0.40–1.50)
GFR: 18.31 mL/min — ABNORMAL LOW (ref >60.00)
Glucose, Bld: 96 mg/dL (ref 70–99)
Potassium: 4.8 mEq/L (ref 3.5–5.1)
Sodium: 140 mEq/L (ref 135–145)
Total Bilirubin: 0.4 mg/dL (ref 0.2–1.2)
Total Protein: 6.3 g/dL (ref 6.0–8.3)

## 2021-12-11 LAB — VITAMIN B12: Vitamin B-12: 275 pg/mL (ref 211–911)

## 2021-12-11 LAB — LIPID PANEL
Cholesterol: 148 mg/dL (ref 0–200)
HDL: 42.1 mg/dL (ref >39.00)
LDL Cholesterol: 86 mg/dL (ref 0–99)
NonHDL: 105.87
Total CHOL/HDL Ratio: 4
Triglycerides: 97 mg/dL (ref 0.0–149.0)
VLDL: 19.4 mg/dL (ref 0.0–40.0)

## 2021-12-11 LAB — HEMOGLOBIN A1C: Hgb A1c MFr Bld: 5.8 % (ref 4.6–6.5)

## 2021-12-11 NOTE — Patient Instructions (Addendum)
We will call you within two weeks about your referral to pulmonology for sleep study. If you do not hear within 2 weeks, give [place] a call.  ? ?Please stop by lab before you go ?If you have mychart- we will send your results within 3 business days of [place] receiving them.  ?If you do not have mychart- we will call you about results within 5 business days of [place] receiving them.  ?*please also note that you will see labs on mychart as soon as they post. I will later go in and write notes on them- will say "notes from Dr. [name]" ? ?Recommended follow up: Return in about 6 months (around 06/13/2022) for followup or sooner if needed.Schedule b4 you leave. ?

## 2021-12-18 ENCOUNTER — Telehealth: Payer: Self-pay

## 2021-12-18 NOTE — Chronic Care Management (AMB) (Signed)
?  Chronic Care Management  ? ?Note ? ?12/18/2021 ?Name: Mark Jackson. MRN: [id number] DOB: 09/10/1929 ? ?Mark Jackson. is a 88 y.o. year old male who is a primary care patient of [name], [name], MD. Mark Jackson. is currently enrolled in care management services. An additional referral for RNCM  was placed.  ? ?Follow up plan: ?Patient declines further RNCM engagement by the care management team. Appropriate care team members and provider have been notified via electronic communication.  ? ?Noreene Larsson, RMA ?Care Guide, Embedded Care Coordination ?[facility]  ?[place], [place] [address] ?Direct Dial: [phone] ?[email or web address] ?Website: [email or web address]  ? ?

## 2021-12-22 ENCOUNTER — Telehealth: Payer: Self-pay

## 2021-12-22 NOTE — Telephone Encounter (Signed)
Patient states it has been several months since last B12 and would like to schedule.    Please advise.

## 2021-12-22 NOTE — Telephone Encounter (Signed)
Ok for b12 or does pt need b12 lab first? ? ?

## 2021-12-22 NOTE — Telephone Encounter (Signed)
Please reference result note which team commented on 12/15/21- ideally would speak to patient when there are follow up items on labs to make sure to cut down on follow up phone calls ?

## 2021-12-23 ENCOUNTER — Ambulatory Visit (INDEPENDENT_AMBULATORY_CARE_PROVIDER_SITE_OTHER): Payer: Medicare Other

## 2021-12-23 DIAGNOSIS — E538 Deficiency of other specified B group vitamins: Secondary | ICD-10-CM | POA: Diagnosis not present

## 2021-12-23 MED ORDER — CYANOCOBALAMIN 1000 MCG/ML IJ SOLN
1000.0000 ug | Freq: Once | INTRAMUSCULAR | Status: AC
  Administered 2021-12-23: 1000 ug via INTRAMUSCULAR

## 2021-12-23 NOTE — Telephone Encounter (Signed)
Ok to schedule b12 injection. ?

## 2021-12-23 NOTE — Progress Notes (Signed)
Mark Jackson. 88 yr old male presents to office today for weekly B12 injection per [name], MD. Administered CYANOCOBALAMIN 1,000 mcg IM right arm. Patient tolerated well.  ?

## 2021-12-23 NOTE — Telephone Encounter (Signed)
Patient scheduled.

## 2021-12-26 ENCOUNTER — Telehealth: Payer: Self-pay | Admitting: Family Medicine

## 2021-12-26 ENCOUNTER — Emergency Department (HOSPITAL_COMMUNITY): Payer: Medicare Other

## 2021-12-26 ENCOUNTER — Observation Stay (HOSPITAL_COMMUNITY): Payer: Medicare Other

## 2021-12-26 ENCOUNTER — Inpatient Hospital Stay (HOSPITAL_COMMUNITY)
Admission: EM | Admit: 2021-12-26 | Discharge: 2021-12-29 | DRG: 065 | Disposition: A | Discharge status: Rehabilitation Facility | Payer: Medicare Other | Attending: Family Medicine | Admitting: Family Medicine

## 2021-12-26 DIAGNOSIS — I639 Cerebral infarction, unspecified: Secondary | ICD-10-CM | POA: Diagnosis not present

## 2021-12-26 DIAGNOSIS — G8929 Other chronic pain: Secondary | ICD-10-CM | POA: Diagnosis not present

## 2021-12-26 DIAGNOSIS — I6389 Other cerebral infarction: Secondary | ICD-10-CM | POA: Diagnosis not present

## 2021-12-26 DIAGNOSIS — Q046 Congenital cerebral cysts: Secondary | ICD-10-CM | POA: Diagnosis not present

## 2021-12-26 DIAGNOSIS — G8191 Hemiplegia, unspecified affecting right dominant side: Secondary | ICD-10-CM | POA: Diagnosis present

## 2021-12-26 DIAGNOSIS — G43909 Migraine, unspecified, not intractable, without status migrainosus: Secondary | ICD-10-CM | POA: Diagnosis present

## 2021-12-26 DIAGNOSIS — I480 Paroxysmal atrial fibrillation: Secondary | ICD-10-CM | POA: Diagnosis present

## 2021-12-26 DIAGNOSIS — Z955 Presence of coronary angioplasty implant and graft: Secondary | ICD-10-CM

## 2021-12-26 DIAGNOSIS — R4781 Slurred speech: Secondary | ICD-10-CM | POA: Diagnosis present

## 2021-12-26 DIAGNOSIS — Z8546 Personal history of malignant neoplasm of prostate: Secondary | ICD-10-CM

## 2021-12-26 DIAGNOSIS — R270 Ataxia, unspecified: Secondary | ICD-10-CM | POA: Diagnosis present

## 2021-12-26 DIAGNOSIS — N401 Enlarged prostate with lower urinary tract symptoms: Secondary | ICD-10-CM | POA: Diagnosis present

## 2021-12-26 DIAGNOSIS — E669 Obesity, unspecified: Secondary | ICD-10-CM | POA: Diagnosis not present

## 2021-12-26 DIAGNOSIS — I25119 Atherosclerotic heart disease of native coronary artery with unspecified angina pectoris: Secondary | ICD-10-CM | POA: Diagnosis not present

## 2021-12-26 DIAGNOSIS — Z79899 Other long term (current) drug therapy: Secondary | ICD-10-CM | POA: Diagnosis not present

## 2021-12-26 DIAGNOSIS — I5032 Chronic diastolic (congestive) heart failure: Secondary | ICD-10-CM | POA: Diagnosis not present

## 2021-12-26 DIAGNOSIS — I5033 Acute on chronic diastolic (congestive) heart failure: Secondary | ICD-10-CM | POA: Diagnosis present

## 2021-12-26 DIAGNOSIS — Z8041 Family history of malignant neoplasm of ovary: Secondary | ICD-10-CM | POA: Diagnosis not present

## 2021-12-26 DIAGNOSIS — N400 Enlarged prostate without lower urinary tract symptoms: Secondary | ICD-10-CM | POA: Diagnosis present

## 2021-12-26 DIAGNOSIS — I6381 Other cerebral infarction due to occlusion or stenosis of small artery: Secondary | ICD-10-CM | POA: Diagnosis not present

## 2021-12-26 DIAGNOSIS — I739 Peripheral vascular disease, unspecified: Secondary | ICD-10-CM | POA: Diagnosis present

## 2021-12-26 DIAGNOSIS — I10 Essential (primary) hypertension: Secondary | ICD-10-CM | POA: Diagnosis not present

## 2021-12-26 DIAGNOSIS — D631 Anemia in chronic kidney disease: Secondary | ICD-10-CM | POA: Diagnosis present

## 2021-12-26 DIAGNOSIS — N184 Chronic kidney disease, stage 4 (severe): Secondary | ICD-10-CM | POA: Diagnosis present

## 2021-12-26 DIAGNOSIS — Z952 Presence of prosthetic heart valve: Secondary | ICD-10-CM

## 2021-12-26 DIAGNOSIS — R29818 Other symptoms and signs involving the nervous system: Secondary | ICD-10-CM | POA: Diagnosis not present

## 2021-12-26 DIAGNOSIS — Z87891 Personal history of nicotine dependence: Secondary | ICD-10-CM

## 2021-12-26 DIAGNOSIS — I63542 Cerebral infarction due to unspecified occlusion or stenosis of left cerebellar artery: Principal | ICD-10-CM | POA: Diagnosis present

## 2021-12-26 DIAGNOSIS — R351 Nocturia: Secondary | ICD-10-CM | POA: Diagnosis present

## 2021-12-26 DIAGNOSIS — I69398 Other sequelae of cerebral infarction: Secondary | ICD-10-CM | POA: Diagnosis present

## 2021-12-26 DIAGNOSIS — E785 Hyperlipidemia, unspecified: Secondary | ICD-10-CM | POA: Diagnosis not present

## 2021-12-26 DIAGNOSIS — D539 Nutritional anemia, unspecified: Secondary | ICD-10-CM | POA: Diagnosis present

## 2021-12-26 DIAGNOSIS — I779 Disorder of arteries and arterioles, unspecified: Secondary | ICD-10-CM | POA: Diagnosis present

## 2021-12-26 DIAGNOSIS — Z8043 Family history of malignant neoplasm of testis: Secondary | ICD-10-CM | POA: Diagnosis not present

## 2021-12-26 DIAGNOSIS — I130 Hypertensive heart and chronic kidney disease with heart failure and stage 1 through stage 4 chronic kidney disease, or unspecified chronic kidney disease: Secondary | ICD-10-CM | POA: Diagnosis present

## 2021-12-26 DIAGNOSIS — I2510 Atherosclerotic heart disease of native coronary artery without angina pectoris: Secondary | ICD-10-CM | POA: Diagnosis present

## 2021-12-26 DIAGNOSIS — Z7901 Long term (current) use of anticoagulants: Secondary | ICD-10-CM | POA: Diagnosis not present

## 2021-12-26 DIAGNOSIS — D649 Anemia, unspecified: Secondary | ICD-10-CM | POA: Diagnosis not present

## 2021-12-26 DIAGNOSIS — Z7902 Long term (current) use of antithrombotics/antiplatelets: Secondary | ICD-10-CM | POA: Diagnosis not present

## 2021-12-26 DIAGNOSIS — I252 Old myocardial infarction: Secondary | ICD-10-CM | POA: Diagnosis not present

## 2021-12-26 DIAGNOSIS — R55 Syncope and collapse: Secondary | ICD-10-CM | POA: Diagnosis present

## 2021-12-26 DIAGNOSIS — R4182 Altered mental status, unspecified: Secondary | ICD-10-CM | POA: Diagnosis not present

## 2021-12-26 DIAGNOSIS — Z85828 Personal history of other malignant neoplasm of skin: Secondary | ICD-10-CM | POA: Diagnosis not present

## 2021-12-26 DIAGNOSIS — W57XXXA Bitten or stung by nonvenomous insect and other nonvenomous arthropods, initial encounter: Secondary | ICD-10-CM

## 2021-12-26 DIAGNOSIS — M1712 Unilateral primary osteoarthritis, left knee: Secondary | ICD-10-CM | POA: Diagnosis not present

## 2021-12-26 DIAGNOSIS — M11262 Other chondrocalcinosis, left knee: Secondary | ICD-10-CM | POA: Diagnosis not present

## 2021-12-26 DIAGNOSIS — I350 Nonrheumatic aortic (valve) stenosis: Secondary | ICD-10-CM | POA: Diagnosis present

## 2021-12-26 DIAGNOSIS — I25118 Atherosclerotic heart disease of native coronary artery with other forms of angina pectoris: Secondary | ICD-10-CM | POA: Diagnosis present

## 2021-12-26 DIAGNOSIS — Z8673 Personal history of transient ischemic attack (TIA), and cerebral infarction without residual deficits: Secondary | ICD-10-CM

## 2021-12-26 DIAGNOSIS — M25562 Pain in left knee: Secondary | ICD-10-CM | POA: Diagnosis not present

## 2021-12-26 DIAGNOSIS — I69351 Hemiplegia and hemiparesis following cerebral infarction affecting right dominant side: Secondary | ICD-10-CM | POA: Diagnosis not present

## 2021-12-26 DIAGNOSIS — Z7982 Long term (current) use of aspirin: Secondary | ICD-10-CM

## 2021-12-26 DIAGNOSIS — I482 Chronic atrial fibrillation, unspecified: Secondary | ICD-10-CM | POA: Diagnosis not present

## 2021-12-26 DIAGNOSIS — I509 Heart failure, unspecified: Secondary | ICD-10-CM | POA: Diagnosis not present

## 2021-12-26 DIAGNOSIS — R531 Weakness: Secondary | ICD-10-CM | POA: Diagnosis not present

## 2021-12-26 HISTORY — DX: Personal history of transient ischemic attack (TIA), and cerebral infarction without residual deficits: Z86.73

## 2021-12-26 LAB — URINALYSIS, ROUTINE W REFLEX MICROSCOPIC
Bilirubin Urine: NEGATIVE
Glucose, UA: NEGATIVE mg/dL
Hgb urine dipstick: NEGATIVE
Ketones, ur: NEGATIVE mg/dL
Leukocytes,Ua: NEGATIVE
Nitrite: NEGATIVE
Protein, ur: NEGATIVE mg/dL
Specific Gravity, Urine: 1.012 (ref 1.005–1.030)
pH: 7 (ref 5.0–8.0)

## 2021-12-26 LAB — COMPREHENSIVE METABOLIC PANEL
ALT: 13 U/L (ref 0–44)
AST: 22 U/L (ref 15–41)
Albumin: 3.4 g/dL — ABNORMAL LOW (ref 3.5–5.0)
Alkaline Phosphatase: 51 U/L (ref 38–126)
Anion gap: 8 (ref 5–15)
BUN: 61 mg/dL — ABNORMAL HIGH (ref 8–23)
CO2: 18 mmol/L — ABNORMAL LOW (ref 22–32)
Calcium: 8.6 mg/dL — ABNORMAL LOW (ref 8.9–10.3)
Chloride: 115 mmol/L — ABNORMAL HIGH (ref 98–111)
Creatinine, Ser: 2.88 mg/dL — ABNORMAL HIGH (ref 0.61–1.24)
GFR, Estimated: 20 mL/min — ABNORMAL LOW (ref >60)
Glucose, Bld: 99 mg/dL (ref 70–99)
Potassium: 4.3 mmol/L (ref 3.5–5.1)
Sodium: 141 mmol/L (ref 135–145)
Total Bilirubin: 0.5 mg/dL (ref 0.3–1.2)
Total Protein: 6.6 g/dL (ref 6.5–8.1)

## 2021-12-26 LAB — PROTIME-INR
INR: 1.1 (ref 0.8–1.2)
Prothrombin Time: 14.3 seconds (ref 11.4–15.2)

## 2021-12-26 LAB — CBC WITH DIFFERENTIAL/PLATELET
Abs Immature Granulocytes: 0.04 10*3/uL (ref 0.00–0.07)
Basophils Absolute: 0.1 10*3/uL (ref 0.0–0.1)
Basophils Relative: 0 %
Eosinophils Absolute: 0.2 10*3/uL (ref 0.0–0.5)
Eosinophils Relative: 2 %
HCT: 37 % — ABNORMAL LOW (ref 39.0–52.0)
Hemoglobin: 11.8 g/dL — ABNORMAL LOW (ref 13.0–17.0)
Immature Granulocytes: 0 %
Lymphocytes Relative: 12 %
Lymphs Abs: 1.3 10*3/uL (ref 0.7–4.0)
MCH: 33.2 pg (ref 26.0–34.0)
MCHC: 31.9 g/dL (ref 30.0–36.0)
MCV: 104.2 fL — ABNORMAL HIGH (ref 80.0–100.0)
Monocytes Absolute: 1.2 10*3/uL — ABNORMAL HIGH (ref 0.1–1.0)
Monocytes Relative: 10 %
Neutro Abs: 8.6 10*3/uL — ABNORMAL HIGH (ref 1.7–7.7)
Neutrophils Relative %: 76 %
Platelets: 126 10*3/uL — ABNORMAL LOW (ref 150–400)
RBC: 3.55 MIL/uL — ABNORMAL LOW (ref 4.22–5.81)
RDW: 13.2 % (ref 11.5–15.5)
WBC: 11.4 10*3/uL — ABNORMAL HIGH (ref 4.0–10.5)
nRBC: 0 % (ref 0.0–0.2)

## 2021-12-26 LAB — APTT: aPTT: 30 seconds (ref 24–36)

## 2021-12-26 LAB — TROPONIN I (HIGH SENSITIVITY)
Troponin I (High Sensitivity): 23 ng/L — ABNORMAL HIGH (ref <18)
Troponin I (High Sensitivity): 26 ng/L — ABNORMAL HIGH (ref <18)

## 2021-12-26 MED ORDER — STROKE: EARLY STAGES OF RECOVERY BOOK: Freq: Once | Status: AC

## 2021-12-26 MED ORDER — LOPERAMIDE HCL 2 MG PO CAPS: 2.0000 mg | ORAL_CAPSULE | Freq: Four times a day (QID) | ORAL | Status: DC | PRN

## 2021-12-26 MED ORDER — FUROSEMIDE 20 MG PO TABS
20.0000 mg | ORAL_TABLET | Freq: Every day | ORAL | Status: DC
Start: 2021-12-27 — End: 2021-12-26

## 2021-12-26 MED ORDER — APIXABAN 2.5 MG PO TABS: 2.5000 mg | ORAL_TABLET | Freq: Two times a day (BID) | ORAL | Status: DC

## 2021-12-26 MED ORDER — TOPIRAMATE 100 MG PO TABS
150.0000 mg | ORAL_TABLET | Freq: Every day | ORAL | Status: DC
  Administered 2021-12-27 – 2021-12-29 (×3): 150 mg via ORAL
  Filled 2021-12-26 (×3): qty 1.5

## 2021-12-26 MED ORDER — ACETAMINOPHEN 650 MG RE SUPP: 650.0000 mg | RECTAL | Status: DC | PRN

## 2021-12-26 MED ORDER — ACETAMINOPHEN 325 MG PO TABS
650.0000 mg | ORAL_TABLET | ORAL | Status: DC | PRN
  Administered 2021-12-27 – 2021-12-28 (×2): 650 mg via ORAL
  Filled 2021-12-26 (×2): qty 2

## 2021-12-26 MED ORDER — ROSUVASTATIN CALCIUM 20 MG PO TABS
20.0000 mg | ORAL_TABLET | Freq: Every day | ORAL | Status: DC
  Administered 2021-12-27 – 2021-12-29 (×3): 20 mg via ORAL
  Filled 2021-12-26 (×3): qty 1

## 2021-12-26 MED ORDER — SENNOSIDES-DOCUSATE SODIUM 8.6-50 MG PO TABS: 1.0000 | ORAL_TABLET | Freq: Every evening | ORAL | Status: DC | PRN

## 2021-12-26 MED ORDER — ASPIRIN 81 MG PO TBEC
81.0000 mg | DELAYED_RELEASE_TABLET | Freq: Every day | ORAL | Status: DC
  Administered 2021-12-26 – 2021-12-27 (×2): 81 mg via ORAL
  Filled 2021-12-26 (×2): qty 1

## 2021-12-26 MED ORDER — DILTIAZEM HCL ER COATED BEADS 120 MG PO CP24: 120.0000 mg | ORAL_CAPSULE | Freq: Every day | ORAL | Status: DC

## 2021-12-26 MED ORDER — ACETAMINOPHEN 160 MG/5ML PO SOLN
650.0000 mg | ORAL | Status: DC | PRN
Start: 2021-12-26 — End: 2021-12-29
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
## 2021-12-26 NOTE — Telephone Encounter (Signed)
Son calling about patient - stated [place] had a tick bite on 12/19/20- took tick out on Tuesday - Location: Right arm.   Symptoms: Weakness  No fever  Red spot  No bump on spot   Patient has been triaged for further evaluation.

## 2021-12-26 NOTE — ED Triage Notes (Signed)
On Eliquis. Here for right sided weakness since 3:45AM upon waking up. Complains of right arm and right leg weakness. NIHS is 0 upon arrival to ED. Denies hx of stroke.
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
## 2021-12-26 NOTE — ED Provider Notes (Signed)
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 significant sign of anemia.  EKG was not changed from prior, patient does not have chest pain, and troponin mildly elevated at 26 and have low suspicion for cardiac etiology of symptoms.  CT head was completed and personally evaluated and interpreted by me and showed no evidence of acute intracranial hemorrhage or abnormalities  MRI brain was ordered given concern for focal weakness on exam and shows a small acute infarct of the left thalamic cellular region, and additional more subacute appearing left small cerebellar infarct.  Discussed findings with patient and his son at bedside.  Consulted neurology.-Will admit to the hospitalist for further care.        Final Clinical Impression(s) / ED Diagnoses Final diagnoses:  Cerebrovascular accident (CVA), unspecified mechanism [facility])    Rx / DC Orders ED Discharge Orders      None         Gareth Morgan, MD 12/28/21 [phone]

## 2021-12-27 ENCOUNTER — Observation Stay (HOSPITAL_COMMUNITY): Payer: Medicare Other

## 2021-12-27 DIAGNOSIS — Z87891 Personal history of nicotine dependence: Secondary | ICD-10-CM | POA: Diagnosis not present

## 2021-12-27 DIAGNOSIS — D539 Nutritional anemia, unspecified: Secondary | ICD-10-CM | POA: Diagnosis present

## 2021-12-27 DIAGNOSIS — F419 Anxiety disorder, unspecified: Secondary | ICD-10-CM

## 2021-12-27 DIAGNOSIS — D631 Anemia in chronic kidney disease: Secondary | ICD-10-CM | POA: Diagnosis present

## 2021-12-27 DIAGNOSIS — I639 Cerebral infarction, unspecified: Secondary | ICD-10-CM

## 2021-12-27 DIAGNOSIS — I739 Peripheral vascular disease, unspecified: Secondary | ICD-10-CM | POA: Diagnosis present

## 2021-12-27 DIAGNOSIS — N401 Enlarged prostate with lower urinary tract symptoms: Secondary | ICD-10-CM | POA: Diagnosis present

## 2021-12-27 DIAGNOSIS — Z8043 Family history of malignant neoplasm of testis: Secondary | ICD-10-CM | POA: Diagnosis not present

## 2021-12-27 DIAGNOSIS — I69398 Other sequelae of cerebral infarction: Secondary | ICD-10-CM | POA: Diagnosis present

## 2021-12-27 DIAGNOSIS — M1712 Unilateral primary osteoarthritis, left knee: Secondary | ICD-10-CM | POA: Diagnosis present

## 2021-12-27 DIAGNOSIS — I480 Paroxysmal atrial fibrillation: Secondary | ICD-10-CM | POA: Diagnosis present

## 2021-12-27 DIAGNOSIS — Z85828 Personal history of other malignant neoplasm of skin: Secondary | ICD-10-CM | POA: Diagnosis not present

## 2021-12-27 DIAGNOSIS — N184 Chronic kidney disease, stage 4 (severe): Secondary | ICD-10-CM | POA: Diagnosis present

## 2021-12-27 DIAGNOSIS — Z7902 Long term (current) use of antithrombotics/antiplatelets: Secondary | ICD-10-CM | POA: Diagnosis not present

## 2021-12-27 DIAGNOSIS — I482 Chronic atrial fibrillation, unspecified: Secondary | ICD-10-CM | POA: Diagnosis not present

## 2021-12-27 DIAGNOSIS — I130 Hypertensive heart and chronic kidney disease with heart failure and stage 1 through stage 4 chronic kidney disease, or unspecified chronic kidney disease: Secondary | ICD-10-CM | POA: Diagnosis present

## 2021-12-27 DIAGNOSIS — I63542 Cerebral infarction due to unspecified occlusion or stenosis of left cerebellar artery: Secondary | ICD-10-CM | POA: Diagnosis present

## 2021-12-27 DIAGNOSIS — Z7901 Long term (current) use of anticoagulants: Secondary | ICD-10-CM | POA: Diagnosis not present

## 2021-12-27 DIAGNOSIS — Z8041 Family history of malignant neoplasm of ovary: Secondary | ICD-10-CM | POA: Diagnosis not present

## 2021-12-27 DIAGNOSIS — I350 Nonrheumatic aortic (valve) stenosis: Secondary | ICD-10-CM | POA: Diagnosis present

## 2021-12-27 DIAGNOSIS — R351 Nocturia: Secondary | ICD-10-CM | POA: Diagnosis present

## 2021-12-27 DIAGNOSIS — I6381 Other cerebral infarction due to occlusion or stenosis of small artery: Secondary | ICD-10-CM | POA: Diagnosis not present

## 2021-12-27 DIAGNOSIS — R270 Ataxia, unspecified: Secondary | ICD-10-CM | POA: Diagnosis present

## 2021-12-27 DIAGNOSIS — I25119 Atherosclerotic heart disease of native coronary artery with unspecified angina pectoris: Secondary | ICD-10-CM | POA: Diagnosis not present

## 2021-12-27 DIAGNOSIS — D649 Anemia, unspecified: Secondary | ICD-10-CM | POA: Diagnosis not present

## 2021-12-27 DIAGNOSIS — Z8546 Personal history of malignant neoplasm of prostate: Secondary | ICD-10-CM | POA: Diagnosis not present

## 2021-12-27 DIAGNOSIS — G8191 Hemiplegia, unspecified affecting right dominant side: Secondary | ICD-10-CM | POA: Diagnosis present

## 2021-12-27 DIAGNOSIS — M25562 Pain in left knee: Secondary | ICD-10-CM | POA: Diagnosis not present

## 2021-12-27 DIAGNOSIS — Z7982 Long term (current) use of aspirin: Secondary | ICD-10-CM | POA: Diagnosis not present

## 2021-12-27 DIAGNOSIS — I69351 Hemiplegia and hemiparesis following cerebral infarction affecting right dominant side: Secondary | ICD-10-CM | POA: Diagnosis not present

## 2021-12-27 DIAGNOSIS — Z79899 Other long term (current) drug therapy: Secondary | ICD-10-CM | POA: Diagnosis not present

## 2021-12-27 DIAGNOSIS — M11262 Other chondrocalcinosis, left knee: Secondary | ICD-10-CM | POA: Diagnosis not present

## 2021-12-27 DIAGNOSIS — G43909 Migraine, unspecified, not intractable, without status migrainosus: Secondary | ICD-10-CM | POA: Diagnosis present

## 2021-12-27 DIAGNOSIS — I252 Old myocardial infarction: Secondary | ICD-10-CM | POA: Diagnosis not present

## 2021-12-27 DIAGNOSIS — I2510 Atherosclerotic heart disease of native coronary artery without angina pectoris: Secondary | ICD-10-CM | POA: Diagnosis present

## 2021-12-27 DIAGNOSIS — I509 Heart failure, unspecified: Secondary | ICD-10-CM | POA: Diagnosis not present

## 2021-12-27 DIAGNOSIS — Z952 Presence of prosthetic heart valve: Secondary | ICD-10-CM | POA: Diagnosis not present

## 2021-12-27 DIAGNOSIS — I6389 Other cerebral infarction: Secondary | ICD-10-CM | POA: Diagnosis not present

## 2021-12-27 DIAGNOSIS — R55 Syncope and collapse: Secondary | ICD-10-CM | POA: Diagnosis present

## 2021-12-27 DIAGNOSIS — I10 Essential (primary) hypertension: Secondary | ICD-10-CM | POA: Diagnosis not present

## 2021-12-27 DIAGNOSIS — E785 Hyperlipidemia, unspecified: Secondary | ICD-10-CM | POA: Diagnosis present

## 2021-12-27 DIAGNOSIS — N400 Enlarged prostate without lower urinary tract symptoms: Secondary | ICD-10-CM | POA: Diagnosis present

## 2021-12-27 DIAGNOSIS — G8929 Other chronic pain: Secondary | ICD-10-CM | POA: Diagnosis not present

## 2021-12-27 DIAGNOSIS — I5032 Chronic diastolic (congestive) heart failure: Secondary | ICD-10-CM | POA: Diagnosis present

## 2021-12-27 DIAGNOSIS — R4781 Slurred speech: Secondary | ICD-10-CM | POA: Diagnosis present

## 2021-12-27 LAB — ECHOCARDIOGRAM COMPLETE
AR max vel: 1.66 cm2
AV Area VTI: 1.67 cm2
AV Area mean vel: 1.62 cm2
AV Mean grad: 10 mmHg
AV Peak grad: 18.1 mmHg
Ao pk vel: 2.13 m/s
Area-P 1/2: 4.68 cm2
S' Lateral: 3.5 cm

## 2021-12-27 LAB — LIPID PANEL
Cholesterol: 132 mg/dL (ref 0–200)
HDL: 39 mg/dL — ABNORMAL LOW (ref >40)
LDL Cholesterol: 80 mg/dL (ref 0–99)
Total CHOL/HDL Ratio: 3.4 RATIO
Triglycerides: 63 mg/dL (ref <150)
VLDL: 13 mg/dL (ref 0–40)

## 2021-12-27 LAB — HEMOGLOBIN A1C
Hgb A1c MFr Bld: 5.8 % — ABNORMAL HIGH (ref 4.8–5.6)
Mean Plasma Glucose: 119.76 mg/dL

## 2021-12-27 MED ORDER — LABETALOL HCL 5 MG/ML IV SOLN
10.0000 mg | INTRAVENOUS | Status: DC | PRN
Start: 2021-12-27 — End: 2021-12-29

## 2021-12-27 MED ORDER — APIXABAN 2.5 MG PO TABS
2.5000 mg | ORAL_TABLET | Freq: Two times a day (BID) | ORAL | Status: DC
  Administered 2021-12-27 – 2021-12-29 (×5): 2.5 mg via ORAL
  Filled 2021-12-27 (×5): qty 1

## 2021-12-27 MED ORDER — ISOSORBIDE MONONITRATE ER 60 MG PO TB24
60.0000 mg | ORAL_TABLET | Freq: Every day | ORAL | Status: DC
  Administered 2021-12-27 – 2021-12-29 (×3): 60 mg via ORAL
  Filled 2021-12-27 (×2): qty 1
  Filled 2021-12-27: qty 2

## 2021-12-27 NOTE — Progress Notes (Signed)
VASCULAR LAB    Carotid duplex has been performed.  See CV proc for preliminary results.   Dayshawn Irizarry, RVT 12/27/2021, 9:02 AM

## 2021-12-27 NOTE — Assessment & Plan Note (Signed)
Due to chronic kidney disease.  Hemoglobin stable relative to baseline

## 2021-12-27 NOTE — ED Notes (Signed)
Called into patients room by Thurmond Butts, PT for syncopal episode. When this nurse entered the room patient was lying in bed alert & oriented and reported he felt weak. Pt was noted to be pale and diaphoretic. Per Thurmond Butts the patient had LOC while sitting on the commode and patient did not fall. Pt vitals remained WNL post syncopal episode.

## 2021-12-27 NOTE — Evaluation (Signed)
Physical Therapy Evaluation Patient Details Name: Mark Jackson. MRN: [id number] DOB: 1930/02/13 Today's Date: 12/27/2021  History of Present Illness  88 y.o. male presents to [facility] hospital on 12/26/2021 with R weakness and a fall. MRI of the brain was done that showed a left internal capsule/thalamic lacunar looking infarct. PMH includes PAF, prostate CA, CAD, chronic LBP, HLD.  Clinical Impression  Pt presents to PT with deficits in functional mobility, strength, power, endurance, gait. Pt with R weakness and instability when mobilizing, requiring PT physical assistance to prevent 2 potential falls. During evaluation pt with an episode of reduced responsiveness while on the commode, diaphoretic, with increased weakness. PT assisted back to bed where BP was found to be 114/67, pre-mobility BP was systolics of 235T. Pt lethargic after incident but mentation begins to improve once in supine. PT recommends AIR admission at this time to aide in restoring independence in mobility.     Recommendations for follow up therapy are one component of a multi-disciplinary discharge planning process, led by the attending physician.  Recommendations may be updated based on patient status, additional functional criteria and insurance authorization.  Follow Up Recommendations Acute inpatient rehab (3hours/day) (may require HHPT as pt is obs status)    Assistance Recommended at Discharge Frequent or constant Supervision/Assistance  Patient can return home with the following  A lot of help with walking and/or transfers;A lot of help with bathing/dressing/bathroom;Assistance with cooking/housework;Assist for transportation;Help with stairs or ramp for entrance    Equipment Recommendations Rolling walker (2 wheels);BSC/3in1  Recommendations for Other Services  Rehab consult    Functional Status Assessment Patient has had a recent decline in their functional status and demonstrates the ability to make  significant improvements in function in a reasonable and predictable amount of time.     Precautions / Restrictions Precautions Precautions: Fall Restrictions Weight Bearing Restrictions: No      Mobility  Bed Mobility Overal bed mobility: Needs Assistance Bed Mobility: Supine to Sit     Supine to sit: Min assist, HOB elevated          Transfers Overall transfer level: Needs assistance Equipment used: Rolling walker (2 wheels), 1 person hand held assist Transfers: Sit to/from Stand, Bed to chair/wheelchair/BSC Sit to Stand: Min assist   Step pivot transfers: Mod assist       General transfer comment: modA step-pivot without walker    Ambulation/Gait Ambulation/Gait assistance: Mod assist Gait Distance (Feet): 20 Feet Assistive device: Rolling walker (2 wheels) Gait Pattern/deviations: Step-to pattern, Decreased step length - right, Decreased dorsiflexion - right Gait velocity: reduced Gait velocity interpretation: <1.31 ft/sec, indicative of household ambulator   General Gait Details: pt with slowed step-to gait, R foot drag noted. 2 posterior losses of balance requiring modA to correct  Stairs            Wheelchair Mobility    Modified Rankin (Stroke Patients Only) Modified Rankin (Stroke Patients Only) Pre-Morbid Rankin Score: No symptoms Modified Rankin: Moderately severe disability     Balance Overall balance assessment: Needs assistance Sitting-balance support: No upper extremity supported, Feet supported Sitting balance-Leahy Scale: Fair     Standing balance support: Bilateral upper extremity supported, Reliant on assistive device for balance Standing balance-Leahy Scale: Poor                               Pertinent Vitals/Pain Pain Assessment Pain Assessment: Faces Faces Pain Scale: Hurts little more  Pain Location: L knee Pain Descriptors / Indicators: Grimacing Pain Intervention(s): Monitored during session    Home  Living Family/patient expects to be discharged to:: Private residence Living Arrangements: Alone Available Help at Discharge: Family;Available PRN/intermittently (24/7 may be available next week, family out of town currently) Type of Home: House Home Access: Ramped entrance       [place]: Two level;Able to live on main level with bedroom/bathroom Home Equipment: Rollator (4 wheels);Cane - quad;Cane - single point;Shower seat;Wheelchair - manual      Prior Function Prior Level of Function : Independent/Modified Independent;Driving;Working/employed             Mobility Comments: ambulates with use of cane       Hand Dominance   Dominant Hand: Right    Extremity/Trunk Assessment   Upper Extremity Assessment Upper Extremity Assessment: RUE deficits/detail RUE Deficits / Details: grossly 4/5 RUE    Lower Extremity Assessment Lower Extremity Assessment: RLE deficits/detail RLE Deficits / Details: grossly 4/5 RLE    Cervical / Trunk Assessment Cervical / Trunk Assessment: Kyphotic  Communication   Communication: No difficulties  Cognition Arousal/Alertness: Awake/alert Behavior During Therapy: WFL for tasks assessed/performed Overall Cognitive Status: Within Functional Limits for tasks assessed                                          General Comments General comments (skin integrity, edema, etc.): VSS on RA    Exercises     Assessment/Plan    PT Assessment Patient needs continued PT services  PT Problem List Decreased strength;Decreased activity tolerance;Decreased balance;Decreased mobility;Decreased knowledge of use of DME;Pain;Decreased knowledge of precautions       PT Treatment Interventions DME instruction;Gait training;Functional mobility training;Therapeutic activities;Therapeutic exercise;Balance training;Neuromuscular re-education;Patient/family education    PT Goals (Current goals can be found in the Care Plan section)  Acute  Rehab PT Goals Patient Stated Goal: to return to independence PT Goal Formulation: With patient Time For Goal Achievement: 01/10/22 Potential to Achieve Goals: Good    Frequency Min 4X/week     Co-evaluation               AM-PAC PT "6 Clicks" Mobility  Outcome Measure Help needed turning from your back to your side while in a flat bed without using bedrails?: A Little Help needed moving from lying on your back to sitting on the side of a flat bed without using bedrails?: A Little Help needed moving to and from a bed to a chair (including a wheelchair)?: A Little Help needed standing up from a chair using your arms (e.g., wheelchair or bedside chair)?: A Little Help needed to walk in hospital room?: A Lot Help needed climbing 3-5 steps with a railing? : Total 6 Click Score: 15    End of Session   Activity Tolerance: Patient tolerated treatment well Patient left: in bed;with call bell/phone within reach Nurse Communication: Mobility status PT Visit Diagnosis: Other abnormalities of gait and mobility (R26.89);Muscle weakness (generalized) (M62.81);Hemiplegia and hemiparesis Hemiplegia - Right/Left: Right Hemiplegia - dominant/non-dominant: Dominant Hemiplegia - caused by: Cerebral infarction    Time: 1241-1335 PT Time Calculation (min) (ACUTE ONLY): 54 min   Charges:   PT Evaluation $PT Eval Low Complexity: 1 Low PT Treatments $Gait Training: 23-37 mins $Therapeutic Activity: 8-22 mins        Zenaida Niece, PT, DPT Acute Rehabilitation Pager: [phone] Office  [place] Brix Brearley 12/27/2021, 2:27 PM

## 2021-12-27 NOTE — Progress Notes (Signed)
  Echocardiogram 2D Echocardiogram has been performed.  Mark Jackson 12/27/2021, 8:53 AM

## 2021-12-27 NOTE — Assessment & Plan Note (Signed)
Obesity ruled out.  BMI 27

## 2021-12-27 NOTE — Progress Notes (Signed)
Inpatient Rehab Admissions Coordinator Note:   Per PT patient was screened for CIR candidacy by Lugene Hitt Danford Bad, CCC-SLP. At this time, pt appears to be a potential candidate for CIR. I will place an order for rehab consult for full assessment, per our protocol.  Please contact me any with questions.Gayland Curry, [place], [place] Admissions Coordinator [phone] 12/27/21 3:58 PM

## 2021-12-27 NOTE — Assessment & Plan Note (Signed)
MRI brain shows small left thalamic infarct and small left cerebellar infarct, possibly cardioembolic source in the setting of A-fib. - Resume Eliquis Noninvasive angiography showed no significant intracranial atherosclerosis, ReSound did show some advanced carotid disease on the right, none on the left.  Echocardiogram showed normal EF, no cardiogenic source LDL obtained, 80 - Continue Crestor

## 2021-12-27 NOTE — Assessment & Plan Note (Signed)
Not on aspirin or Plavix due to Eliquis - Continue Eliquis, Crestor - Hold diltiazem given permissive hypertension - Continue Imdur

## 2021-12-27 NOTE — Assessment & Plan Note (Signed)
Creatinine stable relative to baseline 

## 2021-12-27 NOTE — ED Notes (Signed)
Patient states his legs will not hold him c/o weakness from chronic knee pain

## 2021-12-27 NOTE — Progress Notes (Signed)
OT Cancellation Note  Patient Details Name: Mark Jackson. MRN: [id number] DOB: 07/10/1930   Cancelled Treatment:    Reason Eval/Treat Not Completed: Medical issues which prohibited therapy. Pt just had syncopal episode with PT. Patient being fully admitted to hospital. OT will hold for evaluation.   Merri Ray Charlcie Prisco 12/27/2021, 2:15 PM  Jesse Sans OTR/L Acute Rehabilitation Services Pager: [phone] Office: [phone]

## 2021-12-27 NOTE — Progress Notes (Signed)
  Progress Note   Patient: Mark Jackson. RCV:[id number] DOB: 01-Oct-1929 DOA: 12/26/2021     0 DOS: the patient was seen and examined on 12/27/2021 at 0930      Brief hospital course: Mr. Bignell is an 88 y.o. M with pAF on ELiquis, hx TAVR, CAD s/p PCI >1 year, HTN, obesity, PVD, CKD IV, dCHF, BPH, prostate cancer who presented with R weakness.       Assessment and Plan: * Acute CVA (cerebrovascular accident) ([place]) MRI brain shows small left thalamic infarct and small left cerebellar infarct, possibly cardioembolic source in the setting of A-fib. - Resume Eliquis Noninvasive angiography showed no significant intracranial atherosclerosis, ReSound did show some advanced carotid disease on the right, none on the left.  Echocardiogram showed normal EF, no cardiogenic source LDL obtained, 80 - Continue Crestor     Chronic diastolic heart failure (HCC) Hold diltiazem and furosemide - Continue Imdur  Syncope and collapse Chronic and recurrent problem. Had one syncope here while sitting on the commode. - Hold furoemide and diltiazem  BPH associated with nocturia    CAD S/P DES PCI to proximal LAD Not on aspirin or Plavix due to Eliquis - Continue Eliquis, Crestor - Hold diltiazem given permissive hypertension - Continue Imdur  Chronic kidney disease (CKD), active medical management without dialysis, stage 4 (severe) (HCC) Creatinine stable relative to baseline  Paroxysmal atrial fibrillation ([place]); CHA2DSVasc - 4; Now on Eliquis - Continue Eliquis  Hyperlipidemia with target LDL less than 70 Obesity ruled out.  BMI 27  Essential hypertension Allow some permissive hypertension - Hold diltiazem, furosemide - Continue Imdur  Anemia Due to chronic kidney disease.  Hemoglobin stable relative to baseline          Subjective: Patient feeling well, has bilateral lower extremity weakness, eager to go home.  No focal speech deficits, confusion, fever, chest  pain.     Physical Exam: Vitals:   12/27/21 0730 12/27/21 1036 12/27/21 1100 12/27/21 1430  BP: (!) 162/82 (!) 201/95 (!) 181/81 127/70  Pulse: 77 75 71 69  Resp: '17 18 15 11  '$ Temp:      TempSrc:      SpO2: 95% 97% 98% 98%   Elderly adult male, lying in bed, no acute distress RRR, no murmurs, no peripheral edema, normal respiratory rate and rhythm, lungs clear without rales or wheezes Attention normal, affect normal, judgment Syprine normal  Data Reviewed: Discussed with neurology team, nursing notes reviewed, vital signs reviewed LDL reviewed, A1c reviewed MR angiogram reviewed MRI reviewed  Family Communication: Son at the bedside    Disposition: Status is: Inpatient Patient was admitted with embolic stroke, NIHSS 4, severe gait instability.  He was evaluated with physical therapy and will need placement        Author: Edwin Dada, MD 12/27/2021 3:27 PM  For on call review [email or web address].

## 2021-12-27 NOTE — Progress Notes (Addendum)
STROKE TEAM PROGRESS NOTE   INTERVAL HISTORY His daughter is at the bedside with her husband.  Patient is compliant with eliquis, agreeable to resume. Strength has improved. Awaiting PT/OT evaluation.   Vitals:   12/27/21 0430 12/27/21 0600 12/27/21 0700 12/27/21 0730  BP: (!) 154/72 (!) 167/70 (!) 163/83 (!) 162/82  Pulse: 71 73 77 77  Resp: 16   17  Temp: (!) 97.5 F (36.4 C)     TempSrc: Oral     SpO2: 98% 93% 96% 95%   CBC:  Recent Labs  Lab 12/26/21 1902  WBC 11.4*  NEUTROABS 8.6*  HGB 11.8*  HCT 37.0*  MCV 104.2*  PLT 811*   Basic Metabolic Panel:  Recent Labs  Lab 12/26/21 1902  NA 141  K 4.3  CL 115*  CO2 18*  GLUCOSE 99  BUN 61*  CREATININE 2.88*  CALCIUM 8.6*   Lipid Panel:  Recent Labs  Lab 12/27/21 0245  CHOL 132  TRIG 63  HDL 39*  CHOLHDL 3.4  VLDL 13  LDLCALC 80   HgbA1c:  Recent Labs  Lab 12/27/21 0245  HGBA1C 5.8*   Urine Drug Screen: No results for input(s): LABOPIA, COCAINSCRNUR, LABBENZ, AMPHETMU, THCU, LABBARB in the last 168 hours.  Alcohol Level No results for input(s): ETH in the last 168 hours.  IMAGING past 24 hours DG Knee 1-2 Views Left  Result Date: 12/27/2021 CLINICAL DATA:  Chronic pain, previous fall EXAM: LEFT KNEE - 1-2 VIEW COMPARISON:  11/27/2019 FINDINGS: No fracture or dislocation is seen. Degenerative changes are noted with chondrocalcinosis and bony spurs. There is no significant effusion. Scattered arterial calcifications are seen in the soft tissues. IMPRESSION: No fracture or dislocation is seen in the left knee. Degenerative changes are noted with chondrocalcinosis and bony spurs. Electronically Signed   By: Elmer Picker M.D.   On: 12/27/2021 10:20   CT Head Wo Contrast  Result Date: 12/26/2021 CLINICAL DATA:  Neuro deficit stroke suspected EXAM: CT HEAD WITHOUT CONTRAST TECHNIQUE: Contiguous axial images were obtained from the base of the skull through the vertex without intravenous contrast.  RADIATION DOSE REDUCTION: This exam was performed according to the departmental dose-optimization program which includes automated exposure control, adjustment of the mA and/or kV according to patient size and/or use of iterative reconstruction technique. COMPARISON:  04/07/2020 FINDINGS: Brain: No evidence of acute infarction, hemorrhage, cerebral edema, mass, mass effect, or midline shift. No hydrocephalus or acute extra-axial fluid collection. Redemonstrated left middle cranial fossa arachnoid cyst, unchanged. Vascular: No hyperdense vessel. Skull: Normal. Negative for fracture or focal lesion. Sinuses/Orbits: No acute finding. Status post bilateral lens replacements. Other: The mastoid air cells are well aerated. IMPRESSION: No acute intracranial process. Electronically Signed   By: Merilyn Baba M.D.   On: 12/26/2021 19:40   MR ANGIO HEAD WO CONTRAST  Result Date: 12/27/2021 CLINICAL DATA:  Stroke follow-up EXAM: MRA HEAD WITHOUT CONTRAST TECHNIQUE: Angiographic images of the Circle of Willis were acquired using MRA technique without intravenous contrast. COMPARISON:  None Available. FINDINGS: POSTERIOR CIRCULATION: --Vertebral arteries: Normal --Inferior cerebellar arteries: Normal. --Basilar artery: Normal. --Superior cerebellar arteries: Normal. --Posterior cerebral arteries: Normal. ANTERIOR CIRCULATION: --Intracranial internal carotid arteries: Normal. --Anterior cerebral arteries (ACA): Normal. --Middle cerebral arteries (MCA): Normal. ANATOMIC VARIANTS: None Incidental left middle cranial fossa arachnoid cyst. IMPRESSION: Normal intracranial MRA. Electronically Signed   By: Ulyses Jarred M.D.   On: 12/27/2021 01:02   MR BRAIN WO CONTRAST  Result Date: 12/26/2021 CLINICAL DATA:  Stroke,  follow up, right arm and leg weakness EXAM: MRI HEAD WITHOUT CONTRAST TECHNIQUE: Multiplanar, multiecho pulse sequences of the brain and surrounding structures were obtained without intravenous contrast.  COMPARISON:  July 2022 FINDINGS: Brain: Small focus of reduced diffusion in the left thalamocapsular region. Additional punctate focus diffusion hyperintensity in the left cerebellum. Left middle cranial fossa arachnoid cyst is again noted. Prominence of the ventricles and sulci reflects similar parenchymal volume loss. Patchy and confluent areas of T2 hyperintensity in the supratentorial white matter are nonspecific but may reflect stable chronic microvascular ischemic changes. Small chronic bilateral cerebellar, left occipital, and left parietal infarcts. No intracranial mass or mass effect. No extra-axial collection. No hydrocephalus. Vascular: Major vessel flow voids at the skull base are preserved. Skull and upper cervical spine: Normal marrow signal is preserved. Sinuses/Orbits: Paranasal sinuses are aerated. Orbits are unremarkable. Other: Sella is unremarkable.  Mastoid air cells are clear. IMPRESSION: Small acute infarct of the left thalamocapsular region. Additional more subacute appearing small left cerebellar infarct. Chronic infarcts and chronic microvascular ischemic changes as detailed above. Electronically Signed   By: Macy Mis M.D.   On: 12/26/2021 20:45   DG Chest Portable 1 View  Result Date: 12/26/2021 CLINICAL DATA:  Altered mental status EXAM: PORTABLE CHEST 1 VIEW COMPARISON:  12/17/2020 FINDINGS: Lungs are clear. No pleural effusion or pneumothorax. Similar cardiomediastinal contours allowing for differences in technique. Post TAVR. IMPRESSION: No acute process in the chest. Electronically Signed   By: Macy Mis M.D.   On: 12/26/2021 21:21   VAS US CAROTID  Result Date: 12/27/2021 Carotid Arterial Duplex Study Patient Name:  Mark Jackson.  Date of Exam:   12/27/2021 Medical Rec #: [id number]           Accession #:    [id number] Date of Birth: 01-Apr-1930          Patient Gender: M Patient Age:   88 years Exam Location:  [facility] Procedure:      VAS US CAROTID  Referring Phys: Amie Portland --------------------------------------------------------------------------------  Indications:       CVA, Carotid artery disease and Weakness. Risk Factors:      Hypertension, hyperlipidemia, coronary artery disease. Other Factors:     Atrial fibrillation, CHF, CKD IV, TAVR. Comparison Study:  Prior study done 10/30/19 indicating 60-79% right ICA stenosis                    and 1-39% left ICA stenosis. Performing Technologist: Sharion Dove RVS  Examination Guidelines: A complete evaluation includes B-mode imaging, spectral Doppler, color Doppler, and power Doppler as needed of all accessible portions of each vessel. Bilateral testing is considered an integral part of a complete examination. Limited examinations for reoccurring indications may be performed as noted.  Right Carotid Findings: +----------+--------+--------+--------+----------------------+--------+           PSV cm/sEDV cm/sStenosisPlaque Description    Comments +----------+--------+--------+--------+----------------------+--------+ CCA Prox  77      18              homogeneous                    +----------+--------+--------+--------+----------------------+--------+ CCA Distal69      22              homogeneous                    +----------+--------+--------+--------+----------------------+--------+ ICA Prox  318     69  60-79%  irregular and calcific         +----------+--------+--------+--------+----------------------+--------+ ICA Mid   263     58                                             +----------+--------+--------+--------+----------------------+--------+ ICA Distal53      16                                             +----------+--------+--------+--------+----------------------+--------+ ECA       160     21                                             +----------+--------+--------+--------+----------------------+--------+  +----------+--------+-------+--------+-------------------+           PSV cm/sEDV cmsDescribeArm Pressure (mmHG) +----------+--------+-------+--------+-------------------+ [id number]                                         +----------+--------+-------+--------+-------------------+ +---------+--------+--+--------+--+ VertebralPSV cm/s48EDV cm/s13 +---------+--------+--+--------+--+  Left Carotid Findings: +----------+--------+--------+--------+------------------+--------+           PSV cm/sEDV cm/sStenosisPlaque DescriptionComments +----------+--------+--------+--------+------------------+--------+ CCA Prox  118     25                                         +----------+--------+--------+--------+------------------+--------+ CCA Distal99      25                                         +----------+--------+--------+--------+------------------+--------+ ICA Prox  107     34      1-39%                              +----------+--------+--------+--------+------------------+--------+ ICA Distal66      19                                         +----------+--------+--------+--------+------------------+--------+ ECA       89      21                                         +----------+--------+--------+--------+------------------+--------+ +----------+--------+--------+--------+-------------------+           PSV cm/sEDV cm/sDescribeArm Pressure (mmHG) +----------+--------+--------+--------+-------------------+ [id number]                                         +----------+--------+--------+--------+-------------------+ +---------+--------+--+--------+--+ VertebralPSV cm/s83EDV cm/s24 +---------+--------+--+--------+--+   Summary: Right Carotid: Velocities in the right ICA are consistent with a 60-79%  stenosis. Velocities have not shown a significant increase since                prior study done 10/30/19. Left Carotid: Velocities  in the left ICA are consistent with a 1-39% stenosis. Vertebrals:  Bilateral vertebral arteries demonstrate antegrade flow. Subclavians: Normal flow hemodynamics were seen in bilateral subclavian              arteries. *See table(s) above for measurements and observations.     Preliminary     PHYSICAL EXAM  Physical Exam  Constitutional: Appears well-developed and well-nourished.  Cardiovascular: Normal rate and regular rhythm.  Respiratory: Effort normal, non-labored breathing  Neurological exam Awake alert oriented x3 Speech is mildly dysarthric No evidence of aphasia Normal attention concentration Cranial nerves: Pupils equal round react light, extraocular movements intact, visual fields appear full, face in general appears symmetric but addressed there is a very subtle right angle of the mild drooling-family cannot tell me whether that is normal or not and patient has not noticed any drooling of food from that side, orotracheally diminished bilaterally, tongue and palate midline. Motor examination with no drift in bilateral upper extremities.  Strength seems symmetric.  Bilateral lower extremities are weak with right lower extremity being weaker than the left.  Right lower extremity drifts whereas left lower extremity does not drift. Sensation intact to touch without extinction Coordination with dysmetria disproportionate to weakness in the right upper extremity. Gait testing deferred   ASSESSMENT/PLAN Mr. Ah Bott. is a 88 y.o. male with history of CAD status post PCI, hypertension, obesity, carotid artery disease, TAVR, hyperlipidemia, atrial fibrillation, CKD 4, diastolic CHF, status post TAVR, anemia, glaucoma, BPH, prostate cancer, migraine, neuropathy, insomnia presenting with right-sided weakness presenting with right side weakness after a fall.   Stroke:  Left thalamic infarct and small left cerebellar infarct likely secondary cardio embolic source in the setting of afib  on eliquis  Code Stroke CT head No acute abnormality. Small vessel disease. Atrophy. ASPECTS 10.    MRI  small infarct in the left thalamic capsular region and a more subacute appearing small left cerebellar infarct. MRA  Normal intracranial MRA Carotid Doppler  Velocities in the right ICA are consistent with a 60-79% stenosis. Velocities have not shown a significant increase since prior study done 10/30/19.  2D Echo EF 55-60& LDL 80 HgbA1c 5.8 VTE prophylaxis - Eliquis    Diet   Diet Heart Room service appropriate? Yes; Fluid consistency: Thin   Eliquis (apixaban) daily prior to admission, now on Eliquis (apixaban) daily.  Therapy recommendations:  CIR Disposition:  Pending  Atrial Fibrillation Home meds: Eliquis 2.'5mg'$ , cardizem CD '120mg'$    Hypertension Home meds:  Imdur '2mg'$  Stable Permissive hypertension (OK if < 220/120) but gradually normalize in 5-7 days Long-term BP goal normotensive  Hyperlipidemia Home meds:  Crestor '20mg'$ , resumed in hospital LDL 80, goal < 70 Continue statin at discharge  Other Stroke Risk Factors Advanced Age >/= 94  Coronary artery disease Migraines Follows with Dr. Tomi Likens at [facility] meds:Topamax Congestive heart failure S/p TAVR  Home meds: lasix  Other Active Problems CKD stage 4 Cr 2.88  Hospital day # 0  Patient seen and examined by NP/APP with MD. MD to update note as needed.   Janine Ores, DNP, FNP-BC [facility] Pager: [phone]  ATTENDING ATTESTATION:  88 year old gentleman with history of atrial fibrillation on Eliquis.  Initially held onto MRI completed.  MRI shows small left thalamic stroke.  MRA is negative.  Carotid Doppler shows left ICA partial occlusion that is stable.  He follows with vascular surgery outpatient but is not seen for 2 years.  Recommend they follow-up with vascular surgery outpatient for further recommendations.  Eliquis resumed today renally dosed at 2.5 mg twice daily.  Discussed his  work-up.  He will need rehab inpatient.  stroke risk factors and preventative measures discussed.  He is ready to go home and not happy to be here.  Family is in agreement with inpatient rehab.  Orthostatic syncope while on the commode during PT evaluation and blood pressure dropped to 114/67.  Recommend checking orthostatics.   Neurology will sign off.  Please call with questions.  Dr. Reeves Forth evaluated pt independently, reviewed imaging, chart, labs. Discussed and formulated plan with the APP. Please see APP note above for details.   Total 36 minutes spent on counseling patient and coordinating care, writing notes and reviewing chart.   Lowell Makara,MD    To contact Stroke Continuity provider, please refer to [email or web address] After hours, contact General Neurology

## 2021-12-27 NOTE — Assessment & Plan Note (Signed)
Hold diltiazem and furosemide - Continue Imdur

## 2021-12-27 NOTE — Assessment & Plan Note (Signed)
-   Continue Eliquis 

## 2021-12-27 NOTE — Assessment & Plan Note (Signed)
Chronic and recurrent problem. Had one syncope here while sitting on the commode. - Hold furoemide and diltiazem

## 2021-12-27 NOTE — Assessment & Plan Note (Signed)
Allow some permissive hypertension - Hold diltiazem, furosemide - Continue Imdur

## 2021-12-27 NOTE — Hospital Course (Signed)
Mark Jackson is an 88 y.o. M with pAF on ELiquis, hx TAVR, CAD s/p PCI >1 year, HTN, obesity, PVD, CKD IV, dCHF, BPH, prostate cancer who presented with R weakness.

## 2021-12-28 DIAGNOSIS — W57XXXA Bitten or stung by nonvenomous insect and other nonvenomous arthropods, initial encounter: Secondary | ICD-10-CM

## 2021-12-28 DIAGNOSIS — I639 Cerebral infarction, unspecified: Secondary | ICD-10-CM | POA: Diagnosis not present

## 2021-12-28 LAB — COMPREHENSIVE METABOLIC PANEL
ALT: 12 U/L (ref 0–44)
AST: 19 U/L (ref 15–41)
Albumin: 2.8 g/dL — ABNORMAL LOW (ref 3.5–5.0)
Alkaline Phosphatase: 47 U/L (ref 38–126)
Anion gap: 5 (ref 5–15)
BUN: 56 mg/dL — ABNORMAL HIGH (ref 8–23)
CO2: 20 mmol/L — ABNORMAL LOW (ref 22–32)
Calcium: 8.3 mg/dL — ABNORMAL LOW (ref 8.9–10.3)
Chloride: 116 mmol/L — ABNORMAL HIGH (ref 98–111)
Creatinine, Ser: 2.69 mg/dL — ABNORMAL HIGH (ref 0.61–1.24)
GFR, Estimated: 22 mL/min — ABNORMAL LOW (ref >60)
Glucose, Bld: 105 mg/dL — ABNORMAL HIGH (ref 70–99)
Potassium: 4.4 mmol/L (ref 3.5–5.1)
Sodium: 141 mmol/L (ref 135–145)
Total Bilirubin: 0.4 mg/dL (ref 0.3–1.2)
Total Protein: 5.6 g/dL — ABNORMAL LOW (ref 6.5–8.1)

## 2021-12-28 LAB — CBC
HCT: 30.8 % — ABNORMAL LOW (ref 39.0–52.0)
Hemoglobin: 9.9 g/dL — ABNORMAL LOW (ref 13.0–17.0)
MCH: 33.4 pg (ref 26.0–34.0)
MCHC: 32.1 g/dL (ref 30.0–36.0)
MCV: 104.1 fL — ABNORMAL HIGH (ref 80.0–100.0)
Platelets: 120 10*3/uL — ABNORMAL LOW (ref 150–400)
RBC: 2.96 MIL/uL — ABNORMAL LOW (ref 4.22–5.81)
RDW: 13.2 % (ref 11.5–15.5)
WBC: 10.3 10*3/uL (ref 4.0–10.5)
nRBC: 0 % (ref 0.0–0.2)
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
## 2021-12-28 NOTE — Evaluation (Signed)
Occupational Therapy Evaluation Patient Details Name: Mark Jackson. MRN: [id number] DOB: [birth year] Today's Date: 12/28/2021   History of Present Illness 88 y.o. male presents to [facility] hospital on 12/26/2021 with R weakness and a fall. MRI of the brain was done that showed a left internal capsule/thalamic lacunar looking infarct. PMH includes PAF, prostate CA, CAD, chronic LBP, HLD.   Clinical Impression   Pt reports independence at baseline with ADLs and functional mobility. Lives independently, however has children who live within 3 mins of his home and can provide assistance. Pt currently requiring min-mod A for ADLs, min A for bed mobility and step pivot transfers with RW. Pt BP WNL throughout session, requires min guard for sitting balance as pt fatigues quickly, and has difficulty reaching outside BOS. Pt presenting with impairments listed below, will follow acutely. Recommend AIR at d/c to return to PLOF and decrease caregiver burden.     Recommendations for follow up therapy are one component of a multi-disciplinary discharge planning process, led by the attending physician.  Recommendations may be updated based on patient status, additional functional criteria and insurance authorization.   Follow Up Recommendations  Acute inpatient rehab (3hours/day)    Assistance Recommended at Discharge Frequent or constant Supervision/Assistance  Patient can return home with the following A lot of help with walking and/or transfers;A lot of help with bathing/dressing/bathroom;Assistance with cooking/housework;Direct supervision/assist for medications management;Direct supervision/assist for financial management;Help with stairs or ramp for entrance;Assist for transportation    Functional Status Assessment  Patient has had a recent decline in their functional status and demonstrates the ability to make significant improvements in function in a reasonable and predictable amount of time.  Equipment  Recommendations  BSC/3in1    Recommendations for Other Services PT consult;Rehab consult     Precautions / Restrictions Precautions Precautions: Fall Restrictions Weight Bearing Restrictions: No      Mobility Bed Mobility Overal bed mobility: Needs Assistance Bed Mobility: Supine to Sit     Supine to sit: HOB elevated, Min assist          Transfers Overall transfer level: Needs assistance Equipment used: Rolling walker (2 wheels) Transfers: Sit to/from Stand Sit to Stand: Min assist     Step pivot transfers: Min assist     General transfer comment: cues needed for step sequencing      Balance Overall balance assessment: Needs assistance Sitting-balance support: No upper extremity supported, Feet supported Sitting balance-Leahy Scale: Fair Sitting balance - Comments: difficulty reaching outside BOS without LOB, posterior lean   Standing balance support: Bilateral upper extremity supported, Reliant on assistive device for balance Standing balance-Leahy Scale: Poor                             ADL either performed or assessed with clinical judgement   ADL Overall ADL's : Needs assistance/impaired Eating/Feeding: Set up;Sitting   Grooming: Set up;Sitting   Upper Body Bathing: Sitting;Minimal assistance   Lower Body Bathing: Moderate assistance;Supervison/ safety;Set up;Sitting/lateral leans   Upper Body Dressing : Sitting;Minimal assistance   Lower Body Dressing: Moderate assistance;Sitting/lateral leans Lower Body Dressing Details (indicate cue type and reason): able to don R sock without difficulty, needing assistance for L sock due to poor sitting balance Toilet Transfer: Minimal assistance;Rolling walker (2 wheels);Stand-pivot;BSC/3in1 Armed forces technical officer Details (indicate cue type and reason): simulated to chair Toileting- Clothing Manipulation and Hygiene: Min guard Toileting - Clothing Manipulation Details (indicate cue type and reason): to  use urinal at EOB due to poor sitting balance     Functional mobility during ADLs: Minimal assistance;Rolling walker (2 wheels)       Vision Baseline Vision/History: 1 Wears glasses Vision Assessment?: No apparent visual deficits     Perception     Praxis      Pertinent Vitals/Pain Pain Assessment Pain Assessment: Faces Pain Score: 2  Faces Pain Scale: Hurts a little bit Pain Location: L knee, headache Pain Descriptors / Indicators: Grimacing Pain Intervention(s): Limited activity within patient's tolerance, Monitored during session     Hand Dominance     Extremity/Trunk Assessment Upper Extremity Assessment Upper Extremity Assessment: Overall WFL for tasks assessed RUE Deficits / Details: grossly 4/5 RUE, sensation intact bilaterally, grip strength 5/5   Lower Extremity Assessment Lower Extremity Assessment: Defer to PT evaluation   Cervical / Trunk Assessment Cervical / Trunk Assessment: Kyphotic   Communication Communication Communication: No difficulties   Cognition Arousal/Alertness: Awake/alert Behavior During Therapy: WFL for tasks assessed/performed Overall Cognitive Status: Within Functional Limits for tasks assessed                                 General Comments: able to recall home information, follows single step instructions with increased time, at times requires repetition     General Comments  BP WNL, 148/82 sitting EOB, 141/81 after chair transfer    Exercises     Shoulder Instructions      Home Living Family/patient expects to be discharged to:: Private residence Living Arrangements: Alone Available Help at Discharge: Family (children live within 3 miles of pt) Type of Home: House Home Access: Ramped entrance     Home Layout: Two level;Able to live on main level with bedroom/bathroom     Bathroom Shower/Tub: Occupational psychologist: Standard     Home Equipment: Rollator (4 wheels);Cane - quad;Cane -  single point;Shower seat;Wheelchair - manual          Prior Functioning/Environment Prior Level of Function : Independent/Modified Independent;Driving;Working/employed             Mobility Comments: ambulates with use of cane ADLs Comments: works for [employer], does IADLs        OT Problem List: Decreased strength;Decreased range of motion;Decreased activity tolerance;Impaired balance (sitting and/or standing);Decreased coordination;Decreased safety awareness      OT Treatment/Interventions: Self-care/ADL training;Therapeutic exercise;Therapeutic activities;Visual/perceptual remediation/compensation;Patient/family education;Balance training;Energy conservation;DME and/or AE instruction;Neuromuscular education;Cognitive remediation/compensation    OT Goals(Current goals can be found in the care plan section) Acute Rehab OT Goals Patient Stated Goal: to get stronger OT Goal Formulation: With patient Time For Goal Achievement: 01/11/22 Potential to Achieve Goals: Good ADL Goals Pt Will Perform Upper Body Dressing: with supervision;sitting Pt Will Perform Lower Body Dressing: with supervision;sitting/lateral leans;sit to/from stand Pt Will Transfer to Toilet: stand pivot transfer;bedside commode;with supervision Pt Will Perform Tub/Shower Transfer: Shower transfer;Tub transfer;rolling walker;3 in 1;ambulating Additional ADL Goal #1: Pt will accurately follow 2 step instruction in prep for ADLs  OT Frequency: Min 3X/week    Co-evaluation              AM-PAC OT "6 Clicks" Daily Activity     Outcome Measure Help from another person eating meals?: None Help from another person taking care of personal grooming?: A Little Help from another person toileting, which includes using toliet, bedpan, or urinal?: A Lot Help from another person bathing (including washing, rinsing,  drying)?: A Lot Help from another person to put on and taking off regular upper body  clothing?: A Little Help from another person to put on and taking off regular lower body clothing?: A Lot 6 Click Score: 16   End of Session Equipment Utilized During Treatment: Gait belt;Rolling walker (2 wheels) Nurse Communication: Mobility status  Activity Tolerance: Patient tolerated treatment well Patient left: in chair;with call bell/phone within reach;with chair alarm set  OT Visit Diagnosis: Unsteadiness on feet (R26.81);Other abnormalities of gait and mobility (R26.89);Muscle weakness (generalized) (M62.81);History of falling (Z91.81)                Time: 3748-2707 OT Time Calculation (min): 33 min Charges:  OT General Charges $OT Visit: 1 Visit OT Evaluation $OT Eval Moderate Complexity: 1 Mod OT Treatments $Self Care/Home Management : 8-22 mins  Lynnda Child, OTD, OTR/L Acute Rehab [phone]) [phone] - [place] 12/28/2021, 9:31 AM

## 2021-12-28 NOTE — Progress Notes (Signed)
  Progress Note   Patient: Mark Jackson. YYT:[id number] DOB: June 05, 1930 DOA: 12/26/2021     1 DOS: the patient was seen and examined on 12/28/2021 at 0930      Brief hospital course: Mr. Concannon is an 88 y.o. M with pAF on ELiquis, hx TAVR, CAD s/p PCI >1 year, HTN, obesity, PVD, CKD IV, dCHF, BPH, prostate cancer who presented with generalized weakness, right-sided weakness worse on the left.      Assessment and Plan: * Acute CVA (cerebrovascular accident) ([place]) -Continue Eliquis, Crestor   Chronic diastolic heart failure (HCC) -Hold diltiazem, furosemide, resume tomorrow - Continue Imdur  Syncope and collapse No further syncope  Tick bite No erythema migrans noted  CAD S/P DES PCI to proximal LAD -Continue Eliquis, Crestor, Imdur    Essential hypertension Allow some permissive hypertension - Hold diltiazem, furosemide - Continue Imdur  Anemia Due to chronic kidney disease.  Hemoglobin stable relative to baseline          Subjective: Feeling well, no new complaints     Physical Exam: Vitals:   12/28/21 0326 12/28/21 0750 12/28/21 1121 12/28/21 1536  BP: (!) 160/71 (!) 141/63 (!) 141/81 122/90  Pulse: 75 69 83 71  Resp: '15 10 13 18  '$ Temp: 98.3 F (36.8 C) 98.1 F (36.7 C) 98.1 F (36.7 C) 97.9 F (36.6 C)  TempSrc: Oral Oral Oral Oral  SpO2: 98% 92% 97% 98%   Elderly adult male, sitting up in recliner, no acute distress, interactive He has some right sided discoordination with brushing his teeth, but overall strength in the right arm seems fairly close to the left, bilateral lower extremities are weak RRR, no murmurs, no peripheral edema Lungs clear without rales or wheezes Attention normal, affect appropriate, judgment and insight appear normal      Family Communication: Wife at the bedside    Disposition: Status is: Inpatient Patient was admitted with embolic stroke, NIHSS 4, severe gait instability.  He was evaluated with physical  therapy and will need placement        Author: Edwin Dada, MD 12/28/2021 5:37 PM  For on call review [email or web address].

## 2021-12-28 NOTE — Progress Notes (Signed)
Inpatient Rehab Admissions:  Inpatient Rehab Consult received.  I met with patient at the bedside for rehabilitation assessment and to discuss goals and expectations of an inpatient rehab admission.  He acknowledged  understanding of CIR goals and expectations. Pt is interested in pursuing CIR. Pt gave permission to contact daughter, Elmo Putt. She also acknowledged understanding of CIR goals and expectations.  She is supportive of pt pursuing CIR. She confirmed that family will be able to provide 24/7 support for pt after discharge. Will continue to follow.  Signed: Gayland Curry, [place], [place] Admissions Coordinator [phone]

## 2021-12-29 ENCOUNTER — Inpatient Hospital Stay (HOSPITAL_COMMUNITY)
Admission: RE | Admit: 2021-12-29 | Discharge: 2022-01-13 | DRG: 057 | Disposition: A | Discharge status: Home / Self Care | Payer: Medicare Other | Source: Intra-hospital | Attending: Physical Medicine & Rehabilitation | Admitting: Physical Medicine & Rehabilitation

## 2021-12-29 ENCOUNTER — Encounter (HOSPITAL_COMMUNITY): Payer: Self-pay | Admitting: Physical Medicine & Rehabilitation

## 2021-12-29 ENCOUNTER — Other Ambulatory Visit: Payer: Self-pay

## 2021-12-29 DIAGNOSIS — I5032 Chronic diastolic (congestive) heart failure: Secondary | ICD-10-CM | POA: Diagnosis present

## 2021-12-29 DIAGNOSIS — D631 Anemia in chronic kidney disease: Secondary | ICD-10-CM | POA: Diagnosis present

## 2021-12-29 DIAGNOSIS — Z8041 Family history of malignant neoplasm of ovary: Secondary | ICD-10-CM | POA: Diagnosis not present

## 2021-12-29 DIAGNOSIS — Z955 Presence of coronary angioplasty implant and graft: Secondary | ICD-10-CM

## 2021-12-29 DIAGNOSIS — Z8043 Family history of malignant neoplasm of testis: Secondary | ICD-10-CM

## 2021-12-29 DIAGNOSIS — I509 Heart failure, unspecified: Secondary | ICD-10-CM | POA: Diagnosis not present

## 2021-12-29 DIAGNOSIS — I350 Nonrheumatic aortic (valve) stenosis: Secondary | ICD-10-CM | POA: Diagnosis present

## 2021-12-29 DIAGNOSIS — Z87891 Personal history of nicotine dependence: Secondary | ICD-10-CM | POA: Diagnosis not present

## 2021-12-29 DIAGNOSIS — N184 Chronic kidney disease, stage 4 (severe): Secondary | ICD-10-CM | POA: Diagnosis present

## 2021-12-29 DIAGNOSIS — N400 Enlarged prostate without lower urinary tract symptoms: Secondary | ICD-10-CM | POA: Diagnosis present

## 2021-12-29 DIAGNOSIS — I2510 Atherosclerotic heart disease of native coronary artery without angina pectoris: Secondary | ICD-10-CM | POA: Diagnosis present

## 2021-12-29 DIAGNOSIS — I480 Paroxysmal atrial fibrillation: Secondary | ICD-10-CM | POA: Diagnosis present

## 2021-12-29 DIAGNOSIS — Z8546 Personal history of malignant neoplasm of prostate: Secondary | ICD-10-CM | POA: Diagnosis not present

## 2021-12-29 DIAGNOSIS — I10 Essential (primary) hypertension: Secondary | ICD-10-CM | POA: Diagnosis not present

## 2021-12-29 DIAGNOSIS — I69398 Other sequelae of cerebral infarction: Principal | ICD-10-CM

## 2021-12-29 DIAGNOSIS — R351 Nocturia: Secondary | ICD-10-CM

## 2021-12-29 DIAGNOSIS — I69351 Hemiplegia and hemiparesis following cerebral infarction affecting right dominant side: Secondary | ICD-10-CM

## 2021-12-29 DIAGNOSIS — I252 Old myocardial infarction: Secondary | ICD-10-CM | POA: Diagnosis not present

## 2021-12-29 DIAGNOSIS — D649 Anemia, unspecified: Secondary | ICD-10-CM

## 2021-12-29 DIAGNOSIS — I6381 Other cerebral infarction due to occlusion or stenosis of small artery: Secondary | ICD-10-CM | POA: Diagnosis present

## 2021-12-29 DIAGNOSIS — Z7901 Long term (current) use of anticoagulants: Secondary | ICD-10-CM

## 2021-12-29 DIAGNOSIS — N401 Enlarged prostate with lower urinary tract symptoms: Secondary | ICD-10-CM

## 2021-12-29 DIAGNOSIS — M545 Low back pain, unspecified: Secondary | ICD-10-CM | POA: Diagnosis present

## 2021-12-29 DIAGNOSIS — Z7902 Long term (current) use of antithrombotics/antiplatelets: Secondary | ICD-10-CM

## 2021-12-29 DIAGNOSIS — D539 Nutritional anemia, unspecified: Secondary | ICD-10-CM | POA: Diagnosis present

## 2021-12-29 DIAGNOSIS — E785 Hyperlipidemia, unspecified: Secondary | ICD-10-CM | POA: Diagnosis present

## 2021-12-29 DIAGNOSIS — G43909 Migraine, unspecified, not intractable, without status migrainosus: Secondary | ICD-10-CM | POA: Diagnosis present

## 2021-12-29 DIAGNOSIS — M25462 Effusion, left knee: Secondary | ICD-10-CM | POA: Diagnosis present

## 2021-12-29 DIAGNOSIS — Z79899 Other long term (current) drug therapy: Secondary | ICD-10-CM | POA: Diagnosis not present

## 2021-12-29 DIAGNOSIS — I482 Chronic atrial fibrillation, unspecified: Secondary | ICD-10-CM | POA: Diagnosis not present

## 2021-12-29 DIAGNOSIS — M25562 Pain in left knee: Secondary | ICD-10-CM | POA: Diagnosis not present

## 2021-12-29 DIAGNOSIS — Z85828 Personal history of other malignant neoplasm of skin: Secondary | ICD-10-CM | POA: Diagnosis not present

## 2021-12-29 DIAGNOSIS — I130 Hypertensive heart and chronic kidney disease with heart failure and stage 1 through stage 4 chronic kidney disease, or unspecified chronic kidney disease: Secondary | ICD-10-CM | POA: Diagnosis present

## 2021-12-29 DIAGNOSIS — G8929 Other chronic pain: Secondary | ICD-10-CM | POA: Diagnosis present

## 2021-12-29 DIAGNOSIS — Z96651 Presence of right artificial knee joint: Secondary | ICD-10-CM | POA: Diagnosis present

## 2021-12-29 DIAGNOSIS — I25119 Atherosclerotic heart disease of native coronary artery with unspecified angina pectoris: Secondary | ICD-10-CM

## 2021-12-29 DIAGNOSIS — Z952 Presence of prosthetic heart valve: Secondary | ICD-10-CM | POA: Diagnosis not present

## 2021-12-29 DIAGNOSIS — Z602 Problems related to living alone: Secondary | ICD-10-CM | POA: Diagnosis present

## 2021-12-29 DIAGNOSIS — M1712 Unilateral primary osteoarthritis, left knee: Secondary | ICD-10-CM | POA: Diagnosis present

## 2021-12-29 MED ORDER — LOPERAMIDE HCL 2 MG PO CAPS: 2.0000 mg | ORAL_CAPSULE | Freq: Four times a day (QID) | ORAL | Status: DC | PRN

## 2021-12-29 MED ORDER — EXERCISE FOR HEART AND HEALTH BOOK
Freq: Once | Status: AC
  Filled 2021-12-29: qty 1

## 2021-12-29 MED ORDER — LABETALOL HCL 5 MG/ML IV SOLN: 10.0000 mg | INTRAVENOUS | Status: DC | PRN

## 2021-12-29 MED ORDER — FUROSEMIDE 20 MG PO TABS
20.0000 mg | ORAL_TABLET | Freq: Every day | ORAL | Status: DC
  Administered 2021-12-29: 20 mg via ORAL
  Filled 2021-12-29: qty 1

## 2021-12-29 MED ORDER — ACETAMINOPHEN 325 MG PO TABS
650.0000 mg | ORAL_TABLET | ORAL | Status: DC | PRN
  Administered 2021-12-29 – 2022-01-12 (×25): 650 mg via ORAL
  Filled 2021-12-29 (×25): qty 2

## 2021-12-29 MED ORDER — ISOSORBIDE MONONITRATE ER 30 MG PO TB24
60.0000 mg | ORAL_TABLET | Freq: Every day | ORAL | Status: DC
  Administered 2021-12-30 – 2022-01-13 (×15): 60 mg via ORAL
  Filled 2021-12-29 (×15): qty 2

## 2021-12-29 MED ORDER — DILTIAZEM HCL ER COATED BEADS 120 MG PO CP24
120.0000 mg | ORAL_CAPSULE | Freq: Every day | ORAL | Status: DC
  Administered 2021-12-29: 120 mg via ORAL
  Filled 2021-12-29: qty 1

## 2021-12-29 MED ORDER — ACETAMINOPHEN 160 MG/5ML PO SOLN: 650.0000 mg | ORAL | Status: DC | PRN

## 2021-12-29 MED ORDER — FUROSEMIDE 20 MG PO TABS
20.0000 mg | ORAL_TABLET | Freq: Every day | ORAL | Status: DC
  Administered 2021-12-30 – 2022-01-12 (×14): 20 mg via ORAL
  Filled 2021-12-29 (×15): qty 1

## 2021-12-29 MED ORDER — SENNOSIDES-DOCUSATE SODIUM 8.6-50 MG PO TABS: 1.0000 | ORAL_TABLET | Freq: Every evening | ORAL | Status: DC | PRN

## 2021-12-29 MED ORDER — APIXABAN 2.5 MG PO TABS
2.5000 mg | ORAL_TABLET | Freq: Two times a day (BID) | ORAL | Status: DC
  Administered 2021-12-29 – 2022-01-13 (×30): 2.5 mg via ORAL
  Filled 2021-12-29 (×30): qty 1

## 2021-12-29 MED ORDER — ROSUVASTATIN CALCIUM 20 MG PO TABS
20.0000 mg | ORAL_TABLET | Freq: Every day | ORAL | Status: DC
  Administered 2021-12-30 – 2021-12-31 (×2): 20 mg via ORAL
  Filled 2021-12-29 (×3): qty 1

## 2021-12-29 MED ORDER — ACETAMINOPHEN 650 MG RE SUPP: 650.0000 mg | RECTAL | Status: DC | PRN

## 2021-12-29 MED ORDER — DICLOFENAC SODIUM 1 % EX GEL
2.0000 g | Freq: Four times a day (QID) | CUTANEOUS | Status: DC
  Administered 2021-12-29 – 2022-01-13 (×38): 2 g via TOPICAL
  Filled 2021-12-29 (×2): qty 100

## 2021-12-29 MED ORDER — TOPIRAMATE 25 MG PO TABS
150.0000 mg | ORAL_TABLET | Freq: Every day | ORAL | Status: DC
  Administered 2021-12-30 – 2022-01-13 (×15): 150 mg via ORAL
  Filled 2021-12-29 (×15): qty 6

## 2021-12-29 MED ORDER — EZETIMIBE 10 MG PO TABS: 10.0000 mg | ORAL_TABLET | Freq: Every day | ORAL | 11 refills | Status: DC

## 2021-12-29 MED ORDER — EZETIMIBE 10 MG PO TABS
10.0000 mg | ORAL_TABLET | Freq: Every day | ORAL | Status: DC
  Administered 2021-12-29 – 2022-01-13 (×16): 10 mg via ORAL
  Filled 2021-12-29 (×16): qty 1

## 2021-12-29 MED ORDER — DILTIAZEM HCL ER COATED BEADS 120 MG PO CP24
120.0000 mg | ORAL_CAPSULE | Freq: Every day | ORAL | Status: DC
  Administered 2021-12-30 – 2022-01-05 (×7): 120 mg via ORAL
  Filled 2021-12-29 (×7): qty 1

## 2021-12-29 NOTE — Progress Notes (Signed)
Patient arrived to unit via bed accompanied by his two daughters. Patient and daughters stated that they understood orientation to the unit. Sanda Linger, LPN

## 2021-12-29 NOTE — Progress Notes (Signed)
Inpatient Rehabilitation Admission Medication Review by a Pharmacist  A complete drug regimen review was completed for this patient to identify any potential clinically significant medication issues.  High Risk Drug Classes Is patient taking? Indication by Medication  Antipsychotic No   Anticoagulant Yes Apixaban- PAF  Antibiotic No   Opioid No   Antiplatelet No   Hypoglycemics/insulin No   Vasoactive Medication Yes Lasix, Imdur, Cardizem- hypertension, rate control  Chemotherapy No   Other Yes Crestor, Zetia- HLD Topamax- headache prophylaxis     Type of Medication Issue Identified Description of Issue Recommendation(s)  Drug Interaction(s) (clinically significant)     Duplicate Therapy     Allergy     No Medication Administration End Date     Incorrect Dose     Additional Drug Therapy Needed     Significant med changes from prior encounter (inform family/care partners about these prior to discharge).    Other       Clinically significant medication issues were identified that warrant physician communication and completion of prescribed/recommended actions by midnight of the next day:  No  Time spent performing this drug regimen review (minutes):  30   Laurie Lovejoy BS, PharmD, BCPS Clinical Pharmacist 12/29/2021 1:38 PM  Contact: [phone] after 3 PM  "Be curious, not judgmental..." -Jamal Maes
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
## 2021-12-29 NOTE — TOC Transition Note (Signed)
Transition of Care [facility]) - CM/SW Discharge Note   Patient Details  Name: Mark Jackson. MRN: [id number] Date of Birth: [birth year]  Transition of Care [facility]) CM/SW Contact:  Zenon Mayo, RN Phone Number: 12/29/2021, 11:36 AM   Clinical Narrative:    Patient for dc to CIR today.         Patient Goals and CMS Choice        Discharge Placement                       Discharge Plan and Services                                     Social Determinants of Health (SDOH) Interventions     Readmission Risk Interventions     View : No data to display.
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
## 2021-12-29 NOTE — Telephone Encounter (Signed)
Please schedule f/u if needed.

## 2021-12-29 NOTE — Telephone Encounter (Signed)
Please advise   Patient Name: Mark Jackson Gender: Male DOB: 1930/03/12 Age: 88 Y 64 M 22 D Return Phone Number: [phone] (Primary) Address: City/ State/ Zip: [place]  [address] Client [place] [facility] [place] [place] Provider [name]- MD Contact Type Call Who Is Calling Patient / Member / Family / Caregiver Call Type Triage / Clinical Caller Name Mayra Brahm III Relationship To Patient Self Return Phone Number [phone] (Primary) Chief Complaint Tick Bite Reason for Call Symptomatic / Request for [place] states father was bit by tick t 12/19/2021. He has fever, area is red and weakness. Translation No Nurse Assessment Nurse: Volanda Napoleon, RN, Wells Guiles Date/Time Eilene Ghazi Time): 12/26/2021 4:35:49 PM Confirm and document reason for call. If symptomatic, describe symptoms. ---Caller states he has severe weakness. Got bit by a tick on 5/12. Denies any chest pain or sob. Does the patient have any new or worsening symptoms? ---Yes Will a triage be completed? ---Yes Related visit to physician within the last 2 weeks? ---No Does the PT have any chronic conditions? (i.e. diabetes, asthma, this includes High risk factors for pregnancy, etc.) ---Yes List chronic conditions. ---stage 4 CKD, Is this a behavioral health or substance abuse call? ---No Guidelines Guideline Title Affirmed Question Affirmed Notes Nurse Date/Time (Eastern Time) Weakness (Generalized) and Fatigue [1] SEVERE weakness (i.e., unable to walk or barely able to walk, requires support) AND [2] new-onset or worsening [name] 12/26/2021 4:37:28 PM PLEASE NOTE: All timestamps contained within this report are represented as [place] Standard Time. CONFIDENTIALTY NOTICE: This fax transmission is intended only for the addressee. It contains information that is legally  privileged, confidential or otherwise protected from use or disclosure. If you are not the intended recipient, you are strictly prohibited from reviewing, disclosing, copying using or disseminating any of this information or taking any action in reliance on or regarding this information. If you have received this fax in error, please notify us immediately by telephone so that we can arrange for its return to [place]. Phone: [phone], Toll-Free: [phone], Fax: [phone] Page: 2 of 2 Call Id: [id number] [place]. Time Eilene Ghazi Time) Disposition Final User 12/26/2021 4:48:07 PM 911 Outcome Documentation Volanda Napoleon, RN, Wells Guiles Reason: Caller did not answer phone upon follow up call 12/26/2021 4:42:47 PM Call EMS 911 Now Yes Volanda Napoleon, RN, Romualdo Bolk Disagree/Comply Comply Caller Understands Yes PreDisposition Did not know what to do Care Advice Given Per Guideline CALL EMS 911 NOW: * Immediate medical attention is needed. You need to hang up and call 911 (or an ambulance). * Triager Discretion: I'll call you back in a few minutes to be sure you were able to reach them.

## 2021-12-29 NOTE — Progress Notes (Signed)
Patient ID: Mark Jackson., male   DOB: 1929/12/27, 88 y.o.   MRN: [id number] Met with the patient and daughters to review rehab process, team conference and plan of care. Reviewed secondary risk including HTN, HLD and Afib, HF. Reviewed medications and dietary modifications recommended. Reviewed life alert info and my chart access. Continue to follow along to discharge to address educational needs to facilitate preparation for discharge. Mark Jackson

## 2021-12-29 NOTE — Progress Notes (Signed)
Inpatient Rehab Admissions Coordinator:  There is a bed available for pt in CIR today. Dr. Loleta Books aware and in agreement. Pt, pt's daughter Elmo Putt, NSG, and TOC made aware.   Gayland Curry, [place], [place] Admissions Coordinator [phone]

## 2021-12-29 NOTE — Discharge Summary (Signed)
Physician Discharge Summary   Patient: Mark Jackson. MRN: [id number] DOB: May 21, 1930  Admit date:     12/26/2021  Discharge date: 12/29/21  Discharge Physician: Edwin Dada   PCP: [name], MD     Recommendations at discharge:  Follow up with Neurology Dr. Tomi Likens for new stroke in 6-8 weeks Follow up with PCP Dr. [name] in 1 week after discharge from SNF Follow up with Vascular Surgery Dr. Trula Slade for resumption of surveillance for carotid disease as indicated, defer to Dr. [name] to refer New start Zetia, recommend lipids in 8-12 weeks Please assist patient with Life Alert bracelet     Discharge Diagnoses: Principal Problem:   Acute CVA (cerebrovascular accident) [facility]) Active Problems:   Essential hypertension   Peripheral vascular disease   Hyperlipidemia with target LDL less than 70   Paroxysmal atrial fibrillation ([place]); CHA2DSVasc - 4; Now on Eliquis   Chronic kidney disease (CKD), active medical management without dialysis, stage 4 (severe) (HCC)   CAD S/P DES PCI to proximal LAD   Anemia due to chronic kidney disease   BPH associated with nocturia   Syncope and collapse   Chronic diastolic heart failure ([place])   S/P TAVR (transcatheter aortic valve replacement)   Tick bite     Hospital Course: Mr. [name] is an 88 y.o. M with pAF on ELiquis, hx TAVR, CAD s/p PCI >1 year, HTN, obesity, PVD, CKD IV, dCHF, BPH, prostate cancer who presented with R weakness.   Patient was in his usual state of health until the night of admission, tried to get out of bed, collapsed on the floor, lay there for 2-3 hours, and finally family arrived.  There was some initial concern that this was related to a recent tick bite.    EMS noted the patient to have R>L weakness, and on arrival to the ER, MRI brain was obtained that showed an acute thalamic stroke and subacute cerebellar stroke.      * Acute ischemic stroke, left thalamus and left cerebellum MRI brain  showed small left thalamic infarct and small left cerebellar infarct, possibly cardioembolic source in the setting of A-fib.  --> aspirin deferred given Eliquis, which was continued  Noninvasive angiography showed no significant intracranial atherosclerosis, Carotid ultrasound did show some advanced carotid disease on the right, none on the left. Patient had not seen Vascular Surgery in 2 years.  Echocardiogram showed normal EF, no cardiogenic source LDL obtained, 80 --> Crestor continued, Zetia started   tPA not given because outside window. Dysphagia screen ordered in ER.  PT recommended CIR. Nonsmoker.     Syncope and collapse Chronic and recurrent problem.  Had one syncope here while sitting on the commode, no recurrence after.  Tick bite Had a tick recently on left bicep that he had dug out, and there was a small scab.  He had no fever, rash, no erythema migrans nor other findings to suggest tickborne illness.  CAD S/P DES PCI to proximal LAD Not on aspirin or Plavix due to Eliquis  Chronic kidney disease (CKD), active medical management without dialysis, stage 4 (severe) (HCC) Creatinine stable relative to baseline  Paroxysmal atrial fibrillation                The [facility] Controlled Substances Registry was reviewed for this patient prior to discharge.   Consultants: Neurology   Disposition:  Acute inpatient rehab Diet recommendation:  Discharge Diet Orders (From admission, onward)     Start  Ordered   12/29/21 0000  Diet - low sodium heart healthy        12/29/21 1127             DISCHARGE MEDICATION: Allergies as of 12/29/2021   No Known Allergies      Medication List     STOP taking these medications    clopidogrel 75 MG tablet Commonly known as: PLAVIX       TAKE these medications    amoxicillin 500 MG tablet Commonly known as: AMOXIL Take 2,000 mg (4 capsules) 1 hour prior to all dental visits.   Centrum Silver Adult  50+ Tabs Take 1 tablet by mouth daily.   diltiazem 120 MG 24 hr capsule Commonly known as: CARDIZEM CD TAKE (1) CAPSULE DAILY. What changed: See the new instructions.   Eliquis 2.5 MG Tabs tablet Generic drug: apixaban TAKE ONE TABLET BY MOUTH TWICE DAILY What changed: how much to take   ezetimibe 10 MG tablet Commonly known as: Zetia Take 1 tablet (10 mg total) by mouth daily.   ferrous sulfate 325 (65 FE) MG tablet Take 325 mg by mouth daily with breakfast.   furosemide 40 MG tablet Commonly known as: LASIX Take 0.5 tablets (20 mg total) by mouth daily. What changed: how much to take   isosorbide mononitrate 60 MG 24 hr tablet Commonly known as: IMDUR TAKE ONE TAB DAILY AFTER BREAKFAST. MAY TAKE AN ADDITONAL TAB IN EVENING X2DAYS IF USE NITROGLYCER What changed:  how much to take how to take this when to take this additional instructions   nitroGLYCERIN 0.4 MG SL tablet Commonly known as: NITROSTAT ONE TABLET UNDER TONGUE WHEN NEEDED FOR CHEST PAIN. MAY REPEAT IN 5 MINUTES. What changed: See the new instructions.   rosuvastatin 20 MG tablet Commonly known as: CRESTOR TAKE (1) TABLET DAILY AT BEDTIME.   topiramate 100 MG tablet Commonly known as: Topamax Take 1.5 tablets (150 mg total) by mouth daily.   vitamin B-12 1000 MCG tablet Commonly known as: CYANOCOBALAMIN Take 1,000 mcg by mouth daily.        Follow-up Information     [name], MD Follow up.   Specialty: Family Medicine Contact information: [place] [address] [phone]         Serafina Mitchell, MD Follow up.   Specialties: Vascular Surgery, Cardiology Contact information: [address] [place] [address] [place], Merrick, DO .   Specialty: Neurology Contact information: [address] [place] [address] [phone]                 Discharge Instructions     Diet - low sodium heart  healthy   Complete by: As directed    Discharge instructions   Complete by: As directed    From Dr. Loleta Books: You were admitted for two small strokes, one in the left thalamus, one in the left cerebellum.   Thankfully both are small and we anticipate you will rehab well.  CONTINUE your Eliquis 2.5 mg twice daily CONTINUE your Crestor for cholesterol CONTINUE your blood pressure medicines (Imdur, furosemide and diltiazem all help lower BP)  START the new cholesterol medicine Zetia/ezetimibe once daily  Go see Dr. Tomi Likens your neurologist for follow up in about 8 weeks  Go see your primary doctor Dr. [name] when you get out of rehab  Ask Dr. [name] to refer you back to  Dr. Trula Slade, to re-evaluate your carotid (neck artery) atherosclerosis   Increase activity slowly   Complete by: As directed        Discharge Exam: BP 129/68 (BP Location: Left Arm)   Pulse 94   Temp 97.7 F (36.5 C) (Oral)   Resp 18   SpO2 100%    General: Pt is alert, awake, not in acute distress Cardiovascular: RRR, nl S1-S2, no murmurs appreciated.   No LE edema.   Respiratory: Normal respiratory rate and rhythm.  CTAB without rales or wheezes. Abdominal: Abdomen soft and non-tender.  No distension or HSM.   Neuro/Psych: Strength symmetric in upper extremities.  Judgment and insight appear normal. Legs biltaerally weak.  Attention normal, speech normal, face symemtric.   Condition at discharge: good  The results of significant diagnostics from this hospitalization (including imaging, microbiology, ancillary and laboratory) are listed below for reference.   Imaging Studies: DG Knee 1-2 Views Left  Result Date: 12/27/2021 CLINICAL DATA:  Chronic pain, previous fall EXAM: LEFT KNEE - 1-2 VIEW COMPARISON:  11/27/2019 FINDINGS: No fracture or dislocation is seen. Degenerative changes are noted with chondrocalcinosis and bony spurs. There is no significant effusion. Scattered arterial calcifications are seen in  the soft tissues. IMPRESSION: No fracture or dislocation is seen in the left knee. Degenerative changes are noted with chondrocalcinosis and bony spurs. Electronically Signed   By: Elmer Picker M.D.   On: 12/27/2021 10:20   CT Head Wo Contrast  Result Date: 12/26/2021 CLINICAL DATA:  Neuro deficit stroke suspected EXAM: CT HEAD WITHOUT CONTRAST TECHNIQUE: Contiguous axial images were obtained from the base of the skull through the vertex without intravenous contrast. RADIATION DOSE REDUCTION: This exam was performed according to the departmental dose-optimization program which includes automated exposure control, adjustment of the mA and/or kV according to patient size and/or use of iterative reconstruction technique. COMPARISON:  04/07/2020 FINDINGS: Brain: No evidence of acute infarction, hemorrhage, cerebral edema, mass, mass effect, or midline shift. No hydrocephalus or acute extra-axial fluid collection. Redemonstrated left middle cranial fossa arachnoid cyst, unchanged. Vascular: No hyperdense vessel. Skull: Normal. Negative for fracture or focal lesion. Sinuses/Orbits: No acute finding. Status post bilateral lens replacements. Other: The mastoid air cells are well aerated. IMPRESSION: No acute intracranial process. Electronically Signed   By: Merilyn Baba M.D.   On: 12/26/2021 19:40   MR ANGIO HEAD WO CONTRAST  Result Date: 12/27/2021 CLINICAL DATA:  Stroke follow-up EXAM: MRA HEAD WITHOUT CONTRAST TECHNIQUE: Angiographic images of the Circle of Willis were acquired using MRA technique without intravenous contrast. COMPARISON:  None Available. FINDINGS: POSTERIOR CIRCULATION: --Vertebral arteries: Normal --Inferior cerebellar arteries: Normal. --Basilar artery: Normal. --Superior cerebellar arteries: Normal. --Posterior cerebral arteries: Normal. ANTERIOR CIRCULATION: --Intracranial internal carotid arteries: Normal. --Anterior cerebral arteries (ACA): Normal. --Middle cerebral arteries (MCA):  Normal. ANATOMIC VARIANTS: None Incidental left middle cranial fossa arachnoid cyst. IMPRESSION: Normal intracranial MRA. Electronically Signed   By: Ulyses Jarred M.D.   On: 12/27/2021 01:02   MR BRAIN WO CONTRAST  Result Date: 12/26/2021 CLINICAL DATA:  Stroke, follow up, right arm and leg weakness EXAM: MRI HEAD WITHOUT CONTRAST TECHNIQUE: Multiplanar, multiecho pulse sequences of the brain and surrounding structures were obtained without intravenous contrast. COMPARISON:  July 2022 FINDINGS: Brain: Small focus of reduced diffusion in the left thalamocapsular region. Additional punctate focus diffusion hyperintensity in the left cerebellum. Left middle cranial fossa arachnoid cyst is again noted. Prominence of the ventricles and sulci reflects similar parenchymal volume loss. Patchy and  confluent areas of T2 hyperintensity in the supratentorial white matter are nonspecific but may reflect stable chronic microvascular ischemic changes. Small chronic bilateral cerebellar, left occipital, and left parietal infarcts. No intracranial mass or mass effect. No extra-axial collection. No hydrocephalus. Vascular: Major vessel flow voids at the skull base are preserved. Skull and upper cervical spine: Normal marrow signal is preserved. Sinuses/Orbits: Paranasal sinuses are aerated. Orbits are unremarkable. Other: Sella is unremarkable.  Mastoid air cells are clear. IMPRESSION: Small acute infarct of the left thalamocapsular region. Additional more subacute appearing small left cerebellar infarct. Chronic infarcts and chronic microvascular ischemic changes as detailed above. Electronically Signed   By: Macy Mis M.D.   On: 12/26/2021 20:45   DG Chest Portable 1 View  Result Date: 12/26/2021 CLINICAL DATA:  Altered mental status EXAM: PORTABLE CHEST 1 VIEW COMPARISON:  12/17/2020 FINDINGS: Lungs are clear. No pleural effusion or pneumothorax. Similar cardiomediastinal contours allowing for differences in  technique. Post TAVR. IMPRESSION: No acute process in the chest. Electronically Signed   By: Macy Mis M.D.   On: 12/26/2021 21:21   ECHOCARDIOGRAM COMPLETE  Result Date: 12/27/2021    ECHOCARDIOGRAM REPORT   Patient Name:   Zyheir Daft. Date of Exam: 12/27/2021 Medical Rec #:  [id number]          Height:       68.0 in Accession #:    [id number]         Weight:       183.6 lb Date of Birth:  27-May-1930         BSA:          1.971 m Patient Age:    67 years           BP:           162/82 mmHg Patient Gender: M                  HR:           74 bpm. Exam Location:  Inpatient Procedure: 2D Echo Indications:    stroke  History:        Patient has prior history of Echocardiogram examinations, most                 recent 02/26/2021. CAD, chronic kidney disease; Risk                 Factors:Hypertension and Dyslipidemia.                 Aortic Valve: TAVR valve is present in the aortic position.  Sonographer:    Johny Chess RDCS Referring Phys: [id number] [place]  1. Left ventricular ejection fraction, by estimation, is 55 to 60%. The left ventricle has normal function. The left ventricle has no regional wall motion abnormalities. Left ventricular diastolic parameters are indeterminate.  2. Right ventricular systolic function is normal. The right ventricular size is normal. There is mildly elevated pulmonary artery systolic pressure.  3. Left atrial size was moderately dilated.  4. Trivial mitral valve regurgitation.  5. S/p TAVR (26 mm Sapien valve, implant date 12/12/19) Peak and mean gradients through the valve are 18 and 10 mm Hg respectively. AVA (VTI) 1.62 cm2. Compared to echo report from July 2022 mean gradient is improved (16 to 10 mm). The aortic valve has been repaired/replaced. Aortic valve regurgitation is not visualized. There is a TAVR valve present in the aortic position. FINDINGS  Left Ventricle: Left  ventricular ejection fraction, by estimation, is 55 to 60%. The left  ventricle has normal function. The left ventricle has no regional wall motion abnormalities. The left ventricular internal cavity size was normal in size. There is  no left ventricular hypertrophy. Left ventricular diastolic parameters are indeterminate. Right Ventricle: The right ventricular size is normal. Right vetricular wall thickness was not assessed. Right ventricular systolic function is normal. There is mildly elevated pulmonary artery systolic pressure. The tricuspid regurgitant velocity is 2.90 m/s, and with an assumed right atrial pressure of 3 mmHg, the estimated right ventricular systolic pressure is 29.9 mmHg. Left Atrium: Left atrial size was moderately dilated. Right Atrium: Right atrial size was normal in size. Pericardium: There is no evidence of pericardial effusion. Mitral Valve: There is mild thickening of the mitral valve leaflet(s). There is mild calcification of the mitral valve leaflet(s). Mild mitral annular calcification. Trivial mitral valve regurgitation. Tricuspid Valve: The tricuspid valve is normal in structure. Tricuspid valve regurgitation is trivial. Aortic Valve: S/p TAVR (26 mm Sapien valve, implant date 12/12/19) Peak and mean gradients through the valve are 18 and 10 mm Hg respectively. AVA (VTI) 1.62 cm2. Compared to echo report from July 2022 mean gradient is improved (16 to 10 mm). The aortic valve has been repaired/replaced. Aortic valve regurgitation is not visualized. Aortic valve mean gradient measures 10.0 mmHg. Aortic valve peak gradient measures 18.1 mmHg. Aortic valve area, by VTI measures 1.67 cm. There is a TAVR valve present in the aortic position. Pulmonic Valve: The pulmonic valve was normal in structure. Pulmonic valve regurgitation is not visualized. Aorta: The aortic root and ascending aorta are structurally normal, with no evidence of dilitation. IAS/Shunts: No atrial level shunt detected by color flow Doppler.  LEFT VENTRICLE PLAX 2D LVIDd:         4.80 cm    Diastology LVIDs:         3.50 cm   LV e' medial:    5.87 cm/s LV PW:         1.10 cm   LV E/e' medial:  12.3 LV IVS:        1.10 cm   LV e' lateral:   8.70 cm/s LVOT diam:     1.70 cm   LV E/e' lateral: 8.3 LV SV:         77 LV SV Index:   39 LVOT Area:     2.27 cm  RIGHT VENTRICLE RV Basal diam:  3.00 cm RV S prime:     10.30 cm/s TAPSE (M-mode): 2.0 cm LEFT ATRIUM           Index        RIGHT ATRIUM           Index LA diam:      3.70 cm 1.88 cm/m   RA Area:     16.00 cm LA Vol (A4C): 79.9 ml 40.54 ml/m  RA Volume:   41.00 ml  20.80 ml/m  AORTIC VALVE AV Area (Vmax):    1.66 cm AV Area (Vmean):   1.62 cm AV Area (VTI):     1.67 cm AV Vmax:           213.00 cm/s AV Vmean:          149.000 cm/s AV VTI:            0.462 m AV Peak Grad:      18.1 mmHg AV Mean Grad:      10.0 mmHg LVOT  Vmax:         155.67 cm/s LVOT Vmean:        106.567 cm/s LVOT VTI:          0.341 m LVOT/AV VTI ratio: 0.74  AORTA Ao Asc diam: 3.30 cm MITRAL VALVE                TRICUSPID VALVE MV Area (PHT): 4.68 cm     TR Peak grad:   33.6 mmHg MV Decel Time: 162 msec     TR Vmax:        290.00 cm/s MV E velocity: 72.40 cm/s MV A velocity: 128.00 cm/s  SHUNTS MV E/A ratio:  0.57         Systemic VTI:  0.34 m                             Systemic Diam: 1.70 cm Dorris Carnes MD Electronically signed by Dorris Carnes MD Signature Date/Time: 12/27/2021/1:09:44 PM    Final    VAS US CAROTID  Result Date: 12/29/2021 Carotid Arterial Duplex Study Patient Name:  Hurley Sobel.  Date of Exam:   12/27/2021 Medical Rec #: [id number]           Accession #:    [id number] Date of Birth: 06-11-1930          Patient Gender: M Patient Age:   65 years Exam Location:  [facility] Procedure:      VAS US CAROTID Referring Phys: Amie [place] --------------------------------------------------------------------------------  Indications:       CVA, Carotid artery disease and Weakness. Risk Factors:      Hypertension, hyperlipidemia, coronary artery disease.  Other Factors:     Atrial fibrillation, CHF, CKD IV, TAVR. Comparison Study:  Prior study done 10/30/19 indicating 60-79% right ICA stenosis                    and 1-39% left ICA stenosis. Performing Technologist: Sharion Dove RVS  Examination Guidelines: A complete evaluation includes B-mode imaging, spectral Doppler, color Doppler, and power Doppler as needed of all accessible portions of each vessel. Bilateral testing is considered an integral part of a complete examination. Limited examinations for reoccurring indications may be performed as noted.  Right Carotid Findings: +----------+--------+--------+--------+----------------------+--------+           PSV cm/sEDV cm/sStenosisPlaque Description    Comments +----------+--------+--------+--------+----------------------+--------+ CCA Prox  77      18              homogeneous                    +----------+--------+--------+--------+----------------------+--------+ CCA Distal69      22              homogeneous                    +----------+--------+--------+--------+----------------------+--------+ ICA Prox  318     69      60-79%  irregular and calcific         +----------+--------+--------+--------+----------------------+--------+ ICA Mid   263     58                                             +----------+--------+--------+--------+----------------------+--------+ ICA Distal53      16                                             +----------+--------+--------+--------+----------------------+--------+  ECA       160     21                                             +----------+--------+--------+--------+----------------------+--------+ +----------+--------+-------+--------+-------------------+           PSV cm/sEDV cmsDescribeArm Pressure (mmHG) +----------+--------+-------+--------+-------------------+ [id number]                                          +----------+--------+-------+--------+-------------------+ +---------+--------+--+--------+--+ VertebralPSV cm/s48EDV cm/s13 +---------+--------+--+--------+--+  Left Carotid Findings: +----------+--------+--------+--------+------------------+--------+           PSV cm/sEDV cm/sStenosisPlaque DescriptionComments +----------+--------+--------+--------+------------------+--------+ CCA Prox  118     25                                         +----------+--------+--------+--------+------------------+--------+ CCA Distal99      25                                         +----------+--------+--------+--------+------------------+--------+ ICA Prox  107     34      1-39%                              +----------+--------+--------+--------+------------------+--------+ ICA Distal66      19                                         +----------+--------+--------+--------+------------------+--------+ ECA       89      21                                         +----------+--------+--------+--------+------------------+--------+ +----------+--------+--------+--------+-------------------+           PSV cm/sEDV cm/sDescribeArm Pressure (mmHG) +----------+--------+--------+--------+-------------------+ [id number]                                         +----------+--------+--------+--------+-------------------+ +---------+--------+--+--------+--+ VertebralPSV cm/s83EDV cm/s24 +---------+--------+--+--------+--+   Summary: Right Carotid: Velocities in the right ICA are consistent with a 60-79%                stenosis. Velocities have not shown a significant increase since                prior study done 10/30/19. Left Carotid: Velocities in the left ICA are consistent with a 1-39% stenosis. Vertebrals:  Bilateral vertebral arteries demonstrate antegrade flow. Subclavians: Normal flow hemodynamics were seen in bilateral subclavian              arteries. *See table(s)  above for measurements and observations.  Electronically signed by Antony Contras MD on 12/29/2021 at 11:21:28 AM.    Final     Microbiology: Results for orders placed or performed in visit on 03/26/21  Urine Culture  Status: None   Collection Time: 03/26/21 12:13 PM   Specimen: Urine  Result Value Ref Range Status   MICRO NUMBER: [id number]  Final   SPECIMEN QUALITY: Adequate  Final   Sample Source URINE  Final   STATUS: FINAL  Final   ISOLATE 1:   Final    Mixed genital flora isolated. These superficial bacteria are not indicative of a urinary tract infection. No further organism identification is warranted on this specimen. If clinically indicated, recollect clean-catch, mid-stream urine and transfer  immediately to Urine Culture Transport Tube.     Labs: CBC: Recent Labs  Lab 12/26/21 1902 12/28/21 0152  WBC 11.4* 10.3  NEUTROABS 8.6*  --   HGB 11.8* 9.9*  HCT 37.0* 30.8*  MCV 104.2* 104.1*  PLT 126* 546*   Basic Metabolic Panel: Recent Labs  Lab 12/26/21 1902 12/28/21 0152  NA 141 141  K 4.3 4.4  CL 115* 116*  CO2 18* 20*  GLUCOSE 99 105*  BUN 61* 56*  CREATININE 2.88* 2.69*  CALCIUM 8.6* 8.3*   Liver Function Tests: Recent Labs  Lab 12/26/21 1902 12/28/21 0152  AST 22 19  ALT 13 12  ALKPHOS 51 47  BILITOT 0.5 0.4  PROT 6.6 5.6*  ALBUMIN 3.4* 2.8*   CBG: No results for input(s): GLUCAP in the last 168 hours.  Discharge time spent: approximately 25 minutes spent on discharge counseling, evaluation of patient on day of discharge, and coordination of discharge planning with nursing, social work, pharmacy and case management  Signed: Edwin Dada, MD [facility] 12/29/2021

## 2021-12-29 NOTE — Progress Notes (Signed)
Mr. Bramble is alert and oriented x4. Breathing is even and nonlabored. Transported pt via bed to 4M07 with daughters at bedside. Belongings taken with pt.
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
## 2021-12-29 NOTE — Discharge Instructions (Addendum)
Inpatient Rehab Discharge Instructions  Mark Jackson. Discharge date and time: No discharge date for patient encounter.   Activities/Precautions/ Functional Status: Activity: activity as tolerated Diet: regular diet Wound Care: Routine skin checks Functional status:  ___ No restrictions     ___ Walk up steps independently ___ 24/7 supervision/assistance   ___ Walk up steps with assistance ___ Intermittent supervision/assistance  ___ Bathe/dress independently ___ Walk with walker     _x__ Bathe/dress with assistance ___ Walk Independently    ___ Shower independently ___ Walk with assistance    ___ Shower with assistance ___ No alcohol     ___ Return to work/school ________  COMMUNITY REFERRALS UPON DISCHARGE:     Outpatient: PT     OT                Agency: [facility] Phone: [phone]              Appointment Date/Time: TBD  Medical Equipment/Items Ordered: Bedside Commode                                                 Agency/Supplier: [employer] [phone]   Special Instructions:  No driving smoking or alcoholSTROKE/TIA DISCHARGE INSTRUCTIONS SMOKING Cigarette smoking nearly doubles your risk of having a stroke & is the single most alterable risk factor  If you smoke or have smoked in the last 12 months, you are advised to quit smoking for your health. Most of the excess cardiovascular risk related to smoking disappears within a year of stopping. Ask you doctor about anti-smoking medications [place] Quit Line: 1-800-QUIT NOW Free Smoking Cessation Classes [phone]  CHOLESTEROL Know your levels; limit fat & cholesterol in your diet  Lipid Panel     Component Value Date/Time   CHOL 132 12/27/2021 0245   TRIG 63 12/27/2021 0245   HDL 39 (L) 12/27/2021 0245   CHOLHDL 3.4 12/27/2021 0245   VLDL 13 12/27/2021 0245   LDLCALC 80 12/27/2021 0245     Many patients benefit from treatment even if their cholesterol is at goal. Goal: Total  Cholesterol (CHOL) less than 160 Goal:  Triglycerides (TRIG) less than 150 Goal:  HDL greater than 40 Goal:  LDL (LDLCALC) less than 100   BLOOD PRESSURE American Stroke Association blood pressure target is less that 120/80 mm/Hg  Your discharge blood pressure is:    Monitor your blood pressure Limit your salt and alcohol intake Many individuals will require more than one medication for high blood pressure  DIABETES (A1c is a blood sugar average for last 3 months) Goal HGBA1c is under 7% (HBGA1c is blood sugar average for last 3 months)  Diabetes: No known diagnosis of diabetes    Lab Results  Component Value Date   HGBA1C 5.8 (H) 12/27/2021    Your HGBA1c can be lowered with medications, healthy diet, and exercise. Check your blood sugar as directed by your physician Call your physician if you experience unexplained or low blood sugars.  PHYSICAL ACTIVITY/REHABILITATION Goal is 30 minutes at least 4 days per week  Activity: Increase activity slowly, Therapies: Physical Therapy: Home Health Return to work:  Activity decreases your risk of heart attack and stroke and makes your heart stronger.  It helps control your weight and blood pressure; helps you relax and can improve your mood. Participate  in a regular exercise program. Talk with your doctor about the best form of exercise for you (dancing, walking, swimming, cycling).  DIET/WEIGHT Goal is to maintain a healthy weight  Your discharge diet is:  Diet Order             Diet Heart Room service appropriate? No; Fluid consistency: Thin  Diet effective now                   liquids Your height is:    Your current weight is:   Your Body Mass Index (BMI) is:    Following the type of diet specifically designed for you will help prevent another stroke. Your goal weight range is:   Your goal Body Mass Index (BMI) is 19-24. Healthy food habits can help reduce 3 risk factors for stroke:  High cholesterol, hypertension, and excess  weight.  RESOURCES Stroke/Support Group:  Call [phone]   STROKE EDUCATION PROVIDED/REVIEWED AND GIVEN TO PATIENT Stroke warning signs and symptoms How to activate emergency medical system (call 911). Medications prescribed at discharge. Need for follow-up after discharge. Personal risk factors for stroke. Pneumonia vaccine given: No Flu vaccine given: No My questions have been answered, the writing is legible, and I understand these instructions.  I will adhere to these goals & educational materials that have been provided to me after my discharge from the hospital.      My questions have been answered and I understand these instructions. I will adhere to these goals and the provided educational materials after my discharge from the hospital.  Patient/Caregiver Signature _______________________________ Date __________  Clinician Signature _______________________________________ Date __________  Please bring this form and your medication list with you to all your follow-up doctor's appointments.
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
## 2021-12-30 DIAGNOSIS — I6381 Other cerebral infarction due to occlusion or stenosis of small artery: Secondary | ICD-10-CM | POA: Diagnosis not present

## 2021-12-30 LAB — CBC WITH DIFFERENTIAL/PLATELET
Abs Immature Granulocytes: 0.06 10*3/uL (ref 0.00–0.07)
Basophils Absolute: 0 10*3/uL (ref 0.0–0.1)
Basophils Relative: 0 %
Eosinophils Absolute: 0.2 10*3/uL (ref 0.0–0.5)
Eosinophils Relative: 2 %
HCT: 29.5 % — ABNORMAL LOW (ref 39.0–52.0)
Hemoglobin: 10 g/dL — ABNORMAL LOW (ref 13.0–17.0)
Immature Granulocytes: 1 %
Lymphocytes Relative: 10 %
Lymphs Abs: 1.2 10*3/uL (ref 0.7–4.0)
MCH: 33.8 pg (ref 26.0–34.0)
MCHC: 33.9 g/dL (ref 30.0–36.0)
MCV: 99.7 fL (ref 80.0–100.0)
Monocytes Absolute: 1.7 10*3/uL — ABNORMAL HIGH (ref 0.1–1.0)
Monocytes Relative: 15 %
Neutro Abs: 8.5 10*3/uL — ABNORMAL HIGH (ref 1.7–7.7)
Neutrophils Relative %: 72 %
Platelets: 127 10*3/uL — ABNORMAL LOW (ref 150–400)
RBC: 2.96 MIL/uL — ABNORMAL LOW (ref 4.22–5.81)
RDW: 12.6 % (ref 11.5–15.5)
WBC: 11.6 10*3/uL — ABNORMAL HIGH (ref 4.0–10.5)
nRBC: 0 % (ref 0.0–0.2)

## 2021-12-30 LAB — COMPREHENSIVE METABOLIC PANEL
ALT: 12 U/L (ref 0–44)
AST: 17 U/L (ref 15–41)
Albumin: 2.7 g/dL — ABNORMAL LOW (ref 3.5–5.0)
Alkaline Phosphatase: 46 U/L (ref 38–126)
Anion gap: 5 (ref 5–15)
BUN: 57 mg/dL — ABNORMAL HIGH (ref 8–23)
CO2: 20 mmol/L — ABNORMAL LOW (ref 22–32)
Calcium: 8.1 mg/dL — ABNORMAL LOW (ref 8.9–10.3)
Chloride: 113 mmol/L — ABNORMAL HIGH (ref 98–111)
Creatinine, Ser: 2.81 mg/dL — ABNORMAL HIGH (ref 0.61–1.24)
GFR, Estimated: 21 mL/min — ABNORMAL LOW (ref >60)
Glucose, Bld: 106 mg/dL — ABNORMAL HIGH (ref 70–99)
Potassium: 4.4 mmol/L (ref 3.5–5.1)
Sodium: 138 mmol/L (ref 135–145)
Total Bilirubin: 0.9 mg/dL (ref 0.3–1.2)
Total Protein: 5.8 g/dL — ABNORMAL LOW (ref 6.5–8.1)

## 2021-12-30 NOTE — Progress Notes (Signed)
Inpatient Rehabilitation  Patient information reviewed and entered into eRehab system by Jaslyn Bansal M. Willadeen Colantuono, M.A., CCC/SLP, PPS Coordinator.  Information including medical coding, functional ability and quality indicators will be reviewed and updated through discharge.    
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
## 2021-12-30 NOTE — Progress Notes (Signed)
Physical Therapy Session Note  Patient Details  Name: Mark Jackson. MRN: [id number] Date of Birth: 06-12-1930  Today's Date: 12/30/2021 PT Individual Time: 6811-5726 PT Individual Time Calculation (min): 60 min   Short Term Goals: Week 1:  PT Short Term Goal 1 (Week 1): Pt will perform bed mobility using no bed features with CGA. PT Short Term Goal 2 (Week 1): Pt will perform all functional standing transfers with supervision and no posterior bias. PT Short Term Goal 3 (Week 1): Pt will ambulate 150 feet consistently with CGA/ supervision using LRAD. PT Short Term Goal 4 (Week 1): Pt will ambulate steps using BHR with reciprocating gait pattern and CGA.  Skilled Therapeutic Interventions/Progress Updates:  Patient seated upright in recliner on entrance to room. Patient alert and agreeable to PT session. Orders for TED hose placement for LE swelling, but no TED hose in room. Educated pt to allow staff to assist with donning and to wear during day and remove at night.   Patient with no pain complaint throughout session.  Therapeutic Activity: Transfers: Patient performed sit<>stand and stand pivot transfers throughout session with posterior bias and toes  lifted from floor. Requires push from armrests and Min A to bring weight forward.  Provided verbal cues for more posterior foot placement but pt unable to bring feet more posterior possibly d/t increased LE swelling.   Gait Training:  Patient ambulated in-room distances and then 158' using RW with overall CGA and one instance of sliding foot forward. Provided vc/ tc for upright posture and level gaze with increased hip/knee flexion and ankle DF for improved floor clearance.   Pt taken to stairs to assess functional strength in hip flexion with pt able to place R foot on step and slide foot from step. R knee maintains strong hold in extension to allow for L foot to reach step. Pt begins to ascend steps leading with RLE and heavy lean  into BUE on rails. MinA and good technique when leading with LLE. Ascends 3 steps then descends backwards and self-chooses to lead with RLE. CGA/ Min A to descend.   Guided in curb step technique using RW. Demonstrated technique with verbal instructions and then guided pt with vc/ tc throughout. Performs with MinA/ CGA and following instructions for safe technique.   Patient seated in recliner at end of session with brakes locked, belt alarm set, and all needs within reach. Relates upcoming need to toilet and NT notified as to pt's disposition.  Therapy Documentation Precautions:  Precautions Precautions: Fall Precaution Comments: R hemiparesis RLE>RUE Restrictions Weight Bearing Restrictions: No General:   Vital Signs:   Pain: Pain Assessment Pain Scale: 0-10 Pain Score: 0-No pain Mobility: Bed Mobility Bed Mobility: Sit to Supine;Supine to Sit Supine to Sit: Contact Guard/Touching assist;Minimal Assistance - Patient > 75% Sit to Supine: Minimal Assistance - Patient > 75% Transfers Transfers: Sit to Stand;Stand to [employer] Transfers Sit to Stand: Moderate Assistance - Patient 50-74% Stand to Sit: Minimal Assistance - Patient > 75% Stand Pivot Transfers: Moderate Assistance - Patient 50 - 74% Transfer (Assistive device): Rolling walker Locomotion : Gait Ambulation: Yes Gait Assistance: Minimal Assistance - Patient > 75% Gait Distance (Feet): 43 Feet Assistive device: Rolling walker Gait Assistance Details: Verbal cues for gait pattern;Verbal cues for precautions/safety Gait Gait: Yes Gait Pattern: Impaired Gait Pattern: Decreased step length - right;Decreased stance time - right;Decreased hip/knee flexion - right;Decreased weight shift to right Gait velocity: reduced Stairs / Additional Locomotion Stairs: Yes  Stairs Assistance: Minimal Assistance - Patient > 75% Stair Management Technique: Two rails;Step to pattern;Backwards;Forwards Number of Stairs: 3 Height  of Stairs: 6 Curb: Minimal Assistance - Patient >75% Wheelchair Mobility Wheelchair Mobility: No  Trunk/Postural Assessment : Cervical Assessment Cervical Assessment: Exceptions to [facility] (forward head) Thoracic Assessment Thoracic Assessment: Exceptions to [facility] (rounded shoulders) Lumbar Assessment Lumbar Assessment: Exceptions to [facility] (posterior pelvic tilt when seated EOB) Postural Control Postural Control: Deficits on evaluation (posterior bias)  Balance: Balance Balance Assessed: Yes Static Sitting Balance Static Sitting - Balance Support: No upper extremity supported;Feet supported Static Sitting - Level of Assistance: 4: Min assist Dynamic Sitting Balance Sitting balance - Comments: difficulty reaching outside BOS without LOB, posterior lean Static Standing Balance Static Standing - Balance Support: Bilateral upper extremity supported;During functional activity Static Standing - Level of Assistance: 4: Min assist Dynamic Standing Balance Dynamic Standing - Balance Support: Right upper extremity supported;During functional activity Dynamic Standing - Level of Assistance: 3: Mod assist   Therapy/Group: Individual Therapy  Alger Simons PT, DPT 12/30/2021, 6:53 PM

## 2021-12-30 NOTE — Progress Notes (Signed)
PROGRESS NOTE   Subjective/Complaints:  No issues overnite   ROS- neg CP SOB , neg N/V, pt states he can manage urinal , has had BM daily per roeort   Objective:   No results found. Recent Labs    12/28/21 0152 12/30/21 0532  WBC 10.3 11.6*  HGB 9.9* 10.0*  HCT 30.8* 29.5*  PLT 120* 127*   Recent Labs    12/28/21 0152 12/30/21 0532  NA 141 138  K 4.4 4.4  CL 116* 113*  CO2 20* 20*  GLUCOSE 105* 106*  BUN 56* 57*  CREATININE 2.69* 2.81*  CALCIUM 8.3* 8.1*    Intake/Output Summary (Last 24 hours) at 12/30/2021 0806 Last data filed at 12/30/2021 0431 Gross per 24 hour  Intake --  Output 300 ml  Net -300 ml        Physical Exam: Vital Signs Blood pressure (!) 163/69, pulse 72, temperature 98.1 F (36.7 C), temperature source Oral, resp. rate 18, height '5\' 8"'$  (1.727 m), weight 83 kg, SpO2 99 %.   General: No acute distress Mood and affect are appropriate Heart: Regular rate and rhythm no rubs murmurs or extra sounds Lungs: Clear to auscultation, breathing unlabored, no rales or wheezes Abdomen: Positive bowel sounds, soft nontender to palpation, nondistended Extremities: No clubbing, cyanosis, or edema Skin: No evidence of breakdown, no evidence of rash Neurologic: Cranial nerves II through XII intact, motor strength is 5/5 in bilateral deltoid, bicep, tricep, grip,3- hip flexor, knee extensors, ankle dorsiflexor and plantar flexor Sensory exam normal sensation to light touch  bilateral upper and lower extremities Cerebellar exam normal finger to nose to finger mild dysmetria L FNF Musculoskeletal: Full range of motion in all 4 extremities. No joint swelling   Assessment/Plan: 1. Functional deficits which require 3+ hours per day of interdisciplinary therapy in a comprehensive inpatient rehab setting. Physiatrist is providing close team supervision and 24 hour management of active medical problems  listed below. Physiatrist and rehab team continue to assess barriers to discharge/monitor patient progress toward functional and medical goals  Care Tool:  Bathing              Bathing assist       Upper Body Dressing/Undressing Upper body dressing        Upper body assist      Lower Body Dressing/Undressing Lower body dressing            Lower body assist       Toileting Toileting    Toileting assist       Transfers Chair/bed transfer  Transfers assist           Locomotion Ambulation   Ambulation assist              Walk 10 feet activity   Assist           Walk 50 feet activity   Assist           Walk 150 feet activity   Assist           Walk 10 feet on uneven surface  activity   Assist  Wheelchair     Assist               Wheelchair 50 feet with 2 turns activity    Assist            Wheelchair 150 feet activity     Assist          Blood pressure (!) 163/69, pulse 72, temperature 98.1 F (36.7 C), temperature source Oral, resp. rate 18, height '5\' 8"'$  (1.727 m), weight 83 kg, SpO2 99 %.  Medical Problem List and Plan: 1. Functional deficits secondary to left thalamic infarction and small left cerebellar infarct             -patient may shower             -ELOS/Goals: 10-12 days, mod I to supervision goals initial evals today  2.  Antithrombotics: -DVT/anticoagulation:  Pharmaceutical: Other (comment) Eliquis             -antiplatelet therapy: N/A 3. Pain Management: Tylenol as needed             -add voltaren gel QID for endstage OA left knee 4. Mood: Provide emotional support             -antipsychotic agents: N/A 5. Neuropsych: This patient is capable of making decisions on his own behalf. 6. Skin/Wound Care: Routine skin checks 7. Fluids/Electrolytes/Nutrition: Routine in and outs with follow-up chemistries 8.  Atrial fibrillation.  Cardizem 120 mg daily.   Continue Eliquis.  Cardiac rate controlled at present 9.  Hyperlipidemia.  Crestor 10.  Diastolic congestive heart failure.  Lasix 20 mg daily.  Monitor for any signs of fluid overload             -daily weights 11.  CAD/stenting/TAVR.  Continue Imdur 60 mg daily 12.  CKD stage IV.  Check CMET tomorrow BUN/Creat stable 5/23 13.  History of migraine headaches.  Topamax 150 mg nightly 14.  Chronic anemia.  F/u cbc on admit 15.  BPH with history of prostate cancer.  Check PVR and follow for patterns. Pt denies any emptying issues at present    LOS: 1 days A FACE TO FACE EVALUATION WAS PERFORMED  Charlett Blake 12/30/2021, 8:06 AM

## 2021-12-30 NOTE — Progress Notes (Signed)
[facility] Individual Statement of Services  Patient Name:  Mark Jackson.  Date:  12/30/2021  Welcome to the [facility].  Our goal is to provide you with an individualized program based on your diagnosis and situation, designed to meet your specific needs.  With this comprehensive rehabilitation program, you will be expected to participate in at least 3 hours of rehabilitation therapies Monday-Friday, with modified therapy programming on the weekends.  Your rehabilitation program will include the following services:  Physical Therapy (PT), Occupational Therapy (OT), Speech Therapy (ST), 24 hour per day rehabilitation nursing, Therapeutic Recreaction (TR), Neuropsychology, Care Coordinator, Rehabilitation Medicine, Nutrition Services, and Pharmacy Services  Weekly team conferences will be held on Wednesdays to discuss your progress.  Your Inpatient Rehabilitation Care Coordinator will talk with you frequently to get your input and to update you on team discussions.  Team conferences with you and your family in attendance may also be held.  Expected length of stay:  10-12 Days  Overall anticipated outcome:  Supervision-MOD I  Depending on your progress and recovery, your program may change. Your Inpatient Rehabilitation Care Coordinator will coordinate services and will keep you informed of any changes. Your Inpatient Rehabilitation Care Coordinator's name and contact numbers are listed  below.  The following services may also be recommended but are not provided by the [facility]:   [place] will be made to provide these services after discharge if needed.  Arrangements include referral to agencies that provide these services.  Your insurance has been verified to be:   Medicare A & B Your primary doctor is:  [name], MD  Pertinent  information will be shared with your doctor and your insurance company.  Inpatient Rehabilitation Care Coordinator:  Erlene Quan, [place] or [phone]  Information discussed with and copy given to patient by: Dyanne Iha, 12/30/2021, 10:24 AM
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
## 2021-12-30 NOTE — Plan of Care (Signed)
  Problem: RH Grooming Goal: LTG Patient will perform grooming w/assist,cues/equip (OT) Description: LTG: Patient will perform grooming with assist, with/without cues using equipment (OT) Flowsheets (Taken 12/30/2021 1243) LTG: Pt will perform grooming with assistance level of: Independent with assistive device    Problem: RH Bathing Goal: LTG Patient will bathe all body parts with assist levels (OT) Description: LTG: Patient will bathe all body parts with assist levels (OT) Flowsheets (Taken 12/30/2021 1243) LTG: Pt will perform bathing with assistance level/cueing: Supervision/Verbal cueing   Problem: RH Dressing Goal: LTG Patient will perform upper body dressing (OT) Description: LTG Patient will perform upper body dressing with assist, with/without cues (OT). Flowsheets (Taken 12/30/2021 1243) LTG: Pt will perform upper body dressing with assistance level of: Independent with assistive device Goal: LTG Patient will perform lower body dressing w/assist (OT) Description: LTG: Patient will perform lower body dressing with assist, with/without cues in positioning using equipment (OT) Flowsheets (Taken 12/30/2021 1243) LTG: Pt will perform lower body dressing with assistance level of: Independent with assistive device   Problem: RH Toileting Goal: LTG Patient will perform toileting task (3/3 steps) with assistance level (OT) Description: LTG: Patient will perform toileting task (3/3 steps) with assistance level (OT)  Flowsheets (Taken 12/30/2021 1243) LTG: Pt will perform toileting task (3/3 steps) with assistance level: Independent with assistive device   Problem: RH Functional Use of Upper Extremity Goal: LTG Patient will use RT/LT upper extremity as a (OT) Description: LTG: Patient will use right/left upper extremity as a stabilizer/gross assist/diminished/nondominant/dominant level with assist, with/without cues during functional activity (OT) Flowsheets (Taken 12/30/2021 1243) LTG: Use  of upper extremity in functional activities: RUE as dominant level LTG: Pt will use upper extremity in functional activity with assistance level of: Independent with assistive device   Problem: RH Simple Meal Prep Goal: LTG Patient will perform simple meal prep w/assist (OT) Description: LTG: Patient will perform simple meal prep with assistance, with/without cues (OT). Flowsheets (Taken 12/30/2021 1243) LTG: Pt will perform simple meal prep with assistance level of: Supervision/Verbal cueing   Problem: RH Toilet Transfers Goal: LTG Patient will perform toilet transfers w/assist (OT) Description: LTG: Patient will perform toilet transfers with assist, with/without cues using equipment (OT) Flowsheets (Taken 12/30/2021 1243) LTG: Pt will perform toilet transfers with assistance level of: Supervision/Verbal cueing   Problem: RH Tub/Shower Transfers Goal: LTG Patient will perform tub/shower transfers w/assist (OT) Description: LTG: Patient will perform tub/shower transfers with assist, with/without cues using equipment (OT) Flowsheets (Taken 12/30/2021 1243) LTG: Pt will perform tub/shower stall transfers with assistance level of: Supervision/Verbal cueing   Problem: RH Furniture Transfers Goal: LTG Patient will perform furniture transfers w/assist (OT/PT) Description: LTG: Patient will perform furniture transfers  with assistance (OT/PT). Flowsheets (Taken 12/30/2021 1243) LTG: Pt will perform furniture transfers with assist:: Supervision/Verbal cueing

## 2021-12-30 NOTE — Plan of Care (Signed)
  Problem: RH Furniture Transfers Goal: LTG Patient will perform furniture transfers w/assist (OT/PT) Description: LTG: Patient will perform furniture transfers  with assistance (OT/PT). Flowsheets (Taken 12/30/2021 1243 by Volanda Napoleon, OT) LTG: Pt will perform furniture transfers with assist:: Supervision/Verbal cueing   Problem: RH Balance Goal: LTG Patient will maintain dynamic sitting balance (PT) Description: LTG:  Patient will maintain dynamic sitting balance with assistance during mobility activities (PT) Flowsheets (Taken 12/30/2021 1843) LTG: Pt will maintain dynamic sitting balance during mobility activities with:: Independent Goal: LTG Patient will maintain dynamic standing balance (PT) Description: LTG:  Patient will maintain dynamic standing balance with assistance during mobility activities (PT) Flowsheets (Taken 12/30/2021 1843) LTG: Pt will maintain dynamic standing balance during mobility activities with:: Supervision/Verbal cueing   Problem: Sit to Stand Goal: LTG:  Patient will perform sit to stand with assistance level (PT) Description: LTG:  Patient will perform sit to stand with assistance level (PT) Flowsheets (Taken 12/30/2021 1843) LTG: PT will perform sit to stand in preparation for functional mobility with assistance level: Independent with assistive device   Problem: RH Bed Mobility Goal: LTG Patient will perform bed mobility with assist (PT) Description: LTG: Patient will perform bed mobility with assistance, with/without cues (PT). Flowsheets (Taken 12/30/2021 1843) LTG: Pt will perform bed mobility with assistance level of: Independent with assistive device    Problem: RH Bed to Chair Transfers Goal: LTG Patient will perform bed/chair transfers w/assist (PT) Description: LTG: Patient will perform bed to chair transfers with assistance (PT). Flowsheets (Taken 12/30/2021 1843) LTG: Pt will perform Bed to Chair Transfers with assistance level:  Supervision/Verbal cueing   Problem: RH Car Transfers Goal: LTG Patient will perform car transfers with assist (PT) Description: LTG: Patient will perform car transfers with assistance (PT). Flowsheets (Taken 12/30/2021 1843) LTG: Pt will perform car transfers with assist:: Contact Guard/Touching assist   Problem: RH Ambulation Goal: LTG Patient will ambulate in controlled environment (PT) Description: LTG: Patient will ambulate in a controlled environment, # of feet with assistance (PT). Flowsheets (Taken 12/30/2021 1843) LTG: Pt will ambulate in controlled environ  assist needed:: Supervision/Verbal cueing LTG: Ambulation distance in controlled environment: at least 200 ft using LRAD Goal: LTG Patient will ambulate in home environment (PT) Description: LTG: Patient will ambulate in home environment, # of feet with assistance (PT). Flowsheets (Taken 12/30/2021 1843) LTG: Pt will ambulate in home environ  assist needed:: Supervision/Verbal cueing LTG: Ambulation distance in home environment: at least 50 ft using LRAD   Problem: RH Stairs Goal: LTG Patient will ambulate up and down stairs w/assist (PT) Description: LTG: Patient will ambulate up and down # of stairs with assistance (PT) Flowsheets (Taken 12/30/2021 1843) LTG: Pt will ambulate up/down stairs assist needed:: Supervision/Verbal cueing LTG: Pt will  ambulate up and down number of stairs: at least 4 steps using HR setup as per family's home environments

## 2021-12-30 NOTE — Progress Notes (Signed)
Notified MD Kirsteins of pt AM lab results. No new orders at this time. Sheela Stack, LPN

## 2021-12-31 DIAGNOSIS — I6381 Other cerebral infarction due to occlusion or stenosis of small artery: Secondary | ICD-10-CM | POA: Diagnosis not present

## 2021-12-31 NOTE — Progress Notes (Signed)
Occupational Therapy Session Note  Patient Details  Name: Mark Jackson. MRN: [id number] Date of Birth: 1929/10/24  Today's Date: 12/31/2021 OT Individual Time: 0701-0800 OT Individual Time Calculation (min): 59 min + 27 min   Short Term Goals: Week 1:  OT Short Term Goal 1 (Week 1): Pt will maintain static sitting balance EOB with S to complete ADL. OT Short Term Goal 2 (Week 1): Pt will don pants with mod A. OT Short Term Goal 3 (Week 1): Pt will complete toilet transfer with mod A and LRAD. OT Short Term Goal 4 (Week 1): Pt will complete standing grooming task at sink with min A.  Skilled Therapeutic Interventions/Progress Updates:    Session 1 ([id number]): Pt received awake semi-reclined in bed, agreeable to therapy. Session focus on self-care retraining, activity tolerance, transfer retraining in prep for improved ADL/IADL/func mobility performance + decreased caregiver burden. Reports having been incontinent of urine. Came to sitting EOB to his R with increased time and CGA overall for balance. Sit to stand with two attempts and ultimately heavy min A to power up and overcome posterior bias from elevated bed height and use of RW. Step pivot to w/c on his L with increased time and heavy min A. Reports L knee pain and LPN later present to apply gel for pain relief.   Pt reports that during the night he got up and walked to toilet himself, educated on importance of having assist for all mobility due to increased fall risk/will bennefit from additional education and LPN made aware. Says he is a "stubborn old man."  Seated in w/c at sink, pt completed pericare in standing with mod A for balance due to increasing R lean, requires multiple attempts to come into standing himself. Donned T shirt and button up shirt with set-up A and increased time. Set-up A for seated grooming. Donned underwear/pants with mod A to thread RLE and pull up in back. Completed oral care in standing with min A for  balance. Noted to rely heavily on BUE to push into standing throughout session and requires frequent seated rest breaks. Short ambulatory transfer min A and increased time > recliner with RW.  Pt left seated in recliner with safety belt alarm engaged, call bell in reach, and all immediate needs met.    Session 2 [phone]): Pt received asleep in recliner with daughter present, agreeable to therapy with encouragement.. Session focus on self-care retraining, activity tolerance, transfer retraining in prep for improved ADL/IADL/func mobility performance + decreased caregiver burden.  Reviewed pt's current status with daughter and anticipated DC needs and plan for family education, daughter verbalizes understanding.   Pt required extensive stimulus to awaken from nap and more than reasonable amount of time to initiate standing due to fatigue, reports need to go to bathroom. Min A to power up from recliner due to posterior bias and ambulated > toilet with CGA to min A. Min physical assist and mod cues for safety how to motor plan turn as pt initially attempting to pull down pants then turn.   Passed off care to LPN as pt needed more time to void.   Therapy Documentation Precautions:  Precautions Precautions: Fall Precaution Comments: R hemiparesis RLE>RUE Restrictions Weight Bearing Restrictions: No  Pain: see session notes   ADL: See Care Tool for more details.   Therapy/Group: Individual Therapy  Mark Napoleon MS, OTR/L  12/31/2021, 6:49 AM

## 2021-12-31 NOTE — Progress Notes (Signed)
Physical Therapy Session Note  Patient Details  Name: Mark Jackson. MRN: [id number] Date of Birth: [birth year]  Today's Date: 12/31/2021 PT Individual Time: 0905-1000 and 1100-1157 PT Individual Time Calculation (min): 55 min and 57 min   Short Term Goals: Week 1:  PT Short Term Goal 1 (Week 1): Pt will perform bed mobility using no bed features with CGA. PT Short Term Goal 2 (Week 1): Pt will perform all functional standing transfers with supervision and no posterior bias. PT Short Term Goal 3 (Week 1): Pt will ambulate 150 feet consistently with CGA/ supervision using LRAD. PT Short Term Goal 4 (Week 1): Pt will ambulate steps using BHR with reciprocating gait pattern and CGA.   Skilled Therapeutic Interventions/Progress Updates:  Sessoin 1   Pt received sitting in recliner and agreeable to PT. PT assisted pt to don shoes and TED hose sitting in recliner.  Pt performed stand pivot transfer with RW and min assist from PT for anterior weight shift and AD management. Pt transported to rehab gym in [facility]. Sit<>stand in parallel bars with min assist from PT for safety.  Forwards reverse gait 2 x 37ft each. Side stepping R and L with BUE support on rails 2x 22ft. Min assist for posture and cues for improved step length throughout on the RLE>LLE.   Dynamic balance training to stand on red wedge x 30sec with BUE support. Pt then completed low difficulty foam block puzzle to build with LUE and take apart with RUE. Crossa body reaching to pickup and move horseshoe to contralateral to ipsilateral side x 9 BUE.   Patient returned to room and performed stand pivot to [facility] over toilet with min assist and rail on wall. Min assist for clothing management . Pt left sitting in on [facility] with RN present with call bell in reach and all needs met.      Session 2   Pt received sitting in recliner and agreeable to PT. Pt performed stand pivot transfer with RW and mod fading to min assist. Pt transported to rehab  gym.   Nustep reciprocal movement training 5 min + 3 min with cues for full ROM with cues for increased ROM and speed. Pt reports mild L knee irritation which subsided when at rest.    Stand pivot transfers throughout session with RW and min assist and cues for improved positioning in WC and anterior weight shift to prevent posterior Bias performed x 5 throughout session. Marland Kitchen   BITS:   Standing in partial semitandem: Visual scanning User paced: 89%, 2.3 sec reaction LUE 80%, 2.1 reaction RUE.  Normal BOS: visual scanning, sequencing: A-Z; 2:45 min, 53% accuracy, 6.3 sec reaction. Number/letter 1-E 100%, 59 sec, 5.9 reaction.    Patient returned to room and performed stand pivot to recliner with min assist as listed with RW. Pt left sitting in recliner with call bell in reach and all needs met.         Therapy Documentation Precautions:  Precautions Precautions: Fall Precaution Comments: R hemiparesis RLE>RUE Restrictions Weight Bearing Restrictions: No  Pain: Session 1 Pain Assessment Pain Scale: 0-10 Pain Score: 0-No pain Session 2  Pain Assessment Pain Scale: 0-10 Pain Score: 0-No pain   Therapy/Group: Individual Therapy  Lorie Phenix 12/31/2021, 11:02 AM

## 2021-12-31 NOTE — Patient Care Conference (Signed)
Inpatient RehabilitationTeam Conference and Plan of Care Update Date: 12/31/2021   Time: 10:12 AM    Patient Name: Mark Jackson.      Medical Record Number: [id number]  Date of Birth: 05-22-1930 Sex: Male         Room/Bed: 4M07C/4M07C-01 Payor Info: Payor: MEDICARE / Plan: MEDICARE PART A AND B / Product Type: *No Product type* /    Admit Date/Time:  12/29/2021  1:33 PM  Primary Diagnosis:  Left thalamic infarction [facility])  Hospital Problems: Principal Problem:   Left thalamic infarction [facility])    Expected Discharge Date: Expected Discharge Date: 01/13/22  Team Members Present: Physician leading conference: Dr. Alysia Penna Social Worker Present: Erlene Quan, BSW Nurse Present: Dorien Chihuahua, RN PT Present: Barrie Folk, PT OT Present: Providence Lanius, OT SLP Present: Sherren Kerns, SLP PPS Coordinator present : Gunnar Fusi, SLP     Current Status/Progress Goal Weekly Team Focus  Bowel/Bladder   cont. of b/b LBM 5/23  remain cont.  assess q shift and prn   Swallow/Nutrition/ Hydration             ADL's   min UB ADL, max LB ADL, very limited by fatigue  S to mod I  R NMR, self-care/balance/transfer retraining, pt/family/AE/DME education   Mobility   Bed mobility = MinA; Transfers = MinA; Gait = MinA  overall supervision  sitting/ standing balance, LLE strengthening/ NMR, improving posterior bias, transfers, gait, family ed   Communication             Safety/Cognition/ Behavioral Observations            Pain   left knee pain  pain <3  assess q shift and prn   Skin   skin intact  no new break down  assess q shift and prn     Discharge Planning:  Discharging home alone. Daughter able to provide 24/7 supervision   Team Discussion: Patient post left cerebellar thalamic infarct. CKD stable and BP managed per MD. Patient presents with fatigue, poor endurance and slow movement with posterior bias and balance issue with mild coordination problems  with the right lower extremity.  Patient on target to meet rehab goals: yes, currently needs set up for upper body care and max assist for lower body care. Needs min - mod assist for stand pivot transfers and min assist for gait. Goals for discharge set for supervision overall and mod I for gait.   *See Care Plan and progress notes for long and short-term goals.   Revisions to Treatment Plan:  N/A   Teaching Needs: Safety, medications, transfers, etc  Current Barriers to Discharge: Decreased caregiver support and Home enviroment access/layout  Possible Resolutions to Barriers: Family education Has own DME     Medical Summary Current Status: BP mildly elevated, no pain c/os  Barriers to Discharge: Medical stability   Possible Resolutions to Barriers/Weekly Focus: anticipate short LOS, working on endurance   Continued Need for Acute Rehabilitation Level of Care: The patient requires daily medical management by a physician with specialized training in physical medicine and rehabilitation for the following reasons: Direction of a multidisciplinary physical rehabilitation program to maximize functional independence : Yes Medical management of patient stability for increased activity during participation in an intensive rehabilitation regime.: Yes Analysis of laboratory values and/or radiology reports with any subsequent need for medication adjustment and/or medical intervention. : Yes   I attest that I was present, lead the team conference, and concur with the  assessment and plan of the team.   [name] 12/31/2021, 3:42 PM
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
## 2021-12-31 NOTE — Progress Notes (Signed)
Patient ID: Mark Jackson., male   DOB: [birth year], 88 y.o.   MRN: [id number]  Team Conference Report to Patient/Family  Team Conference discussion was reviewed with the patient and caregiver, including goals, any changes in plan of care and target discharge date.  Patient and caregiver express understanding and are in agreement.  The patient has a target discharge date of 01/13/22.  Sw spoke with patient daughter, Elmo Putt and provided team conference updates. Sw informed daughter of patient progress in therapy and the focus on fatigue, endurance and balance. Sw informed daughter that intermittent supervision will be required at discharge. No additional or concerns, sw will continue to follow up.   Dyanne Iha 12/31/2021, 1:39 PM

## 2021-12-31 NOTE — Progress Notes (Signed)
PROGRESS NOTE   Subjective/Complaints: , discussed labwork No issues overnite   ROS- neg CP SOB , neg N/V, pt states he can manage urinal ,   Objective:   No results found. Recent Labs    12/30/21 0532  WBC 11.6*  HGB 10.0*  HCT 29.5*  PLT 127*    Recent Labs    12/30/21 0532  NA 138  K 4.4  CL 113*  CO2 20*  GLUCOSE 106*  BUN 57*  CREATININE 2.81*  CALCIUM 8.1*     Intake/Output Summary (Last 24 hours) at 12/31/2021 0800 Last data filed at 12/31/2021 0500 Gross per 24 hour  Intake 720 ml  Output 750 ml  Net -30 ml         Physical Exam: Vital Signs Blood pressure (!) 147/68, pulse 80, temperature 97.9 F (36.6 C), resp. rate 20, height '5\' 8"'$  (1.727 m), weight 83 kg, SpO2 97 %.   General: No acute distress Mood and affect are appropriate Heart: Regular rate and rhythm no rubs murmurs or extra sounds Lungs: Clear to auscultation, breathing unlabored, no rales or wheezes Abdomen: Positive bowel sounds, soft nontender to palpation, nondistended Extremities: No clubbing, cyanosis, or edema Skin: No evidence of breakdown, no evidence of rash Neurologic: Cranial nerves II through XII intact, motor strength is 5/5 in bilateral deltoid, bicep, tricep, grip,3- hip flexor, knee extensors, ankle dorsiflexor and plantar flexor Sensory exam normal sensation to light touch  bilateral upper and lower extremities Cerebellar exam normal finger to nose to finger mild dysmetria L FNF Musculoskeletal: Full range of motion in all 4 extremities. No joint swelling   Assessment/Plan: 1. Functional deficits which require 3+ hours per day of interdisciplinary therapy in a comprehensive inpatient rehab setting. Physiatrist is providing close team supervision and 24 hour management of active medical problems listed below. Physiatrist and rehab team continue to assess barriers to discharge/monitor patient progress toward  functional and medical goals  Care Tool:  Bathing    Body parts bathed by patient: Face, Right arm, Left arm, Chest, Front perineal area, Buttocks         Bathing assist Assist Level: Moderate Assistance - Patient 50 - 74%     Upper Body Dressing/Undressing Upper body dressing   What is the patient wearing?: Button up shirt    Upper body assist Assist Level: Minimal Assistance - Patient > 75%    Lower Body Dressing/Undressing Lower body dressing      What is the patient wearing?: Pants, Underwear/pull up     Lower body assist Assist for lower body dressing: Maximal Assistance - Patient 25 - 49%     Toileting Toileting    Toileting assist Assist for toileting: Set up assist Assistive Device Comment: urinal bed level   Transfers Chair/bed transfer  Transfers assist  Chair/bed transfer activity did not occur: N/A  Chair/bed transfer assist level: Minimal Assistance - Patient > 75%     Locomotion Ambulation   Ambulation assist      Assist level: Minimal Assistance - Patient > 75% Assistive device: Walker-rolling Max distance: 43 ft   Walk 10 feet activity   Assist     Assist level:  Minimal Assistance - Patient > 75% Assistive device: Walker-rolling   Walk 50 feet activity   Assist Walk 50 feet with 2 turns activity did not occur: Safety/medical concerns         Walk 150 feet activity   Assist Walk 150 feet activity did not occur: Safety/medical concerns         Walk 10 feet on uneven surface  activity   Assist Walk 10 feet on uneven surfaces activity did not occur: Safety/medical concerns         Wheelchair     Assist Is the patient using a wheelchair?: No Type of Wheelchair: Manual    Wheelchair assist level: Total Assistance - Patient < 25% Max wheelchair distance: 250 ft    Wheelchair 50 feet with 2 turns activity    Assist        Assist Level: Total Assistance - Patient < 25%   Wheelchair 150 feet  activity     Assist      Assist Level: Total Assistance - Patient < 25%   Blood pressure (!) 147/68, pulse 80, temperature 97.9 F (36.6 C), resp. rate 20, height '5\' 8"'$  (1.727 m), weight 83 kg, SpO2 97 %.  Medical Problem List and Plan: 1. Functional deficits secondary to left thalamic infarction and small left cerebellar infarct             -patient may shower             -ELOS/Goals: 10-12 days, mod I to supervision goals initial evals today  2.  Antithrombotics: -DVT/anticoagulation:  Pharmaceutical: Other (comment) Eliquis             -antiplatelet therapy: N/A 3. Pain Management: Tylenol as needed             -add voltaren gel QID for endstage OA left knee 4. Mood: Provide emotional support             -antipsychotic agents: N/A 5. Neuropsych: This patient is capable of making decisions on his own behalf. 6. Skin/Wound Care: Routine skin checks 7. Fluids/Electrolytes/Nutrition: Routine in and outs with follow-up chemistries 8.  Atrial fibrillation.  Cardizem 120 mg daily.  Continue Eliquis.  Cardiac rate controlled at present Vitals:   12/30/21 1943 12/31/21 0450  BP: 134/73 (!) 147/68  Pulse: 80 80  Resp: 14 20  Temp: 99.1 F (37.3 C) 97.9 F (36.6 C)  SpO2: 99% 97%    9.  Hyperlipidemia.  Crestor 10.  Diastolic congestive heart failure.  Lasix 20 mg daily.  Monitor for any signs of fluid overload             -daily weights Filed Weights   12/29/21 1338  Weight: 83 kg    11.  CAD/stenting/TAVR.  Continue Imdur 60 mg daily 12.  CKD stage IV.  Check CMET tomorrow    Latest Ref Rng & Units 12/30/2021    5:32 AM 12/28/2021    1:52 AM 12/26/2021    7:02 PM  BMP  Glucose 70 - 99 mg/dL 106   105   99    BUN 8 - 23 mg/dL 57   56   61    Creatinine 0.61 - 1.24 mg/dL 2.81   2.69   2.88    Sodium 135 - 145 mmol/L 138   141   141    Potassium 3.5 - 5.1 mmol/L 4.4   4.4   4.3    Chloride 98 - 111 mmol/L 113  116   115    CO2 22 - 32 mmol/L '20   20   18    '$ Calcium  8.9 - 10.3 mg/dL 8.1   8.3   8.6      13.  History of migraine headaches.  Topamax 150 mg nightly 14.  Chronic anemia.  F/u cbc on admit 15.  BPH with history of prostate cancer.  Check PVR and follow for patterns. Pt denies any emptying issues at present    LOS: 2 days A FACE TO FACE EVALUATION WAS PERFORMED  Charlett Blake 12/31/2021, 8:00 AM

## 2022-01-01 DIAGNOSIS — I6381 Other cerebral infarction due to occlusion or stenosis of small artery: Secondary | ICD-10-CM | POA: Diagnosis not present

## 2022-01-01 MED ORDER — ROSUVASTATIN CALCIUM 5 MG PO TABS
10.0000 mg | ORAL_TABLET | Freq: Every day | ORAL | Status: DC
  Administered 2022-01-01 – 2022-01-13 (×13): 10 mg via ORAL
  Filled 2022-01-01 (×11): qty 2

## 2022-01-01 NOTE — Progress Notes (Signed)
Physical Therapy Session Note  Patient Details  Name: Mark Jackson. MRN: [id number] Date of Birth: [birth year]  Today's Date: 01/01/2022 PT Individual Time: 0920-1015 PT Individual Time Calculation (min): 55 min   Short Term Goals: Week 1:  PT Short Term Goal 1 (Week 1): Pt will perform bed mobility using no bed features with CGA. PT Short Term Goal 2 (Week 1): Pt will perform all functional standing transfers with supervision and no posterior bias. PT Short Term Goal 3 (Week 1): Pt will ambulate 150 feet consistently with CGA/ supervision using LRAD. PT Short Term Goal 4 (Week 1): Pt will ambulate steps using BHR with reciprocating gait pattern and CGA.   Skilled Therapeutic Interventions/Progress Updates:   Pt received sitting in recliner and agreeable to PT. PT assist to don ted hose with max assist from PT. Pt performed stand pivot transfer with RW and min from PT for safety to [place].   Pt transported to rehab gym. Gait training performed with RW x 73f with min assist for safety and cues for step length and posture throughout. Dynamic gait training forward reverse 2 x 132fwith RW and min assist for posture and cues for weight shift to the L to improve step height on the RLE.  Dynamic balance training to perform lateral reach to obtain card from velcro board then place on table with cross body reach. Performed x 8 each direciton, Bil. Min assist from PT to improve L weight shift and improve awareness of LOB to the R/posterior intermittently.   WC mobility x 1002fith supervision assist and cues for symmetry r and L to prevent veer to the R.   Nustep reciprocal movement training x 10 min level 4 with cues for intermittently for full ROM on RLE.   Patient returned to room and performed [facility] min assist and RW. Pt left sitting on BSC with NT present, call bell in reach and all needs met.       Therapy Documentation Precautions:   Precautions Precautions: Fall Precaution Comments: R hemiparesis RLE>RUE Restrictions Weight Bearing Restrictions: No    Pain: Pain Assessment Pain Scale: 0-10 Pain Score: 0-No pain     Therapy/Group: Individual Therapy  AusLorie Phenix25/2023, 10:24 AM

## 2022-01-01 NOTE — IPOC Note (Signed)
Overall Plan of Care [facility]) Patient Details Name: Mark Jackson. MRN: [id number] DOB: June 13, 1930  Admitting Diagnosis: Left thalamic infarction [facility])  Hospital Problems: Principal Problem:   Left thalamic infarction [facility])     Functional Problem List: Nursing Medication Management, Safety  PT Balance, Edema, Endurance, Motor, Nutrition, Pain, Safety, Sensory  OT Balance, Cognition, Endurance, Motor  SLP    TR         Basic ADL's: OT Grooming, Bathing, Dressing, Toileting     Advanced  ADL's: OT Simple Meal Preparation     Transfers: PT Bed Mobility, Bed to Chair, Car, Manufacturing systems engineer, Metallurgist: PT Ambulation, Stairs     Additional Impairments: OT Fuctional Use of Upper Extremity  SLP        TR      Anticipated Outcomes Item Anticipated Outcome  Self Feeding ind  Swallowing      Basic self-care  mod I to S  Toileting  mod I   Bathroom Transfers S  Bowel/Bladder  n/a  Transfers  Mod I/ SUP  Locomotion  SUP  Communication     Cognition     Pain  n/a  Safety/Judgment  maintain w cues   Therapy Plan: PT Intensity: Minimum of 1-2 x/day ,45 to 90 minutes PT Frequency: 5 out of 7 days PT Duration Estimated Length of Stay: 10-12 days OT Intensity: Minimum of 1-2 x/day, 45 to 90 minutes OT Frequency: 5 out of 7 days OT Duration/Estimated Length of Stay: 2-2.5 weeks     Team Interventions: Nursing Interventions Disease Management/Prevention, Medication Management, Discharge Planning, Patient/Family Education  PT interventions Ambulation/gait training, Community reintegration, DME/adaptive equipment instruction, Neuromuscular re-education, Psychosocial support, Stair training, UE/LE Strength taining/ROM, UE/LE Coordination activities, Therapeutic Activities, Pain management, Functional electrical stimulation, Discharge planning, Training and development officer, Cognitive remediation/compensation, Disease management/prevention,  Functional mobility training, Patient/family education, Splinting/orthotics, Therapeutic Exercise, Visual/perceptual remediation/compensation  OT Interventions Balance/vestibular training, Disease mangement/prevention, Neuromuscular re-education, Self Care/advanced ADL retraining, Therapeutic Exercise, Wheelchair propulsion/positioning, UE/LE Strength taining/ROM, Skin care/wound managment, Pain management, DME/adaptive equipment instruction, Cognitive remediation/compensation, Community reintegration, Barrister's clerk education, UE/LE Coordination activities, Therapeutic Activities, Psychosocial support, Functional mobility training, Discharge planning  SLP Interventions    TR Interventions    SW/CM Interventions Discharge Planning, Psychosocial Support, Patient/Family Education, Disease Management/Prevention   Barriers to Discharge MD  Medical stability  Nursing Lack of/limited family support, Home environment access/layout 2 level ramped entry, main B+B solo, dtr and family to check in  PT Home environment Child psychotherapist, Insurance underwriter for SNF coverage, Lack of/limited family support, Nutrition means, Decreased caregiver support    OT Decreased caregiver support, Home environment Child psychotherapist, Inaccessible home environment    SLP      SW Lack of/limited family support, Decreased caregiver support     Team Discharge Planning: Destination: PT-Home ,OT- Home , SLP-  Projected Follow-up: PT-Outpatient PT, 24 hour supervision/assistance, OT-  Outpatient OT, Home health OT, SLP-  Projected Equipment Needs: PT-To be determined, OT- To be determined, SLP-  Equipment Details: PT- , OT-  Patient/family involved in discharge planning: PT- Patient,  OT-Patient, SLP-   MD ELOS: 7-10d Medical Rehab Prognosis:  Good Assessment: The patient has been admitted for CIR therapies with the diagnosis of Left thalamic infarct . The team will be addressing functional mobility, strength, stamina, balance, safety,  adaptive techniques and equipment, self-care, bowel and bladder mgt, patient and caregiver education, higher level cognition for return to work . Goals have been set at Mod I/SUp.  Anticipated discharge destination is home with daughter to assist.        See Team Conference Notes for weekly updates to the plan of care

## 2022-01-01 NOTE — Progress Notes (Signed)
Occupational Therapy Session Note  Patient Details  Name: Mark Jackson. MRN: [id number] Date of Birth: Nov 15, 1929  Today's Date: 01/01/2022 OT Individual Time: 0932-3557 OT Individual Time Calculation (min): 76 min    Short Term Goals: Week 1:  OT Short Term Goal 1 (Week 1): Pt will maintain static sitting balance EOB with S to complete ADL. OT Short Term Goal 2 (Week 1): Pt will don pants with mod A. OT Short Term Goal 3 (Week 1): Pt will complete toilet transfer with mod A and LRAD. OT Short Term Goal 4 (Week 1): Pt will complete standing grooming task at sink with min A.  Skilled Therapeutic Interventions/Progress Updates:    Pt received asleep in bed but easily awakened to voice, agreeable to therapy. Session focus on self-care retraining, activity tolerance, transfer retraining in prep for improved ADL/IADL/func mobility performance + decreased caregiver burden.  Did require significant amount of time to rouse/come into sitting EOB on his R due to fatigue with CGA and heavy use of bed features and use of RW for balance.  Stand-pivot with min A to power up from elevated height bed with RW > w/c. Seated in w/c at sink, completed seated UB bathing/dressing/grooming with distant S to mod I, but did require increased time due to fatigue. Completed LB bathing and dressing at sink with overall min to mod A for balance  due to R lean when bending forward. Pt able to thread RLE himself this date via figure 4 position and with increased time, motivated to complete tasks himself.  Ambulated > recliner with overall min A due to RLE hemiparesis, cues for bigger step and to stay inside RW frame, complete turn prior to sitting + RW.  Pt left seated in recliner with LPN present, safety belt alarm engaged, call bell in reach, and all immediate needs met.    Therapy Documentation Precautions:  Precautions Precautions: Fall Precaution Comments: R hemiparesis RLE>RUE Restrictions Weight  Bearing Restrictions: No  Pain: L knee "still touchy"   ADL: See Care Tool for more details.   Therapy/Group: Individual Therapy  Volanda Napoleon MS, OTR/L  01/01/2022, 6:54 AM

## 2022-01-01 NOTE — Progress Notes (Signed)
PROGRESS NOTE   Subjective/Complaints:  Discussed d/c date with pt Pt without new issues, still has left knee pain which also bothered him at home partial relief with diclofenac gel and tylenol   ROS- neg CP SOB , neg N/V, pt states he can manage urinal ,   Objective:   No results found. Recent Labs    12/30/21 0532  WBC 11.6*  HGB 10.0*  HCT 29.5*  PLT 127*    Recent Labs    12/30/21 0532  NA 138  K 4.4  CL 113*  CO2 20*  GLUCOSE 106*  BUN 57*  CREATININE 2.81*  CALCIUM 8.1*     Intake/Output Summary (Last 24 hours) at 01/01/2022 0741 Last data filed at 01/01/2022 0200 Gross per 24 hour  Intake 720 ml  Output 400 ml  Net 320 ml         Physical Exam: Vital Signs Blood pressure 127/60, pulse 79, temperature 98.4 F (36.9 C), temperature source Oral, resp. rate 18, height '5\' 8"'$  (1.727 m), weight 83 kg, SpO2 96 %.   General: No acute distress Mood and affect are appropriate Heart: Regular rate and rhythm no rubs murmurs or extra sounds Lungs: Clear to auscultation, breathing unlabored, no rales or wheezes Abdomen: Positive bowel sounds, soft nontender to palpation, nondistended Extremities: No clubbing, cyanosis, or edema Skin: No evidence of breakdown, no evidence of rash Neurologic: Cranial nerves II through XII intact, motor strength is 5/5 in bilateral deltoid, bicep, tricep, grip,3- hip flexor, knee extensors, ankle dorsiflexor and plantar flexor Sensory exam normal sensation to light touch  bilateral upper and lower extremities Cerebellar exam normal finger to nose to finger mild dysmetria L FNF Musculoskeletal: Full range of motion in all 4 extremities. No joint swelling   Assessment/Plan: 1. Functional deficits which require 3+ hours per day of interdisciplinary therapy in a comprehensive inpatient rehab setting. Physiatrist is providing close team supervision and 24 hour management of  active medical problems listed below. Physiatrist and rehab team continue to assess barriers to discharge/monitor patient progress toward functional and medical goals  Care Tool:  Bathing    Body parts bathed by patient: Face, Right arm, Left arm, Chest, Front perineal area, Buttocks         Bathing assist Assist Level: Moderate Assistance - Patient 50 - 74%     Upper Body Dressing/Undressing Upper body dressing   What is the patient wearing?: Button up shirt    Upper body assist Assist Level: Minimal Assistance - Patient > 75%    Lower Body Dressing/Undressing Lower body dressing      What is the patient wearing?: Pants, Underwear/pull up     Lower body assist Assist for lower body dressing: Maximal Assistance - Patient 25 - 49%     Toileting Toileting    Toileting assist Assist for toileting: Set up assist Assistive Device Comment: urinal bed level   Transfers Chair/bed transfer  Transfers assist  Chair/bed transfer activity did not occur: N/A  Chair/bed transfer assist level: Minimal Assistance - Patient > 75%     Locomotion Ambulation   Ambulation assist      Assist level: Minimal Assistance - Patient >  75% Assistive device: Walker-rolling Max distance: 43 ft   Walk 10 feet activity   Assist     Assist level: Minimal Assistance - Patient > 75% Assistive device: Walker-rolling   Walk 50 feet activity   Assist Walk 50 feet with 2 turns activity did not occur: Safety/medical concerns         Walk 150 feet activity   Assist Walk 150 feet activity did not occur: Safety/medical concerns         Walk 10 feet on uneven surface  activity   Assist Walk 10 feet on uneven surfaces activity did not occur: Safety/medical concerns         Wheelchair     Assist Is the patient using a wheelchair?: No Type of Wheelchair: Manual    Wheelchair assist level: Total Assistance - Patient < 25% Max wheelchair distance: 250 ft     Wheelchair 50 feet with 2 turns activity    Assist        Assist Level: Total Assistance - Patient < 25%   Wheelchair 150 feet activity     Assist      Assist Level: Total Assistance - Patient < 25%   Blood pressure 127/60, pulse 79, temperature 98.4 F (36.9 C), temperature source Oral, resp. rate 18, height '5\' 8"'$  (1.727 m), weight 83 kg, SpO2 96 %.  Medical Problem List and Plan:likely cardioembolic  1. Functional deficits secondary to left thalamic infarction and small left cerebellar infarct             -patient may shower             -ELOS/Goals: 01/13/22  mod I to supervision goals initial evals today -discussed with pt  2.  Antithrombotics: -DVT/anticoagulation:  Pharmaceutical: Other (comment) Eliquis             -antiplatelet therapy: N/A 3. Pain Management: Tylenol as needed             -add voltaren gel QID for endstage OA left knee 4. Mood: Provide emotional support             -antipsychotic agents: N/A 5. Neuropsych: This patient is capable of making decisions on his own behalf. 6. Skin/Wound Care: Routine skin checks 7. Fluids/Electrolytes/Nutrition: Routine in and outs with follow-up chemistries 8.  Atrial fibrillation.  Cardizem 120 mg daily.  Continue Eliquis.  Cardiac rate controlled at present Vitals:   12/31/21 2050 01/01/22 0445  BP: 126/64 127/60  Pulse: 76 79  Resp: 18 18  Temp: 98.5 F (36.9 C) 98.4 F (36.9 C)  SpO2: 99% 96%    9.  Hyperlipidemia.  Crestor 10.  Diastolic congestive heart failure.  Lasix 20 mg daily.  Monitor for any signs of fluid overload             -daily weights Filed Weights   12/29/21 1338  Weight: 83 kg    11.  CAD/stenting/TAVR.  Continue Imdur 60 mg daily 12.  CKD stage IV.  Check CMET tomorrow    Latest Ref Rng & Units 12/30/2021    5:32 AM 12/28/2021    1:52 AM 12/26/2021    7:02 PM  BMP  Glucose 70 - 99 mg/dL 106   105   99    BUN 8 - 23 mg/dL 57   56   61    Creatinine 0.61 - 1.24 mg/dL 2.81    2.69   2.88    Sodium 135 - 145 mmol/L 138  141   141    Potassium 3.5 - 5.1 mmol/L 4.4   4.4   4.3    Chloride 98 - 111 mmol/L 113   116   115    CO2 22 - 32 mmol/L '20   20   18    '$ Calcium 8.9 - 10.3 mg/dL 8.1   8.3   8.6      13.  History of migraine headaches.  Topamax 150 mg nightly 14.  Chronic anemia.      Latest Ref Rng & Units 12/30/2021    5:32 AM 12/28/2021    1:52 AM 12/26/2021    7:02 PM  CBC  WBC 4.0 - 10.5 K/uL 11.6   10.3   11.4    Hemoglobin 13.0 - 17.0 g/dL 10.0   9.9   11.8    Hematocrit 39.0 - 52.0 % 29.5   30.8   37.0    Platelets 150 - 400 K/uL 127   120   126      15.  BPH with history of prostate cancer.  Check PVR and follow for patterns. Pt denies any emptying issues at present    LOS: 3 days A FACE TO FACE EVALUATION WAS PERFORMED  Charlett Blake 01/01/2022, 7:41 AM

## 2022-01-02 DIAGNOSIS — I6381 Other cerebral infarction due to occlusion or stenosis of small artery: Secondary | ICD-10-CM | POA: Diagnosis not present

## 2022-01-02 NOTE — Progress Notes (Signed)
PROGRESS NOTE   Subjective/Complaints:  Left knee pain doing ok, per OT doing well with ADLs, reviewed d/c date 01/13/22  ROS- neg CP SOB , neg N/V, pt states he can manage urinal ,   Objective:   No results found. No results for input(s): WBC, HGB, HCT, PLT in the last 72 hours.  No results for input(s): NA, K, CL, CO2, GLUCOSE, BUN, CREATININE, CALCIUM in the last 72 hours.   Intake/Output Summary (Last 24 hours) at 01/02/2022 0741 Last data filed at 01/02/2022 0600 Gross per 24 hour  Intake 960 ml  Output 800 ml  Net 160 ml         Physical Exam: Vital Signs Blood pressure 136/62, pulse 89, temperature 97.6 F (36.4 C), temperature source Oral, resp. rate 18, height '5\' 8"'$  (1.727 m), weight 83 kg, SpO2 99 %.   General: No acute distress Mood and affect are appropriate Heart: Regular rate and rhythm no rubs murmurs or extra sounds Lungs: Clear to auscultation, breathing unlabored, no rales or wheezes Abdomen: Positive bowel sounds, soft nontender to palpation, nondistended Extremities: No clubbing, cyanosis, or edema Skin: No evidence of breakdown, no evidence of rash Neurologic: Cranial nerves II through XII intact, motor strength is 5/5 in bilateral deltoid, bicep, tricep, grip,3- hip flexor, knee extensors, ankle dorsiflexor and plantar flexor Sensory exam normal sensation to light touch  bilateral upper and lower extremities Cerebellar exam normal finger to nose to finger mild dysmetria L FNF Musculoskeletal: Full range of motion in all 4 extremities. No joint swelling   Assessment/Plan: 1. Functional deficits which require 3+ hours per day of interdisciplinary therapy in a comprehensive inpatient rehab setting. Physiatrist is providing close team supervision and 24 hour management of active medical problems listed below. Physiatrist and rehab team continue to assess barriers to discharge/monitor patient  progress toward functional and medical goals  Care Tool:  Bathing    Body parts bathed by patient: Face, Right arm, Left arm, Chest, Front perineal area, Buttocks         Bathing assist Assist Level: Maximal Assistance - Patient 24 - 49%     Upper Body Dressing/Undressing Upper body dressing   What is the patient wearing?: Button up shirt, Pull over shirt    Upper body assist Assist Level: Set up assist    Lower Body Dressing/Undressing Lower body dressing      What is the patient wearing?: Pants, Underwear/pull up     Lower body assist Assist for lower body dressing: Minimal Assistance - Patient > 75%     Toileting Toileting    Toileting assist Assist for toileting: Minimal Assistance - Patient > 75% Assistive Device Comment: RW   Transfers Chair/bed transfer  Transfers assist     Chair/bed transfer assist level: Minimal Assistance - Patient > 75%     Locomotion Ambulation   Ambulation assist      Assist level: Minimal Assistance - Patient > 75% Assistive device: Walker-rolling Max distance: 43 ft   Walk 10 feet activity   Assist     Assist level: Minimal Assistance - Patient > 75% Assistive device: Walker-rolling   Walk 50 feet activity   Assist  Walk 50 feet with 2 turns activity did not occur: Safety/medical concerns         Walk 150 feet activity   Assist Walk 150 feet activity did not occur: Safety/medical concerns         Walk 10 feet on uneven surface  activity   Assist Walk 10 feet on uneven surfaces activity did not occur: Safety/medical concerns         Wheelchair     Assist Is the patient using a wheelchair?: Yes Type of Wheelchair: Manual    Wheelchair assist level: Total Assistance - Patient < 25% Max wheelchair distance: 250 ft    Wheelchair 50 feet with 2 turns activity    Assist        Assist Level: Total Assistance - Patient < 25%   Wheelchair 150 feet activity     Assist       Assist Level: Total Assistance - Patient < 25%   Blood pressure 136/62, pulse 89, temperature 97.6 F (36.4 C), temperature source Oral, resp. rate 18, height '5\' 8"'$  (1.727 m), weight 83 kg, SpO2 99 %.  Medical Problem List and Plan:likely cardioembolic  1. Functional deficits secondary to left thalamic infarction and small left cerebellar infarct             -patient may shower             -ELOS/Goals: 01/13/22  mod I to supervision goals initial evals today -discussed with pt  2.  Antithrombotics: -DVT/anticoagulation:  Pharmaceutical: Other (comment) Eliquis             -antiplatelet therapy: N/A 3. Pain Management: Tylenol as needed             -add voltaren gel QID for endstage OA left knee 4. Mood: Provide emotional support             -antipsychotic agents: N/A 5. Neuropsych: This patient is capable of making decisions on his own behalf. 6. Skin/Wound Care: Routine skin checks 7. Fluids/Electrolytes/Nutrition: Routine in and outs with follow-up chemistries 8.  Atrial fibrillation.  Cardizem 120 mg daily.  Continue Eliquis.  Cardiac rate controlled at present Vitals:   01/01/22 2033 01/02/22 0455  BP: (!) 152/66 136/62  Pulse: 78 89  Resp: 18 18  Temp: 98.6 F (37 C) 97.6 F (36.4 C)  SpO2: 100% 99%   Some lability but will keep on current meds unless HR or BP start to climb  9.  Hyperlipidemia.  Crestor 10.  Diastolic congestive heart failure.  Lasix 20 mg daily.  Monitor for any signs of fluid overload             -daily weights Filed Weights   12/29/21 1338  Weight: 83 kg    11.  CAD/stenting/TAVR.  Continue Imdur 60 mg daily 12.  CKD stage IV.  Check CMET tomorrow    Latest Ref Rng & Units 12/30/2021    5:32 AM 12/28/2021    1:52 AM 12/26/2021    7:02 PM  BMP  Glucose 70 - 99 mg/dL 106   105   99    BUN 8 - 23 mg/dL 57   56   61    Creatinine 0.61 - 1.24 mg/dL 2.81   2.69   2.88    Sodium 135 - 145 mmol/L 138   141   141    Potassium 3.5 - 5.1 mmol/L 4.4    4.4   4.3    Chloride 98 -  111 mmol/L 113   116   115    CO2 22 - 32 mmol/L '20   20   18    '$ Calcium 8.9 - 10.3 mg/dL 8.1   8.3   8.6      13.  History of migraine headaches.  Topamax 150 mg nightly 14.  Chronic anemia.      Latest Ref Rng & Units 12/30/2021    5:32 AM 12/28/2021    1:52 AM 12/26/2021    7:02 PM  CBC  WBC 4.0 - 10.5 K/uL 11.6   10.3   11.4    Hemoglobin 13.0 - 17.0 g/dL 10.0   9.9   11.8    Hematocrit 39.0 - 52.0 % 29.5   30.8   37.0    Platelets 150 - 400 K/uL 127   120   126      15.  BPH with history of prostate cancer.  Check PVR and follow for patterns. Pt denies any emptying issues at present    LOS: 4 days A FACE TO FACE EVALUATION WAS PERFORMED  Mark Jackson 01/02/2022, 7:41 AM

## 2022-01-02 NOTE — Plan of Care (Signed)
  Problem: Consults Goal: RH STROKE PATIENT EDUCATION Description: See Patient Education module for education specifics  Outcome: Progressing   Problem: RH SAFETY Goal: RH STG ADHERE TO SAFETY PRECAUTIONS W/ASSISTANCE/DEVICE Description: STG Adhere to Safety Precautions With cues Assistance/Device. Outcome: Progressing   Problem: RH KNOWLEDGE DEFICIT Goal: RH STG INCREASE KNOWLEDGE OF HYPERTENSION Description: Patient and family will be able to manage HTN with medications and dietary modifications using handouts and educational resources independently Outcome: Progressing Goal: RH STG INCREASE KNOWLEGDE OF HYPERLIPIDEMIA Description: Patient and family will be able to manage HLD with medications and dietary modifications using handouts and educational resources independently Outcome: Progressing Goal: RH STG INCREASE KNOWLEDGE OF STROKE PROPHYLAXIS Description: Patient and family will be able to manage secondary risks with medications and dietary modifications using handouts and educational resources independently Outcome: Progressing

## 2022-01-02 NOTE — Progress Notes (Signed)
Physical Therapy Session Note  Patient Details  Name: Mark Jackson. MRN: [id number] Date of Birth: 1929-08-23  Today's Date: 01/01/2022      Short Term Goals: Week 1:  PT Short Term Goal 1 (Week 1): Pt will perform bed mobility using no bed features with CGA. PT Short Term Goal 2 (Week 1): Pt will perform all functional standing transfers with supervision and no posterior bias. PT Short Term Goal 3 (Week 1): Pt will ambulate 150 feet consistently with CGA/ supervision using LRAD. PT Short Term Goal 4 (Week 1): Pt will ambulate steps using BHR with reciprocating gait pattern and CGA.   Skilled Therapeutic Interventions/Progress Updates:   Pt received supine in recliner , asleep. Pt unable to arouse. PT returned in 37minutes and pt requests to rest remainder of day.left in recliner. All needs met.       Therapy Documentation Precautions:  Precautions Precautions: Fall Precaution Comments: R hemiparesis RLE>RUE Restrictions Weight Bearing Restrictions: No General:   Missed time 75 min : fatigue. Vital Signs: Therapy Vitals Temp: 97.6 F (36.4 C) Temp Source: Oral Pulse Rate: 89 Resp: 18 BP: 136/62 Patient Position (if appropriate): Lying Oxygen Therapy SpO2: 99 % O2 Device: Room Air   Therapy/Group: Individual Therapy  Lorie Phenix 01/02/2022, 7:48 AM

## 2022-01-02 NOTE — Progress Notes (Signed)
Occupational Therapy Session Note  Patient Details  Name: Mark Jackson. MRN: [id number] Date of Birth: Dec 08, 1929  Today's Date: 01/02/2022 OT Individual Time: 5621-3086 OT Individual Time Calculation (min): 72 min    Short Term Goals: Week 1:  OT Short Term Goal 1 (Week 1): Pt will maintain static sitting balance EOB with S to complete ADL. OT Short Term Goal 2 (Week 1): Pt will don pants with mod A. OT Short Term Goal 3 (Week 1): Pt will complete toilet transfer with mod A and LRAD. OT Short Term Goal 4 (Week 1): Pt will complete standing grooming task at sink with min A.  Skilled Therapeutic Interventions/Progress Updates:    Pt received semi-reclined in bed finishing breakfast, endorses mild R groin and chronic L knee pain but, agreeable to therapy. Came to sitting EOB with overall CGA, and cues for technique / use of bed rail. Continues to require significant amount of time, but improving in speed/ease.   Came into standing min A to overcome posterior bias from elevated EOB, stood to use urinal and complete pericare with min A for balance. Completed LBD with increased time and min to light mod A for balance in standing, able to thread BLE into pants with increased time and figure 4 technique. Close S for UBD and seated grooming at EOB for balance. MD in/out morning rounds.  Total A to don B teds and shoes. Short ambulatory transfer > recliner, multiple attempts to come into standing due to difficulty without having arm rests to push up from, but required ultimately min A, cues for upright posture and step length on R.   Pt left seated in recliner with safety belt alarm engaged, call bell in reach, and all immediate needs met. NT present.   Therapy Documentation Precautions:  Precautions Precautions: Fall Precaution Comments: R hemiparesis RLE>RUE Restrictions Weight Bearing Restrictions: No  Pain:   See above ADL: See Care Tool for more details.   Therapy/Group:  Individual Therapy  Volanda Napoleon MS, OTR/L  01/02/2022, 6:53 AM

## 2022-01-02 NOTE — Progress Notes (Signed)
Physical Therapy Session Note  Patient Details  Name: Mark Jackson. MRN: [id number] Date of Birth: 10/08/1929  Today's Date: 01/02/2022 PT Individual Time: 4715-9539 and 6728-9791 PT Individual Time Calculation (min): 70 min and 43 min   Short Term Goals: Week 1:  PT Short Term Goal 1 (Week 1): Pt will perform bed mobility using no bed features with CGA. PT Short Term Goal 2 (Week 1): Pt will perform all functional standing transfers with supervision and no posterior bias. PT Short Term Goal 3 (Week 1): Pt will ambulate 150 feet consistently with CGA/ supervision using LRAD. PT Short Term Goal 4 (Week 1): Pt will ambulate steps using BHR with reciprocating gait pattern and CGA.   Skilled Therapeutic Interventions/Progress Updates:   Session 1 Pt received sitting in WC and agreeable to PT  Pt transported to rehab gym in [facility]. Sit<>stand transfers from [facility] with supervision assist and min assist from mat tables x 2.   Gait training with RW 2 x 67ft with min assist and cues for posture. Mild foot drag on R with fatigue.   BLE strengthening/NMR in parallel bars.  Mini squats: hip abduction. Anterior foot taps on 6 inch step 2 x 6 bil, lateral foot taps on 4 inch step. Foot tap on colored dots and colored cones.    Nustep reciprocal movement training x 8 minutes with cues for full ROM into R knee extension throughout. Level 4, pt rates Borg RPE 12/20.   Patient returned to room and performed stand pivot to recliner with min assist and RW. Pt left sitting in recliner with call bell in reach and all needs met.    Session 2.   Pt received sitting in recliner and agreeable to PT. Pt performed stand pivot transfer with RW and min from PT for safety.   Dynamic balance training in parallel bars to step over hockey stick with emphasis on appropriate weight shift forward x 12 and lateral 2 x 8.   Gait training In parallel bars to step over 3 1 inch obstacles forward and reverse with min  assist. Gait in hall x 138ft and 171ft with CGA-min assist due to foot drag on the RLE with fatigue on second bout. Cues for posture, weight shift, and improved heel contact on the R.   Patient returned to room and performed stand pivot to recliner with RW and CGA. Pt left sitting in recliner with call bell in reach and all needs met.         Therapy Documentation Precautions:  Precautions Precautions: Fall Precaution Comments: R hemiparesis RLE>RUE Restrictions Weight Bearing Restrictions: No    Pain:  Denies for session 1 and 2     Therapy/Group: Individual Therapy  Lorie Phenix 01/02/2022, 11:21 AM

## 2022-01-02 NOTE — Progress Notes (Signed)
Occupational Therapy Session Note  Patient Details  Name: Mark Jackson. MRN: [id number] Date of Birth: 03/02/1930  Today's Date: 01/02/2022 OT Individual Time: 9242-6834 OT Individual Time Calculation (min): 42 min    Short Term Goals: Week 1:  OT Short Term Goal 1 (Week 1): Pt will maintain static sitting balance EOB with S to complete ADL. OT Short Term Goal 2 (Week 1): Pt will don pants with mod A. OT Short Term Goal 3 (Week 1): Pt will complete toilet transfer with mod A and LRAD. OT Short Term Goal 4 (Week 1): Pt will complete standing grooming task at sink with min A.  Skilled Therapeutic Interventions/Progress Updates: Patient received sitting up in recliner. Agreeable to OT treatment. Patient without report of pain. Patient ambulated with rolling walker to therapy gym to work on BUE strength and coordination tasks working to improve RUE movements. Patient ambulated with Min assist and occasional pauses to get RLE even with the LLE and to improve midline posture. Once in the therapy gym worked with 2 lb dowel in sets of 10 working to keep movements smooth and even between BUE's in varied planes. Patient needed occasional cues to reach proper AROM and prevent right side from slowly lowering. Continued endurance tasks walking to UBE to propel in standing 5 minutes. Patient requested to be rolled by w/c back to room due to fatigue. Assisted into the bathroom upon return with min assist to transfer and assist to undo the button on his pants. Continue with skilled OT POC to help patient regain independence with self care and reduce fall risk.     Therapy Documentation Precautions:  Precautions Precautions: Fall Precaution Comments: R hemiparesis RLE>RUE Restrictions Weight Bearing Restrictions: No    Pain:No c/o   ADL: ADL Eating: Set up Where Assessed-Eating: Bed level Grooming: Minimal assistance Where Assessed-Grooming: Edge of bed Upper Body Bathing: Minimal  assistance Where Assessed-Upper Body Bathing: Edge of bed Lower Body Bathing: Moderate assistance Where Assessed-Lower Body Bathing: Edge of bed Upper Body Dressing: Minimal assistance Where Assessed-Upper Body Dressing: Edge of bed Lower Body Dressing: Maximal assistance Where Assessed-Lower Body Dressing: Edge of bed Toileting: Setup Where Assessed-Toileting: Bed level (urinal) Toilet Transfer: Not assessed Tub/Shower Transfer: Not assessed Social research officer, government: Not assessed    Balance- patient with lean towards the right during standing tasks but was able to self correct and responded well to vc's to achieve midline.      Therapy/Group: Individual Therapy  Hermina Barters 01/02/2022, 10:29 AM

## 2022-01-03 DIAGNOSIS — I6381 Other cerebral infarction due to occlusion or stenosis of small artery: Secondary | ICD-10-CM | POA: Diagnosis not present

## 2022-01-03 LAB — GLUCOSE, CAPILLARY: Glucose-Capillary: 145 mg/dL — ABNORMAL HIGH (ref 70–99)

## 2022-01-03 NOTE — Plan of Care (Signed)
  Problem: Consults Goal: RH STROKE PATIENT EDUCATION Description: See Patient Education module for education specifics  Outcome: Progressing   Problem: RH SAFETY Goal: RH STG ADHERE TO SAFETY PRECAUTIONS W/ASSISTANCE/DEVICE Description: STG Adhere to Safety Precautions With cues Assistance/Device. Outcome: Progressing   Problem: RH KNOWLEDGE DEFICIT Goal: RH STG INCREASE KNOWLEDGE OF HYPERTENSION Description: Patient and family will be able to manage HTN with medications and dietary modifications using handouts and educational resources independently Outcome: Progressing Goal: RH STG INCREASE KNOWLEGDE OF HYPERLIPIDEMIA Description: Patient and family will be able to manage HLD with medications and dietary modifications using handouts and educational resources independently Outcome: Progressing Goal: RH STG INCREASE KNOWLEDGE OF STROKE PROPHYLAXIS Description: Patient and family will be able to manage secondary risks with medications and dietary modifications using handouts and educational resources independently Outcome: Progressing

## 2022-01-03 NOTE — Progress Notes (Signed)
Occupational Therapy Session Note  Patient Details  Name: Mark Jackson. MRN: [id number] Date of Birth: 10-Jun-1930  Today's Date: 01/03/2022 OT Individual Time: 0818-1000 OT Individual Time Calculation (min): 102 min  + 35 min   Short Term Goals: Week 1:  OT Short Term Goal 1 (Week 1): Pt will maintain static sitting balance EOB with S to complete ADL. OT Short Term Goal 2 (Week 1): Pt will don pants with mod A. OT Short Term Goal 3 (Week 1): Pt will complete toilet transfer with mod A and LRAD. OT Short Term Goal 4 (Week 1): Pt will complete standing grooming task at sink with min A.  Skilled Therapeutic Interventions/Progress Updates:    Session 1 (7903-8333): Pt received in bed with Nts provided linen/brief change post incontinent void of bowel, reports R hip pain but, agreeable to therapy. Session focus on self-care retraining, activity tolerance, transfer retraining in prep for improved ADL/IADL/func mobility performance + decreased caregiver burden.  Came to sitting EOB with close S. Completed UB dressing and grooming when handed materials and with increased time. Total A to don B teds, mod A to don L shoe and tie, pt able to do R shoe himself via figure 4 position. Donned pants with min A to come into standing due to posterior bias.   Stood to urinate with heavy min A due to R lean without BUE support. Ambulated > w/c at sink with RW and CGA, cues for improved step length on R. Seated, shaved with set-up A while seated, stood with CGA to finish.   Ambulated to and from toilet with overall CGA and cues to turn fully prior to sitting, min A for balance during toileting tasks due to R lean without UE support. Further void of BM.  Pt participated in 1 round of corn hole, CGA for balance throughout with LUE on RW. Able to reach behind him on mat with RUE to toss bags.  Pt left seated in recliner with safety belt alarm engaged, call bell in reach, and all immediate needs met.     Session 2 (1030-1105): Pt received seated in recliner, no c/o pain, agreeable to therapy. Session focus on  activity tolerance, dynamic /static standing balance, R NMR in prep for improved ADL/IADL/func mobility performance + decreased caregiver burden.  Ambulated to and from gym with overall light min A to overcome posterior bias to come into standing, and then CGA to ambulate remainder of way. Much improved step length this date on R. Pt practiced retrieving resistive clothes pins level via pincer grasp at knee height and reaching across midline to place on horizontal bar above shoulder height. CGA for balance throughout. No BUE support and no LOB throughout.  Pt left seated in recliner with safety belt alarm engaged, call bell in reach, and all immediate needs met.    Therapy Documentation Precautions:  Precautions Precautions: Fall Precaution Comments: R hemiparesis RLE>RUE Restrictions Weight Bearing Restrictions: No  Pain:  See session notes  ADL: See Care Tool for more details.  Therapy/Group: Individual Therapy  Volanda Napoleon MS, OTR/L  01/03/2022, 6:49 AM

## 2022-01-03 NOTE — Progress Notes (Signed)
PROGRESS NOTE   Subjective/Complaints:  Pt reports R knee pain- "just there" but was unable to tell if mild/moderate or severe.  LBM yesterday.  No issues per pt.  Ate 100% breakfast tray.    ROS-  Pt denies SOB, abd pain, CP, N/V/C/D, and vision changes    Objective:   No results found. No results for input(s): WBC, HGB, HCT, PLT in the last 72 hours.  No results for input(s): NA, K, CL, CO2, GLUCOSE, BUN, CREATININE, CALCIUM in the last 72 hours.   Intake/Output Summary (Last 24 hours) at 01/03/2022 0951 Last data filed at 01/03/2022 0830 Gross per 24 hour  Intake 1074 ml  Output 350 ml  Net 724 ml        Physical Exam: Vital Signs Blood pressure (!) 163/69, pulse 66, temperature 97.8 F (36.6 C), resp. rate 18, height '5\' 8"'$  (1.727 m), weight 83 kg, SpO2 100 %.    General: awake, alert, appropriate, sitting up in bed; joking with nurse in room; NAD HENT: conjugate gaze; oropharynx moist CV: regular rate; no JVD Pulmonary: CTA B/L; no W/R/R- good air movement GI: soft, NT, ND, (+)BS Psychiatric: appropriate Neurological: alert Extremities: No clubbing, cyanosis, or edema Skin: No evidence of breakdown, no evidence of rash Neurologic: Cranial nerves II through XII intact, motor strength is 5/5 in bilateral deltoid, bicep, tricep, grip,3- hip flexor, knee extensors, ankle dorsiflexor and plantar flexor Sensory exam normal sensation to light touch  bilateral upper and lower extremities Cerebellar exam normal finger to nose to finger mild dysmetria L FNF Musculoskeletal: Full range of motion in all 4 extremities. No joint swelling   Assessment/Plan: 1. Functional deficits which require 3+ hours per day of interdisciplinary therapy in a comprehensive inpatient rehab setting. Physiatrist is providing close team supervision and 24 hour management of active medical problems listed below. Physiatrist and rehab  team continue to assess barriers to discharge/monitor patient progress toward functional and medical goals  Care Tool:  Bathing    Body parts bathed by patient: Face, Right arm, Left arm, Chest, Front perineal area, Buttocks         Bathing assist Assist Level: Maximal Assistance - Patient 24 - 49%     Upper Body Dressing/Undressing Upper body dressing   What is the patient wearing?: Button up shirt, Pull over shirt    Upper body assist Assist Level: Set up assist    Lower Body Dressing/Undressing Lower body dressing      What is the patient wearing?: Pants, Underwear/pull up     Lower body assist Assist for lower body dressing: Minimal Assistance - Patient > 75%     Toileting Toileting    Toileting assist Assist for toileting: Minimal Assistance - Patient > 75% Assistive Device Comment: RW   Transfers Chair/bed transfer  Transfers assist     Chair/bed transfer assist level: Minimal Assistance - Patient > 75%     Locomotion Ambulation   Ambulation assist      Assist level: Minimal Assistance - Patient > 75% Assistive device: Walker-rolling Max distance: 43 ft   Walk 10 feet activity   Assist     Assist level: Minimal Assistance -  Patient > 75% Assistive device: Walker-rolling   Walk 50 feet activity   Assist Walk 50 feet with 2 turns activity did not occur: Safety/medical concerns         Walk 150 feet activity   Assist Walk 150 feet activity did not occur: Safety/medical concerns         Walk 10 feet on uneven surface  activity   Assist Walk 10 feet on uneven surfaces activity did not occur: Safety/medical concerns         Wheelchair     Assist Is the patient using a wheelchair?: Yes Type of Wheelchair: Manual    Wheelchair assist level: Total Assistance - Patient < 25% Max wheelchair distance: 250 ft    Wheelchair 50 feet with 2 turns activity    Assist        Assist Level: Total Assistance -  Patient < 25%   Wheelchair 150 feet activity     Assist      Assist Level: Total Assistance - Patient < 25%   Blood pressure (!) 163/69, pulse 66, temperature 97.8 F (36.6 C), resp. rate 18, height '5\' 8"'$  (1.727 m), weight 83 kg, SpO2 100 %.  Medical Problem List and Plan:likely cardioembolic  1. Functional deficits secondary to left thalamic infarction and small left cerebellar infarct             -patient may shower             -ELOS/Goals: 01/13/22  mod I to supervision goals initial evals today -discussed with pt   Con't CIR_ PT, OT and SLP 2.  Antithrombotics: -DVT/anticoagulation:  Pharmaceutical: Other (comment) Eliquis             -antiplatelet therapy: N/A 3. Pain Management: Tylenol as needed             -add voltaren gel QID for endstage OA left knee  5/27- pain just "there"- doing OK- will con't voltaren gel 4. Mood: Provide emotional support             -antipsychotic agents: N/A 5. Neuropsych: This patient is capable of making decisions on his own behalf. 6. Skin/Wound Care: Routine skin checks 7. Fluids/Electrolytes/Nutrition: Routine in and outs with follow-up chemistries 8.  Atrial fibrillation.  Cardizem 120 mg daily.  Continue Eliquis.  Cardiac rate controlled at present Vitals:   01/03/22 0539 01/03/22 0541  BP: (!) 168/72 (!) 163/69  Pulse: 68 66  Resp:    Temp:    SpO2:     Some lability but will keep on current meds unless HR or BP start to climb   5/27- BP in 109N systolic in last 24 hours- will monitor and if stays up/trend is up, will increase meds 9.  Hyperlipidemia.  Crestor 10.  Diastolic congestive heart failure.  Lasix 20 mg daily.  Monitor for any signs of fluid overload             -daily weights Filed Weights   12/29/21 1338  Weight: 83 kg    5/27- made sure has daily weights- will monitor-  11.  CAD/stenting/TAVR.  Continue Imdur 60 mg daily 12.  CKD stage IV.  Check CMET tomorrow    Latest Ref Rng & Units 12/30/2021    5:32 AM  12/28/2021    1:52 AM 12/26/2021    7:02 PM  BMP  Glucose 70 - 99 mg/dL 106   105   99    BUN 8 - 23 mg/dL 57  56   61    Creatinine 0.61 - 1.24 mg/dL 2.81   2.69   2.88    Sodium 135 - 145 mmol/L 138   141   141    Potassium 3.5 - 5.1 mmol/L 4.4   4.4   4.3    Chloride 98 - 111 mmol/L 113   116   115    CO2 22 - 32 mmol/L '20   20   18    '$ Calcium 8.9 - 10.3 mg/dL 8.1   8.3   8.6      13.  History of migraine headaches.  Topamax 150 mg nightly 14.  Chronic anemia.      Latest Ref Rng & Units 12/30/2021    5:32 AM 12/28/2021    1:52 AM 12/26/2021    7:02 PM  CBC  WBC 4.0 - 10.5 K/uL 11.6   10.3   11.4    Hemoglobin 13.0 - 17.0 g/dL 10.0   9.9   11.8    Hematocrit 39.0 - 52.0 % 29.5   30.8   37.0    Platelets 150 - 400 K/uL 127   120   126      15.  BPH with history of prostate cancer.  Check PVR and follow for patterns. Pt denies any emptying issues at present    LOS: 5 days A FACE TO FACE EVALUATION WAS PERFORMED  Tareek Sabo 01/03/2022, 9:51 AM

## 2022-01-03 NOTE — Progress Notes (Signed)
Physical Therapy Session Note  Patient Details  Name: Mark Jackson. MRN: [id number] Date of Birth: Dec 15, 1929  Today's Date: 01/03/2022 PT Individual Time: 6237-6283 PT Individual Time Calculation (min): 68 min   Short Term Goals: Week 1:  PT Short Term Goal 1 (Week 1): Pt will perform bed mobility using no bed features with CGA. PT Short Term Goal 2 (Week 1): Pt will perform all functional standing transfers with supervision and no posterior bias. PT Short Term Goal 3 (Week 1): Pt will ambulate 150 feet consistently with CGA/ supervision using LRAD. PT Short Term Goal 4 (Week 1): Pt will ambulate steps using BHR with reciprocating gait pattern and CGA.   Skilled Therapeutic Interventions/Progress Updates:   Pt received sitting in recliner and agreeable to PT. Pt performed stand pivot transfer with RW and min assist to prevent posterior bias initially and for safety.   Pt transported to rehab gym in [facility]. Stair management training with CGA for ascent and min assist for descent with cues for step to gait pattern   Dynamic gait training with RW to step over 4  1 inch obstacle and weave through 6 cones performed x 2 with min assist and cues for step length on the RLE and improved AD management in turns.   Dynamic balance training while engaged in fine motor and cognitive task of peg board puzzle. Pt perfomred low and moderate difficulty puzzles while standing on red wedge to force anterior weight shift. Assist for color identification due to color blindness  Pt reports feeling like cognition has been affect by CVA. Will request SLP consult next week.   Patient returned to room and performed stand pivot to recliner with RW and CGA for safety. Pt left sitting in recliner with call bell in reach and all needs met.          Therapy Documentation Precautions:  Precautions Precautions: Fall Precaution Comments: R hemiparesis RLE>RUE Restrictions Weight Bearing Restrictions: No     Vital Signs: Therapy Vitals Temp: 98.1 F (36.7 C) Temp Source: Oral Pulse Rate: 82 Resp: 17 BP: (!) 144/61 Patient Position (if appropriate): Sitting Oxygen Therapy SpO2: 100 % O2 Device: Room Air Pain: denies    Therapy/Group: Individual Therapy  Lorie Phenix 01/03/2022, 4:41 PM

## 2022-01-04 DIAGNOSIS — I6381 Other cerebral infarction due to occlusion or stenosis of small artery: Secondary | ICD-10-CM | POA: Diagnosis not present

## 2022-01-04 NOTE — Progress Notes (Signed)
PROGRESS NOTE   Subjective/Complaints:  Pt reports wants ot go home- but knows not ready yet.  OK otherwise.  ROS-   Pt denies SOB, abd pain, CP, N/V/C/D, and vision changes   Objective:   No results found. No results for input(s): WBC, HGB, HCT, PLT in the last 72 hours.  No results for input(s): NA, K, CL, CO2, GLUCOSE, BUN, CREATININE, CALCIUM in the last 72 hours.   Intake/Output Summary (Last 24 hours) at 01/04/2022 0944 Last data filed at 01/04/2022 0232 Gross per 24 hour  Intake --  Output 450 ml  Net -450 ml        Physical Exam: Vital Signs Blood pressure (!) 146/67, pulse 77, temperature 97.8 F (36.6 C), temperature source Oral, resp. rate 18, height '5\' 8"'$  (1.727 m), weight 83 kg, SpO2 98 %.     General: awake, alert, appropriate, sitting up in bed; says sleepy; NAD HENT: conjugate gaze; oropharynx moist CV: regular rate; no JVD Pulmonary: CTA B/L; no W/R/R- good air movement GI: soft, NT, ND, (+)BS Psychiatric: appropriate- but a little more flat today Neurological: alert Extremities: No clubbing, cyanosis, or edema Skin: No evidence of breakdown, no evidence of rash Neurologic: Cranial nerves II through XII intact, motor strength is 5/5 in bilateral deltoid, bicep, tricep, grip,3- hip flexor, knee extensors, ankle dorsiflexor and plantar flexor Sensory exam normal sensation to light touch  bilateral upper and lower extremities Cerebellar exam normal finger to nose to finger mild dysmetria L FNF Musculoskeletal: Full range of motion in all 4 extremities. No joint swelling   Assessment/Plan: 1. Functional deficits which require 3+ hours per day of interdisciplinary therapy in a comprehensive inpatient rehab setting. Physiatrist is providing close team supervision and 24 hour management of active medical problems listed below. Physiatrist and rehab team continue to assess barriers to  discharge/monitor patient progress toward functional and medical goals  Care Tool:  Bathing    Body parts bathed by patient: Right arm, Left arm, Chest, Abdomen, Front perineal area, Right upper leg, Left upper leg, Right lower leg, Left lower leg   Body parts bathed by helper: Buttocks (feet, back)     Bathing assist Assist Level: Moderate Assistance - Patient 50 - 74%     Upper Body Dressing/Undressing Upper body dressing   What is the patient wearing?: Button up shirt    Upper body assist Assist Level: Set up assist    Lower Body Dressing/Undressing Lower body dressing      What is the patient wearing?: Pants, Underwear/pull up     Lower body assist Assist for lower body dressing: Minimal Assistance - Patient > 75%     Toileting Toileting    Toileting assist Assist for toileting: Minimal Assistance - Patient > 75% Assistive Device Comment: RW   Transfers Chair/bed transfer  Transfers assist     Chair/bed transfer assist level: Minimal Assistance - Patient > 75%     Locomotion Ambulation   Ambulation assist      Assist level: Minimal Assistance - Patient > 75% Assistive device: Walker-rolling Max distance: 43 ft   Walk 10 feet activity   Assist     Assist level:  Minimal Assistance - Patient > 75% Assistive device: Walker-rolling   Walk 50 feet activity   Assist Walk 50 feet with 2 turns activity did not occur: Safety/medical concerns         Walk 150 feet activity   Assist Walk 150 feet activity did not occur: Safety/medical concerns         Walk 10 feet on uneven surface  activity   Assist Walk 10 feet on uneven surfaces activity did not occur: Safety/medical concerns         Wheelchair     Assist Is the patient using a wheelchair?: Yes Type of Wheelchair: Manual    Wheelchair assist level: Total Assistance - Patient < 25% Max wheelchair distance: 250 ft    Wheelchair 50 feet with 2 turns  activity    Assist        Assist Level: Total Assistance - Patient < 25%   Wheelchair 150 feet activity     Assist      Assist Level: Total Assistance - Patient < 25%   Blood pressure (!) 146/67, pulse 77, temperature 97.8 F (36.6 C), temperature source Oral, resp. rate 18, height '5\' 8"'$  (1.727 m), weight 83 kg, SpO2 98 %.  Medical Problem List and Plan:likely cardioembolic  1. Functional deficits secondary to left thalamic infarction and small left cerebellar infarct             -patient may shower             -ELOS/Goals: 01/13/22  mod I to supervision goals initial evals today -discussed with pt   Con't CIR- PT, OT and SLP 2.  Antithrombotics: -DVT/anticoagulation:  Pharmaceutical: Other (comment) Eliquis             -antiplatelet therapy: N/A 3. Pain Management: Tylenol as needed             -add voltaren gel QID for endstage OA left knee  5/27- pain just "there"- doing OK- will con't voltaren gel 4. Mood: Provide emotional support             -antipsychotic agents: N/A 5. Neuropsych: This patient is capable of making decisions on his own behalf. 6. Skin/Wound Care: Routine skin checks 7. Fluids/Electrolytes/Nutrition: Routine in and outs with follow-up chemistries 8.  Atrial fibrillation.  Cardizem 120 mg daily.  Continue Eliquis.  Cardiac rate controlled at present Vitals:   01/03/22 1957 01/04/22 0603  BP: 128/66 (!) 146/67  Pulse: 72 77  Resp: 16 18  Temp: 98.4 F (36.9 C) 97.8 F (36.6 C)  SpO2: 98% 98%   Some lability but will keep on current meds unless HR or BP start to climb   5/27- BP in 505L systolic in last 24 hours- will monitor and if stays up/trend is up, will increase meds  5/28- BP much better- 120s-140s- con't regimen 9.  Hyperlipidemia.  Crestor 10.  Diastolic congestive heart failure.  Lasix 20 mg daily.  Monitor for any signs of fluid overload             -daily weights Filed Weights   12/29/21 1338  Weight: 83 kg    5/27- made sure  has daily weights- will monitor- 5/28- ordered- will double check with nursing- don't se ein computer  11.  CAD/stenting/TAVR.  Continue Imdur 60 mg daily 12.  CKD stage IV.  Check CMET tomorrow    Latest Ref Rng & Units 12/30/2021    5:32 AM 12/28/2021    1:52 AM  12/26/2021    7:02 PM  BMP  Glucose 70 - 99 mg/dL 106   105   99    BUN 8 - 23 mg/dL 57   56   61    Creatinine 0.61 - 1.24 mg/dL 2.81   2.69   2.88    Sodium 135 - 145 mmol/L 138   141   141    Potassium 3.5 - 5.1 mmol/L 4.4   4.4   4.3    Chloride 98 - 111 mmol/L 113   116   115    CO2 22 - 32 mmol/L '20   20   18    '$ Calcium 8.9 - 10.3 mg/dL 8.1   8.3   8.6      13.  History of migraine headaches.  Topamax 150 mg nightly 14.  Chronic anemia.      Latest Ref Rng & Units 12/30/2021    5:32 AM 12/28/2021    1:52 AM 12/26/2021    7:02 PM  CBC  WBC 4.0 - 10.5 K/uL 11.6   10.3   11.4    Hemoglobin 13.0 - 17.0 g/dL 10.0   9.9   11.8    Hematocrit 39.0 - 52.0 % 29.5   30.8   37.0    Platelets 150 - 400 K/uL 127   120   126      15.  BPH with history of prostate cancer.  Check PVR and follow for patterns. Pt denies any emptying issues at present    LOS: 6 days A FACE TO FACE EVALUATION WAS PERFORMED  Sheetal Lyall 01/04/2022, 9:44 AM

## 2022-01-04 NOTE — Plan of Care (Signed)
  Problem: Consults Goal: RH STROKE PATIENT EDUCATION Description: See Patient Education module for education specifics  Outcome: Progressing   Problem: RH SAFETY Goal: RH STG ADHERE TO SAFETY PRECAUTIONS W/ASSISTANCE/DEVICE Description: STG Adhere to Safety Precautions With cues Assistance/Device. Outcome: Progressing   Problem: RH KNOWLEDGE DEFICIT Goal: RH STG INCREASE KNOWLEDGE OF HYPERTENSION Description: Patient and family will be able to manage HTN with medications and dietary modifications using handouts and educational resources independently Outcome: Progressing Goal: RH STG INCREASE KNOWLEGDE OF HYPERLIPIDEMIA Description: Patient and family will be able to manage HLD with medications and dietary modifications using handouts and educational resources independently Outcome: Progressing Goal: RH STG INCREASE KNOWLEDGE OF STROKE PROPHYLAXIS Description: Patient and family will be able to manage secondary risks with medications and dietary modifications using handouts and educational resources independently Outcome: Progressing

## 2022-01-04 NOTE — Progress Notes (Signed)
Physical Therapy Session Note  Patient Details  Name: Mark Jackson. MRN: [id number] Date of Birth: March 20, 1930  Today's Date: 01/04/2022 PT Individual Time: 0800-0915 PT Individual Time Calculation (min): 75 min   Short Term Goals: Week 1:  PT Short Term Goal 1 (Week 1): Pt will perform bed mobility using no bed features with CGA. PT Short Term Goal 2 (Week 1): Pt will perform all functional standing transfers with supervision and no posterior bias. PT Short Term Goal 3 (Week 1): Pt will ambulate 150 feet consistently with CGA/ supervision using LRAD. PT Short Term Goal 4 (Week 1): Pt will ambulate steps using BHR with reciprocating gait pattern and CGA.  Skilled Therapeutic Interventions/Progress Updates: Pt presents supine in bed and agreeable to therapy.  Pt states "I am wet".  Pt transfers sup to sit w/ elevated head w/ CGA and increased time.  Pt able to don R slipper sock w/ Figure-4 posiiton but requires assist w/ Left 2/2 balance.  Pt transferred sit to stand w/ min A from elevated bed height.  Pt amb x 15' into BR w/ RW and CGA.  Pt required min A for managing clothing, CGA for stand to sit at toilet  Pt transferred sit to stand w/ min Aa nd PT completed pericare, including washing buttocks.  Pt stood and performed front pericare after handed washcloth.  Pt pulled up personal brief.  Pt amb to sink and stood for handwashing, brushing teeth and comb hair w/ CGA.  Pt amb to recliner w/ RW and min to CGA.  Pt washed B legs in sitting, PT completed feet.  Pt doffed shirt and T-shirt w/ CGA.  PT donned total A B TEDS and threaded pants over feet w/ mod A.  Pt washed UB except for back (PT performed).  Pt transferred sit to stand w/ min A and verbal cues for initiation, especially forward lean.  Pt pulled up pants w/ min A and pulling up zipper and buttoning pants, aware of posterior and R-sided bias, but not correcting requiring mod to max A from PT.  Pt donned button-down shirt w/ increased  time including buttons.  Pt remained sitting in reliner w/ chair alarm on and all needs in reach.     Therapy Documentation Precautions:  Precautions Precautions: Fall Precaution Comments: R hemiparesis RLE>RUE Restrictions Weight Bearing Restrictions: No General:   Vital Signs: Therapy Vitals Temp: 97.8 F (36.6 C) Temp Source: Oral Pulse Rate: 77 Resp: 18 BP: (!) 146/67 Patient Position (if appropriate): Lying Oxygen Therapy SpO2: 98 % O2 Device: Room Air Pain: some pain, not quantifying.        Therapy/Group: Individual Therapy  [name] 01/04/2022, 9:22 AM

## 2022-01-05 DIAGNOSIS — I6381 Other cerebral infarction due to occlusion or stenosis of small artery: Secondary | ICD-10-CM | POA: Diagnosis not present

## 2022-01-05 LAB — BASIC METABOLIC PANEL
Anion gap: 4 — ABNORMAL LOW (ref 5–15)
BUN: 72 mg/dL — ABNORMAL HIGH (ref 8–23)
CO2: 19 mmol/L — ABNORMAL LOW (ref 22–32)
Calcium: 8.2 mg/dL — ABNORMAL LOW (ref 8.9–10.3)
Chloride: 117 mmol/L — ABNORMAL HIGH (ref 98–111)
Creatinine, Ser: 2.97 mg/dL — ABNORMAL HIGH (ref 0.61–1.24)
GFR, Estimated: 19 mL/min — ABNORMAL LOW (ref >60)
Glucose, Bld: 94 mg/dL (ref 70–99)
Potassium: 4.5 mmol/L (ref 3.5–5.1)
Sodium: 140 mmol/L (ref 135–145)

## 2022-01-05 LAB — CBC
HCT: 29.6 % — ABNORMAL LOW (ref 39.0–52.0)
Hemoglobin: 9.8 g/dL — ABNORMAL LOW (ref 13.0–17.0)
MCH: 33.6 pg (ref 26.0–34.0)
MCHC: 33.1 g/dL (ref 30.0–36.0)
MCV: 101.4 fL — ABNORMAL HIGH (ref 80.0–100.0)
Platelets: 195 10*3/uL (ref 150–400)
RBC: 2.92 MIL/uL — ABNORMAL LOW (ref 4.22–5.81)
RDW: 12.5 % (ref 11.5–15.5)
WBC: 10.5 10*3/uL (ref 4.0–10.5)
nRBC: 0 % (ref 0.0–0.2)

## 2022-01-05 MED ORDER — DILTIAZEM HCL ER COATED BEADS 180 MG PO CP24
180.0000 mg | ORAL_CAPSULE | Freq: Every day | ORAL | Status: DC
  Administered 2022-01-06 – 2022-01-09 (×4): 180 mg via ORAL
  Filled 2022-01-05 (×4): qty 1

## 2022-01-05 NOTE — Plan of Care (Signed)
  Problem: Consults Goal: RH STROKE PATIENT EDUCATION Description: See Patient Education module for education specifics  Outcome: Progressing   Problem: RH SAFETY Goal: RH STG ADHERE TO SAFETY PRECAUTIONS W/ASSISTANCE/DEVICE Description: STG Adhere to Safety Precautions With cues Assistance/Device. Outcome: Progressing   Problem: RH KNOWLEDGE DEFICIT Goal: RH STG INCREASE KNOWLEDGE OF HYPERTENSION Description: Patient and family will be able to manage HTN with medications and dietary modifications using handouts and educational resources independently Outcome: Progressing Goal: RH STG INCREASE KNOWLEGDE OF HYPERLIPIDEMIA Description: Patient and family will be able to manage HLD with medications and dietary modifications using handouts and educational resources independently Outcome: Progressing Goal: RH STG INCREASE KNOWLEDGE OF STROKE PROPHYLAXIS Description: Patient and family will be able to manage secondary risks with medications and dietary modifications using handouts and educational resources independently Outcome: Progressing

## 2022-01-05 NOTE — Progress Notes (Signed)
Occupational Therapy Session Note  Patient Details  Name: Mark Jackson. MRN: [id number] Date of Birth: 1930-07-26  Today's Date: 01/05/2022 OT Individual Time: 2423-5361 OT Individual Time Calculation (min): 60 min    Short Term Goals: Week 1:  OT Short Term Goal 1 (Week 1): Pt will maintain static sitting balance EOB with S to complete ADL. OT Short Term Goal 2 (Week 1): Pt will don pants with mod A. OT Short Term Goal 3 (Week 1): Pt will complete toilet transfer with mod A and LRAD. OT Short Term Goal 4 (Week 1): Pt will complete standing grooming task at sink with min A.  Skilled Therapeutic Interventions/Progress Updates:  Pt seen for skilled OT visit this afternoon. Pt rec'd up in w/c following PT visit and care coordination with PT verbally for status. Pt had just completed using urinal and was open to all therapy activities presented. OT transported pt to far therapy dayroom/gym in w/c for time management purposes. Pt reports he has a NuStep at home thus OT added to session to mirror realistic HEP carryover. Transfer off and on NuStep with Min A/CGA with cues for stand to sit deceleration control and tactile cues for hand placement. Increased time and effort for B LE's to manage to straddle device and place feet on pedals. Once seated pt performed 4 minutes x 3 intervals set at workload L2. Rest breaks between sets and cues for pacing. HR remained between 74-78 BPM. Once back in w/c, OT transported pt into demonstration ADL apartment therapy space into kitchen for standing tolerance, dynamic reaching task and item retrieval simulation. Pt stood 3 sets of 3 minutes with seated rests and CGA using countertop for unilateral support. Reaching at 3-4" max for items in various planes at shoulder height and waist height with R UE and placement in cabinet at eye level or in bowl in sink. All stand to sits were improved from initial with + carryover. OT transported pt back to room, place waist  chair alarm set, call bell and needs in reach. No pain reported throughout session.   Therapy Documentation Precautions:  Precautions Precautions: Fall Precaution Comments: R hemiparesis RLE>RUE Restrictions Weight Bearing Restrictions: No    Therapy/Group: Individual Therapy  Barnabas Lister 01/05/2022, 11:51 AM

## 2022-01-05 NOTE — Progress Notes (Signed)
PROGRESS NOTE   Subjective/Complaints:  Discussed elevated BUN, need for increased fluid intake  ROS-   Pt denies SOB, abd pain, CP, N/V/C/D, and vision changes   Objective:   No results found. Recent Labs    01/05/22 0559  WBC 10.5  HGB 9.8*  HCT 29.6*  PLT 195    Recent Labs    01/05/22 0559  NA 140  K 4.5  CL 117*  CO2 19*  GLUCOSE 94  BUN 72*  CREATININE 2.97*  CALCIUM 8.2*     Intake/Output Summary (Last 24 hours) at 01/05/2022 0811 Last data filed at 01/05/2022 0756 Gross per 24 hour  Intake 891 ml  Output 1175 ml  Net -284 ml         Physical Exam: Vital Signs Blood pressure (!) 159/78, pulse 79, temperature 97.9 F (36.6 C), temperature source Oral, resp. rate 16, height '5\' 8"'$  (1.727 m), weight 83 kg, SpO2 97 %.   General: No acute distress Mood and affect are appropriate Heart: Regular rate and rhythm no rubs murmurs or extra sounds Lungs: Clear to auscultation, breathing unlabored, no rales or wheezes Abdomen: Positive bowel sounds, soft nontender to palpation, nondistended Extremities: No clubbing, cyanosis, or edema   Skin: No evidence of breakdown, no evidence of rash    Assessment/Plan: 1. Functional deficits which require 3+ hours per day of interdisciplinary therapy in a comprehensive inpatient rehab setting. Physiatrist is providing close team supervision and 24 hour management of active medical problems listed below. Physiatrist and rehab team continue to assess barriers to discharge/monitor patient progress toward functional and medical goals  Care Tool:  Bathing    Body parts bathed by patient: Right arm, Left arm, Chest, Abdomen, Front perineal area, Right upper leg, Left upper leg, Right lower leg, Left lower leg   Body parts bathed by helper: Buttocks (feet, back)     Bathing assist Assist Level: Moderate Assistance - Patient 50 - 74%     Upper Body  Dressing/Undressing Upper body dressing   What is the patient wearing?: Button up shirt    Upper body assist Assist Level: Set up assist    Lower Body Dressing/Undressing Lower body dressing      What is the patient wearing?: Pants, Underwear/pull up     Lower body assist Assist for lower body dressing: Minimal Assistance - Patient > 75%     Toileting Toileting    Toileting assist Assist for toileting: Minimal Assistance - Patient > 75% Assistive Device Comment: RW   Transfers Chair/bed transfer  Transfers assist     Chair/bed transfer assist level: Minimal Assistance - Patient > 75%     Locomotion Ambulation   Ambulation assist      Assist level: Minimal Assistance - Patient > 75% Assistive device: Walker-rolling Max distance: 43 ft   Walk 10 feet activity   Assist     Assist level: Minimal Assistance - Patient > 75% Assistive device: Walker-rolling   Walk 50 feet activity   Assist Walk 50 feet with 2 turns activity did not occur: Safety/medical concerns         Walk 150 feet activity   Assist Walk 150  feet activity did not occur: Safety/medical concerns         Walk 10 feet on uneven surface  activity   Assist Walk 10 feet on uneven surfaces activity did not occur: Safety/medical concerns         Wheelchair     Assist Is the patient using a wheelchair?: Yes Type of Wheelchair: Manual    Wheelchair assist level: Total Assistance - Patient < 25% Max wheelchair distance: 250 ft    Wheelchair 50 feet with 2 turns activity    Assist        Assist Level: Total Assistance - Patient < 25%   Wheelchair 150 feet activity     Assist      Assist Level: Total Assistance - Patient < 25%   Blood pressure (!) 159/78, pulse 79, temperature 97.9 F (36.6 C), temperature source Oral, resp. rate 16, height '5\' 8"'$  (1.727 m), weight 83 kg, SpO2 97 %.  Medical Problem List and Plan:likely cardioembolic  1. Functional  deficits secondary to left thalamic infarction and small left cerebellar infarct             -patient may shower             -ELOS/Goals: 01/13/22  mod I to supervision goals initial evals today -discussed with pt   Con't CIR- PT, OT and SLP 2.  Antithrombotics: -DVT/anticoagulation:  Pharmaceutical: Other (comment) Eliquis             -antiplatelet therapy: N/A 3. Pain Management: Tylenol as needed             -add voltaren gel QID for endstage OA left knee  5/27- pain just "there"- doing OK- will con't voltaren gel 4. Mood: Provide emotional support             -antipsychotic agents: N/A 5. Neuropsych: This patient is capable of making decisions on his own behalf. 6. Skin/Wound Care: Routine skin checks 7. Fluids/Electrolytes/Nutrition: Routine in and outs with follow-up chemistries 8.  Atrial fibrillation.  Cardizem 120 mg daily.  Continue Eliquis.  Cardiac rate controlled at present Vitals:   01/04/22 1957 01/05/22 0512  BP: (!) 166/77 (!) 159/78  Pulse: 73 79  Resp: 17 16  Temp: 97.9 F (36.6 C) 97.9 F (36.6 C)  SpO2: 99% 97%   Elevated systolic with HR in 16X will increase cardizem to '180mg'$   9.  Hyperlipidemia.  Crestor 10.  Diastolic congestive heart failure.  Lasix 20 mg daily.  Monitor for any signs of fluid overload             -daily weights Filed Weights   12/29/21 1338  Weight: 83 kg    5/27- made sure has daily weights- will monitor- 5/28- ordered- will double check with nursing- don't se ein computer  11.  CAD/stenting/TAVR.  Continue Imdur 60 mg daily 12.  CKD stage IV.  Check CMET tomorrow    Latest Ref Rng & Units 01/05/2022    5:59 AM 12/30/2021    5:32 AM 12/28/2021    1:52 AM  BMP  Glucose 70 - 99 mg/dL 94   106   105    BUN 8 - 23 mg/dL 72   57   56    Creatinine 0.61 - 1.24 mg/dL 2.97   2.81   2.69    Sodium 135 - 145 mmol/L 140   138   141    Potassium 3.5 - 5.1 mmol/L 4.5   4.4  4.4    Chloride 98 - 111 mmol/L 117   113   116    CO2 22 - 32  mmol/L '19   20   20    '$ Calcium 8.9 - 10.3 mg/dL 8.2   8.1   8.3      13.  History of migraine headaches.  Topamax 150 mg nightly 14.  Chronic anemia.      Latest Ref Rng & Units 01/05/2022    5:59 AM 12/30/2021    5:32 AM 12/28/2021    1:52 AM  CBC  WBC 4.0 - 10.5 K/uL 10.5   11.6   10.3    Hemoglobin 13.0 - 17.0 g/dL 9.8   10.0   9.9    Hematocrit 39.0 - 52.0 % 29.6   29.5   30.8    Platelets 150 - 400 K/uL 195   127   120      15.  BPH with history of prostate cancer.  Check PVR and follow for patterns. Pt denies any emptying issues at present    LOS: 7 days A FACE TO FACE EVALUATION WAS PERFORMED  Charlett Blake 01/05/2022, 8:11 AM

## 2022-01-05 NOTE — Progress Notes (Signed)
Physical Therapy Session Note  Patient Details  Name: Mark Jackson. MRN: [id number] Date of Birth: 08-28-1929  Today's Date: 01/05/2022 PT Individual Time: 1100-1130 PT Individual Time Calculation (min): 30 min   Short Term Goals: Week 1:  PT Short Term Goal 1 (Week 1): Pt will perform bed mobility using no bed features with CGA. PT Short Term Goal 2 (Week 1): Pt will perform all functional standing transfers with supervision and no posterior bias. PT Short Term Goal 3 (Week 1): Pt will ambulate 150 feet consistently with CGA/ supervision using LRAD. PT Short Term Goal 4 (Week 1): Pt will ambulate steps using BHR with reciprocating gait pattern and CGA.  Skilled Therapeutic Interventions/Progress Updates:  Pt seated in wc.  He stated that his name was "Mark Jackson" today.  He denied pain.  Stand pivot transfer to L with min/mod assist, with L hand on mat table for orientation.  R knee buckled as pt transferred. Stand pivot to R to return to wc with min assist, R hand reaching for armrest for orientation.   Therapeutic activity in sitting to facilitate trunk shortening/lengthening/rotation, while reaching (barely)out of BOS laterally with L/R hands. Also, with bil UE contact on rolling stool in front of him, trunk/ flexion/extension.  At end of session, pt seated in wc ;dtr and SIL present.  PT educated them about encouraging pt's attention to R by sitting to his R when visiting.  Pt reported that he needed to use urinal; LeeAnn, NT stated that she would help him.     Therapy Documentation Precautions:  Precautions Precautions: Fall Precaution Comments: R hemiparesis RLE>RUE Restrictions Weight Bearing Restrictions: No      Therapy/Group: Individual Therapy  Mark Jackson 01/05/2022, 12:09 PM

## 2022-01-05 NOTE — Progress Notes (Signed)
Recreational Therapy Session Note  Patient Details  Name: Mark Jackson. MRN: [id number] Date of Birth: [birth year] Today's Date: 01/05/2022  Pt participated in animal assisted activity seated EOM reaching with BUEs outside BOS to pet the therapy dog during PT session.  Pt appreciative of this visit and sharing with team about his personal dog.  Per policy, hand hygiene performed prior to and after pet interaction.  [place] 01/05/2022, 1:51 PM

## 2022-01-05 NOTE — Progress Notes (Signed)
Physical Therapy Session Note  Patient Details  Name: Mark Jackson. MRN: [id number] Date of Birth: 1930/08/03  Today's Date: 01/05/2022 PT Individual Time: 7591-6384 and 6659-9357 PT Individual Time Calculation (min): 62 min and 73 min  Short Term Goals: Week 1:  PT Short Term Goal 1 (Week 1): Pt will perform bed mobility using no bed features with CGA. PT Short Term Goal 2 (Week 1): Pt will perform all functional standing transfers with supervision and no posterior bias. PT Short Term Goal 3 (Week 1): Pt will ambulate 150 feet consistently with CGA/ supervision using LRAD. PT Short Term Goal 4 (Week 1): Pt will ambulate steps using BHR with reciprocating gait pattern and CGA.   Skilled Therapeutic Interventions/Progress Updates:  Session 1: Patient seated on EOB on entrance to room. Patient alert and agreeable to PT session. Relates  that he thinks he is wet.   Patient with no pain complaint throughout session.  Therapeutic Activity: Transfers: Patient performed sit<>stand and stand pivot transfers initially unable to rise to stand from EOB. Requires elevated height and minA for forward weight shift. Pt continuously attempts to grab for walker prior to attaining balance. Pt relates that he's "not exercising enough". With practice and activity, pt is able to improve to close supervision assist from elevated toilet seat and w/c demonstrating increased effort.  Provided verbal cues throughout for technique.  Gait Training:  Patient ambulated short in-room distances using RW with CGA/ minA for balance. Demonstrated low step height on RLE with fatigue. Provided vc/ tc for upright posture and increased step height on RLE.  Neuromuscular Re-ed: NMR facilitated during session with focus on standing balance/ tolerance during functional activity. Pt guided in midline orientation and increasing muscle activation at R quad for improved posture and decreased lean to R. Stands for increased time  at sink to perform morning routine. Washes face, wets hair, brushes hair, brushes teeth, washes UB, applies deodorant for 72mn prior to sitting. Second standing bout to don new undershirt and begin to shave using electric razor. Completes shaving while seated. Demos improved activity tolerance. Is able to button shirt demonstrating improved fine motor manipulation. NMR performed for improvements in motor control and coordination, balance, sequencing, judgement, and self confidence/ efficacy in performing all aspects of mobility at highest level of independence.   Patient seated  in w/c at end of session with brakes locked, belt alarm set, and all needs within reach.   Session 2: Patient seated upright in w/c on entrance to room. Patient alert and agreeable to PT session.   Patient with no pain complaint throughout session.  Therapeutic Activity: Transfers: Patient performed sit<>stand and stand pivot transfers throughout session improving from MinA to close supervision with no UE support after NMR training. Provided verbal cues for technique and weight shifting.  Gait Training:  Patient ambulated ~300' x2 using RW with slow, but continuous pace. Pt notes "knee buckling" but no buckling noted. Increased fatigue with distance and decreased hip/ knee flexion with quick slide/ catch of R toe. But no LOB.   Neuromuscular Re-ed: NMR facilitated during session with focus on standing balance. Pt guided in sit<>stand training with no UE support. Started on mat table with elevated height and height lowered following each rise to stand with decreased BLE extensor push into mat table for balance.  Pt continues to use seated surface to push against in order to rise to stand. Improves in posterior bias prior to stance. Able to perform with supervision with extensor  push. MinA without the support of the mat table.   Pt guided in seated concentric LAQs and marches with 5sec hold then slow eccentric return for  BLE. Slow to perform with RLE. NMR performed for improvements in motor control and coordination, balance, sequencing, judgement, and self confidence/ efficacy in performing all aspects of mobility at highest level of independence.    Patient seated upright in w/c at end of session with brakes locked, no alarm set as tray table set in front of pt and OT coming to start session shortly, and all needs within reach.    Therapy Documentation Precautions:  Precautions Precautions: Fall Precaution Comments: R hemiparesis RLE>RUE Restrictions Weight Bearing Restrictions: No General:   Vital Signs: Therapy Vitals Temp: 97.9 F (36.6 C) Temp Source: Oral Pulse Rate: 79 Resp: 16 BP: (!) 159/78 Patient Position (if appropriate): Lying Oxygen Therapy SpO2: 97 % O2 Device: Room Air Pain: Pain Assessment Pain Scale: 0-10 Pain Score: 0-No pain  Therapy/Group: Individual Therapy  Alger Simons 01/05/2022, 7:52 AM

## 2022-01-06 DIAGNOSIS — I6381 Other cerebral infarction due to occlusion or stenosis of small artery: Secondary | ICD-10-CM | POA: Diagnosis not present

## 2022-01-06 MED ORDER — SODIUM CHLORIDE 0.45 % IV SOLN: INTRAVENOUS | Status: DC

## 2022-01-06 NOTE — Progress Notes (Signed)
Recreational Therapy Session Note  Patient Details  Name: Mark Jackson. MRN: [id number] Date of Birth: [birth year] Today's Date: 01/06/2022  Pain: no c/o Skilled Therapeutic Interventions/Progress Updates: Goal:  Pt will maintain dynamic standing balance with Min assist without AD.  MET  Session focused on activity tolerance, standing tolerance, dynamic standing balance during co-treat with PT.  Pt stood without UE support for ball toss activity requiring contact guard assist- min assist for balance, instructional cues for posture and not leaning onto seating surface with LEs.  Pt ambulated with contact guard assist from therapy gym back to his room using RW.  Pt joking with staff throughout session.  Therapy/Group: Co-Treatment  Shantanique Hodo 01/06/2022, 3:46 PM

## 2022-01-06 NOTE — Progress Notes (Signed)
Physical Therapy Session Note  Patient Details  Name: Mark Jackson. MRN: [id number] Date of Birth: 12-Jan-1930  Today's Date: 01/06/2022 PT Individual Time: 0902-1001 PT Individual Time Calculation (min): 59 min   Short Term Goals: Week 1:  PT Short Term Goal 1 (Week 1): Pt will perform bed mobility using no bed features with CGA. PT Short Term Goal 2 (Week 1): Pt will perform all functional standing transfers with supervision and no posterior bias. PT Short Term Goal 3 (Week 1): Pt will ambulate 150 feet consistently with CGA/ supervision using LRAD. PT Short Term Goal 4 (Week 1): Pt will ambulate steps using BHR with reciprocating gait pattern and CGA.  Skilled Therapeutic Interventions/Progress Updates:  Patient seated EOB on entrance to room. Patient alert and agreeable to PT session.   Patient with no pain complaint throughout session.  Therapeutic Activity: Transfers: Patient performed sit<>stand and stand pivot transfers throughout session with difficulty initially with sit<.stand from EOB requiring elevated bed surface and MinA to power up and to attain standing balance. Improves to supervision from w/c with use of BUE push from armrests. Provided verbal cues for technique during NMR training.  Gait Training:  Patient ambulated short distances in room as well as ~150 ft using RW with CGA/ supervision. Demonstrated intermittent stub of R toe during swing phase. Provided vc/ tc for increasing RLE step height.  Neuromuscular Re-ed: NMR facilitated during session with focus on standing balance and sit<>stand training with no AD. Pt guided in NDT cueing for improving foot positioning, forward lean for appropriate weight shift and full extensor push with BLE without use of seated surface to stand. Requires MinA initially with hand placement on knees and pt demonstrating momentum build for forward weight shift. Requires continued cueing for appropriate forward lean. Improves to light  CGA for rise to stand and supervision for descent to sit.   Also guided in proactive/ reactive balance training with +2 for bounce catch of t-ball. Pt requires CGA throughout with intermittent MinA to maintain balance d/t posterior LOB and noted lift of toes from floor. NMR performed for improvements in motor control and coordination, balance, sequencing, judgement, and self confidence/ efficacy in performing all aspects of mobility at highest level of independence.   Patient seated upright  in w/c at end of session with brakes locked, belt alarm set, and all needs within reach.  Therapy Documentation Precautions:  Precautions Precautions: Fall Precaution Comments: R hemiparesis RLE>RUE Restrictions Weight Bearing Restrictions: No General:   Vital Signs: Therapy Vitals Temp: 97.6 F (36.4 C) Pulse Rate: 74 Resp: 18 BP: (!) 148/67 Patient Position (if appropriate): Lying Oxygen Therapy SpO2: 98 % O2 Device: Room Air Pain:  No pain complaint this session.   Therapy/Group: Individual Therapy  Alger Simons PT, DPT 01/06/2022, 7:54 AM

## 2022-01-06 NOTE — Progress Notes (Signed)
Occupational Therapy Weekly Progress Note  Patient Details  Name: Mark Jackson. MRN: [id number] Date of Birth: [birth year]  Beginning of progress report period: Dec 30, 2021 End of progress report period: Jan 06, 2022  Today's Date: 01/06/2022 OT Individual Time: 0700-0818 OT Individual Time Calculation (min): 78 min + 48 min    Patient has met 4 of 4 short term goals.  Pt has made slow, but steady progress this week towards LTG. Pt has demonstrated improved static/dynamic sitting and standing balance, activity tolerance, improved R lean/trunk control, ability to come into standing to presently complete seated UB bathing and dressing with mod I/set-up A, LB ADL with min A for balance, and ambulatory toilet transfers with CGA and use of RW.  Pt cont to be primarily limited by decreased activity tolerance/endurance deficits, frequent urinary incontinence, impaired balance, generalized weakness, and mild R HP. Anticipate intermittent S for LB ADL and mobility required upon DC.   Patient continues to demonstrate the following deficits: muscle weakness, decreased cardiorespiratoy endurance, impaired timing and sequencing, unbalanced muscle activation, and decreased coordination, and decreased standing balance, decreased postural control, decreased balance strategies, and mild R HP  and therefore will continue to benefit from skilled OT intervention to enhance overall performance with BADL, iADL, and Reduce care partner burden.  Patient progressing toward long term goals..  Continue plan of care.  OT Short Term Goals Week 1:  OT Short Term Goal 1 (Week 1): Pt will maintain static sitting balance EOB with S to complete ADL. OT Short Term Goal 1 - Progress (Week 1): Met OT Short Term Goal 2 (Week 1): Pt will don pants with mod A. OT Short Term Goal 2 - Progress (Week 1): Met OT Short Term Goal 3 (Week 1): Pt will complete toilet transfer with mod A and LRAD. OT Short Term Goal 3 - Progress  (Week 1): Met OT Short Term Goal 4 (Week 1): Pt will complete standing grooming task at sink with min A. OT Short Term Goal 4 - Progress (Week 1): Met  Skilled Therapeutic Interventions/Progress Updates:    Session 1 [phone]) : Pt received asleep in bed, easily awakened to voice, agreeable to therapy with increased time to rouse. Denies pain. Reports having been incontinent of bladder and unable to make it to use urinal. Session focus on self-care retraining, activity tolerance, transfer retraining in prep for improved ADL/IADL/func mobility performance + decreased caregiver burden. Came to sitting EOB with increased time and close S from flat bed. Close S for seated balance EOB. After rest break, pt able to come into standing with 2 attempts and CGA due to R lean during transition. Close S with RW > w/c at sink and increased time. Completed grooming and UB bathing with mod I while seated at sink, CGA to light min A for balance during standing pericare and use of urinal. Ambulatory toilet transfer with CGA due to flexed posture and urgency + RW. Continent void of BM and close S during seated pericare.  Seated at sink, completed UBD with increased time and distant S due to fatigue. Donned incontinence brief and pants with overall min A for balance due to R lean in standing without BUE support.   MD in/out morning rounds.  Pt noted to have own Tylenol bottle in his pocket - LPN made aware.   Pt left seated in w/c with safety belt alarm engaged, call bell in reach, and all immediate needs met.    Session 2 [phone]): Pt  received seated in w/c with daughter present, later reports ongoing L chronic knee pain but, agreeable to therapy. Session focus on self-care retraining, activity tolerance, R NMR, transfer retraining in prep for improved ADL/IADL/func mobility performance + decreased caregiver burden.  Ambulated > toilet with RW and CGA, cues for upright posture and to look ahead. Stood to  urinate in urinal with CGA for balance throughout. Ambulated to and from gym with similar assist level.  Pt completed the following games on BITS to target RUE NMR and static standing balance without UE support.  Game: Single Target Accuracy: 87.93% Reaction Time: 2.33 seconds Duration Time: 2 min Hits: 51 hits  Accuracy: 100% Reaction Time: 3.13  seconds Duration Time: 2 min Hits: 38 hits  No LOB throughout. Pt reports his RUE/RLE feel at "90%" compared to 100% prior to CVA.  Pt completed 15 continuous minutes on nustep to target improved CV health and RUE/RLE NMR at 5-6/10 level resistance with an average step/min at 72. Pt reports 0/10 on modified RPE post activity. Additionally reviewed CVA signs/sx, referred to [employer] in [facility] binder.  Pt left seated in w/c with safety belt alarm engaged, call bell in reach, and all immediate needs met.    Therapy Documentation Precautions:  Precautions Precautions: Fall Precaution Comments: R hemiparesis RLE>RUE Restrictions Weight Bearing Restrictions: No  Pain: denies   ADL: See Care Tool for more details.   Therapy/Group: Individual Therapy  Volanda Napoleon MS, OTR/L  01/06/2022, 6:55 AM

## 2022-01-06 NOTE — Progress Notes (Signed)
PROGRESS NOTE   Subjective/Complaints:  No new issues , pt states he drank 2 large ice teas from [facility] yesterday , urine output reflects this but not intake log  ROS-   Pt denies SOB, abd pain, CP, N/V/C/D, and vision changes   Objective:   No results found. Recent Labs    01/05/22 0559  WBC 10.5  HGB 9.8*  HCT 29.6*  PLT 195     Recent Labs    01/05/22 0559  NA 140  K 4.5  CL 117*  CO2 19*  GLUCOSE 94  BUN 72*  CREATININE 2.97*  CALCIUM 8.2*      Intake/Output Summary (Last 24 hours) at 01/06/2022 0803 Last data filed at 01/06/2022 0715 Gross per 24 hour  Intake 240 ml  Output 1550 ml  Net -1310 ml         Physical Exam: Vital Signs Blood pressure (!) 148/67, pulse 74, temperature 97.6 F (36.4 C), resp. rate 18, height '5\' 8"'$  (1.727 m), weight 83.4 kg, SpO2 98 %.   General: No acute distress Mood and affect are appropriate Heart: Regular rate and rhythm no rubs murmurs or extra sounds Lungs: Clear to auscultation, breathing unlabored, no rales or wheezes Abdomen: Positive bowel sounds, soft nontender to palpation, nondistended Extremities: No clubbing, cyanosis, or edema   Skin: No evidence of breakdown, no evidence of rash    Assessment/Plan: 1. Functional deficits which require 3+ hours per day of interdisciplinary therapy in a comprehensive inpatient rehab setting. Physiatrist is providing close team supervision and 24 hour management of active medical problems listed below. Physiatrist and rehab team continue to assess barriers to discharge/monitor patient progress toward functional and medical goals  Care Tool:  Bathing    Body parts bathed by patient: Front perineal area, Buttocks, Face   Body parts bathed by helper: Buttocks (feet, back)     Bathing assist Assist Level: Minimal Assistance - Patient > 75%     Upper Body Dressing/Undressing Upper body dressing   What is  the patient wearing?: Button up shirt, Pull over shirt    Upper body assist Assist Level: Set up assist    Lower Body Dressing/Undressing Lower body dressing      What is the patient wearing?: Pants, Underwear/pull up     Lower body assist Assist for lower body dressing: Minimal Assistance - Patient > 75%     Toileting Toileting    Toileting assist Assist for toileting: Supervision/Verbal cueing Assistive Device Comment: RW   Transfers Chair/bed transfer  Transfers assist     Chair/bed transfer assist level: Minimal Assistance - Patient > 75%     Locomotion Ambulation   Ambulation assist      Assist level: Minimal Assistance - Patient > 75% Assistive device: Walker-rolling Max distance: 43 ft   Walk 10 feet activity   Assist     Assist level: Minimal Assistance - Patient > 75% Assistive device: Walker-rolling   Walk 50 feet activity   Assist Walk 50 feet with 2 turns activity did not occur: Safety/medical concerns         Walk 150 feet activity   Assist Walk 150 feet activity  did not occur: Safety/medical concerns         Walk 10 feet on uneven surface  activity   Assist Walk 10 feet on uneven surfaces activity did not occur: Safety/medical concerns         Wheelchair     Assist Is the patient using a wheelchair?: Yes Type of Wheelchair: Manual    Wheelchair assist level: Total Assistance - Patient < 25% Max wheelchair distance: 250 ft    Wheelchair 50 feet with 2 turns activity    Assist        Assist Level: Total Assistance - Patient < 25%   Wheelchair 150 feet activity     Assist      Assist Level: Total Assistance - Patient < 25%   Blood pressure (!) 148/67, pulse 74, temperature 97.6 F (36.4 C), resp. rate 18, height '5\' 8"'$  (1.727 m), weight 83.4 kg, SpO2 98 %.  Medical Problem List and Plan:likely cardioembolic  1. Functional deficits secondary to left thalamic infarction and small left  cerebellar infarct             -patient may shower             -ELOS/Goals: 01/13/22  mod I to supervision goals initial evals today -discussed with pt   Con't CIR- PT, OT and SLP 2.  Antithrombotics: -DVT/anticoagulation:  Pharmaceutical: Other (comment) Eliquis             -antiplatelet therapy: N/A 3. Pain Management: Tylenol as needed             -add voltaren gel QID for endstage OA left knee  5/27- pain just "there"- doing OK- will con't voltaren gel 4. Mood: Provide emotional support             -antipsychotic agents: N/A 5. Neuropsych: This patient is capable of making decisions on his own behalf. 6. Skin/Wound Care: Routine skin checks 7. Fluids/Electrolytes/Nutrition: Routine in and outs with follow-up chemistries 8.  Atrial fibrillation.  Cardizem 120 mg daily.  Continue Eliquis.  Cardiac rate controlled at present Vitals:   01/05/22 1931 01/06/22 0454  BP: (!) 144/65 (!) 148/67  Pulse: 67 74  Resp: 18 18  Temp: 97.7 F (36.5 C) 97.6 F (36.4 C)  SpO2: 99% 98%   Elevated systolic with HR in 20U will increase cardizem to '180mg'$   9.  Hyperlipidemia.  Crestor 10.  Diastolic congestive heart failure.  Lasix 20 mg daily.  Monitor for any signs of fluid overload             -daily weights Filed Weights   12/29/21 1338 01/06/22 0454  Weight: 83 kg 83.4 kg    5/27- made sure has daily weights- will monitor- 5/28- ordered- will double check with nursing- don't se ein computer  11.  CAD/stenting/TAVR.  Continue Imdur 60 mg daily 12.  CKD stage IV.  Check BMET 5/31    Latest Ref Rng & Units 01/05/2022    5:59 AM 12/30/2021    5:32 AM 12/28/2021    1:52 AM  BMP  Glucose 70 - 99 mg/dL 94   106   105    BUN 8 - 23 mg/dL 72   57   56    Creatinine 0.61 - 1.24 mg/dL 2.97   2.81   2.69    Sodium 135 - 145 mmol/L 140   138   141    Potassium 3.5 - 5.1 mmol/L 4.5   4.4   4.4  Chloride 98 - 111 mmol/L 117   113   116    CO2 22 - 32 mmol/L '19   20   20    '$ Calcium 8.9 - 10.3 mg/dL  8.2   8.1   8.3      13.  History of migraine headaches.  Topamax 150 mg nightly 14.  Chronic anemia.      Latest Ref Rng & Units 01/05/2022    5:59 AM 12/30/2021    5:32 AM 12/28/2021    1:52 AM  CBC  WBC 4.0 - 10.5 K/uL 10.5   11.6   10.3    Hemoglobin 13.0 - 17.0 g/dL 9.8   10.0   9.9    Hematocrit 39.0 - 52.0 % 29.6   29.5   30.8    Platelets 150 - 400 K/uL 195   127   120      15.  BPH with history of prostate cancer.  Check PVR and follow for patterns. Pt denies any emptying issues at present    LOS: 8 days A FACE TO [place] [facility] Sanii Kukla 01/06/2022, 8:03 AM

## 2022-01-06 NOTE — Progress Notes (Signed)
Physical Therapy Weekly Progress Note  Patient Details  Name: Mark Jackson. MRN: [id number] Date of Birth: 02-Dec-1929  Beginning of progress report period: Dec 30, 2021 End of progress report period: Jan 06, 2022  Today's Date: 01/06/2022 PT Individual Time: 1138-1210 PT Individual Time Calculation (min): 32 min   Patient has met 1 of 4 short term goals.  Pt is quickly progressing toward the other 3 out of 4 goals. He is on track to meet LTG. He is motivated to participate in therapy sessions. He completes bed mobility with CGA and use of bed rails, sit<>stand and stand pivot transfers using RW with MinA and max cues for positioning and technque in the morning up to CGA/supervision after mobilizing, and he's ambulating >232f with CGA and RW. He continues to be primarily limited by posterior bias in standing, decreased dynamic standing balance, RUE and RLE weakness, and gait impairments.    Patient continues to demonstrate the following deficits muscle weakness, decreased cardiorespiratoy endurance, impaired timing and sequencing and unbalanced muscle activation, decreased motor planning, decreased initiation and decreased safety awareness, and decreased standing balance and decreased balance strategies and therefore will continue to benefit from skilled PT intervention to increase functional independence with mobility.  Patient progressing toward long term goals..  Continue plan of care.  PT Short Term Goals Week 1:  PT Short Term Goal 1 (Week 1): Pt will perform bed mobility using no bed features with CGA. PT Short Term Goal 1 - Progress (Week 1): Progressing toward goal PT Short Term Goal 2 (Week 1): Pt will perform all functional standing transfers with supervision and no posterior bias. PT Short Term Goal 2 - Progress (Week 1): Progressing toward goal PT Short Term Goal 3 (Week 1): Pt will ambulate 150 feet consistently with CGA/ supervision using LRAD. PT Short Term Goal 3 -  Progress (Week 1): Met PT Short Term Goal 4 (Week 1): Pt will ambulate steps using BHR with reciprocating gait pattern and CGA. PT Short Term Goal 4 - Progress (Week 1): Progressing toward goal Week 2:  PT Short Term Goal 1 (Week 2): Pt will perform bed mobility using no bed features with supervision. PT Short Term Goal 2 (Week 2): Pt will perform all functional standing transfers with supervision and no posterior bias. PT Short Term Goal 3 (Week 2): Pt will ambulate >200 feet consistently with  supervision using LRAD. PT Short Term Goal 4 (Week 2): Pt will ambulate steps using BHR with reciprocating gait pattern and CGA.  Skilled Therapeutic Interventions/Progress Updates:  Patient seated upright in w/c on entrance to room. Patient alert and agreeable to PT session. Requests to urinate with urinal. Pt attempts to setup for use of urinal while seated in w/c. Unable to problem solve and requires vc for attempting during stance. Requires CGA for maintaining balance without UE support in order to use urinal successfully.   Patient with no pain complaint throughout session.  Therapeutic Activity: Transfers: Patient performed sit<>stand and stand pivot transfers throughout session with CGA/ MinA with no UE use but able to transfer with supervision with use of BUE for push-to-stand technique. Provided verbal cues for decreasing BLE extensor push into seated surface.   Neuromuscular Re-ed: NMR facilitated during session with focus on muscle fiber facilitation and standing balance. Pt guided in toe touches to called step at practice stairs. Pt is able to touch R toe to first step and requires vc to prevent slide of foot from step. Able to hold  R knee extension during forced use of RLE with toe touches of LLE to first and/ or second steps. NMR performed for improvements in motor control and coordination, balance, sequencing, judgement, and self confidence/ efficacy in performing all aspects of mobility at  highest level of independence.   Gait Training:  Patient ambulated short in-room distances as well as >200 ft x2 using RW with CGA. Pt with increased focus on clearing RLE. Pt is also able to ambulate up/ down ramp in ortho gyn with supervision. Provided vc/ tc for maintaining control of RW as well as clearing RLE.  Patient seated  in w/c at end of session with brakes locked, belt alarm set, and all needs within reach.   Therapy Documentation Precautions:  Precautions Precautions: Fall Precaution Comments: R hemiparesis RLE>RUE Restrictions Weight Bearing Restrictions: No General:   Vital Signs:  Pain: Pain Assessment Pain Scale: 0-10 Pain Score: 0-No pain  Therapy/Group: Individual Therapy  Alger Simons PT, DPT 01/06/2022, 10:33 AM

## 2022-01-07 ENCOUNTER — Institutional Professional Consult (permissible substitution): Payer: Medicare Other | Admitting: Adult Health

## 2022-01-07 NOTE — Progress Notes (Signed)
Patient ID: Mark Jackson., male   DOB: 06/15/1930, 88 y.o.   MRN: [id number] Team Conference Report to Patient/Family  Team Conference discussion was reviewed with the patient and caregiver, including goals, any changes in plan of care and target discharge date.  Patient and caregiver express understanding and are in agreement.  The patient has a target discharge date of 01/13/22.  Sw spoke with patient daughter,Alicia and provided team conference updates. Sw working with daughter to schedule education so that other siblings are able to attend. Sw will wait for family follow up. No additional questions or concerns, sw will continue to follow up.  Dyanne Iha 01/07/2022, 2:22 PM

## 2022-01-07 NOTE — Progress Notes (Signed)
Patient ID: Mark Jackson., male   DOB: [birth year], 88 y.o.   MRN: [id number]  [facility] ordered through [employer]

## 2022-01-07 NOTE — Progress Notes (Signed)
Occupational Therapy Session Note  Patient Details  Name: Mark Jackson. MRN: [id number] Date of Birth: 14-Aug-1929  Today's Date: 01/07/2022 OT Individual Time: 0701-0802 OT Individual Time Calculation (min): 61 min    Short Term Goals: Week 1:  OT Short Term Goal 1 (Week 1): Pt will maintain static sitting balance EOB with S to complete ADL. OT Short Term Goal 1 - Progress (Week 1): Met OT Short Term Goal 2 (Week 1): Pt will don pants with mod A. OT Short Term Goal 2 - Progress (Week 1): Met OT Short Term Goal 3 (Week 1): Pt will complete toilet transfer with mod A and LRAD. OT Short Term Goal 3 - Progress (Week 1): Met OT Short Term Goal 4 (Week 1): Pt will complete standing grooming task at sink with min A. OT Short Term Goal 4 - Progress (Week 1): Met Week 2:  OT Short Term Goal 2 (Week 2): STG = LTG 2/2 ELOS  Skilled Therapeutic Interventions/Progress Updates:    Pt received asleep in bed, awakened to voice, agreeable to therapy with encouragement and increased time to rouse. Session focus on self-care retraining, activity tolerance, transfer retraining in prep for improved ADL/IADL/func mobility performance + decreased caregiver burden.  Came to sitting EOB with close S, but continues to require increased time to exit bed. Total A to don B teds bed level. Reports having been incontinent of bladder and additional need to void. Sit to stand from EOB throughout ADL with min A to overcome posterior bias and consistent min A for balance in standing during pericare and using urinal due to R lean. Does improve to CGA/close S.  Stood to Administrator with CGA for balance. Donned underwear/pants with CGA for balance. Distant S for donning and tying shoes EOB via figure 4 position. Stood at sink to complete oral care and grooming, one LOB to his R with min A required to correct.  Cues throughout for energy conservation strategies and sit to stand technique to decrease falls risk.   Pt left  seated in w/c with chair pad alarm engaged, call bell in reach, and all immediate needs met.    Therapy Documentation Precautions:  Precautions Precautions: Fall Precaution Comments: R hemiparesis RLE>RUE Restrictions Weight Bearing Restrictions: No  Pain: chronic L knee pain, did not rate   ADL: See Care Tool for more details.  Therapy/Group: Individual Therapy   Volanda Napoleon MS, OTR/L  01/07/2022, 6:51 AM

## 2022-01-07 NOTE — Progress Notes (Signed)
Physical Therapy Session Note  Patient Details  Name: Mark Jackson. MRN: [id number] Date of Birth: 09-08-1929  Today's Date: 01/07/2022 PT Individual Time: [phone] and 1606-1700  71 min and 54 min   Short Term Goals: Week 1:  PT Short Term Goal 1 (Week 1): Pt will perform bed mobility using no bed features with CGA. PT Short Term Goal 1 - Progress (Week 1): Progressing toward goal PT Short Term Goal 2 (Week 1): Pt will perform all functional standing transfers with supervision and no posterior bias. PT Short Term Goal 2 - Progress (Week 1): Progressing toward goal PT Short Term Goal 3 (Week 1): Pt will ambulate 150 feet consistently with CGA/ supervision using LRAD. PT Short Term Goal 3 - Progress (Week 1): Met PT Short Term Goal 4 (Week 1): Pt will ambulate steps using BHR with reciprocating gait pattern and CGA. PT Short Term Goal 4 - Progress (Week 1): Progressing toward goal Week 2:  PT Short Term Goal 1 (Week 2): Pt will perform bed mobility using no bed features with supervision. PT Short Term Goal 2 (Week 2): Pt will perform all functional standing transfers with supervision and no posterior bias. PT Short Term Goal 3 (Week 2): Pt will ambulate >200 feet consistently with  supervision using LRAD. PT Short Term Goal 4 (Week 2): Pt will ambulate steps using BHR with reciprocating gait pattern and CGA. Week 3:     Skilled Therapeutic Interventions/Progress Updates:  Session 1  Pt received sitting in WC and agreeable to PT. WC mobility training to force use of BUE x 155f. Sit<>stand transfers performed with RW and supervision assist and GCA without UE support. Gait training with RW 2x 2071fwith supervision assist. Gait training without AD x 1038fnd min assist from PT. Cues for weight shift and heel contact with fatigue.   Dynamic balance/gait/coordination in parallel bars:  Forward reverse gait with UE and no UE support.  3 x 8 ft il.  Side stepping R and L with BUE  supported. 3 x 8 ft bil  Step over 1 inch obstacle forward and lateral 2 x 12  Semitandem stance. 4 x 10sec hold with 1 UE support  Foot tap on 5 inch cone forward and lateral. X 5 bil with BUE and x 8 bil with 1 UE support. CGA-min assist from PT for safety with balance and gait training in parallel bars wth cues for weigh shift L and improved erect posture.   Session 2.  Pt received sitting in WC and agreeable to PT. Pt [facility] with RW x [id number] 240ft 15f therapeutic rest break between bouts. Cues for RW management on cracks in sidewalk with min assist x 1 to prevent LOB and supervidson assist for all other gait on sidewalk. Nustep reciprocal movement training x 6 min level 8>6 with RPE 8/10 at completion. Patient returned to room and left sitting [facility] reach and all needs met.        Therapy Documentation Precautions:  Precautions Precautions: Fall Precaution Comments: R hemiparesis RLE>RUE Restrictions Weight Bearing Restrictions: No   Vital Signs: Therapy Vitals Temp: (!) 97.5 F (36.4 C) Temp Source: Oral Pulse Rate: (!) 59 Resp: 18 BP: (!) 157/62 Patient Position (if appropriate): Lying Oxygen Therapy SpO2: 100 % O2 Device: Room Air Pain:  Denies throughout sessions    Therapy/Group: Individual Therapy  AustinLorie Phenix2023, 8:02 AM

## 2022-01-07 NOTE — Progress Notes (Signed)
PROGRESS NOTE   Subjective/Complaints:  No issues overnite , discussed anemia, fluids, B12  ROS-   Pt denies SOB, abd pain, CP, N/V/C/D, and vision changes   Objective:   No results found. Recent Labs    01/05/22 0559  WBC 10.5  HGB 9.8*  HCT 29.6*  PLT 195     Recent Labs    01/05/22 0559  NA 140  K 4.5  CL 117*  CO2 19*  GLUCOSE 94  BUN 72*  CREATININE 2.97*  CALCIUM 8.2*      Intake/Output Summary (Last 24 hours) at 01/07/2022 0813 Last data filed at 01/07/2022 0715 Gross per 24 hour  Intake 2341.78 ml  Output 2400 ml  Net -58.22 ml         Physical Exam: Vital Signs Blood pressure (!) 157/62, pulse (!) 59, temperature (!) 97.5 F (36.4 C), temperature source Oral, resp. rate 18, height '5\' 8"'$  (1.727 m), weight 84.3 kg, SpO2 100 %.   General: No acute distress Mood and affect are appropriate Heart: Regular rate and rhythm no rubs murmurs or extra sounds Lungs: Clear to auscultation, breathing unlabored, no rales or wheezes Abdomen: Positive bowel sounds, soft nontender to palpation, nondistended Extremities: No clubbing, cyanosis, or edema   Skin: No evidence of breakdown, no evidence of rash    Assessment/Plan: 1. Functional deficits which require 3+ hours per day of interdisciplinary therapy in a comprehensive inpatient rehab setting. Physiatrist is providing close team supervision and 24 hour management of active medical problems listed below. Physiatrist and rehab team continue to assess barriers to discharge/monitor patient progress toward functional and medical goals  Care Tool:  Bathing    Body parts bathed by patient: Front perineal area, Buttocks, Face   Body parts bathed by helper: Buttocks (feet, back)     Bathing assist Assist Level: Minimal Assistance - Patient > 75%     Upper Body Dressing/Undressing Upper body dressing   What is the patient wearing?: Button up  shirt, Pull over shirt    Upper body assist Assist Level: Supervision/Verbal cueing    Lower Body Dressing/Undressing Lower body dressing      What is the patient wearing?: Pants, Underwear/pull up     Lower body assist Assist for lower body dressing: Contact Guard/Touching assist     Toileting Toileting    Toileting assist Assist for toileting: Contact Guard/Touching assist Assistive Device Comment: RW   Transfers Chair/bed transfer  Transfers assist     Chair/bed transfer assist level: Minimal Assistance - Patient > 75%     Locomotion Ambulation   Ambulation assist      Assist level: Minimal Assistance - Patient > 75% Assistive device: Walker-rolling Max distance: 43 ft   Walk 10 feet activity   Assist     Assist level: Minimal Assistance - Patient > 75% Assistive device: Walker-rolling   Walk 50 feet activity   Assist Walk 50 feet with 2 turns activity did not occur: Safety/medical concerns         Walk 150 feet activity   Assist Walk 150 feet activity did not occur: Safety/medical concerns         Walk 10  feet on uneven surface  activity   Assist Walk 10 feet on uneven surfaces activity did not occur: Safety/medical concerns         Wheelchair     Assist Is the patient using a wheelchair?: Yes Type of Wheelchair: Manual    Wheelchair assist level: Total Assistance - Patient < 25% Max wheelchair distance: 250 ft    Wheelchair 50 feet with 2 turns activity    Assist        Assist Level: Total Assistance - Patient < 25%   Wheelchair 150 feet activity     Assist      Assist Level: Total Assistance - Patient < 25%   Blood pressure (!) 157/62, pulse (!) 59, temperature (!) 97.5 F (36.4 C), temperature source Oral, resp. rate 18, height '5\' 8"'$  (1.727 m), weight 84.3 kg, SpO2 100 %.  Medical Problem List and Plan:likely cardioembolic  1. Functional deficits secondary to left thalamic infarction and small  left cerebellar infarct             -patient may shower             -ELOS/Goals: 01/13/22  mod I to supervision goals initial evals today -discussed with pt   Con't CIR- PT, OT and SLP 2.  Antithrombotics: -DVT/anticoagulation:  Pharmaceutical: Other (comment) Eliquis             -antiplatelet therapy: N/A 3. Pain Management: Tylenol as needed             -add voltaren gel QID for endstage OA left knee  5/27- pain just "there"- doing OK- will con't voltaren gel 4. Mood: Provide emotional support             -antipsychotic agents: N/A 5. Neuropsych: This patient is capable of making decisions on his own behalf. 6. Skin/Wound Care: Routine skin checks 7. Fluids/Electrolytes/Nutrition: Routine in and outs with follow-up chemistries 8.  Atrial fibrillation.  Cardizem 120 mg daily.  Continue Eliquis.  Cardiac rate controlled at present Vitals:   01/06/22 1951 01/07/22 0425  BP: (!) 163/70 (!) 157/62  Pulse: 70 (!) 59  Resp: 18 18  Temp: 97.9 F (36.6 C) (!) 97.5 F (36.4 C)  SpO2: 100% 100%   Elevated systolic with HR in 44R will increase cardizem to '180mg'$ , monitor BP on this dose for another 1-2 d, HR running lower today , which may limit further escalation of dose , also on lasix  9.  Hyperlipidemia.  Crestor 10.  Diastolic congestive heart failure.  Lasix 20 mg daily.  Monitor for any signs of fluid overload             -daily weights Filed Weights   12/29/21 1338 01/06/22 0454 01/07/22 0500  Weight: 83 kg 83.4 kg 84.3 kg    5/27- made sure has daily weights- will monitor- 5/28- ordered- will double check with nursing- don't se ein computer  11.  CAD/stenting/TAVR.  Continue Imdur 60 mg daily 12.  CKD stage IV.  Check BMET 5/31    Latest Ref Rng & Units 01/05/2022    5:59 AM 12/30/2021    5:32 AM 12/28/2021    1:52 AM  BMP  Glucose 70 - 99 mg/dL 94   106   105    BUN 8 - 23 mg/dL 72   57   56    Creatinine 0.61 - 1.24 mg/dL 2.97   2.81   2.69    Sodium 135 - 145 mmol/L 140  138   141    Potassium 3.5 - 5.1 mmol/L 4.5   4.4   4.4    Chloride 98 - 111 mmol/L 117   113   116    CO2 22 - 32 mmol/L '19   20   20    '$ Calcium 8.9 - 10.3 mg/dL 8.2   8.1   8.3    Intake 1486m yesterday plus received IVF, will D/C IVF recheck BMET in am   13.  History of migraine headaches.  Topamax 150 mg nightly 14.  Chronic anemia.  On anticoagulation check stool OB , MCV on high side but nl B12 , pt states he was getting B12 shots as an outpatient last one was 5/16, can get next injection as OP     Latest Ref Rng & Units 01/05/2022    5:59 AM 12/30/2021    5:32 AM 12/28/2021    1:52 AM  CBC  WBC 4.0 - 10.5 K/uL 10.5   11.6   10.3    Hemoglobin 13.0 - 17.0 g/dL 9.8   10.0   9.9    Hematocrit 39.0 - 52.0 % 29.6   29.5   30.8    Platelets 150 - 400 K/uL 195   127   120      15.  BPH with history of prostate cancer.  Check PVR and follow for patterns. Pt denies any emptying issues at present    LOS: 9 days A FACE TO FACE EVALUATION WAS PERFORMED  ACharlett Blake5/31/2023, 8:13 AM

## 2022-01-08 DIAGNOSIS — M25562 Pain in left knee: Secondary | ICD-10-CM

## 2022-01-08 DIAGNOSIS — I10 Essential (primary) hypertension: Secondary | ICD-10-CM

## 2022-01-08 LAB — BASIC METABOLIC PANEL
Anion gap: 5 (ref 5–15)
BUN: 70 mg/dL — ABNORMAL HIGH (ref 8–23)
CO2: 19 mmol/L — ABNORMAL LOW (ref 22–32)
Calcium: 8.4 mg/dL — ABNORMAL LOW (ref 8.9–10.3)
Chloride: 118 mmol/L — ABNORMAL HIGH (ref 98–111)
Creatinine, Ser: 2.93 mg/dL — ABNORMAL HIGH (ref 0.61–1.24)
GFR, Estimated: 20 mL/min — ABNORMAL LOW (ref >60)
Glucose, Bld: 95 mg/dL (ref 70–99)
Potassium: 4.6 mmol/L (ref 3.5–5.1)
Sodium: 142 mmol/L (ref 135–145)
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
## 2022-01-08 NOTE — Progress Notes (Signed)
Occupational Therapy Session Note  Patient Details  Name: Mark Jackson. MRN: [id number] Date of Birth: 03-10-1930  Today's Date: 01/08/2022 OT Individual Time: 0903-1001 OT Individual Time Calculation (min): 58 min    Short Term Goals: Week 1:  OT Short Term Goal 1 (Week 1): Pt will maintain static sitting balance EOB with S to complete ADL. OT Short Term Goal 1 - Progress (Week 1): Met OT Short Term Goal 2 (Week 1): Pt will don pants with mod A. OT Short Term Goal 2 - Progress (Week 1): Met OT Short Term Goal 3 (Week 1): Pt will complete toilet transfer with mod A and LRAD. OT Short Term Goal 3 - Progress (Week 1): Met OT Short Term Goal 4 (Week 1): Pt will complete standing grooming task at sink with min A. OT Short Term Goal 4 - Progress (Week 1): Met Week 2:  OT Short Term Goal 2 (Week 2): STG = LTG 2/2 ELOS  Skilled Therapeutic Interventions/Progress Updates:    Pt received finishing on toilet with NT, ongoing L knee pain but no request for intervention at this time, agreeable to therapy. Session focus on IADL retraining, activity tolerance, RLE NMR, transfer retraining in prep for improved ADL/IADL/func mobility performance + decreased caregiver burden.  Exited bathroom with RW and CGA. Washed hands at sink with CGA/light min A due to R lean without UE support.   Ambulated throughout session with CGA to light min A, cues for bigger step length and to stay inside RW frame, upright gaze.   Pt completed low sofa transfer with min A to power up/anterior WS. Completed supine <> sit with close S in bed. Reports his bed elevates as well as the HOB/feet. Completed step-over shower transfer with light min A with use of RW/hand rail and shower stool. Cues for posterior method.   Discussed safe kitchen set-up to decrease falls risk/promote energy conservation. Pt boiled water on stove with min A for balance/ cues for safe transport of item and RW management. Required seated rest break  after retrieving pot/filling with water. Discussed having assist for IADL/ meal prep and pt verbalized understanding. He typically boils water for tea, boils eggs, and uses the microwave. Rec sitting down to eat and having a chair in the kitchen as he usually stands up to eat.  Finally, pt practiced toe taps with BLE to target RLE NMR. CGA for balance throughout.  Pt left seated in w/c with chair pad alarm engaged, call bell in reach, and all immediate needs met.    Therapy Documentation Precautions:  Precautions Precautions: Fall Precaution Comments: R hemiparesis RLE>RUE Restrictions Weight Bearing Restrictions: No  Pain: chronic L knee pain, did not rate   ADL: See Care Tool for more details.  Therapy/Group: Individual Therapy  Volanda Napoleon MS, OTR/L  01/08/2022, 6:53 AM

## 2022-01-08 NOTE — Progress Notes (Signed)
Physical Therapy Session Note  Patient Details  Name: Desi Rowe. MRN: [id number] Date of Birth: 13-May-1930  Today's Date: 01/08/2022 PT Individual Time: 1340-1450 PT Individual Time Calculation (min): 70 min   Short Term Goals: Week 1:  PT Short Term Goal 1 (Week 1): Pt will perform bed mobility using no bed features with CGA. PT Short Term Goal 1 - Progress (Week 1): Progressing toward goal PT Short Term Goal 2 (Week 1): Pt will perform all functional standing transfers with supervision and no posterior bias. PT Short Term Goal 2 - Progress (Week 1): Progressing toward goal PT Short Term Goal 3 (Week 1): Pt will ambulate 150 feet consistently with CGA/ supervision using LRAD. PT Short Term Goal 3 - Progress (Week 1): Met PT Short Term Goal 4 (Week 1): Pt will ambulate steps using BHR with reciprocating gait pattern and CGA. PT Short Term Goal 4 - Progress (Week 1): Progressing toward goal Week 2:  PT Short Term Goal 1 (Week 2): Pt will perform bed mobility using no bed features with supervision. PT Short Term Goal 2 (Week 2): Pt will perform all functional standing transfers with supervision and no posterior bias. PT Short Term Goal 3 (Week 2): Pt will ambulate >200 feet consistently with  supervision using LRAD. PT Short Term Goal 4 (Week 2): Pt will ambulate steps using BHR with reciprocating gait pattern and CGA.   Skilled Therapeutic Interventions/Progress Updates:   Pt received sitting in WC and agreeable to PT. Pt reporting that he had a rough morning, but is doing better now. When asked to explain, pt stated only that the speech therapy session did not go well , but no details provided.   Gait training.   Balance Training.   Patient returned to room and left sitting in [facility] with call bell in reach and all needs met.          Therapy Documentation Precautions:  Precautions Precautions: Fall Precaution Comments: R hemiparesis RLE>RUE Restrictions Weight Bearing  Restrictions: No General:   Vital Signs: Therapy Vitals Temp: (!) 97.5 F (36.4 C) Temp Source: Oral Pulse Rate: 84 Resp: 14 BP: 138/68 Patient Position (if appropriate): Sitting Oxygen Therapy SpO2: 99 % O2 Device: Room Air Pain:   Mobility:   Locomotion :    Trunk/Postural Assessment :    Balance:   Exercises:   Other Treatments:      Therapy/Group: Individual Therapy  Lorie Phenix 01/08/2022, 3:42 PM

## 2022-01-08 NOTE — Progress Notes (Signed)
Patient ID: Mark Jackson., male   DOB: 04-04-1930, 88 y.o.   MRN: [id number]  Family eduction scheduled Sunday 1-4 PM.

## 2022-01-08 NOTE — Progress Notes (Signed)
Physical Therapy Session Note  Patient Details  Name: Mark Jackson. MRN: [id number] Date of Birth: 12-02-1929  Today's Date: 01/08/2022 PT Individual Time: 7510-2585 PT Individual Time Calculation (min): 30 min   Short Term Goals:  Week 2:  PT Short Term Goal 1 (Week 2): Pt will perform bed mobility using no bed features with supervision. PT Short Term Goal 2 (Week 2): Pt will perform all functional standing transfers with supervision and no posterior bias. PT Short Term Goal 3 (Week 2): Pt will ambulate >200 feet consistently with  supervision using LRAD. PT Short Term Goal 4 (Week 2): Pt will ambulate steps using BHR with reciprocating gait pattern and CGA.   Skilled Therapeutic Interventions/Progress Updates:  Pt received sitting in WC and agreeable to PT. Gait training through hall 2 x 116f to orthogym with supervision assist fading to min assist with fatigue on second bout with mild R lateral bias and poor step height on this day. Dynamic balance training to perform bar raise x 10 with 3 sec hold and lateral bar rotation with Bil shoulders at 45deg flexion x 5 bil. Min assist throughout due to mild R/posterior bias on this day. Sit<>stand transfers [facility] session. Pt reports need for BM on the way back to room. Pt noted to have incontinent episode on the way to toilet. Pt eft on toilet. NT aware     Therapy Documentation Precautions:  Precautions Precautions: Fall Precaution Comments: R hemiparesis RLE>RUE Restrictions Weight Bearing Restrictions: No  Vital Signs: Therapy Vitals Temp: 97.7 F (36.5 C) Temp Source: Oral Pulse Rate: 72 Resp: 16 BP: (!) 169/73 Patient Position (if appropriate): Lying Oxygen Therapy SpO2: 98 % O2 Device: Room Air Pain: denies   Therapy/Group: Individual Therapy  ALorie Phenix6/08/2021, 8:38 AM

## 2022-01-08 NOTE — Progress Notes (Signed)
PROGRESS NOTE   Subjective/Complaints:  No new concerns or complaints this AM. Trying to drink more fluids.   ROS-   Pt denies SOB, abd pain, CP, N/V/C/D, and vision changes. No fever or chills.    Objective:   No results found. No results for input(s): WBC, HGB, HCT, PLT in the last 72 hours.   Recent Labs    01/08/22 0516  NA 142  K 4.6  CL 118*  CO2 19*  GLUCOSE 95  BUN 70*  CREATININE 2.93*  CALCIUM 8.4*      Intake/Output Summary (Last 24 hours) at 01/08/2022 1610 Last data filed at 01/08/2022 0200 Gross per 24 hour  Intake 354 ml  Output 950 ml  Net -596 ml         Physical Exam: Vital Signs Blood pressure (!) 169/73, pulse 72, temperature 97.7 F (36.5 C), temperature source Oral, resp. rate 16, height '5\' 8"'$  (1.727 m), weight 82.5 kg, SpO2 98 %.   General: No acute distress, sitting in bed Mood and affect are appropriate Heart: Regular rate and rhythm no rubs murmurs or extra sounds Lungs: Clear to auscultation, breathing unlabored, no rales or wheezes Abdomen: Positive bowel sounds, soft nontender to palpation, nondistended Extremities: No clubbing, cyanosis, or edema Skin: warm and dry    Assessment/Plan: 1. Functional deficits which require 3+ hours per day of interdisciplinary therapy in a comprehensive inpatient rehab setting. Physiatrist is providing close team supervision and 24 hour management of active medical problems listed below. Physiatrist and rehab team continue to assess barriers to discharge/monitor patient progress toward functional and medical goals  Care Tool:  Bathing    Body parts bathed by patient: Front perineal area, Buttocks, Face   Body parts bathed by helper: Buttocks (feet, back)     Bathing assist Assist Level: Minimal Assistance - Patient > 75%     Upper Body Dressing/Undressing Upper body dressing   What is the patient wearing?: Pull over shirt     Upper body assist Assist Level: Supervision/Verbal cueing    Lower Body Dressing/Undressing Lower body dressing      What is the patient wearing?: Pants, Underwear/pull up     Lower body assist Assist for lower body dressing: Contact Guard/Touching assist     Toileting Toileting    Toileting assist Assist for toileting: Contact Guard/Touching assist Assistive Device Comment: RW   Transfers Chair/bed transfer  Transfers assist     Chair/bed transfer assist level: Minimal Assistance - Patient > 75%     Locomotion Ambulation   Ambulation assist      Assist level: Minimal Assistance - Patient > 75% Assistive device: Walker-rolling Max distance: 43 ft   Walk 10 feet activity   Assist     Assist level: Minimal Assistance - Patient > 75% Assistive device: Walker-rolling   Walk 50 feet activity   Assist Walk 50 feet with 2 turns activity did not occur: Safety/medical concerns         Walk 150 feet activity   Assist Walk 150 feet activity did not occur: Safety/medical concerns         Walk 10 feet on uneven surface  activity  Assist Walk 10 feet on uneven surfaces activity did not occur: Safety/medical concerns         Wheelchair     Assist Is the patient using a wheelchair?: Yes Type of Wheelchair: Manual    Wheelchair assist level: Total Assistance - Patient < 25% Max wheelchair distance: 250 ft    Wheelchair 50 feet with 2 turns activity    Assist        Assist Level: Total Assistance - Patient < 25%   Wheelchair 150 feet activity     Assist      Assist Level: Total Assistance - Patient < 25%   Blood pressure (!) 169/73, pulse 72, temperature 97.7 F (36.5 C), temperature source Oral, resp. rate 16, height '5\' 8"'$  (1.727 m), weight 82.5 kg, SpO2 98 %.  Medical Problem List and Plan:likely cardioembolic  1. Functional deficits secondary to left thalamic infarction and small left cerebellar infarct              -patient may shower             -ELOS/Goals: 01/13/22 S to mod I  Con't CIR- PT, OT and SLP 2.  Antithrombotics: -DVT/anticoagulation:  Pharmaceutical: Other (comment) Eliquis             -antiplatelet therapy: N/A 3. Pain Management: Tylenol as needed             -add voltaren gel QID for endstage OA left knee  -5/27- pain just "there"- doing OK- will con't voltaren gel  -6/1 Appears to be using PRN tylenol, continue to follow 4. Mood: Provide emotional support             -antipsychotic agents: N/A 5. Neuropsych: This patient is capable of making decisions on his own behalf. 6. Skin/Wound Care: Routine skin checks 7. Fluids/Electrolytes/Nutrition: Routine in and outs with follow-up chemistries 8.  Atrial fibrillation.  Cardizem 120 mg daily.  Continue Eliquis.  Cardiac rate controlled at present Vitals:   01/07/22 1937 01/08/22 0536  BP: (!) 149/63 (!) 169/73  Pulse: 63 72  Resp: 16 16  Temp: (!) 97.4 F (36.3 C) 97.7 F (36.5 C)  SpO2: 100% 98%   Elevated systolic with HR in 40J will increase cardizem to '180mg'$ , monitor BP on this dose for another 1-2 d, HR running lower today , which may limit further escalation of dose , also on lasix  -6/1 systolic elevated at times, continue to monitor given recent change in cardizem 9.  Hyperlipidemia.  Crestor 10.  Diastolic congestive heart failure.  Lasix 20 mg daily.  Monitor for any signs of fluid overload             -daily weights Filed Weights   01/06/22 0454 01/07/22 0500 01/08/22 0500  Weight: 83.4 kg 84.3 kg 82.5 kg    5/27- made sure has daily weights- will monitor- 5/28- ordered- will double check with nursing- don't se ein computer  11.  CAD/stenting/TAVR.  Continue Imdur 60 mg daily 12.  CKD stage IV.  Check BMET 5/31    Latest Ref Rng & Units 01/08/2022    5:16 AM 01/05/2022    5:59 AM 12/30/2021    5:32 AM  BMP  Glucose 70 - 99 mg/dL 95   94   106    BUN 8 - 23 mg/dL 70   72   57    Creatinine 0.61 - 1.24 mg/dL 2.93    2.97   2.81    Sodium  135 - 145 mmol/L 142   140   138    Potassium 3.5 - 5.1 mmol/L 4.6   4.5   4.4    Chloride 98 - 111 mmol/L 118   117   113    CO2 22 - 32 mmol/L '19   19   20    '$ Calcium 8.9 - 10.3 mg/dL 8.4   8.2   8.1    Intake 1477m yesterday plus received IVF, will D/C IVF recheck BMET in am  -6/1 Cr stable this am, continue to monitor  13.  History of migraine headaches.  Topamax 150 mg nightly 14.  Chronic anemia.  On anticoagulation check stool OB , MCV on high side but nl B12 , pt states he was getting B12 shots as an outpatient last one was 5/16, can get next injection as OP     Latest Ref Rng & Units 01/05/2022    5:59 AM 12/30/2021    5:32 AM 12/28/2021    1:52 AM  CBC  WBC 4.0 - 10.5 K/uL 10.5   11.6   10.3    Hemoglobin 13.0 - 17.0 g/dL 9.8   10.0   9.9    Hematocrit 39.0 - 52.0 % 29.6   29.5   30.8    Platelets 150 - 400 K/uL 195   127   120      15.  BPH with history of prostate cancer.  Check PVR and follow for patterns. Pt denies any emptying issues at present    LOS: 10 days A FACE TO FACE EVALUATION WAS PERFORMED  YJennye Boroughs6/08/2021, 7:22 AM

## 2022-01-09 DIAGNOSIS — I509 Heart failure, unspecified: Secondary | ICD-10-CM

## 2022-01-09 MED ORDER — DILTIAZEM HCL ER COATED BEADS 120 MG PO CP24
240.0000 mg | ORAL_CAPSULE | Freq: Every day | ORAL | Status: DC
  Administered 2022-01-10 – 2022-01-13 (×4): 240 mg via ORAL
  Filled 2022-01-09 (×4): qty 2

## 2022-01-09 NOTE — Progress Notes (Addendum)
PROGRESS NOTE   Subjective/Complaints:  Reports he feels well overall. Feels like he may have BM soon.    ROS-   Pt denies SOB, abd pain, CP, N/V/C/D, and vision changes. No HA   Objective:   No results found. No results for input(s): WBC, HGB, HCT, PLT in the last 72 hours.   Recent Labs    01/08/22 0516  NA 142  K 4.6  CL 118*  CO2 19*  GLUCOSE 95  BUN 70*  CREATININE 2.93*  CALCIUM 8.4*      Intake/Output Summary (Last 24 hours) at 01/09/2022 0725 Last data filed at 01/09/2022 0449 Gross per 24 hour  Intake 594 ml  Output 1250 ml  Net -656 ml         Physical Exam: Vital Signs Blood pressure (!) 162/70, pulse 69, temperature 98 F (36.7 C), resp. rate 18, height '5\' 8"'$  (1.727 m), weight 83.1 kg, SpO2 100 %.   General: No acute distress, sitting in bed Mood and affect are appropriate Heart: Regular rate and rhythm no rubs murmurs or extra sounds Lungs: Clear to auscultation, breathing unlabored, no rales or wheezes Abdomen: Positive bowel sounds, soft nontender to palpation, nondistended Extremities: No clubbing, cyanosis, or edema Skin: warm and dry    Assessment/Plan: 1. Functional deficits which require 3+ hours per day of interdisciplinary therapy in a comprehensive inpatient rehab setting. Physiatrist is providing close team supervision and 24 hour management of active medical problems listed below. Physiatrist and rehab team continue to assess barriers to discharge/monitor patient progress toward functional and medical goals  Care Tool:  Bathing    Body parts bathed by patient: Front perineal area, Buttocks, Face   Body parts bathed by helper: Buttocks (feet, back)     Bathing assist Assist Level: Minimal Assistance - Patient > 75%     Upper Body Dressing/Undressing Upper body dressing   What is the patient wearing?: Pull over shirt    Upper body assist Assist Level:  Supervision/Verbal cueing    Lower Body Dressing/Undressing Lower body dressing      What is the patient wearing?: Pants, Underwear/pull up     Lower body assist Assist for lower body dressing: Contact Guard/Touching assist     Toileting Toileting    Toileting assist Assist for toileting: Contact Guard/Touching assist Assistive Device Comment: RW   Transfers Chair/bed transfer  Transfers assist     Chair/bed transfer assist level: Minimal Assistance - Patient > 75%     Locomotion Ambulation   Ambulation assist      Assist level: Minimal Assistance - Patient > 75% Assistive device: Walker-rolling Max distance: 43 ft   Walk 10 feet activity   Assist     Assist level: Minimal Assistance - Patient > 75% Assistive device: Walker-rolling   Walk 50 feet activity   Assist Walk 50 feet with 2 turns activity did not occur: Safety/medical concerns         Walk 150 feet activity   Assist Walk 150 feet activity did not occur: Safety/medical concerns         Walk 10 feet on uneven surface  activity   Assist Walk 10 feet  on uneven surfaces activity did not occur: Safety/medical concerns         Wheelchair     Assist Is the patient using a wheelchair?: Yes Type of Wheelchair: Manual    Wheelchair assist level: Total Assistance - Patient < 25% Max wheelchair distance: 250 ft    Wheelchair 50 feet with 2 turns activity    Assist        Assist Level: Total Assistance - Patient < 25%   Wheelchair 150 feet activity     Assist      Assist Level: Total Assistance - Patient < 25%   Blood pressure (!) 162/70, pulse 69, temperature 98 F (36.7 C), resp. rate 18, height '5\' 8"'$  (1.727 m), weight 83.1 kg, SpO2 100 %.  Medical Problem List and Plan:likely cardioembolic  1. Functional deficits secondary to left thalamic infarction and small left cerebellar infarct             -patient may shower             -ELOS/Goals: 01/13/22 S to  mod I  Con't CIR- PT, OT and SLP  -Fam education sunday 2.  Antithrombotics: -DVT/anticoagulation:  Pharmaceutical: Other (comment) Eliquis             -antiplatelet therapy: N/A 3. Pain Management: Tylenol as needed             -add voltaren gel QID for endstage OA left knee  -5/27- pain just "there"- doing OK- will con't voltaren gel  -6/1 Appears to be using PRN tylenol, continue to follow 4. Mood: Provide emotional support             -antipsychotic agents: N/A 5. Neuropsych: This patient is capable of making decisions on his own behalf. 6. Skin/Wound Care: Routine skin checks 7. Fluids/Electrolytes/Nutrition: Routine in and outs with follow-up chemistries 8.  Atrial fibrillation.  Cardizem 120 mg daily.  Continue Eliquis.  Cardiac rate controlled at present Vitals:   01/08/22 1953 01/09/22 0448  BP: (!) 147/72 (!) 162/70  Pulse: 66 69  Resp: 18 18  Temp: 97.9 F (36.6 C) 98 F (36.7 C)  SpO2: 99% 100%   Elevated systolic with HR in 35H will increase cardizem to '180mg'$ , monitor BP on this dose for another 1-2 d, HR running lower today , which may limit further escalation of dose , also on lasix  -6/2 Will increase cardizem to 240, HR has been stable 9.  Hyperlipidemia.  Crestor 10.  Diastolic congestive heart failure.  Lasix 20 mg daily.  Monitor for any signs of fluid overload             -daily weights  -Weight appears overall stable, continue to monitor Filed Weights   01/07/22 0500 01/08/22 0500 01/09/22 0500  Weight: 84.3 kg 82.5 kg 83.1 kg    5/27- made sure has daily weights- will monitor- 5/28- ordered- will double check with nursing- don't se ein computer  11.  CAD/stenting/TAVR.  Continue Imdur 60 mg daily 12.  CKD stage IV.  Check BMET 5/31    Latest Ref Rng & Units 01/08/2022    5:16 AM 01/05/2022    5:59 AM 12/30/2021    5:32 AM  BMP  Glucose 70 - 99 mg/dL 95   94   106    BUN 8 - 23 mg/dL 70   72   57    Creatinine 0.61 - 1.24 mg/dL 2.93   2.97   2.81  Sodium 135 - 145 mmol/L 142   140   138    Potassium 3.5 - 5.1 mmol/L 4.6   4.5   4.4    Chloride 98 - 111 mmol/L 118   117   113    CO2 22 - 32 mmol/L '19   19   20    '$ Calcium 8.9 - 10.3 mg/dL 8.4   8.2   8.1    Intake 1469m yesterday plus received IVF, will D/C IVF recheck BMET in am  -Recorded intake appears less than ideal, Creatinine was overall stable 6/1, will recheck tomorrow  13.  History of migraine headaches.  Topamax 150 mg nightly 14.  Chronic anemia.  On anticoagulation check stool OB , MCV on high side but nl B12 , pt states he was getting B12 shots as an outpatient last one was 5/16, can get next injection as OP     Latest Ref Rng & Units 01/05/2022    5:59 AM 12/30/2021    5:32 AM 12/28/2021    1:52 AM  CBC  WBC 4.0 - 10.5 K/uL 10.5   11.6   10.3    Hemoglobin 13.0 - 17.0 g/dL 9.8   10.0   9.9    Hematocrit 39.0 - 52.0 % 29.6   29.5   30.8    Platelets 150 - 400 K/uL 195   127   120      15.  BPH with history of prostate cancer.  Check PVR and follow for patterns. Pt denies any emptying issues at present    LOS: 11 days A FACE TO FACE EVALUATION WAS PERFORMED  YJennye Boroughs6/09/2021, 7:25 AM

## 2022-01-09 NOTE — Progress Notes (Signed)
Occupational Therapy Session Note  Patient Details  Name: Mark Jackson. MRN: [id number] Date of Birth: 01-10-1930  Today's Date: 01/09/2022 OT Individual Time: 7253-6644 OT Individual Time Calculation (min): 73 min + 46 min   Short Term Goals: Week 1:  OT Short Term Goal 1 (Week 1): Pt will maintain static sitting balance EOB with S to complete ADL. OT Short Term Goal 1 - Progress (Week 1): Met OT Short Term Goal 2 (Week 1): Pt will don pants with mod A. OT Short Term Goal 2 - Progress (Week 1): Met OT Short Term Goal 3 (Week 1): Pt will complete toilet transfer with mod A and LRAD. OT Short Term Goal 3 - Progress (Week 1): Met OT Short Term Goal 4 (Week 1): Pt will complete standing grooming task at sink with min A. OT Short Term Goal 4 - Progress (Week 1): Met Week 2:  OT Short Term Goal 2 (Week 2): STG = LTG 2/2 ELOS  Skilled Therapeutic Interventions/Progress Updates:    Session 1 (0347-4259): Pt received semi-reclined in bed,reports being wet, agreeable to therapy. Total A to don B teds bed level. Pt able to don Session focus on self-care retraining, activity tolerance, transfer retraining in prep for improved ADL/IADL/func mobility performance + decreased caregiver burden. Came to sitting EOB within reasonable amount of time and close S. Sit to stand and ambulated > w/c at sink with close S. Completed pericare and used urinal again with CGA to min A to correct one LOB to his R. Reports he is able to tell when he is leaning R.   Completed UB dressing and grooming with CGA to close S in standing. Completed LBD at same level and with increased time. Pt tends to over rely on BLE supported against back of chair for balance. Stood to shave with close S at sink.    Ambulated to and from gym with RW and overall close S. Issued soft yellow theraputty and pt practiced opening/closing container, pincer grasp and pull, putty squeezes, and rolling into log shape.   Pt with questions on  different fall detection devices - discussed various options (necklace vs bracelet and pros/cons, would benefit from further discussion with family.) Pt educated to keep cell phone charged on his person in event of emergency.   Pt left seated in w/c with NT present with chair pad alarm engaged, call bell in reach, and all immediate needs met.    Session 2 [phone]): Pt received seated in w/c with daughter present, no c/o pain, agreeable to therapy. Session focus on self-care retraining, activity tolerance, transfer retraining, family education in prep for improved ADL/IADL/func mobility performance + decreased caregiver burden. Family education with daughter, reviewed pt's current deficits and impact on safety/mobility/ADL/IADL performance. Emphasized need for close S with use of RW for all gait and LB ADL, particularly in the am when pt's R lean and decreased R step length is evident. Daughter verbalized understanding. Additionally reviewed safe home set-up and energy conservation strategies to decrease falls risk.  Pt ambulated to and from apartment with RW and close S. Completed low recliner transfer with CGA, supine <> sit distant S, and step over shower transfer with use of hand rail and close S.   Issued printed hand out with CVA support group flyer, [place] activities, and RUE HEP with red theraband (overhead reaches, ,elbow extension/flexion, and horizontal shoulder abduction.) Toilet transfer with close S + RW.   Pt left seated on toilet, NT/RN notified and pt  educated to call when finished, daughter present outside of room.  Therapy Documentation Precautions:  Precautions Precautions: Fall Precaution Comments: R hemiparesis RLE>RUE Restrictions Weight Bearing Restrictions: No   Pain: see session notes, no c/o throughout  ADL: See Care Tool for more details.   Therapy/Group: Individual Therapy  Volanda Napoleon MS, OTR/L  01/09/2022, 6:48 AM

## 2022-01-09 NOTE — Progress Notes (Signed)
Physical Therapy Session Note  Patient Details  Name: Mark Jackson. MRN: [id number] Date of Birth: October 29, 1929  Today's Date: 01/09/2022 PT Individual Time: 4536-4680 PT Individual Time Calculation (min): 73 min   Short Term Goals: Week 1:  PT Short Term Goal 1 (Week 1): Pt will perform bed mobility using no bed features with CGA. PT Short Term Goal 1 - Progress (Week 1): Progressing toward goal PT Short Term Goal 2 (Week 1): Pt will perform all functional standing transfers with supervision and no posterior bias. PT Short Term Goal 2 - Progress (Week 1): Progressing toward goal PT Short Term Goal 3 (Week 1): Pt will ambulate 150 feet consistently with CGA/ supervision using LRAD. PT Short Term Goal 3 - Progress (Week 1): Met PT Short Term Goal 4 (Week 1): Pt will ambulate steps using BHR with reciprocating gait pattern and CGA. PT Short Term Goal 4 - Progress (Week 1): Progressing toward goal Week 2:  PT Short Term Goal 1 (Week 2): Pt will perform bed mobility using no bed features with supervision. PT Short Term Goal 2 (Week 2): Pt will perform all functional standing transfers with supervision and no posterior bias. PT Short Term Goal 3 (Week 2): Pt will ambulate >200 feet consistently with  supervision using LRAD. PT Short Term Goal 4 (Week 2): Pt will ambulate steps using BHR with reciprocating gait pattern and CGA.  Skilled Therapeutic Interventions/Progress Updates:  Patient seated upright in w/c asleep on entrance to room. Dtr present. Patient easily roused by dtr, alert and agreeable to PT session.   Patient with no pain complaint throughout session.  Therapeutic Activity: Transfers: Patient performed sit<>stand and stand pivot transfers throughout session with supervision/ CGA and continued extensor push of posterior knees into seated surface. Provided vc/ tc for forward scoot to improve positioning and increase forward lean during rise to stand.  Gait Training:  Patient  ambulated >200 ft x2 using RW with heavy BUE lean into walker. Pt is able to decrease downward pressure with vc for "fingertip support" for short distances, but unable to maintain d/t balance and weakness. Short distance amb in therapy gym with pt using therapist's arms for BUE support. Attempt for decreased downward push, but pt not confident in balance and unable to improve posture for decreased need of BUE. Provided vc/ tc throughout for imrpoved technique and increased step height with RLE especially when fatigued.  Neuromuscular Re-ed: NMR facilitated during session with focus on standing balance. Pt guided in improving sit<>stand technique to no AD. Demonstrated correct technique to dtr. Challenged balance with arm movements for self perturbations as well as mild to mod perturbations about shoulders, chest and back. Pt continues to require MinA for balance. Introduced Airex with same arm movements and perturbations as well as introduction of 2# weighted ball for AP arm extensions and "pot stirrers". Ankle strategy noted throughout but pt continues to require Min A for posterior lean in fatigue. Educated pt and dtr on need for use of ankle strategy, then hip strategy and finally step strategy for balance. Gentle nudge of pt posteriorly to induce step strategy and pt does not engage step back to prevent potential fall.  NMR performed for improvements in motor control and coordination, balance, sequencing, judgement, and self confidence/ efficacy in performing all aspects of mobility at highest level of independence.   Patient seated upright  in w/c at end of session with brakes locked, belt alarm set, and all needs within reach. Dtr appreciative of  information gained during session re: pt's balance.   Therapy Documentation Precautions:  Precautions Precautions: Fall Precaution Comments: R hemiparesis RLE>RUE Restrictions Weight Bearing Restrictions: No General:   Vital Signs: Therapy  Vitals Temp: (!) 97.5 F (36.4 C) Temp Source: Oral Pulse Rate: 69 Resp: 17 BP: 132/66 Patient Position (if appropriate): Sitting Oxygen Therapy SpO2: 99 % O2 Device: Room Air Pain:  No complaint of pain this session.   Therapy/Group: Individual Therapy  Alger Simons PT, DPT 01/09/2022, 6:05 PM

## 2022-01-09 NOTE — Progress Notes (Signed)
Occupational Therapy Discharge Summary  Patient Details  Name: Mark Jackson. MRN: [id number] Date of Birth: 10/10/1929   Patient has met 5 of 9 long term goals due to improved activity tolerance, improved balance, postural control, ability to compensate for deficits, functional use of  RIGHT upper extremity, improved awareness, and improved coordination.  Patient to discharge at overall CGA to supervision  level.  Patient's care partner is independent to provide the necessary physical assistance at discharge.  Pt to DC at the below ADL/bathroom transfer performance level. Assist levels represent pt's most consistent performance when alert/participatory. Pt continues to be primarily limited by mild R hemiparesis, generalized deconditioning, LOB of balance to the R, and decreased activity tolerance. Family education completed on 6/2 and 6/4 with caregivers demonstrating good understanding of pt's current deficits and impact on BADL/IADL/mobility performance and recommendation for close S to CGA for gait/early morning LB ADL, and IADL. Pt and caregivers will benefit from continued OP OT to facilitate improved caregiver education, functional mobility, and occupational performance.    Reasons goals not met: Pt did not meet toileting, LBD and bathing, and home making goals initially set at mod I / S level. Pt continues to require up to CGA / min A at times in the morning for balance during toileting/LB bathing in standing due to LOB to the R without BUE support + urgency and incontinent b/b episodes. Pt performance does improve throughout the day to close S. Recommend pt use RW + UE support during these tasks and have CG provide CGA.  Recommendation:  Patient will benefit from ongoing skilled OT services in outpatient setting to continue to advance functional skills in the area of BADL, iADL, Vocation, and Reduce care partner burden.  Equipment: 3in1  Reasons for discharge: treatment goals met and  discharge from hospital  Patient/family agrees with progress made and goals achieved: Yes  OT Discharge Precautions/Restrictions  Precautions Precautions: Fall Precaution Comments: R hemiparesis RLE>RUE Restrictions Weight Bearing Restrictions: No ADL ADL Eating: Independent Where Assessed-Eating: Wheelchair Grooming: Modified independent Where Assessed-Grooming: Sitting at sink Upper Body Bathing: Modified independent Where Assessed-Upper Body Bathing: Sitting at sink Lower Body Bathing: Contact guard Where Assessed-Lower Body Bathing: Standing at sink Upper Body Dressing: Independent Where Assessed-Upper Body Dressing: Sitting at sink Lower Body Dressing: Contact guard Where Assessed-Lower Body Dressing: Standing at sink Toileting: Supervision/safety Where Assessed-Toileting: Glass blower/designer: Close supervision Toilet Transfer Method: Human resources officer: Not assessed Social research officer, government: Close supervision Social research officer, government Method: Heritage manager: Civil engineer, contracting without back, Grab bars Vision Baseline Vision/History: 1 Wears glasses (bifocals) Patient Visual Report: No change from baseline Vision Assessment?: No apparent visual deficits Perception  Perception: Within Functional Limits Praxis Praxis: Intact Cognition Cognition Overall Cognitive Status: Within Functional Limits for tasks assessed Arousal/Alertness: Awake/alert Orientation Level: Person;Place;Situation Person: Oriented Place: Oriented Situation: Oriented Memory: Impaired Memory Impairment: Decreased recall of new information Attention: Sustained Sustained Attention: Appears intact Awareness: Appears intact Problem Solving: Appears intact Safety/Judgment: Appears intact Comments: Self reports he feels "slower to process." Brief Interview for Mental Status (BIMS) Repetition of Three Words (First Attempt): 3 Temporal Orientation: Year:  Correct Temporal Orientation: Month: Accurate within 5 days Temporal Orientation: Day: correct Recall: "Sock": Yes, no cue required Recall: "Blue": Yes, no cue required Recall: "Bed": Yes, no cue required BIMS Summary Score: 15 Sensation Sensation Light Touch: Appears Intact Hot/Cold: Appears Intact Proprioception: Appears Intact Stereognosis: Appears Intact Coordination Gross Motor Movements are Fluid and Coordinated: No Fine Motor  Movements are Fluid and Coordinated: No Coordination and Movement Description: RLE>RUE hemiparesis Finger Nose Finger Test: slower on the R with mild undershooting Motor  Motor Motor: Abnormal postural alignment and control;Other (comment) Motor - Discharge Observations: RLE> RUE hemiparesis, generalized weakness but improved from eval Mobility  Bed Mobility Bed Mobility: Sit to Supine;Supine to Sit Supine to Sit: Supervision/Verbal cueing Sit to Supine: Supervision/Verbal cueing Transfers Sit to Stand: Supervision/Verbal cueing Stand to Sit: Supervision/Verbal cueing  Trunk/Postural Assessment  Cervical Assessment Cervical Assessment: Exceptions to [facility] (forward head) Thoracic Assessment Thoracic Assessment: Exceptions to [facility] (rounded shoulders) Lumbar Assessment Lumbar Assessment: Exceptions to [facility] (posterior pelvic tilt when seated EOB) Postural Control Postural Control: Deficits on evaluation (cont to demo mild R LOB without UE support in early am/with fatigue)  Balance Balance Balance Assessed: Yes Static Sitting Balance Static Sitting - Balance Support: No upper extremity supported;Feet supported Static Sitting - Level of Assistance: 7: Independent Static Standing Balance Static Standing - Balance Support: During functional activity;Bilateral upper extremity supported Static Standing - Level of Assistance: 5: Stand by assistance Dynamic Standing Balance Dynamic Standing - Balance Support: During functional activity;Bilateral upper  extremity supported Dynamic Standing - Level of Assistance: 5: Stand by assistance Extremity/Trunk Assessment RUE Assessment RUE Assessment: Within Functional Limits General Strength Comments: mild HP, but grossly WFL for BADL LUE Assessment LUE Assessment: Within Functional Limits   Volanda Napoleon MS, OTR/L  01/10/2022, 2:51 PM

## 2022-01-09 NOTE — Discharge Summary (Signed)
Physician Discharge Summary  Patient ID: Mark Jackson. MRN: [id number] DOB/AGE: 12-29-1929 88 y.o.  Admit date: 12/29/2021 Discharge date: 01/13/2022  Discharge Diagnoses:  Principal Problem:   Left thalamic infarction [facility]) DVT prophylaxis Atrial fibrillation Hyperlipidemia Diastolic congestive heart failure CAD/stenting/TAVR  CKD stage IV History of migraine headaches Chronic anemia BPH/prostate cancer  Discharged Condition: Stable  Significant Diagnostic Studies: DG Knee 1-2 Views Left  Result Date: 12/27/2021 CLINICAL DATA:  Chronic pain, previous fall EXAM: LEFT KNEE - 1-2 VIEW COMPARISON:  11/27/2019 FINDINGS: No fracture or dislocation is seen. Degenerative changes are noted with chondrocalcinosis and bony spurs. There is no significant effusion. Scattered arterial calcifications are seen in the soft tissues. IMPRESSION: No fracture or dislocation is seen in the left knee. Degenerative changes are noted with chondrocalcinosis and bony spurs. Electronically Signed   By: Elmer Picker M.D.   On: 12/27/2021 10:20   CT Head Wo Contrast  Result Date: 12/26/2021 CLINICAL DATA:  Neuro deficit stroke suspected EXAM: CT HEAD WITHOUT CONTRAST TECHNIQUE: Contiguous axial images were obtained from the base of the skull through the vertex without intravenous contrast. RADIATION DOSE REDUCTION: This exam was performed according to the departmental dose-optimization program which includes automated exposure control, adjustment of the mA and/or kV according to patient size and/or use of iterative reconstruction technique. COMPARISON:  04/07/2020 FINDINGS: Brain: No evidence of acute infarction, hemorrhage, cerebral edema, mass, mass effect, or midline shift. No hydrocephalus or acute extra-axial fluid collection. Redemonstrated left middle cranial fossa arachnoid cyst, unchanged. Vascular: No hyperdense vessel. Skull: Normal. Negative for fracture or focal lesion. Sinuses/Orbits: No  acute finding. Status post bilateral lens replacements. Other: The mastoid air cells are well aerated. IMPRESSION: No acute intracranial process. Electronically Signed   By: Merilyn Baba M.D.   On: 12/26/2021 19:40   MR ANGIO HEAD WO CONTRAST  Result Date: 12/27/2021 CLINICAL DATA:  Stroke follow-up EXAM: MRA HEAD WITHOUT CONTRAST TECHNIQUE: Angiographic images of the Circle of Willis were acquired using MRA technique without intravenous contrast. COMPARISON:  None Available. FINDINGS: POSTERIOR CIRCULATION: --Vertebral arteries: Normal --Inferior cerebellar arteries: Normal. --Basilar artery: Normal. --Superior cerebellar arteries: Normal. --Posterior cerebral arteries: Normal. ANTERIOR CIRCULATION: --Intracranial internal carotid arteries: Normal. --Anterior cerebral arteries (ACA): Normal. --Middle cerebral arteries (MCA): Normal. ANATOMIC VARIANTS: None Incidental left middle cranial fossa arachnoid cyst. IMPRESSION: Normal intracranial MRA. Electronically Signed   By: Ulyses Jarred M.D.   On: 12/27/2021 01:02   MR BRAIN WO CONTRAST  Result Date: 12/26/2021 CLINICAL DATA:  Stroke, follow up, right arm and leg weakness EXAM: MRI HEAD WITHOUT CONTRAST TECHNIQUE: Multiplanar, multiecho pulse sequences of the brain and surrounding structures were obtained without intravenous contrast. COMPARISON:  July 2022 FINDINGS: Brain: Small focus of reduced diffusion in the left thalamocapsular region. Additional punctate focus diffusion hyperintensity in the left cerebellum. Left middle cranial fossa arachnoid cyst is again noted. Prominence of the ventricles and sulci reflects similar parenchymal volume loss. Patchy and confluent areas of T2 hyperintensity in the supratentorial white matter are nonspecific but may reflect stable chronic microvascular ischemic changes. Small chronic bilateral cerebellar, left occipital, and left parietal infarcts. No intracranial mass or mass effect. No extra-axial collection. No  hydrocephalus. Vascular: Major vessel flow voids at the skull base are preserved. Skull and upper cervical spine: Normal marrow signal is preserved. Sinuses/Orbits: Paranasal sinuses are aerated. Orbits are unremarkable. Other: Sella is unremarkable.  Mastoid air cells are clear. IMPRESSION: Small acute infarct of the left thalamocapsular region. Additional more subacute  appearing small left cerebellar infarct. Chronic infarcts and chronic microvascular ischemic changes as detailed above. Electronically Signed   By: Macy Mis M.D.   On: 12/26/2021 20:45   DG Chest Portable 1 View  Result Date: 12/26/2021 CLINICAL DATA:  Altered mental status EXAM: PORTABLE CHEST 1 VIEW COMPARISON:  12/17/2020 FINDINGS: Lungs are clear. No pleural effusion or pneumothorax. Similar cardiomediastinal contours allowing for differences in technique. Post TAVR. IMPRESSION: No acute process in the chest. Electronically Signed   By: Macy Mis M.D.   On: 12/26/2021 21:21   ECHOCARDIOGRAM COMPLETE  Result Date: 12/27/2021    ECHOCARDIOGRAM REPORT   Patient Name:   Mark Jackson. Date of Exam: 12/27/2021 Medical Rec #:  [id number]          Height:       68.0 in Accession #:    [id number]         Weight:       183.6 lb Date of Birth:  07-21-1930         BSA:          1.971 m Patient Age:    70 years           BP:           162/82 mmHg Patient Gender: M                  HR:           74 bpm. Exam Location:  Inpatient Procedure: 2D Echo Indications:    stroke  History:        Patient has prior history of Echocardiogram examinations, most                 recent 02/26/2021. CAD, chronic kidney disease; Risk                 Factors:Hypertension and Dyslipidemia.                 Aortic Valve: TAVR valve is present in the aortic position.  Sonographer:    Johny Chess RDCS Referring Phys: [id number] [place]  1. Left ventricular ejection fraction, by estimation, is 55 to 60%. The left ventricle has normal  function. The left ventricle has no regional wall motion abnormalities. Left ventricular diastolic parameters are indeterminate.  2. Right ventricular systolic function is normal. The right ventricular size is normal. There is mildly elevated pulmonary artery systolic pressure.  3. Left atrial size was moderately dilated.  4. Trivial mitral valve regurgitation.  5. S/p TAVR (26 mm Sapien valve, implant date 12/12/19) Peak and mean gradients through the valve are 18 and 10 mm Hg respectively. AVA (VTI) 1.62 cm2. Compared to echo report from July 2022 mean gradient is improved (16 to 10 mm). The aortic valve has been repaired/replaced. Aortic valve regurgitation is not visualized. There is a TAVR valve present in the aortic position. FINDINGS  Left Ventricle: Left ventricular ejection fraction, by estimation, is 55 to 60%. The left ventricle has normal function. The left ventricle has no regional wall motion abnormalities. The left ventricular internal cavity size was normal in size. There is  no left ventricular hypertrophy. Left ventricular diastolic parameters are indeterminate. Right Ventricle: The right ventricular size is normal. Right vetricular wall thickness was not assessed. Right ventricular systolic function is normal. There is mildly elevated pulmonary artery systolic pressure. The tricuspid regurgitant velocity is 2.90 m/s, and with an assumed right atrial pressure of 3  mmHg, the estimated right ventricular systolic pressure is 65.0 mmHg. Left Atrium: Left atrial size was moderately dilated. Right Atrium: Right atrial size was normal in size. Pericardium: There is no evidence of pericardial effusion. Mitral Valve: There is mild thickening of the mitral valve leaflet(s). There is mild calcification of the mitral valve leaflet(s). Mild mitral annular calcification. Trivial mitral valve regurgitation. Tricuspid Valve: The tricuspid valve is normal in structure. Tricuspid valve regurgitation is trivial. Aortic  Valve: S/p TAVR (26 mm Sapien valve, implant date 12/12/19) Peak and mean gradients through the valve are 18 and 10 mm Hg respectively. AVA (VTI) 1.62 cm2. Compared to echo report from July 2022 mean gradient is improved (16 to 10 mm). The aortic valve has been repaired/replaced. Aortic valve regurgitation is not visualized. Aortic valve mean gradient measures 10.0 mmHg. Aortic valve peak gradient measures 18.1 mmHg. Aortic valve area, by VTI measures 1.67 cm. There is a TAVR valve present in the aortic position. Pulmonic Valve: The pulmonic valve was normal in structure. Pulmonic valve regurgitation is not visualized. Aorta: The aortic root and ascending aorta are structurally normal, with no evidence of dilitation. IAS/Shunts: No atrial level shunt detected by color flow Doppler.  LEFT VENTRICLE PLAX 2D LVIDd:         4.80 cm   Diastology LVIDs:         3.50 cm   LV e' medial:    5.87 cm/s LV PW:         1.10 cm   LV E/e' medial:  12.3 LV IVS:        1.10 cm   LV e' lateral:   8.70 cm/s LVOT diam:     1.70 cm   LV E/e' lateral: 8.3 LV SV:         77 LV SV Index:   39 LVOT Area:     2.27 cm  RIGHT VENTRICLE RV Basal diam:  3.00 cm RV S prime:     10.30 cm/s TAPSE (M-mode): 2.0 cm LEFT ATRIUM           Index        RIGHT ATRIUM           Index LA diam:      3.70 cm 1.88 cm/m   RA Area:     16.00 cm LA Vol (A4C): 79.9 ml 40.54 ml/m  RA Volume:   41.00 ml  20.80 ml/m  AORTIC VALVE AV Area (Vmax):    1.66 cm AV Area (Vmean):   1.62 cm AV Area (VTI):     1.67 cm AV Vmax:           213.00 cm/s AV Vmean:          149.000 cm/s AV VTI:            0.462 m AV Peak Grad:      18.1 mmHg AV Mean Grad:      10.0 mmHg LVOT Vmax:         155.67 cm/s LVOT Vmean:        106.567 cm/s LVOT VTI:          0.341 m LVOT/AV VTI ratio: 0.74  AORTA Ao Asc diam: 3.30 cm MITRAL VALVE                TRICUSPID VALVE MV Area (PHT): 4.68 cm     TR Peak grad:   33.6 mmHg MV Decel Time: 162 msec     TR Vmax:  290.00 cm/s MV E velocity:  72.40 cm/s MV A velocity: 128.00 cm/s  SHUNTS MV E/A ratio:  0.57         Systemic VTI:  0.34 m                             Systemic Diam: 1.70 cm Dorris Carnes MD Electronically signed by Dorris Carnes MD Signature Date/Time: 12/27/2021/1:09:44 PM    Final    VAS US CAROTID  Result Date: 12/29/2021 Carotid Arterial Duplex Study Patient Name:  Mark Jackson.  Date of Exam:   12/27/2021 Medical Rec #: [id number]           Accession #:    [id number] Date of Birth: Mar 24, 1930          Patient Gender: M Patient Age:   30 years Exam Location:  [facility] Procedure:      VAS US CAROTID Referring Phys: Amie [place] --------------------------------------------------------------------------------  Indications:       CVA, Carotid artery disease and Weakness. Risk Factors:      Hypertension, hyperlipidemia, coronary artery disease. Other Factors:     Atrial fibrillation, CHF, CKD IV, TAVR. Comparison Study:  Prior study done 10/30/19 indicating 60-79% right ICA stenosis                    and 1-39% left ICA stenosis. Performing Technologist: Sharion Dove RVS  Examination Guidelines: A complete evaluation includes B-mode imaging, spectral Doppler, color Doppler, and power Doppler as needed of all accessible portions of each vessel. Bilateral testing is considered an integral part of a complete examination. Limited examinations for reoccurring indications may be performed as noted.  Right Carotid Findings: +----------+--------+--------+--------+----------------------+--------+           PSV cm/sEDV cm/sStenosisPlaque Description    Comments +----------+--------+--------+--------+----------------------+--------+ CCA Prox  77      18              homogeneous                    +----------+--------+--------+--------+----------------------+--------+ CCA Distal69      22              homogeneous                    +----------+--------+--------+--------+----------------------+--------+ ICA Prox  318      69      60-79%  irregular and calcific         +----------+--------+--------+--------+----------------------+--------+ ICA Mid   263     58                                             +----------+--------+--------+--------+----------------------+--------+ ICA Distal53      16                                             +----------+--------+--------+--------+----------------------+--------+ ECA       160     21                                             +----------+--------+--------+--------+----------------------+--------+ +----------+--------+-------+--------+-------------------+  PSV cm/sEDV cmsDescribeArm Pressure (mmHG) +----------+--------+-------+--------+-------------------+ [id number]                                         +----------+--------+-------+--------+-------------------+ +---------+--------+--+--------+--+ VertebralPSV cm/s48EDV cm/s13 +---------+--------+--+--------+--+  Left Carotid Findings: +----------+--------+--------+--------+------------------+--------+           PSV cm/sEDV cm/sStenosisPlaque DescriptionComments +----------+--------+--------+--------+------------------+--------+ CCA Prox  118     25                                         +----------+--------+--------+--------+------------------+--------+ CCA Distal99      25                                         +----------+--------+--------+--------+------------------+--------+ ICA Prox  107     34      1-39%                              +----------+--------+--------+--------+------------------+--------+ ICA Distal66      19                                         +----------+--------+--------+--------+------------------+--------+ ECA       89      21                                         +----------+--------+--------+--------+------------------+--------+ +----------+--------+--------+--------+-------------------+           PSV  cm/sEDV cm/sDescribeArm Pressure (mmHG) +----------+--------+--------+--------+-------------------+ [id number]                                         +----------+--------+--------+--------+-------------------+ +---------+--------+--+--------+--+ VertebralPSV cm/s83EDV cm/s24 +---------+--------+--+--------+--+   Summary: Right Carotid: Velocities in the right ICA are consistent with a 60-79%                stenosis. Velocities have not shown a significant increase since                prior study done 10/30/19. Left Carotid: Velocities in the left ICA are consistent with a 1-39% stenosis. Vertebrals:  Bilateral vertebral arteries demonstrate antegrade flow. Subclavians: Normal flow hemodynamics were seen in bilateral subclavian              arteries. *See table(s) above for measurements and observations.  Electronically signed by Antony Contras MD on 12/29/2021 at 11:21:28 AM.    Final     Labs:  Basic Metabolic Panel: Recent Labs  Lab 01/08/22 0516 01/10/22 0716 01/12/22 0603  NA 142 142 140  K 4.6 4.7 4.8  CL 118* 116* 116*  CO2 19* 20* 20*  GLUCOSE 95 98 97  BUN 70* 68* 67*  CREATININE 2.93* 2.72* 2.77*  CALCIUM 8.4* 8.5* 8.2*    CBC: Recent Labs  Lab 01/12/22 0603  WBC 10.9*  HGB 9.5*  HCT 28.3*  MCV 100.4*  PLT 206    CBG: No  results for input(s): GLUCAP in the last 168 hours.  Family history.  Mother with ovarian cancer.  Father with migraine headaches.  Negative for colon cancer esophageal cancer stomach or rectal cancer  Brief HPI:   Mark Jackson. is a 88 y.o. right-handed male with history of CAD status post PCI/TAVR, hypertension, carotid artery disease hyperlipidemia atrial fibrillation maintained on Eliquis, CKD stage IV, diastolic congestive heart failure, chronic anemia, BPH, prostate cancer, migraine headaches and chronic back pain.  Per chart review lives alone ambulates with the use of a cane.  He works for [employer].  Presented  12/26/2021 with acute onset of right-sided weakness and mild slurred speech.  CT/MRI showed small acute infarct of the left thalamocapsular region.  Additional more subacute appearing small left cerebellar infarct.  Chronic infarcts and chronic microvascular ischemic changes.  Patient did not receive tPA.  MRA unremarkable.  Admission chemistries unremarkable except BUN 61, creatinine 2.88, troponin 23-26, echocardiogram with ejection fraction of 55 to 60% no wall motion abnormalities.  Neurology follow-up remained on Eliquis as prior to admission.  Therapy evaluations completed due to patient's right-sided weakness decreased functional mobility was admitted for a comprehensive rehab program.   Hospital Course: Mark Jackson. was admitted to rehab 12/29/2021 for inpatient therapies to consist of PT, ST and OT at least three hours five days a week. Past admission physiatrist, therapy team and rehab RN have worked together to provide customized collaborative inpatient rehab.  Pertaining to patient's left thalamic infarct and small left cerebellar infarct remained stable remained on chronic Eliquis was prior to admission.  Patient would follow-up with neurology services.  Atrial fibrillation cardiac rate controlled Cardizem as advised again patient remained on Eliquis no bleeding episodes.  His Cardizem had been adjusted to 240 mg daily.  Blood pressure remained well controlled and monitored.  History of diastolic congestive heart failure with low-dose diuretic exhibiting no signs of fluid overload.  Crestor ongoing for hyperlipidemia.  History of CAD with stenting he remained on Imdur no chest pain or shortness of breath.  CKD stage IV with latest creatinine 2.93 follow-up outpatient.  History of migraine headaches Topamax 150 mg nightly.  Chronic anemia no bleeding episodes latest hemoglobin 9.8.  History of BPH voiding without difficulty.   Blood pressures were monitored on TID basis and  controlled     Rehab course: During patient's stay in rehab weekly team conferences were held to monitor patient's progress, set goals and discuss barriers to discharge. At admission, patient required moderate assist 20 feet rolling walker minimal assist step pivot transfer  Physical exam.  Blood pressure 175/80 pulse 80 temperature 98.4 respirations 18 oxygen saturation 98% room air Constitutional.  No acute distress HEENT Head.  Normocephalic and atraumatic Eyes.  Pupils round and reactive to light no discharge.nystagmus Neck.  Supple nontender no JVD without thyromegaly Cardiac normal rate regular rhythm no murmur Abdomen.  Soft nontender positive bowel sounds without rebound Respiratory effort normal no respiratory distress without wheezes Skin.  Warm and dry Musculoskeletal. Comments.  Right knee with/TKA scar.  Left knee with mild effusion chronic changes positive crepitus tender with active and passive range of motion Neurologic.  Awake alert mildly dysarthric fully intelligible oriented x3 with reasonable insight and awareness.  No acute CN findings.  Right limb ataxia/past-pointing.  Right lower extremity mild ataxia.  Right upper extremity 4/5 left upper extremity 4+/5 right lower extremity 4/5 proximal to distal left lower extremity 4/5 as well as pain  inhibition at left knee.  No focal sensory findings  He/She  has had improvement in activity tolerance, balance, postural control as well as ability to compensate for deficits. He/She has had improvement in functional use RUE/LUE  and RLE/LLE as well as improvement in awareness.  Ambulating 120 feet x 2 with supervision assist fading to minimal assist with fatigue.  Working with dynamic standing balance.  Sit to stand transfers with supervision.  Ambulates to the bathroom rolling walker contact-guard.  Washes hands at sink with contact-guard.  Ambulated throughout ADL sessions contact-guard to light minimal assist.  Completed low sofa  transfers minimal assist to power up/anterior WS.  Completed supine to sit with close supervision in bed.  Discussed safe kitchen set up to decrease fall risk promote energy conservation.  Follow-up speech therapy monitoring any cognition changes oriented to person place time.  There was discussed with family need for supervision for safety.  Full family teaching completed plan discharge to home       Disposition: Discharged home    Diet: Regular  Special Instructions: No driving smoking or alcohol  Medications at discharge 1.  Tylenol as needed 2.  Eliquis 2.5 mg p.o. twice daily 3.  Voltaren gel 2 g 4 times daily 4.  Cardizem CD 240 mg daily 5.  Zetia 10 mg p.o. daily 6.  Lasix 20 mg p.o. daily 7.  Imdur 60 mg p.o. daily 8.  Crestor 20 mg p.o. daily 9.  Topamax 150 mg p.o. daily 10.  Ferrous sulfate 325 mg daily 11.  Vitamin B12 1000 mcg daily 12.  Nitroglycerin as needed chest pain   30-35 minutes was spent completing discharge summary and discharge planning  Discharge Instructions     Ambulatory referral to Neurology   Complete by: As directed    An appointment is requested in approximately: 4 weeks left thalamic infarction   Ambulatory referral to Physical Medicine Rehab   Complete by: As directed    Moderate complexity follow-up 1 to 2 weeks left thalamic infarction        Follow-up Information     Kirsteins, Luanna Salk, MD Follow up.   Specialty: Physical Medicine and Rehabilitation Why: office to call for appointment Contact information: [place] [address] [phone]         [name], MD Follow up.   Specialty: Cardiology Why: call for appointment Contact information: [address] [place] [address] [phone]                 Signed: Lavon Paganini Turpin 01/13/2022, 5:18 AM

## 2022-01-10 LAB — BASIC METABOLIC PANEL
Anion gap: 6 (ref 5–15)
BUN: 68 mg/dL — ABNORMAL HIGH (ref 8–23)
CO2: 20 mmol/L — ABNORMAL LOW (ref 22–32)
Calcium: 8.5 mg/dL — ABNORMAL LOW (ref 8.9–10.3)
Chloride: 116 mmol/L — ABNORMAL HIGH (ref 98–111)
Creatinine, Ser: 2.72 mg/dL — ABNORMAL HIGH (ref 0.61–1.24)
GFR, Estimated: 22 mL/min — ABNORMAL LOW (ref >60)
Glucose, Bld: 98 mg/dL (ref 70–99)
Potassium: 4.7 mmol/L (ref 3.5–5.1)
Sodium: 142 mmol/L (ref 135–145)

## 2022-01-10 NOTE — Progress Notes (Signed)
PROGRESS NOTE   Subjective/Complaints:  Patient seen today in chair with daughter in room.  Says he "feels grumpy today", but didn't specify why. He said that he slept "fair" overnight.   ROS:  Pt denies SOB, CP, abd pain, N/V/C/D, vision changes, or headache. Objective:   No results found. No results for input(s): WBC, HGB, HCT, PLT in the last 72 hours. Recent Labs    01/08/22 0516 01/10/22 0716  NA 142 142  K 4.6 4.7  CL 118* 116*  CO2 19* 20*  GLUCOSE 95 98  BUN 70* 68*  CREATININE 2.93* 2.72*  CALCIUM 8.4* 8.5*    Intake/Output Summary (Last 24 hours) at 01/10/2022 1809 Last data filed at 01/10/2022 1300 Gross per 24 hour  Intake 740 ml  Output 950 ml  Net -210 ml        Physical Exam: Vital Signs Blood pressure 127/62, pulse 64, temperature 98 F (36.7 C), resp. rate 12, height '5\' 8"'$  (1.727 m), weight 82.9 kg, SpO2 99 %.   General: Alert and oriented x 3, No apparent distress HEENT: Head is normocephalic, atraumatic, PERRLA, EOMI, sclera anicteric, oral mucosa pink and moist, dentition intact, ext ear canals clear,  Neck: Supple without JVD or lymphadenopathy Heart: Reg rate and rhythm. No murmurs rubs or gallops Chest: CTA bilaterally without wheezes, rales, or rhonchi; no distress Abdomen: Soft, non-tender, non-distended, bowel sounds positive. Extremities: No clubbing, cyanosis, or edema. Pulses are 2+ Psych: Pt's affect is appropriate. Pt is cooperative Skin: Clean and intact without signs of breakdown Neuro: CN II-XOO intact. Awake and alert, makes eye contact with examiner.  Some mildly dysarthric speech but fully intelligible.  Oriented x 3. Insight and awareness is reasonable.  Rt limb ataxia with past pointing.  Memory is functional. RLE mild ataxia.  RUE 4/5. LUE 4+/5. RLE 4/5 prox to distal. LLE 4/5 with left knee pain inhibition.  Sensory intact to LT, pain. DTR's +1. No abnormal  tone. Musculoskeletal: FROM normal all 4 extremities. Right knee w TKA scar. Left knee effusion,+crepitus, tender AROM/PROM, chronic.  Assessment/Plan: 1. Functional deficits which require 3+ hours per day of interdisciplinary therapy in a comprehensive inpatient rehab setting. Physiatrist is providing close team supervision and 24 hour management of active medical problems listed below. Physiatrist and rehab team continue to assess barriers to discharge/monitor patient progress toward functional and medical goals  Care Tool:  Bathing    Body parts bathed by patient: Front perineal area, Buttocks, Face   Body parts bathed by helper: Buttocks (feet, back)     Bathing assist Assist Level: Minimal Assistance - Patient > 75%     Upper Body Dressing/Undressing Upper body dressing   What is the patient wearing?: Pull over shirt, Button up shirt    Upper body assist Assist Level: Contact Guard/Touching assist    Lower Body Dressing/Undressing Lower body dressing      What is the patient wearing?: Pants, Underwear/pull up     Lower body assist Assist for lower body dressing: Minimal Assistance - Patient > 75%     Toileting Toileting    Toileting assist Assist for toileting: Supervision/Verbal cueing Assistive Device Comment: RW  Transfers Chair/bed transfer  Transfers assist     Chair/bed transfer assist level: Contact Guard/Touching assist     Locomotion Ambulation   Ambulation assist      Assist level: Minimal Assistance - Patient > 75% Assistive device: Walker-rolling Max distance: 43 ft   Walk 10 feet activity   Assist     Assist level: Minimal Assistance - Patient > 75% Assistive device: Walker-rolling   Walk 50 feet activity   Assist Walk 50 feet with 2 turns activity did not occur: Safety/medical concerns         Walk 150 feet activity   Assist Walk 150 feet activity did not occur: Safety/medical concerns         Walk 10 feet  on uneven surface  activity   Assist Walk 10 feet on uneven surfaces activity did not occur: Safety/medical concerns         Wheelchair     Assist Is the patient using a wheelchair?: Yes Type of Wheelchair: Manual    Wheelchair assist level: Total Assistance - Patient < 25% Max wheelchair distance: 250 ft    Wheelchair 50 feet with 2 turns activity    Assist        Assist Level: Total Assistance - Patient < 25%   Wheelchair 150 feet activity     Assist      Assist Level: Total Assistance - Patient < 25%   Blood pressure 127/62, pulse 64, temperature 98 F (36.7 C), resp. rate 12, height '5\' 8"'$  (1.727 m), weight 82.9 kg, SpO2 99 %.  Medical Problem List and Plan:likely cardioembolic  1. Functional deficits secondary to left thalamic infarction and small left cerebellar infarct             -patient may shower             -ELOS/Goals: 01/13/22 S to mod I             Con't CIR- PT, OT and SLP             -Fam education sunday 2.  Antithrombotics: -DVT/anticoagulation:  Pharmaceutical: Other (comment) Eliquis             -antiplatelet therapy: N/A 3. Pain Management: Tylenol as needed             -add voltaren gel QID for endstage OA left knee             -5/27- pain just "there"- doing OK- will con't voltaren gel             -6/1 Appears to be using PRN tylenol, continue to follow 6/3 No issues with pain management 4. Mood: Provide emotional support             -antipsychotic agents: N/A 5. Neuropsych: This patient is capable of making decisions on his own behalf. 6. Skin/Wound Care: Routine skin checks 7. Fluids/Electrolytes/Nutrition: Routine in and outs with follow-up chemistries 8.  Atrial fibrillation.  Cardizem 120 mg daily.  Continue Eliquis.  Cardiac rate controlled at present    01/10/2022    1:20 PM 01/10/2022    4:57 AM 01/09/2022    8:00 PM  Vitals with BMI  Weight  182 lbs 12 oz   BMI  12.4   Systolic 580 998 338  Diastolic 62 68 71  Pulse  64 66 68   Elevated systolic with HR in 25K will increase cardizem to '180mg'$ , monitor BP on this dose for another 1-2 d, HR  running lower today , which may limit further escalation of dose , also on lasix  -6/2 Will increase cardizem to 240, HR has been stable 6/3 SBP 120's-130's stable, continue cardiazem 240 mg daily 9.  Hyperlipidemia.  Crestor 10.  Diastolic congestive heart failure.  Lasix 20 mg daily.  Monitor for any signs of fluid overload             -daily weights             -Weight appears overall stable, continue to monitor          6/3 Weight stable Filed Weights   01/08/22 0500 01/09/22 0500 01/10/22 0457  Weight: 82.5 kg 83.1 kg 82.9 kg   5/27- made sure has daily weights- will monitor- 5/28- ordered- will double check with nursing- don't se ein computer  11.  CAD/stenting/TAVR.  Continue Imdur 60 mg daily 12.  CKD stage IV.  Check BMET 5/31    Latest Ref Rng & Units 01/10/2022    7:16 AM 01/08/2022    5:16 AM 01/05/2022    5:59 AM  BMP  Glucose 70 - 99 mg/dL 98   95   94    BUN 8 - 23 mg/dL 68   70   72    Creatinine 0.61 - 1.24 mg/dL 2.72   2.93   2.97    Sodium 135 - 145 mmol/L 142   142   140    Potassium 3.5 - 5.1 mmol/L 4.7   4.6   4.5    Chloride 98 - 111 mmol/L 116   118   117    CO2 22 - 32 mmol/L '20   19   19    '$ Calcium 8.9 - 10.3 mg/dL 8.5   8.4   8.2     Intake 1448m yesterday plus received IVF, will D/C IVF recheck BMET in am  -Recorded intake appears less than ideal, Creatinine was overall stable 6/1, will recheck tomorrow  6/3 Crt 2.72, BUN 68 stable BMET next Monday 13.  History of migraine headaches.  Topamax 150 mg nightly 14.  Chronic anemia.  On anticoagulation check stool OB , MCV on high side but nl B12 , pt states he was getting B12 shots as an outpatient last one was 5/16, can get next injection as OP  6/3 Hgb stable at 9.8    Latest Ref Rng & Units 01/05/2022    5:59 AM 12/30/2021    5:32 AM 12/28/2021    1:52 AM  CBC  WBC 4.0 - 10.5 K/uL  10.5   11.6   10.3    Hemoglobin 13.0 - 17.0 g/dL 9.8   10.0   9.9    Hematocrit 39.0 - 52.0 % 29.6   29.5   30.8    Platelets 150 - 400 K/uL 195   127   120     15.  BPH with history of prostate cancer.  Check PVR and follow for patterns. Pt denies any emptying issues at present 6/3 Denies emptying issues today.    LOS: 12 days A FACE TO FACE EVALUATION WAS PERFORMED  LLuetta Nutting6/10/2021, 6:09 PM

## 2022-01-11 NOTE — Progress Notes (Signed)
Occupational Therapy Session Note  Patient Details  Name: Mark Jackson. MRN: [id number] Date of Birth: September 14, 1929  Today's Date: 01/11/2022 OT Individual Time: 1300-1400 OT Individual Time Calculation (min): 60 min    Short Term Goals: Week 1:  OT Short Term Goal 1 (Week 1): Pt will maintain static sitting balance EOB with S to complete ADL. OT Short Term Goal 1 - Progress (Week 1): Met OT Short Term Goal 2 (Week 1): Pt will don pants with mod A. OT Short Term Goal 2 - Progress (Week 1): Met OT Short Term Goal 3 (Week 1): Pt will complete toilet transfer with mod A and LRAD. OT Short Term Goal 3 - Progress (Week 1): Met OT Short Term Goal 4 (Week 1): Pt will complete standing grooming task at sink with min A. OT Short Term Goal 4 - Progress (Week 1): Met Week 2:  OT Short Term Goal 2 (Week 2): STG = LTG 2/2 ELOS Week 3:     Skilled Therapeutic Interventions/Progress Updates:    1:1 Family education with pt's two daughters and son. Discussion about safety at home with ADL and functional mobility with RW, including options for caring for the dog, life alert v apple watch, what to do if a fall occurs (pt declined wanting to practice a floor transfer), demonstrated shower stall transfer with family present at home, management of frequent urination at night, recommendation of walker tray to transport items at home, went to the ADL apt and practiced getting up from low recliner, practicing loading and unloading the dishwasher, discussion about making simple meals, fatigue with mobility and review of HEP provided etc. Pt left sitting in the w/c in prep for PT fam edu  Therapy Documentation Precautions:  Precautions Precautions: Fall Precaution Comments: R hemiparesis RLE>RUE Restrictions Weight Bearing Restrictions: No General:   Vital Signs: Therapy Vitals Temp: 97.7 F (36.5 C) Temp Source: Oral Pulse Rate: 69 Resp: 16 BP: 117/63 Patient Position (if appropriate):  Sitting Oxygen Therapy SpO2: 100 % O2 Device: Room Air Pain:  No c/o pain in session   Therapy/Group: Individual Therapy  Willeen Cass [facility] 01/11/2022, 4:10 PM

## 2022-01-11 NOTE — Progress Notes (Signed)
PROGRESS NOTE   Subjective/Complaints:  Patient seen today in chair in good spirits, says that he will have two daughters and one son come today for family teaching/education.  No overnight complaints.  ROS:  Pt denies SOB, CP, abd pain, N/V/C/D, vision changes, or headache.  Objective:   No results found. No results for input(s): WBC, HGB, HCT, PLT in the last 72 hours. Recent Labs    01/10/22 0716  NA 142  K 4.7  CL 116*  CO2 20*  GLUCOSE 98  BUN 68*  CREATININE 2.72*  CALCIUM 8.5*    Intake/Output Summary (Last 24 hours) at 01/11/2022 1141 Last data filed at 01/11/2022 0835 Gross per 24 hour  Intake 660 ml  Output 950 ml  Net -290 ml        Physical Exam: Vital Signs Blood pressure (!) 153/66, pulse 68, temperature 97.8 F (36.6 C), resp. rate 17, height '5\' 8"'$  (1.727 m), weight 82.1 kg, SpO2 99 %.   General: Alert and oriented x 3, No apparent distress HEENT: Head is normocephalic, atraumatic, PERRLA, EOMI, sclera anicteric, oral mucosa pink and moist, dentition intact, ext ear canals clear,  Neck: Supple without JVD or lymphadenopathy Heart: Reg rate and rhythm. No murmurs rubs or gallops Chest: CTA bilaterally without wheezes, rales, or rhonchi; no distress Abdomen: Soft, non-tender, non-distended, bowel sounds positive. Extremities: No clubbing, cyanosis, or edema. Pulses are 2+ Psych: Pt's affect is appropriate. Pt is cooperative Skin: Clean and intact without signs of breakdown Neuro: CN II-XII intact. Awake and alert, makes eye contact with examiner.  Some mildly dysarthric speech but fully intelligible.  Oriented x 3. Insight and awareness is reasonable.  Rt limb ataxia with past pointing.  Memory is functional. RLE mild ataxia.  RUE 4/5. LUE 4+/5. RLE 4/5 prox to distal. LLE 4/5 with left knee pain inhibition.  Sensory intact to LT, pain. DTR's +1. No abnormal tone. Musculoskeletal: FROM normal all 4  extremities. Right knee w TKA scar. Left knee effusion,+crepitus, tender AROM/PROM, chronic.  Assessment/Plan: 1. Functional deficits which require 3+ hours per day of interdisciplinary therapy in a comprehensive inpatient rehab setting. Physiatrist is providing close team supervision and 24 hour management of active medical problems listed below. Physiatrist and rehab team continue to assess barriers to discharge/monitor patient progress toward functional and medical goals  Care Tool:  Bathing    Body parts bathed by patient: Front perineal area, Buttocks, Face   Body parts bathed by helper: Buttocks (feet, back)     Bathing assist Assist Level: Minimal Assistance - Patient > 75%     Upper Body Dressing/Undressing Upper body dressing   What is the patient wearing?: Pull over shirt, Button up shirt    Upper body assist Assist Level: Contact Guard/Touching assist    Lower Body Dressing/Undressing Lower body dressing      What is the patient wearing?: Pants, Underwear/pull up     Lower body assist Assist for lower body dressing: Minimal Assistance - Patient > 75%     Toileting Toileting    Toileting assist Assist for toileting: Supervision/Verbal cueing Assistive Device Comment: RW   Transfers Chair/bed transfer  Transfers assist  Chair/bed transfer assist level: Contact Guard/Touching assist     Locomotion Ambulation   Ambulation assist      Assist level: Minimal Assistance - Patient > 75% Assistive device: Walker-rolling Max distance: 43 ft   Walk 10 feet activity   Assist     Assist level: Minimal Assistance - Patient > 75% Assistive device: Walker-rolling   Walk 50 feet activity   Assist Walk 50 feet with 2 turns activity did not occur: Safety/medical concerns         Walk 150 feet activity   Assist Walk 150 feet activity did not occur: Safety/medical concerns         Walk 10 feet on uneven surface  activity   Assist  Walk 10 feet on uneven surfaces activity did not occur: Safety/medical concerns         Wheelchair     Assist Is the patient using a wheelchair?: Yes Type of Wheelchair: Manual    Wheelchair assist level: Total Assistance - Patient < 25% Max wheelchair distance: 250 ft    Wheelchair 50 feet with 2 turns activity    Assist        Assist Level: Total Assistance - Patient < 25%   Wheelchair 150 feet activity     Assist      Assist Level: Total Assistance - Patient < 25%   Blood pressure (!) 153/66, pulse 68, temperature 97.8 F (36.6 C), resp. rate 17, height '5\' 8"'$  (1.727 m), weight 82.1 kg, SpO2 99 %.  Medical Problem List and Plan:likely cardioembolic  1. Functional deficits secondary to left thalamic infarction and small left cerebellar infarct             -patient may shower             -ELOS/Goals: 01/13/22 S to mod I             Con't CIR- PT, OT and SLP             -Fam education Sunday            6/4 Education planned for today. 2.  Antithrombotics: -DVT/anticoagulation:  Pharmaceutical: Other (comment) Eliquis             -antiplatelet therapy: N/A 3. Pain Management: Tylenol as needed             -add voltaren gel QID for endstage OA left knee             -5/27- pain just "there"- doing OK- will con't voltaren gel             -6/1 Appears to be using PRN tylenol, continue to follow 6/4 No issues with pain management 4. Mood: Provide emotional support             -antipsychotic agents: N/A 5. Neuropsych: This patient is capable of making decisions on his own behalf. 6. Skin/Wound Care: Routine skin checks 7. Fluids/Electrolytes/Nutrition: Routine in and outs with follow-up chemistries 8.  Atrial fibrillation.  Cardizem 120 mg daily.  Continue Eliquis.  Cardiac rate controlled at present    01/11/2022    5:51 AM 01/11/2022    5:00 AM 01/10/2022    7:38 PM  Vitals with BMI  Weight  181 lbs   BMI  93.71   Systolic 696  789  Diastolic 66  74  Pulse  68  64   Elevated systolic with HR in 38B will increase cardizem to '180mg'$ , monitor BP on this dose  for another 1-2 d, HR running lower today , which may limit further escalation of dose , also on lasix  -6/2 Will increase cardizem to 240, HR has been stable 6/3 SBP 120's-130's stable, continue cardizem 240 mg daily 6/4 Fair BP control with 240 mg Cardizem daily, may need more time to trend dose effectiveness. 9.  Hyperlipidemia.  Crestor 10.  Diastolic congestive heart failure.  Lasix 20 mg daily.  Monitor for any signs of fluid overload             -daily weights             -Weight appears overall stable, continue to monitor          6/4 Weight stable Filed Weights   01/09/22 0500 01/10/22 0457 01/11/22 0500  Weight: 83.1 kg 82.9 kg 82.1 kg   5/27- made sure has daily weights- will monitor- 5/28- ordered- will double check with nursing- don't se ein computer  11.  CAD/stenting/TAVR.  Continue Imdur 60 mg daily 12.  CKD stage IV.  Check BMET 5/31    Latest Ref Rng & Units 01/10/2022    7:16 AM 01/08/2022    5:16 AM 01/05/2022    5:59 AM  BMP  Glucose 70 - 99 mg/dL 98   95   94    BUN 8 - 23 mg/dL 68   70   72    Creatinine 0.61 - 1.24 mg/dL 2.72   2.93   2.97    Sodium 135 - 145 mmol/L 142   142   140    Potassium 3.5 - 5.1 mmol/L 4.7   4.6   4.5    Chloride 98 - 111 mmol/L 116   118   117    CO2 22 - 32 mmol/L '20   19   19    '$ Calcium 8.9 - 10.3 mg/dL 8.5   8.4   8.2     Intake 1450m yesterday plus received IVF, will D/C IVF recheck BMET in am  -Recorded intake appears less than ideal, Creatinine was overall stable 6/1, will recheck tomorrow  6/3 Crt 2.72, BUN 68 stable BMET next Monday 13.  History of migraine headaches.  Topamax 150 mg nightly 14.  Chronic anemia.  On anticoagulation check stool OB , MCV on high side but nl B12 , pt states he was getting B12 shots as an outpatient last one was 5/16, can get next injection as OP  6/4 Hgb stable at 9.8    Latest Ref Rng & Units  01/05/2022    5:59 AM 12/30/2021    5:32 AM 12/28/2021    1:52 AM  CBC  WBC 4.0 - 10.5 K/uL 10.5   11.6   10.3    Hemoglobin 13.0 - 17.0 g/dL 9.8   10.0   9.9    Hematocrit 39.0 - 52.0 % 29.6   29.5   30.8    Platelets 150 - 400 K/uL 195   127   120     15.  BPH with history of prostate cancer.  Check PVR and follow for patterns. Pt denies any emptying issues at present 6/4 Denies emptying issues today.    LOS: 13 days A FACE TO FACE EVALUATION WAS PERFORMED  LLuetta Nutting6/11/2021, 11:41 AM

## 2022-01-11 NOTE — Progress Notes (Signed)
Physical Therapy Session Note  Patient Details  Name: Mark Jackson. MRN: [id number] Date of Birth: May 19, 1930  Today's Date: 01/11/2022 PT Individual Time: 7989-2119 PT Individual Time Calculation (min): 57 min   Short Term Goals: Week 1:  PT Short Term Goal 1 (Week 1): Pt will perform bed mobility using no bed features with CGA. PT Short Term Goal 1 - Progress (Week 1): Progressing toward goal PT Short Term Goal 2 (Week 1): Pt will perform all functional standing transfers with supervision and no posterior bias. PT Short Term Goal 2 - Progress (Week 1): Progressing toward goal PT Short Term Goal 3 (Week 1): Pt will ambulate 150 feet consistently with CGA/ supervision using LRAD. PT Short Term Goal 3 - Progress (Week 1): Met PT Short Term Goal 4 (Week 1): Pt will ambulate steps using BHR with reciprocating gait pattern and CGA. PT Short Term Goal 4 - Progress (Week 1): Progressing toward goal Week 2:  PT Short Term Goal 1 (Week 2): Pt will perform bed mobility using no bed features with supervision. PT Short Term Goal 2 (Week 2): Pt will perform all functional standing transfers with supervision and no posterior bias. PT Short Term Goal 3 (Week 2): Pt will ambulate >200 feet consistently with  supervision using LRAD. PT Short Term Goal 4 (Week 2): Pt will ambulate steps using BHR with reciprocating gait pattern and CGA. Week 3:     Skilled Therapeutic Interventions/Progress Updates:   PAIN  Family training w/son and two daughters who wre active participa=nts in hands on learning.  Activities performed: Gait training w/RW - educated on occasional RLE clearance issues, fall risk presented w/this, increased challenge w/uneven surfaces, safe guarding technique esp w/fatigue.  Son performed hands on gait training and demonstrated safety w/this.  Pt w/single epixode of "tripping" wihtout LOOB w/son assisting pt safely.  Gait performed 150x2 and on ramp for uneven surface challenge.  Car  transfer - therapist demonstrated guarding w/pt who's preferred method demonstrates poor safety awareness.   Reviewed safe technique using RW via ambulatory approach.         Son then performed safe guarding/cueing to complete car transfer/corrected pt as needed.  Floor recovery therapist first demonstrated 2 techniques of fall recovery then pt agreeable to attempt via: From mat to tall kneeling to quadruped to sidelying for simulated fall performed w/min assist. Family observed therapist assist pt, overall min to light mod for recovery via reverse of above.  Pt w/some difficulty w/transition from tall kneel to 1/2 kneel but performs w/light mod assist for positioning of limb.  Completes w/overall min to light mod.    Daughter then practiced fall recovery w/therapist acting as pt and performs w/cueing from therapist for [place] of recovery/guarding.  Family videoed fall recovery for reference.    BEFAST education performed w/pt and family.  Pt left oob in wc w/alarm belt set and needs in reach.  Family w/patient.    Therapy Documentation Precautions:  Precautions Precautions: Fall Precaution Comments: R hemiparesis RLE>RUE Restrictions Weight Bearing Restrictions: No   Other Treatments:      Therapy/Group: Individual Therapy Callie Fielding, PT   Jerrilyn Cairo 01/11/2022, 4:14 PM

## 2022-01-11 NOTE — Plan of Care (Signed)
  Problem: RH Ambulation Goal: LTG Patient will ambulate in controlled environment (PT) Description: LTG: Patient will ambulate in a controlled environment, # of feet with assistance (PT). Flowsheets Taken 01/11/2022 2216 by Lorie Phenix, PT LTG: Pt will ambulate in controlled environ  assist needed:: Contact Guard/Touching assist Taken 12/30/2021 1843 by Alger Simons, PT LTG: Ambulation distance in controlled environment: at least 200 ft using LRAD Goal: LTG Patient will ambulate in home environment (PT) Description: LTG: Patient will ambulate in home environment, # of feet with assistance (PT). Flowsheets Taken 01/11/2022 2216 by Lorie Phenix, PT LTG: Pt will ambulate in home environ  assist needed:: Contact Guard/Touching assist Taken 12/30/2021 1843 by Alger Simons, PT LTG: Ambulation distance in home environment: at least 50 ft using LRAD

## 2022-01-12 LAB — CBC
HCT: 28.3 % — ABNORMAL LOW (ref 39.0–52.0)
Hemoglobin: 9.5 g/dL — ABNORMAL LOW (ref 13.0–17.0)
MCH: 33.7 pg (ref 26.0–34.0)
MCHC: 33.6 g/dL (ref 30.0–36.0)
MCV: 100.4 fL — ABNORMAL HIGH (ref 80.0–100.0)
Platelets: 206 10*3/uL (ref 150–400)
RBC: 2.82 MIL/uL — ABNORMAL LOW (ref 4.22–5.81)
RDW: 12.7 % (ref 11.5–15.5)
WBC: 10.9 10*3/uL — ABNORMAL HIGH (ref 4.0–10.5)
nRBC: 0 % (ref 0.0–0.2)

## 2022-01-12 LAB — BASIC METABOLIC PANEL
Anion gap: 4 — ABNORMAL LOW (ref 5–15)
BUN: 67 mg/dL — ABNORMAL HIGH (ref 8–23)
CO2: 20 mmol/L — ABNORMAL LOW (ref 22–32)
Calcium: 8.2 mg/dL — ABNORMAL LOW (ref 8.9–10.3)
Chloride: 116 mmol/L — ABNORMAL HIGH (ref 98–111)
Creatinine, Ser: 2.77 mg/dL — ABNORMAL HIGH (ref 0.61–1.24)
GFR, Estimated: 21 mL/min — ABNORMAL LOW (ref >60)
Glucose, Bld: 97 mg/dL (ref 70–99)
Potassium: 4.8 mmol/L (ref 3.5–5.1)
Sodium: 140 mmol/L (ref 135–145)

## 2022-01-12 MED ORDER — DICLOFENAC SODIUM 1 % EX GEL: 2.0000 g | Freq: Four times a day (QID) | CUTANEOUS | 0 refills | Status: AC

## 2022-01-12 MED ORDER — EZETIMIBE 10 MG PO TABS: 10.0000 mg | ORAL_TABLET | Freq: Every day | ORAL | 11 refills | Status: DC

## 2022-01-12 MED ORDER — VITAMIN B-12 1000 MCG PO TABS: 1000.0000 ug | ORAL_TABLET | Freq: Every day | ORAL | 0 refills | Status: DC

## 2022-01-12 MED ORDER — ISOSORBIDE MONONITRATE ER 60 MG PO TB24: ORAL_TABLET | ORAL | 3 refills | Status: DC

## 2022-01-12 MED ORDER — TOPIRAMATE 100 MG PO TABS: 150.0000 mg | ORAL_TABLET | Freq: Every day | ORAL | 3 refills | Status: DC

## 2022-01-12 MED ORDER — DILTIAZEM HCL ER COATED BEADS 240 MG PO CP24: 240.0000 mg | ORAL_CAPSULE | Freq: Every day | ORAL | 0 refills | Status: DC

## 2022-01-12 MED ORDER — ROSUVASTATIN CALCIUM 20 MG PO TABS: ORAL_TABLET | ORAL | 1 refills | Status: DC

## 2022-01-12 MED ORDER — APIXABAN 2.5 MG PO TABS: 2.5000 mg | ORAL_TABLET | Freq: Two times a day (BID) | ORAL | 1 refills | Status: DC

## 2022-01-12 MED ORDER — FUROSEMIDE 40 MG PO TABS
40.0000 mg | ORAL_TABLET | Freq: Once | ORAL | Status: AC
  Administered 2022-01-12: 40 mg via ORAL
  Filled 2022-01-12: qty 1

## 2022-01-12 MED ORDER — ACETAMINOPHEN 325 MG PO TABS: 650.0000 mg | ORAL_TABLET | ORAL | Status: AC | PRN

## 2022-01-12 MED ORDER — FUROSEMIDE 40 MG PO TABS
20.0000 mg | ORAL_TABLET | Freq: Every day | ORAL | 0 refills | Status: DC
Start: 2022-01-12 — End: 2022-05-20

## 2022-01-12 NOTE — Progress Notes (Signed)
Recreational Therapy Discharge Summary Patient Details  Name: Mark Jackson. MRN: [id number] Date of Birth: 1930/07/27 Today's Date: 01/12/2022  Comments on progress toward goals: Pt has made good progress during LOS and is discharging home with family tomorrow with supervision/assistance.  TR sessions focused on pt education in regards to activity analysis/modifications, leisure education, domains of wellness and stress management.  Pt also participated in animal assisted activities during LOS and dynamic balance activities.  Pt is anxious to return home and to previously enjoyed/routine activities.   Reasons for discharge: discharge from hospital  Follow-up: Outpatient  Patient/family agrees with progress made and goals achieved: Yes  Sady Monaco 01/12/2022, 3:20 PM

## 2022-01-12 NOTE — Progress Notes (Signed)
PROGRESS NOTE   Subjective/Complaints:   LE swelling no pain or SOB   ROS:  Pt denies SOB, CP, abd pain, N/V/C/D, vision changes, or headache.  Objective:   No results found. Recent Labs    01/12/22 0603  WBC 10.9*  HGB 9.5*  HCT 28.3*  PLT 206   Recent Labs    01/10/22 0716 01/12/22 0603  NA 142 140  K 4.7 4.8  CL 116* 116*  CO2 20* 20*  GLUCOSE 98 97  BUN 68* 67*  CREATININE 2.72* 2.77*  CALCIUM 8.5* 8.2*     Intake/Output Summary (Last 24 hours) at 01/12/2022 0750 Last data filed at 01/12/2022 0124 Gross per 24 hour  Intake 700 ml  Output 550 ml  Net 150 ml         Physical Exam: Vital Signs Blood pressure (!) 154/72, pulse 67, temperature 97.8 F (36.6 C), resp. rate 17, height '5\' 8"'$  (1.727 m), weight 82.1 kg, SpO2 100 %.   General: No acute distress Mood and affect are appropriate Heart: Regular rate and rhythm no rubs murmurs or extra sounds Lungs: Clear to auscultation, breathing unlabored, no rales or wheezes Abdomen: Positive bowel sounds, soft nontender to palpation, nondistended Extremities: No clubbing, cyanosis, or edema Skin: No evidence of breakdown, no evidence of rash  Neuro: CN II-XII intact. Awake and alert, makes eye contact with examiner.  Some mildly dysarthric speech but fully intelligible.  Oriented x 3. Insight and awareness is reasonable.  Rt limb ataxia with past pointing.  Memory is functional. RLE mild ataxia.  RUE 4/5. LUE 4+/5. RLE 4/5 prox to distal. LLE 4/5 with left knee pain inhibition.  Sensory intact to LT, pain. DTR's +1. No abnormal tone. Musculoskeletal: FROM normal all 4 extremities. Right knee w TKA scar. Left knee effusion,+crepitus, tender AROM/PROM, chronic.  Assessment/Plan: 1. Functional deficits which require 3+ hours per day of interdisciplinary therapy in a comprehensive inpatient rehab setting. Physiatrist is providing close team supervision and 24  hour management of active medical problems listed below. Physiatrist and rehab team continue to assess barriers to discharge/monitor patient progress toward functional and medical goals  Care Tool:  Bathing    Body parts bathed by patient: Front perineal area, Buttocks, Face   Body parts bathed by helper: Buttocks (feet, back)     Bathing assist Assist Level: Minimal Assistance - Patient > 75%     Upper Body Dressing/Undressing Upper body dressing   What is the patient wearing?: Pull over shirt, Button up shirt    Upper body assist Assist Level: Contact Guard/Touching assist    Lower Body Dressing/Undressing Lower body dressing      What is the patient wearing?: Pants, Underwear/pull up     Lower body assist Assist for lower body dressing: Minimal Assistance - Patient > 75%     Toileting Toileting    Toileting assist Assist for toileting: Supervision/Verbal cueing Assistive Device Comment: RW   Transfers Chair/bed transfer  Transfers assist     Chair/bed transfer assist level: Contact Guard/Touching assist     Locomotion Ambulation   Ambulation assist      Assist level: Minimal Assistance - Patient > 75%  Assistive device: Walker-rolling Max distance: 43 ft   Walk 10 feet activity   Assist     Assist level: Minimal Assistance - Patient > 75% Assistive device: Walker-rolling   Walk 50 feet activity   Assist Walk 50 feet with 2 turns activity did not occur: Safety/medical concerns         Walk 150 feet activity   Assist Walk 150 feet activity did not occur: Safety/medical concerns         Walk 10 feet on uneven surface  activity   Assist Walk 10 feet on uneven surfaces activity did not occur: Safety/medical concerns         Wheelchair     Assist Is the patient using a wheelchair?: Yes Type of Wheelchair: Manual    Wheelchair assist level: Total Assistance - Patient < 25% Max wheelchair distance: 250 ft     Wheelchair 50 feet with 2 turns activity    Assist        Assist Level: Total Assistance - Patient < 25%   Wheelchair 150 feet activity     Assist      Assist Level: Total Assistance - Patient < 25%   Blood pressure (!) 154/72, pulse 67, temperature 97.8 F (36.6 C), resp. rate 17, height '5\' 8"'$  (1.727 m), weight 82.1 kg, SpO2 100 %.  Medical Problem List and Plan:likely cardioembolic  1. Functional deficits secondary to left thalamic infarction and small left cerebellar infarct             -patient may shower             -ELOS/Goals: 01/13/22 S to mod I             Con't CIR- PT, OT and SLP    2.  Antithrombotics: -DVT/anticoagulation:  Pharmaceutical: Other (comment) Eliquis             -antiplatelet therapy: N/A 3. Pain Management: Tylenol as needed             -add voltaren gel QID for endstage OA left knee             -5/27- pain just "there"- doing OK- will con't voltaren gel             -6/1 Appears to be using PRN tylenol, continue to follow 6/4 No issues with pain management 4. Mood: Provide emotional support             -antipsychotic agents: N/A 5. Neuropsych: This patient is capable of making decisions on his own behalf. 6. Skin/Wound Care: Routine skin checks 7. Fluids/Electrolytes/Nutrition: Routine in and outs with follow-up chemistries 8.  Atrial fibrillation.  Cardizem 120 mg daily.  Continue Eliquis.  Cardiac rate controlled at present    01/12/2022    3:51 AM 01/11/2022    7:45 PM 01/11/2022    2:57 PM  Vitals with BMI  Systolic 518 841 660  Diastolic 72 67 63  Pulse 67 70 69   Elevated systolic with HR in 63K will increase cardizem to '180mg'$ , monitor BP on this dose for another 1-2 d, HR running lower today , which may limit further escalation of dose , also on lasix  -6/2 Will increase cardizem to 240, HR has been stable 6/3 SBP 120's-130's stable, continue cardizem 240 mg daily 6/4 Fair BP control with 240 mg Cardizem daily, may need more  time to trend dose effectiveness. 9.  Hyperlipidemia.  Crestor 10.  Diastolic congestive heart failure.  Lasix 20 mg daily.  Monitor for any signs of fluid overload             -daily weights             -Weight appears overall stable, continue to monitor          6/4 Weight stable Filed Weights   01/09/22 0500 01/10/22 0457 01/11/22 0500  Weight: 83.1 kg 82.9 kg 82.1 kg   5/27- made sure has daily weights- will monitor- 5/28- ordered- will double check with nursing- don't se ein computer  11.  CAD/stenting/TAVR.  Continue Imdur 60 mg daily 12.  CKD stage IV.  Check BMET 5/31    Latest Ref Rng & Units 01/12/2022    6:03 AM 01/10/2022    7:16 AM 01/08/2022    5:16 AM  BMP  Glucose 70 - 99 mg/dL 97   98   95    BUN 8 - 23 mg/dL 67   68   70    Creatinine 0.61 - 1.24 mg/dL 2.77   2.72   2.93    Sodium 135 - 145 mmol/L 140   142   142    Potassium 3.5 - 5.1 mmol/L 4.8   4.7   4.6    Chloride 98 - 111 mmol/L 116   116   118    CO2 22 - 32 mmol/L '20   20   19    '$ Calcium 8.9 - 10.3 mg/dL 8.2   8.5   8.4     Renal status at baseline  13.  History of migraine headaches.  Topamax 150 mg nightly 14.  Chronic anemia.  On anticoagulation check stool OB , MCV on high side but nl B12 , pt states he was getting B12 shots as an outpatient last one was 5/16, can get next injection as OP  6/4 Hgb stable at 9.8    Latest Ref Rng & Units 01/12/2022    6:03 AM 01/05/2022    5:59 AM 12/30/2021    5:32 AM  CBC  WBC 4.0 - 10.5 K/uL 10.9   10.5   11.6    Hemoglobin 13.0 - 17.0 g/dL 9.5   9.8   10.0    Hematocrit 39.0 - 52.0 % 28.3   29.6   29.5    Platelets 150 - 400 K/uL 206   195   127     15.  BPH with history of prostate cancer.  Check PVR and follow for patterns. Pt denies any emptying issues at present Emptying bladder   16.  LE swelling likely related to CKD, hypoalb, ECHO with nl EF fx could not determine diastolic fxn , give extra '40mg'$  Lasix x 1 , Knee hi TED hose   LOS: 14 days A FACE TO FACE  EVALUATION WAS PERFORMED  Charlett Blake 01/12/2022, 7:50 AM

## 2022-01-12 NOTE — Progress Notes (Signed)
Inpatient Rehabilitation Care Coordinator Discharge Note   Patient Details  Name: Mark Jackson. MRN: [id number] Date of Birth: 14-Jan-1930   Discharge location: Home  Length of Stay: 11 Days  Discharge activity level: Sup  Home/community participation: Daughter  Patient response HT:DSKAJG Literacy - How often do you need to have someone help you when you read instructions, pamphlets, or other written material from your doctor or pharmacy?: Never  Patient response OT:LXBWIO Isolation - How often do you feel lonely or isolated from those around you?: Never  Services provided included: MD, RD, PT, OT, SLP, RN, CM, Pharmacy, TR, Neuropsych, SW  Financial Services:       Choices offered to/list presented to: Patient and Daughter  Follow-up services arranged:  Outpatient    Outpatient Servicies: OP at [facility]      Patient response to transportation need: Is the patient able to respond to transportation needs?: Yes In the past 12 months, has lack of transportation kept you from medical appointments or from getting medications?: No In the past 12 months, has lack of transportation kept you from meetings, work, or from getting things needed for daily living?: No    Comments (or additional information):  Patient/Family verbalized understanding of follow-up arrangements:  Yes  Individual responsible for coordination of the follow-up plan: Elmo Putt [phone]  Confirmed correct DME delivered: Dyanne Iha 01/12/2022    Dyanne Iha

## 2022-01-12 NOTE — Progress Notes (Incomplete)
Inpatient Rehabilitation Discharge Medication Review by a Pharmacist  A complete drug regimen review was completed for this patient to identify any potential clinically significant medication issues.  High Risk Drug Classes Is patient taking? Indication by Medication  Antipsychotic {Receiving?:26196}   Anticoagulant {Receiving?:26196}   Antibiotic {Receiving?:26196}   Opioid {Receiving?:26196}   Antiplatelet {Receiving?:26196}   Hypoglycemics/insulin {Yes or No?:26198}   Vasoactive Medication {Receiving?:26196}   Chemotherapy {Receiving Chemo?:26197}   Other {Yes or No?:26198}      Type of Medication Issue Identified Description of Issue Recommendation(s)  Drug Interaction(s) (clinically significant)     Duplicate Therapy     Allergy     No Medication Administration End Date     Incorrect Dose     Additional Drug Therapy Needed     Significant med changes from prior encounter (inform family/care partners about these prior to discharge).    Other       Clinically significant medication issues were identified that warrant physician communication and completion of prescribed/recommended actions by midnight of the next day:  {Yes or No?:26198}  Name of provider notified for urgent issues identified: ***   Provider Method of Notification: ***    Pharmacist comments: ***   Time spent performing this drug regimen review (minutes): 20   Thank you for allowing pharmacy to be a part of this patient's care.  Ardyth Harps, PharmD Clinical Pharmacist

## 2022-01-12 NOTE — Progress Notes (Signed)
Occupational Therapy Session Note  Patient Details  Name: Mark Jackson. MRN: [id number] Date of Birth: November 27, 1929  Today's Date: 01/12/2022 OT Individual Time: 1400-1500 OT Individual Time Calculation (min): 60 min    Short Term Goals: Week 2:  OT Short Term Goal 2 (Week 2): STG = LTG 2/2 ELOS  Skilled Therapeutic Interventions/Progress Updates:    Pt's daughter present at start of session and reviewed with her safety concerns about pt being home alone as he is still at a high fall risk and had several LOB in earlier session.   Pt agreeable to working on balance. He used his RW to ambulate to gym where he worked on sit to stands without UE support holding a ball, standing and reaching to knee level to simulate pulling up pants, standing with RW support for ankle mobility and heel raises.  Focused on safe forward wt shift to allow him to stand without using his legs against the back of the surface he is standing in front of.   Seated trunk rotation and B shoulder exercises with dowel bar.   Pt ambulated with RW well over 300 ft, took a rest break and then ambulated back to his room.  Pt used RW to access bathroom with CGA and then S to prepare for toileting. Pt requested time to sit to toilet and that he would pull the call light.  His NT and RN aware.      Therapy Documentation Precautions:  Precautions Precautions: Fall Precaution Comments: R hemiparesis, mildly impulsive Restrictions Weight Bearing Restrictions: No  Pain: Pain Assessment Pain Scale: 0-10 Pain Score: 0-No pain    Therapy/Group: Individual Therapy  [place] 01/12/2022, 1:01 PM

## 2022-01-12 NOTE — Progress Notes (Signed)
Physical Therapy Discharge Summary  Patient Details  Name: Mark Jackson. MRN: [id number] Date of Birth: [birth year]  Today's Date: 01/12/2022 PT Individual Time: 0945-1100 PT Individual Time Calculation (min): 75 min    Patient has met 10 of 10 long term goals due to improved activity tolerance, improved balance, improved postural control, increased strength, ability to compensate for deficits, improved attention, improved awareness, and improved coordination.  Patient to discharge at an ambulatory level  cga .   Patient's care partner/family memebers are/is independent to provide the necessary physical assistance at discharge.  Family training completed 6/4.  Reasons goals not met: na  Recommendation:  Patient will benefit from ongoing skilled PT services in outpatient setting to continue to advance safe functional mobility, address ongoing impairments in balance, hemiparesis, safety awareness, gait, and minimize fall risk.  Equipment: No equipment provided  Reasons for discharge: treatment goals met  Patient/family agrees with progress made and goals achieved: Yes Session w/focus on functional mobility assessment, gait training trials, and balance training.   See below for reassessment.   Activities included transfer training from apartment furniture, bed mobility on standard bed, picking up objects from floor w/reacher, stairs, gait trials.  See caretool and reassessment below for details. Gait : 238ft x 1, 188ft x 1, 25ft x 1 w/RW, cga, gait deviations as described below, cues to attend to signs of fatigue/education of fall risk w/fatigue mult occasions of mild balance loss due to distractions or RLE impaired clearance, cga for recovery.  Standing balance performed in apartment via reaching task in pantry requiring forward reach and overhead reach transitioning between, cga, RW for support, cues to attend to post tendency and safe pacing w/activity.  No LOB.   PT  Discharge Precautions/Restrictions Precautions Precautions: Fall Precaution Comments: R hemiparesis, mildly impulsive Restrictions Weight Bearing Restrictions: No Pain Pain Assessment Pain Scale: 0-10 Pain Score: 0-No pain Pain Interference Pain Interference Pain Effect on Sleep: 2. Occasionally Pain Interference with Therapy Activities: 0. Does not apply - I have not received rehabilitationtherapy in the past 5 days;1. Rarely or not at all Pain Interference with Day-to-Day Activities: 1. Rarely or not at all Vision/Perception  Vision - History Ability to See in Adequate Light: 0 Adequate  Cognition Overall Cognitive Status: Within Functional Limits for tasks assessed Arousal/Alertness: Awake/alert Orientation Level: Oriented X4 Memory Impairment: Decreased recall of new information Sensation Sensation Light Touch: Appears Intact Proprioception: Appears Intact Coordination Gross Motor Movements are Fluid and Coordinated: No Fine Motor Movements are Fluid and Coordinated: No Coordination and Movement Description: RLE>RUE hemiparesis Finger Nose Finger Test: slower on the R with mild undershooting Heel Shin Test: mild slowing R vs L Motor  Motor Motor: Abnormal postural alignment and control;Other (comment) Motor - Skilled Clinical Observations: R hemipareisis, generalized weakness Motor - Discharge Observations: RLE> RUE hemiparesis, generalized weakness but improved from eval  Mobility Bed Mobility Bed Mobility: Sit to Supine;Supine to Sit Supine to Sit: Supervision/Verbal cueing Sit to Supine: Supervision/Verbal cueing Transfers Transfers: Sit to Stand;Stand to Sit;Stand Pivot Transfers Sit to Stand: Supervision/Verbal cueing Stand to Sit: Supervision/Verbal cueing Stand Pivot Transfers: Supervision/Verbal cueing Stand Pivot Transfer Details: Verbal cues for technique Transfer (Assistive device): Rolling walker Locomotion  Gait Gait Assistance: Contact  Guard/Touching assist;Supervision/Verbal cueing Gait Distance (Feet): 150 Feet Assistive device: Rolling walker Gait Assistance Details: Verbal cues for precautions/safety;Other (comment) Gait Assistance Details: cues to attend to RLE clearance/change in surfaces Gait Gait: Yes Gait Pattern:  (decreased R knee flexion thru swing w/occasional decreased  celarance, epsodes of mild "ripping" w/fatigue, R knee extension thrust w/loading, overall flexed posture w/downward gaze, cues for improved upright.) Gait velocity: reduced Stairs / Additional Locomotion Stairs: Yes Stairs Assistance: Supervision/Verbal cueing (cues for step to/sequencing required) Stair Management Technique: Two rails;Step to pattern Number of Stairs: 4 Height of Stairs: 6 Pick up small object from the floor assist level: Minimal Assistance - Patient > 75% Pick up small object from the floor assistive device: reacher Wheelchair Mobility Wheelchair Mobility: No  Trunk/Postural Assessment  Cervical Assessment Cervical Assessment: Exceptions to [facility] Thoracic Assessment Thoracic Assessment: Exceptions to [facility] Lumbar Assessment Lumbar Assessment: Exceptions to [facility] Postural Control Postural Control: Deficits on evaluation Righting Reactions: w/post LOB pt w/absent stepping, delayed ankle, absent UE reactions Protective Responses: severe delay  Balance Balance Balance Assessed: Yes Static Sitting Balance Static Sitting - Balance Support: No upper extremity supported;Feet supported Static Sitting - Level of Assistance: 7: Independent Dynamic Sitting Balance Dynamic Sitting - Balance Support: Feet supported Dynamic Sitting - Level of Assistance: 7: Independent;Other (comment) (supervision due to impulsiveity) Dynamic Sitting - Balance Activities: Lateral lean/weight shifting;Reaching for objects;Forward lean/weight shifting Sitting balance - Comments: supervision Static Standing Balance Static Standing - Balance  Support: During functional activity;Bilateral upper extremity supported Static Standing - Level of Assistance: 5: Stand by assistance Static Standing - Comment/# of Minutes: dynamic standing cga to min assist due to post tendency w/delayed reactions Dynamic Standing Balance Dynamic Standing - Balance Support: During functional activity;Bilateral upper extremity supported Dynamic Standing - Level of Assistance: 5: Stand by assistance;4: Min assist Dynamic Standing - Balance Activities: Lateral lean/weight shifting;Reaching for objects;Forward lean/weight shifting;Other (comment) (backing) Dynamic Standing - Comments: loses balance posteriorly w/crossbody reaching and backing Extremity Assessment  RUE Assessment RUE Assessment: Within Functional Limits General Strength Comments: mild HP, but grossly WFL for BADL LUE Assessment LUE Assessment: Within Functional Limits RLE Assessment RLE Assessment: Exceptions to [facility] General Strength Comments: hip 3-/5, knee 4/+/5 quad, 4-/5 hams, DF 4/5 RLE Strength RLE Overall Strength: Deficits RLE Overall Strength Comments: functional deficits w/gait, see gait assessment. Right Hip Flexion: 3-/5 Right Hip Extension: 3-/5 Right Hip ABduction: 3-/5 Right Hip ADduction: 4-/5 Right Knee Flexion: 4-/5 Right Knee Extension: 4+/5 Right Ankle Dorsiflexion: 4/5 Right Ankle Plantar Flexion: 3+/5 LLE Assessment General Strength Comments: quads 5/5, hams 4/5, hip 4- to 3+/5 LLE Strength LLE Overall Strength: Deficits    Jerrilyn Cairo 01/12/2022, 10:56 AM

## 2022-01-12 NOTE — Progress Notes (Signed)
Occupational Therapy Session Note  Patient Details  Name: Mark Jackson. MRN: [id number] Date of Birth: 1930/02/02  Today's Date: 01/12/2022 OT Individual Time: 0177-9390 OT Individual Time Calculation (min): 60 min    Short Term Goals: Week 2:  OT Short Term Goal 2 (Week 2): STG = LTG 2/2 ELOS  Skilled Therapeutic Interventions/Progress Updates:    S: Pt reports that he is ready to go home.   O: Patient completed ADL task at sink level per his request. Requested to ambulate from bed to sink with RW at contact guard assist.  Preferred to stand at sink to complete bathing with 2-3 episodes of LOB to the right side. Required light min assist to correct 2 out of the the 3. Able to self correct one LOB independently.  Dressing was completed sitting and standing.    A: Provided education and cues to patient throughout ADL to help increase patient's independence and safety when at home as he will be alone for most of the day. Patient reports that the plan is to have one of his children stay with him at night for a few weeks. Patient is aware of his right side weakness and verbalizes it frequently. Regardless of his weakness and awareness of it, patient continued to complete a good portion of his ADL standing which put more demand on maintaining his balance all while trying to bath or dress himself.    P: Plan for discharge tomorrow. Continue to encourage activity modifications when able to help increase safety at home.   Therapy Documentation Precautions:  Precautions Precautions: Fall Precaution Comments: R hemiparesis RLE>RUE Restrictions Weight Bearing Restrictions: No  Pain: Pain Assessment Pain Scale: 0-10 Pain Score: 0-No pain ADL:    01/12/22 0911  Cognition  Overall Cognitive Status Within Functional Limits for tasks assessed  Arousal/Alertness Awake/alert  Orientation Level Person;Place;Situation  Memory Impairment Decreased recall of new information  Brief  Interview for Mental Status (BIMS)  Repetition of Three Words (First Attempt) 3  Temporal Orientation: Year Correct  Temporal Orientation: Month Accurate within 5 days  Temporal Orientation: Day Correct  Recall: "Sock" Yes, no cue required  Recall: "Blue" Yes, no cue required  Recall: "Bed" Yes, no cue required  BIMS Summary Score 15    Therapy/Group: Individual Therapy  Ailene Ravel, OTR/L,CBIS  Supplemental OT - [place] and [facility]  01/12/2022, 9:16 AM

## 2022-01-13 MED ORDER — METOLAZONE 2.5 MG PO TABS
2.5000 mg | ORAL_TABLET | Freq: Once | ORAL | Status: AC
  Administered 2022-01-13: 2.5 mg via ORAL
  Filled 2022-01-13: qty 1

## 2022-01-13 MED ORDER — TORSEMIDE 20 MG PO TABS
20.0000 mg | ORAL_TABLET | Freq: Once | ORAL | Status: DC
Start: 2022-01-13 — End: 2022-01-13

## 2022-01-13 MED ORDER — TORSEMIDE 20 MG PO TABS
20.0000 mg | ORAL_TABLET | Freq: Every day | ORAL | Status: DC
Start: 2022-01-13 — End: 2022-01-13

## 2022-01-13 NOTE — Progress Notes (Signed)
Inpatient Rehabilitation Discharge Medication Review by a Pharmacist  A complete drug regimen review was completed for this patient to identify any potential clinically significant medication issues.  High Risk Drug Classes Is patient taking? Indication by Medication  Antipsychotic No   Anticoagulant Yes Apixaban: Afib/stroke ppx  Antibiotic No   Opioid No   Antiplatelet No   Hypoglycemics/insulin No   Vasoactive Medication Yes Diltiazem: BP, afib Furosemide: CHF, BP Imdur: BP, CHF Nitroglycerin: PRN chest pain  Chemotherapy No   Other Yes Diclofenac gel: topical pain relief Ezetimibe, rosuvastatin: hyperlipidemia Topiramate: BPH Ferrous sulfate vitamin B12: supplement     Type of Medication Issue Identified Description of Issue Recommendation(s)  Drug Interaction(s) (clinically significant)     Duplicate Therapy     Allergy     No Medication Administration End Date     Incorrect Dose     Additional Drug Therapy Needed     Significant med changes from prior encounter (inform family/care partners about these prior to discharge). New medications: - ezetimibe  Discontinued medications: - clopidogrel Discuss with patient/family medication changes  Other       Clinically significant medication issues were identified that warrant physician communication and completion of prescribed/recommended actions by midnight of the next day:  No   Pharmacist comments: n/a   Time spent performing this drug regimen review (minutes): 20   Thank you for allowing pharmacy to be a part of this patient's care.  Ardyth Harps, PharmD Clinical Pharmacist

## 2022-01-13 NOTE — Progress Notes (Signed)
PROGRESS NOTE   Subjective/Complaints:   DIscussed CVA etiology   ROS:  Pt denies SOB, CP, abd pain, N/V/C/D, vision changes, or headache.  Objective:   No results found. Recent Labs    01/12/22 0603  WBC 10.9*  HGB 9.5*  HCT 28.3*  PLT 206    Recent Labs    01/12/22 0603  NA 140  K 4.8  CL 116*  CO2 20*  GLUCOSE 97  BUN 67*  CREATININE 2.77*  CALCIUM 8.2*     Intake/Output Summary (Last 24 hours) at 01/13/2022 0729 Last data filed at 01/13/2022 0406 Gross per 24 hour  Intake 240 ml  Output 800 ml  Net -560 ml         Physical Exam: Vital Signs Blood pressure (!) 151/72, pulse 76, temperature 98.5 F (36.9 C), temperature source Oral, resp. rate 18, height '5\' 8"'$  (1.727 m), weight 82 kg, SpO2 96 %.   General: No acute distress Mood and affect are appropriate Heart: Regular rate and rhythm no rubs murmurs or extra sounds Lungs: Clear to auscultation, breathing unlabored, no rales or wheezes Abdomen: Positive bowel sounds, soft nontender to palpation, nondistended Extremities: No clubbing, cyanosis, or edema Skin: No evidence of breakdown, no evidence of rash  Neuro: CN II-XII intact. Awake and alert, makes eye contact with examiner.  Some mildly dysarthric speech but fully intelligible.  Oriented x 3. Insight and awareness is reasonable.  Rt limb ataxia with past pointing.  Memory is functional. RLE mild ataxia.  RUE 4/5. LUE 4+/5. RLE 4/5 prox to distal. LLE 4/5 with left knee pain inhibition.  Sensory intact to LT, pain. DTR's +1. No abnormal tone. Musculoskeletal: FROM normal all 4 extremities. Right knee w TKA scar. Left knee effusion,+crepitus, tender AROM/PROM, chronic.  Assessment/Plan: 1. Functional deficits due to CVA Stable for D/C today F/u PCP in 3-4 weeks F/u PM&R 2 weeks See D/C summary See D/C instructions   Care Tool:  Bathing    Body parts bathed by patient: Right arm, Left  arm, Abdomen, Chest, Front perineal area, Buttocks, Right upper leg, Left upper leg, Right lower leg, Left lower leg   Body parts bathed by helper: Buttocks (feet, back)     Bathing assist Assist Level: Contact Guard/Touching assist     Upper Body Dressing/Undressing Upper body dressing   What is the patient wearing?: Pull over shirt    Upper body assist Assist Level: Minimal Assistance - Patient > 75%    Lower Body Dressing/Undressing Lower body dressing      What is the patient wearing?: Underwear/pull up, Pants     Lower body assist Assist for lower body dressing: Contact Guard/Touching assist     Toileting Toileting    Toileting assist Assist for toileting: Supervision/Verbal cueing Assistive Device Comment: RW and urinal   Transfers Chair/bed transfer  Transfers assist     Chair/bed transfer assist level: Contact Guard/Touching assist     Locomotion Ambulation   Ambulation assist      Assist level: Contact Guard/Touching assist Assistive device: Walker-rolling Max distance: 210   Walk 10 feet activity   Assist     Assist level: Supervision/Verbal cueing Assistive  device: Walker-rolling   Walk 50 feet activity   Assist Walk 50 feet with 2 turns activity did not occur: Safety/medical concerns  Assist level: Contact Guard/Touching assist Assistive device: Walker-rolling    Walk 150 feet activity   Assist Walk 150 feet activity did not occur: Safety/medical concerns  Assist level: Contact Guard/Touching assist Assistive device: Walker-rolling    Walk 10 feet on uneven surface  activity   Assist Walk 10 feet on uneven surfaces activity did not occur: Safety/medical concerns   Assist level: Contact Guard/Touching assist Assistive device: Walker-rolling   Wheelchair     Assist Is the patient using a wheelchair?: No Type of Wheelchair: Manual Wheelchair activity did not occur: N/A  Wheelchair assist level: Total Assistance  - Patient < 25% Max wheelchair distance: 250 ft    Wheelchair 50 feet with 2 turns activity    Assist    Wheelchair 50 feet with 2 turns activity did not occur: N/A   Assist Level: Total Assistance - Patient < 25%   Wheelchair 150 feet activity     Assist  Wheelchair 150 feet activity did not occur: N/A   Assist Level: Total Assistance - Patient < 25%   Blood pressure (!) 151/72, pulse 76, temperature 98.5 F (36.9 C), temperature source Oral, resp. rate 18, height '5\' 8"'$  (1.727 m), weight 82 kg, SpO2 96 %.  Medical Problem List and Plan:likely cardioembolic  1. Functional deficits secondary to left thalamic infarction and small left cerebellar infarct thalamic CVA most likely SVD although 2 locations suggests cardioembolic - f/u neuro              -patient may shower             -ELOS/Goals: 01/13/22 S to mod I             Con't CIR- PT, OT and SLP    2.  Antithrombotics: -DVT/anticoagulation:  Pharmaceutical: Other (comment) Eliquis             -antiplatelet therapy: N/A 3. Pain Management: Tylenol as needed             -add voltaren gel QID for endstage OA left knee             -5/27- pain just "there"- doing OK- will con't voltaren gel             -6/1 Appears to be using PRN tylenol, continue to follow 6/4 No issues with pain management 4. Mood: Provide emotional support             -antipsychotic agents: N/A 5. Neuropsych: This patient is capable of making decisions on his own behalf. 6. Skin/Wound Care: Routine skin checks 7. Fluids/Electrolytes/Nutrition: Routine in and outs with follow-up chemistries 8.  Atrial fibrillation.  Cardizem 120 mg daily.  Continue Eliquis.  Cardiac rate controlled at present    01/13/2022    4:58 AM 01/13/2022    4:02 AM 01/12/2022    7:30 PM  Vitals with BMI  Weight 180 lbs 12 oz    BMI 33.82    Systolic  505 397  Diastolic  72 76  Pulse  76 73   6/6 Fair BP control with 240 mg Cardizem daily, may need more time to trend dose  effectiveness. 9.  Hyperlipidemia.  Crestor 10.  Diastolic congestive heart failure.  Lasix 20 mg daily.  Monitor for any signs of fluid overload             -  daily weights             -Weight appears overall stable, continue to monitor          6/4 Weight stable Filed Weights   01/10/22 0457 01/11/22 0500 01/13/22 0458  Weight: 82.9 kg 82.1 kg 82 kg   5/27- made sure has daily weights- will monitor- 5/28- ordered- will double check with nursing- don't se ein computer  11.  CAD/stenting/TAVR.  Continue Imdur 60 mg daily 12.  CKD stage IV. Stable labs    Latest Ref Rng & Units 01/12/2022    6:03 AM 01/10/2022    7:16 AM 01/08/2022    5:16 AM  BMP  Glucose 70 - 99 mg/dL 97   98   95    BUN 8 - 23 mg/dL 67   68   70    Creatinine 0.61 - 1.24 mg/dL 2.77   2.72   2.93    Sodium 135 - 145 mmol/L 140   142   142    Potassium 3.5 - 5.1 mmol/L 4.8   4.7   4.6    Chloride 98 - 111 mmol/L 116   116   118    CO2 22 - 32 mmol/L '20   20   19    '$ Calcium 8.9 - 10.3 mg/dL 8.2   8.5   8.4     Renal status at baseline  13.  History of migraine headaches.  Topamax 150 mg nightly 14.  Chronic anemia.  On anticoagulation check stool OB , MCV on high side but nl B12 , pt states he was getting B12 shots as an outpatient last one was 5/16, can get next injection as OP  6/5 Hgb stable at 9.5    Latest Ref Rng & Units 01/12/2022    6:03 AM 01/05/2022    5:59 AM 12/30/2021    5:32 AM  CBC  WBC 4.0 - 10.5 K/uL 10.9   10.5   11.6    Hemoglobin 13.0 - 17.0 g/dL 9.5   9.8   10.0    Hematocrit 39.0 - 52.0 % 28.3   29.6   29.5    Platelets 150 - 400 K/uL 206   195   127     15.  BPH with history of prostate cancer.  Check PVR and follow for patterns. Pt denies any emptying issues at present Emptying bladder   16.  LE swelling likely related to CKD, hypoalb, ECHO with nl EF fx could not determine diastolic fxn , give extra '40mg'$  Lasix x 1 , Knee hi TED hose   LOS: 15 days A FACE TO FACE EVALUATION WAS  PERFORMED  Charlett Blake 01/13/2022, 7:29 AM

## 2022-01-13 NOTE — Progress Notes (Signed)
INPATIENT REHABILITATION DISCHARGE NOTE   Discharge instructions by: Lauraine Rinne  Verbalized understanding: by patient and family member  Skin care/Wound care healing? No wounds  Pain: no pain  IV's: none  Tubes/Drains: none   O2: room air  Safety instructions: Fall precautions   Patient belongings: gathered by staff, given to family member  Discharged to: home  Discharged via: private vehicle    Notes:

## 2022-01-16 ENCOUNTER — Ambulatory Visit (INDEPENDENT_AMBULATORY_CARE_PROVIDER_SITE_OTHER): Payer: Medicare Other | Admitting: Neurology

## 2022-01-16 ENCOUNTER — Encounter: Payer: Self-pay | Admitting: Neurology

## 2022-01-16 VITALS — BP 150/80 | HR 98 | Ht 69.0 in | Wt 182.2 lb

## 2022-01-16 DIAGNOSIS — I480 Paroxysmal atrial fibrillation: Secondary | ICD-10-CM

## 2022-01-16 DIAGNOSIS — I6381 Other cerebral infarction due to occlusion or stenosis of small artery: Secondary | ICD-10-CM | POA: Diagnosis not present

## 2022-01-16 DIAGNOSIS — E785 Hyperlipidemia, unspecified: Secondary | ICD-10-CM | POA: Diagnosis not present

## 2022-01-16 DIAGNOSIS — I10 Essential (primary) hypertension: Secondary | ICD-10-CM

## 2022-01-16 DIAGNOSIS — I6521 Occlusion and stenosis of right carotid artery: Secondary | ICD-10-CM
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
## 2022-01-16 NOTE — Patient Instructions (Addendum)
Continue Eliquis Continue statin Blood pressure control Physical therapy Recommend annual carotid ultrasound Follow up 6 months.

## 2022-01-20 ENCOUNTER — Institutional Professional Consult (permissible substitution): Payer: Medicare Other | Admitting: Adult Health

## 2022-01-26 ENCOUNTER — Ambulatory Visit: Payer: Medicare Other | Attending: Physician Assistant | Admitting: Occupational Therapy

## 2022-01-26 ENCOUNTER — Telehealth: Payer: Self-pay | Admitting: Oncology

## 2022-01-26 ENCOUNTER — Other Ambulatory Visit: Payer: Self-pay

## 2022-01-26 ENCOUNTER — Encounter: Payer: Self-pay | Admitting: Occupational Therapy

## 2022-01-26 ENCOUNTER — Encounter: Payer: Self-pay | Admitting: Physical Therapy

## 2022-01-26 ENCOUNTER — Ambulatory Visit: Payer: Medicare Other | Admitting: Physical Therapy

## 2022-01-26 DIAGNOSIS — R278 Other lack of coordination: Secondary | ICD-10-CM | POA: Diagnosis not present

## 2022-01-26 DIAGNOSIS — R2689 Other abnormalities of gait and mobility: Secondary | ICD-10-CM | POA: Insufficient documentation

## 2022-01-26 DIAGNOSIS — M6281 Muscle weakness (generalized): Secondary | ICD-10-CM

## 2022-01-26 DIAGNOSIS — I69351 Hemiplegia and hemiparesis following cerebral infarction affecting right dominant side: Secondary | ICD-10-CM | POA: Insufficient documentation

## 2022-01-26 DIAGNOSIS — R2681 Unsteadiness on feet: Secondary | ICD-10-CM
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
## 2022-01-26 NOTE — Telephone Encounter (Signed)
Called patient regarding upcoming August appointment, patient has been called and notified.  

## 2022-01-29 ENCOUNTER — Ambulatory Visit: Payer: Medicare Other | Admitting: Physical Therapy

## 2022-01-29 ENCOUNTER — Encounter: Payer: Self-pay | Admitting: Physical Therapy

## 2022-01-29 DIAGNOSIS — R2681 Unsteadiness on feet: Secondary | ICD-10-CM | POA: Diagnosis not present

## 2022-01-29 DIAGNOSIS — M6281 Muscle weakness (generalized): Secondary | ICD-10-CM | POA: Diagnosis not present

## 2022-01-29 DIAGNOSIS — R2689 Other abnormalities of gait and mobility: Secondary | ICD-10-CM | POA: Diagnosis not present

## 2022-01-29 DIAGNOSIS — R278 Other lack of coordination: Secondary | ICD-10-CM | POA: Diagnosis not present

## 2022-01-29 DIAGNOSIS — I69351 Hemiplegia and hemiparesis following cerebral infarction affecting right dominant side: Secondary | ICD-10-CM | POA: Diagnosis not present
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
## 2022-02-03 ENCOUNTER — Encounter: Payer: Self-pay | Admitting: Physical Therapy

## 2022-02-03 ENCOUNTER — Ambulatory Visit: Payer: Medicare Other | Admitting: Physical Therapy

## 2022-02-03 DIAGNOSIS — M6281 Muscle weakness (generalized): Secondary | ICD-10-CM | POA: Diagnosis not present

## 2022-02-03 DIAGNOSIS — R2681 Unsteadiness on feet: Secondary | ICD-10-CM | POA: Diagnosis not present

## 2022-02-03 DIAGNOSIS — R2689 Other abnormalities of gait and mobility: Secondary | ICD-10-CM | POA: Diagnosis not present

## 2022-02-03 DIAGNOSIS — I69351 Hemiplegia and hemiparesis following cerebral infarction affecting right dominant side: Secondary | ICD-10-CM | POA: Diagnosis not present

## 2022-02-03 DIAGNOSIS — R278 Other lack of coordination: Secondary | ICD-10-CM | POA: Diagnosis not present

## 2022-02-04 ENCOUNTER — Encounter: Payer: Self-pay | Admitting: Primary Care

## 2022-02-04 ENCOUNTER — Ambulatory Visit (INDEPENDENT_AMBULATORY_CARE_PROVIDER_SITE_OTHER): Payer: Medicare Other | Admitting: Primary Care

## 2022-02-04 VITALS — BP 162/72 | HR 72 | Temp 98.1°F | Ht 68.0 in | Wt 191.4 lb

## 2022-02-04 DIAGNOSIS — I639 Cerebral infarction, unspecified: Secondary | ICD-10-CM

## 2022-02-04 DIAGNOSIS — I6521 Occlusion and stenosis of right carotid artery: Secondary | ICD-10-CM

## 2022-02-04 DIAGNOSIS — R0683 Snoring: Secondary | ICD-10-CM | POA: Diagnosis not present

## 2022-02-04 DIAGNOSIS — I480 Paroxysmal atrial fibrillation: Secondary | ICD-10-CM

## 2022-02-04 NOTE — Patient Instructions (Signed)
Sleep apnea is defined as period of 10 seconds or longer when you stop breathing at night. This can happen multiple times a night. Dx sleep apnea is when this occurs more than 5 times an hour.    Mild OSA 5-15 apneic events an hour Moderate OSA 15-30 apneic events an hour Severe OSA > 30 apneic events an hour   Untreated sleep apnea puts you at higher risk for cardiac arrhythmias, pulmonary HTN, stroke and diabetes   Treatment options include weight loss, side sleeping position, oral appliance, CPAP therapy or referral to ENT for possible surgical options    Recommendations: Focus on side sleeping position Do not drive if experiencing excessive daytime sleepiness of fatigue    Orders: In lab sleep study DT:[id number] (they will contact you to schedule)   Follow-up: 6 week virtual/televisit with Eustaquio Maize NP to review sleep study results and treatment if needed    Sleep Apnea Sleep apnea affects breathing during sleep. It causes breathing to stop for 10 seconds or more, or to become shallow. People with sleep apnea usually snore loudly. It can also increase the risk of: Heart attack. Stroke. Being very overweight (obese). Diabetes. Heart failure. Irregular heartbeat. High blood pressure. The goal of treatment is to help you breathe normally again. What are the causes?  The most common cause of this condition is a collapsed or blocked airway. There are three kinds of sleep apnea: Obstructive sleep apnea. This is caused by a blocked or collapsed airway. Central sleep apnea. This happens when the brain does not send the right signals to the muscles that control breathing. Mixed sleep apnea. This is a combination of obstructive and central sleep apnea. What increases the risk? Being overweight. Smoking. Having a small airway. Being older. Being male. Drinking alcohol. Taking medicines to calm yourself (sedatives or tranquilizers). Having family members with the condition. Having  a tongue or tonsils that are larger than normal. What are the signs or symptoms? Trouble staying asleep. Loud snoring. Headaches in the morning. Waking up gasping. Dry mouth or sore throat in the morning. Being sleepy or tired during the day. If you are sleepy or tired during the day, you may also: Not be able to focus your mind (concentrate). Forget things. Get angry a lot and have mood swings. Feel sad (depressed). Have changes in your personality. Have less interest in sex, if you are male. Be unable to have an erection, if you are male. How is this treated?  Sleeping on your side. Using a medicine to get rid of mucus in your nose (decongestant). Avoiding the use of alcohol, medicines to help you relax, or certain pain medicines (narcotics). Losing weight, if needed. Changing your diet. Quitting smoking. Using a machine to open your airway while you sleep, such as: An oral appliance. This is a mouthpiece that shifts your lower jaw forward. A CPAP device. This device blows air through a mask when you breathe out (exhale). An EPAP device. This has valves that you put in each nostril. A BIPAP device. This device blows air through a mask when you breathe in (inhale) and breathe out. Having surgery if other treatments do not work. Follow these instructions at home: Lifestyle Make changes that your doctor recommends. Eat a healthy diet. Lose weight if needed. Avoid alcohol, medicines to help you relax, and some pain medicines. Do not smoke or use any products that contain nicotine or tobacco. If you need help quitting, ask your doctor. General instructions Take over-the-counter and  prescription medicines only as told by your doctor. If you were given a machine to use while you sleep, use it only as told by your doctor. If you are having surgery, make sure to tell your doctor you have sleep apnea. You may need to bring your device with you. Keep all follow-up visits. Contact a  doctor if: The machine that you were given to use during sleep bothers you or does not seem to be working. You do not get better. You get worse. Get help right away if: Your chest hurts. You have trouble breathing in enough air. You have an uncomfortable feeling in your back, arms, or stomach. You have trouble talking. One side of your body feels weak. A part of your face is hanging down. These symptoms may be an emergency. Get help right away. Call your local emergency services (911 in the [place] Do not wait to see if the symptoms will go away. Do not drive yourself to the hospital. Summary This condition affects breathing during sleep. The most common cause is a collapsed or blocked airway. The goal of treatment is to help you breathe normally while you sleep. This information is not intended to replace advice given to you by your health care provider. Make sure you discuss any questions you have with your health care provider. Document Revised: 03/05/2021 Document Reviewed: 07/05/2020 Elsevier Patient Education  [place].

## 2022-02-04 NOTE — Assessment & Plan Note (Addendum)
-   Patient has symptoms snoring, excessive daytime sleepiness. Hx AFIB and stroke. Epworth 13.  Strong suspicion patient could have obstructive sleep apnea, needs in lab sleep study to evaluate. Discussed risk of untreated sleep apnea including cardiac arrhythmias, pulm HTN, stroke, DM. We briefly reviewed treatment options. Encouraged patient to work on weight loss efforts and focus on side sleeping position/elevate head of bed. Advised against driving if experiencing excessive daytime sleepiness. Follow-up in 4-6 weeks to review sleep study results and discuss treatment options further
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
## 2022-02-05 ENCOUNTER — Ambulatory Visit: Payer: Medicare Other

## 2022-02-05 DIAGNOSIS — R2689 Other abnormalities of gait and mobility: Secondary | ICD-10-CM | POA: Diagnosis not present

## 2022-02-05 DIAGNOSIS — R2681 Unsteadiness on feet: Secondary | ICD-10-CM

## 2022-02-05 DIAGNOSIS — R278 Other lack of coordination: Secondary | ICD-10-CM | POA: Diagnosis not present

## 2022-02-05 DIAGNOSIS — I69351 Hemiplegia and hemiparesis following cerebral infarction affecting right dominant side: Secondary | ICD-10-CM

## 2022-02-05 DIAGNOSIS — M6281 Muscle weakness (generalized): Secondary | ICD-10-CM | POA: Diagnosis not present
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
## 2022-02-06 ENCOUNTER — Encounter: Payer: Medicare Other | Admitting: Physical Medicine & Rehabilitation

## 2022-02-09 ENCOUNTER — Ambulatory Visit: Payer: Medicare Other | Attending: Physician Assistant

## 2022-02-09 DIAGNOSIS — R278 Other lack of coordination: Secondary | ICD-10-CM | POA: Insufficient documentation

## 2022-02-09 DIAGNOSIS — M6281 Muscle weakness (generalized): Secondary | ICD-10-CM | POA: Insufficient documentation

## 2022-02-09 DIAGNOSIS — R2681 Unsteadiness on feet: Secondary | ICD-10-CM | POA: Insufficient documentation

## 2022-02-09 DIAGNOSIS — R2689 Other abnormalities of gait and mobility: Secondary | ICD-10-CM | POA: Insufficient documentation

## 2022-02-09 DIAGNOSIS — I69351 Hemiplegia and hemiparesis following cerebral infarction affecting right dominant side: Secondary | ICD-10-CM | POA: Insufficient documentation
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
## 2022-02-11 ENCOUNTER — Ambulatory Visit: Payer: Medicare Other | Admitting: Occupational Therapy

## 2022-02-11 DIAGNOSIS — R2681 Unsteadiness on feet: Secondary | ICD-10-CM

## 2022-02-11 DIAGNOSIS — R278 Other lack of coordination: Secondary | ICD-10-CM | POA: Diagnosis not present

## 2022-02-11 DIAGNOSIS — R2689 Other abnormalities of gait and mobility: Secondary | ICD-10-CM | POA: Diagnosis not present

## 2022-02-11 DIAGNOSIS — I69351 Hemiplegia and hemiparesis following cerebral infarction affecting right dominant side: Secondary | ICD-10-CM

## 2022-02-11 DIAGNOSIS — M6281 Muscle weakness (generalized): Secondary | ICD-10-CM
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
## 2022-02-12 ENCOUNTER — Ambulatory Visit: Payer: Medicare Other | Admitting: Physical Therapy

## 2022-02-12 ENCOUNTER — Ambulatory Visit: Payer: Medicare Other | Admitting: Occupational Therapy

## 2022-02-12 ENCOUNTER — Encounter: Payer: Self-pay | Admitting: Physical Therapy

## 2022-02-12 ENCOUNTER — Telehealth: Payer: Self-pay | Admitting: Pharmacist

## 2022-02-12 DIAGNOSIS — R2681 Unsteadiness on feet: Secondary | ICD-10-CM

## 2022-02-12 DIAGNOSIS — I69351 Hemiplegia and hemiparesis following cerebral infarction affecting right dominant side: Secondary | ICD-10-CM | POA: Diagnosis not present

## 2022-02-12 DIAGNOSIS — R2689 Other abnormalities of gait and mobility: Secondary | ICD-10-CM

## 2022-02-12 DIAGNOSIS — M6281 Muscle weakness (generalized): Secondary | ICD-10-CM

## 2022-02-12 DIAGNOSIS — R278 Other lack of coordination: Secondary | ICD-10-CM | POA: Diagnosis not present

## 2022-02-12 NOTE — Assessment & Plan Note (Addendum)
-   Maintained on Diltiazem and Eliquis
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
## 2022-02-12 NOTE — Assessment & Plan Note (Signed)
-   Admitted in May 2023 for small left thalamic and small left cerebellar infarct. He did not receive tPA - Maintained on Eliquis and crestor

## 2022-02-12 NOTE — Progress Notes (Signed)
Chronic Care Management Pharmacy Assistant   Name: Mark Jackson.  MRN: [id number] DOB: [birth year]   Reason for Encounter: General Adherence Call    Recent office visits:  12/11/2021 OV (PCP) [name], MD; no medication changes indicated.  Recent consult visits:  02/04/2022 OV (Pulmonology) Martyn Ehrich, NP; no medication changes indicated.  01/16/2022 OV (Neurology) Pieter Partridge, DO; no medication changes indicated.  Hospital visits:  12/26/2021 ED to Hospital Admission due to Cerebrovascular accident Admit date:     12/26/2021  Discharge date: 12/29/21  -No medication changes   Medications: Outpatient Encounter Medications as of 02/12/2022  Medication Sig Note   acetaminophen (TYLENOL) 325 MG tablet Take 2 tablets (650 mg total) by mouth every 4 (four) hours as needed for mild pain (or temp > 37.5 C (99.5 F)).    apixaban (ELIQUIS) 2.5 MG TABS tablet Take 1 tablet (2.5 mg total) by mouth 2 (two) times daily.    diclofenac Sodium (VOLTAREN) 1 % GEL Apply 2 g topically 4 (four) times daily.    diltiazem (CARDIZEM CD) 240 MG 24 hr capsule Take 1 capsule (240 mg total) by mouth daily.    ezetimibe (ZETIA) 10 MG tablet Take 1 tablet (10 mg total) by mouth daily.    ferrous sulfate 325 (65 FE) MG tablet Take 325 mg by mouth daily with breakfast. 01/01/2022: PTA med from prior admission carried over for CIR discharge reconciliation   furosemide (LASIX) 40 MG tablet Take 0.5 tablets (20 mg total) by mouth daily.    isosorbide mononitrate (IMDUR) 60 MG 24 hr tablet TAKE ONE TAB DAILY AFTER BREAKFAST. MAY TAKE AN ADDITONAL TAB IN EVENING X2DAYS IF USE NITROGLYCER    Multiple Vitamins-Minerals (CENTRUM SILVER ADULT 50+) TABS Take 1 tablet by mouth daily.    nitroGLYCERIN (NITROSTAT) 0.4 MG SL tablet ONE TABLET UNDER TONGUE WHEN NEEDED FOR CHEST PAIN. MAY REPEAT IN 5 MINUTES. (Patient taking differently: Place 0.4 mg under the tongue every 5 (five) minutes as needed for  chest pain.)    rosuvastatin (CRESTOR) 20 MG tablet TAKE (1) TABLET DAILY AT BEDTIME.    topiramate (TOPAMAX) 100 MG tablet Take 1.5 tablets (150 mg total) by mouth daily.    vitamin B-12 (CYANOCOBALAMIN) 1000 MCG tablet Take 1 tablet (1,000 mcg total) by mouth daily.    No facility-administered encounter medications on file as of 02/12/2022.   [place] for General Review Call   Chart Review:  Have there been any documented new, changed, or discontinued medications since last visit? No  Has there been any documented recent hospitalizations or ED visits since last visit with Clinical Pharmacist? Yes Brief Summary: Patient had a hospital admission on 12/26/2021 for Cerebrovascular accident, he did not have any medication changes.   Adherence Review:  Does the Clinical Pharmacist Assistant have access to adherence rates? Yes Adherence rates for STAR metric medications: Rosuvastatin 20 mg last filled 01/12/2022 90 DS Does the patient have >5 day gap between last estimated fill dates for any of the above medications or other medication gaps? No Reason for medication gaps.   Disease State Questions:  Able to connect with Patient? Yes Did patient have any problems with their health recently? No Note problems and Concerns: Have you had any admissions or emergency room visits or worsening of your condition(s) since last visit? Yes Details of ED visit, hospital visit and/or worsening condition(s): Patient had a hospital admission on 12/26/2021 for Cerebrovascular accident,  he did not have any medication changes. Have you had any visits with new specialists or providers since your last visit? Yes Have you had any new health care problem(s) since your last visit? No Have you run out of any of your medications since you last spoke with clinical pharmacist? No Are there any medications you are not taking as prescribed? No Are you having any issues or side effects with your  medications? No Do you have any other health concerns or questions you want to discuss with your Clinical Pharmacist before your next visit? No Note additional concerns and questions from Patient. Are there any health concerns that you feel we can do a better job addressing? No Are you having any problems with any of the following since the last visit:   None 12. Any falls since last visit? No 13. Any increased or uncontrolled pain since last visit? No  -Patient states he needs to make an appointment at Dr. Ansel Bong office to get his B12 injection. I sent a message to [facility] for someone to call him back to help schedule said appointment. -Patient states he has around the clock care. He claims they are a "pest" and he would like to get rid of them. He states he likes to have his alone time. -Patient states he has been feeling well since his last hospital visit.  Care Gaps: Medicare Annual Wellness: Completed 11/28/2021 Hemoglobin A1C: 5.8% on 12/27/2021 Colonoscopy: Completed 09/17/2021  Future Appointments  Date Time Provider [place]  02/18/2022  8:45 AM Frazier Butt, PT [facility]  02/18/2022  9:30 AM Kathrine Cords, OT [facility]  02/20/2022  9:30 AM Frazier Butt, PT [facility]  02/20/2022 10:15 AM Ellwood Dense, Holli Humbles, OT [facility]  02/23/2022  9:30 AM Frazier Butt, PT [facility]  02/23/2022 10:15 AM Ellwood Dense, Holli Humbles, OT [facility]  02/25/2022  9:30 AM Kathrine Cords, OT [facility]  02/25/2022 10:15 AM Janene Harvey D, PT [facility]  03/02/2022 10:15 AM Frazier Butt, PT [facility]  03/02/2022 11:00 AM Kerrie Buffalo, OT [facility]  03/04/2022  8:45 AM Frazier Butt, PT [facility]  03/04/2022  9:30 AM Ellwood Dense Holli Humbles, OT [facility]  03/09/2022  9:30 AM Toniann Fail, PT [facility]  03/09/2022 10:15 AM Ellwood Dense Holli Humbles, OT [facility]  03/11/2022 10:00 AM [facility] None  03/11/2022 10:30 AM Wyatt Portela, MD [facility] None  03/12/2022  9:30 AM Kerrie Buffalo, OT [facility]  03/12/2022 10:15 AM Toniann Fail, PT [facility]  03/16/2022  9:30 AM Frazier Butt, PT [facility]  03/16/2022 10:15 AM Ellwood Dense, Holli Humbles, OT [facility]  03/16/2022  8:00 PM Chesley Mires, MD [facility] MSD  03/18/2022  9:30 AM Kerrie Buffalo, OT [facility]  03/18/2022 10:15 AM Toniann Fail, PT [facility]  03/23/2022  9:30 AM Frazier Butt, PT [facility]  03/23/2022 10:15 AM Ellwood Dense, Holli Humbles, OT [facility]  03/24/2022  2:00 PM Martyn Ehrich, NP [facility] None  03/25/2022  9:30 AM Toniann Fail, PT [facility]  03/25/2022 10:15 AM Kerrie Buffalo, OT [facility]  04/28/2022  3:45 PM [facility] CCM PHARMACIST [facility] PEC  05/19/2022 10:45 AM Kirsteins, Luanna Salk, MD CPR[facility]PR[facility] CPR  07/21/2022  1:50 PM Pieter Partridge, DO [facility] None  12/11/2022  1:00 PM [facility] HEALTH COACH [facility] PEC   Star Rating Drugs: Rosuvastatin 20 mg  last filled 01/12/2022 90 DS  April D Calhoun, E. Lopez Pharmacist Assistant [phone]
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
## 2022-02-12 NOTE — Progress Notes (Signed)
Reviewed and agree with assessment/plan.   Chesley Mires, MD [facility] Pulmonary/Critical Care 02/12/2022, 8:44 AM Pager:  [phone]

## 2022-02-16 ENCOUNTER — Encounter: Payer: Self-pay | Admitting: Physical Medicine & Rehabilitation

## 2022-02-16 ENCOUNTER — Encounter: Payer: Medicare Other | Attending: Physical Medicine & Rehabilitation | Admitting: Physical Medicine & Rehabilitation

## 2022-02-16 VITALS — BP 158/74 | HR 65 | Ht 68.0 in | Wt 191.0 lb

## 2022-02-16 DIAGNOSIS — E785 Hyperlipidemia, unspecified: Secondary | ICD-10-CM

## 2022-02-16 DIAGNOSIS — F39 Unspecified mood [affective] disorder: Secondary | ICD-10-CM | POA: Diagnosis not present

## 2022-02-16 DIAGNOSIS — I6381 Other cerebral infarction due to occlusion or stenosis of small artery: Secondary | ICD-10-CM | POA: Diagnosis not present

## 2022-02-16 MED ORDER — FLUOXETINE HCL 10 MG PO CAPS: 10.0000 mg | ORAL_CAPSULE | Freq: Every day | ORAL | 2 refills | Status: DC
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
## 2022-02-17 ENCOUNTER — Encounter: Payer: Self-pay | Admitting: Oncology

## 2022-02-17 ENCOUNTER — Ambulatory Visit (INDEPENDENT_AMBULATORY_CARE_PROVIDER_SITE_OTHER): Payer: Medicare Other | Admitting: *Deleted

## 2022-02-17 DIAGNOSIS — E538 Deficiency of other specified B group vitamins: Secondary | ICD-10-CM | POA: Diagnosis not present

## 2022-02-17 MED ORDER — CYANOCOBALAMIN 1000 MCG/ML IJ SOLN
1000.0000 ug | Freq: Once | INTRAMUSCULAR | Status: AC
  Administered 2022-02-17: 1000 ug via INTRAMUSCULAR

## 2022-02-17 NOTE — Progress Notes (Signed)
Per orders of Dr. [name]  injection of monthly B 12 given in left deltoid per patient preference by Zacarias Pontes, CMA. Patient tolerated injection well. Patient reminded to schedule next monthly injection.

## 2022-02-18 ENCOUNTER — Ambulatory Visit: Payer: Medicare Other | Admitting: Occupational Therapy

## 2022-02-18 ENCOUNTER — Ambulatory Visit: Payer: Medicare Other | Admitting: Physical Therapy

## 2022-02-18 ENCOUNTER — Encounter: Payer: Self-pay | Admitting: Occupational Therapy

## 2022-02-18 ENCOUNTER — Encounter: Payer: Self-pay | Admitting: Physical Medicine & Rehabilitation

## 2022-02-18 ENCOUNTER — Encounter: Payer: Self-pay | Admitting: Physical Therapy

## 2022-02-18 DIAGNOSIS — R2681 Unsteadiness on feet: Secondary | ICD-10-CM

## 2022-02-18 DIAGNOSIS — R278 Other lack of coordination: Secondary | ICD-10-CM

## 2022-02-18 DIAGNOSIS — R2689 Other abnormalities of gait and mobility: Secondary | ICD-10-CM | POA: Diagnosis not present

## 2022-02-18 DIAGNOSIS — M6281 Muscle weakness (generalized): Secondary | ICD-10-CM

## 2022-02-18 DIAGNOSIS — I69351 Hemiplegia and hemiparesis following cerebral infarction affecting right dominant side: Secondary | ICD-10-CM
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
## 2022-02-18 NOTE — Patient Instructions (Signed)
Access Code: [email or web address] URL: [email or web address] Date: 02/18/2022 Prepared by: [facility]  Exercises - Seated Hamstring Stretch  - 1-2 x daily - 7 x weekly - 1 sets - 3 reps - 30 sec hold - Sit to Stand with Armchair  - 2 x daily - 5 x weekly - 1 sets - 5-10 reps - Standing Hip Abduction with Counter Support  - 1 x daily - 5 x weekly - 3 sets - 10 reps - Standing Hip Extension with Counter Support  - 1 x daily - 5 x weekly - 3 sets - 10 reps - Alternating Step Taps with Counter Support  - 1 x daily - 5 x weekly - 3 sets - 10 reps - Supine Bridge  - 1 x daily - 5 x weekly - 3 sets - 10 reps - 3 sec hold - Clamshell with Resistance  - 1 x daily - 5 x weekly - 3 sets - 10 reps - 3 sec hold

## 2022-02-19 ENCOUNTER — Encounter: Payer: Self-pay | Admitting: Oncology

## 2022-02-20 ENCOUNTER — Ambulatory Visit: Payer: Medicare Other | Admitting: Physical Therapy

## 2022-02-20 ENCOUNTER — Ambulatory Visit: Payer: Medicare Other | Admitting: Occupational Therapy

## 2022-02-20 ENCOUNTER — Encounter: Payer: Self-pay | Admitting: Physical Therapy

## 2022-02-20 DIAGNOSIS — M6281 Muscle weakness (generalized): Secondary | ICD-10-CM | POA: Diagnosis not present

## 2022-02-20 DIAGNOSIS — R2681 Unsteadiness on feet: Secondary | ICD-10-CM

## 2022-02-20 DIAGNOSIS — R2689 Other abnormalities of gait and mobility: Secondary | ICD-10-CM | POA: Diagnosis not present

## 2022-02-20 DIAGNOSIS — R278 Other lack of coordination: Secondary | ICD-10-CM

## 2022-02-20 DIAGNOSIS — I69351 Hemiplegia and hemiparesis following cerebral infarction affecting right dominant side: Secondary | ICD-10-CM | POA: Diagnosis not present

## 2022-02-20 NOTE — Patient Instructions (Signed)
Local Driver Evaluation Programs: ° °Comprehensive Evaluation: includes clinical and in vehicle behind the wheel testing by OCCUPATIONAL THERAPIST. Programs have varying levels of adaptive controls available for trial.  ° °[facility] °[address] °[place], [place]  [address] °[phone] or [phone] °http://www.driver-rehab.com °Evaluator:  Cyndee Crompton, OT/CDRS/CDI/SCDCM/Low Vision Certification ° °[facility] °[address] °[place], [place] [address] °[phone] °https://www.[email or web address].org/home/services/rehabilitation.aspx °Evaluators:  Shannon Sheek, OT and Jill Tucker, OT ° °[facility] [place] [facility] (ONLY SERVES VETERANS!!) °[facility] °[address] °[place],   [address] °[phone] °http://www.[place].va.gov/services/Physical_Medicine_Rehabilitation_Services.asp °Evaluators:  Eric Andrews, KT; Heidi Harris, KT;  Gary Whitaker, KT (KT=kiniesotherapist) ° ° °Clinical evaluations only:  Includes clinical testing, refers to other programs or local certified driving instructor for behind the wheel testing. ° °[facility] (outpatient Rehab) °[facility] °[address] °[place],  [address] °[phone] for scheduling °[email or web address]://[email or web address]-Therapy.[email or web address] °Evaluators:  Kelly Lambeth, OT; Kate Phillips, OT ° °Other area clinical evaluators available upon request including [facility], [facility] and [facility]. ° ° °    Resource List °What is a Driver Evaluation: °Your Road Ahead - A Guide to Comprehensive Driving Evaluations °http://www.thehartford.com/resources/mature-market-excellence/publications-on-aging ° °Association for [employer] Rehabilitation Services - Disability and Driving Fact Sheets °http://www.aded.net/?page=510 ° °Driving after a Brain  Injury: °[employer] °http://www.biausa.org/tbims-abstracts/if-there-is-an-effective-way-to-determine-if-someone-is-ready-to-drive-after-tbi?A=SearchResult&SearchID=9495675&ObjectID=2758842&ObjectType=35 ° °Driving with Adaptive Equipment: °[employer] [facility] Process °http://www.driver-rehab.com/adaptive-equipment ° °[employer] Mobility [employer] °http://www.nmeda.com/ ° ° ° ° ° ° °  °

## 2022-02-20 NOTE — Patient Instructions (Signed)
Access Code: [email or web address] URL: [email or web address] Date: 02/20/2022 Prepared by: [facility]  Exercises - Seated Hamstring Stretch  - 1-2 x daily - 7 x weekly - 1 sets - 3 reps - 30 sec hold - Sit to Stand with Armchair  - 2 x daily - 5 x weekly - 1 sets - 5-10 reps - Standing Hip Abduction with Counter Support  - 1 x daily - 5 x weekly - 3 sets - 10 reps - Standing Hip Extension with Counter Support  - 1 x daily - 5 x weekly - 3 sets - 10 reps - Alternating Step Taps with Counter Support  - 1 x daily - 5 x weekly - 3 sets - 10 reps - Supine Bridge  - 1 x daily - 5 x weekly - 3 sets - 10 reps - 3 sec hold - Clamshell with Resistance  - 1 x daily - 5 x weekly - 3 sets - 10 reps - 3 sec hold - Standing Romberg to 1/2 Tandem Stance  - 1 x daily - 5 x weekly - 1 sets - 3 reps - 15-30 sec hold - Single Leg Stance with Support  - 1 x daily - 5 x weekly - 1 sets - 3 reps - 15 sec hold
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
## 2022-02-23 ENCOUNTER — Encounter: Payer: Self-pay | Admitting: Physical Therapy

## 2022-02-23 ENCOUNTER — Ambulatory Visit: Payer: Medicare Other | Admitting: Occupational Therapy

## 2022-02-23 ENCOUNTER — Ambulatory Visit: Payer: Medicare Other | Admitting: Physical Therapy

## 2022-02-23 DIAGNOSIS — M6281 Muscle weakness (generalized): Secondary | ICD-10-CM

## 2022-02-23 DIAGNOSIS — R2689 Other abnormalities of gait and mobility: Secondary | ICD-10-CM | POA: Diagnosis not present

## 2022-02-23 DIAGNOSIS — I69351 Hemiplegia and hemiparesis following cerebral infarction affecting right dominant side: Secondary | ICD-10-CM

## 2022-02-23 DIAGNOSIS — R2681 Unsteadiness on feet: Secondary | ICD-10-CM

## 2022-02-23 DIAGNOSIS — R278 Other lack of coordination: Secondary | ICD-10-CM | POA: Diagnosis not present
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
## 2022-02-23 NOTE — Patient Instructions (Signed)
To Get Rid of Overnight Care:  - 2-3 times prepare & clean up morning meals  - 2-3 times prepare & clean up evening meals  - 2-3 times pick out clothing   If family is not assisting overnight (and everyone feels comfortable) family can stop spending the night. They can come just for time periods where assistance is needed.  Once you are not needing assistance with morning and evening routines (2-3 +) times, family can fade supervision.
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
## 2022-02-25 ENCOUNTER — Ambulatory Visit: Payer: Medicare Other | Admitting: Physical Therapy

## 2022-02-25 ENCOUNTER — Encounter: Payer: Self-pay | Admitting: Physical Therapy

## 2022-02-25 ENCOUNTER — Encounter: Payer: Medicare Other | Admitting: Occupational Therapy

## 2022-02-25 DIAGNOSIS — R2681 Unsteadiness on feet: Secondary | ICD-10-CM | POA: Diagnosis not present

## 2022-02-25 DIAGNOSIS — M6281 Muscle weakness (generalized): Secondary | ICD-10-CM | POA: Diagnosis not present

## 2022-02-25 DIAGNOSIS — I69351 Hemiplegia and hemiparesis following cerebral infarction affecting right dominant side: Secondary | ICD-10-CM | POA: Diagnosis not present

## 2022-02-25 DIAGNOSIS — R2689 Other abnormalities of gait and mobility: Secondary | ICD-10-CM

## 2022-02-25 DIAGNOSIS — R278 Other lack of coordination: Secondary | ICD-10-CM | POA: Diagnosis not present

## 2022-03-02 ENCOUNTER — Ambulatory Visit: Payer: Medicare Other | Admitting: Physical Therapy

## 2022-03-02 ENCOUNTER — Encounter: Payer: Medicare Other | Admitting: Occupational Therapy

## 2022-03-02 ENCOUNTER — Encounter: Payer: Self-pay | Admitting: Physical Therapy

## 2022-03-02 DIAGNOSIS — I69351 Hemiplegia and hemiparesis following cerebral infarction affecting right dominant side: Secondary | ICD-10-CM | POA: Diagnosis not present

## 2022-03-02 DIAGNOSIS — R2681 Unsteadiness on feet: Secondary | ICD-10-CM | POA: Diagnosis not present

## 2022-03-02 DIAGNOSIS — R278 Other lack of coordination: Secondary | ICD-10-CM | POA: Diagnosis not present

## 2022-03-02 DIAGNOSIS — R2689 Other abnormalities of gait and mobility: Secondary | ICD-10-CM | POA: Diagnosis not present

## 2022-03-02 DIAGNOSIS — M6281 Muscle weakness (generalized): Secondary | ICD-10-CM
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
## 2022-03-04 ENCOUNTER — Encounter: Payer: Self-pay | Admitting: Physical Therapy

## 2022-03-04 ENCOUNTER — Encounter: Payer: Medicare Other | Admitting: Occupational Therapy

## 2022-03-04 ENCOUNTER — Ambulatory Visit: Payer: Medicare Other | Admitting: Physical Therapy

## 2022-03-04 DIAGNOSIS — M6281 Muscle weakness (generalized): Secondary | ICD-10-CM

## 2022-03-04 DIAGNOSIS — R2689 Other abnormalities of gait and mobility: Secondary | ICD-10-CM | POA: Diagnosis not present

## 2022-03-04 DIAGNOSIS — R278 Other lack of coordination: Secondary | ICD-10-CM | POA: Diagnosis not present

## 2022-03-04 DIAGNOSIS — I69351 Hemiplegia and hemiparesis following cerebral infarction affecting right dominant side: Secondary | ICD-10-CM | POA: Diagnosis not present

## 2022-03-04 DIAGNOSIS — R2681 Unsteadiness on feet: Secondary | ICD-10-CM
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
## 2022-03-05 ENCOUNTER — Encounter: Payer: Self-pay | Admitting: Family Medicine

## 2022-03-05 ENCOUNTER — Encounter: Payer: Self-pay | Admitting: Oncology

## 2022-03-05 ENCOUNTER — Ambulatory Visit (INDEPENDENT_AMBULATORY_CARE_PROVIDER_SITE_OTHER): Payer: Medicare Other | Admitting: Family Medicine

## 2022-03-05 VITALS — BP 144/74 | HR 77 | Temp 98.1°F | Ht 68.0 in | Wt 189.4 lb

## 2022-03-05 DIAGNOSIS — I25119 Atherosclerotic heart disease of native coronary artery with unspecified angina pectoris: Secondary | ICD-10-CM

## 2022-03-05 DIAGNOSIS — F4321 Adjustment disorder with depressed mood: Secondary | ICD-10-CM

## 2022-03-05 DIAGNOSIS — E538 Deficiency of other specified B group vitamins: Secondary | ICD-10-CM | POA: Diagnosis not present

## 2022-03-05 DIAGNOSIS — I6521 Occlusion and stenosis of right carotid artery: Secondary | ICD-10-CM

## 2022-03-05 DIAGNOSIS — E785 Hyperlipidemia, unspecified: Secondary | ICD-10-CM

## 2022-03-05 DIAGNOSIS — G8191 Hemiplegia, unspecified affecting right dominant side: Secondary | ICD-10-CM

## 2022-03-05 DIAGNOSIS — I10 Essential (primary) hypertension: Secondary | ICD-10-CM

## 2022-03-05 DIAGNOSIS — I779 Disorder of arteries and arterioles, unspecified: Secondary | ICD-10-CM | POA: Diagnosis not present

## 2022-03-05 LAB — CBC WITH DIFFERENTIAL/PLATELET
Basophils Absolute: 0.1 10*3/uL (ref 0.0–0.1)
Basophils Relative: 0.5 % (ref 0.0–3.0)
Eosinophils Absolute: 0.3 10*3/uL (ref 0.0–0.7)
Eosinophils Relative: 3.4 % (ref 0.0–5.0)
HCT: 33.5 % — ABNORMAL LOW (ref 39.0–52.0)
Hemoglobin: 11.2 g/dL — ABNORMAL LOW (ref 13.0–17.0)
Lymphocytes Relative: 14.6 % (ref 12.0–46.0)
Lymphs Abs: 1.5 10*3/uL (ref 0.7–4.0)
MCHC: 33.5 g/dL (ref 30.0–36.0)
MCV: 101.3 fl — ABNORMAL HIGH (ref 78.0–100.0)
Monocytes Absolute: 1.1 10*3/uL — ABNORMAL HIGH (ref 0.1–1.0)
Monocytes Relative: 11 % (ref 3.0–12.0)
Neutro Abs: 7.1 10*3/uL (ref 1.4–7.7)
Neutrophils Relative %: 70.5 % (ref 43.0–77.0)
Platelets: 134 10*3/uL — ABNORMAL LOW (ref 150.0–400.0)
RBC: 3.31 Mil/uL — ABNORMAL LOW (ref 4.22–5.81)
RDW: 13.6 % (ref 11.5–15.5)
WBC: 10 10*3/uL (ref 4.0–10.5)

## 2022-03-05 LAB — COMPREHENSIVE METABOLIC PANEL
ALT: 10 U/L (ref 0–53)
AST: 17 U/L (ref 0–37)
Albumin: 3.6 g/dL (ref 3.5–5.2)
Alkaline Phosphatase: 57 U/L (ref 39–117)
BUN: 62 mg/dL — ABNORMAL HIGH (ref 6–23)
CO2: 23 mEq/L (ref 19–32)
Calcium: 8.5 mg/dL (ref 8.4–10.5)
Chloride: 111 mEq/L (ref 96–112)
Creatinine, Ser: 3.13 mg/dL — ABNORMAL HIGH (ref 0.40–1.50)
GFR: 16.96 mL/min — ABNORMAL LOW (ref >60.00)
Glucose, Bld: 101 mg/dL — ABNORMAL HIGH (ref 70–99)
Potassium: 5.1 mEq/L (ref 3.5–5.1)
Sodium: 142 mEq/L (ref 135–145)
Total Bilirubin: 0.4 mg/dL (ref 0.2–1.2)
Total Protein: 6.4 g/dL (ref 6.0–8.3)

## 2022-03-05 LAB — TSH: TSH: 3.03 u[IU]/mL (ref 0.35–5.50)

## 2022-03-05 LAB — VITAMIN B12: Vitamin B-12: 533 pg/mL (ref 211–911)

## 2022-03-05 LAB — LDL CHOLESTEROL, DIRECT: Direct LDL: 89 mg/dL

## 2022-03-05 NOTE — Assessment & Plan Note (Signed)
Recently started on fluoxetine by physical medicine and rehabilitation-no suicidal ideation-we opted to continue current medication and follow-up in 2 months

## 2022-03-05 NOTE — Progress Notes (Signed)
Phone [phone] In person visit   Subjective:   Mark Jackson. is a 88 y.o. year old very pleasant male patient who presents for/with See problem oriented charting Chief Complaint  Patient presents with   Follow-up   Hypertension   Past Medical History-  Patient Active Problem List   Diagnosis Date Noted   Right hemiparesis ([place]) 03/05/2022    Priority: High   Chronic diastolic heart failure ([place]) 12/12/2019    Priority: High   S/P TAVR (transcatheter aortic valve replacement) 12/12/2019    Priority: High   CAD S/P DES PCI to proximal LAD 03/22/2015    Priority: High   Paroxysmal atrial fibrillation ([place]); CHA2DSVasc - 4; Now on Eliquis 08/20/2014    Priority: High    Class: Diagnosis of   Chronic kidney disease (CKD), active medical management without dialysis, stage 4 (severe) ([place]) 08/20/2014    Priority: High   Personal history of prostate cancer 10/22/2008    Priority: High   Hyperglycemia 09/27/2017    Priority: Medium    B12 deficiency 01/06/2017    Priority: Medium    BPH associated with nocturia 06/15/2016    Priority: Medium    Hereditary and idiopathic peripheral neuropathy 01/12/2014    Priority: Medium    H/O syncope 09/03/2013    Priority: Medium    Right-sided carotid artery disease; followed by Dr. Trula Slade 03/02/2013    Priority: Medium    Hyperlipidemia with target LDL less than 70 03/02/2013    Priority: Medium    Migraine without aura 10/26/2012    Priority: Medium    Anemia 10/23/2008    Priority: Medium    Essential hypertension 10/22/2008    Priority: Medium    Excessive daytime sleepiness 10/22/2008    Priority: Medium    Thrombocytopenia, unspecified ([place]) 12/11/2021    Priority: Low   Syncope and collapse 11/27/2019    Priority: Low   Fatigue 11/08/2017    Priority: Low   Chronic diarrhea 01/06/2017    Priority: Low   Perianal dermatitis 06/19/2015    Priority: Low   Rectal bleeding 04/25/2015    Priority: Low   Long  term current use of anticoagulant therapy 08/27/2014    Priority: Low   GLAUCOMA 10/23/2008    Priority: Low   Arthropathy 10/22/2008    Priority: Low   History of colonic polyps 10/22/2008    Priority: Low   Situational depression 03/05/2022   Loud snoring 02/04/2022   Hematuria 12/11/2020   Bilateral lower extremity edema 04/15/2017    Medications- reviewed and updated Current Outpatient Medications  Medication Sig Dispense Refill   acetaminophen (TYLENOL) 325 MG tablet Take 2 tablets (650 mg total) by mouth every 4 (four) hours as needed for mild pain (or temp > 37.5 C (99.5 F)).     apixaban (ELIQUIS) 2.5 MG TABS tablet Take 1 tablet (2.5 mg total) by mouth 2 (two) times daily. 180 tablet 1   diclofenac Sodium (VOLTAREN) 1 % GEL Apply 2 g topically 4 (four) times daily. 2 g 0   diltiazem (CARDIZEM CD) 240 MG 24 hr capsule Take 1 capsule (240 mg total) by mouth daily. 30 capsule 0   ezetimibe (ZETIA) 10 MG tablet Take 1 tablet (10 mg total) by mouth daily. 30 tablet 11   ferrous sulfate 325 (65 FE) MG tablet Take 325 mg by mouth daily with breakfast.     FLUoxetine (PROZAC) 10 MG capsule Take 1 capsule (10 mg total) by mouth daily.  30 capsule 2   furosemide (LASIX) 40 MG tablet Take 0.5 tablets (20 mg total) by mouth daily. 30 tablet 0   isosorbide mononitrate (IMDUR) 60 MG 24 hr tablet TAKE ONE TAB DAILY AFTER BREAKFAST. MAY TAKE AN ADDITONAL TAB IN EVENING X2DAYS IF USE NITROGLYCER 120 tablet 3   Multiple Vitamins-Minerals (CENTRUM SILVER ADULT 50+) TABS Take 1 tablet by mouth daily.     nitroGLYCERIN (NITROSTAT) 0.4 MG SL tablet ONE TABLET UNDER TONGUE WHEN NEEDED FOR CHEST PAIN. MAY REPEAT IN 5 MINUTES. (Patient taking differently: Place 0.4 mg under the tongue every 5 (five) minutes as needed for chest pain.) 25 tablet 0   rosuvastatin (CRESTOR) 20 MG tablet TAKE (1) TABLET DAILY AT BEDTIME. 90 tablet 1   topiramate (TOPAMAX) 100 MG tablet Take 1.5 tablets (150 mg total) by mouth  daily. 135 tablet 3   No current facility-administered medications for this visit.     Objective:  BP (!) 144/74   Pulse 77   Temp 98.1 F (36.7 C)   Ht '5\' 8"'$  (1.727 m)   Wt 189 lb 6.4 oz (85.9 kg)   SpO2 98%   BMI 28.80 kg/m  Gen: NAD, resting comfortably CV: RRR stable murmur Lungs: CTAB no crackles, wheeze, rhonchi Ext: 1+ edema- trouble with compression stockings Skin: warm, dry Neuro: 4/5 strength RUE and RLE, 5/5 on LUE and LLE    Assessment and Plan   #CAD status post DES PCI to proximal LAD- sees Dr. Ellyn Hack #History of CVA-Dec 26 2021 hospitalization with right hemiparesis (left thalamic infarction and small left cerebellar infarct thalamic CVA most likely SVD although 2 locations suggests cardioembolic). He also had rehab hospitalization after stroke from 5/22 to 01/13/22. He is doing outpatient rehab now- going to clinic. Also feels weak all over. More fatigue post stroke- sleeping 11-12 hours a day #hyperlipidemia #Right internal carotid artery stenosis-followed by vascular S: Medication:Aspirin was not added as patient already on Eliquis during May 2023 stroke hospitalization Crestor 20 mg, Zetia 10 mg added after May 2023 stroke  A/P: Patient with stroke in May-continues on the road to recovery-residual right hemiparesis.  Continue Eliquis as appears to be embolic-did have evidence of prior strokes including in the cerebellum-could contribute to balance issues - For hyperlipidemia LDL above goal today at 89-patient and family did not seem certain about him taking Zetia-my team will reach out about this - Right internal carotid artery stenosis-continue follow-up with vascular 60 to 79% stenosis on the right-would not explain left-sided stroke still considering asymptomatic - CAD without chest pain but activity is more limited after stroke-continue monitoring continue current medications   #Depression after stroke S:  fluoxetine caused constipation at first but  restarted and doing better- just recently started so we will monitor at follow up. No toughts of self harm. Prescribed by PM&R    03/05/2022    1:15 PM 02/16/2022    1:22 PM 11/28/2021    1:04 PM  Depression screen PHQ 2/9  Decreased Interest 0 0 0  Down, Depressed, Hopeless 0 0 1  PHQ - 2 Score 0 0 1  Altered sleeping 1 3   Tired, decreased energy 1 3   Change in appetite 1 2   Feeling bad or failure about yourself  0 0   Trouble concentrating 1 0   Moving slowly or fidgety/restless 0 0   Suicidal thoughts  0   PHQ-9 Score 4 8   Difficult doing work/chores Somewhat difficult  A/P: Recently started on fluoxetine by physical medicine and rehabilitation-no suicidal ideation-we opted to continue current medication and follow-up in 2 months   # Atrial fibrillation S: Rate controlled with diltiazem '240mg'$  XR  Anticoagulated with eliquis 2.5 mg BID A/P: Appropriately anticoagulated and rate controlled  # B12 deficiency S: Current treatment/medication (oral vs. IM): b12 monthly  Lab Results  Component Value Date   OACZYSAY30 160 03/05/2022  A/P: B12 level today looks excellent-continue monthly injections  #Migraine without aura- topamax '150mg'$  still helpful  #hypertension S: medication:  imdur 60 mg, diltiazem '240mg'$  Home readings #s: Typically in the 140s BP Readings from Last 3 Encounters:  03/05/22 (!) 144/74  02/16/22 (!) 158/74  02/04/22 (!) 162/72  A/P: Poor control in light of stroke-with that being said very high risk for falls and also on Eliquis-very concerned about tightening blood pressure is already has orthostatic issues even just bending over with his shoes-we made a joint decision not to adjust medication at this time but to monitor at home-if progresses further we may adjust further.  We will also recheck at 28-monthfollow-up  #Venous insufficiency-recommended sock caddy to see if this will help him get his compression stockings on-reviewed a few  options  Recommended follow up: Return in about 2 months (around 05/06/2022) for followup or sooner if needed.Schedule b4 you leave. Future Appointments  Date Time Provider [place] 03/09/2022  9:30 AM HToniann Fail PT [facility]  03/09/2022 10:15 AM [place], SHolli Humbles OT [facility]  03/11/2022 10:00 AM [facility] None  03/11/2022 10:30 AM SWyatt Portela MD [facility] None  03/12/2022  9:30 AM HKerrie Buffalo OT [facility]  03/12/2022 10:15 AM HToniann Fail PT [facility]  03/16/2022  9:30 AM MFrazier Butt PT [facility]  03/16/2022 10:15 AM [place] Dense SHolli Humbles OT [facility]  03/16/2022  8:00 PM SChesley Mires MD [facility]  03/18/2022  9:30 AM HKerrie [place] OT [facility]  03/18/2022 10:15 AM HToniann Fail PT [facility]  03/23/2022  9:30 AM MFrazier Butt PT [facility]  03/23/2022 10:15 AM [place] DenseSHolli Humbles OT [facility]  03/24/2022  9:45 AM [facility] NURSE [facility]EC  03/24/2022  2:00 PM WMartyn Ehrich NP [facility] None  03/25/2022  9:30 AM HToniann Fail PT [facility]  03/25/2022 10:15 AM HKerrie Buffalo OT [facility]  04/28/2022  3:45 PM [facility] CCM PHARMACIST [facility]  05/07/2022 10:20 AM HMarin Olp MD [facility]  05/19/2022 10:45 AM K[place], ALuanna Salk MD [facility] CPR  07/21/2022  1:50 PM JPieter Partridge DO [facility] None  12/11/2022  1:00 PM [facility] HEALTH COACH [facility]   Lab/Order associations:   ICD-10-CM   1. CAD S/P DES PCI to proximal LAD  I25.119     2. Hyperlipidemia with target LDL less than 70  E78.5 Comprehensive metabolic panel    CBC with Differential/Platelet    LDL cholesterol, direct    TSH    3. Right-sided carotid artery disease, unspecified type ([place]  I77.9     4. Essential hypertension  I10     5. B12 deficiency  E53.8 Vitamin B12    6. Right hemiparesis ([place]  G81.91     7. Situational depression  F43.21       Time Spent: 46 minutes of total time (1:30  PM-2:16 PM) was spent on the date of the encounter performing the following actions: chart review prior to  seeing the patient, obtaining history, performing a medically necessary exam, reviewing hospital course together and counseling on the treatment plan-particularly focused on joint decision-making about hypertension, placing orders, and documenting in our EHR.    Return precautions advised.  [name], MD

## 2022-03-05 NOTE — Patient Instructions (Addendum)
Please stop by lab before you go If you have mychart- we will send your results within 3 business days of [place] receiving them.  If you do not have mychart- we will call you about results within 5 business days of [place] receiving them.  *please also note that you will see labs on mychart as soon as they post. I will later go in and write notes on them- will say "notes from Dr. [name]"   No changes today- blood pressure slightly high but I hesitate with your kidneys and fal history to increase dose of medicine. Does slgihtly increase stroke risk but we think this is the best decision right now. Monitor at least 3x a week and if regularly above 150 lets change our plan   Keep working hard with PT/OT  Recommended follow up: Return in about 2 months (around 05/06/2022) for followup or sooner if needed.Schedule b4 you leave.

## 2022-03-09 ENCOUNTER — Ambulatory Visit: Payer: Medicare Other | Admitting: Occupational Therapy

## 2022-03-09 ENCOUNTER — Ambulatory Visit: Payer: Medicare Other

## 2022-03-09 DIAGNOSIS — R278 Other lack of coordination: Secondary | ICD-10-CM | POA: Diagnosis not present

## 2022-03-09 DIAGNOSIS — M6281 Muscle weakness (generalized): Secondary | ICD-10-CM | POA: Diagnosis not present

## 2022-03-09 DIAGNOSIS — R2689 Other abnormalities of gait and mobility: Secondary | ICD-10-CM

## 2022-03-09 DIAGNOSIS — I69351 Hemiplegia and hemiparesis following cerebral infarction affecting right dominant side: Secondary | ICD-10-CM | POA: Diagnosis not present

## 2022-03-09 DIAGNOSIS — R2681 Unsteadiness on feet: Secondary | ICD-10-CM | POA: Diagnosis not present
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
## 2022-03-10 ENCOUNTER — Other Ambulatory Visit: Payer: Self-pay

## 2022-03-10 DIAGNOSIS — E785 Hyperlipidemia, unspecified: Secondary | ICD-10-CM

## 2022-03-10 MED ORDER — ROSUVASTATIN CALCIUM 20 MG PO TABS: ORAL_TABLET | ORAL | 1 refills | Status: DC

## 2022-03-11 ENCOUNTER — Encounter: Payer: Self-pay | Admitting: Oncology

## 2022-03-11 ENCOUNTER — Inpatient Hospital Stay: Payer: Medicare Other | Attending: Oncology | Admitting: Oncology

## 2022-03-11 ENCOUNTER — Inpatient Hospital Stay: Payer: Medicare Other

## 2022-03-11 ENCOUNTER — Other Ambulatory Visit: Payer: Self-pay

## 2022-03-11 VITALS — BP 156/68 | HR 81 | Temp 97.7°F | Resp 15 | Wt 190.4 lb

## 2022-03-11 DIAGNOSIS — D539 Nutritional anemia, unspecified: Secondary | ICD-10-CM | POA: Insufficient documentation

## 2022-03-11 DIAGNOSIS — Z8673 Personal history of transient ischemic attack (TIA), and cerebral infarction without residual deficits: Secondary | ICD-10-CM | POA: Insufficient documentation

## 2022-03-11 DIAGNOSIS — D631 Anemia in chronic kidney disease: Secondary | ICD-10-CM | POA: Insufficient documentation

## 2022-03-11 DIAGNOSIS — D649 Anemia, unspecified: Secondary | ICD-10-CM

## 2022-03-11 DIAGNOSIS — N189 Chronic kidney disease, unspecified: Secondary | ICD-10-CM | POA: Diagnosis not present

## 2022-03-11 DIAGNOSIS — D509 Iron deficiency anemia, unspecified: Secondary | ICD-10-CM | POA: Diagnosis not present

## 2022-03-11 DIAGNOSIS — D696 Thrombocytopenia, unspecified: Secondary | ICD-10-CM | POA: Insufficient documentation

## 2022-03-11 NOTE — Progress Notes (Signed)
Hematology and Oncology Follow Up Visit  Mark Jackson [id number] 1930-01-04 88 y.o. 03/11/2022 10:07 AM Mark Jackson Mark Jackson, MDHunter, Mark Mars, MD   Principle Diagnosis: 88 year old man with microcytic anemia noted in April 2022.  He was found to have mild findings with no clear-cut hematological disorder.     Current therapy: Active surveillance.  Interim History: Mark Jackson returns today for repeat evaluation.  Since the last visit, he was hospitalized for left thalamic stroke in May 2023 required hospitalization and acute rehab.  Since his discharge, he is moving with the help of a walker without any major falls or syncope.  He denies any bleeding complications.  He denies hematochezia, melena or hemoptysis.     Medications: Reviewed without changes. Current Outpatient Medications  Medication Sig Dispense Refill   acetaminophen (TYLENOL) 325 MG tablet Take 2 tablets (650 mg total) by mouth every 4 (four) hours as needed for mild pain (or temp > 37.5 C (99.5 F)).     apixaban (ELIQUIS) 2.5 MG TABS tablet Take 1 tablet (2.5 mg total) by mouth 2 (two) times daily. 180 tablet 1   diclofenac Sodium (VOLTAREN) 1 % GEL Apply 2 g topically 4 (four) times daily. 2 g 0   diltiazem (CARDIZEM CD) 240 MG 24 hr capsule Take 1 capsule (240 mg total) by mouth daily. 30 capsule 0   ferrous sulfate 325 (65 FE) MG tablet Take 325 mg by mouth daily with breakfast.     FLUoxetine (PROZAC) 10 MG capsule Take 1 capsule (10 mg total) by mouth daily. 30 capsule 2   furosemide (LASIX) 40 MG tablet Take 0.5 tablets (20 mg total) by mouth daily. 30 tablet 0   isosorbide mononitrate (IMDUR) 60 MG 24 hr tablet TAKE ONE TAB DAILY AFTER BREAKFAST. MAY TAKE AN ADDITONAL TAB IN EVENING X2DAYS IF USE NITROGLYCER 120 tablet 3   Multiple Vitamins-Minerals (CENTRUM SILVER ADULT 50+) TABS Take 1 tablet by mouth daily.     nitroGLYCERIN (NITROSTAT) 0.4 MG SL tablet ONE TABLET UNDER TONGUE WHEN NEEDED FOR CHEST PAIN.  MAY REPEAT IN 5 MINUTES. (Patient taking differently: Place 0.4 mg under the tongue every 5 (five) minutes as needed for chest pain.) 25 tablet 0   rosuvastatin (CRESTOR) 20 MG tablet TAKE (1) TABLET DAILY AT BEDTIME. 90 tablet 1   topiramate (TOPAMAX) 100 MG tablet Take 1.5 tablets (150 mg total) by mouth daily. 135 tablet 3   No current facility-administered medications for this visit.     Allergies: No Known Allergies    Physical Exam:  Blood pressure (!) 156/68, pulse 81, temperature 97.7 F (36.5 C), temperature source Temporal, resp. rate 15, weight 190 lb 6.4 oz (86.4 kg), SpO2 99 %.   ECOG: 1   General appearance: Comfortable appearing without any discomfort Head: Normocephalic without any trauma Oropharynx: Mucous membranes are moist and pink without any thrush or ulcers. Eyes: Pupils are equal and round reactive to light. Lymph nodes: No cervical, supraclavicular, inguinal or axillary lymphadenopathy.   Heart:regular rate and rhythm.  S1 and S2 without leg edema. Lung: Clear without any rhonchi or wheezes.  No dullness to percussion. Abdomin: Soft, nontender, nondistended with good bowel sounds.  No hepatosplenomegaly. Musculoskeletal: No joint deformity or effusion.  Full range of motion noted. Neurological: No deficits noted on motor, sensory and deep tendon reflex exam. Skin: No petechial rash or dryness.  Appeared moist.     Lab Results: Lab Results  Component Value Date   WBC 10.0  03/05/2022   HGB 11.2 (L) 03/05/2022   HCT 33.5 (L) 03/05/2022   MCV 101.3 (H) 03/05/2022   PLT 134.0 (L) 03/05/2022   PSA <0.02 01/15/2017     Chemistry      Component Value Date/Time   NA 142 03/05/2022 1421   NA 142 11/15/2020 0000   K 5.1 03/05/2022 1421   CL 111 03/05/2022 1421   CO2 23 03/05/2022 1421   BUN 62 (H) 03/05/2022 1421   BUN 49 (A) 11/15/2020 0000   CREATININE 3.13 (H) 03/05/2022 1421   CREATININE 2.97 (H) 07/19/2020 1401   GLU 96 11/15/2020 0000       Component Value Date/Time   CALCIUM 8.5 03/05/2022 1421   ALKPHOS 57 03/05/2022 1421   AST 17 03/05/2022 1421   ALT 10 03/05/2022 1421   BILITOT 0.4 03/05/2022 1421         Impression and Plan:   88 year old with:  1.  Mild macrocytic anemia diagnosed in April 2022.  Laboratory data obtained on March 05, 2022 which showed mild anemia and microcytosis but no other clear-cut abnormalities.  His anemia is multifactorial in nature related to chronic renal disease and possible early myelodysplasia.  His hemoglobin is adequate and does not require any growth factor support at this time.  I recommended continued monitoring and repeat laboratory testing in the next 6 to 8 months.  2.  Thrombocytopenia: Continues to be mild and fluctuating without any need for intervention.   3.  Follow-up: 6 months for a follow-up.   30  minutes were spent on this visit.  The time was dedicated to reviewing laboratory data, disease status update and future plan of care discussion.   Mark Button, MD 8/2/202310:07 AM

## 2022-03-12 ENCOUNTER — Ambulatory Visit: Payer: Medicare Other | Attending: Physician Assistant

## 2022-03-12 ENCOUNTER — Encounter: Payer: Medicare Other | Admitting: Occupational Therapy

## 2022-03-12 DIAGNOSIS — M6281 Muscle weakness (generalized): Secondary | ICD-10-CM | POA: Insufficient documentation

## 2022-03-12 DIAGNOSIS — R2681 Unsteadiness on feet: Secondary | ICD-10-CM | POA: Diagnosis not present

## 2022-03-12 DIAGNOSIS — R2689 Other abnormalities of gait and mobility: Secondary | ICD-10-CM | POA: Insufficient documentation

## 2022-03-12 DIAGNOSIS — I69351 Hemiplegia and hemiparesis following cerebral infarction affecting right dominant side: Secondary | ICD-10-CM | POA: Diagnosis not present

## 2022-03-12 DIAGNOSIS — R278 Other lack of coordination: Secondary | ICD-10-CM | POA: Insufficient documentation
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
## 2022-03-13 ENCOUNTER — Other Ambulatory Visit: Payer: Self-pay

## 2022-03-16 ENCOUNTER — Encounter: Payer: Medicare Other | Admitting: Occupational Therapy

## 2022-03-16 ENCOUNTER — Ambulatory Visit: Payer: Medicare Other | Admitting: Physical Therapy

## 2022-03-16 ENCOUNTER — Encounter: Payer: Self-pay | Admitting: Physical Therapy

## 2022-03-16 ENCOUNTER — Encounter (HOSPITAL_BASED_OUTPATIENT_CLINIC_OR_DEPARTMENT_OTHER): Payer: Medicare Other | Admitting: Pulmonary Disease

## 2022-03-16 DIAGNOSIS — R2681 Unsteadiness on feet: Secondary | ICD-10-CM

## 2022-03-16 DIAGNOSIS — M6281 Muscle weakness (generalized): Secondary | ICD-10-CM | POA: Diagnosis not present

## 2022-03-16 DIAGNOSIS — R2689 Other abnormalities of gait and mobility: Secondary | ICD-10-CM | POA: Diagnosis not present

## 2022-03-16 DIAGNOSIS — R278 Other lack of coordination: Secondary | ICD-10-CM | POA: Diagnosis not present

## 2022-03-16 DIAGNOSIS — I69351 Hemiplegia and hemiparesis following cerebral infarction affecting right dominant side: Secondary | ICD-10-CM | POA: Diagnosis not present
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
## 2022-03-18 ENCOUNTER — Encounter: Payer: Medicare Other | Admitting: Occupational Therapy

## 2022-03-18 ENCOUNTER — Ambulatory Visit: Payer: Medicare Other

## 2022-03-18 DIAGNOSIS — M6281 Muscle weakness (generalized): Secondary | ICD-10-CM | POA: Diagnosis not present

## 2022-03-18 DIAGNOSIS — R2681 Unsteadiness on feet: Secondary | ICD-10-CM

## 2022-03-18 DIAGNOSIS — R278 Other lack of coordination: Secondary | ICD-10-CM | POA: Diagnosis not present

## 2022-03-18 DIAGNOSIS — R2689 Other abnormalities of gait and mobility: Secondary | ICD-10-CM

## 2022-03-18 DIAGNOSIS — I69351 Hemiplegia and hemiparesis following cerebral infarction affecting right dominant side: Secondary | ICD-10-CM | POA: Diagnosis not present
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
## 2022-03-23 ENCOUNTER — Encounter: Payer: Self-pay | Admitting: Physical Therapy

## 2022-03-23 ENCOUNTER — Ambulatory Visit: Payer: Medicare Other | Admitting: Physical Therapy

## 2022-03-23 ENCOUNTER — Ambulatory Visit: Payer: Medicare Other | Admitting: Occupational Therapy

## 2022-03-23 DIAGNOSIS — M6281 Muscle weakness (generalized): Secondary | ICD-10-CM

## 2022-03-23 DIAGNOSIS — R2689 Other abnormalities of gait and mobility: Secondary | ICD-10-CM | POA: Diagnosis not present

## 2022-03-23 DIAGNOSIS — R2681 Unsteadiness on feet: Secondary | ICD-10-CM | POA: Diagnosis not present

## 2022-03-23 DIAGNOSIS — R278 Other lack of coordination: Secondary | ICD-10-CM | POA: Diagnosis not present

## 2022-03-23 DIAGNOSIS — I69351 Hemiplegia and hemiparesis following cerebral infarction affecting right dominant side: Secondary | ICD-10-CM | POA: Diagnosis not present
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
## 2022-03-24 ENCOUNTER — Ambulatory Visit (INDEPENDENT_AMBULATORY_CARE_PROVIDER_SITE_OTHER): Payer: Medicare Other

## 2022-03-24 ENCOUNTER — Telehealth: Payer: Medicare Other | Admitting: Primary Care

## 2022-03-24 DIAGNOSIS — E538 Deficiency of other specified B group vitamins: Secondary | ICD-10-CM | POA: Diagnosis not present

## 2022-03-24 MED ORDER — CYANOCOBALAMIN 1000 MCG/ML IJ SOLN
1000.0000 ug | Freq: Once | INTRAMUSCULAR | Status: AC
  Administered 2022-03-24: 1000 ug via INTRAMUSCULAR

## 2022-03-24 NOTE — Progress Notes (Signed)
Pt came in for B-12 injection today and was administered in left deltoid. Pt advised he has been getting injections for 6-7 years never had issues in the past. Pt tolerated injection well.

## 2022-03-25 ENCOUNTER — Ambulatory Visit: Payer: Medicare Other

## 2022-03-25 ENCOUNTER — Ambulatory Visit: Payer: Medicare Other | Admitting: Occupational Therapy

## 2022-03-25 DIAGNOSIS — R2681 Unsteadiness on feet: Secondary | ICD-10-CM

## 2022-03-25 DIAGNOSIS — I69351 Hemiplegia and hemiparesis following cerebral infarction affecting right dominant side: Secondary | ICD-10-CM

## 2022-03-25 DIAGNOSIS — R2689 Other abnormalities of gait and mobility: Secondary | ICD-10-CM

## 2022-03-25 DIAGNOSIS — R278 Other lack of coordination: Secondary | ICD-10-CM

## 2022-03-25 DIAGNOSIS — M6281 Muscle weakness (generalized): Secondary | ICD-10-CM
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
## 2022-03-25 NOTE — Patient Instructions (Signed)
Driving After a Stroke Driving after a stroke can be dangerous because a stroke can cause physical, emotional, cognitive, and behavioral changes. Damage to your brain and other parts of your nervous system may affect your ability to drive. You may have weakness, stiffness, and pain. You also may have problems moving, talking, seeing, touching, or problem-solving. A stroke can also cause an inability to move, or paralysis, on one side of your body. How does a stroke affect my driving? A family member may be the first to notice that it is not safe for you to drive. You may have problems with: Your vision. Talking and communicating. Weakness, pain, and stiffness in your arms or legs. Reacting quickly to changes on the road. Using the steering wheel, pedals, and other parts of the car. Thinking while driving. Judgment on the road. How do I recognize changes in my ability to drive? If you do drive, signs that driving may be unsafe for you include: Driving too fast or too slow. Needing help from others while driving. Not paying attention to street signs or signals. Making bad decisions while driving. Not keeping enough distance between cars. Drifting into other lanes. Becoming confused, angry, or frustrated. Getting lost in familiar places. Having accidents while driving. What actions can I take to manage driving after a stroke?  Talk to your health care provider Ask your health care provider when it is safe for you to drive. Laws on driving after a stroke vary by state. Your health care provider may recommend that you: Get a driving evaluation to have your vision, thinking, reaction time, and driving skills tested. Take a driving rehabilitation program for people who have had a stroke. Take a driving class or a retraining program. Ask about safety devices for drivers Adaptive equipment refers to devices that can help people who have had a stroke to drive and do other activities. You may  need: A wheelchair-accessible car. Special hand controls in the car. Pedal extensions for the car. A seat base to help you stay positioned in your seat. Lifts and ramps to help you get in and out of the car. Where to find more information American Stroke Association: www.stroke.org Summary Damage to your brain and other parts of your nervous system may affect your ability to drive. Ask your health care provider when it is safe for you to drive. They may recommend that you get a driving evaluation or take a driving class. A family member may be the first to notice that it is not safe for you to drive. You may need adaptive equipment to drive safely. This information is not intended to replace advice given to you by your health care provider. Make sure you discuss any questions you have with your health care provider. Document Revised: 10/27/2021 Document Reviewed: 10/27/2021 Elsevier Patient Education  2023 [employer].  Librarian, academic Programs:  Comprehensive Evaluation: includes clinical and in vehicle behind the wheel testing by OCCUPATIONAL THERAPIST. Programs have varying levels of adaptive controls available for trial.   [employer], [place] [address] [place], [place]  [address] [phone] or [phone] [email or web address] Evaluator:  Richelle Ito, OT/CDRS/CDI/SCDCM/[facility] [address] [place], [place] [address] [phone] [email or web address] Evaluators:  Bertram Savin, OT and Mertie Clause, OT  [facility] [name] [place], [place]  [address] [phone] [email or web address] Evaluators:  Bernadene Bell, KT;  Heron Sabins, KT;  Shirlee Latch, KT (KT=kiniesotherapist)   Clinical evaluations only:  Includes clinical testing, refers to other programs or local certified driving instructor for behind the wheel testing.  [facility] (outpatient Re[facility]) [place] [address] [place], [place] [address] [phone] for scheduling [email or web address] Evaluators:  Valentino Hue, OT; Haynes Hoehn, OT  Other area clinical evaluators available upon request including [facility], [facility] and [facility]       Resource List What is a Warden/ranger: Your Road Ahead - A Guide to [employer] Evaluations http://www.thehartford.com/resources/mature-market-excellence/publications-on-aging  Association for Musician - Disability and Driving Fact Sheets http://www.aded.net/?page=510  Driving after a Brain Injury: Brain Injury Association of [place].be?A=SearchResult&SearchID=9495675&ObjectID=2758842&ObjectType=35  Driving with Adaptive Equipment: Chiropractor Association DebtRide.com.au
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
## 2022-03-30 ENCOUNTER — Encounter: Payer: Self-pay | Admitting: Physical Therapy

## 2022-03-30 ENCOUNTER — Ambulatory Visit: Payer: Medicare Other | Admitting: Physical Therapy

## 2022-03-30 DIAGNOSIS — M6281 Muscle weakness (generalized): Secondary | ICD-10-CM

## 2022-03-30 DIAGNOSIS — R278 Other lack of coordination: Secondary | ICD-10-CM | POA: Diagnosis not present

## 2022-03-30 DIAGNOSIS — R2689 Other abnormalities of gait and mobility: Secondary | ICD-10-CM | POA: Diagnosis not present

## 2022-03-30 DIAGNOSIS — I69351 Hemiplegia and hemiparesis following cerebral infarction affecting right dominant side: Secondary | ICD-10-CM | POA: Diagnosis not present

## 2022-03-30 DIAGNOSIS — R2681 Unsteadiness on feet: Secondary | ICD-10-CM
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
## 2022-04-01 ENCOUNTER — Encounter: Payer: Self-pay | Admitting: Physical Therapy

## 2022-04-01 ENCOUNTER — Ambulatory Visit: Payer: Medicare Other | Admitting: Physical Therapy

## 2022-04-01 DIAGNOSIS — M6281 Muscle weakness (generalized): Secondary | ICD-10-CM | POA: Diagnosis not present

## 2022-04-01 DIAGNOSIS — R2689 Other abnormalities of gait and mobility: Secondary | ICD-10-CM

## 2022-04-01 DIAGNOSIS — R2681 Unsteadiness on feet: Secondary | ICD-10-CM | POA: Diagnosis not present

## 2022-04-01 DIAGNOSIS — I69351 Hemiplegia and hemiparesis following cerebral infarction affecting right dominant side: Secondary | ICD-10-CM | POA: Diagnosis not present

## 2022-04-01 DIAGNOSIS — R278 Other lack of coordination: Secondary | ICD-10-CM | POA: Diagnosis not present

## 2022-04-06 ENCOUNTER — Ambulatory Visit: Payer: Medicare Other | Admitting: Physical Therapy

## 2022-04-06 ENCOUNTER — Encounter: Payer: Self-pay | Admitting: Physical Therapy

## 2022-04-06 DIAGNOSIS — M6281 Muscle weakness (generalized): Secondary | ICD-10-CM

## 2022-04-06 DIAGNOSIS — R278 Other lack of coordination: Secondary | ICD-10-CM | POA: Diagnosis not present

## 2022-04-06 DIAGNOSIS — R2681 Unsteadiness on feet: Secondary | ICD-10-CM | POA: Diagnosis not present

## 2022-04-06 DIAGNOSIS — R2689 Other abnormalities of gait and mobility: Secondary | ICD-10-CM | POA: Diagnosis not present

## 2022-04-06 DIAGNOSIS — I69351 Hemiplegia and hemiparesis following cerebral infarction affecting right dominant side: Secondary | ICD-10-CM | POA: Diagnosis not present
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
## 2022-04-08 ENCOUNTER — Ambulatory Visit: Payer: Medicare Other | Admitting: Physical Therapy

## 2022-04-08 ENCOUNTER — Encounter: Payer: Self-pay | Admitting: Physical Therapy

## 2022-04-08 DIAGNOSIS — R2681 Unsteadiness on feet: Secondary | ICD-10-CM

## 2022-04-08 DIAGNOSIS — I69351 Hemiplegia and hemiparesis following cerebral infarction affecting right dominant side: Secondary | ICD-10-CM | POA: Diagnosis not present

## 2022-04-08 DIAGNOSIS — R278 Other lack of coordination: Secondary | ICD-10-CM | POA: Diagnosis not present

## 2022-04-08 DIAGNOSIS — R2689 Other abnormalities of gait and mobility: Secondary | ICD-10-CM | POA: Diagnosis not present

## 2022-04-08 DIAGNOSIS — M6281 Muscle weakness (generalized): Secondary | ICD-10-CM | POA: Diagnosis not present
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
## 2022-04-15 ENCOUNTER — Encounter: Payer: Self-pay | Admitting: Physical Therapy

## 2022-04-15 ENCOUNTER — Ambulatory Visit: Payer: Medicare Other | Attending: Physician Assistant | Admitting: Physical Therapy

## 2022-04-15 DIAGNOSIS — M6281 Muscle weakness (generalized): Secondary | ICD-10-CM | POA: Diagnosis not present

## 2022-04-15 DIAGNOSIS — R2689 Other abnormalities of gait and mobility: Secondary | ICD-10-CM | POA: Insufficient documentation

## 2022-04-15 DIAGNOSIS — R2681 Unsteadiness on feet: Secondary | ICD-10-CM | POA: Insufficient documentation
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
## 2022-04-20 ENCOUNTER — Ambulatory Visit: Payer: Medicare Other | Admitting: Physical Therapy

## 2022-04-20 ENCOUNTER — Encounter: Payer: Self-pay | Admitting: Physical Therapy

## 2022-04-20 DIAGNOSIS — R2681 Unsteadiness on feet: Secondary | ICD-10-CM | POA: Diagnosis not present

## 2022-04-20 DIAGNOSIS — M6281 Muscle weakness (generalized): Secondary | ICD-10-CM | POA: Diagnosis not present

## 2022-04-20 DIAGNOSIS — R2689 Other abnormalities of gait and mobility: Secondary | ICD-10-CM

## 2022-04-21 ENCOUNTER — Other Ambulatory Visit: Payer: BLUE CROSS/BLUE SHIELD
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
## 2022-04-21 NOTE — Progress Notes (Signed)
Chronic Care Management Pharmacy Note  04/28/2022 Name:  Mark Jackson. MRN:  [id number] DOB:  [birth year]  Summary: PharmD follow up.  He never refilled Zetia after June.  Previous stroke most recent LDL above goal < 70.  Also continues to have headaches and BP last 3 times in office have been elevated.  Recommendations/Changes made from today's visit: Do we want to pursue Zetia harder for him? Consider re-trial of ARB for better BP control, previous concern for fatigue  Plan: FU 4 months   Subjective: Mark Jackson. is an 88 y.o. year old male who is a primary patient of Hunter, [name], MD.  The CCM team was consulted for assistance with disease management and care coordination needs.    Engaged with patient by telephone for follow up visit in response to provider referral for pharmacy case management and/or care coordination services.   Consent to Services:  The patient was given the following information about Chronic Care Management services today, agreed to services, and gave verbal consent: 1. CCM service includes personalized support from designated clinical staff supervised by the primary care provider, including individualized plan of care and coordination with other care providers 2. 24/7 contact phone numbers for assistance for urgent and routine care needs. 3. Service will only be billed when office clinical staff spend 20 minutes or more in a month to coordinate care. 4. Only one practitioner may furnish and bill the service in a calendar month. 5.The patient may stop CCM services at any time (effective at the end of the month) by phone call to the office staff. 6. The patient will be responsible for cost sharing (co-pay) of up to 20% of the service fee (after annual deductible is met). Patient agreed to services and consent obtained.  Patient Care Team: [name], MD as PCP - General (Family Medicine) [name], MD as PCP - Cardiology  (Cardiology) Lorretta Harp, MD as Consulting Physician (Cardiology) Corliss Parish, MD as Consulting Physician (Nephrology) Lafayette Dragon, MD (Inactive) as Consulting Physician (Gastroenterology) Love, Alyson Locket, MD as Consulting Physician (Neurology) Edythe Clarity, [facility] (Pharmacist)  Recent office visits:  12/11/2021 [facility] (PCP) [name], MD; no medication changes indicated.   Recent consult visits:  02/04/2022 [facility] (Pulmonology) Martyn Ehrich, NP; no medication changes indicated.   01/16/2022 [facility] (Neurology) Pieter Partridge, DO; no medication changes indicated.   Hospital visits:  12/26/2021 ED to Hospital Admission due to Cerebrovascular accident Admit date:     12/26/2021  Discharge date: 12/29/21  -No medication changes      Objective:  Lab Results  Component Value Date   CREATININE 3.13 (H) 03/05/2022   BUN 62 (H) 03/05/2022   GFR 16.96 (L) 03/05/2022   GFRNONAA 21 (L) 01/12/2022   GFRAA 21 11/15/2020   NA 142 03/05/2022   K 5.1 03/05/2022   CALCIUM 8.5 03/05/2022   CO2 23 03/05/2022   GLUCOSE 101 (H) 03/05/2022    Lab Results  Component Value Date/Time   HGBA1C 5.8 (H) 12/27/2021 02:45 AM   HGBA1C 5.8 12/11/2021 11:17 AM   GFR 16.96 (L) 03/05/2022 02:21 PM   GFR 18.31 (L) 12/11/2021 11:17 AM    Last diabetic Eye exam: No results found for: "HMDIABEYEEXA"  Last diabetic Foot exam: No results found for: "HMDIABFOOTEX"   Lab Results  Component Value Date   CHOL 132 12/27/2021   HDL 39 (L) 12/27/2021   LDLCALC 80 12/27/2021  LDLDIRECT 89.0 03/05/2022   TRIG 63 12/27/2021   CHOLHDL 3.4 12/27/2021       Latest Ref Rng & Units 03/05/2022    2:21 PM 12/30/2021    5:32 AM 12/28/2021    1:52 AM  Hepatic Function  Total Protein 6.0 - 8.3 g/dL 6.4  5.8  5.6   Albumin 3.5 - 5.2 g/dL 3.6  2.7  2.8   AST 0 - 37 U/L _0 ALT 0 - 53 U/L _1 Alk Phosphatase 39 - 117 U/L 57  46  47   Total Bilirubin 0.2 - 1.2 mg/dL 0.4  0.9   0.4     Lab Results  Component Value Date/Time   TSH 3.03 03/05/2022 02:21 PM   TSH 3.970 12/11/2020 12:33 PM       Latest Ref Rng & Units 03/05/2022    2:21 PM 01/12/2022    6:03 AM 01/05/2022    5:59 AM  CBC  WBC 4.0 - 10.5 K/uL 10.0  10.9  10.5   Hemoglobin 13.0 - 17.0 g/dL 11.2  9.5  9.8   Hematocrit 39.0 - 52.0 % 33.5  28.3  29.6   Platelets 150.0 - 400.0 K/uL 134.0  206  195     No results found for: "VD25OH"  Clinical ASCVD: Yes  The ASCVD Risk score (Arnett DK, et al., 2019) failed to calculate for the following reasons:   The 2019 ASCVD risk score is only valid for ages 20 to 37   The patient has a prior MI or stroke diagnosis       03/05/2022    1:15 PM 02/16/2022    1:22 PM 11/28/2021    1:04 PM  Depression screen PHQ 2/9  Decreased Interest 0 0 0  Down, Depressed, Hopeless 0 0 1  PHQ - 2 Score 0 0 1  Altered sleeping 1 3   Tired, decreased energy 1 3   Change in appetite 1 2   Feeling bad or failure about yourself  0 0   Trouble concentrating 1 0   Moving slowly or fidgety/restless 0 0   Suicidal thoughts  0   PHQ-9 Score 4 8   Difficult doing work/chores Somewhat difficult       Social History   Tobacco Use  Smoking Status Former   Types: Pipe, Cigars   Quit date: 08/10/1968   Years since quitting: 53.7  Smokeless Tobacco Never   BP Readings from Last 3 Encounters:  03/11/22 (!) 156/68  03/05/22 (!) 144/74  02/16/22 (!) 158/74   Pulse Readings from Last 3 Encounters:  03/11/22 81  03/05/22 77  02/16/22 65   Wt Readings from Last 3 Encounters:  03/11/22 190 lb 6.4 oz (86.4 kg)  03/05/22 189 lb 6.4 oz (85.9 kg)  02/16/22 191 lb (86.6 kg)   BMI Readings from Last 3 Encounters:  03/11/22 28.95 kg/m  03/05/22 28.80 kg/m  02/16/22 29.04 kg/m    Assessment/Interventions: Review of patient past medical history, allergies, medications, health status, including review of consultants reports, laboratory and other test data, was performed as  part of comprehensive evaluation and provision of chronic care management services.   SDOH:  (Social Determinants of Health) assessments and interventions performed: No, done within year  Financial Resource Strain: Low Risk  (11/28/2021)   Overall Financial Resource Strain (CARDIA)    Difficulty of Paying Living Expenses: Not hard at all    SDOH  Screenings   Food Insecurity: No Food Insecurity (11/28/2021)  Housing: Low Risk  (11/28/2021)  Transportation Needs: No Transportation Needs (11/28/2021)  Depression (PHQ2-9): Low Risk  (03/05/2022)  Recent Concern: Depression (PHQ2-9) - Medium Risk (02/16/2022)  Financial Resource Strain: Low Risk  (11/28/2021)  Physical Activity: Insufficiently Active (11/28/2021)  Social Connections: Moderately Integrated (11/28/2021)  Stress: No Stress Concern Present (11/28/2021)  Tobacco Use: Medium Risk (04/22/2022)    CCM Care Plan  No Known Allergies  Medications Reviewed Today     Reviewed by Edythe Clarity, [facility] (Pharmacist) on 04/28/22 at 51  Med List Status: <None>   Medication Order Taking? Sig Documenting Provider Last Dose Status Informant  acetaminophen (TYLENOL) 325 MG tablet [id number] Yes Take 2 tablets (650 mg total) by mouth every 4 (four) hours as needed for mild pain (or temp > 37.5 C (99.5 F)). Cathlyn Parsons, PA-C Taking Active   apixaban (ELIQUIS) 2.5 MG TABS tablet [id number] Yes Take 1 tablet (2.5 mg total) by mouth 2 (two) times daily. Cathlyn Parsons, PA-C Taking Active   diclofenac Sodium (VOLTAREN) 1 % GEL [id number] Yes Apply 2 g topically 4 (four) times daily. AngiulliLavon Paganini, PA-C Taking Active   diltiazem (CARDIZEM CD) 240 MG 24 hr capsule [id number] Yes Take 1 capsule (240 mg total) by mouth daily. Cathlyn Parsons, PA-C Taking Active   ferrous sulfate 325 (65 FE) MG tablet [id number] Yes Take 325 mg by mouth daily with breakfast. [provider] Taking Active Child           Med Note Jeananne Rama, Brendolyn Patty  Jan 01, 2022 11:28 AM) PTA med from prior admission carried over for CIR discharge reconciliation  FLUoxetine (PROZAC) 10 MG capsule [id number] Yes Take 1 capsule (10 mg total) by mouth daily. Jennye Boroughs, MD Taking Active   furosemide (LASIX) 40 MG tablet [id number] Yes Take 0.5 tablets (20 mg total) by mouth daily. Cathlyn Parsons, PA-C Taking Active   isosorbide mononitrate (IMDUR) 60 MG 24 hr tablet [id number] Yes TAKE ONE TAB DAILY AFTER BREAKFAST. MAY TAKE AN ADDITONAL TAB IN EVENING X2DAYS IF USE NITROGLYCER Angiulli, Lavon Paganini, PA-C Taking Active   Multiple Vitamins-Minerals (CENTRUM SILVER ADULT 50+) TABS [id number] Yes Take 1 tablet by mouth daily. [provider] Taking Active Child  nitroGLYCERIN (NITROSTAT) 0.4 MG SL tablet [id number] Yes ONE TABLET UNDER TONGUE WHEN NEEDED FOR CHEST PAIN. MAY REPEAT IN 5 MINUTES.  Patient taking differently: Place 0.4 mg under the tongue every 5 (five) minutes as needed for chest pain.   [name], MD Taking Active Child, Pharmacy Records  rosuvastatin (CRESTOR) 20 MG tablet [id number] Yes TAKE (1) TABLET DAILY AT BEDTIME. [name], MD Taking Active   topiramate (TOPAMAX) 100 MG tablet [id number] Yes Take 1.5 tablets (150 mg total) by mouth daily. Cathlyn Parsons, PA-C Taking Active             Patient Active Problem List   Diagnosis Date Noted   Right hemiparesis ([place]) 03/05/2022   Situational depression 03/05/2022   Loud snoring 02/04/2022   Thrombocytopenia, unspecified ([place]) 12/11/2021   Hematuria 12/11/2020   Chronic diastolic heart failure ([place]) 12/12/2019   S/P TAVR (transcatheter aortic valve replacement) 12/12/2019   Syncope and collapse 11/27/2019   Fatigue 11/08/2017   Hyperglycemia 09/27/2017   Bilateral lower extremity edema 04/15/2017   B12 deficiency 01/06/2017   Chronic diarrhea 01/06/2017   BPH associated with nocturia 06/15/2016   Perianal  dermatitis 06/19/2015   Rectal bleeding 04/25/2015    CAD S/P DES PCI to proximal LAD 03/22/2015   Long term current use of anticoagulant therapy 08/27/2014   Paroxysmal atrial fibrillation ([place]); CHA2DSVasc - 4; Now on Eliquis 08/20/2014    Class: Diagnosis of   Chronic kidney disease (CKD), active medical management without dialysis, stage 4 (severe) ([place]) 08/20/2014   Hereditary and idiopathic peripheral neuropathy 01/12/2014   H/O syncope 09/03/2013   Right-sided carotid artery disease; followed by Dr. Trula Slade 03/02/2013   Hyperlipidemia with target LDL less than 70 03/02/2013   Migraine without aura 10/26/2012   Anemia 10/23/2008   GLAUCOMA 10/23/2008   Essential hypertension 10/22/2008   Arthropathy 10/22/2008   Excessive daytime sleepiness 10/22/2008   Personal history of prostate cancer 10/22/2008   History of colonic polyps 10/22/2008    Immunization History  Administered Date(s) Administered   Fluad Quad(high Dose 65+) 05/11/2019, 05/16/2020   Influenza Inj Mdck Quad Pf 05/26/2016   Influenza Inj Mdck Quad With Preservative 05/26/2016   Influenza Split 04/21/2012, 06/24/2014   Influenza,inj,Quad PF,6+ Mos 05/26/2016   Influenza-Unspecified 05/26/2016, 06/10/2017, 05/10/2018   Moderna Covid-19 Vaccine Bivalent Booster 68yr & up 07/22/2021   Moderna SARS-COV2 Booster Vaccination 10/24/2020   Moderna Sars-Covid-2 Vaccination 03/14/2020, 04/11/2020   Pneumococcal Conjugate-13 05/15/2014   Pneumococcal Polysaccharide-23 09/28/2016   Pneumococcal-Unspecified 09/28/2016   Tdap 03/02/2019   Zoster Recombinat (Shingrix) 02/11/2018, 05/17/2018   Zoster, Live 08/10/2012    Conditions to be addressed/monitored:  HTN, Afib, HLD  Care Plan : General Pharmacy (Adult)  Updates made by DEdythe Clarity RPH since 04/28/2022 12:00 AM     Problem: HTN, HLD, Afib   Priority: High  Onset Date: 04/15/2021     Long-Range Goal: Patient-Specific Goal   Start Date: 04/15/2021  Expected End Date: 10/14/2021  Recent Progress: On track   Priority: High  Note:   Current Barriers:  Unable to achieve control of BP   Pharmacist Clinical Goal(s):  Patient will achieve adherence to monitoring guidelines and medication adherence to achieve therapeutic efficacy achieve control of BP as evidenced by monitoring contact provider office for questions/concerns as evidenced notation of same in electronic health record through collaboration with PharmD and provider.   Interventions: 1:1 collaboration with HMarin Olp MD regarding development and update of comprehensive plan of care as evidenced by provider attestation and co-signature Inter-disciplinary care team collaboration (see longitudinal plan of care) Comprehensive medication review performed; medication list updated in electronic medical record  Hypertension (BP goal <140/90) 04/28/22 -Uncontrolled based on office BP, not checking at home -Current treatment: Isosorbide 640m24hr daily Appropriate, Query effective, ,  Diltiazem CD 1205maily Appropriate, Query effective, -Medications previously tried: Metoprolol XL  -Current home readings: not checking at home -Reports hypotensive/hypertensive symptoms - daily headaches (migraines) -Educated on BP goals and benefits of medications for prevention of heart attack, stroke and kidney damage; Importance of home blood pressure monitoring; Symptoms of hypotension and importance of maintaining adequate hydration; -Counseled to monitor BP at home as able, document, and provide log at future appointments -BP continues to be elevated, has migraines daily. -He has previously not been on ACE/ARB due to concerns of fatigue, last three office BP above goal. Consider retrial of ARB to bring BP back closer to goal?  Hyperlipidemia: (LDL goal < 70) 04/28/22 -Uncontrolled, last LDL of 89 above goal -Current treatment: Rosuvastatin 49m8mily Appropriate, Query effective,  -Medications previously tried: none noted  -Educated on  Cholesterol goals;  Benefits of statin for ASCVD risk reduction; Importance of limiting foods high in cholesterol; -Patient was prescribed Zetia after stroke.  At this time he was not taking it.  Looks like it was only filled once.  Discussed risk of elevated cholesterol on CV risk.  Will discuss with PCP team on if we should pursue him taking this.  Update 10/20/21 Last lipid panel was in 2021.  LDL was well controlled at this time.  Reports adherence to statin with no concerns. Recommended FU appt with PCP for labs and routine physical. Patient agreeable  Atrial Fibrillation (Goal: prevent stroke and major bleeding) -Controlled -CHADSVASC: 4 -Current treatment: Rate control: Diltiazem CD 120m 24hr daily Appropriate, Effective, Safe, Accessible Anticoagulation: Eliquis 2.544mBID Appropriate, Effective, Safe, Accessible -Medications previously tried: none noted -Home BP and HR readings: not checking at home  -Counseled on increased risk of stroke due to Afib and benefits of anticoagulation for stroke prevention; bleeding risk associated with Eliquis and importance of self-monitoring for signs/symptoms of bleeding; -He is on appropriate dose of Eliquis due to age and Scr. -Recommend f/u on labs at next visit with Dr. HuYong Channel-Recommended to continue current medication  Update 10/20/21 Eliquis dose still appropriate based on renal function/age. Eliquis copay is affordable, no concerns at this time.  He is not monitoring BP at home.  Does mention occasional dizziness and headaches.  Occur randomly throughout the day no specific timing. Encouraged occasional BP checks if able. No further changes at this time.    Patient Goals/Self-Care Activities Patient will:  - take medications as prescribed focus on medication adherence by pill box check blood pressure as able, document, and provide at future appointments  Follow Up Plan: The care management team will reach out to the patient  again over the next 120 days.              Medication Assistance: None required.  Patient affirms current coverage meets needs.  Compliance/Adherence/Medication fill history: Care Gaps: None identified  Star-Rating Drugs: Rosuvastatin 2042m6/05/23 90ds - patient confirms supply on hand  Patient's preferred pharmacy is:  [address] [id number]: [phone]: [phone] [place]: [phone]: [phone] We discussed: Benefits of medication synchronization, packaging and delivery as well as enhanced pharmacist oversight with [employer]. Patient decided to: Continue current medication management strategy  Care Plan and Follow Up Patient Decision:  Patient agrees to Care Plan and Follow-up.  Plan: The care management team will reach out to the patient again over the next 180 days.  ChrBeverly MilchharmD Clinical Pharmacist [phone]

## 2022-04-22 ENCOUNTER — Encounter: Payer: Self-pay | Admitting: Physical Therapy

## 2022-04-22 ENCOUNTER — Ambulatory Visit: Payer: Medicare Other | Admitting: Physical Therapy

## 2022-04-22 DIAGNOSIS — R2681 Unsteadiness on feet: Secondary | ICD-10-CM | POA: Diagnosis not present

## 2022-04-22 DIAGNOSIS — M6281 Muscle weakness (generalized): Secondary | ICD-10-CM

## 2022-04-22 DIAGNOSIS — R2689 Other abnormalities of gait and mobility: Secondary | ICD-10-CM | POA: Diagnosis not present

## 2022-04-23 ENCOUNTER — Ambulatory Visit: Payer: BLUE CROSS/BLUE SHIELD

## 2022-04-23 ENCOUNTER — Other Ambulatory Visit: Payer: BLUE CROSS/BLUE SHIELD

## 2022-04-28 ENCOUNTER — Ambulatory Visit: Payer: Medicare Other | Admitting: Pharmacist

## 2022-04-28 DIAGNOSIS — E785 Hyperlipidemia, unspecified: Secondary | ICD-10-CM

## 2022-04-28 DIAGNOSIS — I10 Essential (primary) hypertension: Secondary | ICD-10-CM

## 2022-04-28 NOTE — Patient Instructions (Addendum)
Visit Information   Goals Addressed             This Visit's Progress    Track and Manage My Blood Pressure-Hypertension   On track    Timeframe:  Long-Range Goal Priority:  High Start Date:   04/15/21                          Expected End Date:  10/13/20                     Follow Up Date 07/15/21    - check blood pressure weekly - choose a place to take my blood pressure (home, clinic or office, retail store)    Why is this important?   You won't feel high blood pressure, but it can still hurt your blood vessels.  High blood pressure can cause heart or kidney problems. It can also cause a stroke.  Making lifestyle changes like losing a little weight or eating less salt will help.  Checking your blood pressure at home and at different times of the day can help to control blood pressure.  If the doctor prescribes medicine remember to take it the way the doctor ordered.  Call the office if you cannot afford the medicine or if there are questions about it.     Notes:        Patient Care Plan: General Pharmacy (Adult)     Problem Identified: HTN, HLD, Afib   Priority: High  Onset Date: 04/15/2021     Long-Range Goal: Patient-Specific Goal   Start Date: 04/15/2021  Expected End Date: 10/14/2021  Recent Progress: On track  Priority: High  Note:   Current Barriers:  Unable to achieve control of BP   Pharmacist Clinical Goal(s):  Patient will achieve adherence to monitoring guidelines and medication adherence to achieve therapeutic efficacy achieve control of BP as evidenced by monitoring contact provider office for questions/concerns as evidenced notation of same in electronic health record through collaboration with PharmD and provider.   Interventions: 1:1 collaboration with [name], MD regarding development and update of comprehensive plan of care as evidenced by provider attestation and co-signature Inter-disciplinary care team collaboration (see  longitudinal plan of care) Comprehensive medication review performed; medication list updated in electronic medical record  Hypertension (BP goal <140/90) 04/28/22 -Uncontrolled based on office BP, not checking at home -Current treatment: Isosorbide '60mg'$  24hr daily Appropriate, Query effective, ,  Diltiazem CD '120mg'$  daily Appropriate, Query effective, -Medications previously tried: Metoprolol XL  -Current home readings: not checking at home -Reports hypotensive/hypertensive symptoms - daily headaches (migraines) -Educated on BP goals and benefits of medications for prevention of heart attack, stroke and kidney damage; Importance of home blood pressure monitoring; Symptoms of hypotension and importance of maintaining adequate hydration; -Counseled to monitor BP at home as able, document, and provide log at future appointments -BP continues to be elevated, has migraines daily. -He has previously not been on ACE/ARB due to concerns of fatigue, last three office BP above goal. Consider retrial of ARB to bring BP back closer to goal?  Hyperlipidemia: (LDL goal < 70) 04/28/22 -Uncontrolled, last LDL of 89 above goal -Current treatment: Rosuvastatin '20mg'$  daily Appropriate, Query effective,  -Medications previously tried: none noted  -Educated on Cholesterol goals;  Benefits of statin for ASCVD risk reduction; Importance of limiting foods high in cholesterol; -Patient was prescribed Zetia after stroke.  At this time he was not taking  it.  Looks like it was only filled once.  Discussed risk of elevated cholesterol on CV risk.  Will discuss with PCP team on if we should pursue him taking this.  Update 10/20/21 Last lipid panel was in 2021.  LDL was well controlled at this time.  Reports adherence to statin with no concerns. Recommended FU appt with PCP for labs and routine physical. Patient agreeable  Atrial Fibrillation (Goal: prevent stroke and major bleeding) -Controlled -CHADSVASC:  4 -Current treatment: Rate control: Diltiazem CD '120mg'$  24hr daily Appropriate, Effective, Safe, Accessible Anticoagulation: Eliquis 2.'5mg'$  BID Appropriate, Effective, Safe, Accessible -Medications previously tried: none noted -Home BP and HR readings: not checking at home  -Counseled on increased risk of stroke due to Afib and benefits of anticoagulation for stroke prevention; bleeding risk associated with Eliquis and importance of self-monitoring for signs/symptoms of bleeding; -He is on appropriate dose of Eliquis due to age and Scr. -Recommend f/u on labs at next visit with Dr. [name]. -Recommended to continue current medication  Update 10/20/21 Eliquis dose still appropriate based on renal function/age. Eliquis copay is affordable, no concerns at this time.  He is not monitoring BP at home.  Does mention occasional dizziness and headaches.  Occur randomly throughout the day no specific timing. Encouraged occasional BP checks if able. No further changes at this time.    Patient Goals/Self-Care Activities Patient will:  - take medications as prescribed focus on medication adherence by pill box check blood pressure as able, document, and provide at future appointments  Follow Up Plan: The care management team will reach out to the patient again over the next 120 days.            The patient verbalized understanding of instructions, educational materials, and care plan provided today and DECLINED offer to receive copy of patient instructions, educational materials, and care plan.  Telephone follow up appointment with pharmacy team member scheduled for: 4 months   Edythe Clarity, [employer], PharmD Clinical Pharmacist  [facility] [phone]

## 2022-04-29 ENCOUNTER — Ambulatory Visit (INDEPENDENT_AMBULATORY_CARE_PROVIDER_SITE_OTHER): Payer: Medicare Other

## 2022-04-29 ENCOUNTER — Encounter: Payer: Self-pay | Admitting: Oncology

## 2022-04-29 DIAGNOSIS — Z23 Encounter for immunization: Secondary | ICD-10-CM | POA: Diagnosis not present

## 2022-04-29 DIAGNOSIS — E538 Deficiency of other specified B group vitamins: Secondary | ICD-10-CM | POA: Diagnosis not present

## 2022-04-29 MED ORDER — CYANOCOBALAMIN 1000 MCG/ML IJ SOLN
1000.0000 ug | Freq: Once | INTRAMUSCULAR | Status: AC
  Administered 2022-04-29: 1000 ug via INTRAMUSCULAR

## 2022-04-29 NOTE — Progress Notes (Signed)
Patient was given b12 injection per Dr.Hunter. Patient tolerated injection well. TL,CMA

## 2022-05-07 ENCOUNTER — Encounter: Payer: Self-pay | Admitting: Family Medicine

## 2022-05-07 ENCOUNTER — Ambulatory Visit (INDEPENDENT_AMBULATORY_CARE_PROVIDER_SITE_OTHER): Payer: Medicare Other | Admitting: Family Medicine

## 2022-05-07 VITALS — BP 130/72 | HR 66 | Temp 97.9°F | Ht 68.0 in | Wt 193.6 lb

## 2022-05-07 DIAGNOSIS — I480 Paroxysmal atrial fibrillation: Secondary | ICD-10-CM

## 2022-05-07 DIAGNOSIS — N184 Chronic kidney disease, stage 4 (severe): Secondary | ICD-10-CM | POA: Diagnosis not present

## 2022-05-07 DIAGNOSIS — I5032 Chronic diastolic (congestive) heart failure: Secondary | ICD-10-CM | POA: Diagnosis not present

## 2022-05-07 DIAGNOSIS — I25119 Atherosclerotic heart disease of native coronary artery with unspecified angina pectoris: Secondary | ICD-10-CM

## 2022-05-07 DIAGNOSIS — G43009 Migraine without aura, not intractable, without status migrainosus: Secondary | ICD-10-CM

## 2022-05-07 DIAGNOSIS — I6521 Occlusion and stenosis of right carotid artery: Secondary | ICD-10-CM

## 2022-05-07 MED ORDER — EZETIMIBE 10 MG PO TABS: 10.0000 mg | ORAL_TABLET | Freq: Every day | ORAL | 3 refills | Status: DC

## 2022-05-07 NOTE — Patient Instructions (Addendum)
Could try 1 tylenol arthritis at 650 mg or if needed even up to 2 since we are limited in our ability to use nsaids like advil  Restart zetia/ezetimibe 10 mg for cholesterol   Team please update phq9 so we can see how he does with the fluoxetine which he plans to start taking  Taking the medicine as directed and not missing any doses is one of the best things you can do to treat your depression.  Here are some things to keep in mind:  Side effects (stomach upset, some increased anxiety) may happen before you notice a benefit.  These side effects typically go away over time. Changes to your dose of medicine or a change in medication all together is sometimes necessary Most people need to be on medication at least 6-12 months Many people will notice an improvement within two weeks but the full effect of the medication can take up to 4-6 weeks Stopping the medication when you start feeling better often results in a return of symptoms If you start having thoughts of hurting yourself or others after starting this medicine, call our office immediately at [phone] or seek care through [phone].     Recommended follow up: Return in about 2 months (around 07/07/2022) for followup or sooner if needed.Schedule b4 you leave.

## 2022-05-07 NOTE — Progress Notes (Signed)
Phone [phone] In person visit   Subjective:   Mark Jackson. is a 88 y.o. year old very pleasant male patient who presents for/with See problem oriented charting Chief Complaint  Patient presents with   Follow-up   Hypertension   Past Medical History-  Patient Active Problem List   Diagnosis Date Noted   Right hemiparesis ([place]) 03/05/2022    Priority: High   Chronic diastolic heart failure ([place]) 12/12/2019    Priority: High   S/P TAVR (transcatheter aortic valve replacement) 12/12/2019    Priority: High   CAD S/P DES PCI to proximal LAD 03/22/2015    Priority: High   Paroxysmal atrial fibrillation ([place]); CHA2DSVasc - 4; Now on Eliquis 08/20/2014    Priority: High    Class: Diagnosis of   Chronic kidney disease (CKD), active medical management without dialysis, stage 4 (severe) ([place]) 08/20/2014    Priority: High   Personal history of prostate cancer 10/22/2008    Priority: High   Hyperglycemia 09/27/2017    Priority: Medium    B12 deficiency 01/06/2017    Priority: Medium    BPH associated with nocturia 06/15/2016    Priority: Medium    Hereditary and idiopathic peripheral neuropathy 01/12/2014    Priority: Medium    H/O syncope 09/03/2013    Priority: Medium    Right-sided carotid artery disease; followed by Dr. Trula Slade 03/02/2013    Priority: Medium    Hyperlipidemia with target LDL less than 70 03/02/2013    Priority: Medium    Migraine without aura 10/26/2012    Priority: Medium    Anemia 10/23/2008    Priority: Medium    Essential hypertension 10/22/2008    Priority: Medium    Excessive daytime sleepiness 10/22/2008    Priority: Medium    Thrombocytopenia, unspecified ([place]) 12/11/2021    Priority: Low   Syncope and collapse 11/27/2019    Priority: Low   Fatigue 11/08/2017    Priority: Low   Chronic diarrhea 01/06/2017    Priority: Low   Perianal dermatitis 06/19/2015    Priority: Low   Rectal bleeding 04/25/2015    Priority: Low   Long  term current use of anticoagulant therapy 08/27/2014    Priority: Low   GLAUCOMA 10/23/2008    Priority: Low   Arthropathy 10/22/2008    Priority: Low   History of colonic polyps 10/22/2008    Priority: Low   Situational depression 03/05/2022   Loud snoring 02/04/2022   Hematuria 12/11/2020   Bilateral lower extremity edema 04/15/2017    Medications- reviewed and updated Current Outpatient Medications  Medication Sig Dispense Refill   acetaminophen (TYLENOL) 325 MG tablet Take 2 tablets (650 mg total) by mouth every 4 (four) hours as needed for mild pain (or temp > 37.5 C (99.5 F)).     apixaban (ELIQUIS) 2.5 MG TABS tablet Take 1 tablet (2.5 mg total) by mouth 2 (two) times daily. 180 tablet 1   diclofenac Sodium (VOLTAREN) 1 % GEL Apply 2 g topically 4 (four) times daily. 2 g 0   diltiazem (CARDIZEM CD) 240 MG 24 hr capsule Take 1 capsule (240 mg total) by mouth daily. 30 capsule 0   ezetimibe (ZETIA) 10 MG tablet Take 1 tablet (10 mg total) by mouth daily. 90 tablet 3   ferrous sulfate 325 (65 FE) MG tablet Take 325 mg by mouth daily with breakfast.     FLUoxetine (PROZAC) 10 MG capsule Take 1 capsule (10 mg total) by mouth daily.  30 capsule 2   furosemide (LASIX) 40 MG tablet Take 0.5 tablets (20 mg total) by mouth daily. 30 tablet 0   isosorbide mononitrate (IMDUR) 60 MG 24 hr tablet TAKE ONE TAB DAILY AFTER BREAKFAST. MAY TAKE AN ADDITONAL TAB IN EVENING X2DAYS IF USE NITROGLYCER 120 tablet 3   Multiple Vitamins-Minerals (CENTRUM SILVER ADULT 50+) TABS Take 1 tablet by mouth daily.     nitroGLYCERIN (NITROSTAT) 0.4 MG SL tablet ONE TABLET UNDER TONGUE WHEN NEEDED FOR CHEST PAIN. MAY REPEAT IN 5 MINUTES. (Patient taking differently: Place 0.4 mg under the tongue every 5 (five) minutes as needed for chest pain.) 25 tablet 0   rosuvastatin (CRESTOR) 20 MG tablet TAKE (1) TABLET DAILY AT BEDTIME. 90 tablet 1   topiramate (TOPAMAX) 100 MG tablet Take 1.5 tablets (150 mg total) by mouth  daily. 135 tablet 3   No current facility-administered medications for this visit.     Objective:  BP 130/72   Pulse 66   Temp 97.9 F (36.6 C)   Ht '5\' 8"'$  (1.727 m)   Wt 193 lb 9.6 oz (87.8 kg)   SpO2 98%   BMI 29.44 kg/m  Gen: NAD, resting comfortably CV: Regular heart rate no murmurs rubs or gallops Lungs: CTAB no crackles, wheeze, rhonchi Ext: Stable 1+ edema at least Skin: warm, dry Neuro: Hard of hearing    Assessment and Plan   #Balance issues after stroke -even with PT and graduating still does not feel confident in balance- tends toward falling backwards.   - working with [employer]- had first training yesterday -using walker everywhere - still feels weakness on right side after stroke and gets some jerkings - also has knee pain which doesn't help -has low energy and sleeping enough - his goal is to get back to cane before a year that he was projected  #CAD status post DES PCI to proximal LAD- sees Dr. Ellyn Hack #History of CVA-Dec 26 2021 hospitalization with right hemiparesis (left thalamic infarction and small left cerebellar infarct thalamic CVA most likely SVD although 2 locations suggests cardioembolic) #hyperlipidemia S: Medication:Aspirin was not added as patient already on Eliquis during May 2023 stroke hospitalization Crestor 20 mg, Zetia 10 mg added after May 2023 stroke (but not taking) -Also takes isosorbide mononitrate 60 mg Lab Results  Component Value Date   CHOL 132 12/27/2021   HDL 39 (L) 12/27/2021   LDLCALC 80 12/27/2021   LDLDIRECT 89.0 03/05/2022   TRIG 63 12/27/2021   CHOLHDL 3.4 12/27/2021   A/P: CAD asymptomatic while on Imdur.  Continue this along with Crestor and Eliquis -Lipids are above goal with last LDL at 89-she did not restart Zetia-instructed him to restart again today and recent medication  # Atrial fibrillation S: Rate controlled with diltiazem '240mg'$  XR  Anticoagulated with eliquis 2.5 mg BID A/P:  Appropriately anticoagulated and rate controlled-continue current medication  #Heart failure with  ejection fraction. CKD IV- active management- not interested in dialysis S: Medication: lasix 20 mg daily  Edema: stableWeight gain:stable he reports- up 4 lbs on our scales but he thinks dietary Shortness of breath: no increase Orthopnea/PND: just 1 pillow, props up feet  -last visit on file 06/2021 with dr. [name] Mane- they will schedule follow up   A/P: Appears largely euvolemic on Lasix 20 mg daily-continue current medication.  Did encourage him with CKD stage IV to schedule follow-up with nephrology-he agrees to do so  # Depression S: Medication: fluoxetine 10 mg was stopped  after only 1 dose- PM&R started  -lower motivation, does not want to go back to wrk which is a change for him No SI    03/05/2022    1:15 PM 02/16/2022    1:22 PM 11/28/2021    1:04 PM  Depression screen PHQ 2/9  Decreased Interest 0 0 0  Down, Depressed, Hopeless 0 0 1  PHQ - 2 Score 0 0 1  Altered sleeping 1 3   Tired, decreased energy 1 3   Change in appetite 1 2   Feeling bad or failure about yourself  0 0   Trouble concentrating 1 0   Moving slowly or fidgety/restless 0 0   Suicidal thoughts  0   PHQ-9 Score 4 8   Difficult doing work/chores Somewhat difficult    A/P: I think PHQ-9 was understated-especially since he does not desire to return to work which has always been a huge driver for him.  Daughter is concerned as well-after discussion today he is agreeable to trial fluoxetine.  Counseling was given on potential side effects and need for taking medicine at least in the 2 to 27-monthrange to see full benefit-recommended 274-monthollow-up  #Migraines--headaches about once a week and tylenol not helping- could try tylenol arthritis- already on topamax for migraine prevention  Recommended follow up: Return in about 2 months (around 07/07/2022) for followup or sooner if needed.Schedule b4 you  leave. Future Appointments  Date Time Provider [place]10/05/2022 10:45 AM Kirsteins, AnLuanna SalkMD [facility] CPR  07/16/2022 11:00 AM HuMarin OlpMD [facility] PEC  07/21/2022  1:50 PM JaPieter PartridgeDO [facility] None  09/04/2022 10:30 AM [facility] CCM PHARMACIST [facility] PEC  09/11/2022  3:00 PM [facility] LAB [facility] None  09/11/2022  3:30 PM ShWyatt PortelaMD [facility] None  12/11/2022  1:00 PM [facility] HEALTH COACH [facility]EC    Lab/Order associations:   ICD-10-CM   1. [place]ox[place]mal [place] ([place] CHA2DSVasc - 4; Now on Eliquis  I48.0     2. Chronic kidney disease (CKD), active medical management without dialysis, stage 4 (severe) ([facility])  N18.4     3. Chronic diastolic heart failure ([facility])  I50.32     4. CAD S/P DES PCI to proximal LAD  I25.119     5. Migraine without aura and without status migrainosus, not intractable  G43.009       Meds ordered this encounter  Medications   ezetimibe (ZETIA) 10 MG tablet    Sig: Take 1 tablet (10 mg total) by mouth daily.    Dispense:  90 tablet    Refill:  3    Return precautions advised.  StGarret ReddishMD

## 2022-05-09 ENCOUNTER — Emergency Department (HOSPITAL_BASED_OUTPATIENT_CLINIC_OR_DEPARTMENT_OTHER)
Admission: EM | Admit: 2022-05-09 | Discharge: 2022-05-09 | Disposition: A | Discharge status: Home / Self Care | Payer: Medicare Other | Attending: Emergency Medicine | Admitting: Emergency Medicine

## 2022-05-09 ENCOUNTER — Emergency Department (HOSPITAL_BASED_OUTPATIENT_CLINIC_OR_DEPARTMENT_OTHER): Payer: Medicare Other | Admitting: Radiology

## 2022-05-09 ENCOUNTER — Encounter (HOSPITAL_BASED_OUTPATIENT_CLINIC_OR_DEPARTMENT_OTHER): Payer: Self-pay | Admitting: Emergency Medicine

## 2022-05-09 ENCOUNTER — Other Ambulatory Visit: Payer: Self-pay

## 2022-05-09 ENCOUNTER — Emergency Department (HOSPITAL_BASED_OUTPATIENT_CLINIC_OR_DEPARTMENT_OTHER): Payer: Medicare Other

## 2022-05-09 DIAGNOSIS — I2510 Atherosclerotic heart disease of native coronary artery without angina pectoris: Secondary | ICD-10-CM | POA: Diagnosis not present

## 2022-05-09 DIAGNOSIS — U071 COVID-19: Secondary | ICD-10-CM | POA: Insufficient documentation

## 2022-05-09 DIAGNOSIS — I129 Hypertensive chronic kidney disease with stage 1 through stage 4 chronic kidney disease, or unspecified chronic kidney disease: Secondary | ICD-10-CM | POA: Diagnosis not present

## 2022-05-09 DIAGNOSIS — Z7901 Long term (current) use of anticoagulants: Secondary | ICD-10-CM | POA: Diagnosis not present

## 2022-05-09 DIAGNOSIS — R519 Headache, unspecified: Secondary | ICD-10-CM | POA: Diagnosis present

## 2022-05-09 DIAGNOSIS — Z79899 Other long term (current) drug therapy: Secondary | ICD-10-CM | POA: Diagnosis not present

## 2022-05-09 DIAGNOSIS — N189 Chronic kidney disease, unspecified: Secondary | ICD-10-CM | POA: Insufficient documentation

## 2022-05-09 DIAGNOSIS — R0602 Shortness of breath: Secondary | ICD-10-CM | POA: Diagnosis not present

## 2022-05-09 DIAGNOSIS — S0990XA Unspecified injury of head, initial encounter: Secondary | ICD-10-CM | POA: Diagnosis not present

## 2022-05-09 LAB — BASIC METABOLIC PANEL
Anion gap: 9 (ref 5–15)
BUN: 59 mg/dL — ABNORMAL HIGH (ref 8–23)
CO2: 22 mmol/L (ref 22–32)
Calcium: 8.8 mg/dL — ABNORMAL LOW (ref 8.9–10.3)
Chloride: 107 mmol/L (ref 98–111)
Creatinine, Ser: 2.94 mg/dL — ABNORMAL HIGH (ref 0.61–1.24)
GFR, Estimated: 20 mL/min — ABNORMAL LOW (ref >60)
Glucose, Bld: 98 mg/dL (ref 70–99)
Potassium: 4.3 mmol/L (ref 3.5–5.1)
Sodium: 138 mmol/L (ref 135–145)

## 2022-05-09 LAB — CBC WITH DIFFERENTIAL/PLATELET
Abs Immature Granulocytes: 0.03 10*3/uL (ref 0.00–0.07)
Basophils Absolute: 0.1 10*3/uL (ref 0.0–0.1)
Basophils Relative: 1 %
Eosinophils Absolute: 0.2 10*3/uL (ref 0.0–0.5)
Eosinophils Relative: 2 %
HCT: 32.9 % — ABNORMAL LOW (ref 39.0–52.0)
Hemoglobin: 10.9 g/dL — ABNORMAL LOW (ref 13.0–17.0)
Immature Granulocytes: 0 %
Lymphocytes Relative: 12 %
Lymphs Abs: 1.2 10*3/uL (ref 0.7–4.0)
MCH: 34 pg (ref 26.0–34.0)
MCHC: 33.1 g/dL (ref 30.0–36.0)
MCV: 102.5 fL — ABNORMAL HIGH (ref 80.0–100.0)
Monocytes Absolute: 2 10*3/uL — ABNORMAL HIGH (ref 0.1–1.0)
Monocytes Relative: 21 %
Neutro Abs: 6.2 10*3/uL (ref 1.7–7.7)
Neutrophils Relative %: 64 %
Platelets: 124 10*3/uL — ABNORMAL LOW (ref 150–400)
RBC: 3.21 MIL/uL — ABNORMAL LOW (ref 4.22–5.81)
RDW: 12.8 % (ref 11.5–15.5)
WBC: 9.7 10*3/uL (ref 4.0–10.5)
nRBC: 0 % (ref 0.0–0.2)

## 2022-05-09 LAB — RESP PANEL BY RT-PCR (FLU A&B, COVID) ARPGX2
Influenza A by PCR: NEGATIVE
Influenza B by PCR: NEGATIVE
SARS Coronavirus 2 by RT PCR: POSITIVE — AB

## 2022-05-09 LAB — TROPONIN I (HIGH SENSITIVITY)
Troponin I (High Sensitivity): 37 ng/L — ABNORMAL HIGH (ref <18)
Troponin I (High Sensitivity): 36 ng/L — ABNORMAL HIGH (ref <18)

## 2022-05-09 MED ORDER — SODIUM CHLORIDE 0.9 % IV BOLUS
500.0000 mL | Freq: Once | INTRAVENOUS | Status: AC
  Administered 2022-05-09: 500 mL via INTRAVENOUS

## 2022-05-09 NOTE — ED Notes (Signed)
Pulse Oximetry while ambulating  HR 85 O2 95%

## 2022-05-09 NOTE — ED Triage Notes (Signed)
Pt tested positive for covid this am. Has family that is positive . He is not in any distress, wants antiviral if possible, primary told him to get checked out because of his comorbities.

## 2022-05-09 NOTE — ED Provider Notes (Signed)
[facility] EMERGENCY DEPT Provider Note   CSN: [id number] Arrival date & time: 05/09/22  1234     History  Chief Complaint  Patient presents with   Covid Positive    Mark Jackson. is a 88 y.o. male.  Patient with a history of TAVR, atrial fibrillation on Eliquis, recent stroke with right-sided deficits, CAD with stents, hypertension, CKD presenting with body aches, generalized weakness, sore throat, headache and diarrhea for the past 3 days.  Positive COVID contacts at home and tested positive consult this morning.  States last night he had a fall due to feeling generally weak and "legs giving out underneath me.  Denies any injury from the fall.  Denies hitting his head or losing consciousness.  Denies any preceding dizziness or lightheadedness.  Denies any change in his chronic shortness of breath.  Does not know if he had a fever at home but has felt warm.  Has body aches, chills, headache, sore throat, diarrhea several times a day with nausea and poor appetite.  No vomiting.  Has had constant ongoing chest pain since his stroke in May which is unchanged. No change in his chronic leg swelling. Feels generally weak and lightheaded but denies any syncope.  Denies hitting his head when he fell yesterday.  The history is provided by the patient and a relative.       Home Medications Prior to Admission medications   Medication Sig Start Date End Date Taking? Authorizing Provider  acetaminophen (TYLENOL) 325 MG tablet Take 2 tablets (650 mg total) by mouth every 4 (four) hours as needed for mild pain (or temp > 37.5 C (99.5 F)). 01/12/22   Angiulli, Lavon Paganini, PA-C  apixaban (ELIQUIS) 2.5 MG TABS tablet Take 1 tablet (2.5 mg total) by mouth 2 (two) times daily. 01/12/22   Angiulli, Lavon Paganini, PA-C  diclofenac Sodium (VOLTAREN) 1 % GEL Apply 2 g topically 4 (four) times daily. 01/12/22   Angiulli, Lavon Paganini, PA-C  diltiazem (CARDIZEM CD) 240 MG 24 hr capsule Take 1 capsule (240  mg total) by mouth daily. 01/12/22   Angiulli, Lavon Paganini, PA-C  ezetimibe (ZETIA) 10 MG tablet Take 1 tablet (10 mg total) by mouth daily. 05/07/22   [name], MD  ferrous sulfate 325 (65 FE) MG tablet Take 325 mg by mouth daily with breakfast.    [provider]  FLUoxetine (PROZAC) 10 MG capsule Take 1 capsule (10 mg total) by mouth daily. 02/16/22 02/16/23  Jennye Boroughs, MD  furosemide (LASIX) 40 MG tablet Take 0.5 tablets (20 mg total) by mouth daily. 01/12/22   Angiulli, Lavon Paganini, PA-C  isosorbide mononitrate (IMDUR) 60 MG 24 hr tablet TAKE ONE TAB DAILY AFTER BREAKFAST. MAY TAKE AN ADDITONAL TAB IN EVENING X2DAYS IF USE NITROGLYCER 01/12/22   Angiulli, Lavon Paganini, PA-C  Multiple Vitamins-Minerals (CENTRUM SILVER ADULT 50+) TABS Take 1 tablet by mouth daily.    [provider]  nitroGLYCERIN (NITROSTAT) 0.4 MG SL tablet ONE TABLET UNDER TONGUE WHEN NEEDED FOR CHEST PAIN. MAY REPEAT IN 5 MINUTES. Patient taking differently: Place 0.4 mg under the tongue every 5 (five) minutes as needed for chest pain. 09/22/21   [name], MD  rosuvastatin (CRESTOR) 20 MG tablet TAKE (1) TABLET DAILY AT BEDTIME. 03/10/22   [name], MD  topiramate (TOPAMAX) 100 MG tablet Take 1.5 tablets (150 mg total) by mouth daily. 01/12/22   Angiulli, Lavon Paganini, PA-C      Allergies  Patient has no known allergies.    Review of Systems   Review of Systems  Constitutional:  Positive for activity change, appetite change, fatigue and fever.  HENT:  Positive for congestion.   Respiratory:  Positive for cough.   Cardiovascular:  Negative for chest pain.  Gastrointestinal:  Positive for diarrhea.  Genitourinary:  Negative for dysuria and hematuria.  Musculoskeletal:  Positive for arthralgias and myalgias.  Neurological:  Positive for weakness and headaches.   all other systems are negative except as noted in the HPI and PMH.    Physical Exam Updated Vital Signs BP (!) 168/73 (BP  Location: Left Arm)   Pulse 74   Temp 98.8 F (37.1 C)   Resp 18   SpO2 99%  Physical Exam Vitals and nursing note reviewed.  Constitutional:      General: He is not in acute distress.    Appearance: He is well-developed. He is ill-appearing.  HENT:     Head: Normocephalic and atraumatic.     Mouth/Throat:     Mouth: Mucous membranes are dry.     Pharynx: No oropharyngeal exudate.  Eyes:     Conjunctiva/sclera: Conjunctivae normal.     Pupils: Pupils are equal, round, and reactive to light.  Neck:     Comments: No meningismus. Cardiovascular:     Rate and Rhythm: Normal rate and regular rhythm.     Heart sounds: Normal heart sounds. No murmur heard. Pulmonary:     Effort: Pulmonary effort is normal. No respiratory distress.     Breath sounds: Normal breath sounds.  Chest:     Chest wall: No tenderness.  Abdominal:     Palpations: Abdomen is soft.     Tenderness: There is no abdominal tenderness. There is no guarding or rebound.  Musculoskeletal:        General: No tenderness. Normal range of motion.     Cervical back: Normal range of motion and neck supple.  Skin:    General: Skin is warm.  Neurological:     Mental Status: He is alert and oriented to person, place, and time.     Cranial Nerves: No cranial nerve deficit.     Motor: No abnormal muscle tone.     Coordination: Coordination normal.     Comments: Right-sided weakness at baseline since stroke.  5/5 strength on the left.  No facial asymmetry.  Tongue is midline  Psychiatric:        Behavior: Behavior normal.     ED Results / Procedures / Treatments   Labs (all labs ordered are listed, but only abnormal results are displayed) Labs Reviewed  RESP PANEL BY RT-PCR (FLU A&B, COVID) ARPGX2 - Abnormal; Notable for the following components:      Result Value   SARS Coronavirus 2 by RT PCR POSITIVE (*)    All other components within normal limits  CBC WITH DIFFERENTIAL/PLATELET - Abnormal; Notable for the  following components:   RBC 3.21 (*)    Hemoglobin 10.9 (*)    HCT 32.9 (*)    MCV 102.5 (*)    Platelets 124 (*)    Monocytes Absolute 2.0 (*)    All other components within normal limits  BASIC METABOLIC PANEL - Abnormal; Notable for the following components:   BUN 59 (*)    Creatinine, Ser 2.94 (*)    Calcium 8.8 (*)    GFR, Estimated 20 (*)    All other components within normal limits  TROPONIN I (HIGH  SENSITIVITY) - Abnormal; Notable for the following components:   Troponin I (High Sensitivity) 37 (*)    All other components within normal limits  TROPONIN I (HIGH SENSITIVITY) - Abnormal; Notable for the following components:   Troponin I (High Sensitivity) 36 (*)    All other components within normal limits    EKG None  Radiology CT Head Wo Contrast  Result Date: 05/09/2022 CLINICAL DATA:  Head trauma, moderate-severe. Pt tested positive for covid this am. Has family that is positive . He is not in any distress, wants antiviral if possible, primary told him to get checked out because of his comorbidities EXAM: CT HEAD WITHOUT CONTRAST TECHNIQUE: Contiguous axial images were obtained from the base of the skull through the vertex without intravenous contrast. RADIATION DOSE REDUCTION: This exam was performed according to the departmental dose-optimization program which includes automated exposure control, adjustment of the mA and/or kV according to patient size and/or use of iterative reconstruction technique. COMPARISON:  CT head 12/26/2021, MRI head 12/27/2021 BRAIN: BRAIN Cerebral ventricle sizes are concordant with the degree of cerebral volume loss. Patchy and confluent areas of decreased attenuation are noted throughout the deep and periventricular white matter of the cerebral hemispheres bilaterally, compatible with chronic microvascular ischemic disease. Chronic left cerebellar infarction. No evidence of large-territorial acute infarction. No parenchymal hemorrhage. No mass  lesion. No extra-axial collection. No mass effect or midline shift. No hydrocephalus. Basilar cisterns are patent. Redemonstration of a left middle cranial fossa arachnoid cyst. Vascular: No hyperdense vessel. Skull: No acute fracture or focal lesion. Sinuses/Orbits: Paranasal sinuses and mastoid air cells are clear. Bilateral lens replacement. Otherwise the orbits are unremarkable. Other: None. IMPRESSION: No acute intracranial abnormality. Electronically Signed   By: Iven Finn M.D.   On: 05/09/2022 17:11   DG Chest Port 1 View  Result Date: 05/09/2022 CLINICAL DATA:  COVID, shortness of breath EXAM: PORTABLE CHEST 1 VIEW COMPARISON:  12/26/2021 FINDINGS: Cardiomegaly. Both lungs are clear. The visualized skeletal structures are unremarkable. IMPRESSION: Cardiomegaly without acute abnormality of the lungs in AP portable projection. Electronically Signed   By: Delanna Ahmadi M.D.   On: 05/09/2022 16:05    Procedures Procedures    Medications Ordered in ED Medications  sodium chloride 0.9 % bolus 500 mL (has no administration in time range)    ED Course/ Medical Decision Making/ A&P                           Medical Decision Making Amount and/or Complexity of Data Reviewed Labs: ordered. Decision-making details documented in ED Course. Radiology: ordered and independent interpretation performed. Decision-making details documented in ED Course. ECG/medicine tests: ordered and independent interpretation performed. Decision-making details documented in ED Course.  3 days of generalized weakness, sore throat, headache, body aches and chills with fall last night.  No head trauma.  COVID-positive at home and here.  No hypoxia or increased work of breathing.  EKG is sinus rhythm.  X-ray shows cardiomegaly.  No infiltrate.  No hypoxia or increased work of breathing.  No chest pain.  Creatinine appears to be at baseline.  Patient able to ambulate without desaturation.  Chest x-ray is clear  without infiltrate or edema.  Troponin is flat and unchanged.  Patient has no hypoxia or increased work of breathing.  He is tolerating p.o.  He is able to ambulate with his walker which is his baseline.  He has ongoing right leg jerks but this is  an old problem for him and unchanged today.  Admission versus discharge discussed with patient and daughter.  He is adamant that he wants to go home.  States he feels strong enough and has no difficulty breathing or chest pain.  He does want to try molnupiravir.  With his medications and the kidney function he is not a candidate for Paxlovid.  Data shows that Molnupiravir has not been studied in people with GFR less than 30.  Discussed with patient and daughter.  Discussed we cannot be certain that his kidney function will not be worsened by this medication.  He is not willing to try and wishes to have supportive care at this time.  This seems reasonable.  Continue supportive care at home.  Tylenol.  No NSAIDs given his kidney function.  Follow-up with his PCP.  Quarantine precautions for 5 days.  Return to the ED with difficulty breathing, shortness of breath, chest pain, generalized weakness, unable to eat or drink or other concerns        Final Clinical Impression(s) / ED Diagnoses Final diagnoses:  COVID-19    Rx / DC Orders ED Discharge Orders     None         Laray Rivkin, Annie Main, MD 05/09/22 2317

## 2022-05-09 NOTE — ED Notes (Signed)
Discharge instructions and follow up care reviewed and explained. Pt verbalized understanding and had no further questions on d/c. Pt caox4 and ambulating with walker in care of daughter on d/c.

## 2022-05-09 NOTE — Discharge Instructions (Signed)
Keep yourself quarantined for a total of 5 days.  Your kidney function precludes the use of antiviral medications for COVID.  Keep yourself hydrated.  Use Tylenol as needed for aches and for fever.  You declined admission to the hospital today.  Return to the ED with chest pain, shortness of breath, difficulty walking, not able to eat or drink or any other concerns.

## 2022-05-09 NOTE — ED Notes (Signed)
Fluid Challenge tolerated well

## 2022-05-09 NOTE — ED Notes (Signed)
Check Pulse Oximetry while ambulating PT unable to to unsteady gate and weakness

## 2022-05-10 ENCOUNTER — Other Ambulatory Visit: Payer: Self-pay | Admitting: Physician Assistant

## 2022-05-11 ENCOUNTER — Telehealth: Payer: Self-pay | Admitting: Family Medicine

## 2022-05-11 NOTE — Telephone Encounter (Signed)
Patient requesting Tramadol refill. Last script for 5 days

## 2022-05-11 NOTE — Telephone Encounter (Signed)
FYI

## 2022-05-11 NOTE — Telephone Encounter (Signed)
Pt was seen in [facility] on 05/09/22   Patient Name: Mark Jackson Gender: Male DOB: 1929-08-26 Age: 88 Y 65 M 3 D Return Phone Number: [phone] (Primary) Address: City/ State/ Zip: [place]  [address] Client [facility] Client [facility] Provider [name]- MD Contact Type Call Who Is Calling Patient / Member / Family / Caregiver Call Type Triage / Clinical Caller Name Smitty Cords Relationship To Patient Daughter Return Phone Number [phone] (Primary) Chief Complaint Cough Reason for Call Symptomatic / Request for White states her father tested positive for covid this morning. She would like an RX for Paxlovid. Current symptoms are sore throat, cough, and diarrhea. Translation No Nurse Assessment Nurse: Vallery Sa, RN, Cathy Date/Time (Eastern Time): 05/09/2022 10:54:19 AM Confirm and document reason for call. If symptomatic, describe symptoms. ---Caller states that Mark Jackson had a positive home COVID test this morning. He developed a sore throat, cold, cough, diarrhea and fever by touch symptoms two days ago. No severe breathing difficulty or blueness around his lips. He has chest pain with coughing only (current pain rated as a 3 on the 1 to 10 scale). Alert and responsive. Does the patient have any new or worsening symptoms? ---Yes Will a triage be completed? ---Yes Related visit to physician within the last 2 weeks? ---No Does the PT have any chronic conditions? (i.e. diabetes, asthma, this includes High risk factors for pregnancy, etc.) ---Yes List chronic conditions. ---Heart problems, Stage 4 Kidney Failure, Prostate Cancer Is this a behavioral health or substance abuse call? ---No Guidelines Guideline Title Affirmed Question Affirmed Notes Nurse Date/Time ([place] Time) COVID-19 - Diagnosed or Suspected Chest pain or pressure  (Exception: MILD central chest pain, present only when coughing.) Vallery Sa, RN, Cathy 05/09/2022 10:59:21 AM Disp. Time [place] Ghazi Time) Disposition Final User 05/09/2022 11:02:54 AM Go to ED Now (or PCP triage) Yes Vallery Sa, RN, Tye [place] Final Disposition 05/09/2022 11:02:54 AM Go to ED Now (or PCP triage) Yes Vallery Sa, RN, Rosey [place] Disagree/Comply Comply Caller Understands Yes PreDisposition Call Doctor Care Advice Given Per Guideline GO TO ED NOW (OR PCP TRIAGE): * IF NO PCP (PRIMARY CARE PROVIDER) SECOND-LEVEL TRIAGE: You need to be seen within the next hour. Go to the [facility] at _____________ [place] as soon as you can. CARE ADVICE given per COVID-19 - DIAGNOSED OR SUSPECTED (Adult) guideline. * Muscle aches, headache, and other pains: Often this comes and goes with the fever. Take acetaminophen every 4 to 6 hours (Adults 650 mg) OR ibuprofen every 6 to 8 hours (Adults 400 mg). Before taking any medicine, read all the instructions on the package. * Fever, Chills, and Sweats: For fever over 101 F (38.3 C), take acetaminophen every 4 to 6 hours (Adults 650 mg) OR ibuprofen every 6 to 8 hours (Adults 400 mg). Before taking any medicine, read all the instructions on the package. Do not take aspirin unless your doctor has prescribed it for you. Chills can sometimes come before a fever. You may feel cold in your hands and feet. You may have shivering. You may also feel sweaty as your body temperature goes down. GENERAL CARE ADVICE FOR COVID-19 SYMPTOMS: * The symptoms are generally treated the same whether you have COVID-19, influenza or some other respiratory virus. * Feeling dehydrated: Drink extra liquids. If the air in your home is dry, use a humidifier. * Cough: Use cough drops. Referrals [facility] -  ED

## 2022-05-11 NOTE — Telephone Encounter (Signed)
Glad he was evaluated/received Treatment

## 2022-05-12 ENCOUNTER — Telehealth: Payer: Self-pay | Admitting: Pharmacist

## 2022-05-12 NOTE — Progress Notes (Signed)
Chronic Care Management Pharmacy Assistant   Name: Mark Jackson.  MRN: [id number] DOB: 1929-12-23   Reason for Encounter: General Adherence Call    Recent office visits:  05/07/2022 [facility] (PCP) [name], MD; he did not restart Zetia-instructed him to restart again today and recent medication   Recent consult visits:  None  Hospital visits:  05/09/2022 ED visit for COVID-19 -no medication changes due to GFR  Medications: Outpatient Encounter Medications as of 05/12/2022  Medication Sig Note   acetaminophen (TYLENOL) 325 MG tablet Take 2 tablets (650 mg total) by mouth every 4 (four) hours as needed for mild pain (or temp > 37.5 C (99.5 F)).    apixaban (ELIQUIS) 2.5 MG TABS tablet Take 1 tablet (2.5 mg total) by mouth 2 (two) times daily.    diclofenac Sodium (VOLTAREN) 1 % GEL Apply 2 g topically 4 (four) times daily.    diltiazem (CARDIZEM CD) 240 MG 24 hr capsule Take 1 capsule (240 mg total) by mouth daily.    ezetimibe (ZETIA) 10 MG tablet Take 1 tablet (10 mg total) by mouth daily.    ferrous sulfate 325 (65 FE) MG tablet Take 325 mg by mouth daily with breakfast. 01/01/2022: PTA med from prior admission carried over for CIR discharge reconciliation   FLUoxetine (PROZAC) 10 MG capsule Take 1 capsule (10 mg total) by mouth daily.    furosemide (LASIX) 40 MG tablet Take 0.5 tablets (20 mg total) by mouth daily.    isosorbide mononitrate (IMDUR) 60 MG 24 hr tablet TAKE ONE TAB DAILY AFTER BREAKFAST. MAY TAKE AN ADDITONAL TAB IN EVENING X2DAYS IF USE NITROGLYCER    Multiple Vitamins-Minerals (CENTRUM SILVER ADULT 50+) TABS Take 1 tablet by mouth daily.    nitroGLYCERIN (NITROSTAT) 0.4 MG SL tablet ONE TABLET UNDER TONGUE WHEN NEEDED FOR CHEST PAIN. MAY REPEAT IN 5 MINUTES. (Patient taking differently: Place 0.4 mg under the tongue every 5 (five) minutes as needed for chest pain.)    rosuvastatin (CRESTOR) 20 MG tablet TAKE (1) TABLET DAILY AT BEDTIME.    topiramate  (TOPAMAX) 100 MG tablet Take 1.5 tablets (150 mg total) by mouth daily.    traMADol (ULTRAM) 50 MG tablet Take 1 tablet (50 mg total) by mouth every 8 (eight) hours as needed for up to 5 days.    No facility-administered encounter medications on file as of 05/12/2022.   [facility] for General Review Call   Chart Review:  Have there been any documented new, changed, or discontinued medications since last visit? No (If yes, include name, dose, frequency, date) Has there been any documented recent hospitalizations or ED visits since last visit with Clinical Pharmacist? Yes Brief Summary: Patient went to ED on 05/09/2022 for COVID-19. He was not given any new medications.   Adherence Review:  Does the Clinical Pharmacist Assistant have access to adherence rates? Yes Adherence rates for STAR metric medications:  Rosuvastatin 20 mg last filled 01/12/2022 90 DS Adherence rates for medications indicated for disease state being reviewed (List medication(s)/day supply/ last 2 fill dates). Zetia last filled 05/07/2022 Does the patient have >5 day gap between last estimated fill dates for any of the above medications or other medication gaps? Yes Reason for medication gaps. Patient states he has not ran out of Rosuvastatin. He states he will fill this medication soon.   Disease State Questions:  Able to connect with Patient? Yes Did patient have any problems with their health  recently? Yes Note problems and Concerns: COVID-19 on 05/09/2022 Have you had any admissions or emergency room visits or worsening of your condition(s) since last visit? Yes Details of ED visit, hospital visit and/or worsening condition(s): Patient went to ED on 05/09/2022 for COVID-19. No new medications given due to GFR. Have you had any visits with new specialists or providers since your last visit? No Have you had any new health care problem(s) since your last visit? No Have you run out of any of your  medications since you last spoke with clinical pharmacist? No Are there any medications you are not taking as prescribed? No Are you having any issues or side effects with your medications? No Do you have any other health concerns or questions you want to discuss with your Clinical Pharmacist before your next visit? No Are there any health concerns that you feel we can do a better job addressing? No Are you having any problems with any of the following since the last visit:   None 12. Any falls since last visit? No 13. Any increased or uncontrolled pain since last visit? No   Care Gaps: Medicare Annual Wellness: Completed 11/28/2021 Hemoglobin A1C: 5.8% on 12/27/2021 Colonoscopy: Completed 09/17/2021  Future Appointments  Date Time Provider [place]  05/19/2022 10:45 AM Kirsteins, Luanna Salk, MD [facility] CPR  07/16/2022 11:00 AM [name], MD [facility] PEC  07/21/2022  1:50 PM Pieter Partridge, DO [facility] None  09/04/2022 10:30 AM [facility] CCM PHARMACIST [facility] PEC  09/11/2022  3:00 PM [facility] None  09/11/2022  3:30 PM Wyatt Portela, MD [facility] None  12/11/2022  1:00 PM [facility] HEALTH COACH [facility]EC   Star Rating Drugs: Rosuvastatin 20 mg last filled 01/12/2022 90 DS  April D Calhoun, [place] Pharmacist Assistant [phone]

## 2022-05-14 ENCOUNTER — Other Ambulatory Visit: Payer: Self-pay

## 2022-05-14 ENCOUNTER — Telehealth: Payer: Self-pay

## 2022-05-14 MED ORDER — DILTIAZEM HCL ER COATED BEADS 240 MG PO CP24: 240.0000 mg | ORAL_CAPSULE | Freq: Every day | ORAL | 3 refills | Status: DC

## 2022-05-14 NOTE — Telephone Encounter (Signed)
     Patient  visit on 9/30  at [facility]  Have you been able to follow up with your primary care physician? yes  The patient was or was not able to obtain any needed medicine or equipment.yes  Are there diet recommendations that you are having difficulty following?na  Patient expresses understanding of discharge instructions and education provided has no other needs at this time. yes     [place], Care Management  [phone] [address], [place], [place] [address] Phone: [phone] Email: [email or web address]

## 2022-05-14 NOTE — Telephone Encounter (Signed)
Pharmacy is calling to request a refill on Diltiazem. It has not been filled since June

## 2022-05-19 ENCOUNTER — Encounter: Payer: Medicare Other | Attending: Physical Medicine & Rehabilitation | Admitting: Physical Medicine & Rehabilitation

## 2022-05-19 ENCOUNTER — Encounter: Payer: Self-pay | Admitting: Physical Medicine & Rehabilitation

## 2022-05-19 ENCOUNTER — Other Ambulatory Visit: Payer: Self-pay | Admitting: Family Medicine

## 2022-05-19 DIAGNOSIS — I6521 Occlusion and stenosis of right carotid artery: Secondary | ICD-10-CM | POA: Diagnosis not present

## 2022-05-19 DIAGNOSIS — R269 Unspecified abnormalities of gait and mobility: Secondary | ICD-10-CM | POA: Diagnosis not present

## 2022-05-19 DIAGNOSIS — I69398 Other sequelae of cerebral infarction: Secondary | ICD-10-CM | POA: Diagnosis not present

## 2022-05-19 DIAGNOSIS — I6381 Other cerebral infarction due to occlusion or stenosis of small artery: Secondary | ICD-10-CM
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
## 2022-05-19 NOTE — Patient Instructions (Signed)
Please see eye doctor to check vision  Discuss return to work and independence at home at next visit   Agree with personal trainer

## 2022-06-01 ENCOUNTER — Other Ambulatory Visit: Payer: Self-pay | Admitting: Physical Medicine & Rehabilitation

## 2022-06-14 ENCOUNTER — Emergency Department (HOSPITAL_COMMUNITY): Payer: Medicare Other

## 2022-06-14 ENCOUNTER — Encounter (HOSPITAL_COMMUNITY): Payer: Self-pay | Admitting: Emergency Medicine

## 2022-06-14 ENCOUNTER — Emergency Department (HOSPITAL_COMMUNITY)
Admission: EM | Admit: 2022-06-14 | Discharge: 2022-06-14 | Disposition: A | Discharge status: Home / Self Care | Payer: Medicare Other | Attending: Emergency Medicine | Admitting: Emergency Medicine

## 2022-06-14 DIAGNOSIS — S20211A Contusion of right front wall of thorax, initial encounter: Secondary | ICD-10-CM | POA: Diagnosis not present

## 2022-06-14 DIAGNOSIS — R600 Localized edema: Secondary | ICD-10-CM | POA: Insufficient documentation

## 2022-06-14 DIAGNOSIS — M542 Cervicalgia: Secondary | ICD-10-CM | POA: Diagnosis not present

## 2022-06-14 DIAGNOSIS — R7989 Other specified abnormal findings of blood chemistry: Secondary | ICD-10-CM | POA: Insufficient documentation

## 2022-06-14 DIAGNOSIS — R079 Chest pain, unspecified: Secondary | ICD-10-CM | POA: Diagnosis not present

## 2022-06-14 DIAGNOSIS — I10 Essential (primary) hypertension: Secondary | ICD-10-CM | POA: Diagnosis not present

## 2022-06-14 DIAGNOSIS — R519 Headache, unspecified: Secondary | ICD-10-CM | POA: Diagnosis not present

## 2022-06-14 DIAGNOSIS — D72829 Elevated white blood cell count, unspecified: Secondary | ICD-10-CM | POA: Insufficient documentation

## 2022-06-14 DIAGNOSIS — I2510 Atherosclerotic heart disease of native coronary artery without angina pectoris: Secondary | ICD-10-CM | POA: Diagnosis not present

## 2022-06-14 DIAGNOSIS — Z7901 Long term (current) use of anticoagulants: Secondary | ICD-10-CM | POA: Insufficient documentation

## 2022-06-14 DIAGNOSIS — I4891 Unspecified atrial fibrillation: Secondary | ICD-10-CM | POA: Diagnosis not present

## 2022-06-14 DIAGNOSIS — N189 Chronic kidney disease, unspecified: Secondary | ICD-10-CM | POA: Diagnosis not present

## 2022-06-14 DIAGNOSIS — R55 Syncope and collapse: Secondary | ICD-10-CM | POA: Diagnosis not present

## 2022-06-14 DIAGNOSIS — W19XXXA Unspecified fall, initial encounter: Secondary | ICD-10-CM | POA: Diagnosis not present

## 2022-06-14 DIAGNOSIS — S20301A Unspecified superficial injuries of right front wall of thorax, initial encounter: Secondary | ICD-10-CM | POA: Diagnosis present

## 2022-06-14 LAB — CBC WITH DIFFERENTIAL/PLATELET
Abs Immature Granulocytes: 0.17 10*3/uL — ABNORMAL HIGH (ref 0.00–0.07)
Basophils Absolute: 0.1 10*3/uL (ref 0.0–0.1)
Basophils Relative: 1 %
Eosinophils Absolute: 0.1 10*3/uL (ref 0.0–0.5)
Eosinophils Relative: 1 %
HCT: 34.6 % — ABNORMAL LOW (ref 39.0–52.0)
Hemoglobin: 11.4 g/dL — ABNORMAL LOW (ref 13.0–17.0)
Immature Granulocytes: 1 %
Lymphocytes Relative: 8 %
Lymphs Abs: 1.2 10*3/uL (ref 0.7–4.0)
MCH: 34.2 pg — ABNORMAL HIGH (ref 26.0–34.0)
MCHC: 32.9 g/dL (ref 30.0–36.0)
MCV: 103.9 fL — ABNORMAL HIGH (ref 80.0–100.0)
Monocytes Absolute: 0.8 10*3/uL (ref 0.1–1.0)
Monocytes Relative: 6 %
Neutro Abs: 12.2 10*3/uL — ABNORMAL HIGH (ref 1.7–7.7)
Neutrophils Relative %: 83 %
Platelets: 128 10*3/uL — ABNORMAL LOW (ref 150–400)
RBC: 3.33 MIL/uL — ABNORMAL LOW (ref 4.22–5.81)
RDW: 13.3 % (ref 11.5–15.5)
WBC: 14.6 10*3/uL — ABNORMAL HIGH (ref 4.0–10.5)
nRBC: 0 % (ref 0.0–0.2)

## 2022-06-14 LAB — COMPREHENSIVE METABOLIC PANEL
ALT: 15 U/L (ref 0–44)
AST: 24 U/L (ref 15–41)
Albumin: 3.5 g/dL (ref 3.5–5.0)
Alkaline Phosphatase: 59 U/L (ref 38–126)
Anion gap: 14 (ref 5–15)
BUN: 52 mg/dL — ABNORMAL HIGH (ref 8–23)
CO2: 18 mmol/L — ABNORMAL LOW (ref 22–32)
Calcium: 9.3 mg/dL (ref 8.9–10.3)
Chloride: 111 mmol/L (ref 98–111)
Creatinine, Ser: 2.71 mg/dL — ABNORMAL HIGH (ref 0.61–1.24)
GFR, Estimated: 22 mL/min — ABNORMAL LOW (ref >60)
Glucose, Bld: 121 mg/dL — ABNORMAL HIGH (ref 70–99)
Potassium: 4.5 mmol/L (ref 3.5–5.1)
Sodium: 143 mmol/L (ref 135–145)
Total Bilirubin: 0.6 mg/dL (ref 0.3–1.2)
Total Protein: 6.6 g/dL (ref 6.5–8.1)

## 2022-06-14 LAB — BRAIN NATRIURETIC PEPTIDE: B Natriuretic Peptide: 255.6 pg/mL — ABNORMAL HIGH (ref 0.0–100.0)

## 2022-06-14 LAB — TROPONIN I (HIGH SENSITIVITY)
Troponin I (High Sensitivity): 51 ng/L — ABNORMAL HIGH (ref <18)
Troponin I (High Sensitivity): 42 ng/L — ABNORMAL HIGH (ref <18)

## 2022-06-14 MED ORDER — ACETAMINOPHEN 500 MG PO TABS
1000.0000 mg | ORAL_TABLET | Freq: Once | ORAL | Status: AC
  Administered 2022-06-14: 1000 mg via ORAL
  Filled 2022-06-14: qty 2

## 2022-06-14 MED ORDER — HYDROCODONE-ACETAMINOPHEN 5-325 MG PO TABS: 2.0000 | ORAL_TABLET | ORAL | 0 refills | Status: DC | PRN

## 2022-06-14 MED ORDER — FUROSEMIDE 20 MG PO TABS
20.0000 mg | ORAL_TABLET | Freq: Every day | ORAL | Status: DC
  Administered 2022-06-14: 20 mg via ORAL
  Filled 2022-06-14: qty 1

## 2022-06-14 MED ORDER — DILTIAZEM HCL ER COATED BEADS 240 MG PO CP24
240.0000 mg | ORAL_CAPSULE | Freq: Every day | ORAL | Status: DC
  Administered 2022-06-14: 240 mg via ORAL
  Filled 2022-06-14: qty 1

## 2022-06-14 MED ORDER — HYDROCODONE-ACETAMINOPHEN 5-325 MG PO TABS
1.0000 | ORAL_TABLET | Freq: Once | ORAL | Status: AC
  Administered 2022-06-14: 1 via ORAL
  Filled 2022-06-14: qty 1

## 2022-06-14 NOTE — Progress Notes (Signed)
Orthopedic Tech Progress Note Patient Details:  Mark Jackson 1930-06-14 [id number]  Patient ID: Mark Harris., male   DOB: July 01, 1930, 88 y.o.   MRN: [id number] Level II; not currently needed. Mark Jackson 06/14/2022, 9:47 AM

## 2022-06-14 NOTE — ED Notes (Signed)
Patient transported to X-ray 

## 2022-06-14 NOTE — ED Provider Notes (Signed)
[facility] EMERGENCY DEPARTMENT Provider Note   CSN: [id number] Arrival date & time: 06/14/22  0935     History  Chief Complaint  Patient presents with   level 2 fall    Mark Jackson. is a 88 y.o. male.  Pt is a 88y/o male with hx of TAVR, atrial fibrillation on Eliquis, recent stroke with right-sided deficits in may, CAD with stents, hypertension, CKD with COVID at the end of sept who is presenting today as a level 2 trauma after being a fall on thinners.  Patient reports that he had just gotten up and he stood up to go to the bathroom and the next thing he remembers is waking up on the floor.  He reports passing out which he reports is not new.  He reports that almost weekly he is having episodes of passing out but he has never injured himself before.  He denied having any chest pain or discomfort prior to the fall.  He does report that he will occasionally feel a little bit lightheaded before passing out.  This is the first time he is injured himself.  He reports always this is of been happening since his hospitalization in May.  Today when he woke up he had pain in the right side of his chest which is worse when he takes a deep breath.  He is continue taking his current medications.  He denies any nausea vomiting, headache, abdominal pain or extremity pain.  The history is provided by the patient and the EMS personnel.  Fall       Home Medications Prior to Admission medications   Medication Sig Start Date End Date Taking? Authorizing Provider  acetaminophen (TYLENOL) 325 MG tablet Take 2 tablets (650 mg total) by mouth every 4 (four) hours as needed for mild pain (or temp > 37.5 C (99.5 F)). 01/12/22   Angiulli, Lavon Paganini, PA-C  amoxicillin (AMOXIL) 500 MG capsule For pre dental procedures 05/11/22   [provider]  apixaban (ELIQUIS) 2.5 MG TABS tablet Take 1 tablet (2.5 mg total) by mouth 2 (two) times daily. 01/12/22   Angiulli, Lavon Paganini, PA-C   diclofenac Sodium (VOLTAREN) 1 % GEL Apply 2 g topically 4 (four) times daily. 01/12/22   Angiulli, Lavon Paganini, PA-C  diltiazem (CARDIZEM CD) 240 MG 24 hr capsule Take 1 capsule (240 mg total) by mouth daily. 05/14/22   [name], MD  ezetimibe (ZETIA) 10 MG tablet Take 1 tablet (10 mg total) by mouth daily. 05/07/22   [name], MD  ferrous sulfate 325 (65 FE) MG tablet Take 325 mg by mouth daily with breakfast.    [provider]  FLUoxetine (PROZAC) 10 MG capsule Take 1 capsule (10 mg total) by mouth daily. 06/01/22 06/01/23  Jennye Boroughs, MD  furosemide (LASIX) 40 MG tablet Take 0.5 tablets (20 mg total) by mouth daily. 05/20/22   [name], MD  isosorbide mononitrate (IMDUR) 60 MG 24 hr tablet TAKE ONE TAB DAILY AFTER BREAKFAST. MAY TAKE AN ADDITONAL TAB IN EVENING X2DAYS IF USE NITROGLYCER 01/12/22   Angiulli, Lavon Paganini, PA-C  Multiple Vitamins-Minerals (CENTRUM SILVER ADULT 50+) TABS Take 1 tablet by mouth daily.    [provider]  nitroGLYCERIN (NITROSTAT) 0.4 MG SL tablet ONE TABLET UNDER TONGUE WHEN NEEDED FOR CHEST PAIN. MAY REPEAT IN 5 MINUTES. Patient taking differently: Place 0.4 mg under the tongue every 5 (five) minutes as needed for chest pain. 09/22/21  [name], MD  rosuvastatin (CRESTOR) 20 MG tablet TAKE (1) TABLET DAILY AT BEDTIME. 03/10/22   [name], MD  topiramate (TOPAMAX) 100 MG tablet Take 1.5 tablets (150 mg total) by mouth daily. 01/12/22   Angiulli, Lavon Paganini, PA-C  traMADol (ULTRAM) 50 MG tablet Take 50 mg by mouth 3 (three) times daily as needed.    [name], MD      Allergies    Patient has no known allergies.    Review of Systems   Review of Systems  Physical Exam Updated Vital Signs BP (!) 173/84   Pulse 92   Temp (!) 97.4 F (36.3 C) (Oral)   Resp 16   Ht '5\' 8"'$  (1.727 m)   Wt 87.8 kg   SpO2 97%   BMI 29.43 kg/m  Physical Exam Vitals and nursing note reviewed.  Constitutional:       General: He is not in acute distress.    Appearance: He is well-developed.  HENT:     Head: Normocephalic and atraumatic.   Eyes:     Conjunctiva/sclera: Conjunctivae normal.     Pupils: Pupils are equal, round, and reactive to light.  Cardiovascular:     Rate and Rhythm: Normal rate and regular rhythm.     Pulses: Normal pulses.     Heart sounds: No murmur heard. Pulmonary:     Effort: Pulmonary effort is normal. No respiratory distress.     Breath sounds: Normal breath sounds. No wheezing or rales.  Abdominal:     General: There is no distension.     Palpations: Abdomen is soft.     Tenderness: There is no abdominal tenderness. There is no guarding or rebound.  Musculoskeletal:        General: No tenderness. Normal range of motion.     Cervical back: Normal range of motion and neck supple.     Right lower leg: Edema present.     Left lower leg: Edema present.     Comments: 1+ pitting edema to bilateral ankles  Skin:    General: Skin is warm and dry.     Findings: No erythema or rash.  Neurological:     Mental Status: He is alert. Mental status is at baseline.     Sensory: No sensory deficit.     Motor: No weakness.     Comments: Oriented to person and place  Psychiatric:        Mood and Affect: Mood normal.        Behavior: Behavior normal.     ED Results / Procedures / Treatments   Labs (all labs ordered are listed, but only abnormal results are displayed) Labs Reviewed  CBC WITH DIFFERENTIAL/PLATELET - Abnormal; Notable for the following components:      Result Value   WBC 14.6 (*)    RBC 3.33 (*)    Hemoglobin 11.4 (*)    HCT 34.6 (*)    MCV 103.9 (*)    MCH 34.2 (*)    Platelets 128 (*)    Neutro Abs 12.2 (*)    Abs Immature Granulocytes 0.17 (*)    All other components within normal limits  COMPREHENSIVE METABOLIC PANEL - Abnormal; Notable for the following components:   CO2 18 (*)    Glucose, Bld 121 (*)    BUN 52 (*)    Creatinine, Ser 2.71 (*)     GFR, Estimated 22 (*)    All other components within normal limits  BRAIN NATRIURETIC PEPTIDE - Abnormal; Notable for the following components:   B Natriuretic Peptide 255.6 (*)    All other components within normal limits  TROPONIN I (HIGH SENSITIVITY) - Abnormal; Notable for the following components:   Troponin I (High Sensitivity) 51 (*)    All other components within normal limits  TROPONIN I (HIGH SENSITIVITY)    EKG EKG Interpretation  Date/Time:  Sunday June 14 2022 09:44:21 EST Ventricular Rate:  83 PR Interval:  214 QRS Duration: 103 QT Interval:  423 QTC Calculation: 498 R Axis:   -39 Text Interpretation: Sinus rhythm Borderline prolonged PR interval Left axis deviation Borderline T wave abnormalities Borderline prolonged QT interval No significant change since last tracing Confirmed by Mark Jackson [phone]) on 06/14/2022 10:31:20 AM  Radiology CT Head Wo Contrast  Result Date: 06/14/2022 CLINICAL DATA:  88 year old male with head and neck pain following fall. On blood thinners. EXAM: CT HEAD WITHOUT CONTRAST CT CERVICAL SPINE WITHOUT CONTRAST TECHNIQUE: Multidetector CT imaging of the head and cervical spine was performed following the standard protocol without intravenous contrast. Multiplanar CT image reconstructions of the cervical spine were also generated. RADIATION DOSE REDUCTION: This exam was performed according to the departmental dose-optimization program which includes automated exposure control, adjustment of the mA and/or kV according to patient size and/or use of iterative reconstruction technique. COMPARISON:  05/09/2022 CT and prior studies FINDINGS: CT HEAD FINDINGS Brain: No acute abnormality identified. No evidence of acute infarction, hemorrhage or hydrocephalus. A LEFT middle cranial fossa arachnoid cyst is unchanged. Atrophy, remote LEFT cerebellar and thalamic infarcts and chronic small-vessel white matter ischemic changes again noted. Vascular:  Carotid and vertebral atherosclerotic calcifications are noted. Skull: Normal. Negative for fracture or focal lesion. Sinuses/Orbits: No acute finding. Other: None. CT CERVICAL SPINE FINDINGS Alignment: Normal. Skull base and vertebrae: No acute fracture. No primary bone lesion or focal pathologic process. Soft tissues and spinal canal: No prevertebral fluid or swelling. No visible canal hematoma. Disc levels: Multilevel degenerative disc disease and spondylosis identified contributing to mild bony foraminal narrowing at C3-4 and C4-5. Upper chest: No acute abnormality Other: None IMPRESSION: 1. No evidence of acute intracranial abnormality. Atrophy, chronic small-vessel white matter ischemic changes, LEFT middle cranial fossa arachnoid cyst and remote infarcts as described. 2. No evidence of acute injury to the cervical spine. Degenerative changes as described. Electronically Signed   By: Margarette Canada M.D.   On: 06/14/2022 11:17   CT Cervical Spine Wo Contrast  Result Date: 06/14/2022 CLINICAL DATA:  88 year old male with head and neck pain following fall. On blood thinners. EXAM: CT HEAD WITHOUT CONTRAST CT CERVICAL SPINE WITHOUT CONTRAST TECHNIQUE: Multidetector CT imaging of the head and cervical spine was performed following the standard protocol without intravenous contrast. Multiplanar CT image reconstructions of the cervical spine were also generated. RADIATION DOSE REDUCTION: This exam was performed according to the departmental dose-optimization program which includes automated exposure control, adjustment of the mA and/or kV according to patient size and/or use of iterative reconstruction technique. COMPARISON:  05/09/2022 CT and prior studies FINDINGS: CT HEAD FINDINGS Brain: No acute abnormality identified. No evidence of acute infarction, hemorrhage or hydrocephalus. A LEFT middle cranial fossa arachnoid cyst is unchanged. Atrophy, remote LEFT cerebellar and thalamic infarcts and chronic  small-vessel white matter ischemic changes again noted. Vascular: Carotid and vertebral atherosclerotic calcifications are noted. Skull: Normal. Negative for fracture or focal lesion. Sinuses/Orbits: No acute finding. Other: None. CT CERVICAL SPINE FINDINGS Alignment: Normal. Skull base and vertebrae:  No acute fracture. No primary bone lesion or focal pathologic process. Soft tissues and spinal canal: No prevertebral fluid or swelling. No visible canal hematoma. Disc levels: Multilevel degenerative disc disease and spondylosis identified contributing to mild bony foraminal narrowing at C3-4 and C4-5. Upper chest: No acute abnormality Other: None IMPRESSION: 1. No evidence of acute intracranial abnormality. Atrophy, chronic small-vessel white matter ischemic changes, LEFT middle cranial fossa arachnoid cyst and remote infarcts as described. 2. No evidence of acute injury to the cervical spine. Degenerative changes as described. Electronically Signed   By: Margarette Canada M.D.   On: 06/14/2022 11:17   DG Chest 2 View  Result Date: 06/14/2022 CLINICAL DATA:  Chest pain, syncope EXAM: CHEST - 2 VIEW COMPARISON:  05/09/2022 FINDINGS: Stable heart size status post TAVR. No focal airspace consolidation, pleural effusion, or pneumothorax. No acute bony findings. IMPRESSION: No active cardiopulmonary disease. Electronically Signed   By: Davina Poke D.O.   On: 06/14/2022 10:31    Procedures Procedures    Medications Ordered in ED Medications  diltiazem (CARDIZEM CD) 24 hr capsule 240 mg (240 mg Oral Given 06/14/22 1053)  furosemide (LASIX) tablet 20 mg (20 mg Oral Given 06/14/22 1053)  HYDROcodone-acetaminophen (NORCO/VICODIN) 5-325 MG per tablet 1 tablet (has no administration in time range)  acetaminophen (TYLENOL) tablet 1,000 mg (1,000 mg Oral Given 06/14/22 1229)    ED Course/ Medical Decision Making/ A&P                           Medical Decision Making Amount and/or Complexity of Data  Reviewed External Data Reviewed: notes.    Details: Pcp office visit  Labs: ordered. Decision-making details documented in ED Course. Radiology: ordered and independent interpretation performed. Decision-making details documented in ED Course. ECG/medicine tests: ordered and independent interpretation performed. Decision-making details documented in ED Course.  Risk OTC drugs. Prescription drug management.   Pt with multiple medical problems and comorbidities and presenting today with a complaint that caries a high risk for morbidity and mortality.  Here today as a level 2 trauma after a fall at home and head injury on Eliquis.  It seems that patient had a syncopal event.  He does not remember the fall or hitting the floor but remembers waking up.  He did report some minimal dizziness prior to this.  Patient reports he has been having syncope regularly since his most recent hospitalization however when evaluating his outside medical records, doctors office records and cardiology records nobody mentions syncope.  Patient has not worn any monitors.  He does report there is someone with him in his home regularly and there was someone there today we will get collateral information.  On exam patient has a small laceration under the eye but is mentating normally.  He is also complaining of pain in the right ribs.  Concern for possible rib fracture.  Concern for anemia, orthostatic hypotension, dysrhythmia, possible poor EF and CHF is the cause of his syncope.  Patient does not have a pacemaker.  Labs and imaging are pending.  Wound repaired with Dermabond.   I independently interpreted patient's labs and EKG.  EKG without acute findings today, no evidence of prolonged QT, dysrhythmia or concerns for Brugada's.  CBC with leukocytosis of 14 today, hemoglobin stable at 11, platelet count of 128.  CMP today with creatinine of 2.71 which is improved from prior of 2.9-3, normal anion gap and electrolytes, BNP is  elevated today  at 255 from in the 100s and troponin is mildly elevated at 51 from the 20s in the past.  I have independently visualized and interpreted pt's images today.  CT head and cervical spine are negative for intracranial hemorrhage or acute fracture.  Radiology does note prior strokes.  Patient's chest x-ray without evidence of fracture.  C-spine was cleared.   Findings discussed with the patient and his son who is now at bedside.  Patient's son reports that he is unaware of the patient ever having syncope in the past.  He has never been evaluated by a doctor for this.  He also talked with his sisters who also are unaware of any of this.  They see their dad regularly and he has a caregiver and nobody is ever mention syncope.  All of patient's labs appear to be at their baseline except for the leukocytosis which may be from the fall today.  He is having significant pain in his ribs and was given Tylenol.  We will have to ensure that patient is able to stand and walk and does not have recurrent dizziness.  At this time low suspicion for PE as patient is anticoagulated.  He does display some fluid overload but his son reports that that has been baseline for him most recently.  Based on mobility and recurrence of symptoms patient will either need admission for syncope work-up versus discharge with outpatient follow-up.  3:42 PM Orthostatics are negative.  Patient's blood pressure has since improved some with home dose of medication.  Will ensure patient is able to ambulate.  3:42 PM Patient was able to stand and ambulate and denies feeling lightheaded or dizzy.  He is having a lot of pain in his right chest and may still have nondisplaced rib fracture.  Will give further pain control.  Patient is not a candidate for NSAIDs due to his renal function and anticoagulation.  Will ensure repeat troponin is flat.  Otherwise discussed admission versus going home with the patient and he wishes to go home.  His son  is present at bedside and is comfortable with this plan.  He wishes to follow-up as an outpatient with Dr. [name] and his cardiologist.         Final Clinical Impression(s) / ED Diagnoses Final diagnoses:  Chest wall contusion, right, initial encounter  Syncope, unspecified syncope type    Rx / DC Orders ED Discharge Orders     None         Mark Dessert, MD 06/14/22 1544

## 2022-06-14 NOTE — Discharge Instructions (Addendum)
All your blood work today looks about the same as it normally does.  Your x-ray did not show a rib fracture but it is possible that you cracked 1 and it just did not show up on the x-ray.  You will want to get some over-the-counter lidocaine patches that you can put on the area where it hurts and you were also given a prescription for some pain medication that you can take over the next few days to help with the pain.  It is very important that you follow-up with your cardiologist and Dr. [name] about these episodes of feeling faint.  If you have any further fainting, development of shortness of breath, chest pain you should return to the emergency room.  The next 2 days you do have a lot of discomfort from your fall and that is pretty normal.

## 2022-06-14 NOTE — ED Provider Notes (Signed)
Signout from Dr. Maryan Rued.  88 year old male here after a fall possible syncope complaining of some pain in the right side of his chest possible rib injury.  He had a CT of his head and chest and chest x-ray that did not show any definite signs of injury.  His troponin was mildly elevated and he is pending a second troponin.  If his troponin is negative he would like to be discharged and family is in agreement. Physical Exam  BP (!) 173/84   Pulse 92   Temp (!) 97.4 F (36.3 C) (Oral)   Resp 16   Ht '5\' 8"'$  (1.727 m)   Wt 87.8 kg   SpO2 97%   BMI 29.43 kg/m   Physical Exam  Procedures  Procedures  ED Course / MDM    Medical Decision Making Amount and/or Complexity of Data Reviewed Labs: ordered. Radiology: ordered.  Risk OTC drugs. Prescription drug management.   Second troponin back and lower than prior.  Patient is still wishing to be discharged and son is in agreement with him.  They will follow-up with PCP tomorrow.  Return instructions discussed       Hayden Rasmussen, MD 06/15/22 [phone]

## 2022-06-14 NOTE — ED Triage Notes (Signed)
Pt arrives via EMS from home where he fell when going to the bathroom. Bruise to right side/rib cage, skin tear to left FA, bruise to abd. Pt reports frequent syncopal episodes. Pt took ASA and 2 nitro at home due to CP after the fall.

## 2022-06-15 ENCOUNTER — Telehealth (INDEPENDENT_AMBULATORY_CARE_PROVIDER_SITE_OTHER): Payer: Medicare Other | Admitting: Family Medicine

## 2022-06-15 ENCOUNTER — Encounter: Payer: Self-pay | Admitting: Family Medicine

## 2022-06-15 VITALS — Ht 68.0 in

## 2022-06-15 DIAGNOSIS — S2231XA Fracture of one rib, right side, initial encounter for closed fracture: Secondary | ICD-10-CM

## 2022-06-15 DIAGNOSIS — I6521 Occlusion and stenosis of right carotid artery: Secondary | ICD-10-CM

## 2022-06-15 NOTE — Progress Notes (Signed)
Phone [phone] Virtual visit via Video note   Subjective:  Chief complaint: Chief Complaint  Patient presents with   Follow-up    Pt is f/u on potential rib fracture xray yesterday didn't show any breaks but is in a lot of pain, pt is taking vicodin and tramadol but no relief.    This visit type was conducted due to national recommendations for restrictions regarding the COVID-19 Pandemic (e.g. social distancing).  This format is felt to be most appropriate for this patient at this time balancing risks to patient and risks to population by having him in for in person visit.  No physical exam was performed (except for noted visual exam or audio findings with Telehealth visits).    Our team/I connected with [name] at  3:20 PM EST by a video enabled telemedicine application (doxy.me or caregility through epic) and verified that I am speaking with the correct person using two identifiers.  Location patient: [facility] Location provider: [facility], office Persons participating in the virtual visit:  patient  Our team/I discussed the limitations of evaluation and management by telemedicine and the availability of in person appointments. In light of current covid-19 pandemic, patient also understands that we are trying to protect them by minimizing in office contact if at all possible.  The patient expressed consent for telemedicine visit and agreed to proceed. Patient understands insurance will be billed.   Past Medical History-  Patient Active Problem List   Diagnosis Date Noted   Right hemiparesis ([place]) 03/05/2022    Priority: High   Chronic diastolic heart failure ([place]) 12/12/2019    Priority: High   S/P TAVR (transcatheter aortic valve replacement) 12/12/2019    Priority: High   CAD S/P DES PCI to proximal LAD 03/22/2015    Priority: High   Paroxysmal atrial fibrillation ([place]); CHA2DSVasc - 4; Now on Eliquis 08/20/2014    Priority: High    Class: Diagnosis of    Chronic kidney disease (CKD), active medical management without dialysis, stage 4 (severe) ([place]) 08/20/2014    Priority: High   Personal history of prostate cancer 10/22/2008    Priority: High   Hyperglycemia 09/27/2017    Priority: Medium    B12 deficiency 01/06/2017    Priority: Medium    BPH associated with nocturia 06/15/2016    Priority: Medium    Hereditary and idiopathic peripheral neuropathy 01/12/2014    Priority: Medium    H/O syncope 09/03/2013    Priority: Medium    Right-sided carotid artery disease; followed by Dr. Trula Slade 03/02/2013    Priority: Medium    Hyperlipidemia with target LDL less than 70 03/02/2013    Priority: Medium    Migraine without aura 10/26/2012    Priority: Medium    Anemia 10/23/2008    Priority: Medium    Essential hypertension 10/22/2008    Priority: Medium    Excessive daytime sleepiness 10/22/2008    Priority: Medium    Thrombocytopenia, unspecified ([place]) 12/11/2021    Priority: Low   Syncope and collapse 11/27/2019    Priority: Low   Fatigue 11/08/2017    Priority: Low   Chronic diarrhea 01/06/2017    Priority: Low   Perianal dermatitis 06/19/2015    Priority: Low   Rectal bleeding 04/25/2015    Priority: Low   Long term current use of anticoagulant therapy 08/27/2014    Priority: Low   GLAUCOMA 10/23/2008    Priority: Low   Arthropathy 10/22/2008    Priority: Low  History of colonic polyps 10/22/2008    Priority: Low   Situational depression 03/05/2022   Loud snoring 02/04/2022   Hematuria 12/11/2020   Bilateral lower extremity edema 04/15/2017    Medications- reviewed and updated Current Outpatient Medications  Medication Sig Dispense Refill   acetaminophen (TYLENOL) 325 MG tablet Take 2 tablets (650 mg total) by mouth every 4 (four) hours as needed for mild pain (or temp > 37.5 C (99.5 F)).     amoxicillin (AMOXIL) 500 MG capsule For pre dental procedures     apixaban (ELIQUIS) 2.5 MG TABS tablet Take 1 tablet (2.5  mg total) by mouth 2 (two) times daily. 180 tablet 1   diclofenac Sodium (VOLTAREN) 1 % GEL Apply 2 g topically 4 (four) times daily. 2 g 0   diltiazem (CARDIZEM CD) 240 MG 24 hr capsule Take 1 capsule (240 mg total) by mouth daily. 90 capsule 3   ezetimibe (ZETIA) 10 MG tablet Take 1 tablet (10 mg total) by mouth daily. 90 tablet 3   ferrous sulfate 325 (65 FE) MG tablet Take 325 mg by mouth daily with breakfast.     FLUoxetine (PROZAC) 10 MG capsule Take 1 capsule (10 mg total) by mouth daily. 30 capsule 2   furosemide (LASIX) 40 MG tablet Take 0.5 tablets (20 mg total) by mouth daily. 30 tablet 0   HYDROcodone-acetaminophen (NORCO/VICODIN) 5-325 MG tablet Take 2 tablets by mouth every 4 (four) hours as needed. 10 tablet 0   isosorbide mononitrate (IMDUR) 60 MG 24 hr tablet TAKE ONE TAB DAILY AFTER BREAKFAST. MAY TAKE AN ADDITONAL TAB IN EVENING X2DAYS IF USE NITROGLYCER 120 tablet 3   Multiple Vitamins-Minerals (CENTRUM SILVER ADULT 50+) TABS Take 1 tablet by mouth daily.     nitroGLYCERIN (NITROSTAT) 0.4 MG SL tablet ONE TABLET UNDER TONGUE WHEN NEEDED FOR CHEST PAIN. MAY REPEAT IN 5 MINUTES. (Patient taking differently: Place 0.4 mg under the tongue every 5 (five) minutes as needed for chest pain.) 25 tablet 0   rosuvastatin (CRESTOR) 20 MG tablet TAKE (1) TABLET DAILY AT BEDTIME. 90 tablet 1   topiramate (TOPAMAX) 100 MG tablet Take 1.5 tablets (150 mg total) by mouth daily. 135 tablet 3   traMADol (ULTRAM) 50 MG tablet Take 50 mg by mouth 3 (three) times daily as needed.     No current facility-administered medications for this visit.     Objective:  Ht '5\' 8"'$  (1.727 m)   BMI 29.43 kg/m  self reported vitals Gen: NAD, resting  in his chair Lungs: nonlabored, normal respiratory rate  Skin: appears dry, no obvious rash     Assessment and Plan   #ED follow-up S: Patient had COVID in late September.  Also with history of stroke leading to residual balance disorder that likely  predisposed him to fall particularly with weakness associated with COVID-19.  Was recovering and then yesterday had a fall possibly with related syncope who presented with right-sided chest pain.  CT of head (due to being on Eliquis) and cervical spine and chest x-ray without definitive sign of injury.  Initial troponin was mildly elevated and second troponin was trending down so patient deemed safe for discharge. Unclear what caused fall and what he fell against.   -? syncope -at least once a week gets very dizzy with standing- does not know bp today -son does not think passed out- heard thud and then dad immediately called his name  -family did call 911 and he did take 2 nitroglycerin-  not sure if heart related  -Did have white count elevation (new finding), mild anemia that was microcytic, mild thrombocytopenia, elevated creatinine per baseline BMP largely normal for his age, EKG with no significant change from prior.  Orthostatic vital signs were negative and although initial blood pressure was elevated once given home blood pressure medications blood pressure trended down - Thought most likely to have a nondisplaced rib fracture that was not apparent on imaging  He was given vicodin for pain -if completely still states no pain. When he moves up to 9-10 pain  Bruising under his right eye and pain over right lower ribs with some bruising A/P: ED follow-up for likely rib fractures-clinically that is the diagnosis - Offered rib films x-ray but discussed unlikely to change management even if confirmed fracture - Discussed risk of DVT with sedentary activity but being on Eliquis likely reduces risk - Discussed pneumonia risk-strongly encouraged to purchase and use incentive spirometer regularly - Pain control with Vicodin-use 2 pills before bed but none today as he did not find effective but did seem to be able to sleep so would be worth trialing again-I can certainly refill as needed-asked them to  let me know when he is down to 3 to 4 pills-would like update on how he is using medication at that time - Home health physical therapy and Occupational Therapy recommended though may want to delay start for a week or so but high risk for deconditioning -Did have slightly elevated troponin but was trending down-wonder if stunning from the fall could have contributed but chest pain remains right-sided-doubt cardiac issue -After discussion today certainly does not sound like syncope but instead presyncope and then a fall-needs to be closely monitored and we are able to get blood pressures in the office we may need to reduce his medications.  Still recommended cardiology follow-up to be on the cautious side and also recommended position changes always with support at this point -We had planned for an office visit today but he simply was too weak and in too much pain to get to the office-encouraged activity as much as able/as long as he is comfortable and discussed risks of deconditioning  Recommended follow up: We are hoping he can keep his December visit Future Appointments  Date Time Provider [place]  07/16/2022 11:00 AM [name], MD [facility]  07/21/2022  1:50 PM Pieter Partridge, DO [facility] None  07/28/2022 11:15 AM Kirsteins, Luanna Salk, MD [facility] CPR  09/04/2022 10:30 AM [facility] PHARMACIST [facility]  09/11/2022  3:00 PM [facility] None  09/11/2022  3:30 PM Wyatt Portela, MD [facility] None  12/11/2022  1:00 PM [facility] HEALTH COACH [facility]    Lab/Order associations:   ICD-10-CM   1. Closed fracture of one rib of right side, initial encounter  S22.31XA Ambulatory referral to Home Health     Time Spent: 43 minutes of total time (4:02 PM-4:45 PM) was spent on the date of the encounter performing the following actions: chart review prior to seeing the patient including summarizing/reviewing ED notes, obtaining history from patient as well as  daughter and son, counseling on the treatment plan as well as potential options, placing orders, and documenting in our EHR.   Return precautions advised.  [name], MD

## 2022-06-17 ENCOUNTER — Telehealth: Payer: Self-pay | Admitting: Family Medicine

## 2022-06-17 DIAGNOSIS — N184 Chronic kidney disease, stage 4 (severe): Secondary | ICD-10-CM | POA: Diagnosis not present

## 2022-06-17 DIAGNOSIS — I2510 Atherosclerotic heart disease of native coronary artery without angina pectoris: Secondary | ICD-10-CM | POA: Diagnosis not present

## 2022-06-17 DIAGNOSIS — Z952 Presence of prosthetic heart valve: Secondary | ICD-10-CM | POA: Diagnosis not present

## 2022-06-17 DIAGNOSIS — Z8546 Personal history of malignant neoplasm of prostate: Secondary | ICD-10-CM | POA: Diagnosis not present

## 2022-06-17 DIAGNOSIS — N401 Enlarged prostate with lower urinary tract symptoms: Secondary | ICD-10-CM | POA: Diagnosis not present

## 2022-06-17 DIAGNOSIS — G609 Hereditary and idiopathic neuropathy, unspecified: Secondary | ICD-10-CM | POA: Diagnosis not present

## 2022-06-17 DIAGNOSIS — H409 Unspecified glaucoma: Secondary | ICD-10-CM | POA: Diagnosis not present

## 2022-06-17 DIAGNOSIS — I6521 Occlusion and stenosis of right carotid artery: Secondary | ICD-10-CM | POA: Diagnosis not present

## 2022-06-17 DIAGNOSIS — G43909 Migraine, unspecified, not intractable, without status migrainosus: Secondary | ICD-10-CM | POA: Diagnosis not present

## 2022-06-17 DIAGNOSIS — I5032 Chronic diastolic (congestive) heart failure: Secondary | ICD-10-CM | POA: Diagnosis not present

## 2022-06-17 DIAGNOSIS — R42 Dizziness and giddiness: Secondary | ICD-10-CM | POA: Diagnosis not present

## 2022-06-17 DIAGNOSIS — M129 Arthropathy, unspecified: Secondary | ICD-10-CM | POA: Diagnosis not present

## 2022-06-17 DIAGNOSIS — D696 Thrombocytopenia, unspecified: Secondary | ICD-10-CM | POA: Diagnosis not present

## 2022-06-17 DIAGNOSIS — S2231XD Fracture of one rib, right side, subsequent encounter for fracture with routine healing: Secondary | ICD-10-CM | POA: Diagnosis not present

## 2022-06-17 DIAGNOSIS — E785 Hyperlipidemia, unspecified: Secondary | ICD-10-CM | POA: Diagnosis not present

## 2022-06-17 DIAGNOSIS — E538 Deficiency of other specified B group vitamins: Secondary | ICD-10-CM | POA: Diagnosis not present

## 2022-06-17 DIAGNOSIS — I130 Hypertensive heart and chronic kidney disease with heart failure and stage 1 through stage 4 chronic kidney disease, or unspecified chronic kidney disease: Secondary | ICD-10-CM | POA: Diagnosis not present

## 2022-06-17 DIAGNOSIS — Z86010 Personal history of colonic polyps: Secondary | ICD-10-CM | POA: Diagnosis not present

## 2022-06-17 DIAGNOSIS — Z7901 Long term (current) use of anticoagulants: Secondary | ICD-10-CM | POA: Diagnosis not present

## 2022-06-17 DIAGNOSIS — I480 Paroxysmal atrial fibrillation: Secondary | ICD-10-CM | POA: Diagnosis not present

## 2022-06-17 DIAGNOSIS — R351 Nocturia: Secondary | ICD-10-CM | POA: Diagnosis not present

## 2022-06-17 DIAGNOSIS — Z9181 History of falling: Secondary | ICD-10-CM | POA: Diagnosis not present

## 2022-06-17 DIAGNOSIS — I69351 Hemiplegia and hemiparesis following cerebral infarction affecting right dominant side: Secondary | ICD-10-CM | POA: Diagnosis not present

## 2022-06-17 DIAGNOSIS — D631 Anemia in chronic kidney disease: Secondary | ICD-10-CM | POA: Diagnosis not present

## 2022-06-17 DIAGNOSIS — F4321 Adjustment disorder with depressed mood: Secondary | ICD-10-CM | POA: Diagnosis not present

## 2022-06-17 NOTE — Telephone Encounter (Signed)
..  Home Health Verbal Orders  Agency:  [facility] home health   Caller: Berton Lan, Physical therapist [phone]  Requesting OT/ PT/ Skilled nursing/ Social Work/ Speech:  PT  Reason for Request:  Strengthening and gait   Frequency:  Once a week x 8 weeks

## 2022-06-18 NOTE — Telephone Encounter (Signed)
Called and lm on SONI vm with VO.

## 2022-06-24 ENCOUNTER — Telehealth: Payer: Self-pay | Admitting: Family Medicine

## 2022-06-24 NOTE — Telephone Encounter (Signed)
.  Home Health Certification or Plan of Care Tracking  Is this a Certification or Plan of Care? Yes  [place] Agency: [facility]  Order Number:  [id number]  Has charge sheet been attached? yes  Where has form been placed:  In provider's box  Faxed to:   [phone]

## 2022-06-25 ENCOUNTER — Telehealth: Payer: Self-pay | Admitting: Adult Health

## 2022-06-25 NOTE — Telephone Encounter (Signed)
Called patient to reschedule appointment with new provider. Patient notified.

## 2022-06-30 DIAGNOSIS — I2510 Atherosclerotic heart disease of native coronary artery without angina pectoris: Secondary | ICD-10-CM | POA: Diagnosis not present

## 2022-06-30 DIAGNOSIS — I69351 Hemiplegia and hemiparesis following cerebral infarction affecting right dominant side: Secondary | ICD-10-CM | POA: Diagnosis not present

## 2022-06-30 DIAGNOSIS — N184 Chronic kidney disease, stage 4 (severe): Secondary | ICD-10-CM | POA: Diagnosis not present

## 2022-06-30 DIAGNOSIS — I5032 Chronic diastolic (congestive) heart failure: Secondary | ICD-10-CM | POA: Diagnosis not present

## 2022-06-30 DIAGNOSIS — I130 Hypertensive heart and chronic kidney disease with heart failure and stage 1 through stage 4 chronic kidney disease, or unspecified chronic kidney disease: Secondary | ICD-10-CM | POA: Diagnosis not present

## 2022-06-30 DIAGNOSIS — S2231XD Fracture of one rib, right side, subsequent encounter for fracture with routine healing: Secondary | ICD-10-CM | POA: Diagnosis not present

## 2022-07-08 DIAGNOSIS — S2231XD Fracture of one rib, right side, subsequent encounter for fracture with routine healing: Secondary | ICD-10-CM | POA: Diagnosis not present

## 2022-07-08 DIAGNOSIS — I2510 Atherosclerotic heart disease of native coronary artery without angina pectoris: Secondary | ICD-10-CM | POA: Diagnosis not present

## 2022-07-08 DIAGNOSIS — I130 Hypertensive heart and chronic kidney disease with heart failure and stage 1 through stage 4 chronic kidney disease, or unspecified chronic kidney disease: Secondary | ICD-10-CM | POA: Diagnosis not present

## 2022-07-08 DIAGNOSIS — I69351 Hemiplegia and hemiparesis following cerebral infarction affecting right dominant side: Secondary | ICD-10-CM | POA: Diagnosis not present

## 2022-07-08 DIAGNOSIS — N184 Chronic kidney disease, stage 4 (severe): Secondary | ICD-10-CM | POA: Diagnosis not present

## 2022-07-08 DIAGNOSIS — I5032 Chronic diastolic (congestive) heart failure: Secondary | ICD-10-CM | POA: Diagnosis not present

## 2022-07-09 DIAGNOSIS — N184 Chronic kidney disease, stage 4 (severe): Secondary | ICD-10-CM | POA: Diagnosis not present

## 2022-07-09 DIAGNOSIS — I69351 Hemiplegia and hemiparesis following cerebral infarction affecting right dominant side: Secondary | ICD-10-CM | POA: Diagnosis not present

## 2022-07-09 DIAGNOSIS — S2231XD Fracture of one rib, right side, subsequent encounter for fracture with routine healing: Secondary | ICD-10-CM | POA: Diagnosis not present

## 2022-07-09 DIAGNOSIS — I2510 Atherosclerotic heart disease of native coronary artery without angina pectoris: Secondary | ICD-10-CM | POA: Diagnosis not present

## 2022-07-09 DIAGNOSIS — I130 Hypertensive heart and chronic kidney disease with heart failure and stage 1 through stage 4 chronic kidney disease, or unspecified chronic kidney disease: Secondary | ICD-10-CM | POA: Diagnosis not present

## 2022-07-09 DIAGNOSIS — I5032 Chronic diastolic (congestive) heart failure: Secondary | ICD-10-CM | POA: Diagnosis not present

## 2022-07-12 DIAGNOSIS — I2510 Atherosclerotic heart disease of native coronary artery without angina pectoris: Secondary | ICD-10-CM | POA: Diagnosis not present

## 2022-07-12 DIAGNOSIS — S2231XD Fracture of one rib, right side, subsequent encounter for fracture with routine healing: Secondary | ICD-10-CM | POA: Diagnosis not present

## 2022-07-12 DIAGNOSIS — I69351 Hemiplegia and hemiparesis following cerebral infarction affecting right dominant side: Secondary | ICD-10-CM | POA: Diagnosis not present

## 2022-07-12 DIAGNOSIS — I5032 Chronic diastolic (congestive) heart failure: Secondary | ICD-10-CM | POA: Diagnosis not present

## 2022-07-12 DIAGNOSIS — N184 Chronic kidney disease, stage 4 (severe): Secondary | ICD-10-CM | POA: Diagnosis not present

## 2022-07-12 DIAGNOSIS — I130 Hypertensive heart and chronic kidney disease with heart failure and stage 1 through stage 4 chronic kidney disease, or unspecified chronic kidney disease: Secondary | ICD-10-CM | POA: Diagnosis not present

## 2022-07-15 DIAGNOSIS — N184 Chronic kidney disease, stage 4 (severe): Secondary | ICD-10-CM | POA: Diagnosis not present

## 2022-07-15 DIAGNOSIS — I130 Hypertensive heart and chronic kidney disease with heart failure and stage 1 through stage 4 chronic kidney disease, or unspecified chronic kidney disease: Secondary | ICD-10-CM | POA: Diagnosis not present

## 2022-07-15 DIAGNOSIS — S2231XD Fracture of one rib, right side, subsequent encounter for fracture with routine healing: Secondary | ICD-10-CM | POA: Diagnosis not present

## 2022-07-15 DIAGNOSIS — I2510 Atherosclerotic heart disease of native coronary artery without angina pectoris: Secondary | ICD-10-CM | POA: Diagnosis not present

## 2022-07-15 DIAGNOSIS — I5032 Chronic diastolic (congestive) heart failure: Secondary | ICD-10-CM | POA: Diagnosis not present

## 2022-07-15 DIAGNOSIS — I69351 Hemiplegia and hemiparesis following cerebral infarction affecting right dominant side: Secondary | ICD-10-CM | POA: Diagnosis not present

## 2022-07-16 ENCOUNTER — Encounter: Payer: Self-pay | Admitting: Family Medicine

## 2022-07-16 ENCOUNTER — Ambulatory Visit (INDEPENDENT_AMBULATORY_CARE_PROVIDER_SITE_OTHER): Payer: Medicare Other | Admitting: Family Medicine

## 2022-07-16 VITALS — BP 150/76 | HR 93 | Temp 97.3°F | Ht 68.0 in | Wt 189.2 lb

## 2022-07-16 DIAGNOSIS — I6521 Occlusion and stenosis of right carotid artery: Secondary | ICD-10-CM

## 2022-07-16 DIAGNOSIS — I25119 Atherosclerotic heart disease of native coronary artery with unspecified angina pectoris: Secondary | ICD-10-CM | POA: Diagnosis not present

## 2022-07-16 DIAGNOSIS — N184 Chronic kidney disease, stage 4 (severe): Secondary | ICD-10-CM | POA: Diagnosis not present

## 2022-07-16 DIAGNOSIS — I10 Essential (primary) hypertension: Secondary | ICD-10-CM

## 2022-07-16 DIAGNOSIS — E785 Hyperlipidemia, unspecified: Secondary | ICD-10-CM | POA: Diagnosis not present

## 2022-07-16 DIAGNOSIS — I480 Paroxysmal atrial fibrillation: Secondary | ICD-10-CM | POA: Diagnosis not present

## 2022-07-16 LAB — CBC WITH DIFFERENTIAL/PLATELET
Basophils Absolute: 0 10*3/uL (ref 0.0–0.1)
Basophils Relative: 0.4 % (ref 0.0–3.0)
Eosinophils Absolute: 0.3 10*3/uL (ref 0.0–0.7)
Eosinophils Relative: 2.7 % (ref 0.0–5.0)
HCT: 31.8 % — ABNORMAL LOW (ref 39.0–52.0)
Hemoglobin: 10.7 g/dL — ABNORMAL LOW (ref 13.0–17.0)
Lymphocytes Relative: 9.5 % — ABNORMAL LOW (ref 12.0–46.0)
Lymphs Abs: 1.2 10*3/uL (ref 0.7–4.0)
MCHC: 33.5 g/dL (ref 30.0–36.0)
MCV: 102.6 fl — ABNORMAL HIGH (ref 78.0–100.0)
Monocytes Absolute: 1.5 10*3/uL — ABNORMAL HIGH (ref 0.1–1.0)
Monocytes Relative: 11.9 % (ref 3.0–12.0)
Neutro Abs: 9.5 10*3/uL — ABNORMAL HIGH (ref 1.4–7.7)
Neutrophils Relative %: 75.5 % (ref 43.0–77.0)
Platelets: 133 10*3/uL — ABNORMAL LOW (ref 150.0–400.0)
RBC: 3.11 Mil/uL — ABNORMAL LOW (ref 4.22–5.81)
RDW: 14 % (ref 11.5–15.5)
WBC: 12.6 10*3/uL — ABNORMAL HIGH (ref 4.0–10.5)

## 2022-07-16 LAB — COMPREHENSIVE METABOLIC PANEL
ALT: 10 U/L (ref 0–53)
AST: 18 U/L (ref 0–37)
Albumin: 3.6 g/dL (ref 3.5–5.2)
Alkaline Phosphatase: 69 U/L (ref 39–117)
BUN: 43 mg/dL — ABNORMAL HIGH (ref 6–23)
CO2: 25 mEq/L (ref 19–32)
Calcium: 8.2 mg/dL — ABNORMAL LOW (ref 8.4–10.5)
Chloride: 111 mEq/L (ref 96–112)
Creatinine, Ser: 2.82 mg/dL — ABNORMAL HIGH (ref 0.40–1.50)
GFR: 19.17 mL/min — ABNORMAL LOW (ref >60.00)
Glucose, Bld: 98 mg/dL (ref 70–99)
Potassium: 4.8 mEq/L (ref 3.5–5.1)
Sodium: 142 mEq/L (ref 135–145)
Total Bilirubin: 0.4 mg/dL (ref 0.2–1.2)
Total Protein: 6.3 g/dL (ref 6.0–8.3)

## 2022-07-16 LAB — LDL CHOLESTEROL, DIRECT: Direct LDL: 38 mg/dL

## 2022-07-16 NOTE — Patient Instructions (Addendum)
Please stop by lab before you go If you have mychart- we will send your results within 3 business days of [place] receiving them.  If you do not have mychart- we will call you about results within 5 business days of [place] receiving them.  *please also note that you will see labs on mychart as soon as they post. I will later go in and write notes on them- will say "notes from Dr. [name]"   I am considering going up on lasix but want Dr. Shelva Majestic input first- I rwillr each back out to you about this- would prefer blood pressure at least <150 and your last few readings have been aabove this  Thrilled you are improving and no falls!   Recommended follow up: Return in about 2 months (around 09/16/2022) for followup or sooner if needed.Schedule b4 you leave.

## 2022-07-16 NOTE — Progress Notes (Signed)
Phone [phone] In person visit   Subjective:   Mark Jackson. is a 88 y.o. year old very pleasant male patient who presents for/with See problem oriented charting Chief Complaint  Patient presents with   Loss of Consciousness    Follow up, pt states all is well since     Past Medical History-  Patient Active Problem List   Diagnosis Date Noted   Right hemiparesis ([place]) 03/05/2022    Priority: High   Chronic diastolic heart failure ([place]) 12/12/2019    Priority: High   S/P TAVR (transcatheter aortic valve replacement) 12/12/2019    Priority: High   CAD S/P DES PCI to proximal LAD 03/22/2015    Priority: High   Paroxysmal atrial fibrillation ([place]) 08/20/2014    Priority: High    Class: Diagnosis of   Chronic kidney disease (CKD), active medical management without dialysis, stage 4 (severe) ([place]) 08/20/2014    Priority: High   Personal history of prostate cancer 10/22/2008    Priority: High   Hyperglycemia 09/27/2017    Priority: Medium    B12 deficiency 01/06/2017    Priority: Medium    BPH associated with nocturia 06/15/2016    Priority: Medium    Hereditary and idiopathic peripheral neuropathy 01/12/2014    Priority: Medium    H/O syncope 09/03/2013    Priority: Medium    Right-sided carotid artery disease ([place]) 03/02/2013    Priority: Medium    Hyperlipidemia with target LDL less than 70 03/02/2013    Priority: Medium    Migraine without aura 10/26/2012    Priority: Medium    Anemia 10/23/2008    Priority: Medium    Essential hypertension 10/22/2008    Priority: Medium    Excessive daytime sleepiness 10/22/2008    Priority: Medium    Thrombocytopenia, unspecified ([place]) 12/11/2021    Priority: Low   Syncope and collapse 11/27/2019    Priority: Low   Fatigue 11/08/2017    Priority: Low   Chronic diarrhea 01/06/2017    Priority: Low   Perianal dermatitis 06/19/2015    Priority: Low   Rectal bleeding 04/25/2015    Priority: Low   Dyspnea  08/31/2014    Priority: Low   Long term current use of anticoagulant therapy 08/27/2014    Priority: Low   GLAUCOMA 10/23/2008    Priority: Low   Hemorrhoids 10/22/2008    Priority: Low   Arthropathy 10/22/2008    Priority: Low   History of colonic polyps 10/22/2008    Priority: Low   Situational depression 03/05/2022   Loud snoring 02/04/2022   Hematuria 12/11/2020   Bilateral lower extremity edema 04/15/2017   Crescendo angina ([place]) 06/01/2016   Aortic stenosis 03/14/2015    Medications- reviewed and updated Current Outpatient Medications  Medication Sig Dispense Refill   acetaminophen (TYLENOL) 325 MG tablet Take 2 tablets (650 mg total) by mouth every 4 (four) hours as needed for mild pain (or temp > 37.5 C (99.5 F)).     amoxicillin (AMOXIL) 500 MG capsule For pre dental procedures     apixaban (ELIQUIS) 2.5 MG TABS tablet Take 1 tablet (2.5 mg total) by mouth 2 (two) times daily. 180 tablet 1   diclofenac Sodium (VOLTAREN) 1 % GEL Apply 2 g topically 4 (four) times daily. 2 g 0   diltiazem (CARDIZEM CD) 240 MG 24 hr capsule Take 1 capsule (240 mg total) by mouth daily. 90 capsule 3   ezetimibe (ZETIA) 10 MG tablet Take 1  tablet (10 mg total) by mouth daily. 90 tablet 3   ferrous sulfate 325 (65 FE) MG tablet Take 325 mg by mouth daily with breakfast.     FLUoxetine (PROZAC) 10 MG capsule Take 1 capsule (10 mg total) by mouth daily. 30 capsule 2   furosemide (LASIX) 40 MG tablet Take 0.5 tablets (20 mg total) by mouth daily. 30 tablet 0   isosorbide mononitrate (IMDUR) 60 MG 24 hr tablet TAKE ONE TAB DAILY AFTER BREAKFAST. MAY TAKE AN ADDITONAL TAB IN EVENING X2DAYS IF USE NITROGLYCER 120 tablet 3   Multiple Vitamins-Minerals (CENTRUM SILVER ADULT 50+) TABS Take 1 tablet by mouth daily.     nitroGLYCERIN (NITROSTAT) 0.4 MG SL tablet ONE TABLET UNDER TONGUE WHEN NEEDED FOR CHEST PAIN. MAY REPEAT IN 5 MINUTES. (Patient taking differently: Place 0.4 mg under the tongue every 5  (five) minutes as needed for chest pain.) 25 tablet 0   rosuvastatin (CRESTOR) 20 MG tablet TAKE (1) TABLET DAILY AT BEDTIME. 90 tablet 1   topiramate (TOPAMAX) 100 MG tablet Take 1.5 tablets (150 mg total) by mouth daily. 135 tablet 3   traMADol (ULTRAM) 50 MG tablet Take 50 mg by mouth 3 (three) times daily as needed.     No current facility-administered medications for this visit.     Objective:  BP (!) 150/76 (BP Location: Left Arm, Cuff Size: Normal)   Pulse 93   Temp (!) 97.3 F (36.3 C)   Ht '5\' 8"'$  (1.727 m)   Wt 189 lb 3.2 oz (85.8 kg)   SpO2 100%   BMI 28.77 kg/m  Gen: NAD, resting comfortably CV: RRR no murmurs rubs or gallops Lungs: CTAB no crackles, wheeze, rhonchi Ext: 2+ edema Skin: warm, dry    Assessment and Plan   # Prior presyncope/fall S: Once again patient had COVID in late September and also with history of stroke causing residual balance disorder ultimately had a fall-there was some concern about possible syncope.  He was seen in the emergency department and CT head was negative for acute bleed and cervical spine and chest x-ray were largely reassuring.  Had downward trend on troponin.  Son did not think that patient had passed out because of immediately call his name after hearing a thud-after discussion last visit certainly sounded more like presyncope and then a fall -At that time we thought patient had a nondisplaced rib fracture that was not apparent on imaging-he had rather severe pain which led to video visit - He was also getting very dizzy about once a week with standing  -We recommended home health physical and Occupational Therapy- they are still coming to the house  -Today reports still falling backwards at times- does not feel lightheaded with it. No falls since last visit - no longer getting lightheaded once a week A/P: Thankfully no more presyncopal episodes.  In addition his strength seems to have significantly improved    #CAD status post  DES PCI to proximal LAD- sees Dr. Ellyn Hack #History of CVA-Dec 26 2021 hospitalization with right hemiparesis (left thalamic infarction and small left cerebellar infarct thalamic CVA most likely SVD although 2 locations suggests cardioembolic) #hyperlipidemia #Right internal carotid artery stenosis S: Medication: Aspirin was not added as patient already on Eliquis during May 2023 stroke hospitalization Crestor 20 mg, Zetia 10 mg added after May 2023 stroke  - no chest pain or shortness of breath  Lab Results  Component Value Date   CHOL 132 12/27/2021   HDL 39 (  L) 12/27/2021   LDLCALC 80 12/27/2021   LDLDIRECT 38.0 07/16/2022   TRIG 63 12/27/2021   CHOLHDL 3.4 12/27/2021   A/P: CAD largely asymptomatic-continue current medications Lipids previously above goal but LDL down to 38 on check today-continue current medicine  # Atrial fibrillation S: Rate controlled with diltiazem '240mg'$  XR  Anticoagulated with eliquis 2.5 mg BID A/P: appropriately anticoagulated and rate controlled- continue current medicine   #hypertension # CKD stage IV/CHF S: medication: imdur '60mg'$ , diltiazem '240mg'$ , lasix 40 mg- half tablet Home readings #s: Does not check regularly BP Readings from Last 3 Encounters:  07/16/22 (!) 150/76  06/14/22 (!) 179/82  05/09/22 (!) 177/84  A/P: Blood pressure poorly controlled and still with 2+ edema with CKD IV and CHF-considering increasing Lasix to full 40 mg tablet-I realize he has had some orthostatic issues and presyncopal issues with lower blood pressures but would like to at least get him below 150 or perhaps 140-I am going to reach out to his nephrologist Dr. Moshe Cipro for her expert opinion -I hesitate to increase diltiazem though I think his heart rate can handle it due to concern for increased edema   Recommended follow up: Return in about 2 months (around 09/16/2022) for followup or sooner if needed.Schedule b4 you leave. Future Appointments  Date Time Provider  [place]  07/21/2022  1:50 PM Pieter Partridge, DO [facility] None  07/28/2022 11:15 AM Kirsteins, Luanna Salk, MD [facility] CPR  09/04/2022 10:30 AM [facility] CCM PHARMACIST [facility]  09/11/2022  1:30 PM [facility] None  09/11/2022  2:15 PM Causey, Charlestine Massed, NP [facility] None  09/23/2022 11:00 AM [name], MD [facility]  12/11/2022  1:00 PM [facility] HEALTH COACH [facility]    Lab/Order associations: half banana, bowl of cereal, coffee, OJ with tea   ICD-10-CM   1. Hyperlipidemia with target LDL less than 70  E78.5 CBC with Differential/Platelet    Comprehensive metabolic panel    LDL cholesterol, direct    2. Paroxysmal atrial fibrillation (H[facility])  I48.0     3. Essential hypertension  I10     4. Chronic kidney disease (CKD), active medical management without dialysis, stage 4 (severe) (HCC)  N18.4     5. CAD S/P DES PCI to proximal LAD  I25.119      Return precautions advised.  [name], MD

## 2022-07-17 DIAGNOSIS — G609 Hereditary and idiopathic neuropathy, unspecified: Secondary | ICD-10-CM | POA: Diagnosis not present

## 2022-07-17 DIAGNOSIS — I480 Paroxysmal atrial fibrillation: Secondary | ICD-10-CM | POA: Diagnosis not present

## 2022-07-17 DIAGNOSIS — M129 Arthropathy, unspecified: Secondary | ICD-10-CM | POA: Diagnosis not present

## 2022-07-17 DIAGNOSIS — R42 Dizziness and giddiness: Secondary | ICD-10-CM | POA: Diagnosis not present

## 2022-07-17 DIAGNOSIS — I69351 Hemiplegia and hemiparesis following cerebral infarction affecting right dominant side: Secondary | ICD-10-CM | POA: Diagnosis not present

## 2022-07-17 DIAGNOSIS — Z86010 Personal history of colonic polyps: Secondary | ICD-10-CM | POA: Diagnosis not present

## 2022-07-17 DIAGNOSIS — N184 Chronic kidney disease, stage 4 (severe): Secondary | ICD-10-CM | POA: Diagnosis not present

## 2022-07-17 DIAGNOSIS — D696 Thrombocytopenia, unspecified: Secondary | ICD-10-CM | POA: Diagnosis not present

## 2022-07-17 DIAGNOSIS — N401 Enlarged prostate with lower urinary tract symptoms: Secondary | ICD-10-CM | POA: Diagnosis not present

## 2022-07-17 DIAGNOSIS — G43909 Migraine, unspecified, not intractable, without status migrainosus: Secondary | ICD-10-CM | POA: Diagnosis not present

## 2022-07-17 DIAGNOSIS — Z7901 Long term (current) use of anticoagulants: Secondary | ICD-10-CM | POA: Diagnosis not present

## 2022-07-17 DIAGNOSIS — F4321 Adjustment disorder with depressed mood: Secondary | ICD-10-CM | POA: Diagnosis not present

## 2022-07-17 DIAGNOSIS — I130 Hypertensive heart and chronic kidney disease with heart failure and stage 1 through stage 4 chronic kidney disease, or unspecified chronic kidney disease: Secondary | ICD-10-CM | POA: Diagnosis not present

## 2022-07-17 DIAGNOSIS — Z9181 History of falling: Secondary | ICD-10-CM | POA: Diagnosis not present

## 2022-07-17 DIAGNOSIS — H409 Unspecified glaucoma: Secondary | ICD-10-CM | POA: Diagnosis not present

## 2022-07-17 DIAGNOSIS — I5032 Chronic diastolic (congestive) heart failure: Secondary | ICD-10-CM | POA: Diagnosis not present

## 2022-07-17 DIAGNOSIS — R351 Nocturia: Secondary | ICD-10-CM | POA: Diagnosis not present

## 2022-07-17 DIAGNOSIS — Z952 Presence of prosthetic heart valve: Secondary | ICD-10-CM | POA: Diagnosis not present

## 2022-07-17 DIAGNOSIS — E538 Deficiency of other specified B group vitamins: Secondary | ICD-10-CM | POA: Diagnosis not present

## 2022-07-17 DIAGNOSIS — E785 Hyperlipidemia, unspecified: Secondary | ICD-10-CM | POA: Diagnosis not present

## 2022-07-17 DIAGNOSIS — S2231XD Fracture of one rib, right side, subsequent encounter for fracture with routine healing: Secondary | ICD-10-CM | POA: Diagnosis not present

## 2022-07-17 DIAGNOSIS — D631 Anemia in chronic kidney disease: Secondary | ICD-10-CM | POA: Diagnosis not present

## 2022-07-17 DIAGNOSIS — Z8546 Personal history of malignant neoplasm of prostate: Secondary | ICD-10-CM | POA: Diagnosis not present

## 2022-07-17 DIAGNOSIS — I6521 Occlusion and stenosis of right carotid artery: Secondary | ICD-10-CM | POA: Diagnosis not present

## 2022-07-17 DIAGNOSIS — I2510 Atherosclerotic heart disease of native coronary artery without angina pectoris: Secondary | ICD-10-CM | POA: Diagnosis not present
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
## 2022-07-21 ENCOUNTER — Telehealth: Payer: Self-pay | Admitting: Family Medicine

## 2022-07-21 ENCOUNTER — Ambulatory Visit (INDEPENDENT_AMBULATORY_CARE_PROVIDER_SITE_OTHER): Payer: Medicare Other | Admitting: Neurology

## 2022-07-21 ENCOUNTER — Encounter: Payer: Self-pay | Admitting: Neurology

## 2022-07-21 VITALS — BP 149/81 | HR 79 | Ht 68.0 in | Wt 191.2 lb

## 2022-07-21 DIAGNOSIS — E785 Hyperlipidemia, unspecified: Secondary | ICD-10-CM | POA: Diagnosis not present

## 2022-07-21 DIAGNOSIS — I480 Paroxysmal atrial fibrillation: Secondary | ICD-10-CM

## 2022-07-21 DIAGNOSIS — I6521 Occlusion and stenosis of right carotid artery: Secondary | ICD-10-CM

## 2022-07-21 DIAGNOSIS — I6381 Other cerebral infarction due to occlusion or stenosis of small artery: Secondary | ICD-10-CM

## 2022-07-21 DIAGNOSIS — I10 Essential (primary) hypertension: Secondary | ICD-10-CM

## 2022-07-21 MED ORDER — FUROSEMIDE 40 MG PO TABS: 40.0000 mg | ORAL_TABLET | Freq: Every day | ORAL | 3 refills | Status: DC

## 2022-07-21 NOTE — Telephone Encounter (Signed)
-----   Message from Corliss Parish, MD sent at 07/21/2022  2:54 PM EST ----- Regarding: RE: need your opinion/help- from PCP Hello-  yes I think he could tolerate an increase in his lasix-  I would do that   Corliss Parish   ----- Message ----- From: [name], MD Sent: 07/16/2022   9:06 PM EST To: Corliss Parish, MD Subject: need your opinion/help- from PCP               Dr. Moshe Cipro- need your help on him if possible. Considered increasing lasix but wanted your opinion first- see below  -[name] PCP   #hypertension # CKD stage IV S: medication: imdur '60mg'$ , diltiazem '240mg'$ , lasix 40 mg- half tablet Home readings #s: Does not check regularly BP Readings from Last 3 Encounters: 07/16/22 (!) 150/76 06/14/22 (!) 179/82 05/09/22 (!) 177/84 A/P: Blood pressure poorly controlled and still with 2+ edema-considering increasing Lasix to full 40 mg tablet-I realize he has had some orthostatic issues and presyncopal issues with lower blood pressures but would like to at least get him below 150 or perhaps 140-I am going to reach out to his nephrologist Dr. Moshe Cipro for her expert opinion -I hesitate to increase diltiazem though I think his heart rate can handle it due to concern for increased edema

## 2022-07-21 NOTE — Patient Instructions (Signed)
1  Secondary stroke prevention as managed by PCP and cardiology:  - Eliquis  - Statin therapy.   - Normotensive blood pressure.  Monitor for orthostatic hypotension  - Hgb A1c goal less than 7 2  Follow up with vascular surgery for routine monitoring of ICA stenosis 3  Advised to get eye exam 4  Follow up 6 months.

## 2022-07-21 NOTE — Telephone Encounter (Signed)
Caller states: - She faxed over physician orders to PCP office on 12/06 but hasn't received a completed form back   Caller requests: -Form be filled out as soon as possible  I requested caller re-fax paperwork just in case it wasn't received by PCP. Caller verbalized understanding and stated she would re-fax following our call.   Can you verify that this form was received?

## 2022-07-21 NOTE — Telephone Encounter (Signed)
I have not seen any forms either.  Please let them know I did hear back from nephrology/kidney doctor and they did recommend/agree with increasing Lasix so I have ordered an increase to 40 mg-lets recheck at February visit but I would also like to have an update on blood pressure levels perhaps in early January

## 2022-07-21 NOTE — Telephone Encounter (Signed)
I was out of the office during that week so I have not received any form. Will be on the look out for the one requested in this message.

## 2022-07-23 ENCOUNTER — Other Ambulatory Visit: Payer: Self-pay

## 2022-07-23 DIAGNOSIS — N184 Chronic kidney disease, stage 4 (severe): Secondary | ICD-10-CM | POA: Diagnosis not present

## 2022-07-23 DIAGNOSIS — I69351 Hemiplegia and hemiparesis following cerebral infarction affecting right dominant side: Secondary | ICD-10-CM | POA: Diagnosis not present

## 2022-07-23 DIAGNOSIS — S2231XD Fracture of one rib, right side, subsequent encounter for fracture with routine healing: Secondary | ICD-10-CM | POA: Diagnosis not present

## 2022-07-23 DIAGNOSIS — I130 Hypertensive heart and chronic kidney disease with heart failure and stage 1 through stage 4 chronic kidney disease, or unspecified chronic kidney disease: Secondary | ICD-10-CM | POA: Diagnosis not present

## 2022-07-23 DIAGNOSIS — I2510 Atherosclerotic heart disease of native coronary artery without angina pectoris: Secondary | ICD-10-CM | POA: Diagnosis not present

## 2022-07-23 DIAGNOSIS — I5032 Chronic diastolic (congestive) heart failure: Secondary | ICD-10-CM | POA: Diagnosis not present

## 2022-07-23 MED ORDER — TOPIRAMATE 100 MG PO TABS: 150.0000 mg | ORAL_TABLET | Freq: Every day | ORAL | 3 refills | Status: DC

## 2022-07-23 NOTE — Telephone Encounter (Signed)
He is aware to increase the dose right? Just want to make sure he/family directly knows

## 2022-07-23 NOTE — Telephone Encounter (Signed)
Orders placed in your review box.

## 2022-07-24 ENCOUNTER — Telehealth: Payer: Self-pay

## 2022-07-24 MED ORDER — TOPIRAMATE 100 MG PO TABS: 100.0000 mg | ORAL_TABLET | Freq: Two times a day (BID) | ORAL | 3 refills | Status: DC

## 2022-07-24 NOTE — Telephone Encounter (Signed)
Patient daughter stated that patient is having bad headaches and he has been taking 2 tablets ('200mg'$ ) since November, Pharmacy and daughter is asking if a new prescription for 2 tabs daily can be sent in for the patient.

## 2022-07-24 NOTE — Telephone Encounter (Signed)
Called and lm for pt tcb. 

## 2022-07-24 NOTE — Telephone Encounter (Signed)
Called and spoke with pt and he is aware and states he has not been to [facility] to pick up the Rx yet but he does plan too.

## 2022-07-24 NOTE — Telephone Encounter (Signed)
Noted thanks °

## 2022-07-27 DIAGNOSIS — N184 Chronic kidney disease, stage 4 (severe): Secondary | ICD-10-CM | POA: Diagnosis not present

## 2022-07-27 DIAGNOSIS — I2510 Atherosclerotic heart disease of native coronary artery without angina pectoris: Secondary | ICD-10-CM | POA: Diagnosis not present

## 2022-07-27 DIAGNOSIS — I69351 Hemiplegia and hemiparesis following cerebral infarction affecting right dominant side: Secondary | ICD-10-CM | POA: Diagnosis not present

## 2022-07-27 DIAGNOSIS — I5032 Chronic diastolic (congestive) heart failure: Secondary | ICD-10-CM | POA: Diagnosis not present

## 2022-07-27 DIAGNOSIS — I130 Hypertensive heart and chronic kidney disease with heart failure and stage 1 through stage 4 chronic kidney disease, or unspecified chronic kidney disease: Secondary | ICD-10-CM | POA: Diagnosis not present

## 2022-07-27 DIAGNOSIS — S2231XD Fracture of one rib, right side, subsequent encounter for fracture with routine healing: Secondary | ICD-10-CM | POA: Diagnosis not present

## 2022-07-28 ENCOUNTER — Encounter: Payer: Medicare Other | Attending: Physical Medicine & Rehabilitation | Admitting: Physical Medicine & Rehabilitation

## 2022-07-28 ENCOUNTER — Encounter: Payer: Self-pay | Admitting: Physical Medicine & Rehabilitation

## 2022-07-28 ENCOUNTER — Encounter: Payer: Medicare Other | Admitting: Physical Medicine & Rehabilitation

## 2022-07-28 VITALS — Ht 68.0 in | Wt 192.0 lb

## 2022-07-28 DIAGNOSIS — I69398 Other sequelae of cerebral infarction: Secondary | ICD-10-CM | POA: Diagnosis not present

## 2022-07-28 DIAGNOSIS — I6521 Occlusion and stenosis of right carotid artery: Secondary | ICD-10-CM

## 2022-07-28 DIAGNOSIS — E538 Deficiency of other specified B group vitamins: Secondary | ICD-10-CM | POA: Diagnosis not present

## 2022-07-28 DIAGNOSIS — R269 Unspecified abnormalities of gait and mobility: Secondary | ICD-10-CM | POA: Diagnosis not present

## 2022-07-28 DIAGNOSIS — G63 Polyneuropathy in diseases classified elsewhere: Secondary | ICD-10-CM | POA: Diagnosis not present

## 2022-07-28 DIAGNOSIS — I6381 Other cerebral infarction due to occlusion or stenosis of small artery: Secondary | ICD-10-CM | POA: Diagnosis not present

## 2022-07-28 NOTE — Patient Instructions (Addendum)
Believe that much of the balance issue is due to neuropathy related to B12 deficiency   Try Melatonin '3mg'$  at bedtime
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
## 2022-07-29 ENCOUNTER — Ambulatory Visit (INDEPENDENT_AMBULATORY_CARE_PROVIDER_SITE_OTHER): Payer: Medicare Other

## 2022-07-29 DIAGNOSIS — I2510 Atherosclerotic heart disease of native coronary artery without angina pectoris: Secondary | ICD-10-CM | POA: Diagnosis not present

## 2022-07-29 DIAGNOSIS — I5032 Chronic diastolic (congestive) heart failure: Secondary | ICD-10-CM | POA: Diagnosis not present

## 2022-07-29 DIAGNOSIS — E538 Deficiency of other specified B group vitamins: Secondary | ICD-10-CM

## 2022-07-29 DIAGNOSIS — I130 Hypertensive heart and chronic kidney disease with heart failure and stage 1 through stage 4 chronic kidney disease, or unspecified chronic kidney disease: Secondary | ICD-10-CM | POA: Diagnosis not present

## 2022-07-29 DIAGNOSIS — N184 Chronic kidney disease, stage 4 (severe): Secondary | ICD-10-CM | POA: Diagnosis not present

## 2022-07-29 DIAGNOSIS — I69351 Hemiplegia and hemiparesis following cerebral infarction affecting right dominant side: Secondary | ICD-10-CM | POA: Diagnosis not present

## 2022-07-29 DIAGNOSIS — S2231XD Fracture of one rib, right side, subsequent encounter for fracture with routine healing: Secondary | ICD-10-CM | POA: Diagnosis not present

## 2022-07-29 MED ORDER — CYANOCOBALAMIN 1000 MCG/ML IJ SOLN
1000.0000 ug | Freq: Once | INTRAMUSCULAR | Status: AC
  Administered 2022-07-29: 1000 ug via INTRAMUSCULAR

## 2022-07-29 NOTE — Progress Notes (Cosign Needed Addendum)
Patient presented in the office today for B12 injection ordered by Dr. [name]. He was given Cyanocobalamin 1,000 mcg/ml in R deltoid-he tolerated injection well and scheduled his next injection for next month

## 2022-08-05 DIAGNOSIS — I5032 Chronic diastolic (congestive) heart failure: Secondary | ICD-10-CM | POA: Diagnosis not present

## 2022-08-05 DIAGNOSIS — I130 Hypertensive heart and chronic kidney disease with heart failure and stage 1 through stage 4 chronic kidney disease, or unspecified chronic kidney disease: Secondary | ICD-10-CM | POA: Diagnosis not present

## 2022-08-05 DIAGNOSIS — N184 Chronic kidney disease, stage 4 (severe): Secondary | ICD-10-CM | POA: Diagnosis not present

## 2022-08-05 DIAGNOSIS — S2231XD Fracture of one rib, right side, subsequent encounter for fracture with routine healing: Secondary | ICD-10-CM | POA: Diagnosis not present

## 2022-08-05 DIAGNOSIS — I69351 Hemiplegia and hemiparesis following cerebral infarction affecting right dominant side: Secondary | ICD-10-CM | POA: Diagnosis not present

## 2022-08-05 DIAGNOSIS — I2510 Atherosclerotic heart disease of native coronary artery without angina pectoris: Secondary | ICD-10-CM | POA: Diagnosis not present

## 2022-08-07 DIAGNOSIS — N184 Chronic kidney disease, stage 4 (severe): Secondary | ICD-10-CM | POA: Diagnosis not present

## 2022-08-07 DIAGNOSIS — I130 Hypertensive heart and chronic kidney disease with heart failure and stage 1 through stage 4 chronic kidney disease, or unspecified chronic kidney disease: Secondary | ICD-10-CM | POA: Diagnosis not present

## 2022-08-07 DIAGNOSIS — I2510 Atherosclerotic heart disease of native coronary artery without angina pectoris: Secondary | ICD-10-CM | POA: Diagnosis not present

## 2022-08-07 DIAGNOSIS — I69351 Hemiplegia and hemiparesis following cerebral infarction affecting right dominant side: Secondary | ICD-10-CM | POA: Diagnosis not present

## 2022-08-07 DIAGNOSIS — I5032 Chronic diastolic (congestive) heart failure: Secondary | ICD-10-CM | POA: Diagnosis not present

## 2022-08-07 DIAGNOSIS — S2231XD Fracture of one rib, right side, subsequent encounter for fracture with routine healing: Secondary | ICD-10-CM | POA: Diagnosis not present

## 2022-08-11 ENCOUNTER — Ambulatory Visit: Payer: Medicare Other | Admitting: Physical Medicine & Rehabilitation

## 2022-08-12 DIAGNOSIS — H353131 Nonexudative age-related macular degeneration, bilateral, early dry stage: Secondary | ICD-10-CM | POA: Diagnosis not present

## 2022-08-12 DIAGNOSIS — H5310 Unspecified subjective visual disturbances: Secondary | ICD-10-CM | POA: Diagnosis not present

## 2022-08-19 ENCOUNTER — Other Ambulatory Visit: Payer: Self-pay | Admitting: Physical Medicine & Rehabilitation

## 2022-08-21 DIAGNOSIS — H5340 Unspecified visual field defects: Secondary | ICD-10-CM | POA: Diagnosis not present

## 2022-08-25 ENCOUNTER — Encounter: Payer: Self-pay | Admitting: Oncology

## 2022-08-26 NOTE — Progress Notes (Signed)
Care Management & Coordination Services Pharmacy Note  09/04/2022 Name:  Mark Jackson. MRN:  [id number] DOB:  [birth year]  Summary: PharmD FU visit.  Patient lipids improved after he resumed Zetia.  He did agree to start Fluoxetine reports today the he sees no improvement with this addition.  Counseled on taking full 4-6 weeks for improvement.  Patient agrees to continue has FU visit with PCP mid Feb to assess at that time.  Recommendations/Changes made from today's visit: Consider dose titration of fluoxetine at PCP visit if needed  Follow up plan: FU 6 months   Subjective: Mark Jackson. is an 89 y.o. year old male who is a primary patient of [name], [name], MD.  The care coordination team was consulted for assistance with disease management and care coordination needs.    Engaged with patient by telephone for follow up visit.  Recent office visits:  05/07/2022 OV (PCP) [name], MD; he did not restart Zetia-instructed him to restart again today and recent medication   Recent consult visits:  None   Hospital visits:  05/09/2022 ED visit for COVID-19 -no medication changes due to GFR   Objective:  Lab Results  Component Value Date   CREATININE 2.82 (H) 07/16/2022   BUN 43 (H) 07/16/2022   GFR 19.17 (L) 07/16/2022   GFRNONAA 22 (L) 06/14/2022   GFRAA 21 11/15/2020   NA 142 07/16/2022   K 4.8 07/16/2022   CALCIUM 8.2 (L) 07/16/2022   CO2 25 07/16/2022   GLUCOSE 98 07/16/2022    Lab Results  Component Value Date/Time   HGBA1C 5.8 (H) 12/27/2021 02:45 AM   HGBA1C 5.8 12/11/2021 11:17 AM   GFR 19.17 (L) 07/16/2022 12:04 PM   GFR 16.96 (L) 03/05/2022 02:21 PM    Last diabetic Eye exam: No results found for: "HMDIABEYEEXA"  Last diabetic Foot exam: No results found for: "HMDIABFOOTEX"   Lab Results  Component Value Date   CHOL 132 12/27/2021   HDL 39 (L) 12/27/2021   LDLCALC 80 12/27/2021   LDLDIRECT 38.0 07/16/2022   TRIG 63 12/27/2021    CHOLHDL 3.4 12/27/2021       Latest Ref Rng & Units 07/16/2022   12:04 PM 06/14/2022    9:50 AM 03/05/2022    2:21 PM  Hepatic Function  Total Protein 6.0 - 8.3 g/dL 6.3  6.6  6.4   Albumin 3.5 - 5.2 g/dL 3.6  3.5  3.6   AST 0 - 37 U/L '18  24  17   '$ ALT 0 - 53 U/L '10  15  10   '$ Alk Phosphatase 39 - 117 U/L 69  59  57   Total Bilirubin 0.2 - 1.2 mg/dL 0.4  0.6  0.4     Lab Results  Component Value Date/Time   TSH 3.03 03/05/2022 02:21 PM   TSH 3.970 12/11/2020 12:33 PM       Latest Ref Rng & Units 07/16/2022   12:04 PM 06/14/2022    9:50 AM 05/09/2022    3:21 PM  CBC  WBC 4.0 - 10.5 K/uL 12.6  14.6  9.7   Hemoglobin 13.0 - 17.0 g/dL 10.7  11.4  10.9   Hematocrit 39.0 - 52.0 % 31.8  34.6  32.9   Platelets 150.0 - 400.0 K/uL 133.0  128  124     Lab Results  Component Value Date/Time   VITAMINB12 533 03/05/2022 02:21 PM   VITAMINB12 275 12/11/2021 11:17 AM  Clinical ASCVD: No  The ASCVD Risk score (Arnett DK, et al., 2019) failed to calculate for the following reasons:   The 2019 ASCVD risk score is only valid for ages 49 to 35   The patient has a prior MI or stroke diagnosis       07/28/2022   11:08 AM 07/16/2022   11:02 AM 05/19/2022   10:56 AM  Depression screen PHQ 2/9  Decreased Interest 1 0 0  Down, Depressed, Hopeless 1 0 0  PHQ - 2 Score 2 0 0  Altered sleeping  0   Tired, decreased energy  0   Change in appetite  0   Feeling bad or failure about yourself   0   Trouble concentrating  0   Moving slowly or fidgety/restless  0   Suicidal thoughts  0   PHQ-9 Score  0      Social History   Tobacco Use  Smoking Status Former   Types: Pipe, Cigars   Quit date: 08/10/1968   Years since quitting: 54.1  Smokeless Tobacco Never   BP Readings from Last 3 Encounters:  07/21/22 (!) 149/81  07/16/22 (!) 150/76  06/14/22 (!) 179/82   Pulse Readings from Last 3 Encounters:  07/21/22 79  07/16/22 93  06/14/22 94   Wt Readings from Last 3 Encounters:   07/28/22 192 lb (87.1 kg)  07/21/22 191 lb 3.2 oz (86.7 kg)  07/16/22 189 lb 3.2 oz (85.8 kg)   BMI Readings from Last 3 Encounters:  07/28/22 29.19 kg/m  07/21/22 29.07 kg/m  07/16/22 28.77 kg/m    No Known Allergies  Medications Reviewed Today     Reviewed by Charlett Blake, MD (Physician) on 07/28/22 at 14  Med List Status: <None>   Medication Order Taking? Sig Documenting Provider Last Dose Status Informant  acetaminophen (TYLENOL) 325 MG tablet [id number] Yes Take 2 tablets (650 mg total) by mouth every 4 (four) hours as needed for mild pain (or temp > 37.5 C (99.5 F)). Cathlyn Parsons, PA-C Taking Active   amoxicillin (AMOXIL) 500 MG capsule [id number] No For pre dental procedures  Patient not taking: Reported on 07/28/2022   [provider] Not Taking Active   apixaban (ELIQUIS) 2.5 MG TABS tablet [id number] Yes Take 1 tablet (2.5 mg total) by mouth 2 (two) times daily. Cathlyn Parsons, PA-C Taking Active   diclofenac Sodium (VOLTAREN) 1 % GEL [id number] Yes Apply 2 g topically 4 (four) times daily. AngiulliLavon Paganini, PA-C Taking Active   diltiazem (CARDIZEM CD) 240 MG 24 hr capsule [id number] Yes Take 1 capsule (240 mg total) by mouth daily. [name], MD Taking Active   ezetimibe (ZETIA) 10 MG tablet [id number] Yes Take 1 tablet (10 mg total) by mouth daily. [name], MD Taking Active   ferrous sulfate 325 (65 FE) MG tablet [id number] Yes Take 325 mg by mouth daily with breakfast. [provider] Taking Active Child           Med Note Jeananne Rama, Brendolyn Patty Jan 01, 2022 11:28 AM) PTA med from prior admission carried over for CIR discharge reconciliation  FLUoxetine (PROZAC) 10 MG capsule [id number] Yes Take 1 capsule (10 mg total) by mouth daily. Jennye Boroughs, MD Taking Active   furosemide (LASIX) 40 MG tablet [id number] Yes Take 1 tablet (40 mg total) by mouth daily. [name], MD Taking Active   isosorbide mononitrate  (IMDUR) 60 MG 24 hr tablet [id number]  Yes TAKE ONE TAB DAILY AFTER BREAKFAST. MAY TAKE AN ADDITONAL TAB IN EVENING X2DAYS IF USE NITROGLYCER Angiulli, Lavon Paganini, PA-C Taking Active   Multiple Vitamins-Minerals (CENTRUM SILVER ADULT 50+) TABS [id number] Yes Take 1 tablet by mouth daily. [provider] Taking Active Child  nitroGLYCERIN (NITROSTAT) 0.4 MG SL tablet [id number] Yes ONE TABLET UNDER TONGUE WHEN NEEDED FOR CHEST PAIN. MAY REPEAT IN 5 MINUTES.  Patient taking differently: Place 0.4 mg under the tongue every 5 (five) minutes as needed for chest pain.   [name], MD Taking Active Child, Pharmacy Records  rosuvastatin (CRESTOR) 20 MG tablet [id number] Yes TAKE (1) TABLET DAILY AT BEDTIME. [name], MD Taking Active   topiramate (TOPAMAX) 100 MG tablet [id number] Yes Take 1 tablet (100 mg total) by mouth 2 (two) times daily. Kirsteins, Luanna Salk, MD Taking Active   traMADol Veatrice Bourbon) 50 MG tablet [id number] Yes Take 50 mg by mouth 3 (three) times daily as needed. [name], MD Taking Active             SDOH:  (Social Determinants of Health) assessments and interventions performed: No, done within the year  Financial Resource Strain: Low Risk  (11/28/2021)   Overall Financial Resource Strain (CARDIA)    Difficulty of Paying Living Expenses: Not hard at all     [place]: No Food Insecurity (11/28/2021)  Housing: Low Risk  (11/28/2021)  Transportation Needs: No Transportation Needs (11/28/2021)  Depression (PHQ2-9): Low Risk  (07/28/2022)  Financial Resource Strain: Low Risk  (11/28/2021)  Physical Activity: Insufficiently Active (11/28/2021)  Social Connections: Moderately Integrated (11/28/2021)  Stress: No Stress Concern Present (11/28/2021)  Tobacco Use: Medium Risk (07/28/2022)    Medication Assistance: None required.  Patient affirms current coverage meets needs.  Medication Access: Within the past 30 days, how often has  patient missed a dose of medication? 0 Is a pillbox or other method used to improve adherence? No  Factors that may affect medication adherence? no barriers identified Are meds synced by current pharmacy? No  Are meds delivered by current pharmacy? No  Does patient experience delays in picking up medications due to transportation concerns? No   [facility] Reviewed: Is patient disadvantaged to use [facility]?: No  Current Rx insurance plan: [place] Name and location of Current pharmacy:  [facility], [place] [address] Phone: [phone] Fax: [phone]  [facility] services reviewed with patient today?: Yes  Patient requests to transfer care to [facility]?: No  Reason patient declined to change pharmacies: Loyalty to other pharmacy/Patient preference  Compliance/Adherence/Medication fill history: Star Rating Drugs:  Rosuvastatin 20 mg last filled 09/02/2022 30 DS     Care Gaps: Annual wellness visit in last year? No   Assessment/Plan      Interventions: 1:1 collaboration with [name], MD regarding development and update of comprehensive plan of care as evidenced by provider attestation and co-signature Inter-disciplinary care team collaboration (see longitudinal plan of care) Comprehensive medication review performed; medication list updated in electronic medical record  Hypertension (BP goal <140/90)  -Uncontrolled based on office BP, not checking at home -Current treatment: Isosorbide '60mg'$  24hr daily Appropriate, Query effective, ,  Diltiazem CD '120mg'$  daily Appropriate, Query effective, -Medications previously tried: Metoprolol XL  -Current home readings: not checking at home -Reports hypotensive/hypertensive symptoms - daily headaches (migraines) -Educated on BP goals and benefits  of medications for prevention of heart attack, stroke and kidney  damage; Importance of home blood pressure monitoring; Symptoms of hypotension and importance of maintaining adequate hydration; -Counseled to monitor BP at home as able, document, and provide log at future appointments -BP continues to be elevated, has migraines daily. -He has previously not been on ACE/ARB due to concerns of fatigue, last three office BP above goal.   Hyperlipidemia: (LDL goal < 70) 09/04/22 -Controlled -Current treatment: Rosuvastatin '20mg'$  daily Appropriate, Query effective,  Zetia '10mg'$  Appropriate, Effective, Safe, Accessible -Medications previously tried: none noted  -Educated on Cholesterol goals;  Benefits of statin for ASCVD risk reduction; Importance of limiting foods high in cholesterol; -He started back on Zetia.  LDL was 38 at last OV which improved after adding Zetia.  Stressed importance of adherence on CV risk reduction.  Still tolerating statin, no need for medication changes.  Continue routine lipid screenings.  Update 10/20/21 Last lipid panel was in 2021.  LDL was well controlled at this time.  Reports adherence to statin with no concerns. Recommended FU appt with PCP for labs and routine physical. Patient agreeable  Depression/Anxiety (Goal: Reduce symptoms) 09/04/22 -Not ideally controlled -Current treatment: Fluoxetine '10mg'$  Appropriate, Query effective, ,  -Medications previously tried/failed: none noted -PHQ9:     07/28/2022   11:08 AM 07/16/2022   11:02 AM 05/19/2022   10:56 AM  PHQ9 SCORE ONLY  PHQ-9 Total Score 2 0 0    -Educated on Benefits of medication for symptom control Length of time to see full benefit from medication. -Recommended to continue current medication He has FU with PCP mid February to assess medication and see if dose may need to be increased.  At this time he reports he has not seen any improvement in overall mood.  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  Atrial Fibrillation (Goal:  prevent stroke and major bleeding) -Controlled -CHADSVASC: 4 -Current treatment: Rate control: Diltiazem CD '120mg'$  24hr daily Appropriate, Effective, Safe, Accessible Anticoagulation: Eliquis 2.'5mg'$  BID Appropriate, Effective, Safe, Accessible -Medications previously tried: none noted -Home BP and HR readings: not checking at home  -Counseled on increased risk of stroke due to Afib and benefits of anticoagulation for stroke prevention; bleeding risk associated with Eliquis and importance of self-monitoring for signs/symptoms of bleeding; -He is on appropriate dose of Eliquis due to age and Scr. -Recommend f/u on labs at next visit with Dr. [name]. -Recommended to continue current medication  Update 10/20/21 Eliquis dose still appropriate based on renal function/age. Eliquis copay is affordable, no concerns at this time.  He is not monitoring BP at home.  Does mention occasional dizziness and headaches.  Occur randomly throughout the day no specific timing. Encouraged occasional BP checks if able. No further changes at this time.    Patient Goals/Self-Care Activities Patient will:  - take medications as prescribed focus on medication adherence by pill box check blood pressure as able, document, and provide at future appointments  Follow Up Plan: The care management team will reach out to the patient again over the next 120 days.            Beverly Milch, PharmD Clinical Pharmacist  [facility] [phone]

## 2022-08-31 ENCOUNTER — Other Ambulatory Visit: Payer: Self-pay

## 2022-08-31 DIAGNOSIS — I480 Paroxysmal atrial fibrillation: Secondary | ICD-10-CM

## 2022-08-31 DIAGNOSIS — Z7901 Long term (current) use of anticoagulants: Secondary | ICD-10-CM

## 2022-09-01 ENCOUNTER — Ambulatory Visit (INDEPENDENT_AMBULATORY_CARE_PROVIDER_SITE_OTHER): Payer: Medicare Other

## 2022-09-01 DIAGNOSIS — E538 Deficiency of other specified B group vitamins: Secondary | ICD-10-CM | POA: Diagnosis not present

## 2022-09-01 MED ORDER — CYANOCOBALAMIN 1000 MCG/ML IJ SOLN
1000.0000 ug | Freq: Once | INTRAMUSCULAR | Status: AC
  Administered 2022-09-01: 1000 ug via INTRAMUSCULAR

## 2022-09-01 NOTE — Progress Notes (Signed)
Pt in office for nurse visit due to vitamin B-12 deficiency. Administered 1000 mcg/mL Cyanocobalamin injection to patient in left deltoid and patient tolerated well with no issues.  

## 2022-09-02 ENCOUNTER — Telehealth: Payer: Self-pay

## 2022-09-02 DIAGNOSIS — Z7901 Long term (current) use of anticoagulants: Secondary | ICD-10-CM

## 2022-09-02 DIAGNOSIS — I480 Paroxysmal atrial fibrillation: Secondary | ICD-10-CM

## 2022-09-03 ENCOUNTER — Telehealth: Payer: Self-pay | Admitting: Pharmacist

## 2022-09-03 MED ORDER — APIXABAN 2.5 MG PO TABS: 2.5000 mg | ORAL_TABLET | Freq: Two times a day (BID) | ORAL | 3 refills | Status: DC

## 2022-09-03 NOTE — Progress Notes (Signed)
Care Management & Coordination Services Pharmacy Team  Reason for Encounter: Appointment Reminder  Contacted patient to confirm telephone appointment with Leata Mouse PharmD, on 09/04/2022 at 12 pm. Spoke with patient on 09/03/2022    Star Rating Drugs:  Rosuvastatin 20 mg last filled 09/02/2022 30 DS   Care Gaps: Annual wellness visit in last year? No   April D Calhoun, [place] Pharmacist Assistant [phone]

## 2022-09-03 NOTE — Telephone Encounter (Signed)
I refilled Eliquis

## 2022-09-04 ENCOUNTER — Ambulatory Visit: Payer: BLUE CROSS/BLUE SHIELD | Admitting: Pharmacist

## 2022-09-11 ENCOUNTER — Ambulatory Visit: Payer: BLUE CROSS/BLUE SHIELD | Admitting: Oncology

## 2022-09-11 ENCOUNTER — Inpatient Hospital Stay: Payer: Medicare Other | Attending: Adult Health

## 2022-09-11 ENCOUNTER — Inpatient Hospital Stay (HOSPITAL_BASED_OUTPATIENT_CLINIC_OR_DEPARTMENT_OTHER): Payer: Medicare Other | Admitting: Adult Health

## 2022-09-11 ENCOUNTER — Other Ambulatory Visit: Payer: BLUE CROSS/BLUE SHIELD

## 2022-09-11 ENCOUNTER — Other Ambulatory Visit: Payer: Self-pay

## 2022-09-11 VITALS — BP 161/79 | HR 69 | Temp 97.7°F | Resp 18 | Wt 190.2 lb

## 2022-09-11 DIAGNOSIS — Z8546 Personal history of malignant neoplasm of prostate: Secondary | ICD-10-CM | POA: Diagnosis not present

## 2022-09-11 DIAGNOSIS — D696 Thrombocytopenia, unspecified: Secondary | ICD-10-CM | POA: Diagnosis not present

## 2022-09-11 DIAGNOSIS — I5032 Chronic diastolic (congestive) heart failure: Secondary | ICD-10-CM | POA: Diagnosis not present

## 2022-09-11 DIAGNOSIS — I130 Hypertensive heart and chronic kidney disease with heart failure and stage 1 through stage 4 chronic kidney disease, or unspecified chronic kidney disease: Secondary | ICD-10-CM | POA: Insufficient documentation

## 2022-09-11 DIAGNOSIS — D539 Nutritional anemia, unspecified: Secondary | ICD-10-CM | POA: Insufficient documentation

## 2022-09-11 DIAGNOSIS — Z87891 Personal history of nicotine dependence: Secondary | ICD-10-CM | POA: Insufficient documentation

## 2022-09-11 DIAGNOSIS — N183 Chronic kidney disease, stage 3 unspecified: Secondary | ICD-10-CM | POA: Insufficient documentation

## 2022-09-11 DIAGNOSIS — Z85828 Personal history of other malignant neoplasm of skin: Secondary | ICD-10-CM | POA: Diagnosis not present

## 2022-09-11 DIAGNOSIS — E538 Deficiency of other specified B group vitamins: Secondary | ICD-10-CM | POA: Insufficient documentation

## 2022-09-11 DIAGNOSIS — D518 Other vitamin B12 deficiency anemias: Secondary | ICD-10-CM

## 2022-09-11 DIAGNOSIS — Z8041 Family history of malignant neoplasm of ovary: Secondary | ICD-10-CM | POA: Insufficient documentation

## 2022-09-11 LAB — CBC WITH DIFFERENTIAL (CANCER CENTER ONLY)
Abs Immature Granulocytes: 0.03 10*3/uL (ref 0.00–0.07)
Basophils Absolute: 0.1 10*3/uL (ref 0.0–0.1)
Basophils Relative: 1 %
Eosinophils Absolute: 0.3 10*3/uL (ref 0.0–0.5)
Eosinophils Relative: 2 %
HCT: 33.8 % — ABNORMAL LOW (ref 39.0–52.0)
Hemoglobin: 11.2 g/dL — ABNORMAL LOW (ref 13.0–17.0)
Immature Granulocytes: 0 %
Lymphocytes Relative: 14 %
Lymphs Abs: 1.6 10*3/uL (ref 0.7–4.0)
MCH: 34.3 pg — ABNORMAL HIGH (ref 26.0–34.0)
MCHC: 33.1 g/dL (ref 30.0–36.0)
MCV: 103.4 fL — ABNORMAL HIGH (ref 80.0–100.0)
Monocytes Absolute: 1.2 10*3/uL — ABNORMAL HIGH (ref 0.1–1.0)
Monocytes Relative: 10 %
Neutro Abs: 8.9 10*3/uL — ABNORMAL HIGH (ref 1.7–7.7)
Neutrophils Relative %: 73 %
Platelet Count: 127 10*3/uL — ABNORMAL LOW (ref 150–400)
RBC: 3.27 MIL/uL — ABNORMAL LOW (ref 4.22–5.81)
RDW: 12.3 % (ref 11.5–15.5)
WBC Count: 12.1 10*3/uL — ABNORMAL HIGH (ref 4.0–10.5)
nRBC: 0 % (ref 0.0–0.2)
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
## 2022-09-15 ENCOUNTER — Encounter: Payer: Self-pay | Admitting: Oncology

## 2022-09-15 NOTE — Assessment & Plan Note (Signed)
Mark Jackson is a 89 year old male with several comorbidities here today for follow-up of his macrocytic anemia.  His hemoglobin today is 11.2.  His platelets are 127 and his white count is 12.1.  His labs remain stable and though slightly abnormal no intervention is indicated at this time.  I reviewed the above with [name] in detail who agrees with continued monitoring.  He will return in 6 months for labs and follow-up.

## 2022-09-23 ENCOUNTER — Ambulatory Visit: Payer: Medicare Other | Admitting: Family Medicine

## 2022-09-23 ENCOUNTER — Encounter: Payer: Self-pay | Admitting: Family Medicine

## 2022-09-23 VITALS — BP 130/78 | HR 72 | Temp 97.8°F | Ht 68.0 in | Wt 188.6 lb

## 2022-09-23 DIAGNOSIS — D696 Thrombocytopenia, unspecified: Secondary | ICD-10-CM | POA: Diagnosis not present

## 2022-09-23 DIAGNOSIS — I10 Essential (primary) hypertension: Secondary | ICD-10-CM | POA: Diagnosis not present

## 2022-09-23 DIAGNOSIS — N184 Chronic kidney disease, stage 4 (severe): Secondary | ICD-10-CM | POA: Diagnosis not present

## 2022-09-23 DIAGNOSIS — E538 Deficiency of other specified B group vitamins: Secondary | ICD-10-CM

## 2022-09-23 DIAGNOSIS — I5032 Chronic diastolic (congestive) heart failure: Secondary | ICD-10-CM | POA: Diagnosis not present

## 2022-09-23 DIAGNOSIS — I779 Disorder of arteries and arterioles, unspecified: Secondary | ICD-10-CM | POA: Diagnosis not present

## 2022-09-23 DIAGNOSIS — G8191 Hemiplegia, unspecified affecting right dominant side: Secondary | ICD-10-CM | POA: Diagnosis not present

## 2022-09-23 DIAGNOSIS — I480 Paroxysmal atrial fibrillation: Secondary | ICD-10-CM

## 2022-09-23 DIAGNOSIS — E785 Hyperlipidemia, unspecified: Secondary | ICD-10-CM | POA: Diagnosis not present

## 2022-09-23 DIAGNOSIS — I25119 Atherosclerotic heart disease of native coronary artery with unspecified angina pectoris: Secondary | ICD-10-CM

## 2022-09-23 MED ORDER — CYANOCOBALAMIN 1000 MCG/ML IJ SOLN
1000.0000 ug | Freq: Once | INTRAMUSCULAR | Status: AC
  Administered 2022-09-23: 1000 ug via INTRAMUSCULAR

## 2022-09-23 MED ORDER — ROSUVASTATIN CALCIUM 10 MG PO TABS: ORAL_TABLET | ORAL | 3 refills | Status: DC

## 2022-09-23 NOTE — Progress Notes (Signed)
Pt tolerated b12 well.

## 2022-09-23 NOTE — Patient Instructions (Addendum)
Cholesterol in December 2023 look excellent at 46 for LDL-with kidney function staying on lower side we opted to reduce rosuvastatin from 20 mg to 10 mg and continue zetia 10 mg  Sign release of information at the check out desk for last year of records from Dr. Moshe Cipro  low b12- to minimize trips we will do injection today and he can schedule next in 5 weeks.   -cancel nurse visit for 09/30/22 on your way out  No other changes today- labs next visit  Recommended follow up: Return in about 3 months (around 12/22/2022) for followup or sooner if needed.Schedule b4 you leave.

## 2022-09-23 NOTE — Addendum Note (Signed)
Addended by: Wyvonna Plum on: 09/23/2022 11:58 AM   Modules accepted: Orders

## 2022-09-23 NOTE — Progress Notes (Signed)
Phone [phone] In person visit   Subjective:   Mark Jackson. is a 89 y.o. year old very pleasant male patient who presents for/with See problem oriented charting Chief Complaint  Patient presents with   Follow-up    (MASK)   Hyperlipidemia    Past Medical History-  Patient Active Problem List   Diagnosis Date Noted   Right hemiparesis ([place]) 03/05/2022    Priority: High   Chronic diastolic heart failure ([place]) 12/12/2019    Priority: High   S/P TAVR (transcatheter aortic valve replacement) 12/12/2019    Priority: High   CAD S/P DES PCI to proximal LAD 03/22/2015    Priority: High   Paroxysmal atrial fibrillation ([place]) 08/20/2014    Priority: High    Class: Diagnosis of   Chronic kidney disease (CKD), active medical management without dialysis, stage 4 (severe) ([place]) 08/20/2014    Priority: High   Personal history of prostate cancer 10/22/2008    Priority: High   Hyperglycemia 09/27/2017    Priority: Medium    B12 deficiency 01/06/2017    Priority: Medium    BPH associated with nocturia 06/15/2016    Priority: Medium    Aortic stenosis 03/14/2015    Priority: Medium    Hereditary and idiopathic peripheral neuropathy 01/12/2014    Priority: Medium    H/O syncope 09/03/2013    Priority: Medium    Right-sided carotid artery disease ([place]) 03/02/2013    Priority: Medium    Hyperlipidemia with target LDL less than 70 03/02/2013    Priority: Medium    Migraine without aura 10/26/2012    Priority: Medium    Macrocytic anemia 10/23/2008    Priority: Medium    Essential hypertension 10/22/2008    Priority: Medium    Excessive daytime sleepiness 10/22/2008    Priority: Medium    Thrombocytopenia, unspecified ([place]) 12/11/2021    Priority: Low   Syncope and collapse 11/27/2019    Priority: Low   Fatigue 11/08/2017    Priority: Low   Chronic diarrhea 01/06/2017    Priority: Low   Perianal dermatitis 06/19/2015    Priority: Low   Rectal bleeding 04/25/2015     Priority: Low   Dyspnea 08/31/2014    Priority: Low   Long term current use of anticoagulant therapy 08/27/2014    Priority: Low   GLAUCOMA 10/23/2008    Priority: Low   Hemorrhoids 10/22/2008    Priority: Low   Arthropathy 10/22/2008    Priority: Low   History of colonic polyps 10/22/2008    Priority: Low   Gait disturbance, post-stroke 07/28/2022   B12 neuropathy ([place]) 07/28/2022   Situational depression 03/05/2022   Loud snoring 02/04/2022   Left thalamic infarction ([place]) 12/29/2021   Hematuria 12/11/2020   Bilateral lower extremity edema 04/15/2017   Crescendo angina ([place]) 06/01/2016    Medications- reviewed and updated Current Outpatient Medications  Medication Sig Dispense Refill   acetaminophen (TYLENOL) 325 MG tablet Take 2 tablets (650 mg total) by mouth every 4 (four) hours as needed for mild pain (or temp > 37.5 C (99.5 F)).     apixaban (ELIQUIS) 2.5 MG TABS tablet Take 1 tablet (2.5 mg total) by mouth 2 (two) times daily. 180 tablet 3   diclofenac Sodium (VOLTAREN) 1 % GEL Apply 2 g topically 4 (four) times daily. 2 g 0   diltiazem (CARDIZEM CD) 240 MG 24 hr capsule Take 1 capsule (240 mg total) by mouth daily. 90 capsule 3   ezetimibe (  ZETIA) 10 MG tablet Take 1 tablet (10 mg total) by mouth daily. 90 tablet 3   ferrous sulfate 325 (65 FE) MG tablet Take 325 mg by mouth daily with breakfast.     FLUoxetine (PROZAC) 10 MG capsule Take 1 capsule (10 mg total) by mouth daily. 30 capsule 2   furosemide (LASIX) 40 MG tablet Take 1 tablet (40 mg total) by mouth daily. 90 tablet 3   isosorbide mononitrate (IMDUR) 60 MG 24 hr tablet TAKE ONE TAB DAILY AFTER BREAKFAST. MAY TAKE AN ADDITONAL TAB IN EVENING X2DAYS IF USE NITROGLYCER 120 tablet 3   Multiple Vitamins-Minerals (CENTRUM SILVER ADULT 50+) TABS Take 1 tablet by mouth daily.     nitroGLYCERIN (NITROSTAT) 0.4 MG SL tablet ONE TABLET UNDER TONGUE WHEN NEEDED FOR CHEST PAIN. MAY REPEAT IN 5 MINUTES. 25 tablet 0    topiramate (TOPAMAX) 100 MG tablet Take 1 tablet (100 mg total) by mouth 2 (two) times daily. 60 tablet 3   traMADol (ULTRAM) 50 MG tablet Take 50 mg by mouth 3 (three) times daily as needed.     amoxicillin (AMOXIL) 500 MG capsule For pre dental procedures (Patient not taking: Reported on 09/23/2022)     rosuvastatin (CRESTOR) 10 MG tablet TAKE (1) TABLET DAILY AT BEDTIME. 90 tablet 3   No current facility-administered medications for this visit.     Objective:  BP 130/78   Pulse 72   Temp 97.8 F (36.6 C)   Ht 5' 8"$  (1.727 m)   Wt 188 lb 9.6 oz (85.5 kg)   SpO2 97%   BMI 28.68 kg/m  Gen: NAD, resting comfortably CV: RRR no murmurs rubs or gallops Lungs: CTAB no crackles, wheeze, rhonchi Ext: trace to 1+ edema Skin: warm, dry Walks with cane, right sided weakness noted on grip strength    Assessment and Plan   #Influenza- started on Saturday of this week but he has done reasonably well- in recovery process now  ##CAD status post DES PCI to proximal LAD- sees Dr. Ellyn Hack #History of CVA-Dec 26 2021 hospitalization with right hemiparesis (left thalamic infarction and small left cerebellar infarct thalamic CVA most likely SVD although 2 locations suggests cardioembolic) #hyperlipidemia #Right internal carotid artery stenosis- last checked 12/27/21 and right ICA 60-79% stenosis.  S: Medication:Aspirin was not added as patient already on Eliquis during May 2023 stroke hospitalization-and remains on Eliquis alone 2.5 mg BID, Crestor 20 mg, Zetia 10 mg added after May 2023 stroke -also on imdur  No chest pain or shortness of breath.  -right sided weakness and gets some right leg jerks still- but tolerating.  -Strength- working on using 4 pronged cane instead of walker as of last week and still working on confidence with this- no falls in recent memory Lab Results  Component Value Date   CHOL 132 12/27/2021   HDL 39 (L) 12/27/2021   LDLCALC 80 12/27/2021   LDLDIRECT 38.0  07/16/2022   TRIG 63 12/27/2021   CHOLHDL 3.4 12/27/2021    A/P: CAD asymptomatic continue current medications . Imdur preventing angina - Cholesterol in December 2023 look excellent at 4 for LDL-with kidney function staying on lower side we opted to reduce rosuvastatin from 20 mg to 10 mg and continue zetia 10 mg. Plan to recheck next visit -Saw Dr. Tomi Likens in December who recommended follow-up with vascular surgery for routine monitoring of ICA stenosis- it does not seem like he has seen them since 2021 that I can tell- we discussed I could  order follow up carotid US next visit -advised likely needed more strength if wanted to drive independently- offered OT eval but he declines for now  # Atrial fibrillation S: Rate controlled with diltiazem 231m XR  Anticoagulated with eliquis 2.5 mg BID A/P: appropriately anticoagulated and rate controlled- continue current medicine   #CKD IV- sees Dr. GTrilby Drummer not interested in dialysis  #hypertension #CHF S: medication: Imdur 60 mg, diltiazem 240 mg extended release, Lasix 40 mg -slightly better edema trace to 1+ and down 1 lb from last visit here Home readings #s: Does not check regularly BP Readings from Last 3 Encounters:  09/23/22 130/78  09/11/22 (!) 161/79  07/21/22 (!) 149/81  A/P: CKD IV reports was stable in stage IV range recently with cFrancekidney- we also have this on record from 07/16/22 and GFR was19 Blood pressure- stable- continue current medicines  CHF- stable- continue current medicines-glad weight and edema down slightly  # B12 deficiency S: Current treatment/medication (oral vs. IM): Had stopped B12  but restarted in December- had shot 120/20/23 and 09/01/22 Lab Results  Component Value Date   VITAMINB12 533 03/05/2022  A/P: low b12- to minimize trips we will do injection today and he can schedule next in 5 weeks.    # Macrocytic anemia/thrombocytopenia-following with oncology with visit just about 10 days ago and  overall stable-plan was to continue to monitor  # Depression S: Medication:prozac 10 mg. Feels has done reasonably well on this. Low motivation but does not feel depressed per his reports- has been hard with illness in last year though A/P: reasonable control- wants to continue current medications   #migraines- remains on topamax thorugh Dr. KElla Bodo with renal functoin considered reducing dose  Recommended follow up: Return in about 3 months (around 12/22/2022) for followup or sooner if needed.Schedule b4 you leave. Future Appointments  Date Time Provider [facility] 09/30/2022  2:00 PM [facility] NURSE [facility] PEC  12/11/2022  1:00 PM [facility] HEALTH COACH [facility]EC  01/20/2023  1:50 PM JTomi Likens Adam R, DO [facility] None  01/28/2023 11:30 AM Kirsteins, ALuanna Salk MD [facility] CPR  03/11/2023  2:00 PM DEdythe Clarity RPH [facility] None  03/12/2023 11:15 AM [facility] LAB [facility] None  03/12/2023 11:45 AM Causey, LCharlestine Massed NP [facility] None   Lab/Order associations:   ICD-10-CM   1. CAD S/P DES PCI to proximal LAD  I25.119     2. Chronic diastolic heart failure (HCC)  I50.32     3. Chronic kidney disease (CKD), active medical management without dialysis, stage 4 (severe) (HCC)  N18.4     4. Paroxysmal atrial fibrillation (HCC)  I48.0     5. Right hemiparesis ([place]  G81.91     6. Right-sided carotid artery disease, unspecified type ([place]  I77.9     7. Thrombocytopenia, unspecified (HCC) Chronic D69.6     8. Hyperlipidemia with target LDL less than 70  E78.5     9. Essential hypertension  I10     10. B12 deficiency  E53.8       Meds ordered this encounter  Medications   rosuvastatin (CRESTOR) 10 MG tablet    Sig: TAKE (1) TABLET DAILY AT BEDTIME.    Dispense:  90 tablet    Refill:  3    Return precautions advised.  SGarret Reddish MD

## 2022-09-30 ENCOUNTER — Ambulatory Visit: Payer: Medicare Other

## 2022-10-29 ENCOUNTER — Ambulatory Visit (INDEPENDENT_AMBULATORY_CARE_PROVIDER_SITE_OTHER): Payer: Medicare Other

## 2022-10-29 DIAGNOSIS — E538 Deficiency of other specified B group vitamins: Secondary | ICD-10-CM

## 2022-10-29 MED ORDER — CYANOCOBALAMIN 1000 MCG/ML IJ SOLN
1000.0000 ug | Freq: Once | INTRAMUSCULAR | Status: AC
  Administered 2022-10-29: 1000 ug via INTRAMUSCULAR

## 2022-10-29 NOTE — Progress Notes (Signed)
Pt received b12 in left deltoid, pt tolerated well 

## 2022-11-12 ENCOUNTER — Other Ambulatory Visit: Payer: Self-pay

## 2022-11-12 MED ORDER — ISOSORBIDE MONONITRATE ER 60 MG PO TB24: ORAL_TABLET | ORAL | 3 refills | Status: DC

## 2022-11-18 ENCOUNTER — Inpatient Hospital Stay: Payer: Medicare Other | Attending: Adult Health | Admitting: Genetic Counselor

## 2022-11-18 ENCOUNTER — Other Ambulatory Visit: Payer: Self-pay | Admitting: Genetic Counselor

## 2022-11-18 ENCOUNTER — Other Ambulatory Visit: Payer: Self-pay

## 2022-11-18 ENCOUNTER — Other Ambulatory Visit: Payer: Medicare Other

## 2022-11-18 ENCOUNTER — Inpatient Hospital Stay: Payer: Medicare Other

## 2022-11-18 DIAGNOSIS — Z8546 Personal history of malignant neoplasm of prostate: Secondary | ICD-10-CM

## 2022-11-18 DIAGNOSIS — Z8481 Family history of carrier of genetic disease: Secondary | ICD-10-CM | POA: Diagnosis not present

## 2022-11-18 LAB — GENETIC SCREENING ORDER

## 2022-11-19 ENCOUNTER — Encounter: Payer: Self-pay | Admitting: Oncology

## 2022-11-19 ENCOUNTER — Encounter: Payer: Self-pay | Admitting: Genetic Counselor

## 2022-11-19 ENCOUNTER — Other Ambulatory Visit: Payer: Self-pay | Admitting: Physical Medicine & Rehabilitation

## 2022-11-19 ENCOUNTER — Other Ambulatory Visit: Payer: Self-pay | Admitting: Family Medicine
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
## 2022-11-25 ENCOUNTER — Telehealth: Payer: Self-pay | Admitting: Pharmacist

## 2022-11-25 ENCOUNTER — Telehealth: Payer: Self-pay | Admitting: Family Medicine

## 2022-11-25 NOTE — Progress Notes (Addendum)
Care Management & Coordination Services Pharmacy Team  Reason for Encounter: General adherence update   Contacted patient for general health update and medication adherence call.  Spoke with patient on 11/25/2022    What concerns do you have about your medications? None  The patient denies side effects with their medications.   How often do you forget or accidentally miss a dose? Rarely  Do you use a pillbox? Yes  Are you having any problems getting your medications from your pharmacy? No  Has the cost of your medications been a concern? No If yes, what medication and is patient assistance available or has it been applied for?  Since last visit with PharmD, no interventions have been made.   The patient has not had an ED visit since last contact.   The patient denies problems with their health.   Patient denies concerns or questions for Erskine Emery, PharmD at this time.   Counseled patient on: Access to carecoordination team for any cost, medication or pharmacy concerns.  -Patient states he is still taking Fluoxetine.   Chart Updates:  Recent office visits:  09/23/2022 OV (PCP) Shelva Majestic, MD; with kidney function staying on lower side we opted to reduce rosuvastatin from 20 mg to 10 mg and continue zetia 10 mg.   Recent consult visits:  09/11/2022 OV (Oncology) Loa Socks, NP; no medication changes indicated.  Hospital visits:  None in previous 6 months  Medications: Outpatient Encounter Medications as of 11/25/2022  Medication Sig Note   acetaminophen (TYLENOL) 325 MG tablet Take 2 tablets (650 mg total) by mouth every 4 (four) hours as needed for mild pain (or temp > 37.5 C (99.5 F)).    amoxicillin (AMOXIL) 500 MG capsule For pre dental procedures (Patient not taking: Reported on 09/23/2022)    apixaban (ELIQUIS) 2.5 MG TABS tablet Take 1 tablet (2.5 mg total) by mouth 2 (two) times daily.    diclofenac Sodium (VOLTAREN) 1 % GEL Apply 2 g  topically 4 (four) times daily.    diltiazem (CARDIZEM CD) 240 MG 24 hr capsule Take 1 capsule (240 mg total) by mouth daily.    ezetimibe (ZETIA) 10 MG tablet Take 1 tablet (10 mg total) by mouth daily.    ferrous sulfate 325 (65 FE) MG tablet Take 325 mg by mouth daily with breakfast. 01/01/2022: PTA med from prior admission carried over for CIR discharge reconciliation   FLUoxetine (PROZAC) 10 MG capsule Take 1 capsule (10 mg total) by mouth daily.    furosemide (LASIX) 40 MG tablet Take 1 tablet (40 mg total) by mouth daily.    isosorbide mononitrate (IMDUR) 60 MG 24 hr tablet TAKE ONE TAB DAILY AFTER BREAKFAST. MAY TAKE AN ADDITONAL TAB IN EVENING X2DAYS IF USE NITROGLYCER    Multiple Vitamins-Minerals (CENTRUM SILVER ADULT 50+) TABS Take 1 tablet by mouth daily.    nitroGLYCERIN (NITROSTAT) 0.4 MG SL tablet ONE TABLET UNDER TONGUE WHEN NEEDED FOR CHEST PAIN. MAY REPEAT IN 5 MINUTES.    rosuvastatin (CRESTOR) 10 MG tablet TAKE (1) TABLET DAILY AT BEDTIME.    topiramate (TOPAMAX) 100 MG tablet Take 1 tablet (100 mg total) by mouth 2 (two) times daily.    traMADol (ULTRAM) 50 MG tablet Take 50 mg by mouth 3 (three) times daily as needed.    No facility-administered encounter medications on file as of 11/25/2022.    Recent vitals BP Readings from Last 3 Encounters:  09/23/22 130/78  09/11/22 (!) 161/79  07/21/22 Marland Kitchen)  149/81   Pulse Readings from Last 3 Encounters:  09/23/22 72  09/11/22 69  07/21/22 79   Wt Readings from Last 3 Encounters:  09/23/22 188 lb 9.6 oz (85.5 kg)  09/11/22 190 lb 4 oz (86.3 kg)  07/28/22 192 lb (87.1 kg)   BMI Readings from Last 3 Encounters:  09/23/22 28.68 kg/m  09/11/22 28.93 kg/m  07/28/22 29.19 kg/m    Recent lab results    Component Value Date/Time   NA 142 07/16/2022 1204   NA 142 11/15/2020 0000   K 4.8 07/16/2022 1204   CL 111 07/16/2022 1204   CO2 25 07/16/2022 1204   GLUCOSE 98 07/16/2022 1204   BUN 43 (H) 07/16/2022 1204   BUN  49 (A) 11/15/2020 0000   CREATININE 2.82 (H) 07/16/2022 1204   CREATININE 2.97 (H) 07/19/2020 1401   CALCIUM 8.2 (L) 07/16/2022 1204    Lab Results  Component Value Date   CREATININE 2.82 (H) 07/16/2022   GFR 19.17 (L) 07/16/2022   GFRNONAA 22 (L) 06/14/2022   GFRAA 21 11/15/2020   Lab Results  Component Value Date/Time   HGBA1C 5.8 (H) 12/27/2021 02:45 AM   HGBA1C 5.8 12/11/2021 11:17 AM    Lab Results  Component Value Date   CHOL 132 12/27/2021   HDL 39 (L) 12/27/2021   LDLCALC 80 12/27/2021   LDLDIRECT 38.0 07/16/2022   TRIG 63 12/27/2021   CHOLHDL 3.4 12/27/2021    Care Gaps: Annual wellness visit in last year? Yes  Star Rating Drugs:  Rosuvastatin 10 mg last filled 10/30/2022 30 DS   Future Appointments  Date Time Provider Department Center  12/01/2022 10:30 AM [facility] NURSE [facility]  12/11/2022  1:00 PM [facility] ANNUAL WELLNESS VISIT 1 [facility]  12/24/2022 11:00 AM Shelva Majestic, MD [facility]  01/20/2023  1:50 PM Drema Dallas, DO [facility] None  01/28/2023 11:30 AM Kirsteins, Victorino Sparrow, MD [facility] CPR  03/11/2023  2:00 PM Erroll Luna, [facility] None  03/12/2023 11:15 AM [facility] None  03/12/2023 11:45 AM Causey, Larna Daughters, NP [facility] None   April D Calhoun, [facility] Clinical Pharmacist Assistant [phone]

## 2022-11-25 NOTE — Telephone Encounter (Signed)
Contacted [name] to schedule their annual wellness visit. Appointment made for 11/26/2022.  Gabriel Cirri [facility] AWV TEAM Direct Dial [phone]

## 2022-11-26 ENCOUNTER — Ambulatory Visit (INDEPENDENT_AMBULATORY_CARE_PROVIDER_SITE_OTHER): Payer: Medicare Other

## 2022-11-26 VITALS — Wt 188.0 lb

## 2022-11-26 DIAGNOSIS — Z0000 Encounter for general adult medical examination without abnormal findings: Secondary | ICD-10-CM | POA: Diagnosis not present

## 2022-11-26 NOTE — Patient Instructions (Signed)
Mark Jackson , Thank you for taking time to come for your Medicare Wellness Visit. I appreciate your ongoing commitment to your health goals. Please review the following plan we discussed and let me know if I can assist you in the future.   These are the goals we discussed:  Goals      patient     Maintain health Walks x 3 to 4 times a week     Patient Stated     Long term care planning resources to be reviewed      Patient Stated     None at this time     Patient Stated     None at this time      Patient Stated     None at this time      PharmD Care Plan     CARE PLAN ENTRY (see longitudinal plan of care for additional care plan information)  Current Barriers:  Chronic Disease Management support, education, and care coordination needs related to Hypertension, Hyperlipidemia, and Atrial Fibrillation   Hypertension BP Readings from Last 3 Encounters:  07/19/20 136/71  06/17/20 134/72  04/07/20 137/85   Pharmacist Clinical Goal(s): Over the next 365 days, patient will work with PharmD and providers to maintain BP goal <140/90 Current regimen:  Isosorbide mononitrate 60 mg once daily after breakfast (coronary artery disease) Diltiazem CD 180 mg once daily (heart rate control in atrial fibrillation, some blood pressure lowering) Furosemide 40 mg - half tablet once daily (reduces fluid retention) Interventions: Reviewed diet and exercise - Maintain a healthy weight and exercise regularly, as directed by your health care provider. Eat healthy foods, such as: Lean proteins, complex carbohydrates, fresh fruits and vegetables, low-fat dairy products, healthy fats. Reviewed recommendations for home monitoring of blood pressure Patient self care activities - Over the next 365 days, patient will: Check BP at least once every 1-2 weeks, document, and provide at future appointments Ensure daily salt intake < 2300 mg/day  Hyperlipidemia Lab Results  Component Value Date/Time    LDLCALC 46 11/29/2019 02:24 AM   LDLDIRECT 52.0 09/12/2018 01:57 PM   Pharmacist Clinical Goal(s): Over the next 365 days, patient will work with PharmD and providers to maintain LDL goal < 70 Current regimen:  Rosuvastatin 20 mg once daily (cholesterol lowering, prevention of cardiovascular events) Interventions: Reviewed diet/exercise - see blood pressure section Patient self care activities - Over the next 365 days, patient will: Continue current management  Stroke prevention in atrial fibrillation Pharmacist Clinical Goal(s) Over the next 365 days, patient will work with PharmD and providers to ensure medication safety and affordability/accessibility Current regimen:  Eliquis 2.5 mg twice daily Interventions: Reviewed side effects - no issues noted Reviewed pharmacy services including pill packaging to help will missed doses - declined but will let pharmacist know if wanting to pursue. Patient self care activities - Over the next 365 days, patient will: Let pharmacist know if there are any cost concerns - would help apply for patient assistance if eligible  Medication management Pharmacist Clinical Goal(s): Over the next 365 days, patient will work with PharmD and providers to maintain optimal medication adherence Current pharmacy: [facility] Interventions Comprehensive medication review performed. Continue current medication management strategy. Patient self care activities - Over the next 365 days, patient will: Take medications as prescribed. Report any questions or concerns to PharmD and/or provider(s) Initial goal documentation.     Track and Manage My Blood Pressure-Hypertension     Timeframe:  Long-Range Goal Priority:  High Start Date:   04/15/21                          Expected End Date:  10/13/20                     Follow Up Date 07/15/21    - check blood pressure weekly - choose a place to take my blood pressure (home, clinic or office, retail  store)    Why is this important?   You won't feel high blood pressure, but it can still hurt your blood vessels.  High blood pressure can cause heart or kidney problems. It can also cause a stroke.  Making lifestyle changes like losing a little weight or eating less salt will help.  Checking your blood pressure at home and at different times of the day can help to control blood pressure.  If the doctor prescribes medicine remember to take it the way the doctor ordered.  Call the office if you cannot afford the medicine or if there are questions about it.     Notes:         This is a list of the screening recommended for you and due dates:  Health Maintenance  Topic Date Due   COVID-19 Vaccine (5 - 2023-24 season) 04/10/2022   Flu Shot  03/11/2023   Medicare Annual Wellness Visit  11/26/2023   DTaP/Tdap/Td vaccine (2 - Td or Tdap) 03/01/2029   Pneumonia Vaccine  Completed   Zoster (Shingles) Vaccine  Completed   HPV Vaccine  Aged Out    Advanced directives: Please bring a copy of your health care power of attorney and living will to the office at your convenience.  Conditions/risks identified: none at this time   Next appointment: Follow up in one year for your annual wellness visit.   Preventive Care 40-64 Years, Male Preventive care refers to lifestyle choices and visits with your health care provider that can promote health and wellness. What does preventive care include? A yearly physical exam. This is also called an annual well check. Dental exams once or twice a year. Routine eye exams. Ask your health care provider how often you should have your eyes checked. Personal lifestyle choices, including: Daily care of your teeth and gums. Regular physical activity. Eating a healthy diet. Avoiding tobacco and drug use. Limiting alcohol use. Practicing safe sex. Taking low-dose aspirin daily starting at age 76. Taking vitamin and mineral supplements as recommended by  your health care provider. What happens during an annual well check? The services and screenings done by your health care provider during your annual well check will depend on your age, overall health, lifestyle risk factors, and family history of disease. Counseling  Your health care provider may ask you questions about your: Alcohol use. Tobacco use. Drug use. Emotional well-being. Home and relationship well-being. Sexual activity. Eating habits. Work and work Astronomer. Method of birth control. Menstrual cycle. Pregnancy history. Screening  You may have the following tests or measurements: Height, weight, and BMI. Blood pressure. Lipid and cholesterol levels. These may be checked every 5 years, or more frequently if you are over 100 years old. Skin check. Lung cancer screening. You may have this screening every year starting at age 56 if you have a 30-pack-year history of smoking and currently smoke or have quit within the past 15 years. Fecal occult blood test (FOBT) of the stool. You  may have this test every year starting at age 55. Flexible sigmoidoscopy or colonoscopy. You may have a sigmoidoscopy every 5 years or a colonoscopy every 10 years starting at age 73. Hepatitis C blood test. Hepatitis B blood test. Sexually transmitted disease (STD) testing. Diabetes screening. This is done by checking your blood sugar (glucose) after you have not eaten for a while (fasting). You may have this done every 1-3 years. Mammogram. This may be done every 1-2 years. Talk to your health care provider about when you should start having regular mammograms. This may depend on whether you have a family history of breast cancer. BRCA-related cancer screening. This may be done if you have a family history of breast, ovarian, tubal, or peritoneal cancers. Pelvic exam and Pap test. This may be done every 3 years starting at age 48. Starting at age 94, this may be done every 5 years if you have a Pap  test in combination with an HPV test. Bone density scan. This is done to screen for osteoporosis. You may have this scan if you are at high risk for osteoporosis. Discuss your test results, treatment options, and if necessary, the need for more tests with your health care provider. Vaccines  Your health care provider may recommend certain vaccines, such as: Influenza vaccine. This is recommended every year. Tetanus, diphtheria, and acellular pertussis (Tdap, Td) vaccine. You may need a Td booster every 10 years. Zoster vaccine. You may need this after age 58. Pneumococcal 13-valent conjugate (PCV13) vaccine. You may need this if you have certain conditions and were not previously vaccinated. Pneumococcal polysaccharide (PPSV23) vaccine. You may need one or two doses if you smoke cigarettes or if you have certain conditions. Talk to your health care provider about which screenings and vaccines you need and how often you need them. This information is not intended to replace advice given to you by your health care provider. Make sure you discuss any questions you have with your health care provider. Document Released: 08/23/2015 Document Revised: 04/15/2016 Document Reviewed: 05/28/2015 Elsevier Interactive Patient Education  2017 [employer].    Fall Prevention in the Home Falls can cause injuries. They can happen to people of all ages. There are many things you can do to make your home safe and to help prevent falls. What can I do on the outside of my home? Regularly fix the edges of walkways and driveways and fix any cracks. Remove anything that might make you trip as you walk through a door, such as a raised step or threshold. Trim any bushes or trees on the path to your home. Use bright outdoor lighting. Clear any walking paths of anything that might make someone trip, such as rocks or tools. Regularly check to see if handrails are loose or broken. Make sure that both sides of any steps  have handrails. Any raised decks and porches should have guardrails on the edges. Have any leaves, snow, or ice cleared regularly. Use sand or salt on walking paths during winter. Clean up any spills in your garage right away. This includes oil or grease spills. What can I do in the bathroom? Use night lights. Install grab bars by the toilet and in the tub and shower. Do not use towel bars as grab bars. Use non-skid mats or decals in the tub or shower. If you need to sit down in the shower, use a plastic, non-slip stool. Keep the floor dry. Clean up any water that spills on the  floor as soon as it happens. Remove soap buildup in the tub or shower regularly. Attach bath mats securely with double-sided non-slip rug tape. Do not have throw rugs and other things on the floor that can make you trip. What can I do in the bedroom? Use night lights. Make sure that you have a light by your bed that is easy to reach. Do not use any sheets or blankets that are too big for your bed. They should not hang down onto the floor. Have a firm chair that has side arms. You can use this for support while you get dressed. Do not have throw rugs and other things on the floor that can make you trip. What can I do in the kitchen? Clean up any spills right away. Avoid walking on wet floors. Keep items that you use a lot in easy-to-reach places. If you need to reach something above you, use a strong step stool that has a grab bar. Keep electrical cords out of the way. Do not use floor polish or wax that makes floors slippery. If you must use wax, use non-skid floor wax. Do not have throw rugs and other things on the floor that can make you trip. What can I do with my stairs? Do not leave any items on the stairs. Make sure that there are handrails on both sides of the stairs and use them. Fix handrails that are broken or loose. Make sure that handrails are as long as the stairways. Check any carpeting to make  sure that it is firmly attached to the stairs. Fix any carpet that is loose or worn. Avoid having throw rugs at the top or bottom of the stairs. If you do have throw rugs, attach them to the floor with carpet tape. Make sure that you have a light switch at the top of the stairs and the bottom of the stairs. If you do not have them, ask someone to add them for you. What else can I do to help prevent falls? Wear shoes that: Do not have high heels. Have rubber bottoms. Are comfortable and fit you well. Are closed at the toe. Do not wear sandals. If you use a stepladder: Make sure that it is fully opened. Do not climb a closed stepladder. Make sure that both sides of the stepladder are locked into place. Ask someone to hold it for you, if possible. Clearly mark and make sure that you can see: Any grab bars or handrails. First and last steps. Where the edge of each step is. Use tools that help you move around (mobility aids) if they are needed. These include: Canes. Walkers. Scooters. Crutches. Turn on the lights when you go into a dark area. Replace any light bulbs as soon as they burn out. Set up your furniture so you have a clear path. Avoid moving your furniture around. If any of your floors are uneven, fix them. If there are any pets around you, be aware of where they are. Review your medicines with your doctor. Some medicines can make you feel dizzy. This can increase your chance of falling. Ask your doctor what other things that you can do to help prevent falls. This information is not intended to replace advice given to you by your health care provider. Make sure you discuss any questions you have with your health care provider. Document Released: 05/23/2009 Document Revised: 01/02/2016 Document Reviewed: 08/31/2014 Elsevier Interactive Patient Education  2017 [employer].
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
## 2022-12-01 ENCOUNTER — Ambulatory Visit: Payer: Medicare Other

## 2022-12-02 ENCOUNTER — Ambulatory Visit (INDEPENDENT_AMBULATORY_CARE_PROVIDER_SITE_OTHER): Payer: Medicare Other

## 2022-12-02 DIAGNOSIS — E538 Deficiency of other specified B group vitamins: Secondary | ICD-10-CM | POA: Diagnosis not present

## 2022-12-02 MED ORDER — CYANOCOBALAMIN 1000 MCG/ML IJ SOLN
1000.0000 ug | Freq: Once | INTRAMUSCULAR | Status: AC
  Administered 2022-12-02: 1000 ug via INTRAMUSCULAR

## 2022-12-02 NOTE — Progress Notes (Signed)
Pt here for B12 injection for Dr. Hunter. Injection given in left deltoid. Pt tolerated well.   

## 2022-12-09 DIAGNOSIS — I779 Disorder of arteries and arterioles, unspecified: Secondary | ICD-10-CM

## 2022-12-09 HISTORY — DX: Disorder of arteries and arterioles, unspecified: I77.9

## 2022-12-10 ENCOUNTER — Ambulatory Visit: Payer: Self-pay | Admitting: Genetic Counselor

## 2022-12-10 ENCOUNTER — Telehealth: Payer: Self-pay | Admitting: Genetic Counselor

## 2022-12-10 DIAGNOSIS — Z8546 Personal history of malignant neoplasm of prostate: Secondary | ICD-10-CM

## 2022-12-10 DIAGNOSIS — Z1379 Encounter for other screening for genetic and chromosomal anomalies: Secondary | ICD-10-CM

## 2022-12-10 DIAGNOSIS — Z8481 Family history of carrier of genetic disease: Secondary | ICD-10-CM

## 2022-12-10 NOTE — Telephone Encounter (Signed)
Revealed negative genetics to daughter Helmut Muster (per pt request).  VUS SDHB.  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

## 2022-12-22 ENCOUNTER — Encounter: Payer: Self-pay | Admitting: Oncology

## 2022-12-22 DIAGNOSIS — Z1379 Encounter for other screening for genetic and chromosomal anomalies: Secondary | ICD-10-CM | POA: Insufficient documentation

## 2022-12-22 NOTE — Progress Notes (Signed)
HPI:   Mark Jackson was previously seen in the [facility] due to a family history of an ATM gene mutation in his daughter, a personal history of prostate cancer, and concerns regarding a hereditary predisposition to cancer.    Mr. Cowell recent genetic test results were disclosed to his daughter by telephone. These results and recommendations are discussed in more detail below.  CANCER HISTORY:    Around the age of 72, Mr. Zindel was diagnosed with adenocarcinoma of the prostate.    FAMILY HISTORY:  We obtained a detailed, 4-generation family history.  Significant diagnoses are listed below:      Family History  Problem Relation Age of Onset   Uterine cancer Mother          dx unknown age   Testicular cancer Son 74   Other Daughter          ATM gene mutation     Mr. Hodgkin daughter, Helmut Muster, had genetic testing for the [facility] CancerNext-Expanded +RNA Panel in March 2024.  She was found to have a pathogenic variant in ATM called [id number].   There is no reported Ashkenazi Jewish ancestry. There is no known consanguinity.  GENETIC TEST RESULTS:  The [facility] CancerNext-Expanded +RNA Panel found no pathogenic mutations.   The CancerNext-Expanded gene panel offered by [facility] and includes sequencing, rearrangement, and RNA analysis for the following 71 genes:  AIP, ALK, APC, ATM, BAP1, BARD1, BMPR1A, BRCA1, BRCA2, BRIP1, CDC73, CDH1, CDK4, CDKN1B, CDKN2A, CHEK2, DICER1, FH, FLCN, KIF1B, LZTR1,MAX, MEN1, MET, MLH1, MSH2, MSH6, MUTYH, NF1, NF2, NTHL1, PALB2, PHOX2B, PMS2, POT1, PRKAR1A, PTCH1, PTEN, RAD51C,RAD51D, RB1, RET, SDHA, SDHAF2, SDHB, SDHC, SDHD, SMAD4, SMARCA4, SMARCB1, SMARCE1, STK11, SUFU, TMEM127, TP53,TSC1, TSC2 and VHL (sequencing and deletion/duplication); AXIN2, CTNNA1, EGFR, EGLN1, HOXB13, KIT, MITF, MSH3, PDGFRA, POLD1 and POLE (sequencing only); EPCAM and GREM1 (deletion/duplication only).    The test report has been scanned into EPIC and is  located under the Molecular Pathology section of the Results Review tab.  A portion of the result report is included below for reference. Genetic testing reported out on Dec 09, 2022.     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  At this time, it is unknown if this variant is associated with an increased risk for cancer or if it is benign, but most uncertain variants are reclassified to benign. It should not be used to make medical management decisions. With time, we suspect the laboratory will determine the significance of this variant, if any. If the laboratory reclassifies this variant, we will attempt to contact Mr. Dillahunt to discuss it further.   The ATM gene mutation that was previously detected in his daughter was not detected in his sample, suggesting that the ATM gene mutation that was detected in his daughter was maternally inherited.   Even though a pathogenic variant was not identified, possible explanations for the cancer in the family may include: There may be no hereditary risk for cancer in the family. The cancers in Mr. Kesten and/or his family may be sporadic/familial or due to other genetic and environmental factors.  This is most likely given the known family history.  There may be a gene mutation in one of these genes that current testing methods cannot detect, but that chance is small.  There could be another gene that has not yet been discovered, or that we have not yet tested, that is responsible for his  personal history of prostate cancer/family history of uterine cancer.   ADDITIONAL GENETIC TESTING:   Mr. Gazzola genetic testing was fairly extensive.  If there are additional relevant genes identified to increase cancer risk that can be analyzed in the future, we would be happy to discuss and coordinate this testing at that time.    CANCER SCREENING RECOMMENDATIONS:  Mr. Boonstra test result is considered  negative (normal).  This means that we have not identified a hereditary cause for his personal history of prostate at this time.   An individual's cancer risk and medical management are not determined by genetic test results alone. Overall cancer risk assessment incorporates additional factors, including personal medical history, family history, and any available genetic information that may result in a personalized plan for cancer prevention and surveillance. Therefore, it is recommended he continue to follow the cancer management and screening guidelines provided by his primary healthcare provider.  RECOMMENDATIONS FOR FAMILY MEMBERS:   Since he did not inherit a identifiable mutation in a cancer predisposition gene included on this panel, his children could not have inherited a known mutation from him in one of these genes. Individuals in this family might be at some increased risk of developing cancer, over the general population risk, due to the family history of cancer.  Individuals in the family should notify their providers of the family history of cancer. Male relatives should speak with their providers about prostate cancer screening.   We do not recommend familial testing for the SDHB variant of uncertain significance (VUS).  FOLLOW-UP:  Cancer genetics is a rapidly advancing field and it is possible that new genetic tests will be appropriate for him and/or his family members in the future.   Our contact number was provided.. His daughters know they are welcome to call us at anytime with additional questions or concerns.   Laurabelle Gorczyca M. Rennie Plowman, MS, [facility] Genetic Counselor [email or web address] (P) [phone]

## 2022-12-23 ENCOUNTER — Emergency Department (HOSPITAL_COMMUNITY): Payer: Medicare Other

## 2022-12-23 ENCOUNTER — Encounter: Payer: Self-pay | Admitting: Oncology

## 2022-12-23 ENCOUNTER — Other Ambulatory Visit: Payer: Self-pay

## 2022-12-23 ENCOUNTER — Inpatient Hospital Stay (HOSPITAL_COMMUNITY)
Admission: EM | Admit: 2022-12-23 | Discharge: 2022-12-25 | DRG: 073 | Disposition: A | Discharge status: Home / Self Care | Payer: Medicare Other | Attending: Internal Medicine | Admitting: Internal Medicine

## 2022-12-23 DIAGNOSIS — Z87891 Personal history of nicotine dependence: Secondary | ICD-10-CM

## 2022-12-23 DIAGNOSIS — T148XXA Other injury of unspecified body region, initial encounter: Secondary | ICD-10-CM

## 2022-12-23 DIAGNOSIS — Z8546 Personal history of malignant neoplasm of prostate: Secondary | ICD-10-CM

## 2022-12-23 DIAGNOSIS — Z953 Presence of xenogenic heart valve: Secondary | ICD-10-CM | POA: Diagnosis not present

## 2022-12-23 DIAGNOSIS — S01411A Laceration without foreign body of right cheek and temporomandibular area, initial encounter: Secondary | ICD-10-CM | POA: Diagnosis not present

## 2022-12-23 DIAGNOSIS — Z7901 Long term (current) use of anticoagulants: Secondary | ICD-10-CM

## 2022-12-23 DIAGNOSIS — I5033 Acute on chronic diastolic (congestive) heart failure: Secondary | ICD-10-CM | POA: Diagnosis present

## 2022-12-23 DIAGNOSIS — N400 Enlarged prostate without lower urinary tract symptoms: Secondary | ICD-10-CM | POA: Diagnosis present

## 2022-12-23 DIAGNOSIS — G909 Disorder of the autonomic nervous system, unspecified: Principal | ICD-10-CM | POA: Diagnosis present

## 2022-12-23 DIAGNOSIS — S0181XA Laceration without foreign body of other part of head, initial encounter: Secondary | ICD-10-CM | POA: Diagnosis present

## 2022-12-23 DIAGNOSIS — S2241XA Multiple fractures of ribs, right side, initial encounter for closed fracture: Secondary | ICD-10-CM | POA: Diagnosis not present

## 2022-12-23 DIAGNOSIS — I130 Hypertensive heart and chronic kidney disease with heart failure and stage 1 through stage 4 chronic kidney disease, or unspecified chronic kidney disease: Secondary | ICD-10-CM | POA: Diagnosis present

## 2022-12-23 DIAGNOSIS — I440 Atrioventricular block, first degree: Secondary | ICD-10-CM | POA: Diagnosis not present

## 2022-12-23 DIAGNOSIS — I10 Essential (primary) hypertension: Secondary | ICD-10-CM | POA: Diagnosis present

## 2022-12-23 DIAGNOSIS — E785 Hyperlipidemia, unspecified: Secondary | ICD-10-CM | POA: Diagnosis present

## 2022-12-23 DIAGNOSIS — I2510 Atherosclerotic heart disease of native coronary artery without angina pectoris: Secondary | ICD-10-CM | POA: Diagnosis present

## 2022-12-23 DIAGNOSIS — I2489 Other forms of acute ischemic heart disease: Secondary | ICD-10-CM | POA: Diagnosis not present

## 2022-12-23 DIAGNOSIS — I214 Non-ST elevation (NSTEMI) myocardial infarction: Secondary | ICD-10-CM | POA: Diagnosis not present

## 2022-12-23 DIAGNOSIS — N184 Chronic kidney disease, stage 4 (severe): Secondary | ICD-10-CM | POA: Diagnosis not present

## 2022-12-23 DIAGNOSIS — Z952 Presence of prosthetic heart valve: Secondary | ICD-10-CM | POA: Diagnosis not present

## 2022-12-23 DIAGNOSIS — R220 Localized swelling, mass and lump, head: Secondary | ICD-10-CM | POA: Diagnosis not present

## 2022-12-23 DIAGNOSIS — S3993XA Unspecified injury of pelvis, initial encounter: Secondary | ICD-10-CM | POA: Diagnosis not present

## 2022-12-23 DIAGNOSIS — Z85828 Personal history of other malignant neoplasm of skin: Secondary | ICD-10-CM

## 2022-12-23 DIAGNOSIS — W010XXA Fall on same level from slipping, tripping and stumbling without subsequent striking against object, initial encounter: Secondary | ICD-10-CM | POA: Diagnosis present

## 2022-12-23 DIAGNOSIS — I252 Old myocardial infarction: Secondary | ICD-10-CM

## 2022-12-23 DIAGNOSIS — Z79899 Other long term (current) drug therapy: Secondary | ICD-10-CM | POA: Diagnosis not present

## 2022-12-23 DIAGNOSIS — W19XXXA Unspecified fall, initial encounter: Secondary | ICD-10-CM

## 2022-12-23 DIAGNOSIS — R55 Syncope and collapse: Secondary | ICD-10-CM | POA: Diagnosis not present

## 2022-12-23 DIAGNOSIS — I480 Paroxysmal atrial fibrillation: Secondary | ICD-10-CM | POA: Diagnosis not present

## 2022-12-23 DIAGNOSIS — Y92019 Unspecified place in single-family (private) house as the place of occurrence of the external cause: Secondary | ICD-10-CM

## 2022-12-23 DIAGNOSIS — Z8049 Family history of malignant neoplasm of other genital organs: Secondary | ICD-10-CM

## 2022-12-23 DIAGNOSIS — T1490XA Injury, unspecified, initial encounter: Secondary | ICD-10-CM | POA: Diagnosis not present

## 2022-12-23 DIAGNOSIS — I6521 Occlusion and stenosis of right carotid artery: Secondary | ICD-10-CM

## 2022-12-23 DIAGNOSIS — R102 Pelvic and perineal pain: Secondary | ICD-10-CM | POA: Diagnosis not present

## 2022-12-23 DIAGNOSIS — Z96651 Presence of right artificial knee joint: Secondary | ICD-10-CM | POA: Diagnosis present

## 2022-12-23 DIAGNOSIS — I4892 Unspecified atrial flutter: Secondary | ICD-10-CM | POA: Diagnosis present

## 2022-12-23 DIAGNOSIS — S41111A Laceration without foreign body of right upper arm, initial encounter: Secondary | ICD-10-CM | POA: Diagnosis present

## 2022-12-23 DIAGNOSIS — I5A Non-ischemic myocardial injury (non-traumatic): Secondary | ICD-10-CM

## 2022-12-23 DIAGNOSIS — I69351 Hemiplegia and hemiparesis following cerebral infarction affecting right dominant side: Secondary | ICD-10-CM

## 2022-12-23 DIAGNOSIS — Z8043 Family history of malignant neoplasm of testis: Secondary | ICD-10-CM

## 2022-12-23 DIAGNOSIS — G459 Transient cerebral ischemic attack, unspecified: Secondary | ICD-10-CM | POA: Diagnosis not present

## 2022-12-23 DIAGNOSIS — Y9301 Activity, walking, marching and hiking: Secondary | ICD-10-CM | POA: Diagnosis present

## 2022-12-23 DIAGNOSIS — Z955 Presence of coronary angioplasty implant and graft: Secondary | ICD-10-CM

## 2022-12-23 DIAGNOSIS — R7989 Other specified abnormal findings of blood chemistry: Secondary | ICD-10-CM | POA: Diagnosis not present

## 2022-12-23 DIAGNOSIS — I69398 Other sequelae of cerebral infarction: Secondary | ICD-10-CM

## 2022-12-23 DIAGNOSIS — I5032 Chronic diastolic (congestive) heart failure: Secondary | ICD-10-CM | POA: Diagnosis present

## 2022-12-23 DIAGNOSIS — S0990XA Unspecified injury of head, initial encounter: Secondary | ICD-10-CM | POA: Diagnosis not present

## 2022-12-23 DIAGNOSIS — R079 Chest pain, unspecified: Secondary | ICD-10-CM | POA: Diagnosis not present

## 2022-12-23 LAB — CBC WITH DIFFERENTIAL/PLATELET
Abs Immature Granulocytes: 0.05 10*3/uL (ref 0.00–0.07)
Basophils Absolute: 0.1 10*3/uL (ref 0.0–0.1)
Basophils Relative: 1 %
Eosinophils Absolute: 0.3 10*3/uL (ref 0.0–0.5)
Eosinophils Relative: 2 %
HCT: 35.2 % — ABNORMAL LOW (ref 39.0–52.0)
Hemoglobin: 11.2 g/dL — ABNORMAL LOW (ref 13.0–17.0)
Immature Granulocytes: 0 %
Lymphocytes Relative: 11 %
Lymphs Abs: 1.3 10*3/uL (ref 0.7–4.0)
MCH: 33.3 pg (ref 26.0–34.0)
MCHC: 31.8 g/dL (ref 30.0–36.0)
MCV: 104.8 fL — ABNORMAL HIGH (ref 80.0–100.0)
Monocytes Absolute: 1.1 10*3/uL — ABNORMAL HIGH (ref 0.1–1.0)
Monocytes Relative: 9 %
Neutro Abs: 9.4 10*3/uL — ABNORMAL HIGH (ref 1.7–7.7)
Neutrophils Relative %: 77 %
Platelets: 136 10*3/uL — ABNORMAL LOW (ref 150–400)
RBC: 3.36 MIL/uL — ABNORMAL LOW (ref 4.22–5.81)
RDW: 13.2 % (ref 11.5–15.5)
WBC: 12.3 10*3/uL — ABNORMAL HIGH (ref 4.0–10.5)
nRBC: 0 % (ref 0.0–0.2)

## 2022-12-23 LAB — PROTIME-INR
INR: 1.2 (ref 0.8–1.2)
Prothrombin Time: 15.1 seconds (ref 11.4–15.2)

## 2022-12-23 LAB — COMPREHENSIVE METABOLIC PANEL
ALT: 13 U/L (ref 0–44)
AST: 22 U/L (ref 15–41)
Albumin: 3.3 g/dL — ABNORMAL LOW (ref 3.5–5.0)
Alkaline Phosphatase: 76 U/L (ref 38–126)
Anion gap: 11 (ref 5–15)
BUN: 52 mg/dL — ABNORMAL HIGH (ref 8–23)
CO2: 20 mmol/L — ABNORMAL LOW (ref 22–32)
Calcium: 8.5 mg/dL — ABNORMAL LOW (ref 8.9–10.3)
Chloride: 108 mmol/L (ref 98–111)
Creatinine, Ser: 2.71 mg/dL — ABNORMAL HIGH (ref 0.61–1.24)
GFR, Estimated: 22 mL/min — ABNORMAL LOW (ref >60)
Glucose, Bld: 120 mg/dL — ABNORMAL HIGH (ref 70–99)
Potassium: 4.6 mmol/L (ref 3.5–5.1)
Sodium: 139 mmol/L (ref 135–145)
Total Bilirubin: 0.7 mg/dL (ref 0.3–1.2)
Total Protein: 6.6 g/dL (ref 6.5–8.1)

## 2022-12-23 LAB — URINALYSIS, ROUTINE W REFLEX MICROSCOPIC
Bilirubin Urine: NEGATIVE
Glucose, UA: NEGATIVE mg/dL
Hgb urine dipstick: NEGATIVE
Ketones, ur: NEGATIVE mg/dL
Leukocytes,Ua: NEGATIVE
Nitrite: NEGATIVE
Protein, ur: NEGATIVE mg/dL
Specific Gravity, Urine: 1.009 (ref 1.005–1.030)
pH: 5 (ref 5.0–8.0)

## 2022-12-23 LAB — I-STAT CHEM 8, ED
BUN: 45 mg/dL — ABNORMAL HIGH (ref 8–23)
Calcium, Ion: 1.07 mmol/L — ABNORMAL LOW (ref 1.15–1.40)
Chloride: 110 mmol/L (ref 98–111)
Creatinine, Ser: 3 mg/dL — ABNORMAL HIGH (ref 0.61–1.24)
Glucose, Bld: 113 mg/dL — ABNORMAL HIGH (ref 70–99)
HCT: 35 % — ABNORMAL LOW (ref 39.0–52.0)
Hemoglobin: 11.9 g/dL — ABNORMAL LOW (ref 13.0–17.0)
Potassium: 4.7 mmol/L (ref 3.5–5.1)
Sodium: 140 mmol/L (ref 135–145)
TCO2: 20 mmol/L — ABNORMAL LOW (ref 22–32)

## 2022-12-23 LAB — CBG MONITORING, ED: Glucose-Capillary: 95 mg/dL (ref 70–99)

## 2022-12-23 LAB — TROPONIN I (HIGH SENSITIVITY)
Troponin I (High Sensitivity): 214 ng/L (ref <18)
Troponin I (High Sensitivity): 175 ng/L (ref <18)

## 2022-12-23 LAB — APTT: aPTT: 33 seconds (ref 24–36)

## 2022-12-23 LAB — CK: Total CK: 50 U/L (ref 49–397)

## 2022-12-23 MED ORDER — ASPIRIN 325 MG PO TABS
325.0000 mg | ORAL_TABLET | Freq: Every day | ORAL | Status: DC
  Administered 2022-12-23: 325 mg via ORAL
  Filled 2022-12-23: qty 1

## 2022-12-23 MED ORDER — DILTIAZEM HCL ER COATED BEADS 120 MG PO CP24
120.0000 mg | ORAL_CAPSULE | Freq: Every day | ORAL | Status: DC
  Administered 2022-12-24 – 2022-12-25 (×2): 120 mg via ORAL
  Filled 2022-12-23 (×2): qty 1

## 2022-12-23 MED ORDER — LIDOCAINE-EPINEPHRINE (PF) 2 %-1:200000 IJ SOLN
10.0000 mL | Freq: Once | INTRAMUSCULAR | Status: AC
  Administered 2022-12-23: 10 mL via INTRADERMAL
  Filled 2022-12-23: qty 20

## 2022-12-23 MED ORDER — APIXABAN 2.5 MG PO TABS
2.5000 mg | ORAL_TABLET | Freq: Two times a day (BID) | ORAL | Status: DC
  Administered 2022-12-23 – 2022-12-25 (×4): 2.5 mg via ORAL
  Filled 2022-12-23 (×4): qty 1

## 2022-12-23 MED ORDER — ONDANSETRON HCL 4 MG/2ML IJ SOLN: 4.0000 mg | Freq: Four times a day (QID) | INTRAMUSCULAR | Status: DC | PRN

## 2022-12-23 MED ORDER — SODIUM CHLORIDE 0.9% FLUSH
3.0000 mL | Freq: Two times a day (BID) | INTRAVENOUS | Status: DC
  Administered 2022-12-23 – 2022-12-25 (×4): 3 mL via INTRAVENOUS

## 2022-12-23 MED ORDER — BACITRACIN ZINC 500 UNIT/GM EX OINT
TOPICAL_OINTMENT | Freq: Two times a day (BID) | CUTANEOUS | Status: DC
  Administered 2022-12-23 – 2022-12-25 (×4): 31.5 via TOPICAL
  Filled 2022-12-23: qty 28.4

## 2022-12-23 MED ORDER — TRAMADOL HCL 50 MG PO TABS: 50.0000 mg | ORAL_TABLET | Freq: Three times a day (TID) | ORAL | Status: DC | PRN

## 2022-12-23 MED ORDER — TOPIRAMATE 25 MG PO TABS
100.0000 mg | ORAL_TABLET | Freq: Two times a day (BID) | ORAL | Status: DC
  Administered 2022-12-23 – 2022-12-25 (×4): 100 mg via ORAL
  Filled 2022-12-23 (×4): qty 4

## 2022-12-23 MED ORDER — EZETIMIBE 10 MG PO TABS
10.0000 mg | ORAL_TABLET | Freq: Every day | ORAL | Status: DC
  Administered 2022-12-23 – 2022-12-25 (×3): 10 mg via ORAL
  Filled 2022-12-23 (×3): qty 1

## 2022-12-23 MED ORDER — ROSUVASTATIN CALCIUM 5 MG PO TABS
10.0000 mg | ORAL_TABLET | Freq: Every day | ORAL | Status: DC
  Administered 2022-12-23 – 2022-12-25 (×3): 10 mg via ORAL
  Filled 2022-12-23 (×3): qty 2

## 2022-12-23 MED ORDER — DILTIAZEM HCL ER COATED BEADS 240 MG PO CP24
240.0000 mg | ORAL_CAPSULE | Freq: Every day | ORAL | Status: DC
  Administered 2022-12-23: 240 mg via ORAL
  Filled 2022-12-23: qty 1

## 2022-12-23 MED ORDER — FUROSEMIDE 10 MG/ML IJ SOLN
40.0000 mg | Freq: Every day | INTRAMUSCULAR | Status: DC
  Administered 2022-12-23 – 2022-12-24 (×2): 40 mg via INTRAVENOUS
  Filled 2022-12-23 (×3): qty 4

## 2022-12-23 MED ORDER — LIDOCAINE 5 % EX PTCH
1.0000 | MEDICATED_PATCH | CUTANEOUS | Status: DC
  Administered 2022-12-23 – 2022-12-24 (×2): 1 via TRANSDERMAL
  Filled 2022-12-23 (×2): qty 1

## 2022-12-23 MED ORDER — FLUOXETINE HCL 10 MG PO CAPS
10.0000 mg | ORAL_CAPSULE | Freq: Every day | ORAL | Status: DC
  Administered 2022-12-24 – 2022-12-25 (×2): 10 mg via ORAL
  Filled 2022-12-23 (×2): qty 1

## 2022-12-23 MED ORDER — ISOSORBIDE MONONITRATE ER 60 MG PO TB24
60.0000 mg | ORAL_TABLET | Freq: Every day | ORAL | Status: DC
  Administered 2022-12-24 – 2022-12-25 (×2): 60 mg via ORAL
  Filled 2022-12-23 (×2): qty 1

## 2022-12-23 MED ORDER — NITROGLYCERIN 0.4 MG SL SUBL
0.4000 mg | SUBLINGUAL_TABLET | SUBLINGUAL | Status: DC | PRN
  Administered 2022-12-23: 0.4 mg via SUBLINGUAL
  Filled 2022-12-23 (×2): qty 1

## 2022-12-23 MED ORDER — HYDRALAZINE HCL 20 MG/ML IJ SOLN: 5.0000 mg | Freq: Four times a day (QID) | INTRAMUSCULAR | Status: DC | PRN

## 2022-12-23 MED ORDER — SODIUM CHLORIDE 0.9% FLUSH: 3.0000 mL | INTRAVENOUS | Status: DC | PRN

## 2022-12-23 MED ORDER — SODIUM CHLORIDE 0.9 % IV SOLN: 250.0000 mL | INTRAVENOUS | Status: DC | PRN

## 2022-12-23 MED ORDER — ACETAMINOPHEN 325 MG PO TABS
650.0000 mg | ORAL_TABLET | ORAL | Status: DC | PRN
  Administered 2022-12-24 (×2): 650 mg via ORAL
  Filled 2022-12-23: qty 2

## 2022-12-23 MED ORDER — ACETAMINOPHEN 325 MG PO TABS
650.0000 mg | ORAL_TABLET | ORAL | Status: DC | PRN
  Filled 2022-12-23: qty 2

## 2022-12-23 MED ORDER — HYDRALAZINE HCL 10 MG PO TABS
10.0000 mg | ORAL_TABLET | Freq: Three times a day (TID) | ORAL | Status: DC
  Administered 2022-12-23 – 2022-12-25 (×7): 10 mg via ORAL
  Filled 2022-12-23 (×7): qty 1

## 2022-12-23 NOTE — Progress Notes (Signed)
Orthopedic Tech Progress Note Patient Details:  Mark Jackson 1929/11/24 [id number] Level 2 Trauma  Patient ID: Mark Pao., male   DOB: [birth year], 89 y.o.   MRN: [id number]  Mark Jackson 12/23/2022, 12:19 PM

## 2022-12-23 NOTE — Consult Note (Signed)
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 bones. 3. No acute osseous injury of the cervical spine. 4. Cervical spine spondylosis as described above. Electronically Signed   By: Elige Ko M.D.   On: 12/23/2022 13:31   DG Chest Portable 1 View  Result Date: 12/23/2022 CLINICAL DATA:  Chest pain EXAM: PORTABLE CHEST 1 VIEW COMPARISON:  06/14/2022 FINDINGS: No focal consolidation. No pleural effusion or pneumothorax. Heart and mediastinal contours are unremarkable. Prior TAVR. No acute osseous abnormality. IMPRESSION: No active disease. Electronically Signed   By: Alan Ripper  Patel M.D.   On: 12/23/2022 12:56   DG Pelvis Portable  Result Date: 12/23/2022 CLINICAL DATA:  Chest pain, trauma, fall. EXAM: PORTABLE PELVIS 1-2 VIEWS COMPARISON:  04/28/2009. FINDINGS: Hip joint space is uniform bilaterally. No acute osseous abnormality. Brachytherapy seeds in the prostate. IMPRESSION: No acute findings. Electronically Signed   By: Leanna Battles M.D.   On: 12/23/2022 12:55     Assessment and Plan:    Patient is a 89 year old M known to have CAD s/p LAD PCI in 2016 and 2017, severe aortic valve stenosis s/p TAVR  in 2021, history of CVA in 2023, paroxysmal A-fib on AC, CKD stage IV, chronic diastolic heart failure presented to ER with syncope.  # Syncope # First-degree AV block, PR interval 400 ms -Patient was walking with his cane for a few steps and fell down on his right side. Does not remember any symptoms of dizziness/lightheadedness or palpitations prior to the fall. No seizure activity. No postictal confusion. After he regained consciousness, he complained of pain on the right side of his chest which probably is secondary to fall. EKG showed prolonged first-degree AV block with PR interval 400 ms (reviewed the prior EKGs that showed PR interval around 170-200 ish msec in 2023). He denies any dizziness/lightheadedness but he is not much active to reproduce his symptoms. Will decrease the dose of diltiazem from 240 mg to 120 mg once daily. He was previously on 120 mg which was increased to 240 mg in 01/2022. Recommend 2-week live event monitor upon discharge.  # Acute on chronic diastolic heart failure exacerbation -JVD is elevated and has 2+ pitting edema in bilateral lower extremities. Patient is not much active to have symptoms of DOE. Okay for spot dosing of IV Lasix.  Recommend to increase his p.o.dose of Lasix upon discharge. -Obtain 2D Echocardiogram  # Troponin elevation likely demand ischemia: No angina in the last one year. Chest pain he is complaining now is due to the fall.  # Paroxysmal A-fib # History of CVA in 2023 -Decrease the dose of diltiazem from 240 mg to 120 mg once daily (due to prolonged PR interval 400 msec and fall) -Okay to continue Eliquis 2.5 mg twice daily (benefit outweighs risk)  # CAD s/p LAD PCI in 2016 and 2017 -Not on aspirin due to Eliquis use (will discontinue aspirin 325 mg) -Continue rosuvastatin 10 mg nightly  # Severe aortic valve stenosis s/p TAVR in 2021 -Outpatient surveillance   I have spent a total of 70 minutes with patient reviewing chart ,  telemetry, EKGs, labs and examining patient as well as establishing an assessment and plan that was discussed with the patient.  > 50% of time was spent in direct patient care.     Chen Holzman Verne Spurr, MD [place]  [facility]  7:42 PM

## 2022-12-23 NOTE — ED Triage Notes (Signed)
Pt to ED via EMS from home. Pt's caregiver reports pt falling face first on face and head this morning around 10am. Pt does not remember fall. Suspected LOC. After caregiver helped pt up, pt developed right sided chest pain. Pt is on eliquis. No obvious fx of deformities. Pt has lac below right eye and right forearm. Pt is AAOx4.   EMS Vitals: 164/94 18 RR 98% RA 72 HR

## 2022-12-23 NOTE — ED Notes (Signed)
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 range)  topiramate (TOPAMAX) tablet 100 mg (has no administration in time range)  sodium chloride flush (NS) 0.9 % injection 3 mL (has no administration in time range)  sodium chloride flush (NS) 0.9 % injection 3 mL (has no administration in time range)  0.9 %  sodium chloride infusion (has no administration in time range)  acetaminophen (TYLENOL) tablet 650 mg (has no administration in time range)  ondansetron (ZOFRAN) injection 4 mg (has no administration in time range)  lidocaine-EPINEPHrine (XYLOCAINE W/EPI) 2 %-1:200000 (PF) injection 10 mL (10 mLs Intradermal Given by Other 12/23/22 1451)    Mobility walks with person assist     Focused Assessments Neuro Assessment Handoff:  Swallow screen pass? Yes          Neuro Assessment:   Neuro Checks:      Has TPA been given? No If patient is a Neuro Trauma and patient is going to OR before floor call report to 4N Charge nurse: [phone] or  [phone]   R Recommendations: See Admitting Provider Note  Report given to:   Additional Notes: pt is AAOx4. Pt is on room air. Pt is using urinal.

## 2022-12-23 NOTE — H&P (Signed)
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 hypertension   Chronic diastolic heart failure (HCC)   Gait disturbance, post-stroke  (please populate well all problems here in Problem List. (For example, if patient is on BP meds at home and you resume or decide to hold them, it is a problem that needs to be her. Same for CAD, COPD, HLD and so on)  Syncope -Suspected, no significant prodromes of vasovagal reaction, check orthostatic vital signs, check echocardiogram.  PT evaluation tomorrow. -Other DDx, frequent PVCs on EKG, keep telemonitoring for 24 hours to rule out major arrhythmia.  Fall -Suspected syncope, management as above.  PT evaluation tomorrow -Scan negative.  Elevated troponins -Doubt ACS, will continue to trend troponins -Echo -Cardiology consulted in the ED -Hold off drip, continue Eliquis tomorrow -Other Ddx, might related to CKD, as well as the fall event, check CK level  Acute on chronic HFpEF decompensation -Probably secondary to uncontrolled hypertension -Home p.o. Lasix regimen to IV Lasix 40 mg daily, while monitoring kidney function -Echocardiogram -Add hydralazine 10 mg 3  times daily  hTN, uncontrolled -Continue Cardizem, continue Imdur (starting tomorrow) -Hydralazine 3 times daily  CKD stage IIIb -Signs of volume overloading, and management as above  Code status -Family members all wish patient remain on Full Code  DVT prophylaxis:  Eliquis Code Status: Full code Family Communication:Daughter at bedside Disposition Plan: Expect less than 2 midnight hospital stay Consults called: Cardiology Admission status: Tele obs   Emeline General MD [facility] Pager [phone]  12/23/2022, 4:16 PM

## 2022-12-23 NOTE — ED Notes (Signed)
RN transporting pt to CT on portable monitor.
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
## 2022-12-24 ENCOUNTER — Observation Stay (HOSPITAL_COMMUNITY): Payer: Medicare Other

## 2022-12-24 ENCOUNTER — Encounter (HOSPITAL_COMMUNITY): Payer: Self-pay | Admitting: Internal Medicine

## 2022-12-24 ENCOUNTER — Ambulatory Visit: Payer: Medicare Other | Admitting: Family Medicine

## 2022-12-24 DIAGNOSIS — G459 Transient cerebral ischemic attack, unspecified: Secondary | ICD-10-CM | POA: Diagnosis not present

## 2022-12-24 DIAGNOSIS — I480 Paroxysmal atrial fibrillation: Secondary | ICD-10-CM | POA: Diagnosis present

## 2022-12-24 DIAGNOSIS — Z85828 Personal history of other malignant neoplasm of skin: Secondary | ICD-10-CM | POA: Diagnosis not present

## 2022-12-24 DIAGNOSIS — Z79899 Other long term (current) drug therapy: Secondary | ICD-10-CM | POA: Diagnosis not present

## 2022-12-24 DIAGNOSIS — I2489 Other forms of acute ischemic heart disease: Secondary | ICD-10-CM | POA: Diagnosis present

## 2022-12-24 DIAGNOSIS — Z87891 Personal history of nicotine dependence: Secondary | ICD-10-CM | POA: Diagnosis not present

## 2022-12-24 DIAGNOSIS — Z7901 Long term (current) use of anticoagulants: Secondary | ICD-10-CM | POA: Diagnosis not present

## 2022-12-24 DIAGNOSIS — N400 Enlarged prostate without lower urinary tract symptoms: Secondary | ICD-10-CM | POA: Diagnosis present

## 2022-12-24 DIAGNOSIS — E785 Hyperlipidemia, unspecified: Secondary | ICD-10-CM | POA: Diagnosis present

## 2022-12-24 DIAGNOSIS — S01411A Laceration without foreign body of right cheek and temporomandibular area, initial encounter: Secondary | ICD-10-CM | POA: Diagnosis present

## 2022-12-24 DIAGNOSIS — S0181XA Laceration without foreign body of other part of head, initial encounter: Secondary | ICD-10-CM | POA: Diagnosis present

## 2022-12-24 DIAGNOSIS — Z96651 Presence of right artificial knee joint: Secondary | ICD-10-CM | POA: Diagnosis present

## 2022-12-24 DIAGNOSIS — R7989 Other specified abnormal findings of blood chemistry: Secondary | ICD-10-CM | POA: Diagnosis not present

## 2022-12-24 DIAGNOSIS — Z8546 Personal history of malignant neoplasm of prostate: Secondary | ICD-10-CM | POA: Diagnosis not present

## 2022-12-24 DIAGNOSIS — R55 Syncope and collapse: Secondary | ICD-10-CM

## 2022-12-24 DIAGNOSIS — I69351 Hemiplegia and hemiparesis following cerebral infarction affecting right dominant side: Secondary | ICD-10-CM | POA: Diagnosis not present

## 2022-12-24 DIAGNOSIS — I252 Old myocardial infarction: Secondary | ICD-10-CM | POA: Diagnosis not present

## 2022-12-24 DIAGNOSIS — Y92019 Unspecified place in single-family (private) house as the place of occurrence of the external cause: Secondary | ICD-10-CM | POA: Diagnosis not present

## 2022-12-24 DIAGNOSIS — Y9301 Activity, walking, marching and hiking: Secondary | ICD-10-CM | POA: Diagnosis present

## 2022-12-24 DIAGNOSIS — I2510 Atherosclerotic heart disease of native coronary artery without angina pectoris: Secondary | ICD-10-CM | POA: Diagnosis present

## 2022-12-24 DIAGNOSIS — Z955 Presence of coronary angioplasty implant and graft: Secondary | ICD-10-CM | POA: Diagnosis not present

## 2022-12-24 DIAGNOSIS — N184 Chronic kidney disease, stage 4 (severe): Secondary | ICD-10-CM | POA: Diagnosis present

## 2022-12-24 DIAGNOSIS — Z953 Presence of xenogenic heart valve: Secondary | ICD-10-CM | POA: Diagnosis not present

## 2022-12-24 DIAGNOSIS — I6521 Occlusion and stenosis of right carotid artery: Secondary | ICD-10-CM | POA: Diagnosis present

## 2022-12-24 DIAGNOSIS — I130 Hypertensive heart and chronic kidney disease with heart failure and stage 1 through stage 4 chronic kidney disease, or unspecified chronic kidney disease: Secondary | ICD-10-CM | POA: Diagnosis present

## 2022-12-24 DIAGNOSIS — I4892 Unspecified atrial flutter: Secondary | ICD-10-CM | POA: Diagnosis present

## 2022-12-24 DIAGNOSIS — W010XXA Fall on same level from slipping, tripping and stumbling without subsequent striking against object, initial encounter: Secondary | ICD-10-CM | POA: Diagnosis present

## 2022-12-24 DIAGNOSIS — G909 Disorder of the autonomic nervous system, unspecified: Secondary | ICD-10-CM | POA: Diagnosis present

## 2022-12-24 DIAGNOSIS — I5033 Acute on chronic diastolic (congestive) heart failure: Secondary | ICD-10-CM | POA: Diagnosis present

## 2022-12-24 DIAGNOSIS — S41111A Laceration without foreign body of right upper arm, initial encounter: Secondary | ICD-10-CM | POA: Diagnosis present

## 2022-12-24 HISTORY — PX: TRANSTHORACIC ECHOCARDIOGRAM: SHX275

## 2022-12-24 LAB — BASIC METABOLIC PANEL
Anion gap: 10 (ref 5–15)
BUN: 49 mg/dL — ABNORMAL HIGH (ref 8–23)
CO2: 20 mmol/L — ABNORMAL LOW (ref 22–32)
Calcium: 8.3 mg/dL — ABNORMAL LOW (ref 8.9–10.3)
Chloride: 107 mmol/L (ref 98–111)
Creatinine, Ser: 2.59 mg/dL — ABNORMAL HIGH (ref 0.61–1.24)
GFR, Estimated: 23 mL/min — ABNORMAL LOW (ref >60)
Glucose, Bld: 118 mg/dL — ABNORMAL HIGH (ref 70–99)
Potassium: 4.3 mmol/L (ref 3.5–5.1)
Sodium: 137 mmol/L (ref 135–145)

## 2022-12-24 LAB — ECHOCARDIOGRAM COMPLETE
AR max vel: 1.79 cm2
AV Area VTI: 1.85 cm2
AV Area mean vel: 1.84 cm2
AV Mean grad: 14 mmHg
AV Peak grad: 26.5 mmHg
Ao pk vel: 2.57 m/s
Area-P 1/2: 5.02 cm2
Height: 68 in
S' Lateral: 2.8 cm
Weight: 3000 oz

## 2022-12-24 LAB — TROPONIN I (HIGH SENSITIVITY): Troponin I (High Sensitivity): 147 ng/L (ref <18)

## 2022-12-24 MED ORDER — SENNOSIDES-DOCUSATE SODIUM 8.6-50 MG PO TABS: 1.0000 | ORAL_TABLET | Freq: Every evening | ORAL | Status: DC | PRN

## 2022-12-24 MED ORDER — HYDRALAZINE HCL 20 MG/ML IJ SOLN: 10.0000 mg | INTRAMUSCULAR | Status: DC | PRN

## 2022-12-24 MED ORDER — IPRATROPIUM-ALBUTEROL 0.5-2.5 (3) MG/3ML IN SOLN: 3.0000 mL | RESPIRATORY_TRACT | Status: DC | PRN

## 2022-12-24 MED ORDER — GUAIFENESIN 100 MG/5ML PO LIQD: 5.0000 mL | ORAL | Status: DC | PRN

## 2022-12-24 MED ORDER — METOPROLOL TARTRATE 5 MG/5ML IV SOLN: 5.0000 mg | INTRAVENOUS | Status: DC | PRN

## 2022-12-24 MED ORDER — OXYCODONE HCL 5 MG PO TABS: 5.0000 mg | ORAL_TABLET | ORAL | Status: DC | PRN

## 2022-12-24 NOTE — Evaluation (Signed)
Physical Therapy Evaluation Patient Details Name: Mark Jackson. MRN: [id number] DOB: 02-Mar-1930 Today's Date: 12/24/2022  History of Present Illness  Pt is a 89 year old male admitted on 12/23/22 for a fall at home. Past medical history significant of chronic ambulation dysfunction on roller walker with frequent falls, CAD status post stenting in 2020, HTN, CKD stage IIIB, PAF on Eliquis, AS s/p TAVR, migraines on Topamax.  Clinical Impression  Pt presents with admitting diagnosis above. Pt reports that he currently lives alone however has family close by and a PCA that comes by 5x/week for 5 hours a day. Pt also reports that he has had over 12 falls within the last 6 months. Today, pt ambulated fairly well requiring Min G with RW in hallway and no LOB noted. Pt reports that he feels he is at his baseline for mobility. Pt educated that pt would benefit from RW at current mobility level vs. Rollator for stability at home due to repeated falls. Recommend HHPT upon DC due to repeated falls at home. PT will be signing off. Re consult PT if mobility status changes.      Recommendations for follow up therapy are one component of a multi-disciplinary discharge planning process, led by the attending physician.  Recommendations may be updated based on patient status, additional functional criteria and insurance authorization.  Follow Up Recommendations       Assistance Recommended at Discharge Intermittent Supervision/Assistance  Patient can return home with the following  A little help with walking and/or transfers;A little help with bathing/dressing/bathroom;Assistance with cooking/housework;Direct supervision/assist for medications management;Assist for transportation;Help with stairs or ramp for entrance    Equipment Recommendations None recommended by PT (Pt has needed DME)  Recommendations for Other Services       Functional Status Assessment Patient has had a recent decline in their  functional status and demonstrates the ability to make significant improvements in function in a reasonable and predictable amount of time.     Precautions / Restrictions Precautions Precautions: Fall Restrictions Weight Bearing Restrictions: No      Mobility  Bed Mobility               General bed mobility comments: Pt received sitting EOB    Transfers Overall transfer level: Needs assistance Equipment used: Rolling walker (2 wheels) Transfers: Sit to/from Stand Sit to Stand: Supervision           General transfer comment: Pt required 3 rocking attempts before being able to stand. Pt reports this is baseline for him.    Ambulation/Gait Ambulation/Gait assistance: Min guard Gait Distance (Feet): 300 Feet Assistive device: Rolling walker (2 wheels) Gait Pattern/deviations: Decreased stride length, Step-through pattern, Trunk flexed Gait velocity: decreased     General Gait Details: Slow steady gait. No LOB noted. Pt with occasional shuffle.  Stairs            Wheelchair Mobility    Modified Rankin (Stroke Patients Only)       Balance Overall balance assessment: Mild deficits observed, not formally tested                                           Pertinent Vitals/Pain Pain Assessment Pain Assessment: 0-10 Pain Score: 3  Pain Location: R sided chest pain Pain Descriptors / Indicators: Aching, Discomfort, Grimacing Pain Intervention(s): Monitored during session    Home Living Family/patient  expects to be discharged to:: Private residence Living Arrangements: Alone Available Help at Discharge: Personal care attendant;Available PRN/intermittently;Family (5 days a week for 5 hours a day. 4 children that live within 3 miles.) Type of Home: House Home Access: Ramped entrance     Alternate Level Stairs-Number of Steps: flight Home Layout: Two level;Able to live on main level with bedroom/bathroom Home Equipment: Rollator (4  wheels);Cane - quad;Cane - single point;BSC/3in1;Shower seat;Grab bars - toilet;Grab bars - tub/shower;Hand held shower head;Wheelchair - manual      Prior Function Prior Level of Function : Independent/Modified Independent;History of Falls (last six months) (Pt reports at least 12 falls in the last 6 months. Pt states that he typically falls backwards and has difficulty getting up)             Mobility Comments: Mod I rollator around the house ADLs Comments: PCA helping with ADLs since stroke last year     Hand Dominance   Dominant Hand: Right    Extremity/Trunk Assessment   Upper Extremity Assessment Upper Extremity Assessment: Overall WFL for tasks assessed    Lower Extremity Assessment Lower Extremity Assessment: Overall WFL for tasks assessed (Slight residual strength deficits on RLE from previous CVA)    Cervical / Trunk Assessment Cervical / Trunk Assessment: Normal  Communication   Communication: No difficulties  Cognition Arousal/Alertness: Awake/alert Behavior During Therapy: WFL for tasks assessed/performed Overall Cognitive Status: Within Functional Limits for tasks assessed                                          General Comments General comments (skin integrity, edema, etc.): VSS on RA    Exercises     Assessment/Plan    PT Assessment All further PT needs can be met in the next venue of care  PT Problem List Decreased strength;Decreased range of motion;Decreased activity tolerance;Decreased balance;Decreased mobility;Decreased coordination;Decreased knowledge of use of DME;Decreased safety awareness;Cardiopulmonary status limiting activity       PT Treatment Interventions      PT Goals (Current goals can be found in the Care Plan section)       Frequency       Co-evaluation               AM-PAC PT "6 Clicks" Mobility  Outcome Measure Help needed turning from your back to your side while in a flat bed without using  bedrails?: None Help needed moving from lying on your back to sitting on the side of a flat bed without using bedrails?: None Help needed moving to and from a bed to a chair (including a wheelchair)?: A Little Help needed standing up from a chair using your arms (e.g., wheelchair or bedside chair)?: A Little Help needed to walk in hospital room?: A Little Help needed climbing 3-5 steps with a railing? : A Lot 6 Click Score: 19    End of Session Equipment Utilized During Treatment: Gait belt Activity Tolerance: Patient tolerated treatment well Patient left: in bed;with call bell/phone within reach (seated EOB) Nurse Communication: Mobility status PT Visit Diagnosis: Other abnormalities of gait and mobility (R26.89);Repeated falls (R29.6);Unsteadiness on feet (R26.81)    Time: 1191-4782 PT Time Calculation (min) (ACUTE ONLY): 36 min   Charges:   PT Evaluation $PT Eval Moderate Complexity: 1 Mod PT Treatments $Gait Training: 8-22 mins        Carmin Dibartolo B, PT,  DPT Acute Rehab Services [phone]   Gladys Damme 12/24/2022, 9:56 AM

## 2022-12-24 NOTE — Care Management Obs Status (Signed)
MEDICARE OBSERVATION STATUS NOTIFICATION   Patient Details  Name: Mark Jackson. MRN: [id number] Date of Birth: 1930/05/20   Medicare Observation Status Notification Given:  Yes Verbal permission over phone due to remote from [name] - daughter   [name], RN 12/24/2022, 11:25 AM

## 2022-12-24 NOTE — Evaluation (Signed)
Occupational Therapy Evaluation Patient Details Name: Mark Jackson. MRN: [id number] DOB: April 06, 1930 Today's Date: 12/24/2022   History of Present Illness Pt is a 89 year old male admitted on 12/23/22 for a fall at home. Past medical history significant of chronic ambulation dysfunction on roller walker with frequent falls, CAD status post stenting in 2020, HTN, CKD stage IIIB, PAF on Eliquis, AS s/p TAVR, migraines on Topamax.   Clinical Impression   Pt admitted for above dx, PTA pt lives alone but has a PCA and family members within 3 miles from him, ambulated Mod I with rollator and reports being independent in dress, his son provides supervision for bathing and his PCA assists with iADLs. Pt currently presenting close to baseline, ambulating and completing bADLs with min guard assist to close supervision. Pt does report 12 falls in the last 6 months, he reports he just falls backwards but is unsure how, Pt also reports some visual deficits (see below) although he passed a brief visual assessment it will need to be assessed in further context. OT will continue to follow pt acutely to address above deficits and help transition to next level of care. Patient would benefit from post acute Home OT services to help maximize functional independence in natural environment       Recommendations for follow up therapy are one component of a multi-disciplinary discharge planning process, led by the attending physician.  Recommendations may be updated based on patient status, additional functional criteria and insurance authorization.   Assistance Recommended at Discharge Intermittent Supervision/Assistance  Patient can return home with the following A little help with walking and/or transfers;Assistance with cooking/housework;Assist for transportation;Help with stairs or ramp for entrance    Functional Status Assessment  Patient has had a recent decline in their functional status and demonstrates the  ability to make significant improvements in function in a reasonable and predictable amount of time.  Equipment Recommendations  None recommended by OT (pt has rec DME)    Recommendations for Other Services       Precautions / Restrictions Precautions Precautions: Fall Precaution Comments: hx of falls (12x in last 6 months) Restrictions Weight Bearing Restrictions: No      Mobility Bed Mobility Overal bed mobility: Needs Assistance Bed Mobility: Sit to Supine, Supine to Sit     Supine to sit: Supervision Sit to supine: Supervision        Transfers Overall transfer level: Needs assistance Equipment used: Rolling walker (2 wheels) Transfers: Sit to/from Stand Sit to Stand: Supervision           General transfer comment: pt a bit slow to rise. STS x3, Min A+ grab bar for STS from toilet      Balance Overall balance assessment: Needs assistance Sitting-balance support: Feet supported Sitting balance-Leahy Scale: Good       Standing balance-Leahy Scale: Fair                             ADL either performed or assessed with clinical judgement   ADL Overall ADL's : At baseline;Needs assistance/impaired Eating/Feeding: Independent   Grooming: Standing;Min guard;Wash/dry hands   Upper Body Bathing: Sitting;Set up;Supervision/ safety Upper Body Bathing Details (indicate cue type and reason): has supervision at baseline Lower Body Bathing: Supervison/ safety;Sitting/lateral leans;Set up Lower Body Bathing Details (indicate cue type and reason): has supervision at baseline Upper Body Dressing : Sitting;Set up;Supervision/safety Upper Body Dressing Details (indicate cue type and reason):  donned gown Lower Body Dressing: Sitting/lateral leans;Min guard   Toilet Transfer: Min guard;Ambulation;Rolling walker (2 wheels)   Toileting- Clothing Manipulation and Hygiene: Sitting/lateral lean;Supervision/safety   Tub/ Engineer, structural: Min guard;Ambulation    Functional mobility during ADLs: Min guard;Rolling walker (2 wheels)       Vision Baseline Vision/History: 1 Wears glasses Depth Perception: Undershoots (BUEs) Additional Comments: Pt reports sometimes he sees things that aren't there and sometimes he sees things that are there. Ex: Pt sometimes sees his comb on the counter and sometimes it is not there. Contrast sensitivity intact     Perception     Praxis      Pertinent Vitals/Pain Pain Assessment Pain Assessment: 0-10 Pain Score: 3  Pain Location: R sided chest pain Pain Descriptors / Indicators: Aching, Discomfort, Grimacing Pain Intervention(s): Limited activity within patient's tolerance, Monitored during session     Hand Dominance Right   Extremity/Trunk Assessment Upper Extremity Assessment Upper Extremity Assessment: Overall WFL for tasks assessed   Lower Extremity Assessment Lower Extremity Assessment: Defer to PT evaluation   Cervical / Trunk Assessment Cervical / Trunk Assessment: Normal   Communication Communication Communication: No difficulties   Cognition Arousal/Alertness: Awake/alert Behavior During Therapy: WFL for tasks assessed/performed Overall Cognitive Status: Within Functional Limits for tasks assessed                                       General Comments  VSS on RA    Exercises     Shoulder Instructions      Home Living Family/patient expects to be discharged to:: Private residence Living Arrangements: Alone Available Help at Discharge: Personal care attendant;Available PRN/intermittently;Family (5 days a week for 5 hours a day. 4 children that live within 3 miles.) Type of Home: House Home Access: Ramped entrance (with rails and wall)     Home Layout: Two level;Able to live on main level with bedroom/bathroom Alternate Level Stairs-Number of Steps: flight Alternate Level Stairs-Rails:  (Pt states that he does not go upstairs at all.) Bathroom Shower/Tub:  Producer, television/film/video: Handicapped height Bathroom Accessibility: Yes How Accessible: Accessible via walker Home Equipment: Rollator (4 wheels);Cane - quad;Cane - single point;BSC/3in1;Shower seat;Grab bars - toilet;Grab bars - tub/shower;Hand held shower head;Wheelchair - manual          Prior Functioning/Environment Prior Level of Function : Independent/Modified Independent;History of Falls (last six months) (Pt reports at least 12 falls in the last 6 months. Pt states that he typically falls backwards and has difficulty getting up)             Mobility Comments: Mod I rollator around the house ADLs Comments: PCA helping with iADLs since stroke last year, son provides supervision during bathing, ind in dressing        OT Problem List: Decreased safety awareness;Impaired balance (sitting and/or standing)      OT Treatment/Interventions: Self-care/ADL training;Therapeutic activities;Therapeutic exercise;Energy conservation;Cognitive remediation/compensation;Visual/perceptual remediation/compensation;DME and/or AE instruction;Balance training;Patient/family education    OT Goals(Current goals can be found in the care plan section) Acute Rehab OT Goals Patient Stated Goal: To return home and get stronger OT Goal Formulation: With patient Time For Goal Achievement: 01/07/23 Potential to Achieve Goals: Good  OT Frequency: Min 1X/week    Co-evaluation              AM-PAC OT "6 Clicks" Daily Activity     Outcome Measure  Help from another person eating meals?: None Help from another person taking care of personal grooming?: A Little Help from another person toileting, which includes using toliet, bedpan, or urinal?: A Little Help from another person bathing (including washing, rinsing, drying)?: A Little Help from another person to put on and taking off regular upper body clothing?: A Little Help from another person to put on and taking off regular lower body  clothing?: A Little 6 Click Score: 19   End of Session Equipment Utilized During Treatment: Gait belt;Rolling walker (2 wheels) Nurse Communication: Mobility status  Activity Tolerance: Patient tolerated treatment well Patient left: in bed;with call bell/phone within reach;with family/visitor present  OT Visit Diagnosis: Unsteadiness on feet (R26.81);History of falling (Z91.81)                Time: 4098-1191 OT Time Calculation (min): 58 min Charges:  OT General Charges $OT Visit: 1 Visit OT Evaluation $OT Eval Moderate Complexity: 1 Mod OT Treatments $Self Care/Home Management : 8-22 mins $Therapeutic Activity: 23-37 mins  12/24/2022  AB, OTR/L  Acute Rehabilitation Services  Office: [phone]   Tristan Schroeder 12/24/2022, 4:10 PM

## 2022-12-24 NOTE — TOC Initial Note (Signed)
Transition of Care (TOC) - Initial/Assessment Note    Patient Details  Name: Mark Jackson. MRN: [id number] Date of Birth: Feb 28, 1930  Transition of Care [facility]) CM/SW Contact:    [name], RN Phone Number: 12/24/2022, 2:10 PM  Clinical Narrative:                 Spoke with patient over phone earlier in day regarding discharge planning. Doctor came in, so Will call him back. Called back and he was at a test, however spoke to daughter who was in the room.  She feels he has all DME needed at home nad would like Home Health, a agency who works best with his insurance. Notified [facility] and they will service the patient.  Messaged MD for F2F orders  Expected Discharge Plan: Home w Home Health Services Barriers to Discharge: Continued Medical Work up   Patient Goals and CMS Choice            Expected Discharge Plan and Services   Discharge Planning Services: CM Consult   Living arrangements for the past 2 months: Single Family Home                           HH Arranged: PT, OT HH Agency: [facility] Date [facility] Agency Contacted: 12/24/22 Time HH Agency Contacted: 1230 Representative spoke with at [facility] Agency: Kandee Keen  Prior Living Arrangements/Services Living arrangements for the past 2 months: Single Family Home   Patient language and need for interpreter reviewed:: Yes        Need for Family Participation in Patient Care: Yes (Comment) Care giver support system in place?: Yes (comment) Current home services: DME (walker, cane) Criminal Activity/Legal Involvement Pertinent to Current Situation/Hospitalization: No - Comment as needed  Activities of Daily Living Home Assistive Devices/Equipment: Environmental consultant (specify type), Cane (specify quad or straight) ADL Screening (condition at time of admission) Patient's cognitive ability adequate to safely complete daily activities?: Yes Is the patient deaf or have difficulty hearing?: No Does the patient have  difficulty seeing, even when wearing glasses/contacts?: No Does the patient have difficulty concentrating, remembering, or making decisions?: No Patient able to express need for assistance with ADLs?: Yes Does the patient have difficulty dressing or bathing?: No Independently performs ADLs?: Yes (appropriate for developmental age) Does the patient have difficulty walking or climbing stairs?: Yes Weakness of Legs: Both Weakness of Arms/Hands: None  Permission Sought/Granted                  Emotional Assessment       Orientation: : Oriented to Self, Oriented to Place Alcohol / Substance Use: Not Applicable Psych Involvement: No (comment)  Admission diagnosis:  Syncope [R55] NSTEMI (non-ST elevation myocardial infarction) (HCC) [I21.4] Multiple skin tears [T14.8XXA] Facial laceration, initial encounter [S01.81XA] Fall, initial encounter [W19.XXXA] Patient Active Problem List   Diagnosis Date Noted   Demand ischemia 12/24/2022   Genetic testing 12/22/2022   Gait disturbance, post-stroke 07/28/2022   B12 neuropathy (HCC) 07/28/2022   Right hemiparesis (HCC) 03/05/2022   Situational depression 03/05/2022   Loud snoring 02/04/2022   Left thalamic infarction (HCC) 12/29/2021   Thrombocytopenia, unspecified (HCC) 12/11/2021   Hematuria 12/11/2020   Acute on chronic diastolic heart failure (HCC) 12/12/2019   S/P TAVR (transcatheter aortic valve replacement) 12/12/2019   Syncope 11/27/2019   Fatigue 11/08/2017   Hyperglycemia 09/27/2017   Bilateral lower extremity edema 04/15/2017   B12 deficiency  01/06/2017   Chronic diarrhea 01/06/2017   BPH associated with nocturia 06/15/2016   Crescendo angina (HCC) 06/01/2016   Perianal dermatitis 06/19/2015   Rectal bleeding 04/25/2015   CAD S/P DES PCI to proximal LAD 03/22/2015   Aortic stenosis 03/14/2015   Dyspnea 08/31/2014   Long term current use of anticoagulant therapy 08/27/2014   PAF (paroxysmal atrial fibrillation)  (HCC) 08/20/2014    Class: Diagnosis of   Chronic kidney disease (CKD), stage IV (severe) (HCC) 08/20/2014   Hereditary and idiopathic peripheral neuropathy 01/12/2014   H/O syncope 09/03/2013   Right-sided carotid artery disease (HCC) 03/02/2013   Hyperlipidemia with target LDL less than 70 03/02/2013   Migraine without aura 10/26/2012   Macrocytic anemia 10/23/2008   GLAUCOMA 10/23/2008   Essential hypertension 10/22/2008   Hemorrhoids 10/22/2008   Arthropathy 10/22/2008   Excessive daytime sleepiness 10/22/2008   Personal history of prostate cancer 10/22/2008   History of colonic polyps 10/22/2008   PCP:  Shelva Majestic, MD Pharmacy:   [facility] [place], [place] - [address] [place] [address] Phone: [phone] Fax: [phone]     Social Determinants of Health (SDOH) Social History: SDOH Screenings   Food Insecurity: No Food Insecurity (12/24/2022)  Housing: Low Risk  (12/24/2022)  Transportation Needs: No Transportation Needs (12/24/2022)  Utilities: Not At Risk (12/24/2022)  Depression (PHQ2-9): Medium Risk (11/26/2022)  Financial Resource Strain: Low Risk  (11/26/2022)  Physical Activity: Sufficiently Active (11/26/2022)  Social Connections: Moderately Integrated (11/26/2022)  Stress: No Stress Concern Present (11/26/2022)  Tobacco Use: Medium Risk (12/24/2022)   SDOH Interventions:     Readmission Risk Interventions     No data to display

## 2022-12-24 NOTE — Progress Notes (Signed)
PROGRESS NOTE    Mark Jackson.  UVO:[id number] DOB: December 02, 1929 DOA: 12/23/2022 PCP: Shelva Majestic, MD   Brief Narrative:   89 year old with history of essential hypertension, CAD status post stenting, HTN, CKD stage IIIb, paroxysmal atrial fibrillation on Eliquis, aortic stenosis status post TAVR, migraine comes to the hospital for recurrent falls.  Initially slightly orthostatic requiring IV fluids.  Cardiology team was consulted as well.  Assessment & Plan:  Principal Problem:   Syncope Active Problems:   Essential hypertension   PAF (paroxysmal atrial fibrillation) (HCC)   Chronic kidney disease (CKD), stage IV (severe) (HCC)   Acute on chronic diastolic heart failure (HCC)   Gait disturbance, post-stroke   Demand ischemia    Recurrent syncope - I suspect multifactorial in nature but autonomic dysfunction could certainly be playing a big role here.  Will get echocardiogram.  Does have prior history of right-sided carotid stenosis therefore we will repeat carotid ultrasound.  Holding fluids, compression stockings.  PT/OT evaluation.  Elevated troponin - Doubt this is ACS.  Likely demand ischemia.  Seen by cardiology.  Obtaining echocardiogram.  Acute diastolic CHF - Some exacerbation.  On IV Lasix.  Checking BNP.  Paroxysmal atrial fibrillation with history of CVA - Currently on Cardizem which was reduced.  Continue Eliquis.  CAD status post PCI in 2016 and 2017 - On Eliquis and statin  Severe aortic stenosis status post TAVR 2021 - Echo ordered  Chronic kidney disease stage IV - Creatinine around baseline of 2.7.       DVT prophylaxis: Eliquis Code Status: Full code Family Communication: Daughter at bedside Continue hospital stay for syncope evaluation.  Getting IV Lasix.       Diet Orders (From admission, onward)     Start     Ordered   12/23/22 1615  Diet Heart Room service appropriate? Yes; Fluid consistency: Thin; Fluid restriction: 1800 mL  Fluid  Diet effective now       Question Answer Comment  Room service appropriate? Yes   Fluid consistency: Thin   Fluid restriction: 1800 mL Fluid      12/23/22 1615            Subjective:  Seen and examined at bedside.  Is any complaints at this time  Examination:  General exam: Appears calm and comfortable  Respiratory system: Mild bibasilar crackles Cardiovascular system: S1 & S2 heard, RRR. No JVD, murmurs, rubs, gallops or clicks. No pedal edema. Gastrointestinal system: Abdomen is nondistended, soft and nontender. No organomegaly or masses felt. Normal bowel sounds heard. Central nervous system: Alert and oriented. No focal neurological deficits. Extremities: Symmetric 5 x 5 power. Skin: No rashes, lesions or ulcers.  Laceration on his face noted Psychiatry: Judgement and insight appear normal. Mood & affect appropriate.  Objective: Vitals:   12/23/22 2116 12/23/22 2119 12/24/22 0031 12/24/22 0332  BP: (!) 166/75 (!) 161/69 (!) 165/71 (!) 157/68  Pulse: 69 69 61 69  Resp: 18 15 14 11   Temp:   98.1 F (36.7 C) 97.9 F (36.6 C)  TempSrc:   Oral Oral  SpO2: 100% 99% 99% 97%  Weight:    85 kg  Height:        Intake/Output Summary (Last 24 hours) at 12/24/2022 1119 Last data filed at 12/24/2022 0400 Gross per 24 hour  Intake 483 ml  Output 1300 ml  Net -817 ml   Filed Weights   12/23/22 1802 12/24/22 0332  Weight: 85.8 kg 85 kg  Scheduled Meds:  apixaban  2.5 mg Oral BID   bacitracin   Topical BID   diltiazem  120 mg Oral Daily   ezetimibe  10 mg Oral Daily   FLUoxetine  10 mg Oral Daily   furosemide  40 mg Intravenous Daily   hydrALAZINE  10 mg Oral Q8H   isosorbide mononitrate  60 mg Oral Daily   lidocaine  1 patch Transdermal Q24H   rosuvastatin  10 mg Oral Daily   sodium chloride flush  3 mL Intravenous Q12H   topiramate  100 mg Oral BID   Continuous Infusions:  sodium chloride      Nutritional status     Body mass index is 28.51  kg/m.  Data Reviewed:   CBC: Recent Labs  Lab 12/23/22 1215 12/23/22 1217  WBC 12.3*  --   NEUTROABS 9.4*  --   HGB 11.2* 11.9*  HCT 35.2* 35.0*  MCV 104.8*  --   PLT 136*  --    Basic Metabolic Panel: Recent Labs  Lab 12/23/22 1215 12/23/22 1217 12/24/22 0047  NA 139 140 137  K 4.6 4.7 4.3  CL 108 110 107  CO2 20*  --  20*  GLUCOSE 120* 113* 118*  BUN 52* 45* 49*  CREATININE 2.71* 3.00* 2.59*  CALCIUM 8.5*  --  8.3*   GFR: Estimated Creatinine Clearance: 20.1 mL/min (A) (by C-G formula based on SCr of 2.59 mg/dL (H)). Liver Function Tests: Recent Labs  Lab 12/23/22 1215  AST 22  ALT 13  ALKPHOS 76  BILITOT 0.7  PROT 6.6  ALBUMIN 3.3*   No results for input(s): "LIPASE", "AMYLASE" in the last 168 hours. No results for input(s): "AMMONIA" in the last 168 hours. Coagulation Profile: Recent Labs  Lab 12/23/22 1215  INR 1.2   Cardiac Enzymes: Recent Labs  Lab 12/23/22 1215  CKTOTAL 50   BNP (last 3 results) No results for input(s): "PROBNP" in the last 8760 hours. HbA1C: No results for input(s): "HGBA1C" in the last 72 hours. CBG: Recent Labs  Lab 12/23/22 1221  GLUCAP 95   Lipid Profile: No results for input(s): "CHOL", "HDL", "LDLCALC", "TRIG", "CHOLHDL", "LDLDIRECT" in the last 72 hours. Thyroid Function Tests: No results for input(s): "TSH", "T4TOTAL", "FREET4", "T3FREE", "THYROIDAB" in the last 72 hours. Anemia Panel: No results for input(s): "VITAMINB12", "FOLATE", "FERRITIN", "TIBC", "IRON", "RETICCTPCT" in the last 72 hours. Sepsis Labs: No results for input(s): "PROCALCITON", "LATICACIDVEN" in the last 168 hours.  No results found for this or any previous visit (from the past 240 hour(s)).       Radiology Studies: CT Chest Wo Contrast  Result Date: 12/23/2022 CLINICAL DATA:  Chest trauma, blunt EXAM: CT CHEST WITHOUT CONTRAST TECHNIQUE: Multidetector CT imaging of the chest was performed following the standard protocol  without IV contrast. RADIATION DOSE REDUCTION: This exam was performed according to the departmental dose-optimization program which includes automated exposure control, adjustment of the mA and/or kV according to patient size and/or use of iterative reconstruction technique. COMPARISON:  11/29/2019 CT, 12/23/2022 x-ray FINDINGS: Cardiovascular: Heart size is borderline enlarged. No pericardial effusion. Thoracic aorta is nonaneurysmal. Prior TAVR and coronary artery stent. Atherosclerotic calcifications of the aorta and coronary arteries. Central pulmonary vasculature is nondilated. Mediastinum/Nodes: No enlarged mediastinal or axillary lymph nodes. Thyroid gland, trachea, and esophagus demonstrate no significant findings. Small hiatal hernia. Lungs/Pleura: Lungs are clear. No pleural effusion or pneumothorax. Upper Abdomen: No acute abnormality. Musculoskeletal: Chronic ununited fractures of the posterior right seventh  through tenth ribs. Chronic well healed posterior right eleventh rib fracture. No acute rib fracture. Multilevel thoracic spondylosis. No chest wall hematoma. Bilateral gynecomastia. IMPRESSION: 1. No acute traumatic findings within the chest. 2. Multiple chronic right-sided rib fractures. 3. Small hiatal hernia. 4. Aortic and coronary artery atherosclerosis (ICD10-I70.0). Electronically Signed   By: Duanne Guess D.O.   On: 12/23/2022 15:46   CT Head Wo Contrast  Result Date: 12/23/2022 CLINICAL DATA:  Level 2 trauma EXAM: CT HEAD WITHOUT CONTRAST CT MAXILLOFACIAL WITHOUT CONTRAST CT CERVICAL SPINE WITHOUT CONTRAST TECHNIQUE: Multidetector CT imaging of the head, cervical spine, and maxillofacial structures were performed using the standard protocol without intravenous contrast. Multiplanar CT image reconstructions of the cervical spine and maxillofacial structures were also generated. RADIATION DOSE REDUCTION: This exam was performed according to the departmental dose-optimization program  which includes automated exposure control, adjustment of the mA and/or kV according to patient size and/or use of iterative reconstruction technique. COMPARISON:  None Available. FINDINGS: CT HEAD FINDINGS Brain: No evidence of acute infarction, hemorrhage, extra-axial collection, ventriculomegaly, or mass effect. Generalized cerebral atrophy. Periventricular white matter low attenuation likely secondary to microangiopathy. Left middle cranial fossa arachnoid cyst. Vascular: Cerebrovascular atherosclerotic calcifications are noted. No hyperdense vessels. Skull: Negative for fracture or focal lesion. Sinuses/Orbits: Visualized portions of the orbits are unremarkable. Visualized portions of the paranasal sinuses are unremarkable. Visualized portions of the mastoid air cells are unremarkable. Other: None. CT MAXILLOFACIAL FINDINGS Osseous: No fracture or mandibular dislocation. No destructive process. Orbits: Negative. No traumatic or inflammatory finding. Sinuses: Clear. Soft tissues: Right infraorbital soft tissue swelling. CT CERVICAL SPINE FINDINGS Alignment: Normal. Skull base and vertebrae: No acute fracture. No primary bone lesion or focal pathologic process. Soft tissues and spinal canal: No prevertebral fluid or swelling. No visible canal hematoma. Disc levels: Degenerative disease with disc height loss at C3-4, C4-5, C5-6, C6-7 and C7-T1. Broad-based disc osteophyte complex and foraminal narrowing at C3-4. Broad-based disc osteophyte complex at C5-6, right uncovertebral degenerative changes and mild right foraminal narrowing. Upper chest: Lung apices are clear. Other: No fluid collection or hematoma. IMPRESSION: 1. No acute intracranial pathology. 2. No acute osseous injury of the facial bones. 3. No acute osseous injury of the cervical spine. 4. Cervical spine spondylosis as described above. Electronically Signed   By: Elige Ko M.D.   On: 12/23/2022 13:31   CT Cervical Spine Wo Contrast  Result Date:  12/23/2022 CLINICAL DATA:  Level 2 trauma EXAM: CT HEAD WITHOUT CONTRAST CT MAXILLOFACIAL WITHOUT CONTRAST CT CERVICAL SPINE WITHOUT CONTRAST TECHNIQUE: Multidetector CT imaging of the head, cervical spine, and maxillofacial structures were performed using the standard protocol without intravenous contrast. Multiplanar CT image reconstructions of the cervical spine and maxillofacial structures were also generated. RADIATION DOSE REDUCTION: This exam was performed according to the departmental dose-optimization program which includes automated exposure control, adjustment of the mA and/or kV according to patient size and/or use of iterative reconstruction technique. COMPARISON:  None Available. FINDINGS: CT HEAD FINDINGS Brain: No evidence of acute infarction, hemorrhage, extra-axial collection, ventriculomegaly, or mass effect. Generalized cerebral atrophy. Periventricular white matter low attenuation likely secondary to microangiopathy. Left middle cranial fossa arachnoid cyst. Vascular: Cerebrovascular atherosclerotic calcifications are noted. No hyperdense vessels. Skull: Negative for fracture or focal lesion. Sinuses/Orbits: Visualized portions of the orbits are unremarkable. Visualized portions of the paranasal sinuses are unremarkable. Visualized portions of the mastoid air cells are unremarkable. Other: None. CT MAXILLOFACIAL FINDINGS Osseous: No fracture or mandibular dislocation. No destructive process.  Orbits: Negative. No traumatic or inflammatory finding. Sinuses: Clear. Soft tissues: Right infraorbital soft tissue swelling. CT CERVICAL SPINE FINDINGS Alignment: Normal. Skull base and vertebrae: No acute fracture. No primary bone lesion or focal pathologic process. Soft tissues and spinal canal: No prevertebral fluid or swelling. No visible canal hematoma. Disc levels: Degenerative disease with disc height loss at C3-4, C4-5, C5-6, C6-7 and C7-T1. Broad-based disc osteophyte complex and foraminal  narrowing at C3-4. Broad-based disc osteophyte complex at C5-6, right uncovertebral degenerative changes and mild right foraminal narrowing. Upper chest: Lung apices are clear. Other: No fluid collection or hematoma. IMPRESSION: 1. No acute intracranial pathology. 2. No acute osseous injury of the facial bones. 3. No acute osseous injury of the cervical spine. 4. Cervical spine spondylosis as described above. Electronically Signed   By: Elige Ko M.D.   On: 12/23/2022 13:31   CT Maxillofacial Wo Contrast  Result Date: 12/23/2022 CLINICAL DATA:  Level 2 trauma EXAM: CT HEAD WITHOUT CONTRAST CT MAXILLOFACIAL WITHOUT CONTRAST CT CERVICAL SPINE WITHOUT CONTRAST TECHNIQUE: Multidetector CT imaging of the head, cervical spine, and maxillofacial structures were performed using the standard protocol without intravenous contrast. Multiplanar CT image reconstructions of the cervical spine and maxillofacial structures were also generated. RADIATION DOSE REDUCTION: This exam was performed according to the departmental dose-optimization program which includes automated exposure control, adjustment of the mA and/or kV according to patient size and/or use of iterative reconstruction technique. COMPARISON:  None Available. FINDINGS: CT HEAD FINDINGS Brain: No evidence of acute infarction, hemorrhage, extra-axial collection, ventriculomegaly, or mass effect. Generalized cerebral atrophy. Periventricular white matter low attenuation likely secondary to microangiopathy. Left middle cranial fossa arachnoid cyst. Vascular: Cerebrovascular atherosclerotic calcifications are noted. No hyperdense vessels. Skull: Negative for fracture or focal lesion. Sinuses/Orbits: Visualized portions of the orbits are unremarkable. Visualized portions of the paranasal sinuses are unremarkable. Visualized portions of the mastoid air cells are unremarkable. Other: None. CT MAXILLOFACIAL FINDINGS Osseous: No fracture or mandibular dislocation. No  destructive process. Orbits: Negative. No traumatic or inflammatory finding. Sinuses: Clear. Soft tissues: Right infraorbital soft tissue swelling. CT CERVICAL SPINE FINDINGS Alignment: Normal. Skull base and vertebrae: No acute fracture. No primary bone lesion or focal pathologic process. Soft tissues and spinal canal: No prevertebral fluid or swelling. No visible canal hematoma. Disc levels: Degenerative disease with disc height loss at C3-4, C4-5, C5-6, C6-7 and C7-T1. Broad-based disc osteophyte complex and foraminal narrowing at C3-4. Broad-based disc osteophyte complex at C5-6, right uncovertebral degenerative changes and mild right foraminal narrowing. Upper chest: Lung apices are clear. Other: No fluid collection or hematoma. IMPRESSION: 1. No acute intracranial pathology. 2. No acute osseous injury of the facial bones. 3. No acute osseous injury of the cervical spine. 4. Cervical spine spondylosis as described above. Electronically Signed   By: Elige Ko M.D.   On: 12/23/2022 13:31   DG Chest Portable 1 View  Result Date: 12/23/2022 CLINICAL DATA:  Chest pain EXAM: PORTABLE CHEST 1 VIEW COMPARISON:  06/14/2022 FINDINGS: No focal consolidation. No pleural effusion or pneumothorax. Heart and mediastinal contours are unremarkable. Prior TAVR. No acute osseous abnormality. IMPRESSION: No active disease. Electronically Signed   By: Elige Ko M.D.   On: 12/23/2022 12:56   DG Pelvis Portable  Result Date: 12/23/2022 CLINICAL DATA:  Chest pain, trauma, fall. EXAM: PORTABLE PELVIS 1-2 VIEWS COMPARISON:  04/28/2009. FINDINGS: Hip joint space is uniform bilaterally. No acute osseous abnormality. Brachytherapy seeds in the prostate. IMPRESSION: No acute findings. Electronically Signed  By: Leanna Battles M.D.   On: 12/23/2022 12:55           LOS: 0 days   Time spent= 35 mins    Jamontae Thwaites Joline Maxcy, MD [facility]  If 7PM-7AM, please contact night-coverage  12/24/2022, 11:19 AM

## 2022-12-24 NOTE — Progress Notes (Signed)
Echocardiogram 2D Echocardiogram has been performed.  Mark Jackson 12/24/2022, 12:22 PM

## 2022-12-24 NOTE — Progress Notes (Addendum)
Rounding Note    Patient Name: Mark Jackson. Date of Encounter: 12/24/2022  [facility] Cardiologist: Bryan Lemma, MD   Subjective   Net negative 800cc yesterday, Cr mildly improved (3.0 >2.59).  Denies any further lightheadedness or syncope  Inpatient Medications    Scheduled Meds:  apixaban  2.5 mg Oral BID   bacitracin   Topical BID   diltiazem  120 mg Oral Daily   ezetimibe  10 mg Oral Daily   FLUoxetine  10 mg Oral Daily   furosemide  40 mg Intravenous Daily   hydrALAZINE  10 mg Oral Q8H   isosorbide mononitrate  60 mg Oral Daily   lidocaine  1 patch Transdermal Q24H   rosuvastatin  10 mg Oral Daily   sodium chloride flush  3 mL Intravenous Q12H   topiramate  100 mg Oral BID   Continuous Infusions:  sodium chloride     PRN Meds: sodium chloride, acetaminophen, acetaminophen, guaiFENesin, hydrALAZINE, ipratropium-albuterol, metoprolol tartrate, nitroGLYCERIN, ondansetron (ZOFRAN) IV, oxyCODONE, senna-docusate, sodium chloride flush, traMADol   Vital Signs    Vitals:   12/23/22 2116 12/23/22 2119 12/24/22 0031 12/24/22 0332  BP: (!) 166/75 (!) 161/69 (!) 165/71 (!) 157/68  Pulse: 69 69 61 69  Resp: 18 15 14 11   Temp:   98.1 F (36.7 C) 97.9 F (36.6 C)  TempSrc:   Oral Oral  SpO2: 100% 99% 99% 97%  Weight:    85 kg  Height:        Intake/Output Summary (Last 24 hours) at 12/24/2022 0938 Last data filed at 12/24/2022 0400 Gross per 24 hour  Intake 483 ml  Output 1300 ml  Net -817 ml      12/24/2022    3:32 AM 12/23/2022    6:02 PM 11/26/2022   11:44 AM  Last 3 Weights  Weight (lbs) 187 lb 8 oz 189 lb 2.5 oz 188 lb  Weight (kg) 85.049 kg 85.8 kg 85.276 kg      Telemetry    NSR - Personally Reviewed  ECG    No new ECG - Personally Reviewed  Physical Exam   GEN: No acute distress.   Neck:+ JVD Cardiac: RRR, 2/6 systolic murmur Respiratory: Clear to auscultation bilaterally. GI: Soft, nontende MS: 2+ edema Neuro:   Nonfocal  Psych: Normal affect   Labs    High Sensitivity Troponin:   Recent Labs  Lab 12/23/22 1215 12/23/22 1450 12/24/22 0536  TROPONINIHS 214* 175* 147*     Chemistry Recent Labs  Lab 12/23/22 1215 12/23/22 1217 12/24/22 0047  NA 139 140 137  K 4.6 4.7 4.3  CL 108 110 107  CO2 20*  --  20*  GLUCOSE 120* 113* 118*  BUN 52* 45* 49*  CREATININE 2.71* 3.00* 2.59*  CALCIUM 8.5*  --  8.3*  PROT 6.6  --   --   ALBUMIN 3.3*  --   --   AST 22  --   --   ALT 13  --   --   ALKPHOS 76  --   --   BILITOT 0.7  --   --   GFRNONAA 22*  --  23*  ANIONGAP 11  --  10    Lipids No results for input(s): "CHOL", "TRIG", "HDL", "LABVLDL", "LDLCALC", "CHOLHDL" in the last 168 hours.  Hematology Recent Labs  Lab 12/23/22 1215 12/23/22 1217  WBC 12.3*  --   RBC 3.36*  --   HGB 11.2* 11.9*  HCT 35.2* 35.0*  MCV 104.8*  --   MCH 33.3  --   MCHC 31.8  --   RDW 13.2  --   PLT 136*  --    Thyroid No results for input(s): "TSH", "FREET4" in the last 168 hours.  BNPNo results for input(s): "BNP", "PROBNP" in the last 168 hours.  DDimer No results for input(s): "DDIMER" in the last 168 hours.   Radiology    CT Chest Wo Contrast  Result Date: 12/23/2022 CLINICAL DATA:  Chest trauma, blunt EXAM: CT CHEST WITHOUT CONTRAST TECHNIQUE: Multidetector CT imaging of the chest was performed following the standard protocol without IV contrast. RADIATION DOSE REDUCTION: This exam was performed according to the departmental dose-optimization program which includes automated exposure control, adjustment of the mA and/or kV according to patient size and/or use of iterative reconstruction technique. COMPARISON:  11/29/2019 CT, 12/23/2022 x-ray FINDINGS: Cardiovascular: Heart size is borderline enlarged. No pericardial effusion. Thoracic aorta is nonaneurysmal. Prior TAVR and coronary artery stent. Atherosclerotic calcifications of the aorta and coronary arteries. Central pulmonary vasculature is  nondilated. Mediastinum/Nodes: No enlarged mediastinal or axillary lymph nodes. Thyroid gland, trachea, and esophagus demonstrate no significant findings. Small hiatal hernia. Lungs/Pleura: Lungs are clear. No pleural effusion or pneumothorax. Upper Abdomen: No acute abnormality. Musculoskeletal: Chronic ununited fractures of the posterior right seventh through tenth ribs. Chronic well healed posterior right eleventh rib fracture. No acute rib fracture. Multilevel thoracic spondylosis. No chest wall hematoma. Bilateral gynecomastia. IMPRESSION: 1. No acute traumatic findings within the chest. 2. Multiple chronic right-sided rib fractures. 3. Small hiatal hernia. 4. Aortic and coronary artery atherosclerosis (ICD10-I70.0). Electronically Signed   By: Duanne Guess D.O.   On: 12/23/2022 15:46   CT Head Wo Contrast  Result Date: 12/23/2022 CLINICAL DATA:  Level 2 trauma EXAM: CT HEAD WITHOUT CONTRAST CT MAXILLOFACIAL WITHOUT CONTRAST CT CERVICAL SPINE WITHOUT CONTRAST TECHNIQUE: Multidetector CT imaging of the head, cervical spine, and maxillofacial structures were performed using the standard protocol without intravenous contrast. Multiplanar CT image reconstructions of the cervical spine and maxillofacial structures were also generated. RADIATION DOSE REDUCTION: This exam was performed according to the departmental dose-optimization program which includes automated exposure control, adjustment of the mA and/or kV according to patient size and/or use of iterative reconstruction technique. COMPARISON:  None Available. FINDINGS: CT HEAD FINDINGS Brain: No evidence of acute infarction, hemorrhage, extra-axial collection, ventriculomegaly, or mass effect. Generalized cerebral atrophy. Periventricular white matter low attenuation likely secondary to microangiopathy. Left middle cranial fossa arachnoid cyst. Vascular: Cerebrovascular atherosclerotic calcifications are noted. No hyperdense vessels. Skull: Negative for  fracture or focal lesion. Sinuses/Orbits: Visualized portions of the orbits are unremarkable. Visualized portions of the paranasal sinuses are unremarkable. Visualized portions of the mastoid air cells are unremarkable. Other: None. CT MAXILLOFACIAL FINDINGS Osseous: No fracture or mandibular dislocation. No destructive process. Orbits: Negative. No traumatic or inflammatory finding. Sinuses: Clear. Soft tissues: Right infraorbital soft tissue swelling. CT CERVICAL SPINE FINDINGS Alignment: Normal. Skull base and vertebrae: No acute fracture. No primary bone lesion or focal pathologic process. Soft tissues and spinal canal: No prevertebral fluid or swelling. No visible canal hematoma. Disc levels: Degenerative disease with disc height loss at C3-4, C4-5, C5-6, C6-7 and C7-T1. Broad-based disc osteophyte complex and foraminal narrowing at C3-4. Broad-based disc osteophyte complex at C5-6, right uncovertebral degenerative changes and mild right foraminal narrowing. Upper chest: Lung apices are clear. Other: No fluid collection or hematoma. IMPRESSION: 1. No acute intracranial pathology. 2. No acute osseous injury  of the facial bones. 3. No acute osseous injury of the cervical spine. 4. Cervical spine spondylosis as described above. Electronically Signed   By: Elige Ko M.D.   On: 12/23/2022 13:31   CT Cervical Spine Wo Contrast  Result Date: 12/23/2022 CLINICAL DATA:  Level 2 trauma EXAM: CT HEAD WITHOUT CONTRAST CT MAXILLOFACIAL WITHOUT CONTRAST CT CERVICAL SPINE WITHOUT CONTRAST TECHNIQUE: Multidetector CT imaging of the head, cervical spine, and maxillofacial structures were performed using the standard protocol without intravenous contrast. Multiplanar CT image reconstructions of the cervical spine and maxillofacial structures were also generated. RADIATION DOSE REDUCTION: This exam was performed according to the departmental dose-optimization program which includes automated exposure control, adjustment of  the mA and/or kV according to patient size and/or use of iterative reconstruction technique. COMPARISON:  None Available. FINDINGS: CT HEAD FINDINGS Brain: No evidence of acute infarction, hemorrhage, extra-axial collection, ventriculomegaly, or mass effect. Generalized cerebral atrophy. Periventricular white matter low attenuation likely secondary to microangiopathy. Left middle cranial fossa arachnoid cyst. Vascular: Cerebrovascular atherosclerotic calcifications are noted. No hyperdense vessels. Skull: Negative for fracture or focal lesion. Sinuses/Orbits: Visualized portions of the orbits are unremarkable. Visualized portions of the paranasal sinuses are unremarkable. Visualized portions of the mastoid air cells are unremarkable. Other: None. CT MAXILLOFACIAL FINDINGS Osseous: No fracture or mandibular dislocation. No destructive process. Orbits: Negative. No traumatic or inflammatory finding. Sinuses: Clear. Soft tissues: Right infraorbital soft tissue swelling. CT CERVICAL SPINE FINDINGS Alignment: Normal. Skull base and vertebrae: No acute fracture. No primary bone lesion or focal pathologic process. Soft tissues and spinal canal: No prevertebral fluid or swelling. No visible canal hematoma. Disc levels: Degenerative disease with disc height loss at C3-4, C4-5, C5-6, C6-7 and C7-T1. Broad-based disc osteophyte complex and foraminal narrowing at C3-4. Broad-based disc osteophyte complex at C5-6, right uncovertebral degenerative changes and mild right foraminal narrowing. Upper chest: Lung apices are clear. Other: No fluid collection or hematoma. IMPRESSION: 1. No acute intracranial pathology. 2. No acute osseous injury of the facial bones. 3. No acute osseous injury of the cervical spine. 4. Cervical spine spondylosis as described above. Electronically Signed   By: Elige Ko M.D.   On: 12/23/2022 13:31   CT Maxillofacial Wo Contrast  Result Date: 12/23/2022 CLINICAL DATA:  Level 2 trauma EXAM: CT HEAD  WITHOUT CONTRAST CT MAXILLOFACIAL WITHOUT CONTRAST CT CERVICAL SPINE WITHOUT CONTRAST TECHNIQUE: Multidetector CT imaging of the head, cervical spine, and maxillofacial structures were performed using the standard protocol without intravenous contrast. Multiplanar CT image reconstructions of the cervical spine and maxillofacial structures were also generated. RADIATION DOSE REDUCTION: This exam was performed according to the departmental dose-optimization program which includes automated exposure control, adjustment of the mA and/or kV according to patient size and/or use of iterative reconstruction technique. COMPARISON:  None Available. FINDINGS: CT HEAD FINDINGS Brain: No evidence of acute infarction, hemorrhage, extra-axial collection, ventriculomegaly, or mass effect. Generalized cerebral atrophy. Periventricular white matter low attenuation likely secondary to microangiopathy. Left middle cranial fossa arachnoid cyst. Vascular: Cerebrovascular atherosclerotic calcifications are noted. No hyperdense vessels. Skull: Negative for fracture or focal lesion. Sinuses/Orbits: Visualized portions of the orbits are unremarkable. Visualized portions of the paranasal sinuses are unremarkable. Visualized portions of the mastoid air cells are unremarkable. Other: None. CT MAXILLOFACIAL FINDINGS Osseous: No fracture or mandibular dislocation. No destructive process. Orbits: Negative. No traumatic or inflammatory finding. Sinuses: Clear. Soft tissues: Right infraorbital soft tissue swelling. CT CERVICAL SPINE FINDINGS Alignment: Normal. Skull base and vertebrae: No acute fracture. No  primary bone lesion or focal pathologic process. Soft tissues and spinal canal: No prevertebral fluid or swelling. No visible canal hematoma. Disc levels: Degenerative disease with disc height loss at C3-4, C4-5, C5-6, C6-7 and C7-T1. Broad-based disc osteophyte complex and foraminal narrowing at C3-4. Broad-based disc osteophyte complex at C5-6,  right uncovertebral degenerative changes and mild right foraminal narrowing. Upper chest: Lung apices are clear. Other: No fluid collection or hematoma. IMPRESSION: 1. No acute intracranial pathology. 2. No acute osseous injury of the facial bones. 3. No acute osseous injury of the cervical spine. 4. Cervical spine spondylosis as described above. Electronically Signed   By: Elige Ko M.D.   On: 12/23/2022 13:31   DG Chest Portable 1 View  Result Date: 12/23/2022 CLINICAL DATA:  Chest pain EXAM: PORTABLE CHEST 1 VIEW COMPARISON:  06/14/2022 FINDINGS: No focal consolidation. No pleural effusion or pneumothorax. Heart and mediastinal contours are unremarkable. Prior TAVR. No acute osseous abnormality. IMPRESSION: No active disease. Electronically Signed   By: Elige Ko M.D.   On: 12/23/2022 12:56   DG Pelvis Portable  Result Date: 12/23/2022 CLINICAL DATA:  Chest pain, trauma, fall. EXAM: PORTABLE PELVIS 1-2 VIEWS COMPARISON:  04/28/2009. FINDINGS: Hip joint space is uniform bilaterally. No acute osseous abnormality. Brachytherapy seeds in the prostate. IMPRESSION: No acute findings. Electronically Signed   By: Leanna Battles M.D.   On: 12/23/2022 12:55    Cardiac Studies     Patient Profile     89 y.o. male with a hx of coronary artery disease, atrial fibrillation, s/p TAVR, chronic diastolic heart failure, CKD stage IV, chronic anemia, carotid artery disease, paroxysmal atrial fibrillation on eliquis who is being seen for the evaluation of chest pain and syncope   Assessment & Plan    Syncope: Patient was walking with his cane for a few steps and fell down on his right side. Does not remember any symptoms of dizziness/lightheadedness or palpitations prior to the fall. No seizure activity. No postictal confusion. After he regained consciousness, he complained of pain on the right side of his chest which probably is secondary to fall. EKG showed prolonged first-degree AV block with PR  interval 400 ms (prior EKGs that showed PR interval around 170-200 msec in 2023). Orthostatics unremarkable -Decreased the dose of diltiazem from 240 mg to 120 mg once daily. He was previously on 120 mg which was increased to 240 mg in 01/2022. Recommend 2-week live event monitor upon discharge. -Echocardiogram -Carotid duplex   Acute on chronic diastolic heart failure exacerbation: Volume overloaded on exam.  Most recent echo 12/2021 showed normal biventricular function -Continue IV lasix 40 mg daily -Check echo -Check BNP   Troponin elevation: mild troponin elevation, downtrending. Likely demand ischemia in setting of decompensated heart failure   Paroxysmal A-fib: History of CVA in 2023 -Decrease the dose of diltiazem from 240 mg to 120 mg once daily (due to prolonged PR interval 400 msec and fall) -Okay to continue Eliquis 2.5 mg twice daily (benefit outweighs risk), though given history of falls would consider outpatient referral for Watchman evaluation   CAD: s/p LAD PCI in 2016 and 2017 -Not on aspirin due to Eliquis use (will discontinue aspirin 325 mg) -Continue rosuvastatin 10 mg nightly   Severe aortic valve stenosis s/p TAVR in 2021 -F/u echo as above  CKD stage IV: Cr 2.59, appears about baseline (2.5-3.0).  Monitor while diuresing  For questions or updates, please contact [facility] Please consult [email or web address] for contact info under  Signed, Little Ishikawa, MD  12/24/2022, 9:38 AM

## 2022-12-24 NOTE — Progress Notes (Signed)
Carotid duplex has been completed.   Results can be found under chart review under CV PROC. 12/24/2022 3:49 PM Makenzee Choudhry RVT, RDMS

## 2022-12-25 ENCOUNTER — Other Ambulatory Visit (INDEPENDENT_AMBULATORY_CARE_PROVIDER_SITE_OTHER): Payer: BLUE CROSS/BLUE SHIELD

## 2022-12-25 ENCOUNTER — Inpatient Hospital Stay (HOSPITAL_COMMUNITY): Payer: Medicare Other

## 2022-12-25 ENCOUNTER — Other Ambulatory Visit: Payer: Self-pay | Admitting: Cardiology

## 2022-12-25 DIAGNOSIS — R55 Syncope and collapse: Secondary | ICD-10-CM | POA: Diagnosis not present

## 2022-12-25 DIAGNOSIS — I5033 Acute on chronic diastolic (congestive) heart failure: Secondary | ICD-10-CM | POA: Diagnosis not present

## 2022-12-25 DIAGNOSIS — N184 Chronic kidney disease, stage 4 (severe): Secondary | ICD-10-CM | POA: Diagnosis not present

## 2022-12-25 DIAGNOSIS — I6521 Occlusion and stenosis of right carotid artery: Secondary | ICD-10-CM

## 2022-12-25 DIAGNOSIS — I2489 Other forms of acute ischemic heart disease: Secondary | ICD-10-CM | POA: Diagnosis not present

## 2022-12-25 LAB — BASIC METABOLIC PANEL
Anion gap: 7 (ref 5–15)
BUN: 47 mg/dL — ABNORMAL HIGH (ref 8–23)
CO2: 22 mmol/L (ref 22–32)
Calcium: 7.8 mg/dL — ABNORMAL LOW (ref 8.9–10.3)
Chloride: 109 mmol/L (ref 98–111)
Creatinine, Ser: 2.66 mg/dL — ABNORMAL HIGH (ref 0.61–1.24)
GFR, Estimated: 22 mL/min — ABNORMAL LOW (ref >60)
Glucose, Bld: 104 mg/dL — ABNORMAL HIGH (ref 70–99)
Potassium: 3.9 mmol/L (ref 3.5–5.1)
Sodium: 138 mmol/L (ref 135–145)

## 2022-12-25 LAB — CBC
HCT: 30.1 % — ABNORMAL LOW (ref 39.0–52.0)
Hemoglobin: 9.9 g/dL — ABNORMAL LOW (ref 13.0–17.0)
MCH: 33.6 pg (ref 26.0–34.0)
MCHC: 32.9 g/dL (ref 30.0–36.0)
MCV: 102 fL — ABNORMAL HIGH (ref 80.0–100.0)
Platelets: 137 10*3/uL — ABNORMAL LOW (ref 150–400)
RBC: 2.95 MIL/uL — ABNORMAL LOW (ref 4.22–5.81)
RDW: 13.2 % (ref 11.5–15.5)
WBC: 10.2 10*3/uL (ref 4.0–10.5)
nRBC: 0 % (ref 0.0–0.2)

## 2022-12-25 LAB — BRAIN NATRIURETIC PEPTIDE: B Natriuretic Peptide: 208.6 pg/mL — ABNORMAL HIGH (ref 0.0–100.0)

## 2022-12-25 LAB — MAGNESIUM: Magnesium: 2.2 mg/dL (ref 1.7–2.4)

## 2022-12-25 MED ORDER — DILTIAZEM HCL ER COATED BEADS 120 MG PO CP24: 120.0000 mg | ORAL_CAPSULE | Freq: Every day | ORAL | 0 refills | Status: DC

## 2022-12-25 MED ORDER — FUROSEMIDE 40 MG PO TABS
40.0000 mg | ORAL_TABLET | Freq: Every day | ORAL | Status: DC
  Administered 2022-12-25: 40 mg via ORAL
  Filled 2022-12-25: qty 1

## 2022-12-25 NOTE — Progress Notes (Signed)
Heart Failure Navigator Progress Note  Assessed for Heart & Vascular TOC clinic readiness.  Patient does not meet criteria due to EF 55-60%, has a hospital [facility] follow up appointment on 01/08/2023. .   Navigator available for reassessment of patient.   Rhae Hammock, BSN, Scientist, clinical (histocompatibility and immunogenetics) Only

## 2022-12-25 NOTE — Discharge Summary (Signed)
Physician Discharge Summary  [name] ZOX:[id number] DOB: 16-Apr-1930 DOA: 12/23/2022  PCP: Shelva Majestic, MD  Admit date: 12/23/2022 Discharge date: 12/25/2022  Admitted From: Home Disposition:  Home  Recommendations for Outpatient Follow-up:  Follow up with PCP in 1-2 weeks Please obtain BMP/CBC in one week your next doctors visit.  Zio patch will be mailed to his house Autonomic dysfunction education provided.  Explained to use thigh-high compression stockings bilaterally Follow-up outpatient vascular, Dr. Butch Penny Cardizem reduced 120 mg   Discharge Condition: Stable CODE STATUS: Full Diet recommendation: Heart healthy  Brief/Interim Summary:   89 year old with history of essential hypertension, CAD status post stenting, HTN, CKD stage IIIb, paroxysmal atrial fibrillation on Eliquis, aortic stenosis status post TAVR, migraine comes to the hospital for recurrent falls.  Initially slightly orthostatic requiring IV fluids.  Cardiology team was consulted as well.  Echocardiogram was overall unremarkable but carotid duplex showed right-sided carotid stenosis of 80-99%.  I discussed this with Dr. Juanetta Gosling from [facility].  He states indication for intervention would be CVA but does not result in syncope.  MRI brain was negative.  Medically cleared for discharge with recommendations as stated above.   Assessment & Plan:  Principal Problem:   Syncope Active Problems:   Essential hypertension   PAF (paroxysmal atrial fibrillation) (HCC)   Chronic kidney disease (CKD), stage IV (severe) (HCC)   Acute on chronic diastolic heart failure (HCC)   Gait disturbance, post-stroke   Demand ischemia   Carotid stenosis, right     Recurrent syncope - Could be mild factorial in nature but there is always a suspicion for autonomic dysfunction.  Echocardiogram shows EF of 55 to 60%, normal functioning prosthetic valve.  Cardizem reduced 220 mg daily.  Cardiology is recommending 2 weeks of Zio  patch which will be arranged by their service.  Carotid duplex shows right-sided carotid stenosis 80-99%.  I discussed case with Dr. Juanetta Gosling who advised this does not result in syncope but can be a risk for MCA CVA.  Eventually MRI brain was negative for any acute CVA.  Recommended outpatient follow-up.  Advised to wear thigh-high compression stockings.   Elevated troponin - Doubt this is ACS.  Likely demand ischemia.  Seen by cardiology.  Echocardiogram as above   Acute diastolic CHF - Overall euvolemic now, transition to p.o. Lasix   Paroxysmal atrial fibrillation with history of CVA - Currently on Cardizem which was reduced.  Continue Eliquis.   CAD status post PCI in 2016 and 2017 - On Eliquis and statin   Severe aortic stenosis status post TAVR 2021 - Echo stay for prosthetic valve   Chronic kidney disease stage IV - Creatinine around baseline of 2.7.       Discharge Diagnoses:  Principal Problem:   Syncope Active Problems:   Essential hypertension   PAF (paroxysmal atrial fibrillation) (HCC)   Chronic kidney disease (CKD), stage IV (severe) (HCC)   Acute on chronic diastolic heart failure (HCC)   Gait disturbance, post-stroke   Demand ischemia   Carotid stenosis, right      Consultations: Cardiology  Subjective: Feels well no complaints.  Discharge Exam: Vitals:   12/25/22 0820 12/25/22 1133  BP: (!) 159/79 (!) 149/71  Pulse: 79 85  Resp: 12 20  Temp: 98.2 F (36.8 C)   SpO2: 99% 98%   Vitals:   12/25/22 0056 12/25/22 0500 12/25/22 0820 12/25/22 1133  BP: 133/61 (!) 164/79 (!) 159/79 (!) 149/71  Pulse: 79  79 85  Resp: 18 11 12 20   Temp: 98.5 F (36.9 C) 97.8 F (36.6 C) 98.2 F (36.8 C)   TempSrc: Oral Oral Oral Oral  SpO2: 98% 97% 99% 98%  Weight:  85 kg    Height:        General: Pt is alert, awake, not in acute distress Cardiovascular: RRR, S1/S2 +, no rubs, no gallops Respiratory: CTA bilaterally, no wheezing, no rhonchi Abdominal:  Soft, NT, ND, bowel sounds + Extremities: no edema, no cyanosis  Discharge Instructions   Allergies as of 12/25/2022   No Known Allergies      Medication List     TAKE these medications    acetaminophen 325 MG tablet Commonly known as: TYLENOL Take 2 tablets (650 mg total) by mouth every 4 (four) hours as needed for mild pain (or temp > 37.5 C (99.5 F)).   amoxicillin 500 MG capsule Commonly known as: AMOXIL Take 500 mg by mouth as needed (Use for pre dental procedure).   apixaban 2.5 MG Tabs tablet Commonly known as: Eliquis Take 1 tablet (2.5 mg total) by mouth 2 (two) times daily.   Centrum Silver Adult 50+ Tabs Take 1 tablet by mouth daily.   diclofenac Sodium 1 % Gel Commonly known as: VOLTAREN Apply 2 g topically 4 (four) times daily.   diltiazem 120 MG 24 hr capsule Commonly known as: CARDIZEM CD Take 1 capsule (120 mg total) by mouth daily. Start taking on: Dec 26, 2022 What changed:  medication strength how much to take   ezetimibe 10 MG tablet Commonly known as: Zetia Take 1 tablet (10 mg total) by mouth daily.   ferrous sulfate 325 (65 FE) MG tablet Take 325 mg by mouth daily with breakfast.   FLUoxetine 10 MG capsule Commonly known as: PROZAC Take 1 capsule (10 mg total) by mouth daily.   furosemide 40 MG tablet Commonly known as: LASIX Take 1 tablet (40 mg total) by mouth daily.   isosorbide mononitrate 60 MG 24 hr tablet Commonly known as: IMDUR TAKE ONE TAB DAILY AFTER BREAKFAST. MAY TAKE AN ADDITONAL TAB IN EVENING X2DAYS IF USE NITROGLYCER   nitroGLYCERIN 0.4 MG SL tablet Commonly known as: NITROSTAT ONE TABLET UNDER TONGUE WHEN NEEDED FOR CHEST PAIN. MAY REPEAT IN 5 MINUTES.   rosuvastatin 10 MG tablet Commonly known as: CRESTOR TAKE (1) TABLET DAILY AT BEDTIME.   topiramate 100 MG tablet Commonly known as: TOPAMAX Take 1 tablet (100 mg total) by mouth 2 (two) times daily.   traMADol 50 MG tablet Commonly known as:  ULTRAM Take 50 mg by mouth 3 (three) times daily as needed.        Follow-up Information     [facility] Follow up.   Specialty: Home Health Services Why: Your Home Health company they will call you for set up of services. Contact information: [address] [place] [address] [phone]         Joylene Grapes, NP Follow up on 01/08/2023.   Specialties: Cardiology, Family Medicine Why: Appointment at 10:05 AM Contact information: [address] [place] [address] [phone]         Shelva Majestic, MD Follow up in 1 week(s).   Specialty: Family Medicine Contact information: [address] [place] [address] [phone]         Leonie Douglas, MD. Schedule an appointment as soon as possible for a visit in 1 month(s).   Specialties: Vascular  Surgery, Interventional Cardiology Contact information: [address] [place] [address] [phone]                No Known Allergies  You were cared for by a hospitalist during your hospital stay. If you have any questions about your discharge medications or the care you received while you were in the hospital after you are discharged, you can call the unit and asked to speak with the hospitalist on call if the hospitalist that took care of you is not available. Once you are discharged, your primary care physician will handle any further medical issues. Please note that no refills for any discharge medications will be authorized once you are discharged, as it is imperative that you return to your primary care physician (or establish a relationship with a primary care physician if you do not have one) for your aftercare needs so that they can reassess your need for medications and monitor your lab values.  You were cared for by a hospitalist during your hospital stay. If you have any questions about your discharge medications or the care you received while you  were in the hospital after you are discharged, you can call the unit and asked to speak with the hospitalist on call if the hospitalist that took care of you is not available. Once you are discharged, your primary care physician will handle any further medical issues. Please note that NO REFILLS for any discharge medications will be authorized once you are discharged, as it is imperative that you return to your primary care physician (or establish a relationship with a primary care physician if you do not have one) for your aftercare needs so that they can reassess your need for medications and monitor your lab values.  Please request your Prim.MD to go over all Hospital Tests and Procedure/Radiological results at the follow up, please get all Hospital records sent to your Prim MD by signing hospital release before you go home.  Get CBC, CMP, 2 view Chest X ray checked  by Primary MD during your next visit or SNF MD in 5-7 days ( we routinely change or add medications that can affect your baseline labs and fluid status, therefore we recommend that you get the mentioned basic workup next visit with your PCP, your PCP may decide not to get them or add new tests based on their clinical decision)  On your next visit with your primary care physician please Get Medicines reviewed and adjusted.  If you experience worsening of your admission symptoms, develop shortness of breath, life threatening emergency, suicidal or homicidal thoughts you must seek medical attention immediately by calling 911 or calling your MD immediately  if symptoms less severe.  You Must read complete instructions/literature along with all the possible adverse reactions/side effects for all the Medicines you take and that have been prescribed to you. Take any new Medicines after you have completely understood and accpet all the possible adverse reactions/side effects.   Do not drive, operate heavy machinery, perform activities at heights,  swimming or participation in water activities or provide baby sitting services if your were admitted for syncope or siezures until you have seen by Primary MD or a Neurologist and advised to do so again.  Do not drive when taking Pain medications.   Procedures/Studies: MR BRAIN WO CONTRAST  Result Date: 12/25/2022 CLINICAL DATA:  Transient ischemic attack (TIA) EXAM: MRI HEAD WITHOUT CONTRAST TECHNIQUE: Multiplanar, multiecho pulse sequences of the brain and surrounding structures  were obtained without intravenous contrast. COMPARISON:  MR Head 12/26/21, CT head 12/23/22 FINDINGS: Brain: Negative for an acute infarct. Arachnoid cyst in the left middle cranial fossa. Sequela of moderate chronic microvascular ischemic change. No hydrocephalus. No hemorrhage. No extra-axial fluid collection. Chronic bilateral cerebellar infarcts. Vascular: Normal flow voids. Skull and upper cervical spine: Normal marrow signal. Sinuses/Orbits: No middle ear or mastoid effusion. Mucosal thickening bilateral ethmoid sinuses. Bilateral lens replacement. Orbits are otherwise unremarkable. Other: None. IMPRESSION: No acute intracranial process. Electronically Signed   By: Lorenza Cambridge M.D.   On: 12/25/2022 15:24   VAS US CAROTID  Result Date: 12/24/2022 Carotid Arterial Duplex Study Patient Name:  Erroll Norsworthy.  Date of Exam:   12/24/2022 Medical Rec #: [id number]           Accession #:    [id number] Date of Birth: 12-Apr-1930          Patient Gender: M Patient Age:   22 years Exam Location:  [facility] Procedure:      VAS US CAROTID Referring Phys: Stephania Fragmin --------------------------------------------------------------------------------  Indications:       Syncope. Risk Factors:      Hypertension, hyperlipidemia, past history of smoking,                    coronary artery disease. Other Factors:     Afib, CKD, CHF, TAVR. Comparison Study:  Previous exam on 12/27/21 RT 60-79% LT 1-39% Performing Technologist: Jody  Hill RVT, RDMS  Examination Guidelines: A complete evaluation includes B-mode imaging, spectral Doppler, color Doppler, and power Doppler as needed of all accessible portions of each vessel. Bilateral testing is considered an integral part of a complete examination. Limited examinations for reoccurring indications may be performed as noted.  Right Carotid Findings: +----------+--------+--------+--------+----------------------+--------+           PSV cm/sEDV cm/sStenosisPlaque Description    Comments +----------+--------+--------+--------+----------------------+--------+ CCA Prox  61      9                                              +----------+--------+--------+--------+----------------------+--------+ CCA Distal70      17                                             +----------+--------+--------+--------+----------------------+--------+ ICA Prox  329     82      80-99%  calcific and irregular         +----------+--------+--------+--------+----------------------+--------+ ICA Mid   74      19                                             +----------+--------+--------+--------+----------------------+--------+ ICA Distal55      16                                             +----------+--------+--------+--------+----------------------+--------+ ECA       240     0       >50%                                   +----------+--------+--------+--------+----------------------+--------+ +----------+--------+-------+----------------+-------------------+  PSV cm/sEDV cmsDescribe        Arm Pressure (mmHG) +----------+--------+-------+----------------+-------------------+ [id number]            Multiphasic, WNL                    +----------+--------+-------+----------------+-------------------+ +---------+--------+--+--------+--+---------+ VertebralPSV cm/s50EDV cm/s14Antegrade +---------+--------+--+--------+--+---------+  Left Carotid Findings:  +----------+--------+--------+--------+---------------------+------------------+           PSV cm/sEDV cm/sStenosisPlaque Description   Comments           +----------+--------+--------+--------+---------------------+------------------+ CCA Prox  84      11                                   intimal thickening +----------+--------+--------+--------+---------------------+------------------+ CCA Distal83      24                                   intimal thickening +----------+--------+--------+--------+---------------------+------------------+ ICA Prox  103     32      1-39%   calcific and                                                              irregular                               +----------+--------+--------+--------+---------------------+------------------+ ICA Distal75      20                                                      +----------+--------+--------+--------+---------------------+------------------+ ECA       121     0               calcific and focal                      +----------+--------+--------+--------+---------------------+------------------+ +----------+--------+--------+----------------+-------------------+           PSV cm/sEDV cm/sDescribe        Arm Pressure (mmHG) +----------+--------+--------+----------------+-------------------+ [id number]             Multiphasic, WNL                    +----------+--------+--------+----------------+-------------------+ +---------+--------+--+--------+--+---------+ VertebralPSV cm/s52EDV cm/s13Antegrade +---------+--------+--+--------+--+---------+   Summary: Right Carotid: Velocities in the right ICA are consistent with a 80-99%                stenosis. The ECA appears >50% stenosed. Left Carotid: Velocities in the left ICA are consistent with a 1-39% stenosis. Vertebrals:  Bilateral vertebral arteries demonstrate antegrade flow. Subclavians: Normal flow hemodynamics were seen  in bilateral subclavian              arteries. *See table(s) above for measurements and observations.  Electronically signed by Heath Lark on 12/24/2022 at 4:50:50 PM.    Final    ECHOCARDIOGRAM COMPLETE  Result Date: 12/24/2022    ECHOCARDIOGRAM REPORT   Patient Name:   Carney Mercadel. Date of Exam:  12/24/2022 Medical Rec #:  [id number]          Height:       68.0 in Accession #:    [id number]         Weight:       187.5 lb Date of Birth:  06-Sep-1929         BSA:          1.988 m Patient Age:    89 years           BP:           157/68 mmHg Patient Gender: M                  HR:           62 bpm. Exam Location:  Inpatient Procedure: 2D Echo, Cardiac Doppler and Color Doppler Indications:    Elevated Troponin  History:        Patient has prior history of Echocardiogram examinations, most                 recent 12/27/2021. CHF, CAD, Signs/Symptoms:Syncope and Dyspnea;                 Risk Factors:Hypertension and Dyslipidemia. CKD, stage 4.                 Aortic Valve: 26 mm Edwards Sapien prosthetic, stented (TAVR)                 valve is present in the aortic position. Procedure Date: 12/12/19.  Sonographer:    Lucendia Herrlich Referring Phys: Emeline General IMPRESSIONS  1. Left ventricular ejection fraction, by estimation, is 55 to 60%. The left ventricle has normal function. The left ventricle has no regional wall motion abnormalities. Left ventricular diastolic parameters are consistent with Grade I diastolic dysfunction (impaired relaxation).  2. Right ventricular systolic function is normal. The right ventricular size is normal.  3. The mitral valve is degenerative. Trivial mitral valve regurgitation. No evidence of mitral stenosis.  4. The aortic valve has been repaired/replaced. Aortic valve regurgitation is not visualized. No aortic stenosis is present. There is a 26 mm Edwards Sapien prosthetic (TAVR) valve present in the aortic position. Procedure Date: 12/12/19. Aortic valve area, by VTI measures 1.85  cm. Aortic valve mean gradient measures 14.0 mmHg. Aortic valve Vmax measures 2.57 m/s. DVI 0.59.  5. The inferior vena cava is normal in size with greater than 50% respiratory variability, suggesting right atrial pressure of 3 mmHg  6. COmpared to study dated 12/27/21, the mean TAVR gradient has increased from 10 to , Vmax has increased from 2.13 to 2.97m/s and DVI has decreased from 0.74 to 0.59. FINDINGS  Left Ventricle: Left ventricular ejection fraction, by estimation, is 55 to 60%. The left ventricle has normal function. The left ventricle has no regional wall motion abnormalities. The left ventricular internal cavity size was normal in size. There is  no left ventricular hypertrophy. Left ventricular diastolic parameters are consistent with Grade I diastolic dysfunction (impaired relaxation). Normal left ventricular filling pressure. Right Ventricle: The right ventricular size is normal. No increase in right ventricular wall thickness. Right ventricular systolic function is normal. Left Atrium: Left atrial size was normal in size. Right Atrium: Right atrial size was normal in size. Pericardium: There is no evidence of pericardial effusion. Mitral Valve: The mitral valve is degenerative in appearance. There is moderate calcification of the anterior mitral valve leaflet(s). Mild mitral annular calcification.  Trivial mitral valve regurgitation. No evidence of mitral valve stenosis. Tricuspid Valve: The tricuspid valve is normal in structure. Tricuspid valve regurgitation is trivial. No evidence of tricuspid stenosis. Aortic Valve: The aortic valve has been repaired/replaced. Aortic valve regurgitation is not visualized. No aortic stenosis is present. Aortic valve mean gradient measures 14.0 mmHg. Aortic valve peak gradient measures 26.5 mmHg. Aortic valve area, by VTI measures 1.85 cm. There is a 26 mm Edwards Sapien prosthetic, stented (TAVR) valve present in the aortic position. Procedure Date: 12/12/19.  Pulmonic Valve: The pulmonic valve was normal in structure. Pulmonic valve regurgitation is not visualized. No evidence of pulmonic stenosis. Aorta: The aortic root is normal in size and structure. Venous: The inferior vena cava is normal in size with greater than 50% respiratory variability, suggesting right atrial pressure of 3 mmHg. IAS/Shunts: No atrial level shunt detected by color flow Doppler.  LEFT VENTRICLE PLAX 2D LVIDd:         4.00 cm   Diastology LVIDs:         2.80 cm   LV e' medial:    6.42 cm/s LV PW:         1.20 cm   LV E/e' medial:  12.4 LV IVS:        0.90 cm   LV e' lateral:   7.62 cm/s LVOT diam:     2.00 cm   LV E/e' lateral: 10.5 LV SV:         109 LV SV Index:   55 LVOT Area:     3.14 cm  RIGHT VENTRICLE RV S prime:     10.40 cm/s TAPSE (M-mode): 2.1 cm LEFT ATRIUM             Index        RIGHT ATRIUM           Index LA diam:        5.00 cm 2.51 cm/m   RA Area:     16.90 cm LA Vol (A2C):   77.8 ml 39.10 ml/m  RA Volume:   38.40 ml  19.31 ml/m LA Vol (A4C):   50.7 ml 25.50 ml/m LA Biplane Vol: 66.3 ml 33.34 ml/m  AORTIC VALVE AV Area (Vmax):    1.79 cm AV Area (Vmean):   1.84 cm AV Area (VTI):     1.85 cm AV Vmax:           257.33 cm/s AV Vmean:          169.500 cm/s AV VTI:            0.589 m AV Peak Grad:      26.5 mmHg AV Mean Grad:      14.0 mmHg LVOT Vmax:         146.80 cm/s LVOT Vmean:        99.420 cm/s LVOT VTI:          0.348 m LVOT/AV VTI ratio: 0.59  AORTA Ao Root diam: 2.80 cm Ao Asc diam:  3.60 cm MITRAL VALVE                TRICUSPID VALVE MV Area (PHT): 5.02 cm     TR Peak grad:   21.3 mmHg MV Decel Time: 151 msec     TR Vmax:        231.00 cm/s MV E velocity: 79.90 cm/s MV A velocity: 142.00 cm/s  SHUNTS MV E/A ratio:  0.56  Systemic VTI:  0.35 m                             Systemic Diam: 2.00 cm Armanda Magic MD Electronically signed by Armanda Magic MD Signature Date/Time: 12/24/2022/1:51:04 PM    Final    CT Chest Wo Contrast  Result Date:  12/23/2022 CLINICAL DATA:  Chest trauma, blunt EXAM: CT CHEST WITHOUT CONTRAST TECHNIQUE: Multidetector CT imaging of the chest was performed following the standard protocol without IV contrast. RADIATION DOSE REDUCTION: This exam was performed according to the departmental dose-optimization program which includes automated exposure control, adjustment of the mA and/or kV according to patient size and/or use of iterative reconstruction technique. COMPARISON:  11/29/2019 CT, 12/23/2022 x-ray FINDINGS: Cardiovascular: Heart size is borderline enlarged. No pericardial effusion. Thoracic aorta is nonaneurysmal. Prior TAVR and coronary artery stent. Atherosclerotic calcifications of the aorta and coronary arteries. Central pulmonary vasculature is nondilated. Mediastinum/Nodes: No enlarged mediastinal or axillary lymph nodes. Thyroid gland, trachea, and esophagus demonstrate no significant findings. Small hiatal hernia. Lungs/Pleura: Lungs are clear. No pleural effusion or pneumothorax. Upper Abdomen: No acute abnormality. Musculoskeletal: Chronic ununited fractures of the posterior right seventh through tenth ribs. Chronic well healed posterior right eleventh rib fracture. No acute rib fracture. Multilevel thoracic spondylosis. No chest wall hematoma. Bilateral gynecomastia. IMPRESSION: 1. No acute traumatic findings within the chest. 2. Multiple chronic right-sided rib fractures. 3. Small hiatal hernia. 4. Aortic and coronary artery atherosclerosis (ICD10-I70.0). Electronically Signed   By: Duanne Guess D.O.   On: 12/23/2022 15:46   CT Head Wo Contrast  Result Date: 12/23/2022 CLINICAL DATA:  Level 2 trauma EXAM: CT HEAD WITHOUT CONTRAST CT MAXILLOFACIAL WITHOUT CONTRAST CT CERVICAL SPINE WITHOUT CONTRAST TECHNIQUE: Multidetector CT imaging of the head, cervical spine, and maxillofacial structures were performed using the standard protocol without intravenous contrast. Multiplanar CT image reconstructions of  the cervical spine and maxillofacial structures were also generated. RADIATION DOSE REDUCTION: This exam was performed according to the departmental dose-optimization program which includes automated exposure control, adjustment of the mA and/or kV according to patient size and/or use of iterative reconstruction technique. COMPARISON:  None Available. FINDINGS: CT HEAD FINDINGS Brain: No evidence of acute infarction, hemorrhage, extra-axial collection, ventriculomegaly, or mass effect. Generalized cerebral atrophy. Periventricular white matter low attenuation likely secondary to microangiopathy. Left middle cranial fossa arachnoid cyst. Vascular: Cerebrovascular atherosclerotic calcifications are noted. No hyperdense vessels. Skull: Negative for fracture or focal lesion. Sinuses/Orbits: Visualized portions of the orbits are unremarkable. Visualized portions of the paranasal sinuses are unremarkable. Visualized portions of the mastoid air cells are unremarkable. Other: None. CT MAXILLOFACIAL FINDINGS Osseous: No fracture or mandibular dislocation. No destructive process. Orbits: Negative. No traumatic or inflammatory finding. Sinuses: Clear. Soft tissues: Right infraorbital soft tissue swelling. CT CERVICAL SPINE FINDINGS Alignment: Normal. Skull base and vertebrae: No acute fracture. No primary bone lesion or focal pathologic process. Soft tissues and spinal canal: No prevertebral fluid or swelling. No visible canal hematoma. Disc levels: Degenerative disease with disc height loss at C3-4, C4-5, C5-6, C6-7 and C7-T1. Broad-based disc osteophyte complex and foraminal narrowing at C3-4. Broad-based disc osteophyte complex at C5-6, right uncovertebral degenerative changes and mild right foraminal narrowing. Upper chest: Lung apices are clear. Other: No fluid collection or hematoma. IMPRESSION: 1. No acute intracranial pathology. 2. No acute osseous injury of the facial bones. 3. No acute osseous injury of the cervical  spine. 4. Cervical spine spondylosis as described  above. Electronically Signed   By: Elige Ko M.D.   On: 12/23/2022 13:31   CT Cervical Spine Wo Contrast  Result Date: 12/23/2022 CLINICAL DATA:  Level 2 trauma EXAM: CT HEAD WITHOUT CONTRAST CT MAXILLOFACIAL WITHOUT CONTRAST CT CERVICAL SPINE WITHOUT CONTRAST TECHNIQUE: Multidetector CT imaging of the head, cervical spine, and maxillofacial structures were performed using the standard protocol without intravenous contrast. Multiplanar CT image reconstructions of the cervical spine and maxillofacial structures were also generated. RADIATION DOSE REDUCTION: This exam was performed according to the departmental dose-optimization program which includes automated exposure control, adjustment of the mA and/or kV according to patient size and/or use of iterative reconstruction technique. COMPARISON:  None Available. FINDINGS: CT HEAD FINDINGS Brain: No evidence of acute infarction, hemorrhage, extra-axial collection, ventriculomegaly, or mass effect. Generalized cerebral atrophy. Periventricular white matter low attenuation likely secondary to microangiopathy. Left middle cranial fossa arachnoid cyst. Vascular: Cerebrovascular atherosclerotic calcifications are noted. No hyperdense vessels. Skull: Negative for fracture or focal lesion. Sinuses/Orbits: Visualized portions of the orbits are unremarkable. Visualized portions of the paranasal sinuses are unremarkable. Visualized portions of the mastoid air cells are unremarkable. Other: None. CT MAXILLOFACIAL FINDINGS Osseous: No fracture or mandibular dislocation. No destructive process. Orbits: Negative. No traumatic or inflammatory finding. Sinuses: Clear. Soft tissues: Right infraorbital soft tissue swelling. CT CERVICAL SPINE FINDINGS Alignment: Normal. Skull base and vertebrae: No acute fracture. No primary bone lesion or focal pathologic process. Soft tissues and spinal canal: No prevertebral fluid or swelling.  No visible canal hematoma. Disc levels: Degenerative disease with disc height loss at C3-4, C4-5, C5-6, C6-7 and C7-T1. Broad-based disc osteophyte complex and foraminal narrowing at C3-4. Broad-based disc osteophyte complex at C5-6, right uncovertebral degenerative changes and mild right foraminal narrowing. Upper chest: Lung apices are clear. Other: No fluid collection or hematoma. IMPRESSION: 1. No acute intracranial pathology. 2. No acute osseous injury of the facial bones. 3. No acute osseous injury of the cervical spine. 4. Cervical spine spondylosis as described above. Electronically Signed   By: Elige Ko M.D.   On: 12/23/2022 13:31   CT Maxillofacial Wo Contrast  Result Date: 12/23/2022 CLINICAL DATA:  Level 2 trauma EXAM: CT HEAD WITHOUT CONTRAST CT MAXILLOFACIAL WITHOUT CONTRAST CT CERVICAL SPINE WITHOUT CONTRAST TECHNIQUE: Multidetector CT imaging of the head, cervical spine, and maxillofacial structures were performed using the standard protocol without intravenous contrast. Multiplanar CT image reconstructions of the cervical spine and maxillofacial structures were also generated. RADIATION DOSE REDUCTION: This exam was performed according to the departmental dose-optimization program which includes automated exposure control, adjustment of the mA and/or kV according to patient size and/or use of iterative reconstruction technique. COMPARISON:  None Available. FINDINGS: CT HEAD FINDINGS Brain: No evidence of acute infarction, hemorrhage, extra-axial collection, ventriculomegaly, or mass effect. Generalized cerebral atrophy. Periventricular white matter low attenuation likely secondary to microangiopathy. Left middle cranial fossa arachnoid cyst. Vascular: Cerebrovascular atherosclerotic calcifications are noted. No hyperdense vessels. Skull: Negative for fracture or focal lesion. Sinuses/Orbits: Visualized portions of the orbits are unremarkable. Visualized portions of the paranasal sinuses are  unremarkable. Visualized portions of the mastoid air cells are unremarkable. Other: None. CT MAXILLOFACIAL FINDINGS Osseous: No fracture or mandibular dislocation. No destructive process. Orbits: Negative. No traumatic or inflammatory finding. Sinuses: Clear. Soft tissues: Right infraorbital soft tissue swelling. CT CERVICAL SPINE FINDINGS Alignment: Normal. Skull base and vertebrae: No acute fracture. No primary bone lesion or focal pathologic process. Soft tissues and spinal canal: No prevertebral fluid or swelling. No visible  canal hematoma. Disc levels: Degenerative disease with disc height loss at C3-4, C4-5, C5-6, C6-7 and C7-T1. Broad-based disc osteophyte complex and foraminal narrowing at C3-4. Broad-based disc osteophyte complex at C5-6, right uncovertebral degenerative changes and mild right foraminal narrowing. Upper chest: Lung apices are clear. Other: No fluid collection or hematoma. IMPRESSION: 1. No acute intracranial pathology. 2. No acute osseous injury of the facial bones. 3. No acute osseous injury of the cervical spine. 4. Cervical spine spondylosis as described above. Electronically Signed   By: Elige Ko M.D.   On: 12/23/2022 13:31   DG Chest Portable 1 View  Result Date: 12/23/2022 CLINICAL DATA:  Chest pain EXAM: PORTABLE CHEST 1 VIEW COMPARISON:  06/14/2022 FINDINGS: No focal consolidation. No pleural effusion or pneumothorax. Heart and mediastinal contours are unremarkable. Prior TAVR. No acute osseous abnormality. IMPRESSION: No active disease. Electronically Signed   By: Elige Ko M.D.   On: 12/23/2022 12:56   DG Pelvis Portable  Result Date: 12/23/2022 CLINICAL DATA:  Chest pain, trauma, fall. EXAM: PORTABLE PELVIS 1-2 VIEWS COMPARISON:  04/28/2009. FINDINGS: Hip joint space is uniform bilaterally. No acute osseous abnormality. Brachytherapy seeds in the prostate. IMPRESSION: No acute findings. Electronically Signed   By: Leanna Battles M.D.   On: 12/23/2022 12:55      The results of significant diagnostics from this hospitalization (including imaging, microbiology, ancillary and laboratory) are listed below for reference.     Microbiology: No results found for this or any previous visit (from the past 240 hour(s)).   Labs: BNP (last 3 results) Recent Labs    06/14/22 0950 12/25/22 0129  BNP 255.6* 208.6*   Basic Metabolic Panel: Recent Labs  Lab 12/23/22 1215 12/23/22 1217 12/24/22 0047 12/25/22 0129  NA 139 140 137 138  K 4.6 4.7 4.3 3.9  CL 108 110 107 109  CO2 20*  --  20* 22  GLUCOSE 120* 113* 118* 104*  BUN 52* 45* 49* 47*  CREATININE 2.71* 3.00* 2.59* 2.66*  CALCIUM 8.5*  --  8.3* 7.8*  MG  --   --   --  2.2   Liver Function Tests: Recent Labs  Lab 12/23/22 1215  AST 22  ALT 13  ALKPHOS 76  BILITOT 0.7  PROT 6.6  ALBUMIN 3.3*   No results for input(s): "LIPASE", "AMYLASE" in the last 168 hours. No results for input(s): "AMMONIA" in the last 168 hours. CBC: Recent Labs  Lab 12/23/22 1215 12/23/22 1217 12/25/22 0129  WBC 12.3*  --  10.2  NEUTROABS 9.4*  --   --   HGB 11.2* 11.9* 9.9*  HCT 35.2* 35.0* 30.1*  MCV 104.8*  --  102.0*  PLT 136*  --  137*   Cardiac Enzymes: Recent Labs  Lab 12/23/22 1215  CKTOTAL 50   BNP: Invalid input(s): "POCBNP" CBG: Recent Labs  Lab 12/23/22 1221  GLUCAP 95   D-Dimer No results for input(s): "DDIMER" in the last 72 hours. Hgb A1c No results for input(s): "HGBA1C" in the last 72 hours. Lipid Profile No results for input(s): "CHOL", "HDL", "LDLCALC", "TRIG", "CHOLHDL", "LDLDIRECT" in the last 72 hours. Thyroid function studies No results for input(s): "TSH", "T4TOTAL", "T3FREE", "THYROIDAB" in the last 72 hours.  Invalid input(s): "FREET3" Anemia work up No results for input(s): "VITAMINB12", "FOLATE", "FERRITIN", "TIBC", "IRON", "RETICCTPCT" in the last 72 hours. Urinalysis    Component Value Date/Time   COLORURINE YELLOW 12/23/2022 1253   APPEARANCEUR  CLEAR 12/23/2022 1253   APPEARANCEUR Clear 12/17/2020  1442   LABSPEC 1.009 12/23/2022 1253   PHURINE 5.0 12/23/2022 1253   GLUCOSEU NEGATIVE 12/23/2022 1253   GLUCOSEU NEGATIVE 05/21/2009 1057   HGBUR NEGATIVE 12/23/2022 1253   BILIRUBINUR NEGATIVE 12/23/2022 1253   BILIRUBINUR neg 03/26/2021 1218   BILIRUBINUR Negative 12/17/2020 1442   KETONESUR NEGATIVE 12/23/2022 1253   PROTEINUR NEGATIVE 12/23/2022 1253   UROBILINOGEN 0.2 03/26/2021 1218   UROBILINOGEN 0.2 08/20/2014 1224   NITRITE NEGATIVE 12/23/2022 1253   LEUKOCYTESUR NEGATIVE 12/23/2022 1253   Sepsis Labs Recent Labs  Lab 12/23/22 1215 12/25/22 0129  WBC 12.3* 10.2   Microbiology No results found for this or any previous visit (from the past 240 hour(s)).   Time coordinating discharge:  I have spent 35 minutes face to face with the patient and on the ward discussing the patients care, assessment, plan and disposition with other care givers. >50% of the time was devoted counseling the patient about the risks and benefits of treatment/Discharge disposition and coordinating care.   SIGNED:   Dimple Nanas, MD  [facility] 12/25/2022, 3:48 PM   If 7PM-7AM, please contact night-coverage

## 2022-12-25 NOTE — Progress Notes (Unsigned)
Enrolled patient for a 14 day Zio AT monitor to be mailed to patients home   Dr Harding to read 

## 2022-12-25 NOTE — Consult Note (Signed)
   [facility] CM Inpatient Consult   12/25/2022  Keaton Randles 1929/12/29 [id number]  [facility] [[facility]]  Accountable Care Organization [ACO] Patient:  Medicare ACO REACH  Primary Care Provider:  Shelva Majestic, MD, [place] [facility]   Patient screened for hospitalization with noted high risk score for unplanned readmission risk to assess for potential [facility]  [[facility]] Care Management service needs for post hospital transition for care coordination.  Review of patient's electronic medical record reveals patient has been active with [facility] for Care Coordination and was previously in CCM Met with family at bedside, as patient was just finishing up AM care with staff.  Patient expresses desire to go home.  A 24 hour nurse advise line magnet and an appointment reminder card given, regarding potential follow up with [facility] pharmacist for medication review and changes,as well.  Plan:  Continue to follow progress and disposition to assess for post hospital community care coordination/management needs.  Referral request for community care coordination: To anticipate [facility] TOC or TOC follow up for post hospital.  Of note, [facility] Care Management/Population Health does not replace or interfere with any arrangements made by the Inpatient Transition of Care team.  For questions contact:   Charlesetta Shanks, RN BSN CCM [facility]  [phone] business mobile phone Toll free office [phone]  *Concierge Line  [phone] Fax number: [phone] [email or web address]

## 2022-12-25 NOTE — Progress Notes (Signed)
Ordered a 2 week live monitor for evaluation of syncope. Monitor should be read by Dr. Herbie Baltimore.   Jonita Albee, PA-C 12/25/2022 3:53 PM

## 2022-12-25 NOTE — Progress Notes (Signed)
PROGRESS NOTE    Mark Pao.  WUJ:[id number] DOB: 1930/04/08 DOA: 12/23/2022 PCP: Shelva Majestic, MD   Brief Narrative:   89 year old with history of essential hypertension, CAD status post stenting, HTN, CKD stage IIIb, paroxysmal atrial fibrillation on Eliquis, aortic stenosis status post TAVR, migraine comes to the hospital for recurrent falls.  Initially slightly orthostatic requiring IV fluids.  Cardiology team was consulted as well.  Echocardiogram was overall unremarkable but carotid duplex showed right-sided carotid stenosis of 80-99%.  I discussed this with Dr. Juanetta Gosling from [facility].  He states indication for intervention would be CVA but does not result in syncope.  MRI brain ordered.  Assessment & Plan:  Principal Problem:   Syncope Active Problems:   Essential hypertension   PAF (paroxysmal atrial fibrillation) (HCC)   Chronic kidney disease (CKD), stage IV (severe) (HCC)   Acute on chronic diastolic heart failure (HCC)   Gait disturbance, post-stroke   Demand ischemia   Carotid stenosis, right    Recurrent syncope - Could be mild factorial in nature but there is always a suspicion for autonomic dysfunction.  Echocardiogram shows EF of 55 to 60%, normal functioning prosthetic valve.  Cardizem reduced 220 mg daily.  Cardiology is recommending 2 weeks of Zio patch which will be arranged by their service.  Carotid duplex shows right-sided carotid stenosis 80-99%.  I discussed case with Dr. Juanetta Gosling who advised this does not result in syncope but can be a risk for MCA CVA.  MRI brain ordered to evaluate for this to see if it further needs to be intervened.  Family agreeable to this.  Elevated troponin - Doubt this is ACS.  Likely demand ischemia.  Seen back geology, echo as above  Acute diastolic CHF - Overall euvolemic now, transition to p.o. Lasix  Paroxysmal atrial fibrillation with history of CVA - Currently on Cardizem which was reduced.  Continue Eliquis.  CAD  status post PCI in 2016 and 2017 - On Eliquis and statin  Severe aortic stenosis status post TAVR 2021 - Echo stay for prosthetic valve  Chronic kidney disease stage IV - Creatinine around baseline of 2.7.       DVT prophylaxis: Eliquis Code Status: Full code Family Communication: Multiple family members at bedside Continue hospital stay for syncope evaluation.  Getting right brain     Diet Orders (From admission, onward)     Start     Ordered   12/23/22 1615  Diet Heart Room service appropriate? Yes; Fluid consistency: Thin; Fluid restriction: 1800 mL Fluid  Diet effective now       Question Answer Comment  Room service appropriate? Yes   Fluid consistency: Thin   Fluid restriction: 1800 mL Fluid      12/23/22 1615            Subjective:  Patient for is okay no complaints.  Family members at bedside.  Agreeable to obtain MRI brain Examination:  Constitutional: Not in acute distress Respiratory: Clear to auscultation bilaterally Cardiovascular: Normal sinus rhythm, no rubs Abdomen: Nontender nondistended good bowel sounds Musculoskeletal: No edema noted Skin: Facial laceration noted Neurologic: CN 2-12 grossly intact.  And nonfocal Psychiatric: Normal judgment and insight. Alert and oriented x 3. Normal mood.  Objective: Vitals:   12/25/22 0056 12/25/22 0500 12/25/22 0820 12/25/22 1133  BP: 133/61 (!) 164/79 (!) 159/79 (!) 149/71  Pulse: 79  79 85  Resp: 18 11 12 20   Temp: 98.5 F (36.9 C) 97.8 F (36.6 C) 98.2  F (36.8 C)   TempSrc: Oral Oral Oral Oral  SpO2: 98% 97% 99% 98%  Weight:  85 kg    Height:        Intake/Output Summary (Last 24 hours) at 12/25/2022 1503 Last data filed at 12/25/2022 0900 Gross per 24 hour  Intake 723 ml  Output 1150 ml  Net -427 ml   Filed Weights   12/23/22 1802 12/24/22 0332 12/25/22 0500  Weight: 85.8 kg 85 kg 85 kg    Scheduled Meds:  apixaban  2.5 mg Oral BID   bacitracin   Topical BID   diltiazem  120  mg Oral Daily   ezetimibe  10 mg Oral Daily   FLUoxetine  10 mg Oral Daily   furosemide  40 mg Oral Daily   hydrALAZINE  10 mg Oral Q8H   isosorbide mononitrate  60 mg Oral Daily   lidocaine  1 patch Transdermal Q24H   rosuvastatin  10 mg Oral Daily   sodium chloride flush  3 mL Intravenous Q12H   topiramate  100 mg Oral BID   Continuous Infusions:  sodium chloride      Nutritional status     Body mass index is 28.51 kg/m.  Data Reviewed:   CBC: Recent Labs  Lab 12/23/22 1215 12/23/22 1217 12/25/22 0129  WBC 12.3*  --  10.2  NEUTROABS 9.4*  --   --   HGB 11.2* 11.9* 9.9*  HCT 35.2* 35.0* 30.1*  MCV 104.8*  --  102.0*  PLT 136*  --  137*   Basic Metabolic Panel: Recent Labs  Lab 12/23/22 1215 12/23/22 1217 12/24/22 0047 12/25/22 0129  NA 139 140 137 138  K 4.6 4.7 4.3 3.9  CL 108 110 107 109  CO2 20*  --  20* 22  GLUCOSE 120* 113* 118* 104*  BUN 52* 45* 49* 47*  CREATININE 2.71* 3.00* 2.59* 2.66*  CALCIUM 8.5*  --  8.3* 7.8*  MG  --   --   --  2.2   GFR: Estimated Creatinine Clearance: 19.6 mL/min (A) (by C-G formula based on SCr of 2.66 mg/dL (H)). Liver Function Tests: Recent Labs  Lab 12/23/22 1215  AST 22  ALT 13  ALKPHOS 76  BILITOT 0.7  PROT 6.6  ALBUMIN 3.3*   No results for input(s): "LIPASE", "AMYLASE" in the last 168 hours. No results for input(s): "AMMONIA" in the last 168 hours. Coagulation Profile: Recent Labs  Lab 12/23/22 1215  INR 1.2   Cardiac Enzymes: Recent Labs  Lab 12/23/22 1215  CKTOTAL 50   BNP (last 3 results) No results for input(s): "PROBNP" in the last 8760 hours. HbA1C: No results for input(s): "HGBA1C" in the last 72 hours. CBG: Recent Labs  Lab 12/23/22 1221  GLUCAP 95   Lipid Profile: No results for input(s): "CHOL", "HDL", "LDLCALC", "TRIG", "CHOLHDL", "LDLDIRECT" in the last 72 hours. Thyroid Function Tests: No results for input(s): "TSH", "T4TOTAL", "FREET4", "T3FREE", "THYROIDAB" in the last  72 hours. Anemia Panel: No results for input(s): "VITAMINB12", "FOLATE", "FERRITIN", "TIBC", "IRON", "RETICCTPCT" in the last 72 hours. Sepsis Labs: No results for input(s): "PROCALCITON", "LATICACIDVEN" in the last 168 hours.  No results found for this or any previous visit (from the past 240 hour(s)).       Radiology Studies: VAS US CAROTID  Result Date: 12/24/2022 Carotid Arterial Duplex Study Patient Name:  Mark Caperton.  Date of Exam:   12/24/2022 Medical Rec #: [id number]  Accession #:    [id number] Date of Birth: Apr 27, 1930          Patient Gender: M Patient Age:   31 years Exam Location:  [facility] Procedure:      VAS US CAROTID Referring Phys: Stephania Fragmin --------------------------------------------------------------------------------  Indications:       Syncope. Risk Factors:      Hypertension, hyperlipidemia, past history of smoking,                    coronary artery disease. Other Factors:     Afib, CKD, CHF, TAVR. Comparison Study:  Previous exam on 12/27/21 RT 60-79% LT 1-39% Performing Technologist: Jody Hill RVT, RDMS  Examination Guidelines: A complete evaluation includes B-mode imaging, spectral Doppler, color Doppler, and power Doppler as needed of all accessible portions of each vessel. Bilateral testing is considered an integral part of a complete examination. Limited examinations for reoccurring indications may be performed as noted.  Right Carotid Findings: +----------+--------+--------+--------+----------------------+--------+           PSV cm/sEDV cm/sStenosisPlaque Description    Comments +----------+--------+--------+--------+----------------------+--------+ CCA Prox  61      9                                              +----------+--------+--------+--------+----------------------+--------+ CCA Distal70      17                                             +----------+--------+--------+--------+----------------------+--------+ ICA  Prox  329     82      80-99%  calcific and irregular         +----------+--------+--------+--------+----------------------+--------+ ICA Mid   74      19                                             +----------+--------+--------+--------+----------------------+--------+ ICA Distal55      16                                             +----------+--------+--------+--------+----------------------+--------+ ECA       240     0       >50%                                   +----------+--------+--------+--------+----------------------+--------+ +----------+--------+-------+----------------+-------------------+           PSV cm/sEDV cmsDescribe        Arm Pressure (mmHG) +----------+--------+-------+----------------+-------------------+ [id number]            Multiphasic, WNL                    +----------+--------+-------+----------------+-------------------+ +---------+--------+--+--------+--+---------+ VertebralPSV cm/s50EDV cm/s14Antegrade +---------+--------+--+--------+--+---------+  Left Carotid Findings: +----------+--------+--------+--------+---------------------+------------------+           PSV cm/sEDV cm/sStenosisPlaque Description   Comments           +----------+--------+--------+--------+---------------------+------------------+ CCA Prox  84      11  intimal thickening +----------+--------+--------+--------+---------------------+------------------+ CCA Distal83      24                                   intimal thickening +----------+--------+--------+--------+---------------------+------------------+ ICA Prox  103     32      1-39%   calcific and                                                              irregular                               +----------+--------+--------+--------+---------------------+------------------+ ICA Distal75      20                                                       +----------+--------+--------+--------+---------------------+------------------+ ECA       121     0               calcific and focal                      +----------+--------+--------+--------+---------------------+------------------+ +----------+--------+--------+----------------+-------------------+           PSV cm/sEDV cm/sDescribe        Arm Pressure (mmHG) +----------+--------+--------+----------------+-------------------+ [id number]             Multiphasic, WNL                    +----------+--------+--------+----------------+-------------------+ +---------+--------+--+--------+--+---------+ VertebralPSV cm/s52EDV cm/s13Antegrade +---------+--------+--+--------+--+---------+   Summary: Right Carotid: Velocities in the right ICA are consistent with a 80-99%                stenosis. The ECA appears >50% stenosed. Left Carotid: Velocities in the left ICA are consistent with a 1-39% stenosis. Vertebrals:  Bilateral vertebral arteries demonstrate antegrade flow. Subclavians: Normal flow hemodynamics were seen in bilateral subclavian              arteries. *See table(s) above for measurements and observations.  Electronically signed by Heath Lark on 12/24/2022 at 4:50:50 PM.    Final    ECHOCARDIOGRAM COMPLETE  Result Date: 12/24/2022    ECHOCARDIOGRAM REPORT   Patient Name:   Mark Desimone. Date of Exam: 12/24/2022 Medical Rec #:  [id number]          Height:       68.0 in Accession #:    [id number]         Weight:       187.5 lb Date of Birth:  03-18-1930         BSA:          1.988 m Patient Age:    89 years           BP:           157/68 mmHg Patient Gender: M                  HR:  62 bpm. Exam Location:  Inpatient Procedure: 2D Echo, Cardiac Doppler and Color Doppler Indications:    Elevated Troponin  History:        Patient has prior history of Echocardiogram examinations, most                 recent 12/27/2021. CHF, CAD, Signs/Symptoms:Syncope and  Dyspnea;                 Risk Factors:Hypertension and Dyslipidemia. CKD, stage 4.                 Aortic Valve: 26 mm Edwards Sapien prosthetic, stented (TAVR)                 valve is present in the aortic position. Procedure Date: 12/12/19.  Sonographer:    Lucendia Herrlich Referring Phys: Emeline General IMPRESSIONS  1. Left ventricular ejection fraction, by estimation, is 55 to 60%. The left ventricle has normal function. The left ventricle has no regional wall motion abnormalities. Left ventricular diastolic parameters are consistent with Grade I diastolic dysfunction (impaired relaxation).  2. Right ventricular systolic function is normal. The right ventricular size is normal.  3. The mitral valve is degenerative. Trivial mitral valve regurgitation. No evidence of mitral stenosis.  4. The aortic valve has been repaired/replaced. Aortic valve regurgitation is not visualized. No aortic stenosis is present. There is a 26 mm Edwards Sapien prosthetic (TAVR) valve present in the aortic position. Procedure Date: 12/12/19. Aortic valve area, by VTI measures 1.85 cm. Aortic valve mean gradient measures 14.0 mmHg. Aortic valve Vmax measures 2.57 m/s. DVI 0.59.  5. The inferior vena cava is normal in size with greater than 50% respiratory variability, suggesting right atrial pressure of 3 mmHg  6. COmpared to study dated 12/27/21, the mean TAVR gradient has increased from 10 to , Vmax has increased from 2.13 to 2.65m/s and DVI has decreased from 0.74 to 0.59. FINDINGS  Left Ventricle: Left ventricular ejection fraction, by estimation, is 55 to 60%. The left ventricle has normal function. The left ventricle has no regional wall motion abnormalities. The left ventricular internal cavity size was normal in size. There is  no left ventricular hypertrophy. Left ventricular diastolic parameters are consistent with Grade I diastolic dysfunction (impaired relaxation). Normal left ventricular filling pressure. Right Ventricle:  The right ventricular size is normal. No increase in right ventricular wall thickness. Right ventricular systolic function is normal. Left Atrium: Left atrial size was normal in size. Right Atrium: Right atrial size was normal in size. Pericardium: There is no evidence of pericardial effusion. Mitral Valve: The mitral valve is degenerative in appearance. There is moderate calcification of the anterior mitral valve leaflet(s). Mild mitral annular calcification. Trivial mitral valve regurgitation. No evidence of mitral valve stenosis. Tricuspid Valve: The tricuspid valve is normal in structure. Tricuspid valve regurgitation is trivial. No evidence of tricuspid stenosis. Aortic Valve: The aortic valve has been repaired/replaced. Aortic valve regurgitation is not visualized. No aortic stenosis is present. Aortic valve mean gradient measures 14.0 mmHg. Aortic valve peak gradient measures 26.5 mmHg. Aortic valve area, by VTI measures 1.85 cm. There is a 26 mm Edwards Sapien prosthetic, stented (TAVR) valve present in the aortic position. Procedure Date: 12/12/19. Pulmonic Valve: The pulmonic valve was normal in structure. Pulmonic valve regurgitation is not visualized. No evidence of pulmonic stenosis. Aorta: The aortic root is normal in size and structure. Venous: The inferior vena cava is normal in size with greater than 50%  respiratory variability, suggesting right atrial pressure of 3 mmHg. IAS/Shunts: No atrial level shunt detected by color flow Doppler.  LEFT VENTRICLE PLAX 2D LVIDd:         4.00 cm   Diastology LVIDs:         2.80 cm   LV e' medial:    6.42 cm/s LV PW:         1.20 cm   LV E/e' medial:  12.4 LV IVS:        0.90 cm   LV e' lateral:   7.62 cm/s LVOT diam:     2.00 cm   LV E/e' lateral: 10.5 LV SV:         109 LV SV Index:   55 LVOT Area:     3.14 cm  RIGHT VENTRICLE RV S prime:     10.40 cm/s TAPSE (M-mode): 2.1 cm LEFT ATRIUM             Index        RIGHT ATRIUM           Index LA diam:        5.00  cm 2.51 cm/m   RA Area:     16.90 cm LA Vol (A2C):   77.8 ml 39.10 ml/m  RA Volume:   38.40 ml  19.31 ml/m LA Vol (A4C):   50.7 ml 25.50 ml/m LA Biplane Vol: 66.3 ml 33.34 ml/m  AORTIC VALVE AV Area (Vmax):    1.79 cm AV Area (Vmean):   1.84 cm AV Area (VTI):     1.85 cm AV Vmax:           257.33 cm/s AV Vmean:          169.500 cm/s AV VTI:            0.589 m AV Peak Grad:      26.5 mmHg AV Mean Grad:      14.0 mmHg LVOT Vmax:         146.80 cm/s LVOT Vmean:        99.420 cm/s LVOT VTI:          0.348 m LVOT/AV VTI ratio: 0.59  AORTA Ao Root diam: 2.80 cm Ao Asc diam:  3.60 cm MITRAL VALVE                TRICUSPID VALVE MV Area (PHT): 5.02 cm     TR Peak grad:   21.3 mmHg MV Decel Time: 151 msec     TR Vmax:        231.00 cm/s MV E velocity: 79.90 cm/s MV A velocity: 142.00 cm/s  SHUNTS MV E/A ratio:  0.56         Systemic VTI:  0.35 m                             Systemic Diam: 2.00 cm Armanda Magic MD Electronically signed by Armanda Magic MD Signature Date/Time: 12/24/2022/1:51:04 PM    Final    CT Chest Wo Contrast  Result Date: 12/23/2022 CLINICAL DATA:  Chest trauma, blunt EXAM: CT CHEST WITHOUT CONTRAST TECHNIQUE: Multidetector CT imaging of the chest was performed following the standard protocol without IV contrast. RADIATION DOSE REDUCTION: This exam was performed according to the departmental dose-optimization program which includes automated exposure control, adjustment of the mA and/or kV according to patient size and/or use of iterative reconstruction technique. COMPARISON:  11/29/2019 CT, 12/23/2022  x-ray FINDINGS: Cardiovascular: Heart size is borderline enlarged. No pericardial effusion. Thoracic aorta is nonaneurysmal. Prior TAVR and coronary artery stent. Atherosclerotic calcifications of the aorta and coronary arteries. Central pulmonary vasculature is nondilated. Mediastinum/Nodes: No enlarged mediastinal or axillary lymph nodes. Thyroid gland, trachea, and esophagus demonstrate no  significant findings. Small hiatal hernia. Lungs/Pleura: Lungs are clear. No pleural effusion or pneumothorax. Upper Abdomen: No acute abnormality. Musculoskeletal: Chronic ununited fractures of the posterior right seventh through tenth ribs. Chronic well healed posterior right eleventh rib fracture. No acute rib fracture. Multilevel thoracic spondylosis. No chest wall hematoma. Bilateral gynecomastia. IMPRESSION: 1. No acute traumatic findings within the chest. 2. Multiple chronic right-sided rib fractures. 3. Small hiatal hernia. 4. Aortic and coronary artery atherosclerosis (ICD10-I70.0). Electronically Signed   By: Duanne Guess D.O.   On: 12/23/2022 15:46           LOS: 1 day   Time spent= 35 mins    Letcher Schweikert Joline Maxcy, MD [facility]  If 7PM-7AM, please contact night-coverage  12/25/2022, 3:03 PM

## 2022-12-25 NOTE — Progress Notes (Signed)
Rounding Note    Patient Name: Mark Jackson. Date of Encounter: 12/25/2022  [facility] Cardiologist: Mark Lemma, MD   Subjective   Net negative 667cc yesterday, negative 1.5L on admission. Cr stable(3.0 >2.59>2.66).  Denies any further lightheadedness or syncope.  No dyspnea.  Continues to have right sided chest pain since his fall, tender to palpation  Inpatient Medications    Scheduled Meds:  apixaban  2.5 mg Oral BID   bacitracin   Topical BID   diltiazem  120 mg Oral Daily   ezetimibe  10 mg Oral Daily   FLUoxetine  10 mg Oral Daily   furosemide  40 mg Intravenous Daily   hydrALAZINE  10 mg Oral Q8H   isosorbide mononitrate  60 mg Oral Daily   lidocaine  1 patch Transdermal Q24H   rosuvastatin  10 mg Oral Daily   sodium chloride flush  3 mL Intravenous Q12H   topiramate  100 mg Oral BID   Continuous Infusions:  sodium chloride     PRN Meds: sodium chloride, acetaminophen, guaiFENesin, hydrALAZINE, ipratropium-albuterol, metoprolol tartrate, nitroGLYCERIN, ondansetron (ZOFRAN) IV, oxyCODONE, senna-docusate, sodium chloride flush, traMADol   Vital Signs    Vitals:   12/24/22 1441 12/25/22 0056 12/25/22 0500 12/25/22 0820  BP: (!) 162/71 133/61 (!) 164/79 (!) 159/79  Pulse:  79  79  Resp:  18 11 12   Temp:  98.5 F (36.9 C) 97.8 F (36.6 C) 98.2 F (36.8 C)  TempSrc:  Oral Oral Oral  SpO2:  98% 97% 99%  Weight:   85 kg   Height:        Intake/Output Summary (Last 24 hours) at 12/25/2022 0917 Last data filed at 12/25/2022 0500 Gross per 24 hour  Intake 483 ml  Output 1150 ml  Net -667 ml       12/25/2022    5:00 AM 12/24/2022    3:32 AM 12/23/2022    6:02 PM  Last 3 Weights  Weight (lbs) 187 lb 8 oz 187 lb 8 oz 189 lb 2.5 oz  Weight (kg) 85.049 kg 85.049 kg 85.8 kg      Telemetry    NSR - Personally Reviewed  ECG    No new ECG - Personally Reviewed  Physical Exam   GEN: No acute distress.   Neck:No JVD Cardiac: RRR, 2/6  systolic murmur Respiratory: Clear to auscultation bilaterally. GI: Soft, nontende MS: trace edema Neuro:  Nonfocal  Psych: Normal affect   Labs    High Sensitivity Troponin:   Recent Labs  Lab 12/23/22 1215 12/23/22 1450 12/24/22 0536  TROPONINIHS 214* 175* 147*      Chemistry Recent Labs  Lab 12/23/22 1215 12/23/22 1217 12/24/22 0047 12/25/22 0129  NA 139 140 137 138  K 4.6 4.7 4.3 3.9  CL 108 110 107 109  CO2 20*  --  20* 22  GLUCOSE 120* 113* 118* 104*  BUN 52* 45* 49* 47*  CREATININE 2.71* 3.00* 2.59* 2.66*  CALCIUM 8.5*  --  8.3* 7.8*  MG  --   --   --  2.2  PROT 6.6  --   --   --   ALBUMIN 3.3*  --   --   --   AST 22  --   --   --   ALT 13  --   --   --   ALKPHOS 76  --   --   --   BILITOT 0.7  --   --   --  GFRNONAA 22*  --  23* 22*  ANIONGAP 11  --  10 7     Lipids No results for input(s): "CHOL", "TRIG", "HDL", "LABVLDL", "LDLCALC", "CHOLHDL" in the last 168 hours.  Hematology Recent Labs  Lab 12/23/22 1215 12/23/22 1217 12/25/22 0129  WBC 12.3*  --  10.2  RBC 3.36*  --  2.95*  HGB 11.2* 11.9* 9.9*  HCT 35.2* 35.0* 30.1*  MCV 104.8*  --  102.0*  MCH 33.3  --  33.6  MCHC 31.8  --  32.9  RDW 13.2  --  13.2  PLT 136*  --  137*    Thyroid No results for input(s): "TSH", "FREET4" in the last 168 hours.  BNP Recent Labs  Lab 12/25/22 0129  BNP 208.6*    DDimer No results for input(s): "DDIMER" in the last 168 hours.   Radiology    VAS US CAROTID  Result Date: 12/24/2022 Carotid Arterial Duplex Study Patient Name:  Mark Jackson.  Date of Exam:   12/24/2022 Medical Rec #: [id number]           Accession #:    [id number] Date of Birth: 05/27/1930          Patient Gender: M Patient Age:   89 years Exam Location:  [facility] Procedure:      VAS US CAROTID Referring Phys: Stephania Fragmin --------------------------------------------------------------------------------  Indications:       Syncope. Risk Factors:      Hypertension,  hyperlipidemia, past history of smoking,                    coronary artery disease. Other Factors:     Afib, CKD, CHF, TAVR. Comparison Study:  Previous exam on 12/27/21 RT 60-79% LT 1-39% Performing Technologist: Jody Hill RVT, RDMS  Examination Guidelines: A complete evaluation includes B-mode imaging, spectral Doppler, color Doppler, and power Doppler as needed of all accessible portions of each vessel. Bilateral testing is considered an integral part of a complete examination. Limited examinations for reoccurring indications may be performed as noted.  Right Carotid Findings: +----------+--------+--------+--------+----------------------+--------+           PSV cm/sEDV cm/sStenosisPlaque Description    Comments +----------+--------+--------+--------+----------------------+--------+ CCA Prox  61      9                                              +----------+--------+--------+--------+----------------------+--------+ CCA Distal70      17                                             +----------+--------+--------+--------+----------------------+--------+ ICA Prox  329     82      80-99%  calcific and irregular         +----------+--------+--------+--------+----------------------+--------+ ICA Mid   74      19                                             +----------+--------+--------+--------+----------------------+--------+ ICA Distal55      16                                             +----------+--------+--------+--------+----------------------+--------+  ECA       240     0       >50%                                   +----------+--------+--------+--------+----------------------+--------+ +----------+--------+-------+----------------+-------------------+           PSV cm/sEDV cmsDescribe        Arm Pressure (mmHG) +----------+--------+-------+----------------+-------------------+ [id number]            Multiphasic, WNL                     +----------+--------+-------+----------------+-------------------+ +---------+--------+--+--------+--+---------+ VertebralPSV cm/s50EDV cm/s14Antegrade +---------+--------+--+--------+--+---------+  Left Carotid Findings: +----------+--------+--------+--------+---------------------+------------------+           PSV cm/sEDV cm/sStenosisPlaque Description   Comments           +----------+--------+--------+--------+---------------------+------------------+ CCA Prox  84      11                                   intimal thickening +----------+--------+--------+--------+---------------------+------------------+ CCA Distal83      24                                   intimal thickening +----------+--------+--------+--------+---------------------+------------------+ ICA Prox  103     32      1-39%   calcific and                                                              irregular                               +----------+--------+--------+--------+---------------------+------------------+ ICA Distal75      20                                                      +----------+--------+--------+--------+---------------------+------------------+ ECA       121     0               calcific and focal                      +----------+--------+--------+--------+---------------------+------------------+ +----------+--------+--------+----------------+-------------------+           PSV cm/sEDV cm/sDescribe        Arm Pressure (mmHG) +----------+--------+--------+----------------+-------------------+ [id number]             Multiphasic, WNL                    +----------+--------+--------+----------------+-------------------+ +---------+--------+--+--------+--+---------+ VertebralPSV cm/s52EDV cm/s13Antegrade +---------+--------+--+--------+--+---------+   Summary: Right Carotid: Velocities in the right ICA are consistent with a 80-99%                stenosis.  The ECA appears >50% stenosed. Left Carotid: Velocities in the left ICA are consistent with a 1-39% stenosis. Vertebrals:  Bilateral vertebral arteries demonstrate antegrade flow. Subclavians: Normal flow hemodynamics were seen  in bilateral subclavian              arteries. *See table(s) above for measurements and observations.  Electronically signed by Heath Lark on 12/24/2022 at 4:50:50 PM.    Final    ECHOCARDIOGRAM COMPLETE  Result Date: 12/24/2022    ECHOCARDIOGRAM REPORT   Patient Name:   Norvil Blackaby. Date of Exam: 12/24/2022 Medical Rec #:  [id number]          Height:       68.0 in Accession #:    [id number]         Weight:       187.5 lb Date of Birth:  17-Jul-1930         BSA:          1.988 m Patient Age:    89 years           BP:           157/68 mmHg Patient Gender: M                  HR:           62 bpm. Exam Location:  Inpatient Procedure: 2D Echo, Cardiac Doppler and Color Doppler Indications:    Elevated Troponin  History:        Patient has prior history of Echocardiogram examinations, most                 recent 12/27/2021. CHF, CAD, Signs/Symptoms:Syncope and Dyspnea;                 Risk Factors:Hypertension and Dyslipidemia. CKD, stage 4.                 Aortic Valve: 26 mm Edwards Sapien prosthetic, stented (TAVR)                 valve is present in the aortic position. Procedure Date: 12/12/19.  Sonographer:    Lucendia Herrlich Referring Phys: Emeline General IMPRESSIONS  1. Left ventricular ejection fraction, by estimation, is 55 to 60%. The left ventricle has normal function. The left ventricle has no regional wall motion abnormalities. Left ventricular diastolic parameters are consistent with Grade I diastolic dysfunction (impaired relaxation).  2. Right ventricular systolic function is normal. The right ventricular size is normal.  3. The mitral valve is degenerative. Trivial mitral valve regurgitation. No evidence of mitral stenosis.  4. The aortic valve has been repaired/replaced.  Aortic valve regurgitation is not visualized. No aortic stenosis is present. There is a 26 mm Edwards Sapien prosthetic (TAVR) valve present in the aortic position. Procedure Date: 12/12/19. Aortic valve area, by VTI measures 1.85 cm. Aortic valve mean gradient measures 14.0 mmHg. Aortic valve Vmax measures 2.57 m/s. DVI 0.59.  5. The inferior vena cava is normal in size with greater than 50% respiratory variability, suggesting right atrial pressure of 3 mmHg  6. COmpared to study dated 12/27/21, the mean TAVR gradient has increased from 10 to , Vmax has increased from 2.13 to 2.82m/s and DVI has decreased from 0.74 to 0.59. FINDINGS  Left Ventricle: Left ventricular ejection fraction, by estimation, is 55 to 60%. The left ventricle has normal function. The left ventricle has no regional wall motion abnormalities. The left ventricular internal cavity size was normal in size. There is  no left ventricular hypertrophy. Left ventricular diastolic parameters are consistent with Grade I diastolic dysfunction (impaired relaxation). Normal left ventricular filling pressure. Right Ventricle: The right  ventricular size is normal. No increase in right ventricular wall thickness. Right ventricular systolic function is normal. Left Atrium: Left atrial size was normal in size. Right Atrium: Right atrial size was normal in size. Pericardium: There is no evidence of pericardial effusion. Mitral Valve: The mitral valve is degenerative in appearance. There is moderate calcification of the anterior mitral valve leaflet(s). Mild mitral annular calcification. Trivial mitral valve regurgitation. No evidence of mitral valve stenosis. Tricuspid Valve: The tricuspid valve is normal in structure. Tricuspid valve regurgitation is trivial. No evidence of tricuspid stenosis. Aortic Valve: The aortic valve has been repaired/replaced. Aortic valve regurgitation is not visualized. No aortic stenosis is present. Aortic valve mean gradient  measures 14.0 mmHg. Aortic valve peak gradient measures 26.5 mmHg. Aortic valve area, by VTI measures 1.85 cm. There is a 26 mm Edwards Sapien prosthetic, stented (TAVR) valve present in the aortic position. Procedure Date: 12/12/19. Pulmonic Valve: The pulmonic valve was normal in structure. Pulmonic valve regurgitation is not visualized. No evidence of pulmonic stenosis. Aorta: The aortic root is normal in size and structure. Venous: The inferior vena cava is normal in size with greater than 50% respiratory variability, suggesting right atrial pressure of 3 mmHg. IAS/Shunts: No atrial level shunt detected by color flow Doppler.  LEFT VENTRICLE PLAX 2D LVIDd:         4.00 cm   Diastology LVIDs:         2.80 cm   LV e' medial:    6.42 cm/s LV PW:         1.20 cm   LV E/e' medial:  12.4 LV IVS:        0.90 cm   LV e' lateral:   7.62 cm/s LVOT diam:     2.00 cm   LV E/e' lateral: 10.5 LV SV:         109 LV SV Index:   55 LVOT Area:     3.14 cm  RIGHT VENTRICLE RV S prime:     10.40 cm/s TAPSE (M-mode): 2.1 cm LEFT ATRIUM             Index        RIGHT ATRIUM           Index LA diam:        5.00 cm 2.51 cm/m   RA Area:     16.90 cm LA Vol (A2C):   77.8 ml 39.10 ml/m  RA Volume:   38.40 ml  19.31 ml/m LA Vol (A4C):   50.7 ml 25.50 ml/m LA Biplane Vol: 66.3 ml 33.34 ml/m  AORTIC VALVE AV Area (Vmax):    1.79 cm AV Area (Vmean):   1.84 cm AV Area (VTI):     1.85 cm AV Vmax:           257.33 cm/s AV Vmean:          169.500 cm/s AV VTI:            0.589 m AV Peak Grad:      26.5 mmHg AV Mean Grad:      14.0 mmHg LVOT Vmax:         146.80 cm/s LVOT Vmean:        99.420 cm/s LVOT VTI:          0.348 m LVOT/AV VTI ratio: 0.59  AORTA Ao Root diam: 2.80 cm Ao Asc diam:  3.60 cm MITRAL VALVE  TRICUSPID VALVE MV Area (PHT): 5.02 cm     TR Peak grad:   21.3 mmHg MV Decel Time: 151 msec     TR Vmax:        231.00 cm/s MV E velocity: 79.90 cm/s MV A velocity: 142.00 cm/s  SHUNTS MV E/A ratio:  0.56          Systemic VTI:  0.35 m                             Systemic Diam: 2.00 cm Armanda Magic MD Electronically signed by Armanda Magic MD Signature Date/Time: 12/24/2022/1:51:04 PM    Final    CT Chest Wo Contrast  Result Date: 12/23/2022 CLINICAL DATA:  Chest trauma, blunt EXAM: CT CHEST WITHOUT CONTRAST TECHNIQUE: Multidetector CT imaging of the chest was performed following the standard protocol without IV contrast. RADIATION DOSE REDUCTION: This exam was performed according to the departmental dose-optimization program which includes automated exposure control, adjustment of the mA and/or kV according to patient size and/or use of iterative reconstruction technique. COMPARISON:  11/29/2019 CT, 12/23/2022 x-ray FINDINGS: Cardiovascular: Heart size is borderline enlarged. No pericardial effusion. Thoracic aorta is nonaneurysmal. Prior TAVR and coronary artery stent. Atherosclerotic calcifications of the aorta and coronary arteries. Central pulmonary vasculature is nondilated. Mediastinum/Nodes: No enlarged mediastinal or axillary lymph nodes. Thyroid gland, trachea, and esophagus demonstrate no significant findings. Small hiatal hernia. Lungs/Pleura: Lungs are clear. No pleural effusion or pneumothorax. Upper Abdomen: No acute abnormality. Musculoskeletal: Chronic ununited fractures of the posterior right seventh through tenth ribs. Chronic well healed posterior right eleventh rib fracture. No acute rib fracture. Multilevel thoracic spondylosis. No chest wall hematoma. Bilateral gynecomastia. IMPRESSION: 1. No acute traumatic findings within the chest. 2. Multiple chronic right-sided rib fractures. 3. Small hiatal hernia. 4. Aortic and coronary artery atherosclerosis (ICD10-I70.0). Electronically Signed   By: Duanne Guess D.O.   On: 12/23/2022 15:46   CT Head Wo Contrast  Result Date: 12/23/2022 CLINICAL DATA:  Level 2 trauma EXAM: CT HEAD WITHOUT CONTRAST CT MAXILLOFACIAL WITHOUT CONTRAST CT CERVICAL SPINE  WITHOUT CONTRAST TECHNIQUE: Multidetector CT imaging of the head, cervical spine, and maxillofacial structures were performed using the standard protocol without intravenous contrast. Multiplanar CT image reconstructions of the cervical spine and maxillofacial structures were also generated. RADIATION DOSE REDUCTION: This exam was performed according to the departmental dose-optimization program which includes automated exposure control, adjustment of the mA and/or kV according to patient size and/or use of iterative reconstruction technique. COMPARISON:  None Available. FINDINGS: CT HEAD FINDINGS Brain: No evidence of acute infarction, hemorrhage, extra-axial collection, ventriculomegaly, or mass effect. Generalized cerebral atrophy. Periventricular white matter low attenuation likely secondary to microangiopathy. Left middle cranial fossa arachnoid cyst. Vascular: Cerebrovascular atherosclerotic calcifications are noted. No hyperdense vessels. Skull: Negative for fracture or focal lesion. Sinuses/Orbits: Visualized portions of the orbits are unremarkable. Visualized portions of the paranasal sinuses are unremarkable. Visualized portions of the mastoid air cells are unremarkable. Other: None. CT MAXILLOFACIAL FINDINGS Osseous: No fracture or mandibular dislocation. No destructive process. Orbits: Negative. No traumatic or inflammatory finding. Sinuses: Clear. Soft tissues: Right infraorbital soft tissue swelling. CT CERVICAL SPINE FINDINGS Alignment: Normal. Skull base and vertebrae: No acute fracture. No primary bone lesion or focal pathologic process. Soft tissues and spinal canal: No prevertebral fluid or swelling. No visible canal hematoma. Disc levels: Degenerative disease with disc height loss at C3-4, C4-5, C5-6, C6-7 and C7-T1. Broad-based disc osteophyte complex  and foraminal narrowing at C3-4. Broad-based disc osteophyte complex at C5-6, right uncovertebral degenerative changes and mild right foraminal  narrowing. Upper chest: Lung apices are clear. Other: No fluid collection or hematoma. IMPRESSION: 1. No acute intracranial pathology. 2. No acute osseous injury of the facial bones. 3. No acute osseous injury of the cervical spine. 4. Cervical spine spondylosis as described above. Electronically Signed   By: Elige Ko M.D.   On: 12/23/2022 13:31   CT Cervical Spine Wo Contrast  Result Date: 12/23/2022 CLINICAL DATA:  Level 2 trauma EXAM: CT HEAD WITHOUT CONTRAST CT MAXILLOFACIAL WITHOUT CONTRAST CT CERVICAL SPINE WITHOUT CONTRAST TECHNIQUE: Multidetector CT imaging of the head, cervical spine, and maxillofacial structures were performed using the standard protocol without intravenous contrast. Multiplanar CT image reconstructions of the cervical spine and maxillofacial structures were also generated. RADIATION DOSE REDUCTION: This exam was performed according to the departmental dose-optimization program which includes automated exposure control, adjustment of the mA and/or kV according to patient size and/or use of iterative reconstruction technique. COMPARISON:  None Available. FINDINGS: CT HEAD FINDINGS Brain: No evidence of acute infarction, hemorrhage, extra-axial collection, ventriculomegaly, or mass effect. Generalized cerebral atrophy. Periventricular white matter low attenuation likely secondary to microangiopathy. Left middle cranial fossa arachnoid cyst. Vascular: Cerebrovascular atherosclerotic calcifications are noted. No hyperdense vessels. Skull: Negative for fracture or focal lesion. Sinuses/Orbits: Visualized portions of the orbits are unremarkable. Visualized portions of the paranasal sinuses are unremarkable. Visualized portions of the mastoid air cells are unremarkable. Other: None. CT MAXILLOFACIAL FINDINGS Osseous: No fracture or mandibular dislocation. No destructive process. Orbits: Negative. No traumatic or inflammatory finding. Sinuses: Clear. Soft tissues: Right infraorbital soft  tissue swelling. CT CERVICAL SPINE FINDINGS Alignment: Normal. Skull base and vertebrae: No acute fracture. No primary bone lesion or focal pathologic process. Soft tissues and spinal canal: No prevertebral fluid or swelling. No visible canal hematoma. Disc levels: Degenerative disease with disc height loss at C3-4, C4-5, C5-6, C6-7 and C7-T1. Broad-based disc osteophyte complex and foraminal narrowing at C3-4. Broad-based disc osteophyte complex at C5-6, right uncovertebral degenerative changes and mild right foraminal narrowing. Upper chest: Lung apices are clear. Other: No fluid collection or hematoma. IMPRESSION: 1. No acute intracranial pathology. 2. No acute osseous injury of the facial bones. 3. No acute osseous injury of the cervical spine. 4. Cervical spine spondylosis as described above. Electronically Signed   By: Elige Ko M.D.   On: 12/23/2022 13:31   CT Maxillofacial Wo Contrast  Result Date: 12/23/2022 CLINICAL DATA:  Level 2 trauma EXAM: CT HEAD WITHOUT CONTRAST CT MAXILLOFACIAL WITHOUT CONTRAST CT CERVICAL SPINE WITHOUT CONTRAST TECHNIQUE: Multidetector CT imaging of the head, cervical spine, and maxillofacial structures were performed using the standard protocol without intravenous contrast. Multiplanar CT image reconstructions of the cervical spine and maxillofacial structures were also generated. RADIATION DOSE REDUCTION: This exam was performed according to the departmental dose-optimization program which includes automated exposure control, adjustment of the mA and/or kV according to patient size and/or use of iterative reconstruction technique. COMPARISON:  None Available. FINDINGS: CT HEAD FINDINGS Brain: No evidence of acute infarction, hemorrhage, extra-axial collection, ventriculomegaly, or mass effect. Generalized cerebral atrophy. Periventricular white matter low attenuation likely secondary to microangiopathy. Left middle cranial fossa arachnoid cyst. Vascular: Cerebrovascular  atherosclerotic calcifications are noted. No hyperdense vessels. Skull: Negative for fracture or focal lesion. Sinuses/Orbits: Visualized portions of the orbits are unremarkable. Visualized portions of the paranasal sinuses are unremarkable. Visualized portions of the mastoid air cells are unremarkable.  Other: None. CT MAXILLOFACIAL FINDINGS Osseous: No fracture or mandibular dislocation. No destructive process. Orbits: Negative. No traumatic or inflammatory finding. Sinuses: Clear. Soft tissues: Right infraorbital soft tissue swelling. CT CERVICAL SPINE FINDINGS Alignment: Normal. Skull base and vertebrae: No acute fracture. No primary bone lesion or focal pathologic process. Soft tissues and spinal canal: No prevertebral fluid or swelling. No visible canal hematoma. Disc levels: Degenerative disease with disc height loss at C3-4, C4-5, C5-6, C6-7 and C7-T1. Broad-based disc osteophyte complex and foraminal narrowing at C3-4. Broad-based disc osteophyte complex at C5-6, right uncovertebral degenerative changes and mild right foraminal narrowing. Upper chest: Lung apices are clear. Other: No fluid collection or hematoma. IMPRESSION: 1. No acute intracranial pathology. 2. No acute osseous injury of the facial bones. 3. No acute osseous injury of the cervical spine. 4. Cervical spine spondylosis as described above. Electronically Signed   By: Elige Ko M.D.   On: 12/23/2022 13:31   DG Chest Portable 1 View  Result Date: 12/23/2022 CLINICAL DATA:  Chest pain EXAM: PORTABLE CHEST 1 VIEW COMPARISON:  06/14/2022 FINDINGS: No focal consolidation. No pleural effusion or pneumothorax. Heart and mediastinal contours are unremarkable. Prior TAVR. No acute osseous abnormality. IMPRESSION: No active disease. Electronically Signed   By: Elige Ko M.D.   On: 12/23/2022 12:56   DG Pelvis Portable  Result Date: 12/23/2022 CLINICAL DATA:  Chest pain, trauma, fall. EXAM: PORTABLE PELVIS 1-2 VIEWS COMPARISON:  04/28/2009.  FINDINGS: Hip joint space is uniform bilaterally. No acute osseous abnormality. Brachytherapy seeds in the prostate. IMPRESSION: No acute findings. Electronically Signed   By: Leanna Battles M.D.   On: 12/23/2022 12:55    Cardiac Studies     Patient Profile     89 y.o. male with a hx of coronary artery disease, atrial fibrillation, s/p TAVR, chronic diastolic heart failure, CKD stage IV, chronic anemia, carotid artery disease, paroxysmal atrial fibrillation on eliquis who is being seen for the evaluation of chest pain and syncope   Assessment & Plan    Syncope: Patient was walking with his cane for a few steps and fell down on his right side. Does not remember any symptoms of dizziness/lightheadedness or palpitations prior to the fall. No seizure activity. No postictal confusion. After he regained consciousness, he complained of pain on the right side of his chest which probably is secondary to fall. EKG showed prolonged first-degree AV block with PR interval 400 ms (prior EKGs that showed PR interval around 170-200 msec in 2023). Orthostatics unremarkable.  Echo showed EF 55 to 60%, normal RV function, normal functioning bioprosthetic aortic valve -Decreased the dose of diltiazem from 240 mg to 120 mg once daily. He was previously on 120 mg which was increased to 240 mg in 01/2022. Recommend 2-week live monitor upon discharge.  Acute on chronic diastolic heart failure exacerbation: Volume overloaded on exam.  Most recent echo 12/2021 showed normal biventricular function -Appears euvolemic, will transition to home PO lasix   Carotid stenosis: duplex shows 80-99% right carotid stenosis.  This was discussed with [facility], not likely to cause syncope since unilateral stenosis.  Planning brain MRI to ensure no evidence of acute CVA.  If no CVA, plan outpatient f/u with [facility] for asymptomatic severe carotid stenosis.  If CVA, would discuss intervention for symptomatic carotid stenosis.  Continue Eliquis,  statin  Troponin elevation: mild troponin elevation, downtrending. Likely demand ischemia in setting of decompensated heart failure   Paroxysmal A-fib: History of CVA in 2023 -Decrease the dose  of diltiazem from 240 mg to 120 mg once daily (due to prolonged PR interval 400 msec and fall) -Okay to continue Eliquis 2.5 mg twice daily (benefit outweighs risk), though given history of falls would plan for outpatient referral for Watchman evaluation   CAD: s/p LAD PCI in 2016 and 2017 -Not on aspirin due to Eliquis use (will discontinue aspirin 325 mg) -Continue rosuvastatin 10 mg nightly   Severe aortic valve stenosis s/p TAVR in 2021 -Echo shows normal functioning TAVR valve  CKD stage IV: Cr 2.59, appears about baseline (2.5-3.0).    For questions or updates, please contact [facility] Please consult [email or web address] for contact info under        Signed, Little Ishikawa, MD  12/25/2022, 9:17 AM

## 2022-12-26 ENCOUNTER — Encounter: Payer: Self-pay | Admitting: Oncology

## 2022-12-28 ENCOUNTER — Telehealth: Payer: Self-pay

## 2022-12-28 ENCOUNTER — Ambulatory Visit: Payer: Self-pay

## 2022-12-28 ENCOUNTER — Telehealth: Payer: Self-pay | Admitting: Family Medicine

## 2022-12-28 DIAGNOSIS — D631 Anemia in chronic kidney disease: Secondary | ICD-10-CM | POA: Diagnosis not present

## 2022-12-28 DIAGNOSIS — N184 Chronic kidney disease, stage 4 (severe): Secondary | ICD-10-CM | POA: Diagnosis not present

## 2022-12-28 DIAGNOSIS — F32A Depression, unspecified: Secondary | ICD-10-CM | POA: Diagnosis not present

## 2022-12-28 DIAGNOSIS — M19041 Primary osteoarthritis, right hand: Secondary | ICD-10-CM | POA: Diagnosis not present

## 2022-12-28 DIAGNOSIS — G8929 Other chronic pain: Secondary | ICD-10-CM | POA: Diagnosis not present

## 2022-12-28 DIAGNOSIS — M545 Low back pain, unspecified: Secondary | ICD-10-CM | POA: Diagnosis not present

## 2022-12-28 DIAGNOSIS — G43909 Migraine, unspecified, not intractable, without status migrainosus: Secondary | ICD-10-CM | POA: Diagnosis not present

## 2022-12-28 DIAGNOSIS — M19011 Primary osteoarthritis, right shoulder: Secondary | ICD-10-CM | POA: Diagnosis not present

## 2022-12-28 DIAGNOSIS — S50911A Unspecified superficial injury of right forearm, initial encounter: Secondary | ICD-10-CM | POA: Diagnosis not present

## 2022-12-28 DIAGNOSIS — M19071 Primary osteoarthritis, right ankle and foot: Secondary | ICD-10-CM | POA: Diagnosis not present

## 2022-12-28 DIAGNOSIS — M19042 Primary osteoarthritis, left hand: Secondary | ICD-10-CM | POA: Diagnosis not present

## 2022-12-28 DIAGNOSIS — I69398 Other sequelae of cerebral infarction: Secondary | ICD-10-CM | POA: Diagnosis not present

## 2022-12-28 DIAGNOSIS — F419 Anxiety disorder, unspecified: Secondary | ICD-10-CM | POA: Diagnosis not present

## 2022-12-28 DIAGNOSIS — M19072 Primary osteoarthritis, left ankle and foot: Secondary | ICD-10-CM | POA: Diagnosis not present

## 2022-12-28 DIAGNOSIS — I700 Atherosclerosis of aorta: Secondary | ICD-10-CM | POA: Diagnosis not present

## 2022-12-28 DIAGNOSIS — I2510 Atherosclerotic heart disease of native coronary artery without angina pectoris: Secondary | ICD-10-CM | POA: Diagnosis not present

## 2022-12-28 DIAGNOSIS — M47812 Spondylosis without myelopathy or radiculopathy, cervical region: Secondary | ICD-10-CM | POA: Diagnosis not present

## 2022-12-28 DIAGNOSIS — M1712 Unilateral primary osteoarthritis, left knee: Secondary | ICD-10-CM | POA: Diagnosis not present

## 2022-12-28 DIAGNOSIS — R2689 Other abnormalities of gait and mobility: Secondary | ICD-10-CM | POA: Diagnosis not present

## 2022-12-28 DIAGNOSIS — I5033 Acute on chronic diastolic (congestive) heart failure: Secondary | ICD-10-CM | POA: Diagnosis not present

## 2022-12-28 DIAGNOSIS — M19012 Primary osteoarthritis, left shoulder: Secondary | ICD-10-CM | POA: Diagnosis not present

## 2022-12-28 DIAGNOSIS — E785 Hyperlipidemia, unspecified: Secondary | ICD-10-CM | POA: Diagnosis not present

## 2022-12-28 DIAGNOSIS — I130 Hypertensive heart and chronic kidney disease with heart failure and stage 1 through stage 4 chronic kidney disease, or unspecified chronic kidney disease: Secondary | ICD-10-CM | POA: Diagnosis not present

## 2022-12-28 DIAGNOSIS — I480 Paroxysmal atrial fibrillation: Secondary | ICD-10-CM | POA: Diagnosis not present

## 2022-12-28 DIAGNOSIS — S2241XD Multiple fractures of ribs, right side, subsequent encounter for fracture with routine healing: Secondary | ICD-10-CM | POA: Diagnosis not present

## 2022-12-28 NOTE — Telephone Encounter (Signed)
Home Health Verbal Orders  Agency:  [facility]  Caller: Barry Dienes [phone]   Requesting OT/ PT/ Skilled nursing/ Social Work/ Speech:  Skilled nursing  Reason for Request:  Dressing wounds and cardiac teaching due to new diagnoses  Frequency:  2 times x 1 week then  1 time x 2 weeks

## 2022-12-28 NOTE — Telephone Encounter (Signed)
Called and left detailed message on Tonya vm with VO.

## 2022-12-28 NOTE — Transitions of Care (Post Inpatient/ED Visit) (Signed)
12/28/2022  Name: Mark Jackson. MRN: [id number] DOB: 02-29-1930  Today's TOC FU Call Status: Today's TOC FU Call Status:: Successful TOC FU Call Competed TOC FU Call Complete Date: 12/28/22  Transition Care Management Follow-up Telephone Call Date of Discharge: 12/25/22 Discharge Facility: [name]) Type of Discharge: Inpatient Admission Primary Inpatient Discharge Diagnosis:: "NSTEMI" How have you been since you were released from the hospital?: Better (Pt states he is 'doing better than he was while he was in the hospital-sleeping good and eating good." He voices he has some chronic BLE edema to legs but not any worse than when he was in hospital.) Any questions or concerns?: No  Items Reviewed: Did you receive and understand the discharge instructions provided?: Yes Medications obtained,verified, and reconciled?: Yes (Medications Reviewed) Any new allergies since your discharge?: No Dietary orders reviewed?: Yes Type of Diet Ordered:: low salt/heart healthy Do you have support at home?: Yes People in Home: child(ren), adult Name of Support/Comfort Primary Source: pt states dauhgter helps him out as needed  Medications Reviewed Today: Medications Reviewed Today     Reviewed by Charlyn Minerva, RN (Registered Nurse) on 12/28/22 at 1059  Med List Status: <None>   Medication Order Taking? Sig Documenting Provider Last Dose Status Informant  acetaminophen (TYLENOL) 325 MG tablet [id number] Yes Take 2 tablets (650 mg total) by mouth every 4 (four) hours as needed for mild pain (or temp > 37.5 C (99.5 F)). Charlton Amor, PA-C Taking Active Self           Med Note (CARD, AMY L   Wed Dec 23, 2022  2:22 PM) PRN  amoxicillin (AMOXIL) 500 MG capsule [id number] Yes Take 500 mg by mouth as needed (Use for pre dental procedure). [provider] Taking Active   apixaban (ELIQUIS) 2.5 MG TABS tablet [id number] Yes Take 1 tablet (2.5 mg total) by mouth 2 (two)  times daily. Shelva Majestic, MD Taking Active Self  diclofenac Sodium (VOLTAREN) 1 % GEL [id number] Yes Apply 2 g topically 4 (four) times daily. Charlton Amor, PA-C Taking Active Self           Med Note (CARD, AMY L   Wed Dec 23, 2022  2:23 PM) PRN  diltiazem (CARDIZEM CD) 120 MG 24 hr capsule [id number] Yes Take 1 capsule (120 mg total) by mouth daily. Dimple Nanas, MD Taking Active   ezetimibe (ZETIA) 10 MG tablet [id number] Yes Take 1 tablet (10 mg total) by mouth daily. Shelva Majestic, MD Taking Active Self  ferrous sulfate 325 (65 FE) MG tablet [id number] Yes Take 325 mg by mouth daily with breakfast. [provider] Taking Active Self           Med Note Orion Crook, Dante Gang Jan 01, 2022 11:28 AM) PTA med from prior admission carried over for CIR discharge reconciliation  FLUoxetine (PROZAC) 10 MG capsule [id number] Yes Take 1 capsule (10 mg total) by mouth daily. Shelva Majestic, MD Taking Active Self  furosemide (LASIX) 40 MG tablet [id number] Yes Take 1 tablet (40 mg total) by mouth daily. Shelva Majestic, MD Taking Active Self  isosorbide mononitrate (IMDUR) 60 MG 24 hr tablet [id number] Yes TAKE ONE TAB DAILY AFTER BREAKFAST. MAY TAKE AN ADDITONAL TAB IN EVENING X2DAYS IF USE NITROGLYCER Shelva Majestic, MD Taking Active Self  Multiple Vitamins-Minerals (CENTRUM SILVER ADULT 50+) TABS [id number] Yes Take 1 tablet by mouth daily. [provider] Taking Active  Self  nitroGLYCERIN (NITROSTAT) 0.4 MG SL tablet [id number] Yes ONE TABLET UNDER TONGUE WHEN NEEDED FOR CHEST PAIN. MAY REPEAT IN 5 MINUTES. Marykay Lex, MD Taking Active Self           Med Note (CARD, AMY L   Wed Dec 23, 2022  2:26 PM) PRN  rosuvastatin (CRESTOR) 10 MG tablet [id number] Yes TAKE (1) TABLET DAILY AT BEDTIME. Shelva Majestic, MD Taking Active Self  topiramate (TOPAMAX) 100 MG tablet [id number] Yes Take 1 tablet (100 mg total) by mouth 2 (two) times daily. Kirsteins, Victorino Sparrow, MD  Taking Active Self  traMADol (ULTRAM) 50 MG tablet [id number] Yes Take 50 mg by mouth 3 (three) times daily as needed. Shelva Majestic, MD Taking Active Self           Med Note (CARD, AMY L   Wed Dec 23, 2022  2:26 PM) PRN            Home Care and Equipment/Supplies: Were Home Health Services Ordered?: Yes [facility] of [facility] Agency[name] Has Agency set up a time to come to your home?: Yes First Home Health Visit Date: 12/28/22 (pt reports someone coming out today around 11:30am for initial visit) Any new equipment or medical supplies ordered?: No  Functional Questionnaire: Do you need assistance with bathing/showering or dressing?: No Do you need assistance with meal preparation?: No Do you need assistance with eating?: No Do you have difficulty maintaining continence: No Do you need assistance with getting out of bed/getting out of a chair/moving?: No Do you have difficulty managing or taking your medications?: No  Follow up appointments reviewed: PCP Follow-up appointment confirmed?: No (Pt states his daughter manages his appts and she will be calling office to make an appt-declined needing assistance making appt) MD Provider Line [phone] Given: No Specialist Hospital Follow-up appointment confirmed?: Yes Date of Specialist follow-up appointment?: 01/08/23 Follow-Up Specialty Provider:: Liberty Handy Do you need transportation to your follow-up appointment?: No Do you understand care options if your condition(s) worsen?: Yes-patient verbalized understanding  SDOH Interventions Today    Flowsheet Row Most Recent Value  SDOH Interventions   Food Insecurity Interventions Intervention Not Indicated  Transportation Interventions Intervention Not Indicated      TOC Interventions Today    Flowsheet Row Most Recent Value  TOC Interventions   TOC Interventions Discussed/Reviewed TOC Interventions Discussed, Post discharge activity limitations per provider       Interventions Today    Flowsheet Row Most Recent Value  Chronic Disease   Chronic disease during today's visit Atrial Fibrillation (AFib)  General Interventions   General Interventions Discussed/Reviewed General Interventions Discussed, Doctor Visits, Durable Medical Equipment (DME), Referral to Nurse  [follow up appt scheduled with assigned RN care coordinator]  Doctor Visits Discussed/Reviewed Doctor Visits Discussed, Specialist, PCP  Durable Medical Equipment (DME) Other  [scale-wgt monitoring]  PCP/Specialist Visits Compliance with follow-up visit  Education Interventions   Education Provided Provided Education  Provided Verbal Education On Nutrition, Medication, When to see the doctor, Other  [cardiac sx mgmt]  Nutrition Interventions   Nutrition Discussed/Reviewed Nutrition Discussed, Adding fruits and vegetables, Decreasing salt  Pharmacy Interventions   Pharmacy Dicussed/Reviewed Pharmacy Topics Discussed, Medications and their functions  Safety Interventions   Safety Discussed/Reviewed Safety Discussed       Alessandra Grout [facility] Care Management Care Management Community Coordinator Direct Phone: [phone] Toll Free: [phone] Fax: [phone]

## 2022-12-28 NOTE — Chronic Care Management (AMB) (Signed)
   12/28/2022  Mark Jackson. Jan 08, 1930 [id number]   Reason for Encounter: Patient is not currently enrolled in the CCM program. CCM status changed to previously enrolled.   [facility]/Chronic Care Management [phone]

## 2022-12-29 ENCOUNTER — Encounter: Payer: Self-pay | Admitting: Physician Assistant

## 2022-12-29 ENCOUNTER — Ambulatory Visit (INDEPENDENT_AMBULATORY_CARE_PROVIDER_SITE_OTHER): Payer: Medicare Other | Admitting: Physician Assistant

## 2022-12-29 VITALS — BP 140/80 | HR 77 | Temp 97.8°F | Ht 68.0 in | Wt 194.4 lb

## 2022-12-29 DIAGNOSIS — D631 Anemia in chronic kidney disease: Secondary | ICD-10-CM | POA: Diagnosis not present

## 2022-12-29 DIAGNOSIS — E538 Deficiency of other specified B group vitamins: Secondary | ICD-10-CM | POA: Diagnosis not present

## 2022-12-29 DIAGNOSIS — T148XXA Other injury of unspecified body region, initial encounter: Secondary | ICD-10-CM | POA: Diagnosis not present

## 2022-12-29 DIAGNOSIS — Z4802 Encounter for removal of sutures: Secondary | ICD-10-CM | POA: Diagnosis not present

## 2022-12-29 DIAGNOSIS — I5033 Acute on chronic diastolic (congestive) heart failure: Secondary | ICD-10-CM | POA: Diagnosis not present

## 2022-12-29 DIAGNOSIS — I480 Paroxysmal atrial fibrillation: Secondary | ICD-10-CM | POA: Diagnosis not present

## 2022-12-29 DIAGNOSIS — N184 Chronic kidney disease, stage 4 (severe): Secondary | ICD-10-CM | POA: Diagnosis not present

## 2022-12-29 DIAGNOSIS — S50911A Unspecified superficial injury of right forearm, initial encounter: Secondary | ICD-10-CM | POA: Diagnosis not present

## 2022-12-29 DIAGNOSIS — I130 Hypertensive heart and chronic kidney disease with heart failure and stage 1 through stage 4 chronic kidney disease, or unspecified chronic kidney disease: Secondary | ICD-10-CM | POA: Diagnosis not present

## 2022-12-29 MED ORDER — CYANOCOBALAMIN 1000 MCG/ML IJ SOLN
1000.0000 ug | Freq: Once | INTRAMUSCULAR | Status: AC
Start: 2022-12-29 — End: 2022-12-29
  Administered 2022-12-29: 1000 ug via INTRAMUSCULAR

## 2022-12-29 NOTE — Progress Notes (Signed)
Mark Conly. is a 89 y.o. male here for a follow up of a pre-existing problem.  History of Present Illness:   Chief Complaint  Patient presents with   Suture / Staple Removal    Needs sutures removed from right cheek.    Suture / Staple Removal    Underwent facial laceration repair in ER on 12/23/22 after sustaining fall. Had 6 sutures placed on right maxillary area He has been keeping area clean and applying ointment  He also has skin tears on right arm These are being dressed by wound care staff coming out to his home  Daughter with him today says he is overall doing well  Denies: fever, chills, new pain to areas, purulent discharge from facial laceration or skin tears  Past Medical History:  Diagnosis Date   Anemia    Anxiety    Arthritis    "shoulders, hands; knees, ankles" (06/09/2016)   CAD S/P percutaneous coronary angioplasty 03/21/2015; 06/09/2016   a. NSTEMI 8/'16: Prox LAD 80% --> PCI 2.75 x 16 mm Synergy DES -- 3.3 mm; b. Crescendo Angina 10/'17: Synergy DES 3.0x12 (3.6 mm) to ostial-proxmial LAD onverlaps prior stent proximally.; c) 04/2019 - patent stents. Mod AS   Carotid artery disease (HCC)    Right carotid 60-80% stenosis; stable from 2013-2014   Chronic diarrhea    "at least a couple times/month since knee OR in 2010" (06/09/2016)   Chronic kidney disease (CKD), stage III (moderate) B    Creatinine roughly 1.8-2.0   Chronic lower back pain    "have had several injections; I see Dr. Ethelene Hal"   Dyspnea    Essential hypertension 10/22/2008   Qualifier: Diagnosis of  By: Koleen Distance CMA (AAMA), Leisha     Hyperlipidemia    Long term current use of anticoagulant therapy 08/27/2014   Now on Eliquis   Migraine    "at least once/month; I take preventative RX for it" (03/13/2015) (06/09/2016)   Moderate aortic stenosis by prior echocardiogram 12/08/2016   Progression from mild to moderate stenosis by Echo 12/2017 -> Moderate aortic stenosis (mean-P gradient 20  mmHg - 35 mmHg.).- stable 04/2019 (but Cath Mean gradient ~30 mmHg)   Obesity (BMI 30-39.9) 09/03/2013   Paroxysmal atrial fibrillation (HCC) 08/20/2014   Status post TEE cardioversion; on Eliquis; CHA2DS2Vasc = 4-5.   Prostate cancer (HCC)    "~ 68 seeds implanted"   S/P TAVR (transcatheter aortic valve replacement) 12/12/2019   s/p TAVR with a 26 mm Edwards S3U via the left subclavian approach by Drs Excell Seltzer and Bartle - Echo 01/10/2020; EF 60 to 65%.  GR one DD.  No R WMA.  Normal RV.  26 mm Edwards SAPIEN prosthetic TAVR present.  No perivalvular AI.  No stenosis.  Mean gradient 13 mmHg.  Stable from initial post TAVR gradients.    Skin cancer    "burned off my face, legs, and chest" (06/09/2016)     Social History   Tobacco Use   Smoking status: Former    Types: Pipe, Cigars    Quit date: 08/10/1968    Years since quitting: 54.4   Smokeless tobacco: Never  Vaping Use   Vaping Use: Never used  Substance Use Topics   Alcohol use: No    Comment: 06/09/2016; 03/13/2015 "I have drank in my life; not more than a gallon in my lifetime; don't drink anymore"   Drug use: No    Past Surgical History:  Procedure Laterality Date   APPENDECTOMY  CARDIAC CATHETERIZATION N/A 03/21/2015   Procedure: Left Heart Cath and Coronary Angiography;  Surgeon: Corky Crafts, MD;  Location: [facility] INVASIVE CV LAB;  Service: Cardiovascular;  Laterality: N/A;; 80% pLAD   CARDIAC CATHETERIZATION  03/21/2015   Procedure: Coronary Stent Intervention;  Surgeon: Corky Crafts, MD;  Location: [facility] INVASIVE CV LAB;  Service: Cardiovascular;;pLAD Synergy DES 2.75 mmx 16 mm -- 3.3 mm   CARDIAC CATHETERIZATION N/A 06/09/2016   Procedure: LEFT HEART CATHETERIZATION WITH CORNARY ANGIOGRAPHY.  Surgeon: Marykay Lex, MD;  Location: [facility] INVASIVE CV LAB;  Service: Cardiovascular.  Essentially stable coronaries, but to 85% lesion proximal to prior LAD stent with 40% proximal stent ISR. FFR was significantly positive.    CARDIAC CATHETERIZATION N/A 06/09/2016   Procedure: Coronary Stent Intervention;  Surgeon: Marykay Lex, MD;  Location: [facility] INVASIVE CV LAB;  Service: Cardiovascular: FFR Guided PCI of pLAD ~80% pre-stent & 40% ISR --> Synergy DES 3.0 x12  (3.6 mm extends to~ LM)   CARDIOVERSION N/A 08/22/2014   Procedure: CARDIOVERSION;  Surgeon: Wendall Stade, MD;  Location: [facility] ENDOSCOPY;  Service: Cardiovascular;  Laterality: N/A;   CAROTID DOPPLER  10/21/2012   Continues to have 60 to 79% right carotid.  Left carotid < 40%.  Normal vertebral and subclavian arteries bilaterally.  (Stable.  Follow-up 1 year.)   CATARACT EXTRACTION W/ INTRAOCULAR LENS  IMPLANT, BILATERAL Bilateral    COLONOSCOPY     INSERTION PROSTATE RADIATION SEED  04/2007   KNEE ARTHROSCOPY Bilateral    LEFT HEART CATH AND CORONARY ANGIOGRAPHY N/A 04/26/2019   Procedure: LEFT HEART CATH AND CORONARY ANGIOGRAPHY;  Surgeon: Marykay Lex, MD;  Location: [facility] INVASIVE CV LAB;  Service: Cardiovascular;Widely patent LAD stents.  Normal LVEDP.  Evidence of moderate-severe aortic stenosis with mean gradient 31 milli-mercury and P-peak gradient of 36 mmHg   NM MYOVIEW LTD  05/2018   a) 08/2014: 60%. Fixed inferior defect likely diaphragmatic attenuation. LOW RISK. ;; b) 05/2018 Lexiscan - EF 55-60%. LOW RISK. No ischemia or infarction.   TEE WITHOUT CARDIOVERSION N/A 08/22/2014   Procedure: TRANSESOPHAGEAL ECHOCARDIOGRAM (TEE);  Surgeon: Wendall Stade, MD;  Location: [facility] ENDOSCOPY;  Service: Cardiovascular;  Laterality: N/A;   TEE WITHOUT CARDIOVERSION N/A 12/12/2019   Procedure: TRANSESOPHAGEAL ECHOCARDIOGRAM (TEE);  Surgeon: Tonny Bollman, MD;  Location: [facility] OR;  Service: Open Heart Surgery;  Laterality: N/A;   TONSILLECTOMY AND ADENOIDECTOMY     TOTAL KNEE ARTHROPLASTY Right 05/2009   TRANSCATHETER AORTIC VALVE REPLACEMENT, TRANSFEMORAL  12/12/2019   Surgeon: Tonny Bollman, MD;  Location: [facility] OR;  Service: Open Heart Surgery;: Stephannie Peters  3 Ultra transcatheter heart valve (size 26 mm)   TRANSTHORACIC ECHOCARDIOGRAM  03/'20, 9'20   a) EF 60 to 65%.  Mild to moderate MR.  Moderate aortic calcification.  Mild to mod AS.  Mean gradient 22 mmHg;; b)  Normal LV size and function EF 60 to 65%.  Trivial AI, mod AS with mean gradient estimated 20 mmHg (no change from March 2019)   TRANSTHORACIC ECHOCARDIOGRAM  01/10/2020   1st out-of-hospital post TAVR echo: EF 60 to 65%.  GR one DD.  No R WMA.  Normal RV.  26 mm Edwards SAPIEN prosthetic TAVR present.  No perivalvular AI.  No stenosis.  Mean gradient 13 mmHg.  Stable from initial post TAVR gradients.    TRANSTHORACIC ECHOCARDIOGRAM  12/04/2020    26 mm SAPIEN TAVR valve.  Mean gradient increased to 26 mmHg.  (Increased from 13 mmHg  in June 2021).  Suggests possible prosthetic valve obstruction.  LVEF 60%.  Wall motion.  GR 1 DD.  Normal RV.  Moderate LA dilation.   TRANSTHORACIC ECHOCARDIOGRAM  02/26/2021   LVEF 60 to 65%.No RWMA.  Indeterminate DF.  Normal RV.  Mild to moderate LA dilation.  Normal MV.   26 mm Sapien prosthetic (TAVR) valve present in the aortic position => Reduced AoV mean gradient-now 16 mmHg down from 26 mmHg..    Family History  Problem Relation Age of Onset   Uterine cancer Mother        dx unknown age   Migraines Father    Suicidality Father    Other Brother        murdered   Other Brother        MVA, deceased   Testicular cancer Son 53   Other Daughter        ATM gene mutation   Colon cancer Neg Hx    Esophageal cancer Neg Hx    Stomach cancer Neg Hx    Rectal cancer Neg Hx     No Known Allergies  Current Medications:   Current Outpatient Medications:    acetaminophen (TYLENOL) 325 MG tablet, Take 2 tablets (650 mg total) by mouth every 4 (four) hours as needed for mild pain (or temp > 37.5 C (99.5 F))., Disp: , Rfl:    amoxicillin (AMOXIL) 500 MG capsule, Take 500 mg by mouth as needed (Use for pre dental procedure)., Disp: , Rfl:    apixaban  (ELIQUIS) 2.5 MG TABS tablet, Take 1 tablet (2.5 mg total) by mouth 2 (two) times daily., Disp: 180 tablet, Rfl: 3   diclofenac Sodium (VOLTAREN) 1 % GEL, Apply 2 g topically 4 (four) times daily., Disp: 2 g, Rfl: 0   diltiazem (CARDIZEM CD) 120 MG 24 hr capsule, Take 1 capsule (120 mg total) by mouth daily., Disp: 30 capsule, Rfl: 0   ezetimibe (ZETIA) 10 MG tablet, Take 1 tablet (10 mg total) by mouth daily., Disp: 90 tablet, Rfl: 3   ferrous sulfate 325 (65 FE) MG tablet, Take 325 mg by mouth daily with breakfast., Disp: , Rfl:    FLUoxetine (PROZAC) 10 MG capsule, Take 1 capsule (10 mg total) by mouth daily., Disp: 30 capsule, Rfl: 2   furosemide (LASIX) 40 MG tablet, Take 1 tablet (40 mg total) by mouth daily., Disp: 90 tablet, Rfl: 3   isosorbide mononitrate (IMDUR) 60 MG 24 hr tablet, TAKE ONE TAB DAILY AFTER BREAKFAST. MAY TAKE AN ADDITONAL TAB IN EVENING X2DAYS IF USE NITROGLYCER, Disp: 120 tablet, Rfl: 3   Multiple Vitamins-Minerals (CENTRUM SILVER ADULT 50+) TABS, Take 1 tablet by mouth daily., Disp: , Rfl:    nitroGLYCERIN (NITROSTAT) 0.4 MG SL tablet, ONE TABLET UNDER TONGUE WHEN NEEDED FOR CHEST PAIN. MAY REPEAT IN 5 MINUTES., Disp: 25 tablet, Rfl: 0   rosuvastatin (CRESTOR) 10 MG tablet, TAKE (1) TABLET DAILY AT BEDTIME., Disp: 90 tablet, Rfl: 3   topiramate (TOPAMAX) 100 MG tablet, Take 1 tablet (100 mg total) by mouth 2 (two) times daily., Disp: 60 tablet, Rfl: 3   traMADol (ULTRAM) 50 MG tablet, Take 50 mg by mouth 3 (three) times daily as needed., Disp: , Rfl:    Review of Systems:   ROS Negative unless otherwise specified per HPI.  Vitals:   Vitals:   12/29/22 1352  BP: (!) 140/80  Pulse: 77  Temp: 97.8 F (36.6 C)  TempSrc: Temporal  SpO2: 99%  Weight: 194 lb 6.1 oz (88.2 kg)  Height: 5\' 8"  (1.727 m)     Body mass index is 29.56 kg/m.  Physical Exam:   Physical Exam Vitals and nursing note reviewed.  Constitutional:      Appearance: He is well-developed.   HENT:     Head: Normocephalic.  Eyes:     Conjunctiva/sclera: Conjunctivae normal.     Pupils: Pupils are equal, round, and reactive to light.  Pulmonary:     Effort: Pulmonary effort is normal.  Musculoskeletal:        General: Normal range of motion.     Cervical back: Normal range of motion.  Skin:    General: Skin is warm and dry.     Comments: Large scab with 6 sutures to R maxillary region Surrounding ecchymosis No swelling appreciated or discharge from site  Multiple dressed skin tears to R arm  Neurological:     Mental Status: He is alert and oriented to person, place, and time.  Psychiatric:        Behavior: Behavior normal.        Thought Content: Thought content normal.        Judgment: Judgment normal.    Patient presents for suture removal. The wound is well healed without signs of infection.  The sutures are removed. Wound care and activity instructions given.    Assessment and Plan:   Visit for suture removal Sutures removed Area appears to be healing quite well No concerns for infection  Multiple skin tears Did not evaluate this personally but did provide wound care supplies so he has some until shipment comes on Monday to he can change Has excellent care at home  Follow-up if any concerns for infection  B12 deficiency Shot provided today  Jarold Motto, PA-C

## 2022-12-30 DIAGNOSIS — I480 Paroxysmal atrial fibrillation: Secondary | ICD-10-CM | POA: Diagnosis not present

## 2022-12-30 DIAGNOSIS — I130 Hypertensive heart and chronic kidney disease with heart failure and stage 1 through stage 4 chronic kidney disease, or unspecified chronic kidney disease: Secondary | ICD-10-CM | POA: Diagnosis not present

## 2022-12-30 DIAGNOSIS — I5033 Acute on chronic diastolic (congestive) heart failure: Secondary | ICD-10-CM | POA: Diagnosis not present

## 2022-12-30 DIAGNOSIS — N184 Chronic kidney disease, stage 4 (severe): Secondary | ICD-10-CM | POA: Diagnosis not present

## 2022-12-30 DIAGNOSIS — S50911A Unspecified superficial injury of right forearm, initial encounter: Secondary | ICD-10-CM | POA: Diagnosis not present

## 2022-12-30 DIAGNOSIS — D631 Anemia in chronic kidney disease: Secondary | ICD-10-CM | POA: Diagnosis not present

## 2022-12-31 ENCOUNTER — Telehealth: Payer: Self-pay | Admitting: Family Medicine

## 2022-12-31 ENCOUNTER — Telehealth: Payer: Self-pay

## 2022-12-31 ENCOUNTER — Ambulatory Visit: Payer: Medicare Other

## 2022-12-31 DIAGNOSIS — I5033 Acute on chronic diastolic (congestive) heart failure: Secondary | ICD-10-CM | POA: Diagnosis not present

## 2022-12-31 DIAGNOSIS — R55 Syncope and collapse: Secondary | ICD-10-CM

## 2022-12-31 DIAGNOSIS — N184 Chronic kidney disease, stage 4 (severe): Secondary | ICD-10-CM | POA: Diagnosis not present

## 2022-12-31 DIAGNOSIS — I130 Hypertensive heart and chronic kidney disease with heart failure and stage 1 through stage 4 chronic kidney disease, or unspecified chronic kidney disease: Secondary | ICD-10-CM | POA: Diagnosis not present

## 2022-12-31 DIAGNOSIS — S50911A Unspecified superficial injury of right forearm, initial encounter: Secondary | ICD-10-CM | POA: Diagnosis not present

## 2022-12-31 DIAGNOSIS — I480 Paroxysmal atrial fibrillation: Secondary | ICD-10-CM | POA: Diagnosis not present

## 2022-12-31 DIAGNOSIS — D631 Anemia in chronic kidney disease: Secondary | ICD-10-CM | POA: Diagnosis not present

## 2022-12-31 NOTE — Telephone Encounter (Signed)
Yes thanks but he will need to keep visit on 01/29/23 for face to face

## 2022-12-31 NOTE — Telephone Encounter (Signed)
-----   Message from Jonita Albee, PA-C sent at 12/25/2022  3:10 PM EDT ----- Regarding: Mark Jackson Hey!  This patient has afib and is on eliquis. However, he has been having frequent falls and Dr. Bjorn Pippin would like him to be evaluated for Watchman. Can you please get him in with either Dr. Lalla Brothers or Dr. Excell Seltzer?  Thanks! KJ

## 2022-12-31 NOTE — Telephone Encounter (Signed)
Called and provided VO for pt on Jim's confidential vm.

## 2022-12-31 NOTE — Telephone Encounter (Signed)
Home Health Verbal Orders  Agency:  [name]  Caller:  Threasa Alpha  Contact and title  Requesting OT/ PT/ Skilled nursing/ Social Work/ Speech:    Reason for Request:  Evaluated yesterday, verbal orders and update, PT recommendation, 2 times a week for 4 weeks, then 1 time a week for 4 weeks, BP 160/90, no symptoms, better than his normal  Frequency:    HH needs F2F w/in last 30 days     [phone]

## 2022-12-31 NOTE — Telephone Encounter (Signed)
Beth with [facility] states: Patient blood pressure is 162/72.  Patient was asking about trying to get compression devices like he received at the Hospital.

## 2022-12-31 NOTE — Telephone Encounter (Signed)
Per Dr. Bjorn Pippin, scheduled the patient for [facility] consult with Dr. Lalla Brothers 01/05/2023. He was grateful for call and agreed with plan.

## 2023-01-01 ENCOUNTER — Telehealth: Payer: Self-pay | Admitting: Cardiology

## 2023-01-01 DIAGNOSIS — N184 Chronic kidney disease, stage 4 (severe): Secondary | ICD-10-CM | POA: Diagnosis not present

## 2023-01-01 DIAGNOSIS — I5033 Acute on chronic diastolic (congestive) heart failure: Secondary | ICD-10-CM | POA: Diagnosis not present

## 2023-01-01 DIAGNOSIS — D631 Anemia in chronic kidney disease: Secondary | ICD-10-CM | POA: Diagnosis not present

## 2023-01-01 DIAGNOSIS — S50911A Unspecified superficial injury of right forearm, initial encounter: Secondary | ICD-10-CM | POA: Diagnosis not present

## 2023-01-01 DIAGNOSIS — R55 Syncope and collapse: Secondary | ICD-10-CM | POA: Diagnosis not present

## 2023-01-01 DIAGNOSIS — I130 Hypertensive heart and chronic kidney disease with heart failure and stage 1 through stage 4 chronic kidney disease, or unspecified chronic kidney disease: Secondary | ICD-10-CM | POA: Diagnosis not present

## 2023-01-01 DIAGNOSIS — I480 Paroxysmal atrial fibrillation: Secondary | ICD-10-CM | POA: Diagnosis not present

## 2023-01-01 NOTE — Telephone Encounter (Signed)
See below

## 2023-01-01 NOTE — Telephone Encounter (Signed)
Call to patient to gather any information on his BP.  LVM to please call.

## 2023-01-01 NOTE — Telephone Encounter (Signed)
SCD systems are typically very expensive (prevent blood clots when in hospital beds) if that's what he is asking about - I do not think we can get those covered. His eliquis can reduce risk though  As far as blood pressure- lets monitor daily over weekend -  make sure seated and at rest for 5 minutes- if high repeat in 5 minutes- update me on Tuesday or Wednesday with daily readings to see if we need to adjust medications. If chest pain, shortness of breath, headache(s) , blurry vision needs to seek care over weekend

## 2023-01-01 NOTE — Telephone Encounter (Signed)
New Message:    Pt c/o BP issue: STAT if pt c/o blurred vision, one-sided weakness or slurred speech  1. What are your last 5 BP readings? 180/96 today- no symptoms  2. Are you having any other symptoms (ex. Dizziness, headache, blurred vision, passed out)?   3. What is your BP issue? [name] is calling to give you an test update on patient's last blood pressure reading.- [name] wants  to know what blood pressure range do you want, before they need  to give you a call

## 2023-01-04 NOTE — Progress Notes (Unsigned)
  Electrophysiology Office Note:    Date:  01/05/2023   ID:  [name], DOB 06-20-1930, MRN [id number]  [facility] Cardiologist:  Bryan Lemma, MD  [facility] Electrophysiologist:  None   Referring MD: Shelva Majestic, MD   Chief Complaint: Atrial fibrillation  History of Present Illness:    Mark Jackson. is a 89 y.o. male who I am seeing today for an evaluation of atrial fibrillation at the request of Dr. Bjorn Pippin.  The patient has a history of coronary artery disease, severe aortic stenosis post TAVR, chronic diastolic heart failure, CKD 4, chronic anemia, carotid artery disease and paroxysmal atrial fibrillation.  He was recently admitted to the hospital earlier this month with chest pain and syncope. During that hospitalization his Eliquis was continued at 2.5 mg by mouth twice daily given history of falls.  He was referred to discuss alternatives to systemic anticoagulation given his history of falls and increased bleeding risk.  Baseline creatinine 2.5-3.  He is with his daughter today in clinic.  He has fallen multiple times. Most recently he fell in the kitchen when he was walking with a cane rather than his rollator. He is not driving. 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 He has been using Eliquis 2.5mg  PO BID reliably.      Their past medical, social and family history was reveiwed.   ROS:   Please see the history of present illness.    All other systems reviewed and are negative.  EKGs/Labs/Other Studies Reviewed:    The following studies were reviewed today:  ZIO monitor pending  Dec 24, 2022 echo EF 55-60 RV normal Trivial MR Aortic prosthesis functioning appropriately     Physical Exam:    VS:  BP 130/80   Pulse 70   Ht 5\' 8"  (1.727 m)   Wt 196 lb (88.9 kg)   SpO2 98%   BMI 29.80 kg/m     Wt Readings from Last 3 Encounters:  01/05/23 196 lb (88.9 kg)  12/29/22 194 lb 6.1 oz  (88.2 kg)  12/25/22 187 lb 8 oz (85 kg)     GEN:  Well nourished, well developed in no acute distress. Elderly. CARDIAC: RRR, no murmurs, rubs, gallops RESPIRATORY:  Clear to auscultation without rales, wheezing or rhonchi       ASSESSMENT AND PLAN:    No diagnosis found.  #Atrial fibrillation #Frequent falls The patient was recently mid to the hospital after a fall with injury.  Given his history of atrial fibrillation he has been referred to discuss alternatives to systemic anticoagulation. We had a long discussion today about the risks and benefits of being on anticoagulation versus coming off anticoagulation versus proceeding with left atrial appendage occlusion.  Given his multiple advanced medical comorbidities and advanced age, I do not think he is at acceptable risk to undergo watchman implantation.  I would recommend continuing Eliquis 2.5 mg by mouth twice daily.  #CKD 4 Baseline creatinine 2.5-3 Discussed link today between advanced CKD and procedural risk.  #Severe aortic stenosis post TAVR Prosthesis functioning appropriately.       Total time spent with patient today 60 minutes. This includes reviewing records, evaluating the patient and coordinating care.     Signed, Rossie Muskrat. Lalla Brothers, MD, [facility], [facility] 01/05/2023 8:18 PM    Electrophysiology [facility]

## 2023-01-05 ENCOUNTER — Telehealth: Payer: Self-pay

## 2023-01-05 ENCOUNTER — Encounter: Payer: Self-pay | Admitting: Cardiology

## 2023-01-05 ENCOUNTER — Ambulatory Visit: Payer: Medicare Other | Attending: Cardiology | Admitting: Cardiology

## 2023-01-05 ENCOUNTER — Ambulatory Visit: Payer: Self-pay

## 2023-01-05 VITALS — BP 130/80 | HR 70 | Ht 68.0 in | Wt 196.0 lb

## 2023-01-05 DIAGNOSIS — R55 Syncope and collapse: Secondary | ICD-10-CM | POA: Insufficient documentation

## 2023-01-05 DIAGNOSIS — N184 Chronic kidney disease, stage 4 (severe): Secondary | ICD-10-CM | POA: Diagnosis not present

## 2023-01-05 DIAGNOSIS — I480 Paroxysmal atrial fibrillation: Secondary | ICD-10-CM | POA: Insufficient documentation

## 2023-01-05 DIAGNOSIS — I455 Other specified heart block: Secondary | ICD-10-CM | POA: Diagnosis present

## 2023-01-05 NOTE — Patient Outreach (Signed)
  Care Coordination   Initial Visit Note   01/05/2023 Name: Mark Jackson. MRN: [id number] DOB: 1930-06-06  Mark Pao. is a 89 y.o. year old male who sees Hunter, Aldine Contes, MD for primary care. I spoke with  Mark Pao. by phone today.  What matters to the patients health and wellness today?  Maintain health    Goals Addressed             This Visit's Progress    A,Fib managment       Care Coordination Interventions: Counseled on increased risk of stroke due to Afib and benefits of anticoagulation for stroke prevention Reviewed importance of adherence to anticoagulant exactly as prescribed  Patient doing well. Pending consult for watchman device.  Weight 186 lbs.          SDOH assessments and interventions completed:  Yes  SDOH Interventions Today    Flowsheet Row Most Recent Value  SDOH Interventions   Food Insecurity Interventions Intervention Not Indicated  Housing Interventions Intervention Not Indicated        Care Coordination Interventions:  Yes, provided   Follow up plan: Follow up call scheduled for June    Encounter Outcome:  Pt. Visit Completed   Bary Leriche, RN, MSN [facility] Care Management Care Management Coordinator Direct Line [phone]

## 2023-01-05 NOTE — Telephone Encounter (Signed)
Spoke with patient and he states he "does not take it".  When prompted, he states caretaker takes it. 156, 160's over 90 His caretaker would like parameters for when to call the provider.   Please advise.

## 2023-01-05 NOTE — Telephone Encounter (Signed)
[employer] report for April 2021 cCT:     

## 2023-01-05 NOTE — Patient Instructions (Signed)
Visit Information  Thank you for taking time to visit with me today. Please don't hesitate to contact me if I can be of assistance to you.   Following are the goals we discussed today:   Goals Addressed             This Visit's Progress    A,Fib managment       Care Coordination Interventions: Counseled on increased risk of stroke due to Afib and benefits of anticoagulation for stroke prevention Reviewed importance of adherence to anticoagulant exactly as prescribed  Patient doing well. Pending consult for watchman device.  Weight 186 lbs.          Our next appointment is by telephone on 01/18/23 at 130 pm  Please call the care guide team at [phone] if you need to cancel or reschedule your appointment.   If you are experiencing a Mental Health or Behavioral Health Crisis or need someone to talk to, please call the Suicide and Crisis Lifeline: 988   Patient verbalizes understanding of instructions and care plan provided today and agrees to view in MyChart. Active MyChart status and patient understanding of how to access instructions and care plan via MyChart confirmed with patient.     The patient has been provided with contact information for the care management team and has been advised to call with any health related questions or concerns.   Bary Leriche, RN, MSN [facility] Care Management Care Management Coordinator Direct Line [phone]

## 2023-01-05 NOTE — Patient Instructions (Signed)
Medication Instructions:  Your physician recommends that you continue on your current medications as directed. Please refer to the Current Medication list given to you today.  *If you need a refill on your cardiac medications before your next appointment, please call your pharmacy*  Follow-Up: At [facility], you and your health needs are our priority.  As part of our continuing mission to provide you with exceptional heart care, we have created designated Provider Care Teams.  These Care Teams include your primary Cardiologist (physician) and Advanced Practice Providers (APPs -  Physician Assistants and Nurse Practitioners) who all work together to provide you with the care you need, when you need it.  Your next appointment:   As needed with Dr. Lambert 

## 2023-01-05 NOTE — Progress Notes (Signed)
I have personally reviewed this encounter including the documentation in this note and have collaborated with the care management provider regarding care management and care coordination activities to include development and update of the comprehensive care plan. I am certifying that I agree with the content of this note and encounter as supervising physician. ° °Giovana Faciane, MD  °

## 2023-01-05 NOTE — Telephone Encounter (Signed)
Spoke with patient regarding Blood pressure and parameters.  He states understanding.  He was unable to make an appt as he is in the car headed to another appt.  Advised would have scheduling reach out to him to get him in for July.  He is wearing heart monitor at this time and results need to be reviewed by Dr Herbie Baltimore per last OV note

## 2023-01-05 NOTE — Telephone Encounter (Signed)
I have not seen him in about 14 months.  He was just in the hospital.  He is 89 years old, unless he is having symptoms, his blood pressure can range from 110-170 systolic.  If he is having heart failure symptoms, then we need to treat it.  If it is elevated, recheck it in about an hour and if still elevated then we may build to do so that about a but until we have sustained pressure readings over 170 I would just leave it alone.  The 1 reading of 180/96 is high, but if the next reading is 160/80, then we do not need to treat.  Bryan Lemma, MD

## 2023-01-06 ENCOUNTER — Other Ambulatory Visit: Payer: Self-pay

## 2023-01-06 ENCOUNTER — Telehealth: Payer: Self-pay

## 2023-01-06 ENCOUNTER — Emergency Department (HOSPITAL_COMMUNITY): Payer: Medicare Other

## 2023-01-06 ENCOUNTER — Observation Stay (HOSPITAL_COMMUNITY)
Admission: EM | Admit: 2023-01-06 | Discharge: 2023-01-07 | Disposition: A | Discharge status: Home / Self Care | Payer: Medicare Other | Attending: Cardiology | Admitting: Cardiology

## 2023-01-06 ENCOUNTER — Telehealth: Payer: Self-pay | Admitting: Cardiology

## 2023-01-06 DIAGNOSIS — R55 Syncope and collapse: Secondary | ICD-10-CM | POA: Diagnosis present

## 2023-01-06 DIAGNOSIS — I455 Other specified heart block: Principal | ICD-10-CM | POA: Diagnosis present

## 2023-01-06 DIAGNOSIS — I480 Paroxysmal atrial fibrillation: Secondary | ICD-10-CM | POA: Diagnosis present

## 2023-01-06 DIAGNOSIS — I10 Essential (primary) hypertension: Secondary | ICD-10-CM

## 2023-01-06 DIAGNOSIS — I130 Hypertensive heart and chronic kidney disease with heart failure and stage 1 through stage 4 chronic kidney disease, or unspecified chronic kidney disease: Secondary | ICD-10-CM | POA: Insufficient documentation

## 2023-01-06 DIAGNOSIS — Z96651 Presence of right artificial knee joint: Secondary | ICD-10-CM | POA: Diagnosis not present

## 2023-01-06 DIAGNOSIS — N184 Chronic kidney disease, stage 4 (severe): Secondary | ICD-10-CM | POA: Diagnosis not present

## 2023-01-06 DIAGNOSIS — I509 Heart failure, unspecified: Secondary | ICD-10-CM | POA: Diagnosis not present

## 2023-01-06 DIAGNOSIS — Z87891 Personal history of nicotine dependence: Secondary | ICD-10-CM | POA: Insufficient documentation

## 2023-01-06 DIAGNOSIS — N1832 Chronic kidney disease, stage 3b: Secondary | ICD-10-CM | POA: Insufficient documentation

## 2023-01-06 DIAGNOSIS — I5032 Chronic diastolic (congestive) heart failure: Secondary | ICD-10-CM | POA: Insufficient documentation

## 2023-01-06 DIAGNOSIS — R600 Localized edema: Secondary | ICD-10-CM

## 2023-01-06 DIAGNOSIS — E785 Hyperlipidemia, unspecified: Secondary | ICD-10-CM | POA: Diagnosis present

## 2023-01-06 DIAGNOSIS — Z955 Presence of coronary angioplasty implant and graft: Secondary | ICD-10-CM | POA: Diagnosis not present

## 2023-01-06 DIAGNOSIS — I469 Cardiac arrest, cause unspecified: Secondary | ICD-10-CM | POA: Diagnosis not present

## 2023-01-06 DIAGNOSIS — Z7901 Long term (current) use of anticoagulants: Secondary | ICD-10-CM | POA: Diagnosis not present

## 2023-01-06 DIAGNOSIS — I495 Sick sinus syndrome: Secondary | ICD-10-CM | POA: Insufficient documentation

## 2023-01-06 DIAGNOSIS — Z85828 Personal history of other malignant neoplasm of skin: Secondary | ICD-10-CM | POA: Diagnosis not present

## 2023-01-06 DIAGNOSIS — I2510 Atherosclerotic heart disease of native coronary artery without angina pectoris: Secondary | ICD-10-CM | POA: Diagnosis not present

## 2023-01-06 DIAGNOSIS — I5030 Unspecified diastolic (congestive) heart failure: Secondary | ICD-10-CM | POA: Diagnosis not present

## 2023-01-06 DIAGNOSIS — Z8546 Personal history of malignant neoplasm of prostate: Secondary | ICD-10-CM | POA: Insufficient documentation

## 2023-01-06 DIAGNOSIS — I25118 Atherosclerotic heart disease of native coronary artery with other forms of angina pectoris: Secondary | ICD-10-CM

## 2023-01-06 DIAGNOSIS — Z79899 Other long term (current) drug therapy: Secondary | ICD-10-CM | POA: Diagnosis not present

## 2023-01-06 DIAGNOSIS — R079 Chest pain, unspecified: Secondary | ICD-10-CM | POA: Diagnosis not present

## 2023-01-06 DIAGNOSIS — D6869 Other thrombophilia: Secondary | ICD-10-CM

## 2023-01-06 LAB — CBC
HCT: 33 % — ABNORMAL LOW (ref 39.0–52.0)
HCT: 32.1 % — ABNORMAL LOW (ref 39.0–52.0)
Hemoglobin: 10.6 g/dL — ABNORMAL LOW (ref 13.0–17.0)
Hemoglobin: 10.2 g/dL — ABNORMAL LOW (ref 13.0–17.0)
MCH: 33.3 pg (ref 26.0–34.0)
MCH: 33.2 pg (ref 26.0–34.0)
MCHC: 32.1 g/dL (ref 30.0–36.0)
MCHC: 31.8 g/dL (ref 30.0–36.0)
MCV: 103.8 fL — ABNORMAL HIGH (ref 80.0–100.0)
MCV: 104.6 fL — ABNORMAL HIGH (ref 80.0–100.0)
Platelets: 149 10*3/uL — ABNORMAL LOW (ref 150–400)
Platelets: 148 10*3/uL — ABNORMAL LOW (ref 150–400)
RBC: 3.18 MIL/uL — ABNORMAL LOW (ref 4.22–5.81)
RBC: 3.07 MIL/uL — ABNORMAL LOW (ref 4.22–5.81)
RDW: 13 % (ref 11.5–15.5)
RDW: 13 % (ref 11.5–15.5)
WBC: 11 10*3/uL — ABNORMAL HIGH (ref 4.0–10.5)
WBC: 11.7 10*3/uL — ABNORMAL HIGH (ref 4.0–10.5)
nRBC: 0 % (ref 0.0–0.2)
nRBC: 0 % (ref 0.0–0.2)

## 2023-01-06 LAB — BASIC METABOLIC PANEL
Anion gap: 12 (ref 5–15)
Anion gap: 13 (ref 5–15)
BUN: 65 mg/dL — ABNORMAL HIGH (ref 8–23)
BUN: 66 mg/dL — ABNORMAL HIGH (ref 8–23)
CO2: 19 mmol/L — ABNORMAL LOW (ref 22–32)
CO2: 20 mmol/L — ABNORMAL LOW (ref 22–32)
Calcium: 8.7 mg/dL — ABNORMAL LOW (ref 8.9–10.3)
Calcium: 8.7 mg/dL — ABNORMAL LOW (ref 8.9–10.3)
Chloride: 109 mmol/L (ref 98–111)
Chloride: 108 mmol/L (ref 98–111)
Creatinine, Ser: 2.95 mg/dL — ABNORMAL HIGH (ref 0.61–1.24)
Creatinine, Ser: 2.92 mg/dL — ABNORMAL HIGH (ref 0.61–1.24)
GFR, Estimated: 20 mL/min — ABNORMAL LOW (ref >60)
GFR, Estimated: 20 mL/min — ABNORMAL LOW (ref >60)
Glucose, Bld: 99 mg/dL (ref 70–99)
Glucose, Bld: 99 mg/dL (ref 70–99)
Potassium: 4.7 mmol/L (ref 3.5–5.1)
Potassium: 4.6 mmol/L (ref 3.5–5.1)
Sodium: 140 mmol/L (ref 135–145)
Sodium: 141 mmol/L (ref 135–145)

## 2023-01-06 LAB — TROPONIN I (HIGH SENSITIVITY)
Troponin I (High Sensitivity): 17 ng/L (ref <18)
Troponin I (High Sensitivity): 16 ng/L (ref <18)

## 2023-01-06 LAB — PROTIME-INR
INR: 1.1 (ref 0.8–1.2)
Prothrombin Time: 14.7 seconds (ref 11.4–15.2)

## 2023-01-06 MED ORDER — ISOSORBIDE MONONITRATE ER 60 MG PO TB24
60.0000 mg | ORAL_TABLET | Freq: Every day | ORAL | Status: DC
  Administered 2023-01-06 – 2023-01-07 (×2): 60 mg via ORAL
  Filled 2023-01-06 (×2): qty 1

## 2023-01-06 MED ORDER — POLYETHYLENE GLYCOL 3350 17 G PO PACK
17.0000 g | PACK | Freq: Every day | ORAL | Status: DC
  Filled 2023-01-06: qty 1

## 2023-01-06 MED ORDER — MEDIHONEY WOUND/BURN DRESSING EX PSTE
1.0000 | PASTE | Freq: Every day | CUTANEOUS | Status: DC
  Administered 2023-01-06 – 2023-01-07 (×2): 1 via TOPICAL
  Filled 2023-01-06: qty 44

## 2023-01-06 MED ORDER — FLUOXETINE HCL 10 MG PO CAPS
10.0000 mg | ORAL_CAPSULE | Freq: Every day | ORAL | Status: DC
  Administered 2023-01-06 – 2023-01-07 (×2): 10 mg via ORAL
  Filled 2023-01-06 (×2): qty 1

## 2023-01-06 MED ORDER — TOPIRAMATE 25 MG PO TABS
100.0000 mg | ORAL_TABLET | Freq: Two times a day (BID) | ORAL | Status: DC
  Administered 2023-01-06 – 2023-01-07 (×3): 100 mg via ORAL
  Filled 2023-01-06 (×3): qty 4

## 2023-01-06 MED ORDER — ROSUVASTATIN CALCIUM 5 MG PO TABS
10.0000 mg | ORAL_TABLET | Freq: Every day | ORAL | Status: DC
  Administered 2023-01-06: 10 mg via ORAL
  Filled 2023-01-06: qty 2

## 2023-01-06 MED ORDER — ACETAMINOPHEN 325 MG PO TABS
650.0000 mg | ORAL_TABLET | Freq: Four times a day (QID) | ORAL | Status: DC | PRN
  Administered 2023-01-06: 650 mg via ORAL
  Filled 2023-01-06: qty 2

## 2023-01-06 MED ORDER — EZETIMIBE 10 MG PO TABS
10.0000 mg | ORAL_TABLET | Freq: Every day | ORAL | Status: DC
  Administered 2023-01-06 – 2023-01-07 (×2): 10 mg via ORAL
  Filled 2023-01-06 (×2): qty 1

## 2023-01-06 MED ORDER — FUROSEMIDE 40 MG PO TABS
40.0000 mg | ORAL_TABLET | Freq: Every day | ORAL | Status: DC
  Administered 2023-01-06 – 2023-01-07 (×2): 40 mg via ORAL
  Filled 2023-01-06 (×2): qty 1

## 2023-01-06 NOTE — Telephone Encounter (Signed)
Received call from [employer] about critical EKG result on ambulatory cardiac monitor (ordered 12/25/22 for recurrent syncope evaluation). Per report, had a sinus pause for 6.4 seconds around 10:45pm on 01/05/23 with ventricular asystole. Surrounding rhythm is NSR.  I called patient to inform him of the result. He notes that he currently is without CP, SOB, DOE, lightheadedness/dizziness, presyncope/syncope, palpitations, or skipped beats, but does note that as he was getting ready for bed around 10:45pm he did note an episode of lightheadedness and presyncope that eventually resolved on its own. I discussed with him that I was concerned that those symptoms were related to his sinus pause of 6.4 seconds and were indicative of some degree of hypoperfusion to the brain and recommended he get someone/call EMS to bring him to the ED for further evaluation. At the very least, he should be monitored on telemetry with pads present to ensure that he does not develop unstable bradycardia. He will additionally need a workup to see if a pacemaker would be beneficial for him to protect him from his sinus sinus syndrome. In the meantime, I asked him to not take any more of his home diltiazem to prevent anymore AV nodal blockade. He voiced understanding and said he would call his family to bring in to the ED for evaluation. I will route this message to his cardiologist Dr. Herbie Baltimore and EP Dr. Lalla Brothers as an Lorain Childes.  Bella Kennedy, MD Cardiology 01/06/2023
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
## 2023-01-06 NOTE — Progress Notes (Signed)
Patient has three wound noted on admission  2 on his left elbow and one on the right one.we'll continue to monitor.

## 2023-01-06 NOTE — Patient Instructions (Signed)
Visit Information  Thank you for taking time to visit with me today. Please don't hesitate to contact me if I can be of assistance to you.   Following are the goals we discussed today:   Goals Addressed             This Visit's Progress    A,Fib managment       Care Coordination Interventions: Counseled on increased risk of stroke due to Afib and benefits of anticoagulation for stroke prevention Reviewed importance of adherence to anticoagulant exactly as prescribed  Patient doing well. Pending consult for watchman device.  Weight 186 lbs.  01/06/23 Call from daughter asking about home health.  Patient in hospital presently after abnormal heart rhythm.  Advised that home health company is [employer].  Contact information provided.          Our next appointment is by telephone on 01/18/23 at 100 pm  Please call the care guide team at [phone] if you need to cancel or reschedule your appointment.   If you are experiencing a Mental Health or Behavioral Health Crisis or need someone to talk to, please call the Suicide and Crisis Lifeline: 988   Patient verbalizes understanding of instructions and care plan provided today and agrees to view in MyChart. Active MyChart status and patient understanding of how to access instructions and care plan via MyChart confirmed with patient.     The patient has been provided with contact information for the care management team and has been advised to call with any health related questions or concerns.   Bary Leriche, RN, MSN [facility] Care Management Care Management Coordinator Direct Line [phone]

## 2023-01-06 NOTE — Consult Note (Signed)
WOC Nurse Consult Note: Reason for Consult: skin tears to bilateral elbows and right upper forearm.  Daughter at bedside and states patient had a fall at home recently.  Wound type:trauma from fall at home.  Pressure Injury POA: NA Measurement: Left lateral elbow:  3 cm x 3.2 cm x 0.1 cm (skin tear with unapproximated edges)  Right lateral elbow:   2 cm x 2.4 cm x 0.1 cm  Right lateral forearm (near elbow) 3.4 cm x 2.5 cm x 0.1 cm  *Wounds to right arm with devitalized tissue to wound bed.  (Skin flap that is sloughing away)  Wound bed: pink and moist to left arm Gray devitalized tissue to right arm wounds.  Drainage (amount, consistency, odor) minimal serosanguinous  no odor Periwound: Paper thin skin to arms Dressing procedure/placement/frequency: Cleanse skin tears to left and right elbow with NS and pat dry. Apply medihoney to wound bed.  Cover with silicone foam dressing for atraumatic dressing removal.  Change daily.  Will not follow at this time.  Please re-consult if needed.  Mike Gip MSN, RN, FNP-BC CWON Wound, Ostomy, Continence Nurse Outpatient [facility] [phone] Pager [phone]

## 2023-01-06 NOTE — TOC Initial Note (Signed)
Transition of Care (TOC) - Initial/Assessment Note    Patient Details  Name: Mark Jackson. MRN: [id number] Date of Birth: [birth year]  Transition of Care [facility]) CM/SW Contact:    Lawerance Sabal, RN Phone Number: 01/06/2023, 2:55 PM  Clinical Narrative:                  Confirmed with agency that patient is active w [name] RN PT.  Resumption order placed.  TOC will continue to follow in progression rounds for additional needs   Expected Discharge Plan: Home w Home Health Services Barriers to Discharge: Continued Medical Work up   Patient Goals and CMS Choice   CMS Medicare.gov Compare Post Acute Care list provided to:: Patient Choice offered to / list presented to : Patient      Expected Discharge Plan and Services   Discharge Planning Services: CM Consult Post Acute Care Choice: Home Health Living arrangements for the past 2 months: Single Family Home                           HH Arranged: RN, PT [facility] Agency: [facility] Date [facility] Agency Contacted: 01/06/23 Time HH Agency Contacted: 1455 Representative spoke with at [facility] Agency: Kandee Keen  Prior Living Arrangements/Services Living arrangements for the past 2 months: Single Family Home Lives with:: Relatives              Current home services: DME (cane walker)    Activities of Daily Living      Permission Sought/Granted                  Emotional Assessment              Admission diagnosis:  Sinus arrest [I45.5] Sinus node dysfunction (HCC) [I49.5] Patient Active Problem List   Diagnosis Date Noted   Sinus arrest 01/06/2023   Carotid stenosis, right 12/25/2022   Demand ischemia 12/24/2022   Genetic testing 12/22/2022   Gait disturbance, post-stroke 07/28/2022   B12 neuropathy (HCC) 07/28/2022   Right hemiparesis (HCC) 03/05/2022   Situational depression 03/05/2022   Loud snoring 02/04/2022   Left thalamic infarction (HCC) 12/29/2021   Thrombocytopenia, unspecified  (HCC) 12/11/2021   Hematuria 12/11/2020   Acute on chronic diastolic heart failure (HCC) 12/12/2019   S/P TAVR (transcatheter aortic valve replacement) 12/12/2019   Syncope 11/27/2019   Fatigue 11/08/2017   Hyperglycemia 09/27/2017   Bilateral lower extremity edema 04/15/2017   B12 deficiency 01/06/2017   Chronic diarrhea 01/06/2017   BPH associated with nocturia 06/15/2016   Crescendo angina (HCC) 06/01/2016   Perianal dermatitis 06/19/2015   Rectal bleeding 04/25/2015   CAD S/P DES PCI to proximal LAD 03/22/2015   Aortic stenosis 03/14/2015   Dyspnea 08/31/2014   Long term current use of anticoagulant therapy 08/27/2014   PAF (paroxysmal atrial fibrillation) (HCC) 08/20/2014    Class: Diagnosis of   Chronic kidney disease (CKD), stage IV (severe) (HCC) 08/20/2014   Hereditary and idiopathic peripheral neuropathy 01/12/2014   H/O syncope 09/03/2013   Right-sided carotid artery disease (HCC) 03/02/2013   Hyperlipidemia with target LDL less than 70 03/02/2013   Migraine without aura 10/26/2012   Macrocytic anemia 10/23/2008   GLAUCOMA 10/23/2008   Essential hypertension 10/22/2008   Hemorrhoids 10/22/2008   Arthropathy 10/22/2008   Excessive daytime sleepiness 10/22/2008   Personal history of prostate cancer 10/22/2008   History of colonic polyps 10/22/2008   PCP:  Shelva Majestic, MD Pharmacy:   [facility] [place], [place] - [address] [place] [address] Phone: [phone] Fax: [phone]     Social Determinants of Health (SDOH) Social History: SDOH Screenings   Food Insecurity: No Food Insecurity (01/05/2023)  Housing: Low Risk  (01/05/2023)  Transportation Needs: No Transportation Needs (12/28/2022)  Utilities: Not At Risk (12/24/2022)  Depression (PHQ2-9): Medium Risk (12/29/2022)  Financial Resource Strain: Low Risk  (11/26/2022)  Physical Activity: Sufficiently Active (11/26/2022)  Social  Connections: Moderately Integrated (11/26/2022)  Stress: No Stress Concern Present (11/26/2022)  Tobacco Use: Medium Risk (01/05/2023)   SDOH Interventions:     Readmission Risk Interventions     No data to display

## 2023-01-06 NOTE — ED Triage Notes (Signed)
Patient received a call from Dr. Juel Burrow advised him to go to ER due to abnormal reading " pause" of his heart monitor this evening . Denies chest pain /respirations unlabored .

## 2023-01-06 NOTE — ED Provider Notes (Signed)
[facility] DEPT [facility] Emergency Department Provider Note MRN:  [id number]  Arrival date & time: 01/06/23     Chief Complaint   Irregular Heart Beat   History of Present Illness   Mark Jackson. is a 89 y.o. year-old male presents to the ED with chief complaint of abnormal heart rhythm.  Patient was recently admitted to the hospital for syncope.  Was sent home with a Zio patch heart monitor.  Patient states that he was called by Dr. Juel Burrow, cardiology, tonight and was notified that he had a 6.5-second pause.  Patient had felt lightheaded before bed.  Per note from Dr. Juel Burrow, there is some concern for hypoperfusion to the brain, and recommended that he come to the ER for evaluation.  Patient states that he's not sure what happened, but did feel lightheaded earlier.  He denies any symptoms now.    History provided by patient.   Review of Systems  Pertinent positive and negative review of systems noted in HPI.    Physical Exam   Vitals:   01/06/23 0154  BP: (!) 178/88  Pulse: 71  Resp: 17  Temp: 97.6 F (36.4 C)  SpO2: 100%    CONSTITUTIONAL:  well-appearing, NAD NEURO:  Alert and oriented x 3, CN 3-12 grossly intact EYES:  eyes equal and reactive ENT/NECK:  Supple, no stridor  CARDIO:  normal rate, regular rhythm, appears well-perfused  PULM:  No respiratory distress, CTAB GI/GU:  non-distended,  MSK/SPINE:  No gross deformities, no edema, moves all extremities  SKIN:  no rash, atraumatic   *Additional and/or pertinent findings included in MDM below  Diagnostic and Interventional Summary    EKG Interpretation  Date/Time:  Wednesday Jan 06 2023 01:49:19 EDT Ventricular Rate:  66 PR Interval:  368 QRS Duration: 96 QT Interval:  408 QTC Calculation: 427 R Axis:   -42 Text Interpretation: Sinus rhythm with 1st degree A-V block Left axis deviation Minimal voltage criteria for LVH, may be normal variant ( R in aVL ) Anterior infarct , age undetermined T  wave abnormality, consider lateral ischemia Abnormal ECG When compared with ECG of 23-Dec-2022 12:14,  no change Confirmed by Gilda Crease [phone]) on 01/06/2023 2:40:33 AM       Labs Reviewed  BASIC METABOLIC PANEL - Abnormal; Notable for the following components:      Result Value   CO2 19 (*)    BUN 65 (*)    Creatinine, Ser 2.95 (*)    Calcium 8.7 (*)    GFR, Estimated 20 (*)    All other components within normal limits  CBC - Abnormal; Notable for the following components:   WBC 11.0 (*)    RBC 3.18 (*)    Hemoglobin 10.6 (*)    HCT 33.0 (*)    MCV 103.8 (*)    Platelets 149 (*)    All other components within normal limits  PROTIME-INR  TROPONIN I (HIGH SENSITIVITY)  TROPONIN I (HIGH SENSITIVITY)    DG Chest 2 View  Final Result      Medications - No data to display   Procedures  /  Critical Care Procedures  ED Course and Medical Decision Making  I have reviewed the triage vital signs, the nursing notes, and pertinent available records from the EMR.  Social Determinants Affecting Complexity of Care: Patient has no clinically significant social determinants affecting this chief complaint..   ED Course:    Medical Decision Making Amount and/or Complexity of Data  Reviewed Labs: ordered.    Details: No significant electrolyte abnormality Trop 17 ->admit to cards PT INR 1.1 Radiology: ordered.  Risk Decision regarding hospitalization.     Consultants: I consulted with Dr. Juel Burrow, who recommends admission for monitoring on telemetry.  Hold diltiazem.   Treatment and Plan: Patient's exam and diagnostic results are concerning for dysrhythmia.  Feel that patient will need admission to the hospital for further treatment and evaluation.    Final Clinical Impressions(s) / ED Diagnoses     ICD-10-CM   1. Sinus node dysfunction (HCC)  I49.5       ED Discharge Orders     None         Discharge Instructions Discussed with and Provided to  Patient:   Discharge Instructions   None      Roxy Horseman, PA-C 01/06/23 1610    Gilda Crease, MD 01/06/23 [phone]
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
## 2023-01-06 NOTE — Progress Notes (Signed)
Patient arrived to the floor with the ED RN, alert and oriented  with his daughter. Standup scale obtained, CHG bath given. Patient oriented to staff, room and equipment. Cardiac monitoring initiated,, CCMD notified, we'll continue to monitor.
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
## 2023-01-06 NOTE — Telephone Encounter (Signed)
Lmtcb.

## 2023-01-06 NOTE — Progress Notes (Addendum)
Rounding Note    Patient Name: Mark Jackson. Date of Encounter: 01/06/2023  [facility] Cardiologist: Bryan Lemma, MD   Subjective   No complaints this morning. Family at bedside.  Just wants a plan  .  He did note that he felt woozy and lightheaded yesterday right about the time that would have coincided with his pause.  Inpatient Medications    Scheduled Meds:  ezetimibe  10 mg Oral Daily   FLUoxetine  10 mg Oral Daily   furosemide  40 mg Oral Daily   isosorbide mononitrate  60 mg Oral Daily   leptospermum manuka honey  1 Application Topical Daily   polyethylene glycol  17 g Oral Daily   rosuvastatin  10 mg Oral QHS   topiramate  100 mg Oral BID   Continuous Infusions:  PRN Meds:    Vital Signs    Vitals:   01/06/23 0545 01/06/23 0633 01/06/23 0636 01/06/23 0759  BP: (!) 174/80  (!) 185/77 (!) 178/87  Pulse: 68  67 77  Resp: 16 18 18 16   Temp: 97.6 F (36.4 C)  (!) 97.5 F (36.4 C) (!) 97.5 F (36.4 C)  TempSrc: Oral  Oral Oral  SpO2: 100%  99% 99%  Weight:   86.4 kg    No intake or output data in the 24 hours ending 01/06/23 1012    01/06/2023    6:36 AM 01/05/2023    4:10 PM 12/29/2022    1:52 PM  Last 3 Weights  Weight (lbs) 190 lb 7.6 oz 196 lb 194 lb 6.1 oz  Weight (kg) 86.4 kg 88.905 kg 88.17 kg      Telemetry    Sinus Rhythm, 1st degree AVB (prolonged first-degree AV block)- Personally Reviewed  ECG    Sinus Rhythm, 1st degree AVB LVH- Personally Reviewed  Physical Exam   GEN: No acute distress.   Neck: No JVD Cardiac: RRR, no murmurs, rubs, or gallops.  Respiratory: Clear to auscultation bilaterally.  Nonlabored, good air movement. GI: Soft, nontender, non-distended  MS: 2+ pedal edema bilaterally.  Stable.; No deformity. Neuro:  Nonfocal  Psych: Normal affect   Labs    High Sensitivity Troponin:   Recent Labs  Lab 12/23/22 1215 12/23/22 1450 12/24/22 0536 01/06/23 0205 01/06/23 0448  TROPONINIHS 214*  175* 147* 17 16     Chemistry Recent Labs  Lab 01/06/23 0205 01/06/23 0544  NA 140 141  K 4.7 4.6  CL 109 108  CO2 19* 20*  GLUCOSE 99 99  BUN 65* 66*  CREATININE 2.95* 2.92*  CALCIUM 8.7* 8.7*  GFRNONAA 20* 20*  ANIONGAP 12 13    Lipids No results for input(s): "CHOL", "TRIG", "HDL", "LABVLDL", "LDLCALC", "CHOLHDL" in the last 168 hours.  Hematology Recent Labs  Lab 01/06/23 0205 01/06/23 0544  WBC 11.0* 11.7*  RBC 3.18* 3.07*  HGB 10.6* 10.2*  HCT 33.0* 32.1*  MCV 103.8* 104.6*  MCH 33.3 33.2  MCHC 32.1 31.8  RDW 13.0 13.0  PLT 149* 148*   Thyroid No results for input(s): "TSH", "FREET4" in the last 168 hours.  BNPNo results for input(s): "BNP", "PROBNP" in the last 168 hours.  DDimer No results for input(s): "DDIMER" in the last 168 hours.   Radiology    DG Chest 2 View  Result Date: 01/06/2023 CLINICAL DATA:  Chest pain. EXAM: CHEST - 2 VIEW COMPARISON:  Dec 23, 2022 FINDINGS: The heart size and mediastinal contours are within normal limits. An  artificial aortic valve is seen. There is no evidence of acute infiltrate, pleural effusion or pneumothorax. Multiple chronic right-sided rib fractures are noted. Multilevel degenerative changes are seen throughout the thoracic spine. IMPRESSION: No active cardiopulmonary disease. Electronically Signed   By: Aram Candela M.D.   On: 01/06/2023 02:28    Cardiac Studies   Echo 12/24/2022: Normal LV size and function.  EF 55 to 60%.  GR 1 DD.  No RWMA.  26 mm TAVR valve well-seated.  Mean gradient 14 (had been 10).  Normal RV and IVC.  Normal mitral valve.    Patient Profile     89 y.o. male  with HTN, CAD s/p PCI, HTN, CKD, pAF on eliquis and diltiazem, AS s/p TAVR, migraine, and recurrent syncope who is being seen 01/06/2023 for the evaluation of presyncope in the setting of sinus arrest.   Assessment & Plan    Sinus Pauses Recurrent syncope 1st degree AVB -- initially monitor placed in the setting of recurrent  syncopal episodes. Last evening on-call provider received an alert of a 6.2 sec pause and patient reported episode of light-headedness around that same time. Presented to the ED for further evaluation -- on Diltiazem 120mg  PTA ( this has been reduced from 240mg  from a recent admission), has not been able to tolerate higher doses in the past 2/2 bradycardia) last dose was 10:30am 5/28 -- no further pauses noted on telemetry -- will ask EP to evaluate given concern for tachy/brady syndrome -> currently waiting for diltiazem washout, however I think that diltiazem is a medication being used for both rate control and for antianginal benefit and therefore would be a medication that he needs to be on going forward.  That should be taken into consideration when determining plus or minus PPM especially given very long PRN.   Paroxsymal afib -- remains in sinus rhythm -- Eliquis held pending EP consult -- of note, seen in the office on 5/28 for consideration of Watchman, deemed not a candidate  HFpEF -- does not appear significantly volume overloaded on exam, some LE bilateral edema -- continue lasix 40mg  PO daily   AS s/p TAVR -- echo 5/16 with Aortic valve area, by VTI measures 1.85 cm. Aortic valve mean gradient measures 14.0 mmHg. Aortic valve Vmax measures 2.57 m/s. DVI 0.59   HLD -- on statin   CAD-PCI.  No active angina.  Continue Imdur.  Had also been on diltiazem for antianginal benefit.  Currently on hold. - CKD 3B-clinically stable.   For questions or updates, please contact [facility] Please consult [email or web address] for contact info under        Signed, Laverda Page, NP  01/06/2023, 10:12 AM     ATTENDING ATTESTATION  I have seen, examined and evaluated the patient this AM on rounds along with Laverda Page, NP-C.   After reviewing all the available data and chart, we discussed the patients laboratory, study & physical findings as well as symptoms in detail.  I  agree with her findings, examination as well as impression recommendations as per our discussion.    Attending adjustments noted in italics.   Patient with longstanding CAD and A-fib.  Has been on diltiazem because he had fatigue with beta-blocker.  We have had to reduce his dose of diltiazem from 240 down to 120.  He failed attempted to be increasing to 240 recently.  Please drop back down to 120 mg daily during last hospitalization which was for syncope.   Initially, was  thought that his syncope was related to his aortic stenosis, but he has continued to have the symptoms.  There is lightheaded near drop attacks have been going on for several years which predated his TAVR.  He has been intolerant of just but every rate control agent discussion and very low-dose diltiazem.  This was chosen to also provide antianginal benefit for his existing CAD.  He now presents with prolonged sinus arrest (6.4 seconds) on monitor after recent evaluation for syncope. Was contacted by the on-call cardiology fellow and told to come in for evaluation.  Diltiazem is now on hold.  We are consulting EP to consider the possibility of placing a PPM.  I do think that he eventually will be someone who would need pacemaker and I would prefer to do it before he has another fall with potential fracture.  He is on oral Lasix with stable edema, will put him on SCDs which helped his swelling last visit. Not having any active anginal symptoms  At this point we are simply waiting for EP to opine.  As his primary cardiologist, I strongly feel that he would benefit from PPM placement-I think micra be a great idea.    Marykay Lex, MD, MS Bryan Lemma, M.D., M.S. Interventional Cardiologist  [facility]  Pager # [phone] Phone # [phone] [address] [place], [place] [address]

## 2023-01-06 NOTE — Patient Outreach (Signed)
  Care Coordination   Follow Up Visit Note   01/06/2023 Name: Bain Lewi. MRN: [id number] DOB: 1930-07-19  [name] is a 89 y.o. year old male who sees Hunter, Aldine Contes, MD for primary care. I  spoke with daughter Helmut Muster inquiring about home health.    What matters to the patients health and wellness today?  Patient currently hospitalized.     Goals Addressed             This Visit's Progress    A,Fib managment       Care Coordination Interventions: Counseled on increased risk of stroke due to Afib and benefits of anticoagulation for stroke prevention Reviewed importance of adherence to anticoagulant exactly as prescribed  Patient doing well. Pending consult for watchman device.  Weight 186 lbs.  01/06/23 Call from daughter asking about home health.  Patient in hospital presently after abnormal heart rhythm.  Advised that home health company is [place].  Contact information provided.          SDOH assessments and interventions completed:  Yes     Care Coordination Interventions:  Yes, provided   Follow up plan:  as previously scheduled.      Encounter Outcome:  Pt. Visit Completed   Bary Leriche, RN, MSN [facility] Care Management Care Management Coordinator Direct Line [phone]

## 2023-01-07 ENCOUNTER — Ambulatory Visit (HOSPITAL_COMMUNITY): Admit: 2023-01-07 | Payer: BLUE CROSS/BLUE SHIELD | Admitting: Cardiology

## 2023-01-07 ENCOUNTER — Encounter (HOSPITAL_COMMUNITY)
Admission: EM | Disposition: A | Discharge status: Home / Self Care | Payer: Self-pay | Source: Home / Self Care | Attending: Emergency Medicine

## 2023-01-07 ENCOUNTER — Telehealth: Payer: Self-pay | Admitting: Family Medicine

## 2023-01-07 DIAGNOSIS — I10 Essential (primary) hypertension: Secondary | ICD-10-CM | POA: Diagnosis not present

## 2023-01-07 DIAGNOSIS — I480 Paroxysmal atrial fibrillation: Secondary | ICD-10-CM | POA: Diagnosis not present

## 2023-01-07 DIAGNOSIS — I2510 Atherosclerotic heart disease of native coronary artery without angina pectoris: Secondary | ICD-10-CM | POA: Diagnosis not present

## 2023-01-07 DIAGNOSIS — R55 Syncope and collapse: Secondary | ICD-10-CM | POA: Diagnosis not present

## 2023-01-07 DIAGNOSIS — I455 Other specified heart block: Secondary | ICD-10-CM | POA: Diagnosis present

## 2023-01-07 HISTORY — PX: LOOP RECORDER INSERTION: EP1214

## 2023-01-07 LAB — CBC
HCT: 30.6 % — ABNORMAL LOW (ref 39.0–52.0)
Hemoglobin: 10 g/dL — ABNORMAL LOW (ref 13.0–17.0)
MCH: 33.4 pg (ref 26.0–34.0)
MCHC: 32.7 g/dL (ref 30.0–36.0)
MCV: 102.3 fL — ABNORMAL HIGH (ref 80.0–100.0)
Platelets: 134 10*3/uL — ABNORMAL LOW (ref 150–400)
RBC: 2.99 MIL/uL — ABNORMAL LOW (ref 4.22–5.81)
RDW: 13 % (ref 11.5–15.5)
WBC: 11.5 10*3/uL — ABNORMAL HIGH (ref 4.0–10.5)
nRBC: 0 % (ref 0.0–0.2)

## 2023-01-07 LAB — BASIC METABOLIC PANEL
Anion gap: 8 (ref 5–15)
BUN: 65 mg/dL — ABNORMAL HIGH (ref 8–23)
CO2: 20 mmol/L — ABNORMAL LOW (ref 22–32)
Calcium: 8.2 mg/dL — ABNORMAL LOW (ref 8.9–10.3)
Chloride: 113 mmol/L — ABNORMAL HIGH (ref 98–111)
Creatinine, Ser: 2.73 mg/dL — ABNORMAL HIGH (ref 0.61–1.24)
GFR, Estimated: 21 mL/min — ABNORMAL LOW (ref >60)
Glucose, Bld: 94 mg/dL (ref 70–99)
Potassium: 4.3 mmol/L (ref 3.5–5.1)
Sodium: 141 mmol/L (ref 135–145)

## 2023-01-07 SURGERY — LOOP RECORDER INSERTION

## 2023-01-07 MED ORDER — APIXABAN 2.5 MG PO TABS
2.5000 mg | ORAL_TABLET | Freq: Two times a day (BID) | ORAL | Status: DC
  Administered 2023-01-07: 2.5 mg via ORAL
  Filled 2023-01-07: qty 1

## 2023-01-07 MED ORDER — LIDOCAINE-EPINEPHRINE 1 %-1:100000 IJ SOLN
INTRAMUSCULAR | Status: AC
  Filled 2023-01-07: qty 1

## 2023-01-07 MED ORDER — AMLODIPINE BESYLATE 5 MG PO TABS: 2.5000 mg | ORAL_TABLET | Freq: Every day | ORAL | 11 refills | Status: DC

## 2023-01-07 MED ORDER — LIDOCAINE-EPINEPHRINE 1 %-1:100000 IJ SOLN
INTRAMUSCULAR | Status: DC | PRN
  Administered 2023-01-07: 10 mL

## 2023-01-07 SURGICAL SUPPLY — 2 items
PACK LOOP INSERTION (CUSTOM PROCEDURE TRAY) ×1 IMPLANT
SYSTEM MONITOR REVEAL LINQ II (Prosthesis & Implant Heart) IMPLANT

## 2023-01-07 NOTE — TOC Progression Note (Signed)
Transition of Care (TOC) - Progression Note    Patient Details  Name: Mark Jackson. MRN: [id number] Date of Birth: 1930/01/29  Transition of Care [facility]) CM/SW Contact  Lawerance Sabal, RN Phone Number: 01/07/2023, 1:21 PM  Clinical Narrative:     Spoke w patient and family at bedside. Reviewed DC plan to continue w [facility]. [facility] aware patient will possibly DC today after loop recorder done.  Family will transport home.   Expected Discharge Plan: Home w Home Health Services Barriers to Discharge: Continued Medical Work up  Expected Discharge Plan and Services   Discharge Planning Services: CM Consult Post Acute Care Choice: Home Health Living arrangements for the past 2 months: Single Family Home                           HH Arranged: RN, PT [facility] Agency: [facility] Date [facility] Agency Contacted: 01/06/23 Time HH Agency Contacted: 1455 Representative spoke with at [facility] Agency: Kandee Keen   Social Determinants of Health (SDOH) Interventions SDOH Screenings   Food Insecurity: No Food Insecurity (01/05/2023)  Housing: Low Risk  (01/05/2023)  Transportation Needs: No Transportation Needs (12/28/2022)  Utilities: Not At Risk (12/24/2022)  Depression (PHQ2-9): Medium Risk (12/29/2022)  Financial Resource Strain: Low Risk  (11/26/2022)  Physical Activity: Sufficiently Active (11/26/2022)  Social Connections: Moderately Integrated (11/26/2022)  Stress: No Stress Concern Present (11/26/2022)  Tobacco Use: Medium Risk (01/05/2023)    Readmission Risk Interventions     No data to display

## 2023-01-07 NOTE — Consult Note (Signed)
   [facility] CM Inpatient Consult   01/07/2023  Danielle Lara 08/04/1930 [id number]  [facility] [[facility]]  Accountable Care Organization [ACO] Patient: Medicare ACO REACH  Primary Care Provider:  Shelva Majestic, MD [facility] is listed to provide the Transition of  Care follow up   *Patient with 2 hospitalizations in the past 6 months, was in observation status noted.  Patient is currently active with [facility] [[facility] Care Management for chronic disease management services.  Patient has been engaged by a [facility] RN CM.  Our community based plan of care has focused on disease management and community resource support.     Plan: [facility] RN CC is aware of hospitalization and procedure for post hospital follow up noted.  Of note, [facility] Care Management services does not replace or interfere with any services that are needed or arranged by inpatient [facility] care management team.   For additional questions or referrals please contact:  Charlesetta Shanks, RN BSN CCM [facility]  [phone] business mobile phone Toll free office [phone]  *Concierge Line  [phone] Fax number: [phone] [email or web address]

## 2023-01-07 NOTE — Discharge Instructions (Signed)
Post procedure wound care instructions (heart monitor) Keep incision clean and dry (no showers) for 3 days. You can remove outer dressing tomorrow. Leave steri-strips (little pieces of tape) on until they fall off or 7 days Call the office [phone]) for redness, drainage, swelling, or fever.

## 2023-01-07 NOTE — Progress Notes (Signed)
Patient had an episode of light headedness, EKG done and MD on call notified. This RN was advised to continue monitoring patient because it has not been no change from the previous EKG.

## 2023-01-07 NOTE — Progress Notes (Addendum)
Rounding Note    Patient Name: Mark Jackson. Date of Encounter: 01/07/2023  [facility] Cardiologist: Bryan Lemma, MD   Subjective   Episode last night was same as some of his home symptoms, none again, no CP, SOB  Inpatient Medications    Scheduled Meds:  ezetimibe  10 mg Oral Daily   FLUoxetine  10 mg Oral Daily   furosemide  40 mg Oral Daily   isosorbide mononitrate  60 mg Oral Daily   leptospermum manuka honey  1 Application Topical Daily   polyethylene glycol  17 g Oral Daily   rosuvastatin  10 mg Oral QHS   topiramate  100 mg Oral BID   Continuous Infusions:  PRN Meds: acetaminophen   Vital Signs    Vitals:   01/06/23 2132 01/06/23 2339 01/07/23 0321 01/07/23 0811  BP: 138/72 (!) 176/92 (!) 154/86 (!) 176/86  Pulse: 80 76 71 73  Resp: 15 16 15 18   Temp: 98.9 F (37.2 C) 98.7 F (37.1 C) 98.4 F (36.9 C) 98 F (36.7 C)  TempSrc: Oral Oral Oral Oral  SpO2: 99% 99% 96% 98%  Weight:       No intake or output data in the 24 hours ending 01/07/23 0853    01/06/2023    6:36 AM 01/05/2023    4:10 PM 12/29/2022    1:52 PM  Last 3 Weights  Weight (lbs) 190 lb 7.6 oz 196 lb 194 lb 6.1 oz  Weight (kg) 86.4 kg 88.905 kg 88.17 kg      Telemetry    SR, 1st degree AVblock  - Personally Reviewed  ECG    No new EKGs - Personally Reviewed  Physical Exam   GEN: No acute distress.   Neck: No JVD Cardiac: RRR, no murmurs, rubs, or gallops.  Respiratory: CTA b/l GI: Soft, nontender, non-distended  MS: No edema; No deformity. Neuro:  Nonfocal  Psych: Normal affect   Labs    High Sensitivity Troponin:   Recent Labs  Lab 12/23/22 1215 12/23/22 1450 12/24/22 0536 01/06/23 0205 01/06/23 0448  TROPONINIHS 214* 175* 147* 17 16     Chemistry Recent Labs  Lab 01/06/23 0205 01/06/23 0544 01/07/23 0234  NA 140 141 141  K 4.7 4.6 4.3  CL 109 108 113*  CO2 19* 20* 20*  GLUCOSE 99 99 94  BUN 65* 66* 65*  CREATININE 2.95* 2.92*  2.73*  CALCIUM 8.7* 8.7* 8.2*  GFRNONAA 20* 20* 21*  ANIONGAP 12 13 8     Lipids No results for input(s): "CHOL", "TRIG", "HDL", "LABVLDL", "LDLCALC", "CHOLHDL" in the last 168 hours.  Hematology Recent Labs  Lab 01/06/23 0205 01/06/23 0544 01/07/23 0234  WBC 11.0* 11.7* 11.5*  RBC 3.18* 3.07* 2.99*  HGB 10.6* 10.2* 10.0*  HCT 33.0* 32.1* 30.6*  MCV 103.8* 104.6* 102.3*  MCH 33.3 33.2 33.4  MCHC 32.1 31.8 32.7  RDW 13.0 13.0 13.0  PLT 149* 148* 134*   Thyroid No results for input(s): "TSH", "FREET4" in the last 168 hours.  BNPNo results for input(s): "BNP", "PROBNP" in the last 168 hours.  DDimer No results for input(s): "DDIMER" in the last 168 hours.   Radiology    DG Chest 2 View Result Date: 01/06/2023 CLINICAL DATA:  Chest pain. EXAM: CHEST - 2 VIEW COMPARISON:  Dec 23, 2022 FINDINGS: The heart size and mediastinal contours are within normal limits. An artificial aortic valve is seen. There is no evidence of acute infiltrate, pleural effusion  or pneumothorax. Multiple chronic right-sided rib fractures are noted. Multilevel degenerative changes are seen throughout the thoracic spine. IMPRESSION: No active cardiopulmonary disease. Electronically Signed   By: Aram Candela M.D.   On: 01/06/2023 02:28    Cardiac Studies   ECHO 12/24/22 > LVEF 55-60%, no RWMA, GIDD, RVSP normal, trivial MR, prosthetic AV Carotid US 5/16 > R ICA with 80-99% stenosis, ECA >50% stenosed, L ICA with 1-39% stenosis  Patient Profile     89 y.o. male CAD s/p NSTEMI with PCI/DES to LAD, Chronic dCHF, HTN, HLD, moderate AS s/p TAVR, PAF on chronic anticoagulation (eliquis), CKD 4, obesity, & anemia admitted after a monitor alert for 6.4 second pause that he was wearing for syncope/falls  Assessment & Plan    Hx of syncope/falls Events are not all the same, pt certain some are "just falls", though no clear mechanisms for them  Paroxysmal AFib CHA2DS2Vasc is 5, on Eliquis, appropriate dose 2.5  for age/CKD IV) Last noted arrhythmia was back in 2016/A flutter, not fast  Conduction system disease Progressive 1st degree AVblock >> 400 on dilt   After lengthy discussion yesterday with Dr. Graciela Husbands Plan to stop his diltiazem, if he does have recurrent AFib/flutter low likely hood of RVR given AV conduction loop implant to better clarify his symptoms, falls  He reported a episode of lightheadedness (RN note at 7:45pm) telemetry was SR, 1st degree AVblock, rates were OK, no brady, no tachy  OK to discharge after loop implant from EP perspective No need to hold [facility] Wound care discussed with the patient/instructions are in his AVS    For questions or updates, please contact [facility] Please consult [email or web address] for contact info under        Signed, Sheilah Pigeon, PA-C  01/07/2023, 8:53 AM    I have seen and examined this patient with Mark Jackson.  Agree with above, note added to reflect my findings.  No further episodes of syncope.    GEN: Well nourished, well developed, in no acute distress  HEENT: normal  Neck: no JVD, carotid bruits, or masses Cardiac: RRR; no murmurs, rubs, or gallops,no edema  Respiratory:  clear to auscultation bilaterally, normal work of breathing GI: soft, nontender, nondistended, + BS MS: no deformity or atrophy  Skin: warm and dry Neuro:  Strength and sensation are intact Psych: euthymic mood, full affect   Syncope: Cardiac monitor with prolonged pauses.  Diltiazem has been stopped.  He remains on telemetry without any concerning pauses.  Mark Jackson plan for ILR implant today.  Risk and benefits were discussed.  Risk of bleeding, and infection.  He understands the risks and is agreed to the procedure.  Mark Jackson M. Kinesha Auten MD 01/07/2023 7:05 PM

## 2023-01-07 NOTE — Care Management Obs Status (Signed)
MEDICARE OBSERVATION STATUS NOTIFICATION   Patient Details  Name: Mark Jackson. MRN: [id number] Date of Birth: [birth year]   Medicare Observation Status Notification Given:  Yes    Lawerance Sabal, RN 01/07/2023, 2:06 PM

## 2023-01-07 NOTE — Discharge Summary (Signed)
Discharge Summary    Patient ID: Mark Jackson. MRN: [id number]; DOB: October 16, 1929  Admit date: 01/06/2023 Discharge date: 01/07/2023  PCP:  Shelva Majestic, MD   [facility] Providers Cardiologist:  Bryan Lemma, MD     Discharge Diagnoses    Principal Problem:   Sinus arrest Active Problems:   Hyperlipidemia with target LDL less than 70   PAF (paroxysmal atrial fibrillation) (HCC)   Syncope   Sinus pause  Diagnostic Studies/Procedures    N/a  _____________   History of Present Illness     Mark Jackson. is a 89 y.o. male with HTN, CAD s/p PCI, HTN, CKD, pAF on eliquis and diltiazem, AS s/p TAVR, migraine, and recurrent syncope who was seen 01/06/2023 for the evaluation of presyncope in the setting of sinus arrest.   Has been undergoing workup for recurrent syncope (notes that he has had 3 syncopal events in the last 3 months, most recent 12/23/22). Seen by cardiology who recommended discharge with a ziopatch, and empiric reduction of his home diltiazem.    Received call from [employer] about critical heart rhythm; they noted that at 10:45pm on 01/05/23, patient had a sinus pause for 6.4 seconds with no ventricular escape. After calling the patient, he notes that around that time he was awake and walking around at home, suddenly felt lightheaded and presyncopal, but no syncope. 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 He noted that he took his diltiazem that morning and his last dose of eliquis tonight.   Denies CP, SOB, orthopnea, PND, palpitations, syncope, or recent falls since his last hospitalization.  Hospital Course     Consultants: EP   Sinus Pauses Recurrent syncope 1st degree AVB -- initially monitor placed in the setting of recurrent syncopal episodes. Last evening on-call provider received an alert of a 6.2 sec pause and patient reported episode of light-headedness around that same  time. Presented to the ED for further evaluation -- on Diltiazem 120mg  PTA ( this has been reduced from 240mg  from a recent admission), has not been able to tolerate higher doses in the past 2/2 bradycardia) last dose was 10:30am 5/28 -- no further pauses noted on telemetry. Did have episode of lightheadedness while on telemetry with no notable arrhythmias -- evaluated by EP, decision made to proceed with loop recorder which was placed prior to discharge    Paroxsymal afib -- remains in sinus rhythm -- continue Eliquis 2.5mg  BID -- of note, seen in the office on 5/28 for consideration of Watchman, deemed not a candidate  HTN -- as above, diltiazem has been stopped -- add amlodipine 2.5mg  daily    HFpEF -- does not appear significantly volume overloaded on exam, some LE bilateral edema -- continue lasix 40mg  PO daily    AS s/p TAVR -- echo 5/16 with Aortic valve area, by VTI measures 1.85 cm. Aortic valve mean gradient measures 14.0 mmHg. Aortic valve Vmax measures 2.57 m/s. DVI 0.59    HLD -- on statin    CAD s/p PCI --  No active angina.  Continue Imdur  CKD 3B -- clinically stable  General: Well developed, well nourished, male appearing in no acute distress. Head: Normocephalic, atraumatic.  Neck: Supple without bruits, JVD. Lungs:  Resp regular and unlabored, CTA. Heart: RRR, S1, S2, no S3, S4, or murmur; no rub. Abdomen: Soft, non-tender, non-distended with normoactive bowel sounds. No hepatomegaly. No  rebound/guarding. No obvious abdominal masses. Extremities: No clubbing, cyanosis, edema. Distal pedal pulses are 2+ bilaterally.  Neuro: Alert and oriented X 3. Moves all extremities spontaneously. Psych: Normal affect.  Patient was seen by Dr. Herbie Baltimore and deemed stable for discharge home. Follow up arranged in the office.   _____________  Discharge Vitals Blood pressure (!) 157/70, pulse 73, temperature 98 F (36.7 C), temperature source Oral, resp. rate 19, weight  86.4 kg, SpO2 98 %.  Filed Weights   01/06/23 0636  Weight: 86.4 kg    Labs & Radiologic Studies    CBC Recent Labs    01/06/23 0544 01/07/23 0234  WBC 11.7* 11.5*  HGB 10.2* 10.0*  HCT 32.1* 30.6*  MCV 104.6* 102.3*  PLT 148* 134*   Basic Metabolic Panel Recent Labs    16/10/96 0544 01/07/23 0234  NA 141 141  K 4.6 4.3  CL 108 113*  CO2 20* 20*  GLUCOSE 99 94  BUN 66* 65*  CREATININE 2.92* 2.73*  CALCIUM 8.7* 8.2*   Liver Function Tests No results for input(s): "AST", "ALT", "ALKPHOS", "BILITOT", "PROT", "ALBUMIN" in the last 72 hours. No results for input(s): "LIPASE", "AMYLASE" in the last 72 hours. High Sensitivity Troponin:   Recent Labs  Lab 12/23/22 1215 12/23/22 1450 12/24/22 0536 01/06/23 0205 01/06/23 0448  TROPONINIHS 214* 175* 147* 17 16    BNP Invalid input(s): "POCBNP" D-Dimer No results for input(s): "DDIMER" in the last 72 hours. Hemoglobin A1C No results for input(s): "HGBA1C" in the last 72 hours. Fasting Lipid Panel No results for input(s): "CHOL", "HDL", "LDLCALC", "TRIG", "CHOLHDL", "LDLDIRECT" in the last 72 hours. Thyroid Function Tests No results for input(s): "TSH", "T4TOTAL", "T3FREE", "THYROIDAB" in the last 72 hours.  Invalid input(s): "FREET3" _____________  EP PPM/ICD IMPLANT  Result Date: 01/07/2023 SURGEON:  Will Jorja Loa, MD   PREPROCEDURE DIAGNOSIS:  Syncope   POSTPROCEDURE DIAGNOSIS: Syncope    PROCEDURES:  1. Implantable loop recorder implantation   INTRODUCTION:  Mark Centola. presents with a history of syncope The costs of loop recorder monitoring have been discussed with the patient. Appropriate time out was performed prior to the procedure.   DESCRIPTION OF PROCEDURE:  Informed written consent was obtained.  The patient required no sedation for the procedure today.  Mapping over the patient's chest was performed to identify the area where electrograms were most prominent for ILR recording.  This area was  found to be the left parasternal region over the 4th intercostal space. The patients left chest was therefore prepped and draped in the usual sterile fashion. The skin overlying the left parasternal region was infiltrated with lidocaine for local analgesia.  A 0.5-cm incision was made over the left parasternal region over the 3rd intercostal space.  A subcutaneous ILR pocket was fashioned using a combination of sharp and blunt dissection.  A [employer] Reveal LINQ (serial # [id number]) implantable loop recorder was then placed into the pocket  R waves were very prominent and measured 0.40mV.  Steri- Strips and a sterile dressing were then applied.  There were no early apparent complications.   CONCLUSIONS:  1. Successful implantation of a implantable loop recorder for a history of syncope  2. No early apparent complications. Will Jorja Loa, MD 01/07/2023 12:21 PM   DG Chest 2 View  Result Date: 01/06/2023 CLINICAL DATA:  Chest pain. EXAM: CHEST - 2 VIEW COMPARISON:  Dec 23, 2022 FINDINGS: The heart size and mediastinal contours are within normal limits. An artificial  aortic valve is seen. There is no evidence of acute infiltrate, pleural effusion or pneumothorax. Multiple chronic right-sided rib fractures are noted. Multilevel degenerative changes are seen throughout the thoracic spine. IMPRESSION: No active cardiopulmonary disease. Electronically Signed   By: Aram Candela M.D.   On: 01/06/2023 02:28   MR BRAIN WO CONTRAST  Result Date: 12/25/2022 CLINICAL DATA:  Transient ischemic attack (TIA) EXAM: MRI HEAD WITHOUT CONTRAST TECHNIQUE: Multiplanar, multiecho pulse sequences of the brain and surrounding structures were obtained without intravenous contrast. COMPARISON:  MR Head 12/26/21, CT head 12/23/22 FINDINGS: Brain: Negative for an acute infarct. Arachnoid cyst in the left middle cranial fossa. Sequela of moderate chronic microvascular ischemic change. No hydrocephalus. No hemorrhage. No  extra-axial fluid collection. Chronic bilateral cerebellar infarcts. Vascular: Normal flow voids. Skull and upper cervical spine: Normal marrow signal. Sinuses/Orbits: No middle ear or mastoid effusion. Mucosal thickening bilateral ethmoid sinuses. Bilateral lens replacement. Orbits are otherwise unremarkable. Other: None. IMPRESSION: No acute intracranial process. Electronically Signed   By: Lorenza Cambridge M.D.   On: 12/25/2022 15:24   VAS US CAROTID  Result Date: 12/24/2022 Carotid Arterial Duplex Study Patient Name:  Mark Hangartner.  Date of Exam:   12/24/2022 Medical Rec #: [id number]           Accession #:    [id number] Date of Birth: [birth year]          Patient Gender: M Patient Age:   69 years Exam Location:  [facility] Procedure:      VAS US CAROTID Referring Phys: Stephania Fragmin --------------------------------------------------------------------------------  Indications:       Syncope. Risk Factors:      Hypertension, hyperlipidemia, past history of smoking,                    coronary artery disease. Other Factors:     Afib, CKD, CHF, TAVR. Comparison Study:  Previous exam on 12/27/21 RT 60-79% LT 1-39% Performing Technologist: Jody Hill RVT, RDMS  Examination Guidelines: A complete evaluation includes B-mode imaging, spectral Doppler, color Doppler, and power Doppler as needed of all accessible portions of each vessel. Bilateral testing is considered an integral part of a complete examination. Limited examinations for reoccurring indications may be performed as noted.  Right Carotid Findings: +----------+--------+--------+--------+----------------------+--------+           PSV cm/sEDV cm/sStenosisPlaque Description    Comments +----------+--------+--------+--------+----------------------+--------+ CCA Prox  61      9                                              +----------+--------+--------+--------+----------------------+--------+ CCA Distal70      17                                              +----------+--------+--------+--------+----------------------+--------+ ICA Prox  329     82      80-99%  calcific and irregular         +----------+--------+--------+--------+----------------------+--------+ ICA Mid   74      19                                             +----------+--------+--------+--------+----------------------+--------+  ICA Distal55      16                                             +----------+--------+--------+--------+----------------------+--------+ ECA       240     0       >50%                                   +----------+--------+--------+--------+----------------------+--------+ +----------+--------+-------+----------------+-------------------+           PSV cm/sEDV cmsDescribe        Arm Pressure (mmHG) +----------+--------+-------+----------------+-------------------+ [id number]            Multiphasic, WNL                    +----------+--------+-------+----------------+-------------------+ +---------+--------+--+--------+--+---------+ VertebralPSV cm/s50EDV cm/s14Antegrade +---------+--------+--+--------+--+---------+  Left Carotid Findings: +----------+--------+--------+--------+---------------------+------------------+           PSV cm/sEDV cm/sStenosisPlaque Description   Comments           +----------+--------+--------+--------+---------------------+------------------+ CCA Prox  84      11                                   intimal thickening +----------+--------+--------+--------+---------------------+------------------+ CCA Distal83      24                                   intimal thickening +----------+--------+--------+--------+---------------------+------------------+ ICA Prox  103     32      1-39%   calcific and                                                              irregular                                +----------+--------+--------+--------+---------------------+------------------+ ICA Distal75      20                                                      +----------+--------+--------+--------+---------------------+------------------+ ECA       121     0               calcific and focal                      +----------+--------+--------+--------+---------------------+------------------+ +----------+--------+--------+----------------+-------------------+           PSV cm/sEDV cm/sDescribe        Arm Pressure (mmHG) +----------+--------+--------+----------------+-------------------+ [id number]             Multiphasic, WNL                    +----------+--------+--------+----------------+-------------------+ +---------+--------+--+--------+--+---------+ VertebralPSV cm/s52EDV cm/s13Antegrade +---------+--------+--+--------+--+---------+   Summary: Right Carotid: Velocities in the right ICA are  consistent with a 80-99%                stenosis. The ECA appears >50% stenosed. Left Carotid: Velocities in the left ICA are consistent with a 1-39% stenosis. Vertebrals:  Bilateral vertebral arteries demonstrate antegrade flow. Subclavians: Normal flow hemodynamics were seen in bilateral subclavian              arteries. *See table(s) above for measurements and observations.  Electronically signed by Heath Lark on 12/24/2022 at 4:50:50 PM.    Final    ECHOCARDIOGRAM COMPLETE  Result Date: 12/24/2022    ECHOCARDIOGRAM REPORT   Patient Name:   Mark Tavizon. Date of Exam: 12/24/2022 Medical Rec #:  [id number]          Height:       68.0 in Accession #:    [id number]         Weight:       187.5 lb Date of Birth:  24-Nov-1929         BSA:          1.988 m Patient Age:    89 years           BP:           157/68 mmHg Patient Gender: M                  HR:           62 bpm. Exam Location:  Inpatient Procedure: 2D Echo, Cardiac Doppler and Color Doppler Indications:    Elevated Troponin   History:        Patient has prior history of Echocardiogram examinations, most                 recent 12/27/2021. CHF, CAD, Signs/Symptoms:Syncope and Dyspnea;                 Risk Factors:Hypertension and Dyslipidemia. CKD, stage 4.                 Aortic Valve: 26 mm Edwards Sapien prosthetic, stented (TAVR)                 valve is present in the aortic position. Procedure Date: 12/12/19.  Sonographer:    Lucendia Herrlich Referring Phys: Emeline General IMPRESSIONS  1. Left ventricular ejection fraction, by estimation, is 55 to 60%. The left ventricle has normal function. The left ventricle has no regional wall motion abnormalities. Left ventricular diastolic parameters are consistent with Grade I diastolic dysfunction (impaired relaxation).  2. Right ventricular systolic function is normal. The right ventricular size is normal.  3. The mitral valve is degenerative. Trivial mitral valve regurgitation. No evidence of mitral stenosis.  4. The aortic valve has been repaired/replaced. Aortic valve regurgitation is not visualized. No aortic stenosis is present. There is a 26 mm Edwards Sapien prosthetic (TAVR) valve present in the aortic position. Procedure Date: 12/12/19. Aortic valve area, by VTI measures 1.85 cm. Aortic valve mean gradient measures 14.0 mmHg. Aortic valve Vmax measures 2.57 m/s. DVI 0.59.  5. The inferior vena cava is normal in size with greater than 50% respiratory variability, suggesting right atrial pressure of 3 mmHg  6. COmpared to study dated 12/27/21, the mean TAVR gradient has increased from 10 to , Vmax has increased from 2.13 to 2.20m/s and DVI has decreased from 0.74 to 0.59. FINDINGS  Left Ventricle: Left ventricular ejection fraction, by estimation, is 55 to 60%. The left  ventricle has normal function. The left ventricle has no regional wall motion abnormalities. The left ventricular internal cavity size was normal in size. There is  no left ventricular hypertrophy. Left ventricular  diastolic parameters are consistent with Grade I diastolic dysfunction (impaired relaxation). Normal left ventricular filling pressure. Right Ventricle: The right ventricular size is normal. No increase in right ventricular wall thickness. Right ventricular systolic function is normal. Left Atrium: Left atrial size was normal in size. Right Atrium: Right atrial size was normal in size. Pericardium: There is no evidence of pericardial effusion. Mitral Valve: The mitral valve is degenerative in appearance. There is moderate calcification of the anterior mitral valve leaflet(s). Mild mitral annular calcification. Trivial mitral valve regurgitation. No evidence of mitral valve stenosis. Tricuspid Valve: The tricuspid valve is normal in structure. Tricuspid valve regurgitation is trivial. No evidence of tricuspid stenosis. Aortic Valve: The aortic valve has been repaired/replaced. Aortic valve regurgitation is not visualized. No aortic stenosis is present. Aortic valve mean gradient measures 14.0 mmHg. Aortic valve peak gradient measures 26.5 mmHg. Aortic valve area, by VTI measures 1.85 cm. There is a 26 mm Edwards Sapien prosthetic, stented (TAVR) valve present in the aortic position. Procedure Date: 12/12/19. Pulmonic Valve: The pulmonic valve was normal in structure. Pulmonic valve regurgitation is not visualized. No evidence of pulmonic stenosis. Aorta: The aortic root is normal in size and structure. Venous: The inferior vena cava is normal in size with greater than 50% respiratory variability, suggesting right atrial pressure of 3 mmHg. IAS/Shunts: No atrial level shunt detected by color flow Doppler.  LEFT VENTRICLE PLAX 2D LVIDd:         4.00 cm   Diastology LVIDs:         2.80 cm   LV e' medial:    6.42 cm/s LV PW:         1.20 cm   LV E/e' medial:  12.4 LV IVS:        0.90 cm   LV e' lateral:   7.62 cm/s LVOT diam:     2.00 cm   LV E/e' lateral: 10.5 LV SV:         109 LV SV Index:   55 LVOT Area:     3.14 cm   RIGHT VENTRICLE RV S prime:     10.40 cm/s TAPSE (M-mode): 2.1 cm LEFT ATRIUM             Index        RIGHT ATRIUM           Index LA diam:        5.00 cm 2.51 cm/m   RA Area:     16.90 cm LA Vol (A2C):   77.8 ml 39.10 ml/m  RA Volume:   38.40 ml  19.31 ml/m LA Vol (A4C):   50.7 ml 25.50 ml/m LA Biplane Vol: 66.3 ml 33.34 ml/m  AORTIC VALVE AV Area (Vmax):    1.79 cm AV Area (Vmean):   1.84 cm AV Area (VTI):     1.85 cm AV Vmax:           257.33 cm/s AV Vmean:          169.500 cm/s AV VTI:            0.589 m AV Peak Grad:      26.5 mmHg AV Mean Grad:      14.0 mmHg LVOT Vmax:         146.80 cm/s LVOT  Vmean:        99.420 cm/s LVOT VTI:          0.348 m LVOT/AV VTI ratio: 0.59  AORTA Ao Root diam: 2.80 cm Ao Asc diam:  3.60 cm MITRAL VALVE                TRICUSPID VALVE MV Area (PHT): 5.02 cm     TR Peak grad:   21.3 mmHg MV Decel Time: 151 msec     TR Vmax:        231.00 cm/s MV E velocity: 79.90 cm/s MV A velocity: 142.00 cm/s  SHUNTS MV E/A ratio:  0.56         Systemic VTI:  0.35 m                             Systemic Diam: 2.00 cm Mark Magic MD Electronically signed by Mark Magic MD Signature Date/Time: 12/24/2022/1:51:04 PM    Final    CT Chest Wo Contrast  Result Date: 12/23/2022 CLINICAL DATA:  Chest trauma, blunt EXAM: CT CHEST WITHOUT CONTRAST TECHNIQUE: Multidetector CT imaging of the chest was performed following the standard protocol without IV contrast. RADIATION DOSE REDUCTION: This exam was performed according to the departmental dose-optimization program which includes automated exposure control, adjustment of the mA and/or kV according to patient size and/or use of iterative reconstruction technique. COMPARISON:  11/29/2019 CT, 12/23/2022 x-ray FINDINGS: Cardiovascular: Heart size is borderline enlarged. No pericardial effusion. Thoracic aorta is nonaneurysmal. Prior TAVR and coronary artery stent. Atherosclerotic calcifications of the aorta and coronary arteries. Central pulmonary  vasculature is nondilated. Mediastinum/Nodes: No enlarged mediastinal or axillary lymph nodes. Thyroid gland, trachea, and esophagus demonstrate no significant findings. Small hiatal hernia. Lungs/Pleura: Lungs are clear. No pleural effusion or pneumothorax. Upper Abdomen: No acute abnormality. Musculoskeletal: Chronic ununited fractures of the posterior right seventh through tenth ribs. Chronic well healed posterior right eleventh rib fracture. No acute rib fracture. Multilevel thoracic spondylosis. No chest wall hematoma. Bilateral gynecomastia. IMPRESSION: 1. No acute traumatic findings within the chest. 2. Multiple chronic right-sided rib fractures. 3. Small hiatal hernia. 4. Aortic and coronary artery atherosclerosis (ICD10-I70.0). Electronically Signed   By: Duanne Guess D.O.   On: 12/23/2022 15:46   CT Head Wo Contrast  Result Date: 12/23/2022 CLINICAL DATA:  Level 2 trauma EXAM: CT HEAD WITHOUT CONTRAST CT MAXILLOFACIAL WITHOUT CONTRAST CT CERVICAL SPINE WITHOUT CONTRAST TECHNIQUE: Multidetector CT imaging of the head, cervical spine, and maxillofacial structures were performed using the standard protocol without intravenous contrast. Multiplanar CT image reconstructions of the cervical spine and maxillofacial structures were also generated. RADIATION DOSE REDUCTION: This exam was performed according to the departmental dose-optimization program which includes automated exposure control, adjustment of the mA and/or kV according to patient size and/or use of iterative reconstruction technique. COMPARISON:  None Available. FINDINGS: CT HEAD FINDINGS Brain: No evidence of acute infarction, hemorrhage, extra-axial collection, ventriculomegaly, or mass effect. Generalized cerebral atrophy. Periventricular white matter low attenuation likely secondary to microangiopathy. Left middle cranial fossa arachnoid cyst. Vascular: Cerebrovascular atherosclerotic calcifications are noted. No hyperdense vessels.  Skull: Negative for fracture or focal lesion. Sinuses/Orbits: Visualized portions of the orbits are unremarkable. Visualized portions of the paranasal sinuses are unremarkable. Visualized portions of the mastoid air cells are unremarkable. Other: None. CT MAXILLOFACIAL FINDINGS Osseous: No fracture or mandibular dislocation. No destructive process. Orbits: Negative. No traumatic or inflammatory finding. Sinuses: Clear. Soft tissues: Right infraorbital  soft tissue swelling. CT CERVICAL SPINE FINDINGS Alignment: Normal. Skull base and vertebrae: No acute fracture. No primary bone lesion or focal pathologic process. Soft tissues and spinal canal: No prevertebral fluid or swelling. No visible canal hematoma. Disc levels: Degenerative disease with disc height loss at C3-4, C4-5, C5-6, C6-7 and C7-T1. Broad-based disc osteophyte complex and foraminal narrowing at C3-4. Broad-based disc osteophyte complex at C5-6, right uncovertebral degenerative changes and mild right foraminal narrowing. Upper chest: Lung apices are clear. Other: No fluid collection or hematoma. IMPRESSION: 1. No acute intracranial pathology. 2. No acute osseous injury of the facial bones. 3. No acute osseous injury of the cervical spine. 4. Cervical spine spondylosis as described above. Electronically Signed   By: Mark Jackson M.D.   On: 12/23/2022 13:31   CT Cervical Spine Wo Contrast  Result Date: 12/23/2022 CLINICAL DATA:  Level 2 trauma EXAM: CT HEAD WITHOUT CONTRAST CT MAXILLOFACIAL WITHOUT CONTRAST CT CERVICAL SPINE WITHOUT CONTRAST TECHNIQUE: Multidetector CT imaging of the head, cervical spine, and maxillofacial structures were performed using the standard protocol without intravenous contrast. Multiplanar CT image reconstructions of the cervical spine and maxillofacial structures were also generated. RADIATION DOSE REDUCTION: This exam was performed according to the departmental dose-optimization program which includes automated exposure  control, adjustment of the mA and/or kV according to patient size and/or use of iterative reconstruction technique. COMPARISON:  None Available. FINDINGS: CT HEAD FINDINGS Brain: No evidence of acute infarction, hemorrhage, extra-axial collection, ventriculomegaly, or mass effect. Generalized cerebral atrophy. Periventricular white matter low attenuation likely secondary to microangiopathy. Left middle cranial fossa arachnoid cyst. Vascular: Cerebrovascular atherosclerotic calcifications are noted. No hyperdense vessels. Skull: Negative for fracture or focal lesion. Sinuses/Orbits: Visualized portions of the orbits are unremarkable. Visualized portions of the paranasal sinuses are unremarkable. Visualized portions of the mastoid air cells are unremarkable. Other: None. CT MAXILLOFACIAL FINDINGS Osseous: No fracture or mandibular dislocation. No destructive process. Orbits: Negative. No traumatic or inflammatory finding. Sinuses: Clear. Soft tissues: Right infraorbital soft tissue swelling. CT CERVICAL SPINE FINDINGS Alignment: Normal. Skull base and vertebrae: No acute fracture. No primary bone lesion or focal pathologic process. Soft tissues and spinal canal: No prevertebral fluid or swelling. No visible canal hematoma. Disc levels: Degenerative disease with disc height loss at C3-4, C4-5, C5-6, C6-7 and C7-T1. Broad-based disc osteophyte complex and foraminal narrowing at C3-4. Broad-based disc osteophyte complex at C5-6, right uncovertebral degenerative changes and mild right foraminal narrowing. Upper chest: Lung apices are clear. Other: No fluid collection or hematoma. IMPRESSION: 1. No acute intracranial pathology. 2. No acute osseous injury of the facial bones. 3. No acute osseous injury of the cervical spine. 4. Cervical spine spondylosis as described above. Electronically Signed   By: Mark Jackson M.D.   On: 12/23/2022 13:31   CT Maxillofacial Wo Contrast  Result Date: 12/23/2022 CLINICAL DATA:  Level 2  trauma EXAM: CT HEAD WITHOUT CONTRAST CT MAXILLOFACIAL WITHOUT CONTRAST CT CERVICAL SPINE WITHOUT CONTRAST TECHNIQUE: Multidetector CT imaging of the head, cervical spine, and maxillofacial structures were performed using the standard protocol without intravenous contrast. Multiplanar CT image reconstructions of the cervical spine and maxillofacial structures were also generated. RADIATION DOSE REDUCTION: This exam was performed according to the departmental dose-optimization program which includes automated exposure control, adjustment of the mA and/or kV according to patient size and/or use of iterative reconstruction technique. COMPARISON:  None Available. FINDINGS: CT HEAD FINDINGS Brain: No evidence of acute infarction, hemorrhage, extra-axial collection, ventriculomegaly, or mass effect. Generalized cerebral atrophy.  Periventricular white matter low attenuation likely secondary to microangiopathy. Left middle cranial fossa arachnoid cyst. Vascular: Cerebrovascular atherosclerotic calcifications are noted. No hyperdense vessels. Skull: Negative for fracture or focal lesion. Sinuses/Orbits: Visualized portions of the orbits are unremarkable. Visualized portions of the paranasal sinuses are unremarkable. Visualized portions of the mastoid air cells are unremarkable. Other: None. CT MAXILLOFACIAL FINDINGS Osseous: No fracture or mandibular dislocation. No destructive process. Orbits: Negative. No traumatic or inflammatory finding. Sinuses: Clear. Soft tissues: Right infraorbital soft tissue swelling. CT CERVICAL SPINE FINDINGS Alignment: Normal. Skull base and vertebrae: No acute fracture. No primary bone lesion or focal pathologic process. Soft tissues and spinal canal: No prevertebral fluid or swelling. No visible canal hematoma. Disc levels: Degenerative disease with disc height loss at C3-4, C4-5, C5-6, C6-7 and C7-T1. Broad-based disc osteophyte complex and foraminal narrowing at C3-4. Broad-based disc  osteophyte complex at C5-6, right uncovertebral degenerative changes and mild right foraminal narrowing. Upper chest: Lung apices are clear. Other: No fluid collection or hematoma. IMPRESSION: 1. No acute intracranial pathology. 2. No acute osseous injury of the facial bones. 3. No acute osseous injury of the cervical spine. 4. Cervical spine spondylosis as described above. Electronically Signed   By: Mark Jackson M.D.   On: 12/23/2022 13:31   DG Chest Portable 1 View  Result Date: 12/23/2022 CLINICAL DATA:  Chest pain EXAM: PORTABLE CHEST 1 VIEW COMPARISON:  06/14/2022 FINDINGS: No focal consolidation. No pleural effusion or pneumothorax. Heart and mediastinal contours are unremarkable. Prior TAVR. No acute osseous abnormality. IMPRESSION: No active disease. Electronically Signed   By: Mark Jackson M.D.   On: 12/23/2022 12:56   DG Pelvis Portable  Result Date: 12/23/2022 CLINICAL DATA:  Chest pain, trauma, fall. EXAM: PORTABLE PELVIS 1-2 VIEWS COMPARISON:  04/28/2009. FINDINGS: Hip joint space is uniform bilaterally. No acute osseous abnormality. Brachytherapy seeds in the prostate. IMPRESSION: No acute findings. Electronically Signed   By: Leanna Battles M.D.   On: 12/23/2022 12:55   Disposition   Pt is being discharged home today in good condition.  Follow-up Plans & Appointments     Follow-up Information     [facility] Follow up.   Specialty: Home Health Services Why: for home health services Contact information: [address] [place] [address] [phone]         Shelva Majestic, MD Follow up.   Specialty: Family Medicine Contact information: [address] [place] [address] [phone]         Joylene Grapes, NP Follow up on 01/20/2023.   Specialties: Cardiology, Family Medicine Why: at 8:25am for your follow up appt with Dr. Erich Montane' NP Cydney Ok information: [address] [place]  [address] [phone]                Discharge Instructions     Diet - low sodium heart healthy   Complete by: As directed    Increase activity slowly   Complete by: As directed    No wound care   Complete by: As directed         Discharge Medications   Allergies as of 01/07/2023   No Known Allergies      Medication List     STOP taking these medications    diltiazem 120 MG 24 hr capsule Commonly known as: CARDIZEM CD       TAKE these medications    acetaminophen 325 MG tablet Commonly known as: TYLENOL Take  2 tablets (650 mg total) by mouth every 4 (four) hours as needed for mild pain (or temp > 37.5 C (99.5 F)).   amLODipine 5 MG tablet Commonly known as: NORVASC Take 0.5 tablets (2.5 mg total) by mouth daily.   amoxicillin 500 MG capsule Commonly known as: AMOXIL Take 500 mg by mouth as needed (Use for pre dental procedure).   apixaban 2.5 MG Tabs tablet Commonly known as: Eliquis Take 1 tablet (2.5 mg total) by mouth 2 (two) times daily.   Centrum Silver Adult 50+ Tabs Take 1 tablet by mouth daily.   diclofenac Sodium 1 % Gel Commonly known as: VOLTAREN Apply 2 g topically 4 (four) times daily.   ezetimibe 10 MG tablet Commonly known as: Zetia Take 1 tablet (10 mg total) by mouth daily.   ferrous sulfate 325 (65 FE) MG tablet Take 325 mg by mouth daily with breakfast.   FLUoxetine 10 MG capsule Commonly known as: PROZAC Take 1 capsule (10 mg total) by mouth daily.   furosemide 40 MG tablet Commonly known as: LASIX Take 1 tablet (40 mg total) by mouth daily.   isosorbide mononitrate 60 MG 24 hr tablet Commonly known as: IMDUR TAKE ONE TAB DAILY AFTER BREAKFAST. MAY TAKE AN ADDITONAL TAB IN EVENING X2DAYS IF USE NITROGLYCER What changed:  how much to take how to take this when to take this   nitroGLYCERIN 0.4 MG SL tablet Commonly known as: NITROSTAT ONE TABLET UNDER TONGUE WHEN NEEDED FOR CHEST PAIN. MAY REPEAT IN 5  MINUTES.   rosuvastatin 10 MG tablet Commonly known as: CRESTOR TAKE (1) TABLET DAILY AT BEDTIME. What changed:  how much to take how to take this when to take this additional instructions   topiramate 100 MG tablet Commonly known as: TOPAMAX Take 1 tablet (100 mg total) by mouth 2 (two) times daily.   traMADol 50 MG tablet Commonly known as: ULTRAM Take 50 mg by mouth 3 (three) times daily as needed for moderate pain.         Outstanding Labs/Studies   N/a   Duration of Discharge Encounter   Greater than 30 minutes including physician time.  Signed, Laverda Page, NP 01/07/2023, 1:56 PM

## 2023-01-07 NOTE — Patient Outreach (Signed)
  Care Coordination   Multidisciplinary Case Review Note    01/07/2023 Name: Mark Jackson. MRN: [id number] DOB: [birth year]  Mark Jackson. is a 89 y.o. year old male who sees Hunter, Aldine Contes, MD for primary care.  The  multidisciplinary care team met today to review patient care needs and barriers.  Patient with 2 admits within 30 days.  Patient with presyncope with sinus pause. Patient had loop reocrder place.     Goals Addressed   None     SDOH assessments and interventions completed:  No     Care Coordination Interventions Activated:  No   Care Coordination Interventions:  No, not indicated   Follow up plan:  as previously scheduled    Multidisciplinary Team Attendees:   Bevelyn Ngo, BSW, Antionette Fairy, RN, Fleeta Emmer, RN  Scribe for Multidisciplinary Case Review:  Fleeta Emmer, RN  Bary Leriche, RN, MSN [facility] Care Management Care Management Coordinator Direct [phone]

## 2023-01-07 NOTE — Telephone Encounter (Signed)
Patient dropped off document Home Health Certificate (Order ID None listed), to be filled out by provider. Patient requested to send it via Fax within 5-days. Document is located in providers tray at front office.Please advise

## 2023-01-07 NOTE — Progress Notes (Signed)
Patient arrived back from loop recorder procedure. Dr. Herbie Baltimore at bedside. VSS. Zio removed prior to procedure, brought back to room by PACU nurse. Dr. Herbie Baltimore told this nurse Luci Bank does not need to be reapplied to patient. Zio given to daughter at bedside.

## 2023-01-07 NOTE — Care Management CC44 (Signed)
Condition Code 44 Documentation Completed  Patient Details  Name: Mark Jackson. MRN: [id number] Date of Birth: [birth year]   Condition Code 44 given:  Yes Patient signature on Condition Code 44 notice:  Yes Documentation of 2 MD's agreement:  Yes Code 44 added to claim:  Yes    Lawerance Sabal, RN 01/07/2023, 2:06 PM

## 2023-01-08 ENCOUNTER — Ambulatory Visit: Payer: Medicare Other | Admitting: Nurse Practitioner

## 2023-01-08 ENCOUNTER — Encounter (HOSPITAL_COMMUNITY): Payer: Self-pay | Admitting: Cardiology

## 2023-01-08 ENCOUNTER — Telehealth: Payer: Self-pay

## 2023-01-08 NOTE — Telephone Encounter (Signed)
  Loop Recorder Follow up   Is patient connected to [facility]? Yes   Have steri-strips fallen off or been removed? No  Reminded patient to not remove steri-strips they will fall off on their own  Does the patient need in office follow up? No   Please continue to monitor your cardiac device site for redness, swelling, and drainage. Call the device clinic at [phone] if you experience these symptoms, fever/chills, or have questions about your device.   Remote monitoring is used to monitor your cardiac device from home. This monitoring is scheduled every month by our office. It allows [place] to keep an eye on the functioning of your device to ensure it is working properly.

## 2023-01-08 NOTE — Telephone Encounter (Signed)
-----   Message from Sheilah Pigeon, [place] sent at 01/07/2023  9:47 AM EDT ----- MDT loop today WC Syncope indication Has known PAFib

## 2023-01-09 ENCOUNTER — Other Ambulatory Visit: Payer: Self-pay | Admitting: Family Medicine

## 2023-01-09 DIAGNOSIS — I480 Paroxysmal atrial fibrillation: Secondary | ICD-10-CM | POA: Diagnosis not present

## 2023-01-09 DIAGNOSIS — I5033 Acute on chronic diastolic (congestive) heart failure: Secondary | ICD-10-CM | POA: Diagnosis not present

## 2023-01-09 DIAGNOSIS — S50911A Unspecified superficial injury of right forearm, initial encounter: Secondary | ICD-10-CM | POA: Diagnosis not present

## 2023-01-09 DIAGNOSIS — D631 Anemia in chronic kidney disease: Secondary | ICD-10-CM | POA: Diagnosis not present

## 2023-01-09 DIAGNOSIS — N184 Chronic kidney disease, stage 4 (severe): Secondary | ICD-10-CM | POA: Diagnosis not present

## 2023-01-09 DIAGNOSIS — I130 Hypertensive heart and chronic kidney disease with heart failure and stage 1 through stage 4 chronic kidney disease, or unspecified chronic kidney disease: Secondary | ICD-10-CM | POA: Diagnosis not present

## 2023-01-11 ENCOUNTER — Telehealth: Payer: Self-pay | Admitting: Family Medicine

## 2023-01-11 ENCOUNTER — Ambulatory Visit: Payer: Medicare Other | Attending: Cardiology | Admitting: Cardiology

## 2023-01-11 ENCOUNTER — Encounter: Payer: Self-pay | Admitting: Cardiology

## 2023-01-11 VITALS — BP 170/82 | HR 65 | Ht 68.0 in | Wt 193.4 lb

## 2023-01-11 DIAGNOSIS — I480 Paroxysmal atrial fibrillation: Secondary | ICD-10-CM

## 2023-01-11 DIAGNOSIS — R600 Localized edema: Secondary | ICD-10-CM | POA: Diagnosis not present

## 2023-01-11 DIAGNOSIS — E785 Hyperlipidemia, unspecified: Secondary | ICD-10-CM

## 2023-01-11 DIAGNOSIS — R5382 Chronic fatigue, unspecified: Secondary | ICD-10-CM

## 2023-01-11 DIAGNOSIS — Z87898 Personal history of other specified conditions: Secondary | ICD-10-CM | POA: Diagnosis not present

## 2023-01-11 DIAGNOSIS — I25119 Atherosclerotic heart disease of native coronary artery with unspecified angina pectoris: Secondary | ICD-10-CM

## 2023-01-11 DIAGNOSIS — Z7901 Long term (current) use of anticoagulants: Secondary | ICD-10-CM

## 2023-01-11 DIAGNOSIS — I455 Other specified heart block: Secondary | ICD-10-CM

## 2023-01-11 DIAGNOSIS — I6381 Other cerebral infarction due to occlusion or stenosis of small artery: Secondary | ICD-10-CM | POA: Diagnosis not present

## 2023-01-11 DIAGNOSIS — I10 Essential (primary) hypertension: Secondary | ICD-10-CM | POA: Diagnosis not present

## 2023-01-11 DIAGNOSIS — N184 Chronic kidney disease, stage 4 (severe): Secondary | ICD-10-CM | POA: Diagnosis not present

## 2023-01-11 DIAGNOSIS — Z952 Presence of prosthetic heart valve: Secondary | ICD-10-CM | POA: Diagnosis not present

## 2023-01-11 DIAGNOSIS — I6521 Occlusion and stenosis of right carotid artery: Secondary | ICD-10-CM

## 2023-01-11 NOTE — Patient Instructions (Addendum)
Medication Instructions:  No changes Can use Claritin or Allegra  for itchiness    Suggest -- blood pressure if pressure above 170/82 and  recheck after an hour - if still elevated  may take 5 mg of Amlodipine  as needed.  *If you need a refill on your cardiac medications before your next appointment, please call your pharmacy*   Lab Work: Not needed     Testing/Procedures: Not needed   Follow-Up: At [facility], you and your health needs are our priority.  As part of our continuing mission to provide you with exceptional heart care, we have created designated Provider Care Teams.  These Care Teams include your primary Cardiologist (physician) and Advanced Practice Providers (APPs -  Physician Assistants and Nurse Practitioners) who all work together to provide you with the care you need, when you need it.     Your next appointment:   2 month(s)  The format for your next appointment:   In Person  Provider:   Bernadene Person NP     Other Instructions    If your bruising is tender and hurting - you can use some ice on it  - 15 mins  x 3  -  can warm compress

## 2023-01-11 NOTE — Telephone Encounter (Signed)
What is he currently needing tramadol for pain for?

## 2023-01-11 NOTE — Telephone Encounter (Signed)
Home Health Verbal Orders  Agency:  [name]  CallerArchie Patten [phone]   Contact and title  Requesting OT/ PT/ Skilled nursing/ Social Work/ Speech:    Reason for Request:  NURSING 1 WK X 3 AND ONE EVERY TWO WKS X 4  Frequency:    HH needs F2F w/in last 30 days
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
## 2023-01-12 ENCOUNTER — Telehealth: Payer: Self-pay | Admitting: Family Medicine

## 2023-01-12 ENCOUNTER — Telehealth: Payer: Self-pay | Admitting: Cardiology

## 2023-01-12 DIAGNOSIS — I480 Paroxysmal atrial fibrillation: Secondary | ICD-10-CM | POA: Diagnosis not present

## 2023-01-12 DIAGNOSIS — D631 Anemia in chronic kidney disease: Secondary | ICD-10-CM | POA: Diagnosis not present

## 2023-01-12 DIAGNOSIS — N184 Chronic kidney disease, stage 4 (severe): Secondary | ICD-10-CM | POA: Diagnosis not present

## 2023-01-12 DIAGNOSIS — S50911A Unspecified superficial injury of right forearm, initial encounter: Secondary | ICD-10-CM | POA: Diagnosis not present

## 2023-01-12 DIAGNOSIS — I130 Hypertensive heart and chronic kidney disease with heart failure and stage 1 through stage 4 chronic kidney disease, or unspecified chronic kidney disease: Secondary | ICD-10-CM | POA: Diagnosis not present

## 2023-01-12 DIAGNOSIS — I5033 Acute on chronic diastolic (congestive) heart failure: Secondary | ICD-10-CM | POA: Diagnosis not present

## 2023-01-12 NOTE — Telephone Encounter (Signed)
Patient dropped off document Home Health Certificate (Order ID none listed ), to be filled out by provider. Patient requested to send it via Fax within 5-days. Document is located in providers tray at front office.Please advise

## 2023-01-12 NOTE — Telephone Encounter (Signed)
Mark Jackson, PT with [facility] home health called in to report pt bp was elevated today at 164/89 at rest.  He would like to know what are the parameters for high bp letting him know that he needs to call and report to Dr. Herbie Baltimore. He states right now it is 160/90   He also stated that pt's caregiver reported when pt's bp is above 175 they are supposed to give extra medication. He would like clarification on which one and how much  Please advise.

## 2023-01-12 NOTE — Telephone Encounter (Signed)
Forms completed and faxed back.

## 2023-01-12 NOTE — Telephone Encounter (Signed)
Called and spoke with Tonya and VO given.  

## 2023-01-12 NOTE — Telephone Encounter (Signed)
Spoke to Threasa Alpha, PT with [facility] health, today during [facility] visit pt bp 164/89 he was asymptomatic. Patient mentioned to [name] that his if his bp is elevated to take additional medications. However, pt did not remember the name of the medication and the family member who is managing pt meds was not home during the visit. Pt was seen in office yesterday, was advised :  Suggest -- blood pressure if pressure above 170/82 and recheck after an hour - if still elevated may take 5 mg of Amlodipine as needed.   [name], PT stated pt does have parameters if  bp is 160/90 to hold physical therapy. However, since yesterday's OV   [name] will like to know  should pt  parameters remain the same or did they change. Also [name] will like for our office to fax over the recent office visit since pt changes with his medications. Will forward to MD and nurse for advise.

## 2023-01-12 NOTE — Progress Notes (Incomplete)
Primary Care Provider: Shelva Majestic, MD [facility] Cardiologist: Bryan Lemma, MD Electrophysiologist: None  Clinic Note: No chief complaint on file.   ===================================  ASSESSMENT/PLAN   Problem List Items Addressed This Visit   None   ===================================  HPI:    Mark Jackson. is a 89 y.o. male with a PMH below who presents today for early f/u (originally scheduled to be 6 months) as a post hospital follow-up - s/p ILR placement to evaluate for persistent sinus pauses/sinus arrest or recurrent A-fib RVR off of diltiazem.  CARDIOVASCULAR HISTORY  Paroxysmal A. fib: On Eliquis.  (Beta-blocker intolerant because of fatigue-using low-dose diltiazem for rate control) TEE DCCV in January 2016 CAD-overlapping LAD stents NSTEMI 8/'16: Prox LAD 80% --> PCI 2.75 x 16 mm Synergy DES -- 3.3 mm;  Crescendo Angina 10/'17: Synergy DES 3.0x12 (3.6 mm) to ostial-proxmial LAD onverlaps prior stent proximally.; c 04/2019 - patent stents. Mod AS Coronary diagram 04/26/2019 -patent overlapping LAD stents  History of recurrent syncope -> Most Recent Episode 12/23/2022 Zio Patch -> 6.4 Sec pause --> admitted for Diltiazem washout  => EP opted for ILR > PPM ILR Placed 5/30/224 Right Carotid Stenosis Moderate-Severe Aortic Stenosis (Felt to Be Low Output Low Gradient)-S/p TAVR 12/12/19: (Presenting symptom was syncope gradients by echo were not severe)  TAVR with a 26 mm Edwards S3U via the left subclavian approach by Drs Excell Seltzer and Bartle -  Echo 12/04/2020: AoV gradients increased-mean gradient 26 mmHg. ->  Concern for possible prosthetic valve obstruction -> plan was to reinitiate anticoagulation and recheck. Follow-up echo 02/26/2021: Normal LVEF 60 to 65%.  No RWM A.  Indeterminant DF.  Moderate LA dilation.  26 mm Sapien prosthetic TAVR AoV present-mean gradient now 16 mmHg.  Notably improved.  I last saw [name]. on February  10  was last seen on ***  Recent Hospitalizations:  5/19-2023  Reviewed  CV studies:    The following studies were reviewed today: (if available, images/films reviewed: From Epic Chart or Care Everywhere) TTE 12/26/2021: LVEF 55 to 60%.  Normal wall motion.  Normal diastolic parameters.  Mildly elevated PAP.  Moderate LA dilation.  Trivial MR.  26 mm SAPIEN TAV R valve well-seated =>  Peak and Mean Gradients 18 and 10 mmHg, no AI Carotid Dopplers 5/2302023: R Carotid Bruit - R ICA 60-79% (notable increase from 10/30/19); L ICA 1-39%; normal bilateral vertebral and carotid arteries  TTE 12/24/2022: EF 55 to 60%.  GR 1 DD.  Normal RV size and function.  Normal MV.  Peak-Mean AoV Gradient 26.5 and 14 mmHg (increased from 10 mmHg; Avg Vmax has increased from 2. 13 to 2. 18m/ s and DVI has decreased from 0. 74 to 0. 59). Normal RV size & function, normal RVP & RAP.  Carotid Dopplers 12/24/2022: R ICA c/w 80-99% (progression), ECA > 50%. L ICA 1-39%.  Normal bilateral vertebral subclavian arteries  Interval History:   [name]. returns today shortly after his hospitalization overall doing pretty well.  He does not really notice much of a difference since stopping the diltiazem.  Maybe a little better energy level.  His lower extremity edema is pretty stable, and notes that it is very difficult to put on the support stockings even using the sock caddy.  He does have PND orthopnea, does not quite sleep flat more for comfort.  No chest pain or pressure at rest or exertion, but does get a little exertional  dyspnea mostly because he is extremely deconditioned and relatively minimal ability.  Mobility is really limited by unsteady gait associated with foot swelling. Thankfully, he has not had any lightheadedness or dizziness or wooziness, syncope or near syncope type symptoms since his hospitalization. No headaches or blurred vision.    CV Review of Symptoms (Summary): no chest pain or dyspnea on  exertion positive for - edema and not all that active.  Has been more tired than usual lately.  Although energy is a little better.  Exercise intolerance-probably because of unsteady gait. negative for - chest pain, loss of consciousness, orthopnea, palpitations, paroxysmal nocturnal dyspnea, rapid heart rate, shortness of breath, or TIA or fibrosis fugax, claudication  REVIEWED OF SYSTEMS   Review of Systems  Constitutional:  Positive for malaise/fatigue (Energy level is in the greatest.  Has some exercise tolerance.).  HENT:  Negative for nosebleeds.   Respiratory:  Negative for cough and shortness of breath.   Cardiovascular:  Negative for claudication.  Gastrointestinal:  Positive for constipation (Occasionally). Negative for blood in stool and melena.  Genitourinary:  Negative for hematuria.  Musculoskeletal:  Positive for joint pain. Negative for falls and myalgias.  Skin:  Positive for itching (Diffuse itching on the torso, arms and legs.  No obvious rash.).   I have reviewed and (if needed) personally updated the patient's problem list, medications, allergies, past medical and surgical history, social and family history.   PAST MEDICAL HISTORY   Pertinent PMH noted above otherwise reviewed in epic.  PAST SURGICAL HISTORY   Reviewed in epic.  MEDICATIONS/ALLERGIES   Current Meds  Medication Sig  . acetaminophen (TYLENOL) 325 MG tablet Take 2 tablets (650 mg total) by mouth every 4 (four) hours as needed for mild pain (or temp > 37.5 C (99.5 F)).  Marland Kitchen amLODipine (NORVASC) 5 MG tablet Take 0.5 tablets (2.5 mg total) by mouth daily.  Marland Kitchen apixaban (ELIQUIS) 2.5 MG TABS tablet Take 1 tablet (2.5 mg total) by mouth 2 (two) times daily.  . diclofenac Sodium (VOLTAREN) 1 % GEL Apply 2 g topically 4 (four) times daily.  Marland Kitchen ezetimibe (ZETIA) 10 MG tablet Take 1 tablet (10 mg total) by mouth daily.  . ferrous sulfate 325 (65 FE) MG tablet Take 325 mg by mouth daily with breakfast.  .  FLUoxetine (PROZAC) 10 MG capsule Take 1 capsule (10 mg total) by mouth daily.  . furosemide (LASIX) 40 MG tablet Take 1 tablet (40 mg total) by mouth daily.  . isosorbide mononitrate (IMDUR) 60 MG 24 hr tablet TAKE ONE TAB DAILY AFTER BREAKFAST. MAY TAKE AN ADDITONAL TAB IN EVENING X2DAYS IF USE NITROGLYCER (Patient taking differently: Take 60 mg by mouth daily. TAKE ONE TAB DAILY AFTER BREAKFAST. MAY TAKE AN ADDITONAL TAB IN EVENING X2DAYS IF USE NITROGLYCER)  . Multiple Vitamins-Minerals ( Take 1 tablet by mouth daily.  CENTRUM SILVER ADULT 50+) TABS  . nitroGLYCERIN (NITROSTAT) 0.4 MG SL tablet ONE TABLET UNDER TONGUE WHEN NEEDED FOR CHEST PAIN. MAY REPEAT IN 5 MINUTES.  . rosuvastatin (CRESTOR) 10 MG tablet TAKE (1) TABLET DAILY AT BEDTIME. (Patient taking differently: Take 10 mg by mouth daily.)  . topiramate (TOPAMAX) 100 MG tablet Take 1 tablet (100 mg total) by mouth 2 (two) times daily.  . traMADol (ULTRAM) 50 MG tablet Take 50 mg by mouth 3 (three) times daily as needed for moderate pain.    No Known Allergies  SOCIAL HISTORY/FAMILY HISTORY   Reviewed in Epic:  Pertinent findings: Former  pipe/cigar smoker.  Quit in 1970.  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 Son took over.    He is back now working 4 to 5 days a week trying at least 6 hours a day.   -> Working keeps him feeling fulfilled, and occupied.  It gives him a sense of being needed.  Also keeps him from being bored.  He says that it keeps his brain sharp.    OBJCTIVE -PE, EKG, labs   Wt Readings from Last 3 Encounters:  01/11/23 193 lb 6.4 oz (87.7 kg)  01/06/23 190 lb 7.6 oz (86.4 kg)  01/05/23 196 lb (88.9 kg)  09/19/21-187 pounds  Physical Exam: BP (!) 170/82 (BP Location:  Left Arm, Patient Position: Sitting, Cuff Size: Normal)   Pulse 65   Ht 5\' 8"  (1.727 m)   Wt 193 lb 6.4 oz (87.7 kg)   SpO2 96%   BMI 29.41 kg/m  Physical Exam Constitutional:      General: He is not in acute distress.    Appearance: Normal appearance. He is normal weight. He is not ill-appearing (Relatively healthy appearing for his age.) or toxic-appearing.  HENT:     Head: Normocephalic and atraumatic.  Neck:     Vascular: Carotid bruit (Cannot exclude right-sided bruit versus radial AoV murmur.) present. No JVD.  Cardiovascular:     Rate and Rhythm: Normal rate and regular rhythm. Occasional Extrasystoles are present.    Chest Wall: PMI is not displaced.     Pulses: Decreased pulses (Diminished pedal pulses-difficult to palpate due to edema.).     Heart sounds: S1 normal and S2 normal. Murmur heard.     High-pitched harsh crescendo-decrescendo midsystolic murmur is present with a grade of 2/6 at the upper right sternal border radiating to the neck.     No friction rub. No gallop.  Pulmonary:     Effort: Pulmonary effort is normal. No respiratory distress.     Breath sounds: Normal breath sounds. No wheezing, rhonchi or rales.     Comments: Chest wall has ecchymosis at the ILR insertion site and then runs dependent down the pectoral border.  No tenderness and no hematoma.  Dressing is Tegaderm with dry bloodsoaked gauze.  No exudate Musculoskeletal:        General: Swelling (2-3+ BLE edema up to knees.  Wearing diabetic socks.  (Very difficult to place support stockings)) present.     Cervical back: Normal range of motion and neck supple.  Skin:    General: Skin is warm and dry.  Neurological:     General: No focal deficit present.     Mental Status: He is alert and oriented to person, place, and time.     Cranial Nerves: No cranial nerve deficit.     Gait: Gait abnormal.  Psychiatric:        Mood and Affect: Mood normal.        Behavior: Behavior normal.        Thought  Content: Thought content normal.        Judgment: Judgment normal.     Comments: Mentally sharp; excellent executive function capacity     Adult ECG Report  Rate: 65;  Rhythm:  normal sinus rhythm and 1  AVB (very prolonged PR interval); otherwise the left axis deviation and LVH ;   Narrative Interpretation: Heart rate is slightly faster than 4 otherwise stable.  Recent Labs: Reviewed Lab Results  Component Value Date   CHOL 132 12/27/2021   HDL 39 (L) 12/27/2021   LDLCALC 80 12/27/2021   LDLDIRECT 38.0 07/16/2022   TRIG 63 12/27/2021   CHOLHDL 3.4 12/27/2021   Lab Results  Component Value Date   CREATININE 2.73 (H) 01/07/2023   BUN 65 (H) 01/07/2023   NA 141 01/07/2023   K 4.3 01/07/2023   CL 113 (H) 01/07/2023   CO2 20 (L) 01/07/2023      Latest Ref Rng & Units 01/07/2023    2:34 AM 01/06/2023    5:44 AM 01/06/2023    2:05 AM  CBC  WBC 4.0 - 10.5 K/uL 11.5  11.7  11.0   Hemoglobin 13.0 - 17.0 g/dL 78.2  95.6  21.3   Hematocrit 39.0 - 52.0 % 30.6  32.1  33.0   Platelets 150 - 400 K/uL 134  148  149     Lab Results  Component Value Date   HGBA1C 5.8 (H) 12/27/2021   Lab Results  Component Value Date   TSH 3.03 03/05/2022    ================================================== I spent a total of ***minutes with the patient spent in direct patient consultation.  Additional time spent with chart review  / charting (studies, outside notes, etc): *** min Total Time: *** min  Current medicines are reviewed at length with the patient today.  (+/- concerns) ***  Notice: This dictation was prepared with Dragon dictation along with smart phrase technology. Any transcriptional errors that result from this process are unintentional and may not be corrected upon review.  Studies Ordered:   No orders of the defined types were placed in this encounter.  No orders of the defined types were placed in this encounter.   Patient Instructions / Medication Changes & Studies &  Tests Ordered   There are no Patient Instructions on file for this visit.     Marykay Lex, MD, MS Bryan Lemma, M.D., M.S. Interventional Cardiologist  [facility]  Pager # [phone] Phone # [phone] [address] [place], [place] [address]   Thank you for choosing [facility] [place]!!

## 2023-01-12 NOTE — Telephone Encounter (Signed)
Called and lm on [place] confidential vm with VO.

## 2023-01-12 NOTE — Telephone Encounter (Signed)
Home Health Verbal Orders  Agency:  [facility]  Caller: Beckey Downing and title Ph# [phone]  Requesting  PT:    Reason for Request:  Patient was Hospitalized and is a high fall risk  Frequency:  2 x week for 3 weeks, then 1 x week for 3 weeks  164/89-Blood pressure value 01/12/23- no symptoms  HH needs F2F w/in last 30 days

## 2023-01-13 ENCOUNTER — Encounter: Payer: Self-pay | Admitting: Cardiology

## 2023-01-13 NOTE — Assessment & Plan Note (Signed)
At this point I think is really chronic venous stasis and lymphedema.  Less likely related to diastolic dysfunction because his RV P and RAP are both normal on echo.  No dilated IVC.  Again we discussed the importance of foot elevation.  He can wear support socks so we will try to figure out if there is any way we can get him supervision.  Compression devices at home.  Potentially lymphedema pump.

## 2023-01-13 NOTE — Assessment & Plan Note (Addendum)
Echo from last month showed mild progression of mean gradient but last year it had gone down from 16 to about 10 mmHg.  It is now back up to 14 mg.  Pretty much the gradients are stable.  Valve looks good.  LVEF looks normal with normal wall motion.  Normal RV and RAP which would suggest that his edema is not related to CHF.  As needed amoxicillin prescription for SBE prophylaxis

## 2023-01-13 NOTE — Assessment & Plan Note (Signed)
Progression of right ICA stenosis from 60-79% to current reading of 80 to 99%.  Due to see Dr. Lenell Antu in August.  Had previously been followed by Dr. Myra Gianotti but not seen since 2021.  Continue lipid management as well as maintaining stable blood pressure.

## 2023-01-13 NOTE — Assessment & Plan Note (Addendum)
About 1 year out from stroke.  I think this was a major event for him and that he has stopped driving and stopped being as active as he used to be.  I think he is somewhat scared to walk now.  Needs to continue to progress with physical therapy.  He had hold parameters for blood pressures greater than 160/90 as far as PT.  I think we can change that to SBP> 170 mmHg.  It is too important for him to do PT and not avoid today of PT.

## 2023-01-13 NOTE — Assessment & Plan Note (Signed)
Most recent direct LDL in December was 38.  This was show improved after addition of ezetimibe to the standing dose of rosuvastatin 10 mg.  Well within goal.

## 2023-01-13 NOTE — Assessment & Plan Note (Signed)
Creatinine is about 2.7 which is pretty much now it is stable rate unfortunately.  Being followed by Dr. Kathrene Bongo.  Allowing for mild permissive hypertension probably because of orthostatic hypotension issues in the past and frequent falls.  No options for RAAS inhibition.

## 2023-01-13 NOTE — Assessment & Plan Note (Signed)
Continues on low-dose Eliquis 2.5 mg twice daily because of renal insufficiency.  Eliquis was not held for his procedure and therefore he is quite a bit of bruising.  Okay to hold Eliquis 2 to 3 days preop for surgeries or procedures.

## 2023-01-13 NOTE — Assessment & Plan Note (Signed)
He has had issues with these syncopal type episodes for years now.  We thought maybe was related to orthostatic hypotension so we have been allowing for permissive hypertension.  Also some potential vasovagal episodes. He then had progression of the aortic valve to severe aortic stenosis and the thought was that it could have been related to aortic valve disease.  However despite TAVR, he still has had syncopal episodes.  Now sinus pause of 6.4 seconds 2 weeks after having already decreased dose of diltiazem (that had been increased in the setting of CVA in 2023). => ILR placed.  Continue to encourage adequate hydration and permissive hypertension.

## 2023-01-13 NOTE — Assessment & Plan Note (Signed)
6.4-second pause-consulted by Dr. Graciela Husbands in the hospital with ILR placed and Zio patch now turned in.  Taking the risk that he will have another pause but not syncopal episode prior to considering pacemaker placement.  Has pretty significant bruising on the ILR insertion site because of the procedure was on Eliquis.  Ice pack for 24 hours and then warm compress.  No hematoma.  ILR results to be followed up by EP.

## 2023-01-13 NOTE — Assessment & Plan Note (Addendum)
No active anginal symptoms.  2 overlapping stents in the LAD but otherwise pretty normal disease.  Stents were patent in September 2020. No longer on either aspirin or Plavix while he is on Eliquis.  No beta-blocker because of fatigue and weakness and now obviously because of bradycardia We had tried to have him on diltiazem as a valid alternative for beta-blocker given his preserved EF.  This was serving as both rate control for atrial fibs/flutter as well as antianginal and BP control. Fortunately, now because of prolonged first-degree AV block, Wenckebach block and sinus pauses, we can no longer use AV nodal agents.  We converted him from diltiazem to amlodipine at 2.5 mg daily. Continue combination of rosuvastatin and Zetia. Continue Imdur 60 mg daily.  (Okay to take additional evening tablet for 2 days if he has to use nitroglycerin)

## 2023-01-13 NOTE — Assessment & Plan Note (Signed)
Was converted to diltiazem 120 mg to amlodipine 2.5 mg for BP control.   Can take an additional 2.5 mg for sustained SBP greater than 170 / 80 bpm (meaning that it is still elevated after 1 hour)  I am reluctant to push this dose further because of his edema issues. He had been started on hydralazine back at the time of his stroke, but this had been discontinued for various reasons.  In general, would allow for systolic blood pressure range between the 110-170 bpm without being concerned.  We are more concerned about orthostatic hypotension and falls than hypertension.

## 2023-01-13 NOTE — Assessment & Plan Note (Signed)
At this point I think his fatigue is really because he is not doing a lot.  No longer on diltiazem or anything that would keep his heart rate slow.  We allow for permissive hypertension to avoid orthostatic hypotension.  Still has issues with sleeping all night long as of nocturia.

## 2023-01-13 NOTE — Telephone Encounter (Signed)
Spoke to [place] ,[facility]  Direction given per Dr Herbie Baltimore  orders--   Too hard to set these groundrules - OK to do PT as long as SBP not > 180 mmHg.  Bryan Lemma, MD  And  Per note 01/11/23 blood pressure if pressure above 170/82 and recheck after an hour - if still elevated may take 5 mg of Amlodipine as needed.  ( This would be a total 7.5 mg = 2.5 mg reg daily dose +5 mg extra)   Jim verbalized understanding.

## 2023-01-13 NOTE — Assessment & Plan Note (Deleted)
6.4-second pause-consulted by Dr. Klein in the hospital with ILR placed and Zio patch now turned in.  Taking the risk that he will have another pause but not syncopal episode prior to considering pacemaker placement.  Has pretty significant bruising on the ILR insertion site because of the procedure was on Eliquis.  Ice pack for 24 hours and then warm compress.  No hematoma.  ILR results to be followed up by EP. 

## 2023-01-14 NOTE — Telephone Encounter (Signed)
Mark Jackson state's that patient takes PRN for migraines, does not take daily.

## 2023-01-15 DIAGNOSIS — I480 Paroxysmal atrial fibrillation: Secondary | ICD-10-CM | POA: Diagnosis not present

## 2023-01-15 DIAGNOSIS — N184 Chronic kidney disease, stage 4 (severe): Secondary | ICD-10-CM | POA: Diagnosis not present

## 2023-01-15 DIAGNOSIS — I5033 Acute on chronic diastolic (congestive) heart failure: Secondary | ICD-10-CM | POA: Diagnosis not present

## 2023-01-15 DIAGNOSIS — D631 Anemia in chronic kidney disease: Secondary | ICD-10-CM | POA: Diagnosis not present

## 2023-01-15 DIAGNOSIS — I130 Hypertensive heart and chronic kidney disease with heart failure and stage 1 through stage 4 chronic kidney disease, or unspecified chronic kidney disease: Secondary | ICD-10-CM | POA: Diagnosis not present

## 2023-01-15 DIAGNOSIS — S50911A Unspecified superficial injury of right forearm, initial encounter: Secondary | ICD-10-CM | POA: Diagnosis not present

## 2023-01-18 ENCOUNTER — Ambulatory Visit: Payer: Self-pay

## 2023-01-18 ENCOUNTER — Telehealth: Payer: Self-pay | Admitting: Family Medicine

## 2023-01-18 DIAGNOSIS — N184 Chronic kidney disease, stage 4 (severe): Secondary | ICD-10-CM | POA: Diagnosis not present

## 2023-01-18 DIAGNOSIS — I130 Hypertensive heart and chronic kidney disease with heart failure and stage 1 through stage 4 chronic kidney disease, or unspecified chronic kidney disease: Secondary | ICD-10-CM | POA: Diagnosis not present

## 2023-01-18 DIAGNOSIS — I5033 Acute on chronic diastolic (congestive) heart failure: Secondary | ICD-10-CM | POA: Diagnosis not present

## 2023-01-18 DIAGNOSIS — S50911A Unspecified superficial injury of right forearm, initial encounter: Secondary | ICD-10-CM | POA: Diagnosis not present

## 2023-01-18 DIAGNOSIS — I480 Paroxysmal atrial fibrillation: Secondary | ICD-10-CM | POA: Diagnosis not present

## 2023-01-18 DIAGNOSIS — D631 Anemia in chronic kidney disease: Secondary | ICD-10-CM | POA: Diagnosis not present

## 2023-01-18 NOTE — Telephone Encounter (Signed)
Form complete and faxed back to [facility].

## 2023-01-18 NOTE — Telephone Encounter (Signed)
Received faxed   document Home Health Certificate (Order ID [id number]), to be filled out by provider.  requested to send it via Fax [phone]. Document is located in providers tray at front office.

## 2023-01-18 NOTE — Patient Instructions (Signed)
Visit Information  Thank you for taking time to visit with me today. Please don't hesitate to contact me if I can be of assistance to you.   Following are the goals we discussed today:   Goals Addressed             This Visit's Progress    A,Fib managment       Care Coordination Interventions: Counseled on increased risk of stroke due to Afib and benefits of anticoagulation for stroke prevention Reviewed importance of adherence to anticoagulant exactly as prescribed  Patient doing well. Now has implantable loop recorder.  To take bandage off today.  Discussed signs of infection to site.  He verbalized understanding.  Weight 184 lbs. Recent visit with cardiology. No concerns.         Our next appointment is by telephone on 02/08/23 at 12:45 pm  Please call the care guide team at [phone] if you need to cancel or reschedule your appointment.   If you are experiencing a Mental Health or Behavioral Health Crisis or need someone to talk to, please call the Suicide and Crisis Lifeline: 988   Patient verbalizes understanding of instructions and care plan provided today and agrees to view in MyChart. Active MyChart status and patient understanding of how to access instructions and care plan via MyChart confirmed with patient.     The patient has been provided with contact information for the care management team and has been advised to call with any health related questions or concerns.   Bary Leriche, RN, MSN [facility] Care Management Care Management Coordinator Direct Line [phone]

## 2023-01-18 NOTE — Patient Outreach (Signed)
  Care Coordination   Follow Up Visit Note   01/18/2023 Name: Mark Jackson. MRN: [id number] DOB: 19-Jan-1930  Mark Pao. is a 89 y.o. year old male who sees Hunter, Aldine Contes, MD for primary care. I spoke with  Mark Pao. by phone today. Patient reports doing well.  He denies any syncope episodes but some dizziness at times.  ILR in place currently.  What matters to the patients health and wellness today?  Maintain health    Goals Addressed             This Visit's Progress    A,Fib managment       Care Coordination Interventions: Counseled on increased risk of stroke due to Afib and benefits of anticoagulation for stroke prevention Reviewed importance of adherence to anticoagulant exactly as prescribed  Patient doing well. Now has implantable loop recorder.  To take bandage off today.  Discussed signs of infection to site.  He verbalized understanding.  Weight 184 lbs. Recent visit with cardiology. No concerns.         SDOH assessments and interventions completed:  Yes     Care Coordination Interventions:  Yes, provided   Follow up plan: Follow up call scheduled for July    Encounter Outcome:  Pt. Visit Completed   Bary Leriche, RN, MSN [facility] Care Management Care Management Coordinator Direct Line [phone]

## 2023-01-19 ENCOUNTER — Telehealth: Payer: Self-pay | Admitting: Cardiology

## 2023-01-19 ENCOUNTER — Emergency Department (HOSPITAL_COMMUNITY)
Admission: EM | Admit: 2023-01-19 | Discharge: 2023-01-19 | Disposition: A | Discharge status: Home / Self Care | Payer: Medicare Other | Attending: Emergency Medicine | Admitting: Emergency Medicine

## 2023-01-19 ENCOUNTER — Emergency Department (HOSPITAL_COMMUNITY): Payer: Medicare Other

## 2023-01-19 ENCOUNTER — Other Ambulatory Visit: Payer: Self-pay

## 2023-01-19 DIAGNOSIS — Z7901 Long term (current) use of anticoagulants: Secondary | ICD-10-CM | POA: Diagnosis not present

## 2023-01-19 DIAGNOSIS — S50911A Unspecified superficial injury of right forearm, initial encounter: Secondary | ICD-10-CM | POA: Diagnosis not present

## 2023-01-19 DIAGNOSIS — N184 Chronic kidney disease, stage 4 (severe): Secondary | ICD-10-CM | POA: Diagnosis not present

## 2023-01-19 DIAGNOSIS — R55 Syncope and collapse: Secondary | ICD-10-CM | POA: Diagnosis not present

## 2023-01-19 DIAGNOSIS — Z952 Presence of prosthetic heart valve: Secondary | ICD-10-CM | POA: Diagnosis not present

## 2023-01-19 DIAGNOSIS — I480 Paroxysmal atrial fibrillation: Secondary | ICD-10-CM | POA: Diagnosis not present

## 2023-01-19 DIAGNOSIS — R42 Dizziness and giddiness: Secondary | ICD-10-CM | POA: Diagnosis not present

## 2023-01-19 DIAGNOSIS — N189 Chronic kidney disease, unspecified: Secondary | ICD-10-CM | POA: Insufficient documentation

## 2023-01-19 DIAGNOSIS — I130 Hypertensive heart and chronic kidney disease with heart failure and stage 1 through stage 4 chronic kidney disease, or unspecified chronic kidney disease: Secondary | ICD-10-CM | POA: Diagnosis not present

## 2023-01-19 DIAGNOSIS — I10 Essential (primary) hypertension: Secondary | ICD-10-CM | POA: Diagnosis not present

## 2023-01-19 DIAGNOSIS — R112 Nausea with vomiting, unspecified: Secondary | ICD-10-CM | POA: Diagnosis not present

## 2023-01-19 DIAGNOSIS — R079 Chest pain, unspecified: Secondary | ICD-10-CM | POA: Insufficient documentation

## 2023-01-19 DIAGNOSIS — I2510 Atherosclerotic heart disease of native coronary artery without angina pectoris: Secondary | ICD-10-CM | POA: Insufficient documentation

## 2023-01-19 DIAGNOSIS — R0789 Other chest pain: Secondary | ICD-10-CM | POA: Diagnosis not present

## 2023-01-19 DIAGNOSIS — R600 Localized edema: Secondary | ICD-10-CM | POA: Insufficient documentation

## 2023-01-19 DIAGNOSIS — I129 Hypertensive chronic kidney disease with stage 1 through stage 4 chronic kidney disease, or unspecified chronic kidney disease: Secondary | ICD-10-CM | POA: Diagnosis not present

## 2023-01-19 DIAGNOSIS — I4430 Unspecified atrioventricular block: Secondary | ICD-10-CM | POA: Diagnosis not present

## 2023-01-19 DIAGNOSIS — Z955 Presence of coronary angioplasty implant and graft: Secondary | ICD-10-CM | POA: Diagnosis not present

## 2023-01-19 DIAGNOSIS — I5033 Acute on chronic diastolic (congestive) heart failure: Secondary | ICD-10-CM | POA: Diagnosis not present

## 2023-01-19 DIAGNOSIS — D631 Anemia in chronic kidney disease: Secondary | ICD-10-CM | POA: Diagnosis not present

## 2023-01-19 LAB — COMPREHENSIVE METABOLIC PANEL
ALT: 13 U/L (ref 0–44)
AST: 22 U/L (ref 15–41)
Albumin: 3.1 g/dL — ABNORMAL LOW (ref 3.5–5.0)
Alkaline Phosphatase: 69 U/L (ref 38–126)
Anion gap: 10 (ref 5–15)
BUN: 54 mg/dL — ABNORMAL HIGH (ref 8–23)
CO2: 17 mmol/L — ABNORMAL LOW (ref 22–32)
Calcium: 8 mg/dL — ABNORMAL LOW (ref 8.9–10.3)
Chloride: 110 mmol/L (ref 98–111)
Creatinine, Ser: 2.85 mg/dL — ABNORMAL HIGH (ref 0.61–1.24)
GFR, Estimated: 20 mL/min — ABNORMAL LOW (ref >60)
Glucose, Bld: 115 mg/dL — ABNORMAL HIGH (ref 70–99)
Potassium: 4.3 mmol/L (ref 3.5–5.1)
Sodium: 137 mmol/L (ref 135–145)
Total Bilirubin: 0.8 mg/dL (ref 0.3–1.2)
Total Protein: 6.1 g/dL — ABNORMAL LOW (ref 6.5–8.1)

## 2023-01-19 LAB — CBC WITH DIFFERENTIAL/PLATELET
Abs Immature Granulocytes: 0.08 10*3/uL — ABNORMAL HIGH (ref 0.00–0.07)
Basophils Absolute: 0.1 10*3/uL (ref 0.0–0.1)
Basophils Relative: 0 %
Eosinophils Absolute: 0.3 10*3/uL (ref 0.0–0.5)
Eosinophils Relative: 2 %
HCT: 34 % — ABNORMAL LOW (ref 39.0–52.0)
Hemoglobin: 10.5 g/dL — ABNORMAL LOW (ref 13.0–17.0)
Immature Granulocytes: 1 %
Lymphocytes Relative: 8 %
Lymphs Abs: 0.9 10*3/uL (ref 0.7–4.0)
MCH: 33.1 pg (ref 26.0–34.0)
MCHC: 30.9 g/dL (ref 30.0–36.0)
MCV: 107.3 fL — ABNORMAL HIGH (ref 80.0–100.0)
Monocytes Absolute: 0.9 10*3/uL (ref 0.1–1.0)
Monocytes Relative: 8 %
Neutro Abs: 9.8 10*3/uL — ABNORMAL HIGH (ref 1.7–7.7)
Neutrophils Relative %: 81 %
Platelets: 119 10*3/uL — ABNORMAL LOW (ref 150–400)
RBC: 3.17 MIL/uL — ABNORMAL LOW (ref 4.22–5.81)
RDW: 12.8 % (ref 11.5–15.5)
WBC: 12 10*3/uL — ABNORMAL HIGH (ref 4.0–10.5)
nRBC: 0 % (ref 0.0–0.2)

## 2023-01-19 LAB — BRAIN NATRIURETIC PEPTIDE: B Natriuretic Peptide: 350.5 pg/mL — ABNORMAL HIGH (ref 0.0–100.0)

## 2023-01-19 LAB — TROPONIN I (HIGH SENSITIVITY)
Troponin I (High Sensitivity): 51 ng/L — ABNORMAL HIGH (ref <18)
Troponin I (High Sensitivity): 36 ng/L — ABNORMAL HIGH (ref <18)

## 2023-01-19 NOTE — Telephone Encounter (Signed)
Mark Jackson with [name] called back stating pt is feeling weak, lightheaded, woozy, and nauseated.

## 2023-01-19 NOTE — ED Provider Notes (Signed)
[place] EMERGENCY DEPARTMENT AT [facility] Provider Note   CSN: [id number] Arrival date & time: 01/19/23  1443     History  Chief Complaint  Patient presents with   Chest Pain    Mark Jackson. is a 89 y.o. male. With pmh HTN, CAD s/p PCI, HTN, CKD, pAF on eliquis and diltiazem, AS s/p TAVR, migraine, and recurrent syncope presents after near syncope.  Patient was feeling unwell in the middle of the day today with nausea and his home nurse stood him up and he felt unwell and she checked his blood pressure and it was elevated from baseline.  She gave him 2 sublingual nitroglycerin as he was reporting chest pain at the time and proceeded to vomit 2 times in the ambulance on the way to the ED.  Family checked on him prior to the episode and denied any slurred speech or focal weakness or facial droop.  On arrival to ED, he is feeling fine and denying any complaints.  Family notes he has not been ill recently, no fevers, no chills, no vomiting or diarrhea prior to this.  Patient is denying any active chest pain now or shortness of breath.  He is denying any active nausea or abdominal pain.  He was recently in the hospital for syncopal episodes and had his Cardizem decreased and a loop recorder placed.   Chest Pain      Home Medications Prior to Admission medications   Medication Sig Start Date End Date Taking? Authorizing Provider  acetaminophen (TYLENOL) 325 MG tablet Take 2 tablets (650 mg total) by mouth every 4 (four) hours as needed for mild pain (or temp > 37.5 C (99.5 F)). 01/12/22   Angiulli, Mcarthur Rossetti, PA-C  amLODipine (NORVASC) 5 MG tablet Take 0.5 tablets (2.5 mg total) by mouth daily. 01/07/23 01/07/24  Arty Baumgartner, NP  amoxicillin (AMOXIL) 500 MG capsule Take 500 mg by mouth as needed (Use for pre dental procedure). Patient not taking: Reported on 01/11/2023    [provider]  apixaban (ELIQUIS) 2.5 MG TABS tablet Take 1 tablet (2.5 mg total) by mouth  2 (two) times daily. 09/03/22   Shelva Majestic, MD  diclofenac Sodium (VOLTAREN) 1 % GEL Apply 2 g topically 4 (four) times daily. 01/12/22   Angiulli, Mcarthur Rossetti, PA-C  ezetimibe (ZETIA) 10 MG tablet Take 1 tablet (10 mg total) by mouth daily. 05/07/22   Shelva Majestic, MD  ferrous sulfate 325 (65 FE) MG tablet Take 325 mg by mouth daily with breakfast.    [provider]  FLUoxetine (PROZAC) 10 MG capsule Take 1 capsule (10 mg total) by mouth daily. 11/19/22 11/19/23  Shelva Majestic, MD  furosemide (LASIX) 40 MG tablet Take 1 tablet (40 mg total) by mouth daily. 07/21/22   Shelva Majestic, MD  isosorbide mononitrate (IMDUR) 60 MG 24 hr tablet TAKE ONE TAB DAILY AFTER BREAKFAST. MAY TAKE AN ADDITONAL TAB IN EVENING X2DAYS IF USE NITROGLYCER Patient taking differently: Take 60 mg by mouth daily. TAKE ONE TAB DAILY AFTER BREAKFAST. MAY TAKE AN ADDITONAL TAB IN EVENING X2DAYS IF USE NITROGLYCER 11/12/22   Shelva Majestic, MD  Multiple Vitamins-Minerals (CENTRUM SILVER ADULT 50+) TABS Take 1 tablet by mouth daily.    [provider]  nitroGLYCERIN (NITROSTAT) 0.4 MG SL tablet ONE TABLET UNDER TONGUE WHEN NEEDED FOR CHEST PAIN. MAY REPEAT IN 5 MINUTES. 09/22/21   Marykay Lex, MD  rosuvastatin (CRESTOR) 10  MG tablet TAKE (1) TABLET DAILY AT BEDTIME. Patient taking differently: Take 10 mg by mouth daily. 09/23/22   Shelva Majestic, MD  topiramate (TOPAMAX) 100 MG tablet Take 1 tablet (100 mg total) by mouth 2 (two) times daily. 11/19/22   Kirsteins, Victorino Sparrow, MD  traMADol (ULTRAM) 50 MG tablet Take 1 tablet (50 mg total) by mouth every 8 (eight) hours as needed for up to 5 days. 01/14/23   Shelva Majestic, MD      Allergies    Patient has no known allergies.    Review of Systems   Review of Systems  Cardiovascular:  Positive for chest pain.    Physical Exam Updated Vital Signs BP (!) 168/88 (BP Location: Right Arm)   Pulse 77   Temp 97.6 F (36.4 C) (Oral)   Resp 12    Ht 5\' 8"  (1.727 m)   Wt 87 kg   SpO2 99%   BMI 29.16 kg/m  Physical Exam Constitutional: Alert and oriented. Well appearing and in no distress. Eyes: Conjunctivae are normal. ENT      Head: Normocephalic and atraumatic. Cardiovascular: S1, S2, regular rate, warm well-perfused.  Well-healing loop recorder scar no surrounding erythema or purulent drainage.  Mild bloody drainage. Respiratory: Normal respiratory effort. Breath sounds are normal.  O2 sat 100 on RA Gastrointestinal: Soft and nontender.  Musculoskeletal: Normal range of motion in all extremities. 2+ equal nontender pitting edema extending from feet to knee bilaterally Neurologic: Normal speech and language.  No facial droop.  5 out of 5 strength bilateral upper and lower extremities.  Sensation grossly intact.  No gross focal neurologic deficits are appreciated. Skin: Skin is warm, dry and intact. No rash noted. Psychiatric: Mood and affect are normal. Speech and behavior are normal.   ED Results / Procedures / Treatments   Labs (all labs ordered are listed, but only abnormal results are displayed) Labs Reviewed  COMPREHENSIVE METABOLIC PANEL  CBC WITH DIFFERENTIAL/PLATELET  BRAIN NATRIURETIC PEPTIDE  TROPONIN I (HIGH SENSITIVITY)  TROPONIN I (HIGH SENSITIVITY)    EKG EKG Interpretation  Date/Time:  Tuesday January 19 2023 14:52:07 EDT Ventricular Rate:  81 PR Interval:  303 QRS Duration: 97 QT Interval:  386 QTC Calculation: 448 R Axis:   -43 Text Interpretation: Sinus rhythm Atrial premature complex Prolonged PR interval LAD, consider LAFB or inferior infarct LVH with secondary repolarization abnormality nonspecific ST/T change inferior and lateral leads generally unchanged from prior Confirmed by Vivien Rossetti ([id number]) on 01/19/2023 3:05:45 PM  Radiology No results found.  Procedures Procedures    Medications Ordered in ED Medications - No data to display  ED Course/ Medical Decision Making/  A&P Clinical Course as of 01/19/23 1658  Tue Jan 19, 2023  1628 Spoke with [employer] team, they had an event that they activated today and will send over details of event, not classified as tachy-brady event or pause within parameters of loop recorder. [VB]    Clinical Course User Index [VB] Mardene Sayer, MD                             Medical Decision Making Dariel Caraker. is a 89 y.o. male. With pmh HTN, CAD s/p PCI, HTN, CKD, pAF on eliquis and diltiazem, AS s/p TAVR, migraine, and recurrent syncope presents after near syncope.   Reports note that patient was endorsing chest pain earlier given 2 sublingual nitroglycerin and concern for elevated  blood pressure however on arrival patient denying any active chest pain.  He has a EKG sinus rhythm with intermittent PACs and nonspecific ST minimal depressions inferior and lateral leads that is improved from prior.  He has no focal neurologic deficits concerning for CVA and no headache concerning for ICH.  He is well-appearing without chest pain and no pulse deficits or neurologic deficits, I have low suspicion and concern for dissection.  Will plan on pursuing cardiac workup with 2 times troponins and loop recorder interrogation to ensure no arrhythmia genic event as last time he had a 6-second sinus pause and admitted to the hospital.  Will also evaluate electrolytes to ensure no acute electrolyte abnormalities or renal dysfunction.  Signed out to dr Fredderick Phenix pending labs and reassessment.  Amount and/or Complexity of Data Reviewed Labs: ordered. Radiology: ordered.      Final Clinical Impression(s) / ED Diagnoses Final diagnoses:  Near syncope    Rx / DC Orders ED Discharge Orders     None         Mardene Sayer, MD 01/19/23 1658
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
## 2023-01-19 NOTE — ED Notes (Signed)
Lab contacted about delay in labs, relays will pull samples now an run them

## 2023-01-19 NOTE — Telephone Encounter (Signed)
Nurse is at patient house.  She states she wants an order to remove dressing.  Advised that I do not see that patient has a dressing.  He spoke with staff 5/31 which discussed monitoring the site and not removing steri strips. She states patient was told to take dressing off after 10 days but did not.  She states the patient has not removed the dressing and she cannot without an order.  Advised the patient was instructed to monitor sight and she states he is not as dressing in place.  Wanted me to give a verbal order from Dr Graciela Husbands to remove dressing.  Advised I would send the message to provider and she states "No I am at the home now and need it now. " Advised again I would send the message over, since the patient did not remove dressing and she will not, and now he will not either.  Please advise so I can call patient regarding dressing and site from loop recorder please

## 2023-01-19 NOTE — Telephone Encounter (Signed)
  Beth from [facility] called to give an update. She mentioned that the patient's blood pressure is still high, and he's experiencing chest pain and nausea with a blood pressure reading of 182/102. The patient took two nitroglycerin pills, and there's been a slight improvement in his chest pain. They've already called EMS, and the patient is currently being transported to [facility].

## 2023-01-19 NOTE — Telephone Encounter (Signed)
Lets see what the ER visit shows.

## 2023-01-19 NOTE — ED Provider Notes (Signed)
Care taken over from Dr. Elpidio Anis.  Patient presented after near syncopal type event.  He had a recent admission for syncope and at that time was having some sinus pauses.  His Cardizem is decreased.  Today he had had dizzy episode.  His blood pressure was actually a bit elevated at the time and he had an episode of chest pain with this.  He was given 2 nitroglycerin.  He has not had any further episodes of chest pain.  No symptoms since arrival to the ED.  His labs are reviewed.  His troponins are minimally elevated but his second 1 was lower than the first 1.  I did review with the loop recording and there is no pauses.  There is some ST depression but the EKG we have here in the ED appears nonconcerning.  I discussed the patient with Dr. Wyline Mood with cardiology.  She also reviewed the loop recordings and patient's chart and feels comfortable with the patient being discharged.  He will follow-up with cardiology.  Return precautions were given.  He is findings were discussed with the patient and family member at bedside.     Rolan Bucco, MD 01/19/23 1905

## 2023-01-19 NOTE — ED Notes (Signed)
Lab contacted 2nd time  about delay in labs, relays will pull samples now an run them

## 2023-01-19 NOTE — Telephone Encounter (Signed)
Calling to see when its okay to take his dressing off from his procedure that he had. Please advise

## 2023-01-19 NOTE — Telephone Encounter (Signed)
Received critical monitor alert  01/19/23 around 1620. The report was faxed to the [facility] office.

## 2023-01-19 NOTE — ED Triage Notes (Signed)
Pt to the ed from home via ems with a CC of chest pain with weakness. Pt was at home with home health nurse and complained of  chest pain. Nurse gave 2 nitros that helped relieve the pain. EMS relays bp went up after nitro. Pt has no current pain. Pt vomited on the was to the hospital.

## 2023-01-20 ENCOUNTER — Telehealth: Payer: Self-pay | Admitting: Family Medicine

## 2023-01-20 ENCOUNTER — Encounter: Payer: Self-pay | Admitting: Neurology

## 2023-01-20 ENCOUNTER — Ambulatory Visit: Payer: BLUE CROSS/BLUE SHIELD | Admitting: Nurse Practitioner

## 2023-01-20 ENCOUNTER — Ambulatory Visit (INDEPENDENT_AMBULATORY_CARE_PROVIDER_SITE_OTHER): Payer: Medicare Other | Admitting: Neurology

## 2023-01-20 VITALS — BP 144/68 | HR 76 | Ht 68.0 in | Wt 191.8 lb

## 2023-01-20 DIAGNOSIS — I6521 Occlusion and stenosis of right carotid artery: Secondary | ICD-10-CM | POA: Diagnosis not present

## 2023-01-20 DIAGNOSIS — I480 Paroxysmal atrial fibrillation: Secondary | ICD-10-CM

## 2023-01-20 DIAGNOSIS — I6381 Other cerebral infarction due to occlusion or stenosis of small artery: Secondary | ICD-10-CM | POA: Diagnosis not present

## 2023-01-20 DIAGNOSIS — G4761 Periodic limb movement disorder: Secondary | ICD-10-CM

## 2023-01-20 NOTE — Patient Instructions (Signed)
Continue Eliquis, statin and blood pressure medications as per cardiology Monitor the right carotid artery as per vascular surgery Follow up with me as needed

## 2023-01-20 NOTE — Telephone Encounter (Signed)
Caller states saw pt yesterday and he has a dermatitis type rash on stomach. Per caretaker it has been slightly itchy but it has been worse since discharge from hospital. Caller also states they sent pt to ED yesterday due to elevated BP reading, c/o chest pain, and feeling like going to pass out. Patient is currently home after being cleared at ED.

## 2023-01-20 NOTE — Telephone Encounter (Signed)
I am aware of Emergency Department visit- hope he is feeling better. We can try to work him in sooner than 01/29/23 if cancellations to look at rash or he could see a colleague to evaluate. Any new medications started prior to rash? Any fever, chills, headaches, illness outside of what was evaluated in Emergency Department? Any rash inside the mouth? -tried hydrocortisone OTC (available over the counter without a prescription) ?

## 2023-01-20 NOTE — Telephone Encounter (Signed)
FYI

## 2023-01-21 DIAGNOSIS — I480 Paroxysmal atrial fibrillation: Secondary | ICD-10-CM | POA: Diagnosis not present

## 2023-01-21 DIAGNOSIS — D631 Anemia in chronic kidney disease: Secondary | ICD-10-CM | POA: Diagnosis not present

## 2023-01-21 DIAGNOSIS — I5033 Acute on chronic diastolic (congestive) heart failure: Secondary | ICD-10-CM | POA: Diagnosis not present

## 2023-01-21 DIAGNOSIS — N184 Chronic kidney disease, stage 4 (severe): Secondary | ICD-10-CM | POA: Diagnosis not present

## 2023-01-21 DIAGNOSIS — I130 Hypertensive heart and chronic kidney disease with heart failure and stage 1 through stage 4 chronic kidney disease, or unspecified chronic kidney disease: Secondary | ICD-10-CM | POA: Diagnosis not present

## 2023-01-21 DIAGNOSIS — S50911A Unspecified superficial injury of right forearm, initial encounter: Secondary | ICD-10-CM | POA: Diagnosis not present

## 2023-01-21 NOTE — Telephone Encounter (Signed)
Called and lm for pt tcb. 

## 2023-01-22 ENCOUNTER — Telehealth: Payer: Self-pay

## 2023-01-22 NOTE — Telephone Encounter (Signed)
Beth RN with [name] requests Verbal Orders to continue seeing Patient 1 x per week for 4 more weeks.  Ph# [phone]

## 2023-01-22 NOTE — Telephone Encounter (Signed)
Transition Care Management Follow-up Telephone Call Date of discharge and from where: [facility] 6/11 How have you been since you were released from the hospital? I'm alive  Any questions or concerns? No  Items Reviewed: Did the pt receive and understand the discharge instructions provided? Yes  Medications obtained and verified? Yes  Other? No  Any new allergies since your discharge? No  Dietary orders reviewed? No Do you have support at home? Yes    Follow up appointments reviewed:  PCP Hospital f/u appt confirmed? Yes  Scheduled to see PCP on NEXT WEEK @ . Specialist Hospital f/u appt confirmed? No  Scheduled to see  on  @ . Are transportation arrangements needed? No  If their condition worsens, is the pt aware to call PCP or go to the Emergency Dept.? Yes Was the patient provided with contact information for the PCP's office or ED? Yes Was to pt encouraged to call back with questions or concerns? Yes

## 2023-01-22 NOTE — Telephone Encounter (Signed)
Called and spoke with [facility] and VO given, also gave below message. Beth was not sure of rash inside mouth, denies new medications and asked that we address this/other questions at pt visit on 06/18.

## 2023-01-25 DIAGNOSIS — D631 Anemia in chronic kidney disease: Secondary | ICD-10-CM | POA: Diagnosis not present

## 2023-01-25 DIAGNOSIS — I480 Paroxysmal atrial fibrillation: Secondary | ICD-10-CM | POA: Diagnosis not present

## 2023-01-25 DIAGNOSIS — I130 Hypertensive heart and chronic kidney disease with heart failure and stage 1 through stage 4 chronic kidney disease, or unspecified chronic kidney disease: Secondary | ICD-10-CM | POA: Diagnosis not present

## 2023-01-25 DIAGNOSIS — S50911A Unspecified superficial injury of right forearm, initial encounter: Secondary | ICD-10-CM | POA: Diagnosis not present

## 2023-01-25 DIAGNOSIS — N184 Chronic kidney disease, stage 4 (severe): Secondary | ICD-10-CM | POA: Diagnosis not present

## 2023-01-25 DIAGNOSIS — I5033 Acute on chronic diastolic (congestive) heart failure: Secondary | ICD-10-CM | POA: Diagnosis not present

## 2023-01-25 NOTE — Telephone Encounter (Signed)
Not sure what happed - no abnormal findings on ILR.    DH

## 2023-01-26 ENCOUNTER — Encounter: Payer: Medicare Other | Attending: Physical Medicine & Rehabilitation | Admitting: Physical Medicine & Rehabilitation

## 2023-01-26 ENCOUNTER — Ambulatory Visit: Payer: BLUE CROSS/BLUE SHIELD

## 2023-01-26 ENCOUNTER — Encounter: Payer: Self-pay | Admitting: Physical Medicine & Rehabilitation

## 2023-01-26 ENCOUNTER — Telehealth: Payer: Self-pay | Admitting: Family Medicine

## 2023-01-26 VITALS — BP 165/76 | HR 66 | Ht 68.0 in | Wt 190.0 lb

## 2023-01-26 DIAGNOSIS — I69398 Other sequelae of cerebral infarction: Secondary | ICD-10-CM

## 2023-01-26 DIAGNOSIS — I6381 Other cerebral infarction due to occlusion or stenosis of small artery: Secondary | ICD-10-CM

## 2023-01-26 DIAGNOSIS — R269 Unspecified abnormalities of gait and mobility: Secondary | ICD-10-CM

## 2023-01-26 NOTE — Telephone Encounter (Signed)
error 
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
## 2023-01-27 DIAGNOSIS — R2689 Other abnormalities of gait and mobility: Secondary | ICD-10-CM | POA: Diagnosis not present

## 2023-01-27 DIAGNOSIS — I700 Atherosclerosis of aorta: Secondary | ICD-10-CM | POA: Diagnosis not present

## 2023-01-27 DIAGNOSIS — I455 Other specified heart block: Secondary | ICD-10-CM | POA: Diagnosis not present

## 2023-01-27 DIAGNOSIS — I130 Hypertensive heart and chronic kidney disease with heart failure and stage 1 through stage 4 chronic kidney disease, or unspecified chronic kidney disease: Secondary | ICD-10-CM | POA: Diagnosis not present

## 2023-01-27 DIAGNOSIS — I5033 Acute on chronic diastolic (congestive) heart failure: Secondary | ICD-10-CM | POA: Diagnosis not present

## 2023-01-27 DIAGNOSIS — I495 Sick sinus syndrome: Secondary | ICD-10-CM | POA: Diagnosis not present

## 2023-01-27 DIAGNOSIS — S50911D Unspecified superficial injury of right forearm, subsequent encounter: Secondary | ICD-10-CM | POA: Diagnosis not present

## 2023-01-27 DIAGNOSIS — N184 Chronic kidney disease, stage 4 (severe): Secondary | ICD-10-CM | POA: Diagnosis not present

## 2023-01-27 DIAGNOSIS — I480 Paroxysmal atrial fibrillation: Secondary | ICD-10-CM | POA: Diagnosis not present

## 2023-01-27 DIAGNOSIS — M19071 Primary osteoarthritis, right ankle and foot: Secondary | ICD-10-CM | POA: Diagnosis not present

## 2023-01-27 DIAGNOSIS — M19011 Primary osteoarthritis, right shoulder: Secondary | ICD-10-CM | POA: Diagnosis not present

## 2023-01-27 DIAGNOSIS — I440 Atrioventricular block, first degree: Secondary | ICD-10-CM | POA: Diagnosis not present

## 2023-01-27 DIAGNOSIS — M47812 Spondylosis without myelopathy or radiculopathy, cervical region: Secondary | ICD-10-CM | POA: Diagnosis not present

## 2023-01-27 DIAGNOSIS — F32A Depression, unspecified: Secondary | ICD-10-CM | POA: Diagnosis not present

## 2023-01-27 DIAGNOSIS — M19041 Primary osteoarthritis, right hand: Secondary | ICD-10-CM | POA: Diagnosis not present

## 2023-01-27 DIAGNOSIS — I252 Old myocardial infarction: Secondary | ICD-10-CM | POA: Diagnosis not present

## 2023-01-27 DIAGNOSIS — M19072 Primary osteoarthritis, left ankle and foot: Secondary | ICD-10-CM | POA: Diagnosis not present

## 2023-01-27 DIAGNOSIS — G43909 Migraine, unspecified, not intractable, without status migrainosus: Secondary | ICD-10-CM | POA: Diagnosis not present

## 2023-01-27 DIAGNOSIS — S50912D Unspecified superficial injury of left forearm, subsequent encounter: Secondary | ICD-10-CM | POA: Diagnosis not present

## 2023-01-27 DIAGNOSIS — I69398 Other sequelae of cerebral infarction: Secondary | ICD-10-CM | POA: Diagnosis not present

## 2023-01-27 DIAGNOSIS — F419 Anxiety disorder, unspecified: Secondary | ICD-10-CM | POA: Diagnosis not present

## 2023-01-27 DIAGNOSIS — D631 Anemia in chronic kidney disease: Secondary | ICD-10-CM | POA: Diagnosis not present

## 2023-01-27 DIAGNOSIS — I2510 Atherosclerotic heart disease of native coronary artery without angina pectoris: Secondary | ICD-10-CM | POA: Diagnosis not present

## 2023-01-27 DIAGNOSIS — S2241XD Multiple fractures of ribs, right side, subsequent encounter for fracture with routine healing: Secondary | ICD-10-CM | POA: Diagnosis not present

## 2023-01-27 DIAGNOSIS — M19012 Primary osteoarthritis, left shoulder: Secondary | ICD-10-CM | POA: Diagnosis not present

## 2023-01-28 ENCOUNTER — Ambulatory Visit: Payer: BLUE CROSS/BLUE SHIELD | Admitting: Physical Medicine & Rehabilitation

## 2023-01-28 ENCOUNTER — Encounter: Payer: Self-pay | Admitting: Oncology

## 2023-01-28 DIAGNOSIS — I130 Hypertensive heart and chronic kidney disease with heart failure and stage 1 through stage 4 chronic kidney disease, or unspecified chronic kidney disease: Secondary | ICD-10-CM | POA: Diagnosis not present

## 2023-01-28 DIAGNOSIS — I440 Atrioventricular block, first degree: Secondary | ICD-10-CM | POA: Diagnosis not present

## 2023-01-28 DIAGNOSIS — I495 Sick sinus syndrome: Secondary | ICD-10-CM | POA: Diagnosis not present

## 2023-01-28 DIAGNOSIS — I455 Other specified heart block: Secondary | ICD-10-CM | POA: Diagnosis not present

## 2023-01-28 DIAGNOSIS — S50911D Unspecified superficial injury of right forearm, subsequent encounter: Secondary | ICD-10-CM | POA: Diagnosis not present

## 2023-01-28 DIAGNOSIS — S50912D Unspecified superficial injury of left forearm, subsequent encounter: Secondary | ICD-10-CM | POA: Diagnosis not present

## 2023-01-29 ENCOUNTER — Encounter: Payer: Self-pay | Admitting: Family Medicine

## 2023-01-29 ENCOUNTER — Ambulatory Visit (INDEPENDENT_AMBULATORY_CARE_PROVIDER_SITE_OTHER): Payer: Medicare Other | Admitting: Family Medicine

## 2023-01-29 VITALS — BP 138/78 | HR 68 | Ht 68.0 in | Wt 190.0 lb

## 2023-01-29 DIAGNOSIS — Z131 Encounter for screening for diabetes mellitus: Secondary | ICD-10-CM

## 2023-01-29 DIAGNOSIS — I25119 Atherosclerotic heart disease of native coronary artery with unspecified angina pectoris: Secondary | ICD-10-CM

## 2023-01-29 DIAGNOSIS — E785 Hyperlipidemia, unspecified: Secondary | ICD-10-CM | POA: Diagnosis not present

## 2023-01-29 DIAGNOSIS — I10 Essential (primary) hypertension: Secondary | ICD-10-CM | POA: Diagnosis not present

## 2023-01-29 DIAGNOSIS — R739 Hyperglycemia, unspecified: Secondary | ICD-10-CM

## 2023-01-29 DIAGNOSIS — E538 Deficiency of other specified B group vitamins: Secondary | ICD-10-CM | POA: Diagnosis not present

## 2023-01-29 DIAGNOSIS — N184 Chronic kidney disease, stage 4 (severe): Secondary | ICD-10-CM | POA: Diagnosis not present

## 2023-01-29 DIAGNOSIS — I480 Paroxysmal atrial fibrillation: Secondary | ICD-10-CM

## 2023-01-29 LAB — LIPID PANEL
Cholesterol: 104 mg/dL (ref 0–200)
HDL: 35.7 mg/dL — ABNORMAL LOW (ref >39.00)
LDL Cholesterol: 46 mg/dL (ref 0–99)
NonHDL: 68.25
Total CHOL/HDL Ratio: 3
Triglycerides: 113 mg/dL (ref 0.0–149.0)
VLDL: 22.6 mg/dL (ref 0.0–40.0)

## 2023-01-29 LAB — CBC WITH DIFFERENTIAL/PLATELET
Basophils Absolute: 0.1 10*3/uL (ref 0.0–0.1)
Basophils Relative: 0.6 % (ref 0.0–3.0)
Eosinophils Absolute: 0.4 10*3/uL (ref 0.0–0.7)
Eosinophils Relative: 3.9 % (ref 0.0–5.0)
HCT: 34.4 % — ABNORMAL LOW (ref 39.0–52.0)
Hemoglobin: 11.2 g/dL — ABNORMAL LOW (ref 13.0–17.0)
Lymphocytes Relative: 14.1 % (ref 12.0–46.0)
Lymphs Abs: 1.5 10*3/uL (ref 0.7–4.0)
MCHC: 32.7 g/dL (ref 30.0–36.0)
MCV: 102.2 fl — ABNORMAL HIGH (ref 78.0–100.0)
Monocytes Absolute: 1.2 10*3/uL — ABNORMAL HIGH (ref 0.1–1.0)
Monocytes Relative: 11.2 % (ref 3.0–12.0)
Neutro Abs: 7.5 10*3/uL (ref 1.4–7.7)
Neutrophils Relative %: 70.2 % (ref 43.0–77.0)
Platelets: 152 10*3/uL (ref 150.0–400.0)
RBC: 3.37 Mil/uL — ABNORMAL LOW (ref 4.22–5.81)
RDW: 12.9 % (ref 11.5–15.5)
WBC: 10.8 10*3/uL — ABNORMAL HIGH (ref 4.0–10.5)

## 2023-01-29 LAB — COMPREHENSIVE METABOLIC PANEL
ALT: 8 U/L (ref 0–53)
AST: 19 U/L (ref 0–37)
Albumin: 3.5 g/dL (ref 3.5–5.2)
Alkaline Phosphatase: 68 U/L (ref 39–117)
BUN: 53 mg/dL — ABNORMAL HIGH (ref 6–23)
CO2: 24 mEq/L (ref 19–32)
Calcium: 8.3 mg/dL — ABNORMAL LOW (ref 8.4–10.5)
Chloride: 107 mEq/L (ref 96–112)
Creatinine, Ser: 2.77 mg/dL — ABNORMAL HIGH (ref 0.40–1.50)
GFR: 19.51 mL/min — ABNORMAL LOW (ref >60.00)
Glucose, Bld: 89 mg/dL (ref 70–99)
Potassium: 4.5 mEq/L (ref 3.5–5.1)
Sodium: 139 mEq/L (ref 135–145)
Total Bilirubin: 0.4 mg/dL (ref 0.2–1.2)
Total Protein: 6.5 g/dL (ref 6.0–8.3)

## 2023-01-29 LAB — HEMOGLOBIN A1C: Hgb A1c MFr Bld: 5.9 % (ref 4.6–6.5)

## 2023-01-29 MED ORDER — CYANOCOBALAMIN 1000 MCG/ML IJ SOLN
1000.0000 ug | Freq: Once | INTRAMUSCULAR | Status: AC
Start: 2023-01-29 — End: 2023-01-29
  Administered 2023-01-29: 1000 ug via INTRAMUSCULAR

## 2023-01-29 NOTE — Progress Notes (Signed)
Cyanocobalamin given IM left deltoid.  Patient tolerated well.

## 2023-01-29 NOTE — Patient Instructions (Addendum)
Please stop by lab before you go If you have mychart- we will send your results within 3 business days of [place] receiving them.  If you do not have mychart- we will call you about results within 5 business days of [place] receiving them.  *please also note that you will see labs on mychart as soon as they post. I will later go in and write notes on them- will say "notes from Dr. Durene Cal"   Recommended follow up: Return in about 3 months (around 05/01/2023) for followup or sooner if needed.Schedule b4 you leave.

## 2023-01-29 NOTE — Progress Notes (Signed)
Phone [phone] In person visit   Subjective:   Mark Jackson. is a 89 y.o. year old very pleasant male patient who presents for/with See problem oriented charting Chief Complaint  Patient presents with   Medical Management of Chronic Issues    RM 7   3 month follow up, B12 injection given   Past Medical History-  Patient Active Problem List   Diagnosis Date Noted   Right hemiparesis (HCC) 03/05/2022    Priority: High   S/P TAVR (transcatheter aortic valve replacement) 12/12/2019    Priority: High   CAD S/P DES PCI to proximal LAD 03/22/2015    Priority: High   PAF (paroxysmal atrial fibrillation) (HCC) 08/20/2014    Priority: High    Class: Diagnosis of   Chronic kidney disease (CKD), stage IV (severe) (HCC) 08/20/2014    Priority: High   Personal history of prostate cancer 10/22/2008    Priority: High   Hyperglycemia 09/27/2017    Priority: Medium    B12 deficiency 01/06/2017    Priority: Medium    BPH associated with nocturia 06/15/2016    Priority: Medium    Aortic stenosis 03/14/2015    Priority: Medium    Hereditary and idiopathic peripheral neuropathy 01/12/2014    Priority: Medium    H/O syncope 09/03/2013    Priority: Medium    Right-sided carotid artery disease (HCC) 03/02/2013    Priority: Medium    Hyperlipidemia with target LDL less than 70 03/02/2013    Priority: Medium    Migraine without aura 10/26/2012    Priority: Medium    Macrocytic anemia 10/23/2008    Priority: Medium    Essential hypertension 10/22/2008    Priority: Medium    Excessive daytime sleepiness 10/22/2008    Priority: Medium    Thrombocytopenia, unspecified (HCC) 12/11/2021    Priority: Low   Syncope 11/27/2019    Priority: Low   Fatigue 11/08/2017    Priority: Low   Chronic diarrhea 01/06/2017    Priority: Low   Perianal dermatitis 06/19/2015    Priority: Low   Rectal bleeding 04/25/2015    Priority: Low   Dyspnea 08/31/2014    Priority: Low   Long term  current use of anticoagulant therapy: CHA2DS2-VASc 7 -> Eliquis 2.5 mg twice daily 08/27/2014    Priority: Low   GLAUCOMA 10/23/2008    Priority: Low   Hemorrhoids 10/22/2008    Priority: Low   Arthropathy 10/22/2008    Priority: Low   History of colonic polyps 10/22/2008    Priority: Low   Sinus pause 01/05/2023   Carotid stenosis, right 12/25/2022   Demand ischemia 12/24/2022   Genetic testing 12/22/2022   Gait disturbance, post-stroke 07/28/2022   B12 neuropathy (HCC) 07/28/2022   Situational depression 03/05/2022   Loud snoring 02/04/2022   Left thalamic infarction (HCC) 12/29/2021   Hematuria 12/11/2020   Bilateral lower extremity edema 04/15/2017   Crescendo angina (HCC) 06/01/2016    Medications- reviewed and updated Current Outpatient Medications  Medication Sig Dispense Refill   acetaminophen (TYLENOL) 325 MG tablet Take 2 tablets (650 mg total) by mouth every 4 (four) hours as needed for mild pain (or temp > 37.5 C (99.5 F)).     amLODipine (NORVASC) 5 MG tablet Take 0.5 tablets (2.5 mg total) by mouth daily. 30 tablet 11   amoxicillin (AMOXIL) 500 MG capsule Take 500 mg by mouth as needed (Use for pre dental procedure).     apixaban (ELIQUIS) 2.5 MG TABS  tablet Take 1 tablet (2.5 mg total) by mouth 2 (two) times daily. 180 tablet 3   diclofenac Sodium (VOLTAREN) 1 % GEL Apply 2 g topically 4 (four) times daily. 2 g 0   ezetimibe (ZETIA) 10 MG tablet Take 1 tablet (10 mg total) by mouth daily. 90 tablet 3   ferrous sulfate 325 (65 FE) MG tablet Take 325 mg by mouth daily with breakfast.     FLUoxetine (PROZAC) 10 MG capsule Take 1 capsule (10 mg total) by mouth daily. 30 capsule 2   furosemide (LASIX) 40 MG tablet Take 1 tablet (40 mg total) by mouth daily. 90 tablet 3   isosorbide mononitrate (IMDUR) 60 MG 24 hr tablet TAKE ONE TAB DAILY AFTER BREAKFAST. MAY TAKE AN ADDITONAL TAB IN EVENING X2DAYS IF USE NITROGLYCER (Patient taking differently: Take 60 mg by mouth  daily. TAKE ONE TAB DAILY AFTER BREAKFAST. MAY TAKE AN ADDITONAL TAB IN EVENING X2DAYS IF USE NITROGLYCER) 120 tablet 3   Multiple Vitamins-Minerals (CENTRUM SILVER ADULT 50+) TABS Take 1 tablet by mouth daily.     nitroGLYCERIN (NITROSTAT) 0.4 MG SL tablet ONE TABLET UNDER TONGUE WHEN NEEDED FOR CHEST PAIN. MAY REPEAT IN 5 MINUTES. 25 tablet 0   rosuvastatin (CRESTOR) 20 MG tablet Take 20 mg by mouth at bedtime.     topiramate (TOPAMAX) 100 MG tablet Take 1 tablet (100 mg total) by mouth 2 (two) times daily. 60 tablet 3   traMADol (ULTRAM) 50 MG tablet Take 1 tablet (50 mg total) by mouth every 8 (eight) hours as needed for up to 5 days. 15 tablet 0   rosuvastatin (CRESTOR) 10 MG tablet TAKE (1) TABLET DAILY AT BEDTIME. (Patient not taking: Reported on 01/29/2023) 90 tablet 3   No current facility-administered medications for this visit.     Objective:  BP 138/78   Pulse 68   Ht 5\' 8"  (1.727 m)   Wt 190 lb (86.2 kg)   SpO2 98%   BMI 28.89 kg/m  Gen: NAD, resting comfortably CV: RRR no murmurs rubs or gallops Lungs: CTAB no crackles, wheeze, rhonchi Abdomen: soft/nontender/nondistended/normal bowel sounds. No rebound or guarding.  Ext: 1+ edema Skin: warm, dry     Assessment and Plan   #social update- new great grandchildren #6 and [address] on the way in November and january  #CAD status post DES PCI to proximal LAD- sees Dr. Herbie Baltimore #History of CVA-Dec 26 2021 hospitalization with right hemiparesis (left thalamic infarction and small left cerebellar infarct thalamic CVA most likely SVD although 2 locations suggests cardioembolic) #hyperlipidemia #Right internal carotid artery stenosis- 80-99% narrowed S: Medication: Aspirin was not added as patient already on Eliquis during May 2023 stroke hospitalization Crestor 20 mg-->10 mg (Due to renal function), Zetia 10 mg added after May 2023 stroke -recent sinus pauses thought cause of presyncope- zio patch started which led high grade AV  block- and later had loop recorder implanted. May need pacemaker in long run - no chest pain. Stable shortness of breath- weak in general- uses walker Lab Results  Component Value Date   CHOL 132 12/27/2021   HDL 39 (L) 12/27/2021   LDLCALC 80 12/27/2021   LDLDIRECT 38.0 07/16/2022   TRIG 63 12/27/2021   CHOLHDL 3.4 12/27/2021   A/P: CAD without chest pain but stable shortness of breath- continue current medications  History of CVA likely emoblic- remain on eliquis Lipids - hopefully ok after reduction- I will check full lipid panel. Really want to control with carotid  stenosis noted at above 80%- vascular noted on discharge summary to suggest no intervention (was there for syncope/falls)   # Atrial fibrillation S: Rate controlled with diltiazem 240mg  XR in past now without medications-due to sinus pauses Anticoagulated with eliquis 2.5 mg BID A/P: appropriately anticoagulated and rate controlled (without medications)- continue current medicine   #CKD IV- sees Dr. Albesa Seen - no dialysis planned #hypertension S: medication: amlodipine 2.5 mg, lasix 40 mg daily, imdur 60 mg -zippered compression stockings ordered yesterday- others are too hard to get on even with the caddy Home readings #s: tends to run high at home BP Readings from Last 3 Encounters:  01/29/23 138/78  01/26/23 (!) 165/76  01/20/23 (!) 144/68  A/P: high aacceptable today but as Dr. Herbie Baltimore has noted- focus is to avoid lows- he has been ok with anything between 110-170 even to help prevent falls- I think that's reasonable- continue current medications   CKD IV_ stable on labs but recheck outside of ED  # B12 deficiency S: Current treatment/medication (oral vs. IM): injection today  Lab Results  Component Value Date   VITAMINB12 533 03/05/2022  A/P: stable- continue current medicines    # Depression S: Medication:fluoxetine 10 mg     01/26/2023   12:46 PM 12/29/2022    1:55 PM 11/26/2022   11:47 AM   Depression screen PHQ 2/9  Decreased Interest 0 1 1  Down, Depressed, Hopeless 0 0 1  PHQ - 2 Score 0 1 2  Altered sleeping  1 3  Tired, decreased energy  1 0  Change in appetite  1 0  Feeling bad or failure about yourself   1 0  Trouble concentrating  1 0  Moving slowly or fidgety/restless  0 0  Suicidal thoughts  0 0  PHQ-9 Score  6 5  Difficult doing work/chores  Not difficult at all   A/P: reasonable control especially in light of medical illness and not getting level of improvement he had hoped for compared to a year ago- but has had a lot of roadblocks and keeps fithing  # Hyperglycemia/insulin resistance/prediabetes S:  Medication: none Lab Results  Component Value Date   HGBA1C 5.8 (H) 12/27/2021   HGBA1C 5.8 12/11/2021   HGBA1C 5.6 07/19/2020   A/P: hopefully stable- update a1c today. Continue without meds for now   #hematology- has seen them last August for anemia and thrombocytopenia  #diffuse itching- has tweaked diet and helped but not eliminated- was told allergic to acid years ago- he has tried to reduce foods that could cause acidity  Recommended follow up: Return in about 3 months (around 05/01/2023) for followup or sooner if needed.Schedule b4 you leave. Future Appointments  Date Time Provider Department Center  02/08/2023 12:45 PM Fleeta Emmer, RN [facility] None  02/09/2023  7:05 AM [facility] DEVICE REMOTES [facility]  03/10/2023  2:00 PM Lanier Prude, MD [facility]  03/11/2023  2:00 PM Willa Frater L, RPH [facility] None  03/12/2023 11:15 AM [facility] LAB [facility] None  03/12/2023 11:45 AM Loa Socks, NP [facility] None  03/15/2023  7:25 AM [facility] DEVICE REMOTES [facility]  03/24/2023 11:20 AM Joylene Grapes, NP [facility] None  03/30/2023 11:30 AM [facility] HS VASC 5 [facility]  03/30/2023 12:40 PM Leonie Douglas, MD [facility]  04/19/2023  7:25 AM [facility] DEVICE REMOTES [facility]  [facility]  05/24/2023  7:20 AM [facility] DEVICE REMOTES [facility]  06/28/2023  7:20 AM [facility] DEVICE REMOTES [facility]  08/02/2023  7:15 AM [facility] DEVICE REMOTES [facility]  09/06/2023  7:00 AM [facility] DEVICE REMOTES [facility]  10/11/2023  7:05 AM [facility] DEVICE REMOTES [facility]  11/15/2023  7:10 AM [facility] DEVICE REMOTES [facility]  12/02/2023 11:30 AM [facility] ANNUAL WELLNESS VISIT 1 [facility] PEC  12/20/2023  7:05 AM [facility] DEVICE REMOTES [facility]   Lab/Order associations:   ICD-10-CM   1. PAF (paroxysmal atrial fibrillation) (HCC)  I48.0     2. Chronic kidney disease (CKD), stage IV (severe) (HCC)  N18.4     3. CAD S/P DES PCI to proximal LAD  I25.119     4. Essential hypertension  I10     5. Hyperlipidemia with target LDL less than 70  E78.5     6. Hyperglycemia  R73.9     7. B12 deficiency  E53.8 cyanocobalamin (VITAMIN B12) injection 1,000 mcg      Meds ordered this encounter  Medications   cyanocobalamin (VITAMIN B12) injection 1,000 mcg    Return precautions advised.  Tana Conch, MD

## 2023-02-02 ENCOUNTER — Telehealth: Payer: Self-pay | Admitting: Family Medicine

## 2023-02-02 DIAGNOSIS — I455 Other specified heart block: Secondary | ICD-10-CM | POA: Diagnosis not present

## 2023-02-02 DIAGNOSIS — I440 Atrioventricular block, first degree: Secondary | ICD-10-CM | POA: Diagnosis not present

## 2023-02-02 DIAGNOSIS — S50911D Unspecified superficial injury of right forearm, subsequent encounter: Secondary | ICD-10-CM | POA: Diagnosis not present

## 2023-02-02 DIAGNOSIS — S50912D Unspecified superficial injury of left forearm, subsequent encounter: Secondary | ICD-10-CM | POA: Diagnosis not present

## 2023-02-02 DIAGNOSIS — I130 Hypertensive heart and chronic kidney disease with heart failure and stage 1 through stage 4 chronic kidney disease, or unspecified chronic kidney disease: Secondary | ICD-10-CM | POA: Diagnosis not present

## 2023-02-02 DIAGNOSIS — I495 Sick sinus syndrome: Secondary | ICD-10-CM | POA: Diagnosis not present

## 2023-02-02 NOTE — Telephone Encounter (Signed)
See below

## 2023-02-02 NOTE — Telephone Encounter (Signed)
Home Health Verbal Orders  Agency:  [name]  Caller:  Contractor and title  Requesting OT/ PT/ Skilled nursing/ Social Work/ Speech:    Reason for Request:  New small open area on left shin,, he scratched himself, is it ok to do a calcium alginate with silver and a foam dressing. A tone of itching and is getting worse after hospital. Might need something for itching - RXed  Frequency:    HH needs F2F w/in last 30 days     [phone]

## 2023-02-03 NOTE — Telephone Encounter (Signed)
Yes thanks-can give verbal orders for the requested treatment

## 2023-02-04 NOTE — Telephone Encounter (Signed)
Called and left detailed message on Beth secure vm with VO.

## 2023-02-08 ENCOUNTER — Encounter: Payer: Self-pay | Admitting: Oncology

## 2023-02-08 ENCOUNTER — Telehealth: Payer: Self-pay | Admitting: Family Medicine

## 2023-02-08 ENCOUNTER — Ambulatory Visit: Payer: Self-pay

## 2023-02-08 ENCOUNTER — Telehealth: Payer: Self-pay | Admitting: Cardiology

## 2023-02-08 DIAGNOSIS — I495 Sick sinus syndrome: Secondary | ICD-10-CM | POA: Diagnosis not present

## 2023-02-08 DIAGNOSIS — I455 Other specified heart block: Secondary | ICD-10-CM | POA: Diagnosis not present

## 2023-02-08 DIAGNOSIS — I130 Hypertensive heart and chronic kidney disease with heart failure and stage 1 through stage 4 chronic kidney disease, or unspecified chronic kidney disease: Secondary | ICD-10-CM | POA: Diagnosis not present

## 2023-02-08 DIAGNOSIS — S50911D Unspecified superficial injury of right forearm, subsequent encounter: Secondary | ICD-10-CM | POA: Diagnosis not present

## 2023-02-08 DIAGNOSIS — S50912D Unspecified superficial injury of left forearm, subsequent encounter: Secondary | ICD-10-CM | POA: Diagnosis not present

## 2023-02-08 DIAGNOSIS — I440 Atrioventricular block, first degree: Secondary | ICD-10-CM | POA: Diagnosis not present

## 2023-02-08 NOTE — Patient Outreach (Signed)
  Care Coordination   Follow Up Visit Note   02/08/2023 Name: Mark Jackson. MRN: [id number] DOB: June 22, 1930  Mark Jackson. is a 88 y.o. year old male who sees Hunter, Aldine Contes, MD for primary care. I spoke with  Mark Jackson. by phone today.  What matters to the patients health and wellness today?  Managing health    Goals Addressed             This Visit's Progress    A,Fib managment       Care Coordination Interventions: Counseled on increased risk of stroke due to Afib and benefits of anticoagulation for stroke prevention Reviewed importance of adherence to anticoagulant exactly as prescribed  Patient doing well. Implantable loop recorder site still bandage over. No signs of infection around site.  He verbalized understanding.  Weight 183 lbs. Last blood pressure 120/70.  No syncope episodes.  Discussed HTN management.  No concerns.         SDOH assessments and interventions completed:  Yes     Care Coordination Interventions:  Yes, provided   Follow up plan: Follow up call scheduled for 03/11/23    Encounter Outcome:  Pt. Visit Completed   Bary Leriche, RN, MSN [facility] Care Management Care Management Coordinator Direct Line [phone]

## 2023-02-08 NOTE — Telephone Encounter (Signed)
FYI

## 2023-02-08 NOTE — Telephone Encounter (Signed)
I will message EP to see if they can download data from that time period. DH

## 2023-02-08 NOTE — Telephone Encounter (Signed)
Call to [facility] with Mark Jackson.  LM we are aware of his BP and this number is good as we are working to keep it lower.  Advised the PCP was notified aboout the fall, as he is the one to notify.  The PCP has sent a message to Dr Herbie Baltimore as well and they will discuss POC. Nothing further for [name] to do.

## 2023-02-08 NOTE — Telephone Encounter (Signed)
Home Health Verbal Orders  Agency:  [facility]  Caller:  Beckey Downing and title  Requesting OT/ PT/ Skilled nursing/ Social Work/ Speech:    Reason for Request:  Fall last week, on the 26th, no injuries, unclear if he slipped in shower or if syncope was involved.  Frequency:    HH needs F2F w/in last 30 days     [phone]

## 2023-02-08 NOTE — Patient Instructions (Signed)
Visit Information  Thank you for taking time to visit with me today. Please don't hesitate to contact me if I can be of assistance to you.   Following are the goals we discussed today:   Goals Addressed             This Visit's Progress    A,Fib managment       Care Coordination Interventions: Counseled on increased risk of stroke due to Afib and benefits of anticoagulation for stroke prevention Reviewed importance of adherence to anticoagulant exactly as prescribed  Patient doing well. Implantable loop recorder site still bandage over. No signs of infection around site.  He verbalized understanding.  Weight 183 lbs. Last blood pressure 120/70.  No syncope episodes.  Discussed HTN management.  No concerns.         Our next appointment is by telephone on 03/11/23 at 100 pm  Please call the care guide team at [phone] if you need to cancel or reschedule your appointment.   If you are experiencing a Mental Health or Behavioral Health Crisis or need someone to talk to, please call the Suicide and Crisis Lifeline: 988   Patient verbalizes understanding of instructions and care plan provided today and agrees to view in MyChart. Active MyChart status and patient understanding of how to access instructions and care plan via MyChart confirmed with patient.     The patient has been provided with contact information for the care management team and has been advised to call with any health related questions or concerns.   Bary Leriche, RN, MSN [facility] Care Management Care Management Coordinator Direct Line [phone]

## 2023-02-08 NOTE — Telephone Encounter (Signed)
Dr. Herbie Baltimore- with his loop recorder is it possible to tell if any events occurred at time of his fall since there was a question of syncope- I suspect it was a slip given no injury but just wanted your thoughts as I know pacemaker has been considered

## 2023-02-08 NOTE — Telephone Encounter (Signed)
147/92 sitting 161/87 standing No dizziness Fell last week on 02/03/23 in shower; not sure if he sleeping or if it was due to syncope. Too BP afterwards and it was 120/70 which was low for patient. Call Threasa Alpha (PT with [facility]) at [phone]

## 2023-02-09 ENCOUNTER — Ambulatory Visit (INDEPENDENT_AMBULATORY_CARE_PROVIDER_SITE_OTHER): Payer: Medicare Other

## 2023-02-09 DIAGNOSIS — I455 Other specified heart block: Secondary | ICD-10-CM

## 2023-02-09 NOTE — Telephone Encounter (Signed)
Great news on no episodes- please call patient and check on him team- sounds more like a slip- anything he needs for support to help avoid?

## 2023-02-09 NOTE — Telephone Encounter (Signed)
Called and lm for pt tcb. 

## 2023-02-09 NOTE — Telephone Encounter (Signed)
ILR checked and no episodes triggered. Trends appear stable. Routing to MD's to advise.

## 2023-02-10 ENCOUNTER — Telehealth: Payer: Self-pay | Admitting: Family Medicine

## 2023-02-10 DIAGNOSIS — I495 Sick sinus syndrome: Secondary | ICD-10-CM | POA: Diagnosis not present

## 2023-02-10 DIAGNOSIS — S50911D Unspecified superficial injury of right forearm, subsequent encounter: Secondary | ICD-10-CM | POA: Diagnosis not present

## 2023-02-10 DIAGNOSIS — I455 Other specified heart block: Secondary | ICD-10-CM | POA: Diagnosis not present

## 2023-02-10 DIAGNOSIS — S50912D Unspecified superficial injury of left forearm, subsequent encounter: Secondary | ICD-10-CM | POA: Diagnosis not present

## 2023-02-10 DIAGNOSIS — I440 Atrioventricular block, first degree: Secondary | ICD-10-CM | POA: Diagnosis not present

## 2023-02-10 DIAGNOSIS — I130 Hypertensive heart and chronic kidney disease with heart failure and stage 1 through stage 4 chronic kidney disease, or unspecified chronic kidney disease: Secondary | ICD-10-CM | POA: Diagnosis not present

## 2023-02-10 LAB — CUP PACEART REMOTE DEVICE CHECK
Date Time Interrogation Session: 20240702160322
Implantable Pulse Generator Implant Date: 20240530

## 2023-02-10 NOTE — Telephone Encounter (Signed)
Please schedule ov with available provider to be evaluated.

## 2023-02-10 NOTE — Telephone Encounter (Signed)
Beth RN with [facility] states Patient is complaining of new fecal incontinence over the past week-loose stools since stroke but have increased in frequency.  States caregiver told Beth Patient's stools are dark in color.

## 2023-02-12 ENCOUNTER — Ambulatory Visit: Payer: Medicare Other | Admitting: Internal Medicine

## 2023-02-15 DIAGNOSIS — I440 Atrioventricular block, first degree: Secondary | ICD-10-CM | POA: Diagnosis not present

## 2023-02-15 DIAGNOSIS — I495 Sick sinus syndrome: Secondary | ICD-10-CM | POA: Diagnosis not present

## 2023-02-15 DIAGNOSIS — I130 Hypertensive heart and chronic kidney disease with heart failure and stage 1 through stage 4 chronic kidney disease, or unspecified chronic kidney disease: Secondary | ICD-10-CM | POA: Diagnosis not present

## 2023-02-15 DIAGNOSIS — S50912D Unspecified superficial injury of left forearm, subsequent encounter: Secondary | ICD-10-CM | POA: Diagnosis not present

## 2023-02-15 DIAGNOSIS — I455 Other specified heart block: Secondary | ICD-10-CM | POA: Diagnosis not present

## 2023-02-15 DIAGNOSIS — S50911D Unspecified superficial injury of right forearm, subsequent encounter: Secondary | ICD-10-CM | POA: Diagnosis not present

## 2023-02-18 DIAGNOSIS — S50911D Unspecified superficial injury of right forearm, subsequent encounter: Secondary | ICD-10-CM | POA: Diagnosis not present

## 2023-02-18 DIAGNOSIS — I130 Hypertensive heart and chronic kidney disease with heart failure and stage 1 through stage 4 chronic kidney disease, or unspecified chronic kidney disease: Secondary | ICD-10-CM | POA: Diagnosis not present

## 2023-02-18 DIAGNOSIS — S50912D Unspecified superficial injury of left forearm, subsequent encounter: Secondary | ICD-10-CM | POA: Diagnosis not present

## 2023-02-18 DIAGNOSIS — I495 Sick sinus syndrome: Secondary | ICD-10-CM | POA: Diagnosis not present

## 2023-02-18 DIAGNOSIS — I455 Other specified heart block: Secondary | ICD-10-CM | POA: Diagnosis not present

## 2023-02-18 DIAGNOSIS — I440 Atrioventricular block, first degree: Secondary | ICD-10-CM | POA: Diagnosis not present

## 2023-02-23 ENCOUNTER — Telehealth: Payer: Self-pay | Admitting: Family Medicine

## 2023-02-23 DIAGNOSIS — I130 Hypertensive heart and chronic kidney disease with heart failure and stage 1 through stage 4 chronic kidney disease, or unspecified chronic kidney disease: Secondary | ICD-10-CM | POA: Diagnosis not present

## 2023-02-23 DIAGNOSIS — S50911D Unspecified superficial injury of right forearm, subsequent encounter: Secondary | ICD-10-CM | POA: Diagnosis not present

## 2023-02-23 DIAGNOSIS — I440 Atrioventricular block, first degree: Secondary | ICD-10-CM | POA: Diagnosis not present

## 2023-02-23 DIAGNOSIS — I495 Sick sinus syndrome: Secondary | ICD-10-CM | POA: Diagnosis not present

## 2023-02-23 DIAGNOSIS — I455 Other specified heart block: Secondary | ICD-10-CM | POA: Diagnosis not present

## 2023-02-23 DIAGNOSIS — S50912D Unspecified superficial injury of left forearm, subsequent encounter: Secondary | ICD-10-CM | POA: Diagnosis not present

## 2023-02-23 NOTE — Telephone Encounter (Signed)
Caller is [name], PT w/ [facility]. States patient is doing well and will be discharged from [facility] services.

## 2023-02-23 NOTE — Telephone Encounter (Signed)
 Noted  

## 2023-02-26 ENCOUNTER — Encounter: Payer: Self-pay | Admitting: Pharmacist

## 2023-02-26 NOTE — Progress Notes (Signed)
Patient previously followed by [facility] pharmacist. Per clinical review, no pharmacist appointment needed at this time.

## 2023-02-28 ENCOUNTER — Other Ambulatory Visit: Payer: Self-pay | Admitting: Family Medicine

## 2023-03-02 ENCOUNTER — Ambulatory Visit (INDEPENDENT_AMBULATORY_CARE_PROVIDER_SITE_OTHER): Payer: Medicare Other

## 2023-03-02 DIAGNOSIS — E538 Deficiency of other specified B group vitamins: Secondary | ICD-10-CM | POA: Diagnosis not present

## 2023-03-02 MED ORDER — CYANOCOBALAMIN 1000 MCG/ML IJ SOLN
1000.0000 ug | Freq: Once | INTRAMUSCULAR | Status: AC
Start: 2023-03-02 — End: 2023-03-02
  Administered 2023-03-02: 1000 ug via INTRAMUSCULAR

## 2023-03-02 NOTE — Progress Notes (Signed)
Pt tolerated b12 injection well. 

## 2023-03-05 NOTE — Progress Notes (Signed)
Carelink Summary Report / Loop Recorder 

## 2023-03-09 ENCOUNTER — Other Ambulatory Visit: Payer: Self-pay | Admitting: *Deleted

## 2023-03-09 DIAGNOSIS — I6521 Occlusion and stenosis of right carotid artery: Secondary | ICD-10-CM

## 2023-03-10 ENCOUNTER — Encounter: Payer: Self-pay | Admitting: Cardiology

## 2023-03-10 ENCOUNTER — Ambulatory Visit: Payer: Medicare Other | Attending: Cardiology | Admitting: Cardiology

## 2023-03-10 VITALS — BP 150/80 | HR 78 | Ht 68.0 in | Wt 191.0 lb

## 2023-03-10 DIAGNOSIS — I455 Other specified heart block: Secondary | ICD-10-CM | POA: Diagnosis present

## 2023-03-10 DIAGNOSIS — I480 Paroxysmal atrial fibrillation: Secondary | ICD-10-CM | POA: Insufficient documentation

## 2023-03-10 MED ORDER — ISOSORBIDE MONONITRATE ER 60 MG PO TB24: 60.0000 mg | ORAL_TABLET | Freq: Every day | ORAL | 3 refills | Status: DC

## 2023-03-10 MED ORDER — ISOSORBIDE MONONITRATE ER 60 MG PO TB24: 90.0000 mg | ORAL_TABLET | Freq: Every day | ORAL | 3 refills | Status: DC

## 2023-03-10 NOTE — Progress Notes (Signed)
  Electrophysiology Office Follow up Visit Note:    Date:  03/10/2023   ID:  Mark Pao., DOB October 17, 1929, MRN [id number]  PCP:  Shelva Majestic, MD  [facility] Cardiologist:  Bryan Lemma, MD  [facility] Electrophysiologist:  Lanier Prude, MD    Interval History:    Mark Vallis. is a 89 y.o. male who presents for a follow up visit.   I last saw Mark Jackson Jan 05, 2023 for frequent falls and atrial fibrillation.  At that appointment I did not think he was a candidate for left atrial appendage occlusion.  He was admitted to [facility] hospital on May 29 for sinus pause observed on heart rhythm monitor.  His diltiazem was stopped.  A loop recorder was implanted.  He again presented to [facility] emergency department on June 11 for near syncope.  There were no abnormal heart rhythms recorded around Mark time of his lightheadedness.  He is with his daughter today in clinic who I have previously met.  He did have an episode of chest pressure.  It lasted for several minutes before he took nitroglycerin.  After couple of minutes, Mark chest pressure resolved and has not recurred.  His blood pressure was elevated this morning and his caregiver had to give him an extra dose of amlodipine.      Past medical, surgical, social and family history were reviewed.  ROS:   Please see Mark history of present illness.    All other systems reviewed and are negative.  EKGs/Labs/Other Studies Reviewed:    Mark following studies were reviewed today:  Loop recorder interrogations       Physical Exam:    VS:  BP (!) 150/80 (BP Location: Right Arm, Jackson Position: Sitting, Cuff Size: Large)   Pulse 78   Ht 5\' 8"  (1.727 m)   Wt 191 lb (86.6 kg)   SpO2 98%   BMI 29.04 kg/m     Wt Readings from Last 3 Encounters:  03/10/23 191 lb (86.6 kg)  01/29/23 190 lb (86.2 kg)  01/26/23 190 lb (86.2 kg)     GEN:  Well nourished, well developed in no acute distress CARDIAC: RRR, no murmurs,  rubs, gallops RESPIRATORY:  Clear to auscultation without rales, wheezing or rhonchi       ASSESSMENT:    1. Sinus pause   2. Paroxysmal atrial fibrillation (HCC)    PLAN:    In order of problems listed above:  #Chest pressure Required nitroglycerin this morning.  I will increase his Imdur to 90 mg by mouth once daily #Sinus pause Short episodes have been observed on heart rhythm monitoring.  No prolonged episodes that would account for his symptoms.  Avoid nodal blockers.  #Dizziness Recurrent.  Does not appear to be clearly associated with sinus pauses, bradycardia or tachycardia.  Recommend staying adequately hydrated.  Recommend compression stockings at least to Mark knee.    #Atrial fibrillation Continue Eliquis for stroke prophylaxis.  #Hypertension Above goal today.  Recommend checking blood pressures 1-2 times per week at home and recording Mark values.  Recommend bringing these recordings to Mark primary care physician. Continue amlodipine. Increase Imdur to 90 mg by mouth once daily      Signed, Steffanie Dunn, MD, [facility], [facility] 03/10/2023 2:10 PM    Electrophysiology [facility]

## 2023-03-10 NOTE — Patient Instructions (Addendum)
Medication Instructions:  Your physician has recommended you make the following change in your medication:  1) INCREASE Imdur (isosorbide mononitrate) to 90 mg once daily   *If you need a refill on your cardiac medications before your next appointment, please call your pharmacy*  Follow-Up: At [facility], you and your health needs are our priority.  As part of our continuing mission to provide you with exceptional heart care, we have created designated Provider Care Teams.  These Care Teams include your primary Cardiologist (physician) and Advanced Practice Providers (APPs -  Physician Assistants and Nurse Practitioners) who all work together to provide you with the care you need, when you need it.  Your next appointment:   6 months  Provider:   Steffanie Dunn, MD

## 2023-03-11 ENCOUNTER — Other Ambulatory Visit: Payer: Self-pay | Admitting: Adult Health

## 2023-03-11 ENCOUNTER — Ambulatory Visit: Payer: Self-pay

## 2023-03-11 ENCOUNTER — Encounter: Payer: Medicare Other | Admitting: Pharmacist

## 2023-03-11 DIAGNOSIS — D539 Nutritional anemia, unspecified: Secondary | ICD-10-CM

## 2023-03-11 DIAGNOSIS — D518 Other vitamin B12 deficiency anemias: Secondary | ICD-10-CM

## 2023-03-11 NOTE — Patient Outreach (Signed)
  Care Coordination   Follow Up Visit Note   03/11/2023 Name: Mark Jackson. MRN: [id number] DOB: 1930-02-22  Mark Jackson. is a 89 y.o. year old male who sees Hunter, Aldine Contes, MD for primary care. I spoke with  Mark Jackson. by phone today.  What matters to the patients health and wellness today?  Maintain health    Goals Addressed             This Visit's Progress    A,Fib managment       Care Coordination Interventions: Counseled on increased risk of stroke due to Afib and benefits of anticoagulation for stroke prevention Reviewed importance of adherence to anticoagulant exactly as prescribed  Patient doing well. Weight 184 lbs. Last blood pressure 150/80.  No syncope episodes.  Discussed HTN and A.Fib. management.  No concerns.         SDOH assessments and interventions completed:  Yes     Care Coordination Interventions:  Yes, provided   Follow up plan: Follow up call scheduled for September    Encounter Outcome:  Pt. Visit Completed   Bary Leriche, RN, MSN [facility] Care Management Care Management Coordinator Direct Line [phone]

## 2023-03-11 NOTE — Patient Instructions (Signed)
Visit Information  Thank you for taking time to visit with me today. Please don't hesitate to contact me if I can be of assistance to you.   Following are the goals we discussed today:   Goals Addressed             This Visit's Progress    A,Fib managment       Care Coordination Interventions: Counseled on increased risk of stroke due to Afib and benefits of anticoagulation for stroke prevention Reviewed importance of adherence to anticoagulant exactly as prescribed  Patient doing well. Weight 184 lbs. Last blood pressure 150/80.  No syncope episodes.  Discussed HTN and A.Fib. management.  No concerns.         Our next appointment is by telephone on 04/22/23 at 100 pm  Please call the care guide team at [phone] if you need to cancel or reschedule your appointment.   If you are experiencing a Mental Health or Behavioral Health Crisis or need someone to talk to, please call the Suicide and Crisis Lifeline: 988   Patient verbalizes understanding of instructions and care plan provided today and agrees to view in MyChart. Active MyChart status and patient understanding of how to access instructions and care plan via MyChart confirmed with patient.     The patient has been provided with contact information for the care management team and has been advised to call with any health related questions or concerns.   Bary Leriche, RN, MSN [facility] Care Management Care Management Coordinator Direct Line [phone]

## 2023-03-12 ENCOUNTER — Inpatient Hospital Stay: Payer: Medicare Other | Attending: Family Medicine

## 2023-03-12 ENCOUNTER — Inpatient Hospital Stay: Payer: Medicare Other | Admitting: Adult Health

## 2023-03-15 ENCOUNTER — Ambulatory Visit (INDEPENDENT_AMBULATORY_CARE_PROVIDER_SITE_OTHER): Payer: Medicare Other

## 2023-03-15 DIAGNOSIS — R55 Syncope and collapse: Secondary | ICD-10-CM | POA: Diagnosis not present

## 2023-03-22 ENCOUNTER — Telehealth: Payer: Self-pay

## 2023-03-22 NOTE — Telephone Encounter (Signed)
Following alert received from [employer] received for ILR alert for pause 8/11 @ 20:02, 3 sec.  Patient reports he was awake during this time but asymptomatic. Anticipate no changes, pt aware.   Routing to Dr. Elberta Fortis since 1st pause logged.

## 2023-03-24 ENCOUNTER — Encounter: Payer: Self-pay | Admitting: Nurse Practitioner

## 2023-03-24 ENCOUNTER — Ambulatory Visit: Payer: Medicare Other | Attending: Nurse Practitioner | Admitting: Nurse Practitioner

## 2023-03-24 VITALS — BP 130/88 | HR 72 | Ht 68.0 in | Wt 188.8 lb

## 2023-03-24 DIAGNOSIS — R600 Localized edema: Secondary | ICD-10-CM | POA: Diagnosis not present

## 2023-03-24 DIAGNOSIS — I6521 Occlusion and stenosis of right carotid artery: Secondary | ICD-10-CM | POA: Diagnosis not present

## 2023-03-24 DIAGNOSIS — Z8673 Personal history of transient ischemic attack (TIA), and cerebral infarction without residual deficits: Secondary | ICD-10-CM | POA: Diagnosis not present

## 2023-03-24 DIAGNOSIS — Z952 Presence of prosthetic heart valve: Secondary | ICD-10-CM | POA: Insufficient documentation

## 2023-03-24 DIAGNOSIS — I25118 Atherosclerotic heart disease of native coronary artery with other forms of angina pectoris: Secondary | ICD-10-CM | POA: Diagnosis not present

## 2023-03-24 DIAGNOSIS — I455 Other specified heart block: Secondary | ICD-10-CM | POA: Insufficient documentation

## 2023-03-24 DIAGNOSIS — N184 Chronic kidney disease, stage 4 (severe): Secondary | ICD-10-CM | POA: Diagnosis not present

## 2023-03-24 DIAGNOSIS — E785 Hyperlipidemia, unspecified: Secondary | ICD-10-CM | POA: Diagnosis not present

## 2023-03-24 DIAGNOSIS — I480 Paroxysmal atrial fibrillation: Secondary | ICD-10-CM | POA: Insufficient documentation

## 2023-03-24 DIAGNOSIS — Z87898 Personal history of other specified conditions: Secondary | ICD-10-CM | POA: Diagnosis not present

## 2023-03-24 DIAGNOSIS — I10 Essential (primary) hypertension: Secondary | ICD-10-CM | POA: Insufficient documentation

## 2023-03-24 NOTE — Patient Instructions (Signed)
Medication Instructions:  Your physician recommends that you continue on your current medications as directed. Please refer to the Current Medication list given to you today.  *If you need a refill on your cardiac medications before your next appointment, please call your pharmacy*   Lab Work: NONE ordered at this time of appointment     Testing/Procedures: NONE ordered at this time of appointment     Follow-Up: At [facility], you and your health needs are our priority.  As part of our continuing mission to provide you with exceptional heart care, we have created designated Provider Care Teams.  These Care Teams include your primary Cardiologist (physician) and Advanced Practice Providers (APPs -  Physician Assistants and Nurse Practitioners) who all work together to provide you with the care you need, when you need it.  We recommend signing up for the patient portal called "MyChart".  Sign up information is provided on this After Visit Summary.  MyChart is used to connect with patients for Virtual Visits (Telemedicine).  Patients are able to view lab/test results, encounter notes, upcoming appointments, etc.  Non-urgent messages can be sent to your provider as well.   To learn more about what you can do with MyChart, go to [email or web address].    Your next appointment:    Next available   Provider:   Bryan Lemma, MD

## 2023-03-24 NOTE — Progress Notes (Signed)
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 valve regurgitation. No evidence of mitral valve stenosis.  Tricuspid Valve: The tricuspid valve is normal in structure. Tricuspid valve regurgitation is mild . No evidence of tricuspid stenosis.  Aortic Valve: The aortic valve is normal in structure. Aortic valve regurgitation is mild. Moderate to severe aortic stenosis is present. There is severe calcifcation of the aortic valve. Aortic valve mean gradient measures 26.0 mmHg. Aortic valve peak gradient measures 18.2 mmHg. Aortic valve area, by VTI measures 0.72 cm. There is a 26 mm valve present in the aortic position. Procedure Date: 12/12/2019.  Pulmonic Valve: The pulmonic valve was normal in structure. Pulmonic valve regurgitation is not visualized. No evidence of pulmonic stenosis.  Aorta: The aortic root is normal in size and structure. There is mild (Grade II) plaque.  Venous: The inferior vena cava is normal in size with  greater than 50% respiratory variability, suggesting right atrial pressure of 3 mmHg.  IAS/Shunts: No atrial level shunt detected by color flow Doppler.   LEFT VENTRICLE PLAX 2D LVIDd:         4.20 cm LVIDs:         3.00 cm LVOT diam:     2.10 cm LV SV:         37 LV SV Index:   19 LVOT Area:     3.46 cm   AORTIC VALVE AV Area (Vmax):    0.96 cm AV Area (Vmean):   0.61 cm AV Area (VTI):     0.72 cm AV Vmax:           213.50 cm/s AV Vmean:          150.650 cm/s AV VTI:            0.520 m AV Peak Grad:      18.2 mmHg AV Mean Grad:      26.0 mmHg LVOT Vmax:         59.20 cm/s LVOT Vmean:        26.500 cm/s LVOT VTI:          0.108 m LVOT/AV VTI ratio: 0.21  MR Peak grad:    143.0 mmHg MR Mean grad:    89.5 mmHg   SHUNTS MR Vmax:         598.00 cm/s Systemic VTI:  0.11 m MR Vmean:        438.5 cm/s  Systemic Diam: 2.10 cm MR PISA:         1.90 cm MR PISA Eff ROA: 12 mm MR PISA Radius:  0.55 cm  Tobias Alexander MD Electronically signed by Tobias Alexander MD Signature Date/Time: 12/12/2019/2:08:34  PM    Final (Updated)   MONITORS  LONG TERM MONITOR-LIVE TELEMETRY (3-14 DAYS) 01/19/2023  Narrative   7-day Zio patch monitor-May 2024   Predominant Rhythm = Sinus: HR range 54-105 bpm, Avg 73 bpm   Rare PACs and rare PVCs with rare couplets/triplets.   1 pause noted 6.4 Sec (patient sent to the ER) - BB completely stopped - ILR inserted. ->  Pauses actually Complete Heart Block as P waves are noted without QRS complexes.   1 spell of bradycardia with Mobitz type II AV block-HR range 36 bpm.   3 atrial runs: Fastest and longest run was 10 beats (5.7 sec), HR range 92 and 118 bpm, avg 109 bpm; longest   Also IVR noted.   No Sustained Arrhythmias: Atrial Tachycardia (AT), Supraventricular Tachycardia (SVT), Atrial Fibrillation (A-Fib), Atrial Flutter (A-Flutter), Sustained Ventricular Tachycardia (VT)   Results were reviewed as the patient was in the  hospital.  Bryan Lemma, MD   CT SCANS  CT CORONARY [facility] W/CTA COR W/SCORE 11/29/2019  Addendum 11/29/2019 11:12 PM ADDENDUM REPORT: 11/29/2019 23:09  CLINICAL DATA:  89 year old male with severe aortic stenosis being evaluated for a TAVR procedure.  EXAM: Cardiac TAVR CT  TECHNIQUE: The patient was scanned on a [employer]. A 120 kV retrospective scan was triggered in the descending thoracic aorta at 111 HU's. Gantry rotation speed was 250 msecs and collimation was .6 mm. No beta blockade or nitro were given. The 3D data set was reconstructed in 5% intervals of the R-R cycle. Systolic and diastolic phases were analyzed on a dedicated work station using MPR, MIP and VRT modes. The patient received 60 cc of contrast.  FINDINGS: Aortic Valve: Trileaflet, with severely thickened and calcified leaflets and no calcifications extending into the LVOT.  Aorta: Normal size, very mild diffuse atherosclerotic plaque and calcifications. No dissection.  Sinotubular Junction: 29 x 28 mm  Ascending Thoracic Aorta: 35 x 34 mm  Aortic Arch: 26 x 25 mm  Descending Thoracic Aorta: 24 x 23 mm  Sinus of Valsalva Measurements:  Non-coronary: 33 mm  Right -coronary: 33 mm  Left -coronary: 33 mm  Coronary Artery Height above Annulus:  Left Main: 13.6 mm  Right Coronary: 18.3 mm  Virtual Basal Annulus Measurements:  Maximum/Minimum Diameter: 29.9 x 22.8 mm  Mean Diameter: 25.3 mm  Perimeter: 82.5 mm  Area: 505 mm2  Optimum Fluoroscopic Angle for Delivery: LAO 11 CAU 8  IMPRESSION: 1. Trileaflet, with severely thickened and calcified leaflets and no calcifications extending into the LVOT. Aortic valve calcium score 2255 consistent with severe aortic stenosis. Annular measurements suitable for delivery of a 26 mm Edwards-SAPIEN 3 Ultra valve.  2. Sufficient coronary to annulus distance.  3. Optimum Fluoroscopic Angle for Delivery:  LAO 11 CAU 8.  4. No  thrombus in the left atrial appendage.   Electronically Signed By: Tobias Alexander On: 11/29/2019 23:09  Narrative EXAM: OVER-READ INTERPRETATION  CT CHEST  The following report is an over-read performed by radiologist Dr. Trudie Reed of [facility] on 11/29/2019. This over-read does not include interpretation of cardiac or coronary anatomy or pathology. The coronary calcium score/coronary CTA interpretation by the cardiologist is attached.  COMPARISON:  None.  FINDINGS: Extracardiac findings will be described separately under dictation for contemporaneously obtained CTA chest, abdomen and pelvis dated 11/29/2019.  IMPRESSION: Please see separate dictation for contemporaneously obtained CTA chest, abdomen and pelvis 11/29/2019 for full description of relevant extracardiac findings.  Electronically Signed: By:  Trudie Reed M.D. On: 11/29/2019 16:13         Recent Labs: 12/25/2022: Magnesium 2.2 01/19/2023: B Natriuretic Peptide 350.5 01/29/2023: ALT 8; BUN 53; Creatinine, Ser 2.77; Hemoglobin 11.2; Platelets 152.0; Potassium 4.5; Sodium 139  Recent Lipid Panel    Component Value Date/Time   CHOL 104 01/29/2023 1124   TRIG 113.0 01/29/2023 1124   HDL 35.70 (L) 01/29/2023 1124   CHOLHDL 3 01/29/2023 1124   VLDL 22.6 01/29/2023 1124   LDLCALC 46 01/29/2023 1124   LDLDIRECT 38.0 07/16/2022 1204    History of Present Illness    89 year old male with with the above past medical history including CAD s/p DES x 2 overlapping-proximal LAD, aortic stenosis s/p TAVR, paroxysmal atrial fibrillation, sinus pause, carotid artery stenosis, CVA, chronic bilateral lower extremity edema, hypertension, hyperlipidemia, and CKD stage IV.  He has a history of CAD s/p DES x 2 overlapping-proximal LAD (in 2017 and 2020).  Cardiac catheterization in September 2020 revealed patent stents, otherwise nonobstructive disease.  Not on antiplatelet therapy in the setting of  chronic DOAC therapy due to atrial fibrillation. He has a history of aortic stenosis s/p TAVR.  Echocardiogram in 12/2022 showed EF 55 to 60%, normal LV function, no RWMA, G1 DD, normal RV, degenerative mitral valve, trivial mitral valve regurgitation, no aortic stenosis, stable aortic valve prosthesis, mean gradient 14 mmHg.  He was hospitalized in May 2024 in the setting of  6.4-second sinus pause on outpatient cardiac monitor.  Diltiazem was discontinued.  He returned to the ED in June 2024 in the setting of recurrent syncope, now s/p ILR, follows with EP.  He also has a history of carotid stenosis, follows with vascular surgery.  Most recent carotid Dopplers in 12/2022 revealed 80 to 90% R ICA stenosis, greater than 50% R ECA stenosis, 1 to 39% LICA stenosis.  He was last seen in the office on 03/10/2023 by Dr. Lalla Brothers, EP.  He noted an episode of chest pain.  Imdur was increased to 90 mg daily.  BP was mildly elevated.    He presents today for follow-up accompanied by his daughter.  Since his last visit he has been stable from a cardiac standpoint.  He did receive a notification of a 3-second pause that occurred on 03/21/2023.  He notes intermittent dizziness, denies any presyncope, syncope.  He denies any further chest pain, denies dyspnea.  He does have stable chronic bilateral lower extremity edema, denies PND, orthopnea, weight gain. He does report daytime fatigue as well as a history of snoring. He does not sleep well at night-he notes he gets up every 1-2 hours to use the bathroom.  He is concerned that he had a recent notification from his loop recorder device and hopes to follow-up with Dr. Lalla Brothers regarding this.  Home Medications    Current Outpatient Medications  Medication Sig Dispense Refill   acetaminophen (TYLENOL) 325 MG tablet Take 2 tablets (650 mg total) by mouth every 4 (four) hours as needed for mild pain (or temp > 37.5 C (99.5 F)).     amLODipine (NORVASC) 5 MG tablet Take 0.5  tablets (2.5 mg total) by mouth daily. 30 tablet 11   amoxicillin (AMOXIL) 500 MG capsule Take 500 mg by mouth as needed (Use for pre dental procedure).     apixaban (ELIQUIS) 2.5 MG TABS tablet Take 1 tablet (2.5 mg total) by mouth 2 (two) times daily. 180 tablet 3   diclofenac Sodium (VOLTAREN) 1 % GEL Apply 2  g topically 4 (four) times daily. 2 g 0   ezetimibe (ZETIA) 10 MG tablet Take 1 tablet (10 mg total) by mouth daily. 90 tablet 3   ferrous sulfate 325 (65 FE) MG tablet Take 325 mg by mouth daily with breakfast.     FLUoxetine (PROZAC) 10 MG capsule Take 1 capsule (10 mg total) by mouth daily. 30 capsule 2   furosemide (LASIX) 40 MG tablet Take 1 tablet (40 mg total) by mouth daily. 90 tablet 3   isosorbide mononitrate (IMDUR) 60 MG 24 hr tablet Take 1.5 tablets (90 mg total) by mouth daily. 135 tablet 3   Multiple Vitamins-Minerals (CENTRUM SILVER ADULT 50+) TABS Take 1 tablet by mouth daily.     nitroGLYCERIN (NITROSTAT) 0.4 MG SL tablet ONE TABLET UNDER TONGUE WHEN NEEDED FOR CHEST PAIN. MAY REPEAT IN 5 MINUTES. 25 tablet 0   rosuvastatin (CRESTOR) 10 MG tablet TAKE (1) TABLET DAILY AT BEDTIME. 90 tablet 3   topiramate (TOPAMAX) 100 MG tablet Take 1 tablet (100 mg total) by mouth 2 (two) times daily. 60 tablet 3   traMADol (ULTRAM) 50 MG tablet Take 1 tablet (50 mg total) by mouth every 8 (eight) hours as needed for up to 5 days. 15 tablet 0   No current facility-administered medications for this visit.     Review of Systems    He denies chest pain, palpitations, dyspnea, pnd, orthopnea, n, v, syncope, edema, weight gain, or early satiety. All other systems reviewed and are otherwise negative except as noted above.   Physical Exam    VS:  BP 130/88   Pulse 72   Ht 5\' 8"  (1.727 m)   Wt 188 lb 12.8 oz (85.6 kg)   SpO2 98%   BMI 28.71 kg/m  GEN: Well nourished, well developed, in no acute distress. HEENT: normal. Neck: Supple, no JVD, carotid bruits, or masses. Cardiac: RRR,  no murmurs, rubs, or gallops. No clubbing, cyanosis, edema.  Radials/DP/PT 2+ and equal bilaterally.  Respiratory:  Respirations regular and unlabored, clear to auscultation bilaterally. GI: Soft, nontender, nondistended, BS + x 4. MS: no deformity or atrophy. Skin: warm and dry, no rash. Neuro:  Strength and sensation are intact. Psych: Normal affect.  Accessory Clinical Findings    ECG personally reviewed by me today - EKG Interpretation Date/Time:  Wednesday March 24 2023 11:27:45 EDT Ventricular Rate:  72 PR Interval:  406 QRS Duration:  92 QT Interval:  392 QTC Calculation: 429 R Axis:   -34  Text Interpretation: Sinus rhythm with 1st degree A-V block Left axis deviation Minimal voltage criteria for LVH, may be normal variant ( R in aVL ) When compared with ECG of 10-Mar-2023 14:10, No significant change was found Confirmed by Bernadene Person ([id number]) on 03/24/2023 11:56:28 AM  - no acute changes.   Lab Results  Component Value Date   WBC 10.8 (H) 01/29/2023   HGB 11.2 (L) 01/29/2023   HCT 34.4 (L) 01/29/2023   MCV 102.2 (H) 01/29/2023   PLT 152.0 01/29/2023   Lab Results  Component Value Date   CREATININE 2.77 (H) 01/29/2023   BUN 53 (H) 01/29/2023   NA 139 01/29/2023   K 4.5 01/29/2023   CL 107 01/29/2023   CO2 24 01/29/2023   Lab Results  Component Value Date   ALT 8 01/29/2023   AST 19 01/29/2023   ALKPHOS 68 01/29/2023   BILITOT 0.4 01/29/2023   Lab Results  Component Value Date   CHOL 104  01/29/2023   HDL 35.70 (L) 01/29/2023   LDLCALC 46 01/29/2023   LDLDIRECT 38.0 07/16/2022   TRIG 113.0 01/29/2023   CHOLHDL 3 01/29/2023    Lab Results  Component Value Date   HGBA1C 5.9 01/29/2023    Assessment & Plan    1. CAD: S/p DES x 2 overlapping-proximal LAD (in 2017 and 2020). Stable with no anginal symptoms. No indication for ischemic evaluation.  Continue amlodipine, Imdur, Crestor, and Zetia.  No ASA in the setting of chronic DOAC therapy.  2. Aortic  stenosis: S/p TAVR.  Echo in 12/2022 showed EF 55 to 60%, normal LV function, no RWMA, G1 DD, normal RV, degenerative mitral valve, trivial mitral valve regurgitation, no aortic stenosis, stable aortic valve prosthesis, mean gradient 14 mmHg (slight progression in gradient, overall stable).  No new symptoms.  3. Paroxysmal atrial fibrillation: No longer on diltiazem in the setting of sinus pause.  Any awareness of any breakthrough atrial fibrillation, noted on device.  Continue Eliquis.  4. Sinus pause/1st degree AV block: Hospitalized in May 2024 in the setting of  6.4-second sinus pause noted on outpatient cardiac monitor.  S/p loop recorder.  Recent pause of 3 seconds noted on ILR.  I will notify Dr. Lalla Brothers as I am not sure he is aware (and per patient and family request). He notes intermittent dizziness, denies presyncope, syncope.  Reviewed ED precautions.  Recommend follow-up with EP (pending Dr. Lovena Neighbours recommendations).  5. Carotid stenosis: Most recent carotid Dopplers in 12/2022 revealed 80 to 90% R ICA stenosis, greater than 50% R ECA stenosis, 1 to 39% LICA stenosis.  He does note intermittent dizziness, denies presyncope, syncope.  Repeat carotid Doppler scheduled for 03/2023, he has follow-up scheduled with vascular surgery on 03/30/2023.  Continue Crestor, Zetia.  6. Chronic bilateral lower extremity edema: Chronic, stable.  Generally euvolemic and well compensated on exam. Continue Lasix.   7. Hypertension: BP well controlled. Continue current antihypertensive regimen.   8. Hyperlipidemia: LDL was 46 in 01/2023.  Continue Crestor, Zetia.   9. History of CVA: No recurrence.  Continue Eliquis, Crestor, Zetia.  10. CKD stage IV: Creatinine was stable at 2.77 in 01/2023.  11. Daytime fatigue/history of snoring: Stop bang of 6.  He notes daytime fatigue, he does have a history of snoring.  We discussed possible sleep study, however, patient declines at this point in time.  12.  Disposition: Follow-up next available with Dr. Herbie Baltimore, follow-up with EP pending Dr. Lovena Neighbours recommendations.  Joylene Grapes, NP 03/24/2023, 12:43 PM

## 2023-03-25 ENCOUNTER — Encounter (INDEPENDENT_AMBULATORY_CARE_PROVIDER_SITE_OTHER): Payer: Self-pay
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
## 2023-03-30 ENCOUNTER — Ambulatory Visit (HOSPITAL_COMMUNITY)
Admission: RE | Admit: 2023-03-30 | Discharge: 2023-03-30 | Disposition: A | Discharge status: Home / Self Care | Payer: Medicare Other | Source: Ambulatory Visit | Attending: Vascular Surgery | Admitting: Vascular Surgery

## 2023-03-30 ENCOUNTER — Encounter: Payer: Self-pay | Admitting: Vascular Surgery

## 2023-03-30 ENCOUNTER — Ambulatory Visit (INDEPENDENT_AMBULATORY_CARE_PROVIDER_SITE_OTHER): Payer: Medicare Other | Admitting: Vascular Surgery

## 2023-03-30 ENCOUNTER — Other Ambulatory Visit: Payer: Self-pay | Admitting: Physical Medicine & Rehabilitation

## 2023-03-30 VITALS — BP 169/85 | HR 69 | Temp 98.0°F | Resp 20 | Ht 68.0 in | Wt 189.0 lb

## 2023-03-30 DIAGNOSIS — I6523 Occlusion and stenosis of bilateral carotid arteries: Secondary | ICD-10-CM

## 2023-03-30 DIAGNOSIS — I6521 Occlusion and stenosis of right carotid artery: Secondary | ICD-10-CM | POA: Diagnosis not present

## 2023-03-30 NOTE — Progress Notes (Signed)
Carelink Summary Report / Loop Recorder 

## 2023-04-19 ENCOUNTER — Ambulatory Visit (INDEPENDENT_AMBULATORY_CARE_PROVIDER_SITE_OTHER): Payer: Medicare Other

## 2023-04-19 DIAGNOSIS — Z87898 Personal history of other specified conditions: Secondary | ICD-10-CM

## 2023-04-19 LAB — CUP PACEART REMOTE DEVICE CHECK
Date Time Interrogation Session: 20240906160312
Implantable Pulse Generator Implant Date: 20240530

## 2023-04-22 ENCOUNTER — Ambulatory Visit: Payer: Self-pay

## 2023-04-22 NOTE — Patient Instructions (Signed)
Visit Information  Thank you for taking time to visit with me today. Please don't hesitate to contact me if I can be of assistance to you.   Following are the goals we discussed today:   Goals Addressed             This Visit's Progress    A,Fib managment       Care Coordination Interventions: Counseled on increased risk of stroke due to Afib and benefits of anticoagulation for stroke prevention Reviewed importance of adherence to anticoagulant exactly as prescribed  Patient doing well. Weight 181-183 lbs. Reports blood pressure doing good.  No syncopal episodes.  Discussed HTN and A.Fib. management.  No concerns.         Our next appointment is by telephone on 05/20/23 at 1:00 pm  Please call the care guide team at [phone] if you need to cancel or reschedule your appointment.   If you are experiencing a Mental Health or Behavioral Health Crisis or need someone to talk to, please call the Suicide and Crisis Lifeline: 988   Patient verbalizes understanding of instructions and care plan provided today and agrees to view in MyChart. Active MyChart status and patient understanding of how to access instructions and care plan via MyChart confirmed with patient.     The patient has been provided with contact information for the care management team and has been advised to call with any health related questions or concerns.   Bary Leriche, RN, MSN [facility], [facility] Management Community Coordinator Direct Dial: [phone]  Fax: [phone] Website: [email or web address]

## 2023-04-22 NOTE — Patient Outreach (Signed)
  Care Coordination   Follow Up Visit Note   04/22/2023 Name: Mark Jackson. MRN: [id number] DOB: [birth year]  Mark Pao. is a 89 y.o. year old male who sees Hunter, Aldine Contes, MD for primary care. I spoke with  Mark Pao. by phone today.  What matters to the patients health and wellness today?  Maintain health    Goals Addressed             This Visit's Progress    A,Fib managment       Care Coordination Interventions: Counseled on increased risk of stroke due to Afib and benefits of anticoagulation for stroke prevention Reviewed importance of adherence to anticoagulant exactly as prescribed  Patient doing well. Weight 181-183 lbs. Reports blood pressure doing good.  No syncopal episodes.  Discussed HTN and A.Fib. management.  No concerns.         SDOH assessments and interventions completed:  Yes     Care Coordination Interventions:  Yes, provided   Follow up plan: Follow up call scheduled for October    Encounter Outcome:  Patient Visit Completed   Bary Leriche, RN, MSN [place]  [facility], [facility] Management Community Coordinator Direct Dial: [phone]  Fax: [phone] Website: [email or web address]

## 2023-04-27 ENCOUNTER — Other Ambulatory Visit: Payer: Self-pay | Admitting: Family Medicine

## 2023-05-04 ENCOUNTER — Encounter: Payer: Self-pay | Admitting: Family Medicine

## 2023-05-04 ENCOUNTER — Ambulatory Visit (INDEPENDENT_AMBULATORY_CARE_PROVIDER_SITE_OTHER): Payer: Medicare Other | Admitting: Family Medicine

## 2023-05-04 VITALS — BP 140/80 | HR 70 | Temp 97.9°F | Resp 18 | Ht 68.0 in | Wt 188.5 lb

## 2023-05-04 DIAGNOSIS — I480 Paroxysmal atrial fibrillation: Secondary | ICD-10-CM | POA: Diagnosis not present

## 2023-05-04 DIAGNOSIS — Z23 Encounter for immunization: Secondary | ICD-10-CM | POA: Diagnosis not present

## 2023-05-04 DIAGNOSIS — N184 Chronic kidney disease, stage 4 (severe): Secondary | ICD-10-CM

## 2023-05-04 DIAGNOSIS — I10 Essential (primary) hypertension: Secondary | ICD-10-CM

## 2023-05-04 DIAGNOSIS — E785 Hyperlipidemia, unspecified: Secondary | ICD-10-CM | POA: Diagnosis not present

## 2023-05-04 NOTE — Progress Notes (Signed)
Phone [phone] In person visit   Subjective:   Mark Jackson. is a 89 y.o. year old very pleasant male patient who presents for/with See problem oriented charting Chief Complaint  Patient presents with   Follow-up    3 month follow-up    Past Medical History-  Patient Active Problem List   Diagnosis Date Noted   Right hemiparesis (HCC) 03/05/2022    Priority: High   S/P TAVR (transcatheter aortic valve replacement) 12/12/2019    Priority: High   CAD S/P DES PCI to proximal LAD 03/22/2015    Priority: High   PAF (paroxysmal atrial fibrillation) (HCC) 08/20/2014    Priority: High    Class: Diagnosis of   Chronic kidney disease (CKD), stage IV (severe) (HCC) 08/20/2014    Priority: High   Personal history of prostate cancer 10/22/2008    Priority: High   Hyperglycemia 09/27/2017    Priority: Medium    B12 deficiency 01/06/2017    Priority: Medium    BPH associated with nocturia 06/15/2016    Priority: Medium    Aortic stenosis 03/14/2015    Priority: Medium    Hereditary and idiopathic peripheral neuropathy 01/12/2014    Priority: Medium    H/O syncope 09/03/2013    Priority: Medium    Right-sided carotid artery disease (HCC) 03/02/2013    Priority: Medium    Hyperlipidemia with target LDL less than 70 03/02/2013    Priority: Medium    Migraine without aura 10/26/2012    Priority: Medium    Macrocytic anemia 10/23/2008    Priority: Medium    Essential hypertension 10/22/2008    Priority: Medium    Excessive daytime sleepiness 10/22/2008    Priority: Medium    Thrombocytopenia, unspecified (HCC) 12/11/2021    Priority: Low   Syncope 11/27/2019    Priority: Low   Fatigue 11/08/2017    Priority: Low   Chronic diarrhea 01/06/2017    Priority: Low   Perianal dermatitis 06/19/2015    Priority: Low   Rectal bleeding 04/25/2015    Priority: Low   Dyspnea 08/31/2014    Priority: Low   Long term current use of anticoagulant therapy: CHA2DS2-VASc 7 ->  Eliquis 2.5 mg twice daily 08/27/2014    Priority: Low   GLAUCOMA 10/23/2008    Priority: Low   Hemorrhoids 10/22/2008    Priority: Low   Arthropathy 10/22/2008    Priority: Low   History of colonic polyps 10/22/2008    Priority: Low   Sinus pause 01/05/2023   Carotid stenosis, right 12/25/2022   Demand ischemia 12/24/2022   Genetic testing 12/22/2022   Gait disturbance, post-stroke 07/28/2022   Situational depression 03/05/2022   Loud snoring 02/04/2022   Left thalamic infarction (HCC) 12/29/2021   Hematuria 12/11/2020   Bilateral lower extremity edema 04/15/2017   Crescendo angina (HCC) 06/01/2016    Medications- reviewed and updated Current Outpatient Medications  Medication Sig Dispense Refill   acetaminophen (TYLENOL) 325 MG tablet Take 2 tablets (650 mg total) by mouth every 4 (four) hours as needed for mild pain (or temp > 37.5 C (99.5 F)).     amLODipine (NORVASC) 5 MG tablet Take 0.5 tablets (2.5 mg total) by mouth daily. 30 tablet 11   amoxicillin (AMOXIL) 500 MG capsule Take 500 mg by mouth as needed (Use for pre dental procedure).     apixaban (ELIQUIS) 2.5 MG TABS tablet Take 1 tablet (2.5 mg total) by mouth 2 (two) times daily. 180 tablet 3  diclofenac Sodium (VOLTAREN) 1 % GEL Apply 2 g topically 4 (four) times daily. 2 g 0   ezetimibe (ZETIA) 10 MG tablet Take 1 tablet (10 mg total) by mouth daily. 90 tablet 3   ferrous sulfate 325 (65 FE) MG tablet Take 325 mg by mouth daily with breakfast.     FLUoxetine (PROZAC) 10 MG capsule Take 1 capsule (10 mg total) by mouth daily. 30 capsule 2   furosemide (LASIX) 40 MG tablet Take 1 tablet (40 mg total) by mouth daily. 90 tablet 3   isosorbide mononitrate (IMDUR) 60 MG 24 hr tablet Take 1.5 tablets (90 mg total) by mouth daily. 135 tablet 3   Multiple Vitamins-Minerals (CENTRUM SILVER ADULT 50+) TABS Take 1 tablet by mouth daily.     nitroGLYCERIN (NITROSTAT) 0.4 MG SL tablet ONE TABLET UNDER TONGUE WHEN NEEDED FOR  CHEST PAIN. MAY REPEAT IN 5 MINUTES. 25 tablet 0   rosuvastatin (CRESTOR) 10 MG tablet TAKE (1) TABLET DAILY AT BEDTIME. 90 tablet 3   topiramate (TOPAMAX) 100 MG tablet Take 1 tablet (100 mg total) by mouth 2 (two) times daily. 60 tablet 3   traMADol (ULTRAM) 50 MG tablet Take 1 tablet (50 mg total) by mouth every 8 (eight) hours as needed for up to 5 days. 15 tablet 0   No current facility-administered medications for this visit.     Objective:  BP (!) 140/80 Comment: no improvement on repeat  Pulse 70   Temp 97.9 F (36.6 C) (Temporal)   Resp 18   Ht 5\' 8"  (1.727 m)   Wt 188 lb 8 oz (85.5 kg)   SpO2 97%   BMI 28.66 kg/m  Gen: NAD, resting comfortably CV: RRR no murmurs rubs or gallops Lungs: CTAB no crackles, wheeze, rhonchi Ext: 1+ edema Skin: warm, dry Neuro: walks with walker    Assessment and Plan   #Fatigue- feels cold and tired most of the time. Sleeps a lot. Legs itch. States eyes play tricks at times.  -offered bloodwork and wants to hold off - declines sleep apnea testing as well  #CAD status post DES PCI to proximal LAD- sees Dr. Herbie Baltimore #History of CVA-Dec 26 2021 hospitalization with right hemiparesis (left thalamic infarction and small left cerebellar infarct thalamic CVA most likely SVD although 2 locations suggests cardioembolic) #hyperlipidemia Lab Results  Component Value Date   CHOL 104 01/29/2023   HDL 35.70 (L) 01/29/2023   LDLCALC 46 01/29/2023   LDLDIRECT 38.0 07/16/2022   TRIG 113.0 01/29/2023   CHOLHDL 3 01/29/2023  S: Medication:Aspirin was not added as patient already on Eliquis during May 2023 stroke hospitalization,  Crestor 10 mg (reduced from 20 due to renal function), Zetia 10 mg added after May 2023 stroke - no chest pain. Stable shortness of breath   A/P: coronary artery disease largely asymptomatic (stable angina with imdur and no current symptoms) - continue current medications- not on aspirin as on eliquis as above Lipids at goal  - continue current medications with LDL under 70 also goal for stroke history -long term edema issues- elevating feet at night  # Atrial fibrillation S: Rate controlled with no medicine. Diltiazem stopped due to sinus pause Anticoagulated with eliquis 2.5 mg BID A/P: appropriately anticoagulated and rate controlled- continue current medicine. Reduced eliquis dose due to kidney function   #CKD IV- sees Dr. Albesa Seen - no dialysis planned -Lasix increased to 40 mg December 2023- has remained on this -stable 1+ edema on exam  #hypertension S:  medication: amlodipine 5 mg, lasix 40 mg, imdur 60 mg BP Readings from Last 3 Encounters:  05/04/23 (!) 140/80  03/30/23 (!) 169/85  03/24/23 130/88  A/P: very mild elevation but concern with cardiology has been running him too low at his age- so we opted to continue current medications   # Depression S: Medication: fluoxetine 10 mg capsule - still stressful to him that is unable to work A/P: full remission with phq9 under 5- continue current medications   #Migraines- still with headache(s) even with topamax 100 mg twice daily  # B12 deficiency S: Current treatment/medication (oral vs. IM):  injections eery 5 weeks at home A/P: feels more tired as gets close to or misses doses- continue current medications    Recommended follow up: Return in about 4 months (around 09/03/2023) for followup or sooner if needed.Schedule b4 you leave. Future Appointments  Date Time Provider Department Center  05/12/2023 11:00 AM Marykay Lex, MD [facility] None  05/20/2023  1:00 PM Fleeta Emmer, RN [facility] None  05/24/2023  7:20 AM [facility] DEVICE REMOTES [facility]  06/28/2023  7:20 AM [facility] DEVICE REMOTES [facility]  08/02/2023  7:15 AM [facility] DEVICE REMOTES [facility]  09/06/2023  7:00 AM [facility] DEVICE REMOTES [facility]  10/11/2023  7:05 AM [facility] DEVICE REMOTES [facility]  11/15/2023  7:10 AM [facility] DEVICE REMOTES [facility]  12/02/2023 11:30 AM [facility] ANNUAL WELLNESS VISIT 1 [facility]  12/20/2023  7:05 AM [facility] DEVICE REMOTES [facility]    Lab/Order associations:   ICD-10-CM   1. Essential hypertension  I10     2. Chronic kidney disease (CKD), stage IV (severe) (HCC)  N18.4     3. Hyperlipidemia with target LDL less than 70  E78.5     4. PAF (paroxysmal atrial fibrillation) (HCC)  I48.0       No orders of the defined types were placed in this encounter.   Return precautions advised.  Tana Conch, MD

## 2023-05-04 NOTE — Addendum Note (Signed)
Addended by: Gwenette Greet on: 05/04/2023 11:24 AM   Modules accepted: Orders

## 2023-05-04 NOTE — Patient Instructions (Addendum)
High dose Flu shot today Consider COVID shot after 2 weeks at pharmacy- sorry we do not have this  Double check with nephrology- I do not see a recent note from them and you may be due for visit  You wanted to hold off on labs today- consider next visit  Recommended follow up: Return in about 4 months (around 09/03/2023) for followup or sooner if needed.Schedule b4 you leave.

## 2023-05-06 NOTE — Progress Notes (Signed)
Carelink Summary Report / Loop Recorder 

## 2023-05-12 ENCOUNTER — Emergency Department (HOSPITAL_COMMUNITY)
Admission: EM | Admit: 2023-05-12 | Discharge: 2023-05-12 | Disposition: A | Discharge status: Home / Self Care | Payer: Medicare Other | Attending: Emergency Medicine | Admitting: Emergency Medicine

## 2023-05-12 ENCOUNTER — Emergency Department (HOSPITAL_COMMUNITY): Payer: Medicare Other

## 2023-05-12 ENCOUNTER — Other Ambulatory Visit: Payer: Self-pay

## 2023-05-12 ENCOUNTER — Ambulatory Visit: Payer: Medicare Other | Admitting: Cardiology

## 2023-05-12 ENCOUNTER — Encounter (HOSPITAL_COMMUNITY): Payer: Self-pay

## 2023-05-12 DIAGNOSIS — I129 Hypertensive chronic kidney disease with stage 1 through stage 4 chronic kidney disease, or unspecified chronic kidney disease: Secondary | ICD-10-CM | POA: Insufficient documentation

## 2023-05-12 DIAGNOSIS — I4891 Unspecified atrial fibrillation: Secondary | ICD-10-CM | POA: Insufficient documentation

## 2023-05-12 DIAGNOSIS — Z79899 Other long term (current) drug therapy: Secondary | ICD-10-CM | POA: Diagnosis not present

## 2023-05-12 DIAGNOSIS — N189 Chronic kidney disease, unspecified: Secondary | ICD-10-CM | POA: Insufficient documentation

## 2023-05-12 DIAGNOSIS — R471 Dysarthria and anarthria: Secondary | ICD-10-CM | POA: Insufficient documentation

## 2023-05-12 DIAGNOSIS — G459 Transient cerebral ischemic attack, unspecified: Secondary | ICD-10-CM | POA: Diagnosis not present

## 2023-05-12 DIAGNOSIS — Z7901 Long term (current) use of anticoagulants: Secondary | ICD-10-CM | POA: Diagnosis not present

## 2023-05-12 DIAGNOSIS — I6782 Cerebral ischemia: Secondary | ICD-10-CM | POA: Diagnosis not present

## 2023-05-12 DIAGNOSIS — R9089 Other abnormal findings on diagnostic imaging of central nervous system: Secondary | ICD-10-CM | POA: Diagnosis not present

## 2023-05-12 DIAGNOSIS — R4701 Aphasia: Secondary | ICD-10-CM | POA: Diagnosis not present

## 2023-05-12 DIAGNOSIS — I10 Essential (primary) hypertension: Secondary | ICD-10-CM | POA: Diagnosis not present

## 2023-05-12 DIAGNOSIS — I639 Cerebral infarction, unspecified: Secondary | ICD-10-CM | POA: Diagnosis not present

## 2023-05-12 LAB — COMPREHENSIVE METABOLIC PANEL
ALT: 14 U/L (ref 0–44)
AST: 21 U/L (ref 15–41)
Albumin: 3.3 g/dL — ABNORMAL LOW (ref 3.5–5.0)
Alkaline Phosphatase: 65 U/L (ref 38–126)
Anion gap: 8 (ref 5–15)
BUN: 50 mg/dL — ABNORMAL HIGH (ref 8–23)
CO2: 21 mmol/L — ABNORMAL LOW (ref 22–32)
Calcium: 8.6 mg/dL — ABNORMAL LOW (ref 8.9–10.3)
Chloride: 110 mmol/L (ref 98–111)
Creatinine, Ser: 2.82 mg/dL — ABNORMAL HIGH (ref 0.61–1.24)
GFR, Estimated: 21 mL/min — ABNORMAL LOW (ref >60)
Glucose, Bld: 105 mg/dL — ABNORMAL HIGH (ref 70–99)
Potassium: 4.3 mmol/L (ref 3.5–5.1)
Sodium: 139 mmol/L (ref 135–145)
Total Bilirubin: 0.8 mg/dL (ref 0.3–1.2)
Total Protein: 6.5 g/dL (ref 6.5–8.1)

## 2023-05-12 LAB — CBC
HCT: 35.8 % — ABNORMAL LOW (ref 39.0–52.0)
Hemoglobin: 11.2 g/dL — ABNORMAL LOW (ref 13.0–17.0)
MCH: 32.7 pg (ref 26.0–34.0)
MCHC: 31.3 g/dL (ref 30.0–36.0)
MCV: 104.4 fL — ABNORMAL HIGH (ref 80.0–100.0)
Platelets: 118 10*3/uL — ABNORMAL LOW (ref 150–400)
RBC: 3.43 MIL/uL — ABNORMAL LOW (ref 4.22–5.81)
RDW: 12.7 % (ref 11.5–15.5)
WBC: 10 10*3/uL (ref 4.0–10.5)
nRBC: 0 % (ref 0.0–0.2)

## 2023-05-12 LAB — APTT: aPTT: 31 s (ref 24–36)

## 2023-05-12 LAB — I-STAT CHEM 8, ED
BUN: 44 mg/dL — ABNORMAL HIGH (ref 8–23)
Calcium, Ion: 1.05 mmol/L — ABNORMAL LOW (ref 1.15–1.40)
Chloride: 111 mmol/L (ref 98–111)
Creatinine, Ser: 3.2 mg/dL — ABNORMAL HIGH (ref 0.61–1.24)
Glucose, Bld: 102 mg/dL — ABNORMAL HIGH (ref 70–99)
HCT: 35 % — ABNORMAL LOW (ref 39.0–52.0)
Hemoglobin: 11.9 g/dL — ABNORMAL LOW (ref 13.0–17.0)
Potassium: 4.3 mmol/L (ref 3.5–5.1)
Sodium: 141 mmol/L (ref 135–145)
TCO2: 17 mmol/L — ABNORMAL LOW (ref 22–32)

## 2023-05-12 LAB — RAPID URINE DRUG SCREEN, HOSP PERFORMED
Amphetamines: NOT DETECTED
Barbiturates: NOT DETECTED
Benzodiazepines: NOT DETECTED
Cocaine: NOT DETECTED
Opiates: NOT DETECTED
Tetrahydrocannabinol: NOT DETECTED

## 2023-05-12 LAB — DIFFERENTIAL
Abs Immature Granulocytes: 0.03 10*3/uL (ref 0.00–0.07)
Basophils Absolute: 0.1 10*3/uL (ref 0.0–0.1)
Basophils Relative: 1 %
Eosinophils Absolute: 0.4 10*3/uL (ref 0.0–0.5)
Eosinophils Relative: 4 %
Immature Granulocytes: 0 %
Lymphocytes Relative: 16 %
Lymphs Abs: 1.6 10*3/uL (ref 0.7–4.0)
Monocytes Absolute: 1 10*3/uL (ref 0.1–1.0)
Monocytes Relative: 10 %
Neutro Abs: 6.9 10*3/uL (ref 1.7–7.7)
Neutrophils Relative %: 69 %

## 2023-05-12 LAB — PROTIME-INR
INR: 1.2 (ref 0.8–1.2)
Prothrombin Time: 15 s (ref 11.4–15.2)

## 2023-05-12 LAB — ETHANOL: Alcohol, Ethyl (B): <10 mg/dL (ref <10)

## 2023-05-12 LAB — URINALYSIS, ROUTINE W REFLEX MICROSCOPIC
Bilirubin Urine: NEGATIVE
Glucose, UA: NEGATIVE mg/dL
Hgb urine dipstick: NEGATIVE
Ketones, ur: NEGATIVE mg/dL
Leukocytes,Ua: NEGATIVE
Nitrite: NEGATIVE
Protein, ur: NEGATIVE mg/dL
Specific Gravity, Urine: 1.009 (ref 1.005–1.030)
pH: 5 (ref 5.0–8.0)

## 2023-05-12 LAB — CBG MONITORING, ED: Glucose-Capillary: 97 mg/dL (ref 70–99)

## 2023-05-12 NOTE — ED Triage Notes (Signed)
Hx of stroke last year and on eliquis. Seen by home health nurse today and at 1230pm, pt had an episode of aphasia that lasted for 10 minutes then resolved. Not too long after, pt then had another episode where pt was unable to get words out. 2nd episode also resolved. Pt reports feeling weak. Alert and oriented x 4. Initial bp was 200/100 with EMS.

## 2023-05-12 NOTE — Discharge Instructions (Signed)
You had an episode today of difficulty speaking.  You had a workup for stroke or mini stroke in the ER.  Your blood test and MRI scans were reassuring.  We did discuss a small possibility this may still be a mini stroke that is not seen on imaging.  We would recommend you continue with your Eliquis that you are typically taking as well as your other home medications.  You will need to call your neurologist to schedule follow-up appointment.  If you have new or worsening difficulty with speech, or any other strokelike symptoms, please call 911 or return to the hospital.

## 2023-05-12 NOTE — ED Provider Notes (Signed)
[place] EMERGENCY DEPARTMENT AT [facility] Provider Note   CSN: [id number] Arrival date & time: 05/12/23  1424     History  Chief Complaint  Patient presents with   Transient Ischemic Attack         Mark Jackson. is a 89 y.o. male w/ hx of A Fib, CKD, on eliquis 2.5 mg BID, CVA, HTN, presenting from home with concern for difficulty with speech.  Patient had 2 witnessed episodes of aphasia or dysarthria that lasted about 10 minutes.  The first episode occurred around 12:30 PM as witnessed by home health nurse.  The second episode occurred approximate 1 hour prior to his arrival.  Both episodes have fully resolved.  Patient reports he feels generally fatigued but is an ongoing for "several weeks".  He denies any headache.  He says he feels back to baseline.  He has family members present at the bedside report he is back to baseline.  He has been compliant with all of his medication including his Eliquis, which is renally dosed.  External records were show that the patient had a history of a stroke in May 2023, which was small acute infarct in the left thalamic capsular region, with subacute appearing small left cerebellar infarcts.  HPI     Home Medications Prior to Admission medications   Medication Sig Start Date End Date Taking? Authorizing Provider  acetaminophen (TYLENOL) 325 MG tablet Take 2 tablets (650 mg total) by mouth every 4 (four) hours as needed for mild pain (or temp > 37.5 C (99.5 F)). 01/12/22   Angiulli, Mcarthur Rossetti, PA-C  amLODipine (NORVASC) 5 MG tablet Take 0.5 tablets (2.5 mg total) by mouth daily. 01/07/23 01/07/24  Arty Baumgartner, NP  amoxicillin (AMOXIL) 500 MG capsule Take 500 mg by mouth as needed (Use for pre dental procedure).    [provider]  apixaban (ELIQUIS) 2.5 MG TABS tablet Take 1 tablet (2.5 mg total) by mouth 2 (two) times daily. 09/03/22   Shelva Majestic, MD  diclofenac Sodium (VOLTAREN) 1 % GEL Apply 2 g topically 4  (four) times daily. 01/12/22   Angiulli, Mcarthur Rossetti, PA-C  ezetimibe (ZETIA) 10 MG tablet Take 1 tablet (10 mg total) by mouth daily. 04/27/23   Shelva Majestic, MD  ferrous sulfate 325 (65 FE) MG tablet Take 325 mg by mouth daily with breakfast.    [provider]  FLUoxetine (PROZAC) 10 MG capsule Take 1 capsule (10 mg total) by mouth daily. 03/01/23 02/29/24  Shelva Majestic, MD  furosemide (LASIX) 40 MG tablet Take 1 tablet (40 mg total) by mouth daily. 07/21/22   Shelva Majestic, MD  isosorbide mononitrate (IMDUR) 60 MG 24 hr tablet Take 1.5 tablets (90 mg total) by mouth daily. 03/10/23   Lanier Prude, MD  Multiple Vitamins-Minerals (CENTRUM SILVER ADULT 50+) TABS Take 1 tablet by mouth daily.    [provider]  nitroGLYCERIN (NITROSTAT) 0.4 MG SL tablet ONE TABLET UNDER TONGUE WHEN NEEDED FOR CHEST PAIN. MAY REPEAT IN 5 MINUTES. 09/22/21   Marykay Lex, MD  rosuvastatin (CRESTOR) 10 MG tablet TAKE (1) TABLET DAILY AT BEDTIME. 09/23/22   Shelva Majestic, MD  topiramate (TOPAMAX) 100 MG tablet Take 1 tablet (100 mg total) by mouth 2 (two) times daily. 03/30/23   Kirsteins, Victorino Sparrow, MD  traMADol (ULTRAM) 50 MG tablet Take 1 tablet (50 mg total) by mouth every 8 (eight) hours as needed for up  to 5 days. 01/14/23   Shelva Majestic, MD      Allergies    Patient has no known allergies.    Review of Systems   Review of Systems  Physical Exam Updated Vital Signs BP (!) 190/98   Pulse 66   Temp 97.7 F (36.5 C) (Oral)   Resp 12   Ht 5\' 8"  (1.727 m)   Wt 85.5 kg   SpO2 100%   BMI 28.66 kg/m  Physical Exam Constitutional:      General: He is not in acute distress. HENT:     Head: Normocephalic and atraumatic.  Eyes:     Conjunctiva/sclera: Conjunctivae normal.     Pupils: Pupils are equal, round, and reactive to light.  Cardiovascular:     Rate and Rhythm: Normal rate and regular rhythm.  Pulmonary:     Effort: Pulmonary effort is normal. No respiratory  distress.  Abdominal:     General: There is no distension.     Tenderness: There is no abdominal tenderness.  Skin:    General: Skin is warm and dry.  Neurological:     General: No focal deficit present.     Mental Status: He is alert and oriented to person, place, and time. Mental status is at baseline.     Cranial Nerves: No cranial nerve deficit.     Sensory: No sensory deficit.     Motor: No weakness.  Psychiatric:        Mood and Affect: Mood normal.        Behavior: Behavior normal.     ED Results / Procedures / Treatments   Labs (all labs ordered are listed, but only abnormal results are displayed) Labs Reviewed  CBC - Abnormal; Notable for the following components:      Result Value   RBC 3.43 (*)    Hemoglobin 11.2 (*)    HCT 35.8 (*)    MCV 104.4 (*)    Platelets 118 (*)    All other components within normal limits  COMPREHENSIVE METABOLIC PANEL - Abnormal; Notable for the following components:   CO2 21 (*)    Glucose, Bld 105 (*)    BUN 50 (*)    Creatinine, Ser 2.82 (*)    Calcium 8.6 (*)    Albumin 3.3 (*)    GFR, Estimated 21 (*)    All other components within normal limits  I-STAT CHEM 8, ED - Abnormal; Notable for the following components:   BUN 44 (*)    Creatinine, Ser 3.20 (*)    Glucose, Bld 102 (*)    Calcium, Ion 1.05 (*)    TCO2 17 (*)    Hemoglobin 11.9 (*)    HCT 35.0 (*)    All other components within normal limits  ETHANOL  PROTIME-INR  APTT  DIFFERENTIAL  RAPID URINE DRUG SCREEN, HOSP PERFORMED  URINALYSIS, ROUTINE W REFLEX MICROSCOPIC  CBG MONITORING, ED    EKG EKG Interpretation Date/Time:  Wednesday May 12 2023 14:25:42 EDT Ventricular Rate:  70 PR Interval:  274 QRS Duration:  88 QT Interval:  418 QTC Calculation: 451 R Axis:   -32  Text Interpretation: Sinus rhythm with 1st degree A-V block Left axis deviation Moderate voltage criteria for LVH, may be normal variant ( R in aVL , Cornell product ) Inferior infarct  , age undetermined Anterolateral infarct , age undetermined Abnormal ECG When compared with ECG of 24-Mar-2023 11:27, PREVIOUS ECG IS PRESENT Confirmed by Alvester Chou (  [id number]) on 05/12/2023 3:24:44 PM  Radiology MR BRAIN WO CONTRAST  Result Date: 05/12/2023 CLINICAL DATA:  Transient ischemic attack (TIA) dysarthria, resolved EXAM: MRI HEAD WITHOUT CONTRAST TECHNIQUE: Multiplanar, multiecho pulse sequences of the brain and surrounding structures were obtained without intravenous contrast. COMPARISON:  Brain MR 10/25/22 FINDINGS: Brain: Negative for an acute infarct. No acute hemorrhage. No hydrocephalus. No extra-axial fluid collection. Chronic bilateral cerebellar, left occipital lobe, and left parietal lobe infarcts.Unchanged arachnoid cyst in the left middle cranial fossa. Sequela of moderate chronic microvascular ischemic change. Generalized volume loss. Vascular: Normal flow voids. Skull and upper cervical spine: Normal marrow signal. Sinuses/Orbits: No middle ear or mastoid effusion. Paranasal sinuses are clear. Bilateral lens replacement. Orbits are otherwise unremarkable Other: None. IMPRESSION: No acute intracranial process. Electronically Signed   By: Lorenza Cambridge M.D.   On: 05/12/2023 17:58   CT Head Wo Contrast  Result Date: 05/12/2023 CLINICAL DATA:  Neuro deficit, acute, stroke suspected. Two episodes of transient aphasia. History of stroke. EXAM: CT HEAD WITHOUT CONTRAST TECHNIQUE: Contiguous axial images were obtained from the base of the skull through the vertex without intravenous contrast. RADIATION DOSE REDUCTION: This exam was performed according to the departmental dose-optimization program which includes automated exposure control, adjustment of the mA and/or kV according to patient size and/or use of iterative reconstruction technique. COMPARISON:  Head CT 12/23/2022 and MRI 12/25/2022 FINDINGS: Brain: There is no evidence of an acute infarct, intracranial hemorrhage, mass, midline  shift, or extra-axial fluid collection. Patchy hypodensities in the cerebral white matter are unchanged and nonspecific but compatible with moderate chronic small vessel ischemic disease. Small chronic left parietal, left occipital, bilateral basal ganglia, and bilateral cerebellar infarcts are again seen. There is an unchanged left middle cranial fossa arachnoid cyst. There is mild cerebral atrophy. Vascular: Calcified atherosclerosis at the skull base. No hyperdense vessel. Skull: No acute fracture or suspicious osseous lesion. Sinuses/Orbits: Visualized paranasal sinuses and mastoid air cells are clear. Bilateral cataract extraction. Other: None. IMPRESSION: 1. No evidence of acute intracranial abnormality. 2. Moderate chronic small vessel ischemic disease. Electronically Signed   By: Sebastian Ache M.D.   On: 05/12/2023 16:32    Procedures Procedures    Medications Ordered in ED Medications - No data to display  ED Course/ Medical Decision Making/ A&P Clinical Course as of 05/12/23 2015  Wed May 12, 2023  1536 NIHSS 0 on my initial evaluation [MT]    Clinical Course User Index [MT] Trinda Harlacher, Mark Balo, MD                                 Medical Decision Making Amount and/or Complexity of Data Reviewed Labs: ordered. Radiology: ordered.   This patient presents to the ED with concern for expressive aphasia and difficulty with speech. This involves an extensive number of treatment options, and is a complaint that carries with it a high risk of complications and morbidity.  The differential diagnosis includes TIA versus CVA versus metabolic encephalopathy versus other  Co-morbidities that complicate the patient evaluation: Several cardiovascular and stroke risk factors including age, hypertension, prior stroke, A-fib  Additional history obtained from patient's family members at the bedside  External records from outside source obtained and reviewed including MRI of the brain from May 2023  and prior stroke records  I ordered and personally interpreted labs.  The pertinent results include: No emergent findings.  Patient has chronic kidney disease, unchanged from  prior.  I ordered imaging studies including CT and MR imaging of the brain I independently visualized and interpreted imaging which showed no emergent findings I agree with the radiologist interpretation  The patient was maintained on a cardiac monitor.  I personally viewed and interpreted the cardiac monitored which showed an underlying rhythm of: A-fib, rate controlled  Per my interpretation the patient's ECG shows rate controlled A-fib  I have reviewed the patients home medicines and have made adjustments as needed  Test Considered: Low suspicion for meningitis, acute PE.  No indication for CT angiogram imaging, or lumbar puncture at this time  I discussed case by phone with neurologist Dr. Otelia Limes.  Given that there were no emergent findings on patient's MRI or CT, we will plan for outpatient follow-up with neurology and to continue the patient's Eliquis dosing.  We will not start additional antiplatelet medication or blood thinning medication given his age and comorbidities.  The patient has had a recent echocardiogram within the past year.  He also had a vascular ultrasound which does show some stenosis of the right ICA -and for this again he can follow-up with his neurologist.  After the interventions noted above, I reevaluated the patient and found that they have: improved  During the patient's nearly 6-hour observation stay in the ED he remained at baseline within a short scale of 0, no active stroke symptoms.  The patient, as well as his son and family are comfortable taking him home.  The patient does stay with family members and has a home nurse that checks on him.  Return precautions were discussed  Dispostion:  After consideration of the diagnostic results and the patients response to treatment, I feel  that the patent would benefit from close outpatient follow-up.         Final Clinical Impression(s) / ED Diagnoses Final diagnoses:  Dysarthria    Rx / DC Orders ED Discharge Orders     None         Trisha Ken, Mark Balo, MD 05/12/23 2015

## 2023-05-20 ENCOUNTER — Telehealth: Payer: Self-pay

## 2023-05-20 ENCOUNTER — Ambulatory Visit: Payer: Self-pay

## 2023-05-20 NOTE — Patient Outreach (Signed)
Care Coordination   Follow Up Visit Note   05/20/2023 Name: Mark Jackson. MRN: [id number] DOB: [birth year]  Mark Pao. is a 89 y.o. year old male who sees Hunter, Aldine Contes, MD for primary care. I spoke with  Mark Pao. by phone today.  What matters to the patients health and wellness today?  Maintain health     Goals Addressed             This Visit's Progress    A,Fib managment       Care Coordination Interventions: Counseled on increased risk of stroke due to Afib and benefits of anticoagulation for stroke prevention Reviewed importance of adherence to anticoagulant exactly as prescribed  Patient reports doing well. Recent ED visit for dysarthia.  However he states "I am still here" Weight remains stable  181-183 lbs. Reports blood pressure doing good.  Reviewed  HTN and A.Fib. management.  No concerns.         SDOH assessments and interventions completed:  Yes     Care Coordination Interventions:  Yes, provided   Follow up plan: Follow up call scheduled for November    Encounter Outcome:  Patient Visit Completed   Mark Leriche, RN, MSN [place]  [facility], [facility] Management Community Coordinator Direct Dial: [phone]  Fax: [phone] Website: [email or web address]

## 2023-05-20 NOTE — Patient Instructions (Signed)
Visit Information  Thank you for taking time to visit with me today. Please don't hesitate to contact me if I can be of assistance to you.   Following are the goals we discussed today:   Goals Addressed             This Visit's Progress    A,Fib managment       Care Coordination Interventions: Counseled on increased risk of stroke due to Afib and benefits of anticoagulation for stroke prevention Reviewed importance of adherence to anticoagulant exactly as prescribed  Patient reports doing well. Recent ED visit for dysarthia.  However he states "I am still here" Weight remains stable  181-183 lbs. Reports blood pressure doing good.  Reviewed  HTN and A.Fib. management.  No concerns.         Our next appointment is by telephone on 06/24/23 at 100 pm  Please call the care guide team at [phone] if you need to cancel or reschedule your appointment.   If you are experiencing a Mental Health or Behavioral Health Crisis or need someone to talk to, please call the Suicide and Crisis Lifeline: 988   Patient verbalizes understanding of instructions and care plan provided today and agrees to view in MyChart. Active MyChart status and patient understanding of how to access instructions and care plan via MyChart confirmed with patient.     The patient has been provided with contact information for the care management team and has been advised to call with any health related questions or concerns.   Bary Leriche, RN, MSN [facility], [facility] Management Community Coordinator Direct Dial: [phone]  Fax: [phone] Website: [email or web address]

## 2023-05-20 NOTE — Patient Outreach (Signed)
Care Coordination   05/20/2023 Name: Mark Jackson. MRN: [id number] DOB: 07/08/1930   Care Coordination Outreach Attempts:  An unsuccessful telephone outreach was attempted for a scheduled appointment today.  Follow Up Plan:  Additional outreach attempts will be made to offer the patient care coordination information and services.   Encounter Outcome:  No Answer   Care Coordination Interventions:  No, not indicated    Bary Leriche, RN, MSN [facility]  [facility], [facility] Management Community Coordinator Direct Dial: [phone]  Fax: [phone] Website: [email or web address]

## 2023-05-24 ENCOUNTER — Ambulatory Visit (INDEPENDENT_AMBULATORY_CARE_PROVIDER_SITE_OTHER): Payer: Medicare Other

## 2023-05-24 DIAGNOSIS — Z87898 Personal history of other specified conditions: Secondary | ICD-10-CM

## 2023-05-25 LAB — CUP PACEART REMOTE DEVICE CHECK
Date Time Interrogation Session: 20241013231613
Implantable Pulse Generator Implant Date: 20240530

## 2023-05-29 ENCOUNTER — Other Ambulatory Visit: Payer: Self-pay | Admitting: Family Medicine

## 2023-06-08 NOTE — Progress Notes (Signed)
Carelink Summary Report / Loop Recorder 

## 2023-06-13 NOTE — Progress Notes (Unsigned)
Cardiology Office Note    Date:  06/14/2023  ID:  [name], DOB [birth year], MRN [id number] PCP:  Shelva Majestic, MD  Cardiologist:  Bryan Lemma, MD  Electrophysiologist:  Lanier Prude, MD   Chief Complaint: Follow up for syncope, CAD and atrial fibrillation   History of Present Illness: .    Mark Jackson. is a 89 y.o. male with visit-pertinent history of CAD s/p DES x 2 overlapping- proximal LAD, aortic stenosis s/p TAVR, paroxysmal atrial fibrillation, sinus pause, carotid artery stenosis, CVA in 12/2021, TIA in 05/2023, chronic bilateral lower extremity edema, hypertension, hyperlipidemia, and CKD stage IV who presents for follow up related to CAD and atrial fibrillation.   He has a history of CAD s/p DES x 2 overlapping proximal LAD, in 2017 and 2020.  He underwent cardiac catheterization in September 2020 revealing patent stents, otherwise nonobstructive disease.  Not on antiplatelet therapy in setting of chronic DOAC therapy due to atrial fibrillation.  He has a history of aortic stenosis s/p TAVR.  Echocardiogram in 12/2022 showed EF 55 to 60%, normal LV function, no RWMA, G1 DD, normal RV, degenerative mitral valve, trivial mitral valve regurgitation, no aortic stenosis, stable aortic valve prosthesis, mean gradient 14 mmHg.  He was hospitalized in May 2024 in the setting of a 6.4-second sinus pause on outpatient cardiac monitor.  Diltiazem was discontinued.  He returned to the ED in June 2024 in the setting of recurrent syncope, now s/p ILR, follows with the EP.  Most recent carotid Dopplers in 12/2022 revealed 80 to 90% right ICA stenosis, greater than 50% right ECA stenosis, 1 to 39% LICA stenosis.  When seen by Dr. Lalla Brothers on 03/10/2023 he was noted to have an episode of chest pain, Imdur was increased to 90 mg daily, BP was mildly elevated.  He was last seen in clinic on 03/24/2023, he reported that he been stable from a cardiac standpoint.  He did receive  notification of a 3-second pause that occurred on 03/21/2023.  He notes intermittent dizziness, denies any presyncope or syncope.  He was noted to have stable chronic bilateral lower extremity edema.  On 05/12/2023 he presented to the emergency room with difficulty with speech.  Patient had 2 witnessed episodes of aphasia or dysarthria that lasted about 10 minutes.  On arrival both episodes had completely resolved.  He reported compliance with his Eliquis.  There were no emergent findings on patient's MRI or CT, planned to follow-up with neurology in outpatient setting.  Today [name] presents for follow up accompanied by his nurse. He reports that he is doing "well for almost 80." He has no concerns or complaints today. He notes very rare episode of chest discomfort, quickly resolves with rest. He denies any further episodes of aphasia since ED visit, per patient and nurse he was discharged from neurology. He has chronic bilateral lower extremity edema, denies PND, orthopnea. He reports that he is doing well and would prefer not to change anything. He notes occasional dizziness that is overall unchanged, he denies any further presyncope or syncope.   Labwork independently reviewed: 05/12/2023: Sodium 139, potassium 4.3, creatinine 2.82, albumin 3.3, hemoglobin 11.2, hematocrit 35.8  ROS: .   Today he denies shortness of breath, palpitations, melena, hematuria, hemoptysis, diaphoresis, weakness, presyncope, syncope, orthopnea, and PND.  All other systems are reviewed and otherwise negative. Studies Reviewed: Marland Kitchen    EKG:  EKG is ordered today, personally reviewed, demonstrating  EKG Interpretation Date/Time:  Monday June 14 2023 11:15:22 EST Ventricular Rate:  75 PR Interval:  270 QRS Duration:  98 QT Interval:  394 QTC Calculation: 439 R Axis:   -44  Text Interpretation: Sinus rhythm with 1st degree A-V block Left axis deviation Minimal voltage criteria for LVH Confirmed by Reather Littler  [phone]) on 06/14/2023 11:27:06 AM    CV Studies:  Cardiac Studies & Procedures   CARDIAC CATHETERIZATION  CARDIAC CATHETERIZATION 04/26/2019  Narrative  There is MODERATE-SEVEREAORTIC VALVE STENOSIS.  LV end diastolic pressure is normal.  Previously placed Ost-Prox Overlappiing LAD drug eluting stents are widely patent.  Otherwise angiographically normal coronary arteries.  SUMMARY  Single-vessel CAD with widely patent overlapping ostial-proximal LAD stents.  Left dominant system  Moderate to severe aortic stenosis with P-P gradient of 36 mmHg and mean gradient of 31 mmHg  Normal LVEDP  RECOMMENDATIONS  We will run 4 hours of IV fluids post procedure but would be okay to discharge tonight.  Continue Imdur as directed  Restart Eliquis tonight at 2200  We will order outpatient echocardiogram to be done prior to his follow-up visit to reevaluate aortic valve for progression of aortic stenosis.    Bryan Lemma, MD  Findings Coronary Findings Diagnostic  Dominance: Left  Left Main Vessel was injected. Vessel is large. Vessel is angiographically normal.  Left Anterior Descending Vessel was injected. Vessel is normal in caliber. Vessel is angiographically normal. Previously placed Ost LAD drug eluting stent is widely patent. The lesion is smooth. Overlapping Synergy DES stent -Synergy DES 3.0 x12 Previously placed Prox LAD drug eluting stent is widely patent. The lesion is smooth. Focal in-stent restenosis Synergy DES 2.75 x 16 (3.3) overlapping stent  First Diagonal Branch Vessel was injected. Vessel is normal in size. Vessel is moderate-large size Vessel is angiographically normal.  Lateral First Diagonal Branch Vessel was injected. Vessel is small in size. Vessel is angiographically normal.  First Septal Branch Vessel was injected. Vessel is moderate in size. Vessel is angiographically normal.  Second Septal Branch Vessel was injected. Vessel is small in  size. Vessel is angiographically normal.  Third Septal Branch Vessel was injected. Vessel is small in size. Vessel is angiographically normal.  Ramus Intermedius Vessel was injected. Vessel is large. Vessel is angiographically normal.  Left Circumflex Vessel was injected. Vessel is large. Vessel is angiographically normal.  First Obtuse Marginal Branch Vessel was injected. Vessel is small in size. Vessel is angiographically normal.  Second Obtuse Marginal Branch Vessel was injected. Vessel is small in size. Vessel is angiographically normal.  Left Posterior Descending Artery Vessel was injected. Vessel is moderate in size. Vessel is angiographically normal.  First Left Posterolateral Branch Vessel was injected. Vessel is moderate in size. Vessel is angiographically normal.  Second Left Posterolateral Branch Vessel was injected. Vessel is small in size. Vessel is angiographically normal.  Right Coronary Artery Vessel was injected. Vessel is normal in caliber. Vessel is angiographically normal.  Acute Marginal Branch Vessel was injected. Vessel is small in size. Vessel is angiographically normal.  Right Ventricular Branch Vessel was injected. Vessel is small in size. Vessel is angiographically normal.  Intervention  No interventions have been documented.   CARDIAC CATHETERIZATION  CARDIAC CATHETERIZATION 06/09/2016  Narrative Images from the original result were not included.   Ost LAD lesion, 85 %stenosed. FFR highly positive  Prox LAD-1 Synergy DES stent, focal 40 % in-stent restenosis.  A STENT SYNERGY DES 3X12 drug eluting stent was successfully placed, and overlaps previously placed stent.  Post intervention, there is a 0% residual stenosis.   Successful FFR guided PCI of ostial LAD lesion just prior to the stent covering a small focal area of in-stent restenosis in the previously placed stent.  Plan: Based on his renal function, we will observe him  overnight with post-cath hydration. I don't think that he is a good early discharge candidate.  He will be on aspirin plus Plavix for one month, we will then stop aspirin and restart Eliquis.  Continue diltiazem and statin. He is beta blocker intolerant due to fatigue.  Monitor blood pressure, but would be hesitant to titrate up anti-hypertensive medications prior to discharge.Bryan Lemma, M.D., M.S. Interventional Cardiologist  Pager # [phone] Phone # [phone] [address] [place], [place] [address]  Findings Coronary Findings Diagnostic  Dominance: Left  Left Main Vessel is large.  Left Anterior Descending The lesion is discrete and smooth. Pressure wire/FFR was performed on the lesion. FFR: 0.72. Baseline FFR was 0.86-0.87.-- Infusion was discontinued when it went as low as 0.76, but there was one recording of 0.72. The lesion is discrete. Previously placed Prox LAD-2 drug eluting stent is widely patent. Focal in-stent restenosis  First Diagonal Branch Vessel is moderate in size.  Lateral First Diagonal Branch Vessel is small in size.  First Septal Branch Vessel is moderate in size.  Second Septal Branch Vessel is small in size.  Third Septal Branch Vessel is small in size.  Ramus Intermedius Vessel is angiographically normal.  Left Circumflex Vessel is large. Vessel is angiographically normal.  First Obtuse Marginal Branch Vessel is small in size. Vessel is angiographically normal.  Second Obtuse Marginal Branch Vessel is small in size. Vessel is angiographically normal.  Left Posterior Descending Artery Vessel is moderate in size. Vessel is angiographically normal.  First Left Posterolateral Branch Vessel is moderate in size. Vessel is angiographically normal.  Second Left Posterolateral Branch Vessel is small in size. Vessel is angiographically normal.  Right Coronary Artery Vessel is small.  Intervention  Ost LAD  lesion Angioplasty (Also treats lesions: Prox LAD-1, and Prox LAD-2) Lesion length: 10 mm. Lesion crossed with guidewire using a GUIDEWIRE COMET PRESSURE. Pre-stent angioplasty was not performed. A STENT SYNERGY DES 3X12 drug eluting stent was successfully placed, and overlaps previously placed stent. Minimum lumen area: 3.5 mm. Stent strut is well apposed. Post-stent angioplasty was performed using a BALLOON [place] EUPHORA RX3.5X8. Maximum pressure: 18 atm. Inflation time: 20 sec. The pre-interventional distal flow is normal (TIMI 3).  The post-interventional distal flow is normal (TIMI 3). The intervention was successful . No complications occurred at this lesion. Pre-PCI GUIDE CATH RUNWAY 6FR CLS3.5 ([id number]) There is a 0% residual stenosis post intervention.  Prox LAD-1 lesion Angioplasty (Also treats lesions: Ost LAD, and Prox LAD-2) See details in Ost LAD lesion. There is a 0% residual stenosis post intervention.  Prox LAD-2 lesion Angioplasty (Also treats lesions: Ost LAD, and Prox LAD-1) See details in Ost LAD lesion. There is a 0% residual stenosis post intervention.   STRESS TESTS  MYOCARDIAL PERFUSION IMAGING 05/12/2018  Interpretation Summary  Nuclear stress EF: 56%. The left ventricular ejection fraction is normal (55-65%).  There was no ST segment deviation noted during stress.  The study is normal.  This is a low risk study. There is no evidence of ischemia. No evidence of previous infarction.   ECHOCARDIOGRAM  ECHOCARDIOGRAM COMPLETE 12/24/2022  Narrative ECHOCARDIOGRAM REPORT    Patient Name:   Mark Jackson  Montez Hageman. Date of Exam: 12/24/2022 Medical Rec #:  [id number]          Height:       68.0 in Accession #:    [id number]         Weight:       187.5 lb Date of Birth:  08/16/1929         BSA:          1.988 m Patient Age:    89 years           BP:           157/68 mmHg Patient Gender: M                  HR:           62 bpm. Exam Location:   Inpatient  Procedure: 2D Echo, Cardiac Doppler and Color Doppler  Indications:    Elevated Troponin  History:        Patient has prior history of Echocardiogram examinations, most recent 12/27/2021. CHF, CAD, Signs/Symptoms:Syncope and Dyspnea; Risk Factors:Hypertension and Dyslipidemia. CKD, stage 4. Aortic Valve: 26 mm Edwards Sapien prosthetic, stented (TAVR) valve is present in the aortic position. Procedure Date: 12/12/19.  Sonographer:    Lucendia Herrlich Referring Phys: Emeline General  IMPRESSIONS   1. Left ventricular ejection fraction, by estimation, is 55 to 60%. The left ventricle has normal function. The left ventricle has no regional wall motion abnormalities. Left ventricular diastolic parameters are consistent with Grade I diastolic dysfunction (impaired relaxation). 2. Right ventricular systolic function is normal. The right ventricular size is normal. 3. The mitral valve is degenerative. Trivial mitral valve regurgitation. No evidence of mitral stenosis. 4. The aortic valve has been repaired/replaced. Aortic valve regurgitation is not visualized. No aortic stenosis is present. There is a 26 mm Edwards Sapien prosthetic (TAVR) valve present in the aortic position. Procedure Date: 12/12/19. Aortic valve area, by VTI measures 1.85 cm. Aortic valve mean gradient measures 14.0 mmHg. Aortic valve Vmax measures 2.57 m/s. DVI 0.59. 5. The inferior vena cava is normal in size with greater than 50% respiratory variability, suggesting right atrial pressure of 3 mmHg 6. COmpared to study dated 12/27/21, the mean TAVR gradient has increased from 10 to , Vmax has increased from 2.13 to 2.38m/s and DVI has decreased from 0.74 to 0.59.  FINDINGS Left Ventricle: Left ventricular ejection fraction, by estimation, is 55 to 60%. The left ventricle has normal function. The left ventricle has no regional wall motion abnormalities. The left ventricular internal cavity size was normal in size.  There is no left ventricular hypertrophy. Left ventricular diastolic parameters are consistent with Grade I diastolic dysfunction (impaired relaxation). Normal left ventricular filling pressure.  Right Ventricle: The right ventricular size is normal. No increase in right ventricular wall thickness. Right ventricular systolic function is normal.  Left Atrium: Left atrial size was normal in size.  Right Atrium: Right atrial size was normal in size.  Pericardium: There is no evidence of pericardial effusion.  Mitral Valve: The mitral valve is degenerative in appearance. There is moderate calcification of the anterior mitral valve leaflet(s). Mild mitral annular calcification. Trivial mitral valve regurgitation. No evidence of mitral valve stenosis.  Tricuspid Valve: The tricuspid valve is normal in structure. Tricuspid valve regurgitation is trivial. No evidence of tricuspid stenosis.  Aortic Valve: The aortic valve has been repaired/replaced. Aortic valve regurgitation is not visualized. No aortic stenosis is present. Aortic valve mean gradient measures 14.0  mmHg. Aortic valve peak gradient measures 26.5 mmHg. Aortic valve area, by VTI measures 1.85 cm. There is a 26 mm Edwards Sapien prosthetic, stented (TAVR) valve present in the aortic position. Procedure Date: 12/12/19.  Pulmonic Valve: The pulmonic valve was normal in structure. Pulmonic valve regurgitation is not visualized. No evidence of pulmonic stenosis.  Aorta: The aortic root is normal in size and structure.  Venous: The inferior vena cava is normal in size with greater than 50% respiratory variability, suggesting right atrial pressure of 3 mmHg.  IAS/Shunts: No atrial level shunt detected by color flow Doppler.   LEFT VENTRICLE PLAX 2D LVIDd:         4.00 cm   Diastology LVIDs:         2.80 cm   LV e' medial:    6.42 cm/s LV PW:         1.20 cm   LV E/e' medial:  12.4 LV IVS:        0.90 cm   LV e' lateral:   7.62 cm/s LVOT  diam:     2.00 cm   LV E/e' lateral: 10.5 LV SV:         109 LV SV Index:   55 LVOT Area:     3.14 cm   RIGHT VENTRICLE RV S prime:     10.40 cm/s TAPSE (M-mode): 2.1 cm  LEFT ATRIUM             Index        RIGHT ATRIUM           Index LA diam:        5.00 cm 2.51 cm/m   RA Area:     16.90 cm LA Vol (A2C):   77.8 ml 39.10 ml/m  RA Volume:   38.40 ml  19.31 ml/m LA Vol (A4C):   50.7 ml 25.50 ml/m LA Biplane Vol: 66.3 ml 33.34 ml/m AORTIC VALVE AV Area (Vmax):    1.79 cm AV Area (Vmean):   1.84 cm AV Area (VTI):     1.85 cm AV Vmax:           257.33 cm/s AV Vmean:          169.500 cm/s AV VTI:            0.589 m AV Peak Grad:      26.5 mmHg AV Mean Grad:      14.0 mmHg LVOT Vmax:         146.80 cm/s LVOT Vmean:        99.420 cm/s LVOT VTI:          0.348 m LVOT/AV VTI ratio: 0.59  AORTA Ao Root diam: 2.80 cm Ao Asc diam:  3.60 cm  MITRAL VALVE                TRICUSPID VALVE MV Area (PHT): 5.02 cm     TR Peak grad:   21.3 mmHg MV Decel Time: 151 msec     TR Vmax:        231.00 cm/s MV E velocity: 79.90 cm/s MV A velocity: 142.00 cm/s  SHUNTS MV E/A ratio:  0.56         Systemic VTI:  0.35 m Systemic Diam: 2.00 cm  Armanda Magic MD Electronically signed by Armanda Magic MD Signature Date/Time: 12/24/2022/1:51:04 PM    Final   TEE  ECHO TEE 12/12/2019  Narrative TRANSESOPHOGEAL ECHO REPORT    Patient Name:  [name] Date of Exam: 12/12/2019 Medical Rec #:  [id number]          Height:       68.0 in Accession #:    [id number]         Weight:       187.2 lb Date of Birth:  [birth year]         BSA:          1.987 m Patient Age:    89 years           BP:           188/79 mmHg Patient Gender: M                  HR:           80 bpm. Exam Location:  Inpatient  Procedure: Transesophageal Echo, Color Doppler and Cardiac Doppler  Indications:     Aortic stenosis 424.1 / I35.0  History:         Patient has prior history of Echocardiogram  examinations, most recent 11/28/2019. CAD, Arrythmias:Atrial Fibrillation, Signs/Symptoms:Shortness of Breath; Risk Factors:Hypertension and Dyslipidemia. Chronic kidney disease, moderate carotid artery disease, anemia. Aortic Valve: 26 mm valve is present in the aortic position. Procedure Date: 12/12/2019.  Sonographer:     Leta Jungling RDCS Referring Phys:  [phone] MICHAEL COOPER Diagnosing Phys: Tobias Alexander MD  PROCEDURE: The transesophogeal probe was passed without difficulty through the esophogus of the patient. Sedation performed by different physician. The patient's vital signs; including heart rate, blood pressure, and oxygen saturation; remained stable throughout the procedure. The patient developed no complications during the procedure.  IMPRESSIONS   1. This was a periprocedural TEE during a TAVR procedure. A 26 mmEdwards SAPIEN 3 Ultra valve was delivered successfully via subclavian access. Peak/mean transaortic gradients improved from peak/mean 45/26 mmHg to 5/2 mmHg. There was trivial paravalvular leak and trivial central regurgitation post procedure. No pericardial effusion was seen during and post procedure. 2. Left ventricular ejection fraction, by estimation, is 60 to 65%. The left ventricle has normal function. The left ventricle has no regional wall motion abnormalities. Left ventricular diastolic function could not be evaluated. 3. Right ventricular systolic function is normal. The right ventricular size is normal. 4. Left atrial size was mild to moderately dilated. No left atrial/left atrial appendage thrombus was detected. 5. The mitral valve is normal in structure. Mild to moderate mitral valve regurgitation. No evidence of mitral stenosis. 6. The aortic valve is normal in structure. Aortic valve regurgitation is mild. Moderate to severe aortic valve stenosis. There is a 26 mm valve present in the aortic position. Procedure Date: 12/12/2019. Aortic valve mean  gradient measures 26.0 mmHg. 7. There is mild (Grade II) plaque. 8. The inferior vena cava is normal in size with greater than 50% respiratory variability, suggesting right atrial pressure of 3 mmHg.  Conclusion(s)/Recommendation(s): Normal biventricular function without evidence of hemodynamically significant valvular heart disease.  FINDINGS Left Ventricle: Left ventricular ejection fraction, by estimation, is 60 to 65%. The left ventricle has normal function. The left ventricle has no regional wall motion abnormalities. The left ventricular internal cavity size was normal in size. There is no left ventricular hypertrophy. Left ventricular diastolic function could not be evaluated.  Right Ventricle: The right ventricular size is normal. No increase in right ventricular wall thickness. Right ventricular systolic function is normal.  Left Atrium: Left atrial size was mild to moderately dilated. No left atrial/left atrial appendage thrombus  was detected.  Right Atrium: Right atrial size was normal in size.  Pericardium: There is no evidence of pericardial effusion.  Mitral Valve: The mitral valve is normal in structure. Normal mobility of the mitral valve leaflets. Mild to moderate mitral valve regurgitation. No evidence of mitral valve stenosis.  Tricuspid Valve: The tricuspid valve is normal in structure. Tricuspid valve regurgitation is mild . No evidence of tricuspid stenosis.  Aortic Valve: The aortic valve is normal in structure. Aortic valve regurgitation is mild. Moderate to severe aortic stenosis is present. There is severe calcifcation of the aortic valve. Aortic valve mean gradient measures 26.0 mmHg. Aortic valve peak gradient measures 18.2 mmHg. Aortic valve area, by VTI measures 0.72 cm. There is a 26 mm valve present in the aortic position. Procedure Date: 12/12/2019.  Pulmonic Valve: The pulmonic valve was normal in structure. Pulmonic valve regurgitation is not visualized.  No evidence of pulmonic stenosis.  Aorta: The aortic root is normal in size and structure. There is mild (Grade II) plaque.  Venous: The inferior vena cava is normal in size with greater than 50% respiratory variability, suggesting right atrial pressure of 3 mmHg.  IAS/Shunts: No atrial level shunt detected by color flow Doppler.   LEFT VENTRICLE PLAX 2D LVIDd:         4.20 cm LVIDs:         3.00 cm LVOT diam:     2.10 cm LV SV:         37 LV SV Index:   19 LVOT Area:     3.46 cm   AORTIC VALVE AV Area (Vmax):    0.96 cm AV Area (Vmean):   0.61 cm AV Area (VTI):     0.72 cm AV Vmax:           213.50 cm/s AV Vmean:          150.650 cm/s AV VTI:            0.520 m AV Peak Grad:      18.2 mmHg AV Mean Grad:      26.0 mmHg LVOT Vmax:         59.20 cm/s LVOT Vmean:        26.500 cm/s LVOT VTI:          0.108 m LVOT/AV VTI ratio: 0.21  MR Peak grad:    143.0 mmHg MR Mean grad:    89.5 mmHg   SHUNTS MR Vmax:         598.00 cm/s Systemic VTI:  0.11 m MR Vmean:        438.5 cm/s  Systemic Diam: 2.10 cm MR PISA:         1.90 cm MR PISA Eff ROA: 12 mm MR PISA Radius:  0.55 cm  Tobias Alexander MD Electronically signed by Tobias Alexander MD Signature Date/Time: 12/12/2019/2:08:34 PM    Final (Updated)   MONITORS  LONG TERM MONITOR-LIVE TELEMETRY (3-14 DAYS) 01/19/2023  Narrative   7-day Zio patch monitor-May 2024   Predominant Rhythm = Sinus: HR range 54-105 bpm, Avg 73 bpm   Rare PACs and rare PVCs with rare couplets/triplets.   1 pause noted 6.4 Sec (patient sent to the ER) - BB completely stopped - ILR inserted. ->  Pauses actually Complete Heart Block as P waves are noted without QRS complexes.   1 spell of bradycardia with Mobitz type II AV block-HR range 36 bpm.   3 atrial runs: Fastest and longest run was 10  beats (5.7 sec), HR range 92 and 118 bpm, avg 109 bpm; longest   Also IVR noted.   No Sustained Arrhythmias: Atrial Tachycardia (AT), Supraventricular  Tachycardia (SVT), Atrial Fibrillation (A-Fib), Atrial Flutter (A-Flutter), Sustained Ventricular Tachycardia (VT)   Results were reviewed as the patient was in the hospital.  Bryan Lemma, MD   CT SCANS  CT CORONARY [facility] W/CTA COR W/SCORE 11/29/2019  Addendum 11/29/2019 11:12 PM ADDENDUM REPORT: 11/29/2019 23:09  CLINICAL DATA:  89 year old male with severe aortic stenosis being evaluated for a TAVR procedure.  EXAM: Cardiac TAVR CT  TECHNIQUE: The patient was scanned on a [employer]. A 120 kV retrospective scan was triggered in the descending thoracic aorta at 111 HU's. Gantry rotation speed was 250 msecs and collimation was .6 mm. No beta blockade or nitro were given. The 3D data set was reconstructed in 5% intervals of the R-R cycle. Systolic and diastolic phases were analyzed on a dedicated work station using MPR, MIP and VRT modes. The patient received 60 cc of contrast.  FINDINGS: Aortic Valve: Trileaflet, with severely thickened and calcified leaflets and no calcifications extending into the LVOT.  Aorta: Normal size, very mild diffuse atherosclerotic plaque and calcifications. No dissection.  Sinotubular Junction: 29 x 28 mm  Ascending Thoracic Aorta: 35 x 34 mm  Aortic Arch: 26 x 25 mm  Descending Thoracic Aorta: 24 x 23 mm  Sinus of Valsalva Measurements:  Non-coronary: 33 mm  Right -coronary: 33 mm  Left -coronary: 33 mm  Coronary Artery Height above Annulus:  Left Main: 13.6 mm  Right Coronary: 18.3 mm  Virtual Basal Annulus Measurements:  Maximum/Minimum Diameter: 29.9 x 22.8 mm  Mean Diameter: 25.3 mm  Perimeter: 82.5 mm  Area: 505 mm2  Optimum Fluoroscopic Angle for Delivery: LAO 11 CAU 8  IMPRESSION: 1. Trileaflet, with severely thickened and calcified leaflets and no calcifications extending into the LVOT. Aortic valve calcium score 2255 consistent with severe aortic stenosis. Annular measurements suitable for  delivery of a 26 mm Edwards-SAPIEN 3 Ultra valve.  2. Sufficient coronary to annulus distance.  3. Optimum Fluoroscopic Angle for Delivery:  LAO 11 CAU 8.  4. No thrombus in the left atrial appendage.   Electronically Signed By: Tobias Alexander On: 11/29/2019 23:09  Narrative EXAM: OVER-READ INTERPRETATION  CT CHEST  The following report is an over-read performed by radiologist Dr. Trudie Reed of [facility] on 11/29/2019. This over-read does not include interpretation of cardiac or coronary anatomy or pathology. The coronary calcium score/coronary CTA interpretation by the cardiologist is attached.  COMPARISON:  None.  FINDINGS: Extracardiac findings will be described separately under dictation for contemporaneously obtained CTA chest, abdomen and pelvis dated 11/29/2019.  IMPRESSION: Please see separate dictation for contemporaneously obtained CTA chest, abdomen and pelvis 11/29/2019 for full description of relevant extracardiac findings.  Electronically Signed: By: Trudie Reed M.D. On: 11/29/2019 16:13          Current Reported Medications:.    Current Meds  Medication Sig   acetaminophen (TYLENOL) 325 MG tablet Take 2 tablets (650 mg total) by mouth every 4 (four) hours as needed for mild pain (or temp > 37.5 C (99.5 F)).   amLODipine (NORVASC) 5 MG tablet Take 0.5 tablets (2.5 mg total) by mouth daily.   amoxicillin (AMOXIL) 500 MG capsule Take 500 mg by mouth as needed (Use for pre dental procedure).   apixaban (ELIQUIS) 2.5 MG TABS tablet Take 1 tablet (2.5 mg total) by mouth  2 (two) times daily.   diclofenac Sodium (VOLTAREN) 1 % GEL Apply 2 g topically 4 (four) times daily. (Patient taking differently: Apply 2 g topically as needed. As needed)   ezetimibe (ZETIA) 10 MG tablet Take 1 tablet (10 mg total) by mouth daily.   ferrous sulfate 325 (65 FE) MG tablet Take 325 mg by mouth daily with breakfast.   FLUoxetine (PROZAC) 10 MG capsule  TAKE ONE CAPSULE BY MOUTH DAILY   furosemide (LASIX) 40 MG tablet Take 1 tablet (40 mg total) by mouth daily.   isosorbide mononitrate (IMDUR) 60 MG 24 hr tablet Take 1.5 tablets (90 mg total) by mouth daily.   Multiple Vitamins-Minerals (CENTRUM SILVER ADULT 50+) TABS Take 1 tablet by mouth daily.   nitroGLYCERIN (NITROSTAT) 0.4 MG SL tablet ONE TABLET UNDER TONGUE WHEN NEEDED FOR CHEST PAIN. MAY REPEAT IN 5 MINUTES.   rosuvastatin (CRESTOR) 10 MG tablet TAKE (1) TABLET DAILY AT BEDTIME.   topiramate (TOPAMAX) 100 MG tablet Take 1 tablet (100 mg total) by mouth 2 (two) times daily.   traMADol (ULTRAM) 50 MG tablet Take 1 tablet (50 mg total) by mouth every 8 (eight) hours as needed for up to 5 days.   Physical Exam:   VS:  BP 134/78   Pulse 72   Ht 5\' 8"  (1.727 m)   Wt 191 lb 3.2 oz (86.7 kg)   SpO2 100%   BMI 29.07 kg/m    Wt Readings from Last 3 Encounters:  06/14/23 191 lb 3.2 oz (86.7 kg)  05/12/23 188 lb 7.9 oz (85.5 kg)  05/04/23 188 lb 8 oz (85.5 kg)   GEN: Well nourished, well developed in no acute distress NECK: No JVD; No carotid bruits CARDIAC: RRR, no murmurs, rubs, gallops RESPIRATORY:  Clear to auscultation without rales, wheezing or rhonchi  ABDOMEN: Soft, non-tender, non-distended EXTREMITIES: mild bilateral lower extremity edema; No acute deformity  Asessement and Plan:.    CAD: S/p DES x 2 overlapping proximal LAD, in 2017 and in 2020.   Patient reports very rare episodes of right sided chest discomfort, he notes he has had this for a long time, relieves quickly with rest. Patient feels he is overall at his baseline and does not wish for further changes. Reviewed ED precautions. Continue amlodipine, Imdur, Crestor and Zetia.  No ASA in the setting of chronic DOAC therapy.  Aortic stenosis: S/p TAVR. Echo in 12/2022 showed EF 55 to 60%, normal LV function, no RWMA, G1 DD, normal RV, degenerative mitral valve, trivial mitral valve regurgitation, no aortic stenosis,  stable aortic valve prosthesis, mean gradient 14 mmHg (slight progression in gradient but is overall stable). Denies any new symptoms of dyspnea, orthopnea or pnd. Reviewed SBE. Will plan for repeat echocardiogram in 12/2023 for monitoring.   Paroxysmal atrial fibrillation: Diltiazem was previously discontinued in setting of sinus pause. Patient unaware of any breakthrough atrial fibrillation. Reviewed loop recorder transmissions from 04/16/2023 and 05/23/2023.  Transmission from 04/30/2023 showed normal device function, no new symptoms, tacky bradycardia episodes.  No new A-fib episodes he did have 1 pause detection consistent with prior notes.  Transmission on 05/23/2023 normal device function, no new symptoms, no new A-fib episodes or pauses. Continue Eliquis 2.5 mg twice daily, patient denies any bleeding problems.  On reduced dose for age and renal function.  Sinus pause/first-degree AV block: Mr. Porcaro was hospitalized in May 2024 in setting of 6.4-second pause noted on outpatient cardiac monitor.  S/p loop recorder.  He had a  pause of 3 seconds noted on ILR in August. Notes intermittent dizziness, denies presyncope or syncope. Reviewed ED precautions. Plan for follow up with Dr. Lalla Brothers in January.   Carotid stenosis: Most recent carotid Dopplers in 03/2023 showed right carotid velocities consistent with a 60 to 79% stenosis, left carotid 1 to 39% stenosis. Followed by Dr. Lenell Antu with vascular surgery. Will have repeat carotid dopplers in 10/2023.   History of CVA: CVA occurred in May 2023 with right hemiparesis, found to have left thalamic infarction and small left cerebral infarct.  Patient was on Eliquis at that time.  On chart review patient presented to the ED on 05/12/2023 with intermittent aphasia, felt to be TIA, patient reported compliance with Eliquis.  Per patient he has seen neurology with no recommended changes. Continue Eliquis, Crestor and Zetia.  Hypertension: Blood pressure well controlled  today at 134/78. Per Dr. Herbie Baltimore to allow permissive hypertension with systolic ranging 161-096 given history of orthostasis and syncope. Continue current antihypertensive regimen.  Hyperlipidemia: Last lipid profile on 01/29/2023 indicated total cholesterol 104, triglycerides 113, HDL 35.7 and LDL 46.  Continue Crestor and Zetia.  Chronic bilateral lower extremity edema: Chronic, stable.  General euvolemic and well compensated on exam.  Continue Lasix.  CKD stage IV: Last creatinine 2.82 on 05/12/2023.  Daytime fatigue/history of snoring: Patient notes daytime fatigue and history of snoring.  Discussed possible sleep study however patient declines at this time.  Disposition: F/u with Dr. Lalla Brothers in January, 2025 and Dr. Herbie Baltimore February, 2025 or sooner if needed.   Signed, Rip Harbour, NP

## 2023-06-14 ENCOUNTER — Encounter: Payer: Self-pay | Admitting: Cardiology

## 2023-06-14 ENCOUNTER — Ambulatory Visit: Payer: Medicare Other | Attending: Cardiology | Admitting: Cardiology

## 2023-06-14 VITALS — BP 134/78 | HR 72 | Ht 68.0 in | Wt 191.2 lb

## 2023-06-14 DIAGNOSIS — Z8673 Personal history of transient ischemic attack (TIA), and cerebral infarction without residual deficits: Secondary | ICD-10-CM | POA: Diagnosis not present

## 2023-06-14 DIAGNOSIS — Z87898 Personal history of other specified conditions: Secondary | ICD-10-CM | POA: Diagnosis not present

## 2023-06-14 DIAGNOSIS — I350 Nonrheumatic aortic (valve) stenosis: Secondary | ICD-10-CM | POA: Diagnosis not present

## 2023-06-14 DIAGNOSIS — R600 Localized edema: Secondary | ICD-10-CM

## 2023-06-14 DIAGNOSIS — N184 Chronic kidney disease, stage 4 (severe): Secondary | ICD-10-CM | POA: Diagnosis not present

## 2023-06-14 DIAGNOSIS — I480 Paroxysmal atrial fibrillation: Secondary | ICD-10-CM | POA: Diagnosis not present

## 2023-06-14 DIAGNOSIS — I10 Essential (primary) hypertension: Secondary | ICD-10-CM | POA: Diagnosis not present

## 2023-06-14 DIAGNOSIS — Z952 Presence of prosthetic heart valve: Secondary | ICD-10-CM

## 2023-06-14 DIAGNOSIS — I455 Other specified heart block: Secondary | ICD-10-CM

## 2023-06-14 DIAGNOSIS — I25118 Atherosclerotic heart disease of native coronary artery with other forms of angina pectoris: Secondary | ICD-10-CM | POA: Diagnosis not present

## 2023-06-14 DIAGNOSIS — E785 Hyperlipidemia, unspecified: Secondary | ICD-10-CM | POA: Diagnosis not present

## 2023-06-14 DIAGNOSIS — I6521 Occlusion and stenosis of right carotid artery: Secondary | ICD-10-CM | POA: Diagnosis not present

## 2023-06-14 NOTE — Patient Instructions (Addendum)
Medication Instructions:  No Changes *If you need a refill on your cardiac medications before your next appointment, please call your pharmacy*   Lab Work: No labs If you have labs (blood work) drawn today and your tests are completely normal, you will receive your results only by: MyChart Message (if you have MyChart) OR A paper copy in the mail If you have any lab test that is abnormal or we need to change your treatment, we will call you to review the results.   Testing/Procedures: Echo in may Your physician has requested that you have an echocardiogram. Echocardiography is a painless test that uses sound waves to create images of your heart. It provides your doctor with information about the size and shape of your heart and how well your heart's chambers and valves are working. This procedure takes approximately one hour. There are no restrictions for this procedure. Please do NOT wear cologne, perfume, aftershave, or lotions (deodorant is allowed). Please arrive 15 minutes prior to your appointment time.  Please note: We ask at that you not bring children with you during ultrasound (echo/ vascular) testing. Due to room size and safety concerns, children are not allowed in the ultrasound rooms during exams. Our front office staff cannot provide observation of children in our lobby area while testing is being conducted. An adult accompanying a patient to their appointment will only be allowed in the ultrasound room at the discretion of the ultrasound technician under special circumstances. We apologize for any inconvenience.    Follow-Up: At [facility], you and your health needs are our priority.  As part of our continuing mission to provide you with exceptional heart care, we have created designated Provider Care Teams.  These Care Teams include your primary Cardiologist (physician) and Advanced Practice Providers (APPs -  Physician Assistants and Nurse Practitioners) who all  work together to provide you with the care you need, when you need it.  We recommend signing up for the patient portal called "MyChart".  Sign up information is provided on this After Visit Summary.  MyChart is used to connect with patients for Virtual Visits (Telemedicine).  Patients are able to view lab/test results, encounter notes, upcoming appointments, etc.  Non-urgent messages can be sent to your provider as well.   To learn more about what you can do with MyChart, go to [email or web address].    Your next appointment:   February 7th at 10:40 am with Bryan Lemma, MD  Needs January appointment made with Dr. Lalla Brothers.

## 2023-06-24 ENCOUNTER — Ambulatory Visit: Payer: Self-pay

## 2023-06-24 NOTE — Patient Outreach (Signed)
  Care Coordination   Follow Up Visit Note   06/24/2023 Name: Nichols Demarest. MRN: [id number] DOB: May 25, 1930  [name] is a 89 y.o. year old male who sees Hunter, Aldine Contes, MD for primary care. I spoke with  [name] by phone today.  What matters to the patients health and wellness today?   Maintain health    Goals Addressed             This Visit's Progress    COMPLETED: A,Fib managment       Care Coordination Interventions: Counseled on increased risk of stroke due to Afib and benefits of anticoagulation for stroke prevention Reviewed importance of adherence to anticoagulant exactly as prescribed  Patient reports doing well. He states "I am still alive" Weight remains stable around 180 lbs.  Reports blood pressure doing good.  Discussed HTN and A.Fib. management.  Patient agreeable to case closure as he is managing well.  No concerns.         SDOH assessments and interventions completed:  Yes     Care Coordination Interventions:  Yes, provided   Follow up plan: No further intervention required.   Encounter Outcome:  Patient Visit Completed   Bary Leriche, RN, MSN [facility]  [facility], [facility] Management Community Coordinator Direct Dial: [phone]  Fax: [phone] Website: [email or web address]

## 2023-06-24 NOTE — Patient Instructions (Signed)
Visit Information  Thank you for taking time to visit with me today. Please don't hesitate to contact me if I can be of assistance to you.   Following are the goals we discussed today:   Goals Addressed             This Visit's Progress    COMPLETED: A,Fib managment       Care Coordination Interventions: Counseled on increased risk of stroke due to Afib and benefits of anticoagulation for stroke prevention Reviewed importance of adherence to anticoagulant exactly as prescribed  Patient reports doing well. He states "I am still alive" Weight remains stable around 180 lbs.  Reports blood pressure doing good.  Discussed HTN and A.Fib. management.  Patient agreeable to case closure as he is managing well.  No concerns.         If you are experiencing a Mental Health or Behavioral Health Crisis or need someone to talk to, please call the Suicide and Crisis Lifeline: 988   Patient verbalizes understanding of instructions and care plan provided today and agrees to view in MyChart. Active MyChart status and patient understanding of how to access instructions and care plan via MyChart confirmed with patient.     The patient has been provided with contact information for the care management team and has been advised to call with any health related questions or concerns.   Bary Leriche, RN, MSN [facility], [facility] Management Community Coordinator Direct Dial: [phone]  Fax: [phone] Website: [email or web address]

## 2023-06-28 ENCOUNTER — Ambulatory Visit (INDEPENDENT_AMBULATORY_CARE_PROVIDER_SITE_OTHER): Payer: Medicare Other

## 2023-06-28 DIAGNOSIS — Z87898 Personal history of other specified conditions: Secondary | ICD-10-CM | POA: Diagnosis not present

## 2023-06-28 LAB — CUP PACEART REMOTE DEVICE CHECK
Date Time Interrogation Session: 20241117233133
Implantable Pulse Generator Implant Date: 20240530

## 2023-07-22 NOTE — Progress Notes (Signed)
Carelink Summary Report / Loop Recorder 

## 2023-07-29 ENCOUNTER — Other Ambulatory Visit: Payer: Self-pay | Admitting: Physical Medicine & Rehabilitation

## 2023-07-29 ENCOUNTER — Other Ambulatory Visit: Payer: Self-pay | Admitting: Family Medicine

## 2023-07-30 ENCOUNTER — Other Ambulatory Visit: Payer: Self-pay | Admitting: Family Medicine

## 2023-08-02 ENCOUNTER — Ambulatory Visit (INDEPENDENT_AMBULATORY_CARE_PROVIDER_SITE_OTHER): Payer: Medicare Other

## 2023-08-02 DIAGNOSIS — Z87898 Personal history of other specified conditions: Secondary | ICD-10-CM

## 2023-08-02 LAB — CUP PACEART REMOTE DEVICE CHECK
Date Time Interrogation Session: 20241222232450
Implantable Pulse Generator Implant Date: 20240530

## 2023-08-02 NOTE — Telephone Encounter (Signed)
See pharmacy note

## 2023-08-25 ENCOUNTER — Other Ambulatory Visit: Payer: Self-pay | Admitting: Family Medicine

## 2023-08-25 DIAGNOSIS — I480 Paroxysmal atrial fibrillation: Secondary | ICD-10-CM

## 2023-08-25 DIAGNOSIS — Z7901 Long term (current) use of anticoagulants: Secondary | ICD-10-CM

## 2023-09-03 ENCOUNTER — Ambulatory Visit: Payer: Medicare Other | Admitting: Family Medicine

## 2023-09-06 ENCOUNTER — Ambulatory Visit (INDEPENDENT_AMBULATORY_CARE_PROVIDER_SITE_OTHER): Payer: BLUE CROSS/BLUE SHIELD

## 2023-09-06 DIAGNOSIS — Z950 Presence of cardiac pacemaker: Secondary | ICD-10-CM | POA: Diagnosis not present

## 2023-09-06 DIAGNOSIS — Z87898 Personal history of other specified conditions: Secondary | ICD-10-CM

## 2023-09-06 LAB — CUP PACEART REMOTE DEVICE CHECK
Date Time Interrogation Session: 20250126232445
Implantable Pulse Generator Implant Date: 20240530

## 2023-09-10 NOTE — Progress Notes (Signed)
 Carelink Summary Report / Loop Recorder

## 2023-09-17 ENCOUNTER — Ambulatory Visit: Payer: Medicare Other | Attending: Cardiology | Admitting: Cardiology

## 2023-09-20 ENCOUNTER — Ambulatory Visit: Payer: Medicare Other | Admitting: Family Medicine

## 2023-09-21 ENCOUNTER — Other Ambulatory Visit: Payer: Self-pay | Admitting: Family Medicine

## 2023-09-24 ENCOUNTER — Encounter: Payer: Self-pay | Admitting: Family Medicine

## 2023-09-24 ENCOUNTER — Ambulatory Visit: Payer: Medicare Other | Admitting: Family Medicine

## 2023-09-24 VITALS — BP 184/96 | HR 81 | Temp 98.1°F | Ht 68.0 in | Wt 203.6 lb

## 2023-09-24 DIAGNOSIS — Z131 Encounter for screening for diabetes mellitus: Secondary | ICD-10-CM

## 2023-09-24 DIAGNOSIS — I480 Paroxysmal atrial fibrillation: Secondary | ICD-10-CM

## 2023-09-24 DIAGNOSIS — I10 Essential (primary) hypertension: Secondary | ICD-10-CM

## 2023-09-24 DIAGNOSIS — L03114 Cellulitis of left upper limb: Secondary | ICD-10-CM | POA: Diagnosis not present

## 2023-09-24 DIAGNOSIS — R739 Hyperglycemia, unspecified: Secondary | ICD-10-CM | POA: Diagnosis not present

## 2023-09-24 MED ORDER — CEPHALEXIN 500 MG PO CAPS: 500.0000 mg | ORAL_CAPSULE | Freq: Three times a day (TID) | ORAL | 0 refills | Status: AC

## 2023-09-24 MED ORDER — FUROSEMIDE 40 MG PO TABS: 40.0000 mg | ORAL_TABLET | Freq: Two times a day (BID) | ORAL | 3 refills | Status: DC | PRN

## 2023-09-24 NOTE — Progress Notes (Signed)
Phone [phone] In person visit   Subjective:   Mark Romer. is a 89 y.o. year old very pleasant male patient who presents for/with See problem oriented charting Chief Complaint  Patient presents with   Hypertension   Past Medical History-  Patient Active Problem List   Diagnosis Date Noted   Right hemiparesis (HCC) 03/05/2022    Priority: High   S/P TAVR (transcatheter aortic valve replacement) 12/12/2019    Priority: High   CAD S/P DES PCI to proximal LAD 03/22/2015    Priority: High   PAF (paroxysmal atrial fibrillation) (HCC) 08/20/2014    Priority: High    Class: Diagnosis of   Chronic kidney disease (CKD), stage IV (severe) (HCC) 08/20/2014    Priority: High   Personal history of prostate cancer 10/22/2008    Priority: High   Hyperglycemia 09/27/2017    Priority: Medium    B12 deficiency 01/06/2017    Priority: Medium    BPH associated with nocturia 06/15/2016    Priority: Medium    Aortic stenosis 03/14/2015    Priority: Medium    Hereditary and idiopathic peripheral neuropathy 01/12/2014    Priority: Medium    H/O syncope 09/03/2013    Priority: Medium    Right-sided carotid artery disease (HCC) 03/02/2013    Priority: Medium    Hyperlipidemia with target LDL less than 70 03/02/2013    Priority: Medium    Migraine without aura 10/26/2012    Priority: Medium    Macrocytic anemia 10/23/2008    Priority: Medium    Essential hypertension 10/22/2008    Priority: Medium    Excessive daytime sleepiness 10/22/2008    Priority: Medium    Thrombocytopenia, unspecified (HCC) 12/11/2021    Priority: Low   Syncope 11/27/2019    Priority: Low   Fatigue 11/08/2017    Priority: Low   Chronic diarrhea 01/06/2017    Priority: Low   Perianal dermatitis 06/19/2015    Priority: Low   Rectal bleeding 04/25/2015    Priority: Low   Dyspnea 08/31/2014    Priority: Low   Long term current use of anticoagulant therapy: CHA2DS2-VASc 7 -> Eliquis 2.5 mg twice  daily 08/27/2014    Priority: Low   GLAUCOMA 10/23/2008    Priority: Low   Hemorrhoids 10/22/2008    Priority: Low   Arthropathy 10/22/2008    Priority: Low   History of colonic polyps 10/22/2008    Priority: Low   Sinus pause 01/05/2023   Carotid stenosis, right 12/25/2022   Demand ischemia (HCC) 12/24/2022   Genetic testing 12/22/2022   Gait disturbance, post-stroke 07/28/2022   Situational depression 03/05/2022   Loud snoring 02/04/2022   Left thalamic infarction (HCC) 12/29/2021   Hematuria 12/11/2020   Bilateral lower extremity edema 04/15/2017   Crescendo angina (HCC) 06/01/2016    Medications- reviewed and updated Current Outpatient Medications  Medication Sig Dispense Refill   acetaminophen (TYLENOL) 325 MG tablet Take 2 tablets (650 mg total) by mouth every 4 (four) hours as needed for mild pain (or temp > 37.5 C (99.5 F)).     amLODipine (NORVASC) 5 MG tablet Take 0.5 tablets (2.5 mg total) by mouth daily. 30 tablet 11   amoxicillin (AMOXIL) 500 MG capsule Take 500 mg by mouth as needed (Use for pre dental procedure).     cephALEXin (KEFLEX) 500 MG capsule Take 1 capsule (500 mg total) by mouth every 8 (eight) hours for 7 days. 21 capsule 0   diclofenac Sodium (VOLTAREN)  1 % GEL Apply 2 g topically 4 (four) times daily. (Patient taking differently: Apply 2 g topically as needed. As needed) 2 g 0   ELIQUIS 2.5 MG TABS tablet Take 1 tablet (2.5 mg total) by mouth 2 (two) times daily. 180 tablet 2   ezetimibe (ZETIA) 10 MG tablet Take 1 tablet (10 mg total) by mouth daily. 90 tablet 3   ferrous sulfate 325 (65 FE) MG tablet Take 325 mg by mouth daily with breakfast.     FLUoxetine (PROZAC) 10 MG capsule TAKE ONE CAPSULE BY MOUTH DAILY 30 capsule 2   furosemide (LASIX) 40 MG tablet Take 1 tablet (40 mg total) by mouth daily. 90 tablet 3   furosemide (LASIX) 40 MG tablet Take 1 tablet (40 mg total) by mouth 2 (two) times daily as needed for fluid or edema (take twice daily  only when instructed by physician). 30 tablet 3   isosorbide mononitrate (IMDUR) 60 MG 24 hr tablet Take 1.5 tablets (90 mg total) by mouth daily. 135 tablet 3   Multiple Vitamins-Minerals (CENTRUM SILVER ADULT 50+) TABS Take 1 tablet by mouth daily.     nitroGLYCERIN (NITROSTAT) 0.4 MG SL tablet ONE TABLET UNDER TONGUE WHEN NEEDED FOR CHEST PAIN. MAY REPEAT IN 5 MINUTES. 25 tablet 0   rosuvastatin (CRESTOR) 10 MG tablet TAKE (1) TABLET DAILY AT BEDTIME. 90 tablet 3   topiramate (TOPAMAX) 100 MG tablet Take 1 tablet (100 mg total) by mouth 2 (two) times daily. 60 tablet 3   traMADol (ULTRAM) 50 MG tablet Take 1 tablet (50 mg total) by mouth every 8 (eight) hours as needed for up to 5 days. 15 tablet 0   No current facility-administered medications for this visit.     Objective:  BP (!) 184/96   Pulse 81   Temp 98.1 F (36.7 C)   Ht 5\' 8"  (1.727 m)   Wt 203 lb 9.6 oz (92.4 kg)   SpO2 100%   BMI 30.96 kg/m  Gen: Appears fatigued CV: RRR no murmurs rubs or gallops Lungs: CTAB no crackles, wheeze, rhonchi Ext: 2+ edema Skin: warm, dry     Assessment and Plan      #Left hand swelling/warmth/redness S: fall last Friday- putting on sweater - had perhaps just stood up and reports fainting (sounds orthostatic) and also fell February 5th (twice this year- also in January- possible rib fracture per daughter- bent over and stood up and had orthostatic hypotension likely).  - cut the hand with recent fall in 3 areas- has gotten more red, puffy, tender and warm since that time A/P: this appears to be cellulitis- treat with keflex 500 mg q8 hours- down from 500 mg q6 hours due to renal function. If hand not improving see [place] back or sooner if worsens - also today with laceration  # Atrial fibrillation S: Rate controlled with no medicine -prior diltiazem 240mg  XR was stopped due to sinus pause issues Anticoagulated with eliquis 2.5 mg BID A/P: Rate control without medicine.  Appropriately  anticoagulated-continue current medication  #hypertension S: medication: amlodipine 5 mg, lasix 40 mg daily (but was out for 10 days), imdur 60 mg Home readings #s: Did not report recent readings -no chest pain or shortness of breath. No headache(s) or blurry vision.  -up 15 lbs from last visit. Upcoming echocardiogram in may   BP Readings from Last 3 Encounters:  09/24/23 (!) 184/96  06/14/23 134/78  05/12/23 (!) 190/98    A/P: Blood pressure severely  elevated but patient is asymptomatic-reports being off of Lasix for 10 days and he does appear fluid overloaded with weight up 15 pounds from last visit and I wonder if this is contributing-he is going to take Lasix twice a day for the next 3 days and then continue daily Lasix-new prescription written so he can restart the Lasix-he is going to update me next week with blood pressures-we may need to be more aggressive with management if this does not help.  1 month recheck in person regardless  Recommended follow up: Return in about 1 month (around 10/22/2023) for followup or sooner if needed.Schedule b4 you leave. Future Appointments  Date Time Provider Department Center  10/11/2023  7:05 AM [facility] DEVICE REMOTES [facility]  10/28/2023 10:20 AM Shelva Majestic, MD [facility]  11/15/2023  7:10 AM [facility] DEVICE REMOTES [facility]  12/02/2023 11:20 AM [facility] ANNUAL WELLNESS VISIT 1 [facility]  12/13/2023 11:15 AM [facility] ECHO 3 [facility]  12/20/2023  7:05 AM [facility] DEVICE REMOTES [facility]  01/24/2024  7:30 AM [facility] DEVICE REMOTES [facility]  02/28/2024  7:15 AM [facility] DEVICE REMOTES [facility]  04/03/2024  7:05 AM [facility] DEVICE REMOTES [facility]    Lab/Order associations:   ICD-10-CM   1. Cellulitis of left hand  L03.114     2. Essential hypertension  I10 CBC with Differential/Platelet    Comprehensive  metabolic panel    CBC with Differential/Platelet    Comprehensive metabolic panel    CANCELED: Comprehensive metabolic panel    CANCELED: CBC with Differential/Platelet    3. Hyperglycemia  R73.9 Hemoglobin A1c    Hemoglobin A1c    CANCELED: Hemoglobin A1c    4. Screening for diabetes mellitus  Z13.1 Hemoglobin A1c    Hemoglobin A1c    CANCELED: Hemoglobin A1c    5. PAF (paroxysmal atrial fibrillation) (HCC)  I48.0       Meds ordered this encounter  Medications   cephALEXin (KEFLEX) 500 MG capsule    Sig: Take 1 capsule (500 mg total) by mouth every 8 (eight) hours for 7 days.    Dispense:  21 capsule    Refill:  0   furosemide (LASIX) 40 MG tablet    Sig: Take 1 tablet (40 mg total) by mouth 2 (two) times daily as needed for fluid or edema (take twice daily only when instructed by physician).    Dispense:  30 tablet    Refill:  3    Pharmacy please fill this today- he is fluid overloaded and I'm using short term increase in lasix    Return precautions advised.  Tana Conch, MD

## 2023-09-24 NOTE — Patient Instructions (Addendum)
this appears to be cellulitis- treat with keflex 500 mg q8 hours- down from 500 mg q6 hours due to renal function. If hand not improving see [place] back or sooner if worsens  Blood pressure very high today- if you develop chest pain or shortness of breath seek care immediately - otherwise lets continue amlodipne 5 mg, imdur 60 mg and do the lasix/furosemide twice a day seprated by 6 hours  for the next 3 days- update me with blood pressure next week  Schedule follow up with Dr. Everlena Cooper if headache(s) don't improve with getting blood pressure back down and fluid off  Please stop by lab before you go If you have mychart- we will send your results within 3 business days of [place] receiving them.  If you do not have mychart- we will call you about results within 5 business days of [place] receiving them.  *please also note that you will see labs on mychart as soon as they post. I will later go in and write notes on them- will say "notes from Dr. Durene Cal" g  Recommended follow up: Return in about 1 month (around 10/22/2023) for followup or sooner if needed.Schedule b4 you leave.

## 2023-09-25 LAB — CBC WITH DIFFERENTIAL/PLATELET
Absolute Lymphocytes: 1265 {cells}/uL (ref 850–3900)
Absolute Monocytes: 1231 {cells}/uL — ABNORMAL HIGH (ref 200–950)
Basophils Absolute: 58 {cells}/uL (ref 0–200)
Basophils Relative: 0.5 %
Eosinophils Absolute: 403 {cells}/uL (ref 15–500)
Eosinophils Relative: 3.5 %
HCT: 32.3 % — ABNORMAL LOW (ref 38.5–50.0)
Hemoglobin: 10.5 g/dL — ABNORMAL LOW (ref 13.2–17.1)
MCH: 33.2 pg — ABNORMAL HIGH (ref 27.0–33.0)
MCHC: 32.5 g/dL (ref 32.0–36.0)
MCV: 102.2 fL — ABNORMAL HIGH (ref 80.0–100.0)
MPV: 10.7 fL (ref 7.5–12.5)
Monocytes Relative: 10.7 %
Neutro Abs: 8545 {cells}/uL — ABNORMAL HIGH (ref 1500–7800)
Neutrophils Relative %: 74.3 %
Platelets: 131 10*3/uL — ABNORMAL LOW (ref 140–400)
RBC: 3.16 10*6/uL — ABNORMAL LOW (ref 4.20–5.80)
RDW: 11.8 % (ref 11.0–15.0)
Total Lymphocyte: 11 %
WBC: 11.5 10*3/uL — ABNORMAL HIGH (ref 3.8–10.8)

## 2023-09-25 LAB — COMPREHENSIVE METABOLIC PANEL
AG Ratio: 1.3 (calc) (ref 1.0–2.5)
ALT: 8 U/L — ABNORMAL LOW (ref 9–46)
AST: 19 U/L (ref 10–35)
Albumin: 3.4 g/dL — ABNORMAL LOW (ref 3.6–5.1)
Alkaline phosphatase (APISO): 94 U/L (ref 35–144)
BUN/Creatinine Ratio: 14 (calc) (ref 6–22)
BUN: 34 mg/dL — ABNORMAL HIGH (ref 7–25)
CO2: 19 mmol/L — ABNORMAL LOW (ref 20–32)
Calcium: 8 mg/dL — ABNORMAL LOW (ref 8.6–10.3)
Chloride: 113 mmol/L — ABNORMAL HIGH (ref 98–110)
Creat: 2.38 mg/dL — ABNORMAL HIGH (ref 0.70–1.22)
Globulin: 2.7 g/dL (ref 1.9–3.7)
Glucose, Bld: 94 mg/dL (ref 65–99)
Potassium: 4.7 mmol/L (ref 3.5–5.3)
Sodium: 141 mmol/L (ref 135–146)
Total Bilirubin: 0.3 mg/dL (ref 0.2–1.2)
Total Protein: 6.1 g/dL (ref 6.1–8.1)

## 2023-09-25 LAB — HEMOGLOBIN A1C
Hgb A1c MFr Bld: 5.6 %{Hb} (ref <5.7)
Mean Plasma Glucose: 114 mg/dL
eAG (mmol/L): 6.3 mmol/L

## 2023-09-27 ENCOUNTER — Encounter: Payer: Self-pay | Admitting: Family Medicine

## 2023-10-01 ENCOUNTER — Other Ambulatory Visit: Payer: Self-pay | Admitting: Family Medicine

## 2023-10-07 ENCOUNTER — Other Ambulatory Visit: Payer: Self-pay | Admitting: Cardiology

## 2023-10-11 ENCOUNTER — Ambulatory Visit (INDEPENDENT_AMBULATORY_CARE_PROVIDER_SITE_OTHER): Payer: BLUE CROSS/BLUE SHIELD

## 2023-10-11 DIAGNOSIS — Z87898 Personal history of other specified conditions: Secondary | ICD-10-CM | POA: Diagnosis not present

## 2023-10-11 LAB — CUP PACEART REMOTE DEVICE CHECK
Date Time Interrogation Session: 20250302231737
Implantable Pulse Generator Implant Date: 20240530

## 2023-10-15 NOTE — Progress Notes (Signed)
 Carelink Summary Report / Loop Recorder

## 2023-10-25 ENCOUNTER — Other Ambulatory Visit: Payer: Self-pay | Admitting: Family Medicine

## 2023-10-28 ENCOUNTER — Ambulatory Visit (INDEPENDENT_AMBULATORY_CARE_PROVIDER_SITE_OTHER): Payer: Medicare Other | Admitting: Family Medicine

## 2023-10-28 ENCOUNTER — Encounter: Payer: Self-pay | Admitting: Family Medicine

## 2023-10-28 VITALS — BP 138/62 | HR 76 | Temp 97.6°F | Ht 68.0 in | Wt 195.0 lb

## 2023-10-28 DIAGNOSIS — E538 Deficiency of other specified B group vitamins: Secondary | ICD-10-CM

## 2023-10-28 DIAGNOSIS — N184 Chronic kidney disease, stage 4 (severe): Secondary | ICD-10-CM

## 2023-10-28 DIAGNOSIS — I10 Essential (primary) hypertension: Secondary | ICD-10-CM

## 2023-10-28 MED ORDER — CYANOCOBALAMIN 1000 MCG/ML IJ SOLN
1000.0000 ug | Freq: Once | INTRAMUSCULAR | Status: AC
  Administered 2023-10-28: 1000 ug via INTRAMUSCULAR

## 2023-10-28 NOTE — Progress Notes (Signed)
 Phone [phone] In person visit   Subjective:   Mark Jackson. is a 89 y.o. year old very pleasant male patient who presents for/with See problem oriented charting Chief Complaint  Patient presents with   Cellulitis    Pt is here for 1 month f/u on cellulitis    Past Medical History-  Patient Active Problem List   Diagnosis Date Noted   Right hemiparesis (HCC) 03/05/2022    Priority: High   S/P TAVR (transcatheter aortic valve replacement) 12/12/2019    Priority: High   CAD S/P DES PCI to proximal LAD 03/22/2015    Priority: High   PAF (paroxysmal atrial fibrillation) (HCC) 08/20/2014    Priority: High    Class: Diagnosis of   Chronic kidney disease (CKD), stage IV (severe) (HCC) 08/20/2014    Priority: High   Personal history of prostate cancer 10/22/2008    Priority: High   Hyperglycemia 09/27/2017    Priority: Medium    B12 deficiency 01/06/2017    Priority: Medium    BPH associated with nocturia 06/15/2016    Priority: Medium    Aortic stenosis 03/14/2015    Priority: Medium    Hereditary and idiopathic peripheral neuropathy 01/12/2014    Priority: Medium    H/O syncope 09/03/2013    Priority: Medium    Right-sided carotid artery disease (HCC) 03/02/2013    Priority: Medium    Hyperlipidemia with target LDL less than 70 03/02/2013    Priority: Medium    Migraine without aura 10/26/2012    Priority: Medium    Macrocytic anemia 10/23/2008    Priority: Medium    Essential hypertension 10/22/2008    Priority: Medium    Excessive daytime sleepiness 10/22/2008    Priority: Medium    Thrombocytopenia, unspecified (HCC) 12/11/2021    Priority: Low   Syncope 11/27/2019    Priority: Low   Fatigue 11/08/2017    Priority: Low   Chronic diarrhea 01/06/2017    Priority: Low   Perianal dermatitis 06/19/2015    Priority: Low   Rectal bleeding 04/25/2015    Priority: Low   Dyspnea 08/31/2014    Priority: Low   Long term current use of anticoagulant  therapy: CHA2DS2-VASc 7 -> Eliquis 2.5 mg twice daily 08/27/2014    Priority: Low   GLAUCOMA 10/23/2008    Priority: Low   Hemorrhoids 10/22/2008    Priority: Low   Arthropathy 10/22/2008    Priority: Low   History of colonic polyps 10/22/2008    Priority: Low   Sinus pause 01/05/2023   Carotid stenosis, right 12/25/2022   Demand ischemia (HCC) 12/24/2022   Genetic testing 12/22/2022   Gait disturbance, post-stroke 07/28/2022   Situational depression 03/05/2022   Loud snoring 02/04/2022   Left thalamic infarction (HCC) 12/29/2021   Hematuria 12/11/2020   Bilateral lower extremity edema 04/15/2017   Crescendo angina (HCC) 06/01/2016    Medications- reviewed and updated Current Outpatient Medications  Medication Sig Dispense Refill   acetaminophen (TYLENOL) 325 MG tablet Take 2 tablets (650 mg total) by mouth every 4 (four) hours as needed for mild pain (or temp > 37.5 C (99.5 F)).     amLODipine (NORVASC) 5 MG tablet Take 0.5 tablets (2.5 mg total) by mouth daily. 30 tablet 11   diclofenac Sodium (VOLTAREN) 1 % GEL Apply 2 g topically 4 (four) times daily. (Patient taking differently: Apply 2 g topically as needed. As needed) 2 g 0   ELIQUIS 2.5 MG TABS tablet Take  1 tablet (2.5 mg total) by mouth 2 (two) times daily. 180 tablet 2   ezetimibe (ZETIA) 10 MG tablet Take 1 tablet (10 mg total) by mouth daily. 90 tablet 3   ferrous sulfate 325 (65 FE) MG tablet Take 325 mg by mouth daily with breakfast.     FLUoxetine (PROZAC) 10 MG capsule TAKE ONE CAPSULE BY MOUTH DAILY 30 capsule 2   furosemide (LASIX) 40 MG tablet Take 1 tablet (40 mg total) by mouth 2 (two) times daily as needed for fluid or edema (take twice daily only when instructed by physician). 30 tablet 3   isosorbide mononitrate (IMDUR) 60 MG 24 hr tablet Take 1.5 tablets (90 mg total) by mouth daily. 135 tablet 3   Multiple Vitamins-Minerals (CENTRUM SILVER ADULT 50+) TABS Take 1 tablet by mouth daily.     nitroGLYCERIN  (NITROSTAT) 0.4 MG SL tablet take ONE TABLET UNDER TONGUE WHEN NEEDED FOR CHEST PAIN. MAY REPEAT IN 5 MINUTES. 25 tablet 9   rosuvastatin (CRESTOR) 10 MG tablet TAKE ONE TABLET BY MOUTH AT BEDTIME 90 tablet 3   topiramate (TOPAMAX) 100 MG tablet Take 1 tablet (100 mg total) by mouth 2 (two) times daily. 60 tablet 3   amoxicillin (AMOXIL) 500 MG capsule Take 500 mg by mouth as needed (Use for pre dental procedure). (Patient not taking: Reported on 10/28/2023)     No current facility-administered medications for this visit.     Objective:  BP 138/62   Pulse 76   Temp 97.6 F (36.4 C)   Ht 5\' 8"  (1.727 m)   Wt 195 lb (88.5 kg)   SpO2 97%   BMI 29.65 kg/m  Gen: NAD, resting comfortably CV: RRR no murmurs rubs or gallops Lungs: CTAB no crackles, wheeze, rhonchi Ext: 2+ edema stable despite weight loss Skin: warm, dry, hand has healed well     Assessment and Plan   # Left hand swelling/warmth/redness after 3 weeks prior and last visit about a month ago-will concern about cellulitis and treated with Keflex 500 mg every 8 hours down from every 6 hours due to renal function -Laceration is healed and no recurrence of cellulitis-continue monitor    #hypertension #CKD IV- sees Dr. Albesa Seen - no dialysis planned S: medication: amlodipine 5 mg, lasix 40 mg, imdur 60 mg but last visit he had  been out of Lasix for 10 days at last visit and weight had trended up 15 pounds-he is back down 8 pounds and blood pressure drastically improved after taking Lasix twice a day for 3 days and then going back to daily use.  Blood pressure had been significantly elevated last visit -Creatinine at last visit improved to 2.4 from 3.2 previously and 2.8 the visit before that BP Readings from Last 3 Encounters:  10/28/23 138/62  09/24/23 (!) 184/96  06/14/23 134/78     Wt Readings from Last 3 Encounters:  10/28/23 195 lb (88.5 kg)  09/24/23 203 lb 9.6 oz (92.4 kg)  06/14/23 191 lb 3.2 oz (86.7 kg)     A/P: Blood pressure much better controlled-continue current medicine CKD stage IV.  Improved on last check but had missed some doses of Lasix-he will continue follow-up with chronic kidney but we also will recheck renal function at next visit  # B12 deficiency S: Current treatment/medication (oral vs. IM):  injections eery 5 weeks at home in the past-sounds like he has been off for some time and he request B12 injection today A/P: B12 injection given  today-suspect levels improving as result-agrees to recheck next visit.  Continue monthly injections either here or at home  Recommended follow up: Return in about 6 months (around 04/29/2024) for followup or sooner if needed.Schedule b4 you leave. Future Appointments  Date Time Provider Department Center  11/15/2023  7:10 AM [facility] DEVICE REMOTES [facility]  11/30/2023  2:00 PM [facility] CLINICAL SUPPORT [facility]  12/02/2023 11:20 AM [facility] ANNUAL WELLNESS VISIT 1 [facility]  12/13/2023 11:15 AM [facility] ECHO 3 [facility]  12/20/2023  7:05 AM [facility] DEVICE REMOTES [facility]  01/24/2024  7:30 AM [facility] DEVICE REMOTES [facility]  02/28/2024  7:15 AM [facility] DEVICE REMOTES [facility]  04/03/2024  7:05 AM [facility] DEVICE REMOTES [facility]  05/02/2024 11:00 AM Toddy Boyd, Aldine Contes, MD [facility]    Lab/Order associations:   ICD-10-CM   1. Essential hypertension  I10     2. Chronic kidney disease (CKD), stage IV (severe) (HCC)  N18.4     3. B12 deficiency  E53.8       No orders of the defined types were placed in this encounter.   Return precautions advised.  Tana Conch, MD

## 2023-10-28 NOTE — Addendum Note (Signed)
 Addended by: Gwenette Greet on: 10/28/2023 11:22 AM   Modules accepted: Orders

## 2023-10-28 NOTE — Patient Instructions (Addendum)
 B12 before you go- labs next visit  If weight or blood pressure trends back up let me know but looking much better today and glad the cellulitis healed  Recommended follow up: Return in about 6 months (around 04/29/2024) for followup or sooner if needed.Schedule b4 you leave.

## 2023-11-12 NOTE — Progress Notes (Signed)
 Carelink Summary Report / Loop Recorder

## 2023-11-15 ENCOUNTER — Ambulatory Visit (INDEPENDENT_AMBULATORY_CARE_PROVIDER_SITE_OTHER): Payer: BLUE CROSS/BLUE SHIELD

## 2023-11-15 DIAGNOSIS — I480 Paroxysmal atrial fibrillation: Secondary | ICD-10-CM | POA: Diagnosis not present

## 2023-11-15 LAB — CUP PACEART REMOTE DEVICE CHECK
Date Time Interrogation Session: 20250406231748
Implantable Pulse Generator Implant Date: 20240530

## 2023-11-24 ENCOUNTER — Other Ambulatory Visit: Payer: Self-pay | Admitting: Family Medicine

## 2023-11-24 ENCOUNTER — Other Ambulatory Visit: Payer: Self-pay | Admitting: Family

## 2023-11-30 ENCOUNTER — Ambulatory Visit

## 2023-11-30 DIAGNOSIS — E538 Deficiency of other specified B group vitamins: Secondary | ICD-10-CM | POA: Diagnosis not present

## 2023-11-30 MED ORDER — CYANOCOBALAMIN 1000 MCG/ML IJ SOLN
1000.0000 ug | Freq: Once | INTRAMUSCULAR | Status: AC
  Administered 2023-11-30: 1000 ug via INTRAMUSCULAR

## 2023-11-30 NOTE — Progress Notes (Signed)
 Patient is in office today for a nurse visit for B12 Injection. Patient Injection was given in the  Left deltoid. Patient tolerated injection well.

## 2023-12-02 ENCOUNTER — Ambulatory Visit: Payer: Medicare Other

## 2023-12-02 VITALS — Wt 183.0 lb

## 2023-12-02 DIAGNOSIS — Z0000 Encounter for general adult medical examination without abnormal findings: Secondary | ICD-10-CM | POA: Diagnosis not present

## 2023-12-02 NOTE — Patient Instructions (Signed)
 Mark Jackson , Thank you for taking time to come for your Medicare Wellness Visit. I appreciate your ongoing commitment to your health goals. Please review the following plan we discussed and let me know if I can assist you in the future.   Referrals/Orders/Follow-Ups/Clinician Recommendations: Each day, aim for 6 glasses of water, plenty of protein in your diet and try to get up and walk/ stretch every hour for 5-10 minutes at a time.    This is a list of the screening recommended for you and due dates:  Health Maintenance  Topic Date Due   COVID-19 Vaccine (5 - 2024-25 season) 04/11/2023   Flu Shot  03/10/2024   Medicare Annual Wellness Visit  12/01/2024   DTaP/Tdap/Td vaccine (2 - Td or Tdap) 03/01/2029   Pneumonia Vaccine  Completed   Zoster (Shingles) Vaccine  Completed   HPV Vaccine  Aged Out   Meningitis B Vaccine  Aged Out    Advanced directives: (Copy Requested) Please bring a copy of your health care power of attorney and living will to the office to be added to your chart at your convenience. You can mail to [facility] [address] [place], [place] [address] or email to [email or web address]  Next Medicare Annual Wellness Visit scheduled for next year: Yes
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
## 2023-12-13 ENCOUNTER — Other Ambulatory Visit (HOSPITAL_COMMUNITY): Payer: Medicare Other

## 2023-12-22 ENCOUNTER — Telehealth: Payer: Self-pay | Admitting: Cardiology

## 2023-12-22 NOTE — Telephone Encounter (Signed)
 I spoke with the pt daughter and she is going to pick up a bedside monitor.

## 2023-12-22 NOTE — Telephone Encounter (Signed)
 Pt daughter requesting cb due to [facility] not receiving transmissions

## 2023-12-24 ENCOUNTER — Other Ambulatory Visit: Payer: Self-pay | Admitting: Family Medicine

## 2023-12-29 ENCOUNTER — Ambulatory Visit (INDEPENDENT_AMBULATORY_CARE_PROVIDER_SITE_OTHER)

## 2023-12-29 DIAGNOSIS — E538 Deficiency of other specified B group vitamins: Secondary | ICD-10-CM | POA: Diagnosis not present

## 2023-12-29 MED ORDER — CYANOCOBALAMIN 1000 MCG/ML IJ SOLN
1000.0000 ug | Freq: Once | INTRAMUSCULAR | Status: AC
  Administered 2023-12-29: 1000 ug via INTRAMUSCULAR

## 2023-12-29 NOTE — Progress Notes (Signed)
 Patient is in office today for a nurse visit for B12 Injection, per PCP's order. Patient Injection was given in the  Right deltoid. Patient tolerated injection well. Pt stated during conversation he has recently taken a fall but no injury. Advised pt I would inform PCP so he is aware.

## 2023-12-30 NOTE — Progress Notes (Signed)
 Carelink Summary Report / Loop Recorder

## 2024-01-05 ENCOUNTER — Ambulatory Visit (HOSPITAL_COMMUNITY)
Admission: RE | Admit: 2024-01-05 | Discharge: 2024-01-05 | Disposition: A | Discharge status: Home / Self Care | Source: Ambulatory Visit | Attending: Cardiology | Admitting: Cardiology

## 2024-01-05 ENCOUNTER — Ambulatory Visit: Payer: Self-pay | Admitting: Cardiology

## 2024-01-05 DIAGNOSIS — Z952 Presence of prosthetic heart valve: Secondary | ICD-10-CM | POA: Diagnosis not present

## 2024-01-05 DIAGNOSIS — I25118 Atherosclerotic heart disease of native coronary artery with other forms of angina pectoris: Secondary | ICD-10-CM | POA: Diagnosis not present

## 2024-01-05 DIAGNOSIS — I350 Nonrheumatic aortic (valve) stenosis: Secondary | ICD-10-CM | POA: Diagnosis not present

## 2024-01-05 LAB — ECHOCARDIOGRAM COMPLETE
AR max vel: 0.97 cm2
AV Area VTI: 0.96 cm2
AV Area mean vel: 1.04 cm2
AV Mean grad: 15 mmHg
AV Peak grad: 25.8 mmHg
Ao pk vel: 2.54 m/s
Area-P 1/2: 2.42 cm2
Est EF: 55
S' Lateral: 3.7 cm

## 2024-01-07 NOTE — Telephone Encounter (Signed)
 Called patient advised of below they verbalized understanding Patient is due for f/u

## 2024-01-07 NOTE — Telephone Encounter (Signed)
-----   Message from Katlyn D [place] sent at 01/05/2024  9:36 PM EDT ----- Please let Mr. Stockman know that his echocardiogram indicated normal heart squeeze and function, there is mild stiffening and thickening of the left lower chamber of the heart, this is common with a history of hypertension and with aging.  There is mild leaking of the mitral valve, TAVR with trivial leaking, overall stable.  Good results.  Continue current medications.

## 2024-01-14 ENCOUNTER — Emergency Department (HOSPITAL_COMMUNITY)
Admission: EM | Admit: 2024-01-14 | Discharge: 2024-01-15 | Disposition: A | Discharge status: Home / Self Care | Attending: Emergency Medicine | Admitting: Emergency Medicine

## 2024-01-14 ENCOUNTER — Other Ambulatory Visit: Payer: Self-pay

## 2024-01-14 ENCOUNTER — Encounter (HOSPITAL_COMMUNITY): Payer: Self-pay

## 2024-01-14 DIAGNOSIS — I2510 Atherosclerotic heart disease of native coronary artery without angina pectoris: Secondary | ICD-10-CM | POA: Diagnosis not present

## 2024-01-14 DIAGNOSIS — G44309 Post-traumatic headache, unspecified, not intractable: Secondary | ICD-10-CM | POA: Diagnosis not present

## 2024-01-14 DIAGNOSIS — N189 Chronic kidney disease, unspecified: Secondary | ICD-10-CM | POA: Insufficient documentation

## 2024-01-14 DIAGNOSIS — I129 Hypertensive chronic kidney disease with stage 1 through stage 4 chronic kidney disease, or unspecified chronic kidney disease: Secondary | ICD-10-CM | POA: Diagnosis not present

## 2024-01-14 DIAGNOSIS — Z043 Encounter for examination and observation following other accident: Secondary | ICD-10-CM | POA: Diagnosis not present

## 2024-01-14 DIAGNOSIS — S0003XA Contusion of scalp, initial encounter: Secondary | ICD-10-CM | POA: Diagnosis not present

## 2024-01-14 DIAGNOSIS — Y92010 Kitchen of single-family (private) house as the place of occurrence of the external cause: Secondary | ICD-10-CM | POA: Diagnosis not present

## 2024-01-14 DIAGNOSIS — M542 Cervicalgia: Secondary | ICD-10-CM | POA: Diagnosis not present

## 2024-01-14 DIAGNOSIS — Z7901 Long term (current) use of anticoagulants: Secondary | ICD-10-CM | POA: Diagnosis not present

## 2024-01-14 DIAGNOSIS — W19XXXA Unspecified fall, initial encounter: Secondary | ICD-10-CM

## 2024-01-14 DIAGNOSIS — I10 Essential (primary) hypertension: Secondary | ICD-10-CM | POA: Diagnosis not present

## 2024-01-14 DIAGNOSIS — I6782 Cerebral ischemia: Secondary | ICD-10-CM | POA: Diagnosis not present

## 2024-01-14 DIAGNOSIS — W010XXA Fall on same level from slipping, tripping and stumbling without subsequent striking against object, initial encounter: Secondary | ICD-10-CM | POA: Insufficient documentation

## 2024-01-14 DIAGNOSIS — Y92000 Kitchen of unspecified non-institutional (private) residence as  the place of occurrence of the external cause: Secondary | ICD-10-CM | POA: Insufficient documentation

## 2024-01-14 DIAGNOSIS — S0990XA Unspecified injury of head, initial encounter: Secondary | ICD-10-CM | POA: Diagnosis not present

## 2024-01-14 DIAGNOSIS — G930 Cerebral cysts: Secondary | ICD-10-CM | POA: Diagnosis not present

## 2024-01-14 NOTE — ED Provider Notes (Signed)
 [place] [facility] DEPARTMENT AT [facility] Provider Note   CSN: [id number] Arrival date & time: 01/14/24  2242     History  Chief Complaint  Patient presents with   Fall   Trauma    Mark Jackson. is a 89 y.o. male.  Patient is a 89 year old male with past medical history of A-fib on Eliquis , hypertension, CAD, CKD, CVA presenting to the emergency department after a fall.  Patient reports that he was getting food out of his refrigerator from his kitchen when he lost his balance and fell backwards hit the back of his head and his back.  He denies any loss of consciousness.  He denies any headache or any other pain from the fall.  He denies feeling dizzy or having any chest pain or shortness of breath prior to the fall.  The history is provided by the patient and the EMS personnel.  Fall  Trauma Mechanism of injury: Fall       Home Medications Prior to Admission medications   Medication Sig Start Date End Date Taking? Authorizing Provider  acetaminophen  (TYLENOL ) 325 MG tablet Take 2 tablets (650 mg total) by mouth every 4 (four) hours as needed for mild pain (or temp > 37.5 C (99.5 F)). 01/12/22   Angiulli, Everlyn Hockey, PA-C  amLODipine  (NORVASC ) 5 MG tablet Take 0.5 tablets (2.5 mg total) by mouth daily. 01/07/23 01/07/24  Sanjuanita Cruz, NP  amoxicillin  (AMOXIL ) 500 MG capsule Take 500 mg by mouth as needed (Use for pre dental procedure). Patient not taking: Reported on 10/28/2023    [provider]  diclofenac  Sodium (VOLTAREN ) 1 % GEL Apply 2 g topically 4 (four) times daily. Patient taking differently: Apply 2 g topically as needed. As needed 01/12/22   Angiulli, Everlyn Hockey, PA-C  ELIQUIS  2.5 MG TABS tablet Take 1 tablet (2.5 mg total) by mouth 2 (two) times daily. 08/25/23   Almira Jaeger, MD  ezetimibe  (ZETIA ) 10 MG tablet Take 1 tablet (10 mg total) by mouth daily. 04/27/23   Almira Jaeger, MD  ferrous sulfate  325 (65 FE) MG tablet Take 325 mg by  mouth daily with breakfast.    [provider]  FLUoxetine  (PROZAC ) 10 MG capsule TAKE ONE CAPSULE BY MOUTH DAILY 11/24/23   Almira Jaeger, MD  furosemide  (LASIX ) 40 MG tablet Take 1 tablet (40 mg total) by mouth 2 (two) times daily as needed for fluid or edema (take twice daily only when instructed by physician). 12/24/23   Almira Jaeger, MD  isosorbide  mononitrate (IMDUR ) 60 MG 24 hr tablet Take 1.5 tablets (90 mg total) by mouth daily. 03/10/23   Boyce Byes, MD  Multiple Vitamins-Minerals (CENTRUM SILVER ADULT 50+) TABS Take 1 tablet by mouth daily.    [provider]  nitroGLYCERIN  (NITROSTAT ) 0.4 MG SL tablet take ONE TABLET UNDER TONGUE WHEN NEEDED FOR CHEST PAIN. MAY REPEAT IN 5 MINUTES. 10/07/23   West, Katlyn D, NP  rosuvastatin  (CRESTOR ) 10 MG tablet TAKE ONE TABLET BY MOUTH AT BEDTIME 10/04/23   Almira Jaeger, MD  topiramate  (TOPAMAX ) 100 MG tablet Take 1 tablet (100 mg total) by mouth 2 (two) times daily. 11/24/23   Versa Gore, NP      Allergies    Patient has no known allergies.    Review of Systems   Review of Systems  Physical Exam Updated Vital Signs BP (!) 142/92 Comment: Manual  Pulse 87   Temp 98.1  F (36.7 C)   Resp 18   Ht 5\' 8"  (1.727 m)   Wt 83.8 kg   SpO2 97%   BMI 28.09 kg/m  Physical Exam Vitals and nursing note reviewed.  Constitutional:      General: He is not in acute distress.    Appearance: Normal appearance.  HENT:     Head: Normocephalic and atraumatic.     Nose: Nose normal.     Mouth/Throat:     Mouth: Mucous membranes are moist.     Pharynx: Oropharynx is clear.  Eyes:     Extraocular Movements: Extraocular movements intact.     Conjunctiva/sclera: Conjunctivae normal.     Pupils: Pupils are equal, round, and reactive to light.  Neck:     Comments: No midline neck tenderness Cardiovascular:     Rate and Rhythm: Normal rate and regular rhythm.     Heart sounds: Normal heart sounds.  Pulmonary:      Effort: Pulmonary effort is normal.  Abdominal:     General: Abdomen is flat.     Palpations: Abdomen is soft.     Tenderness: There is no abdominal tenderness.  Musculoskeletal:        General: Normal range of motion.     Cervical back: Normal range of motion and neck supple.     Comments: No midline back tenderness No bony tenderness of bilateral upper or lower extremities Pelvis stable, nontender   Skin:    General: Skin is warm and dry.  Neurological:     General: No focal deficit present.     Mental Status: He is alert and oriented to person, place, and time.     Sensory: No sensory deficit.     Motor: No weakness.  Psychiatric:        Mood and Affect: Mood normal.        Behavior: Behavior normal.     ED Results / Procedures / Treatments   Labs (all labs ordered are listed, but only abnormal results are displayed) Labs Reviewed - No data to display  EKG None  Radiology No results found.  Procedures Procedures    Medications Ordered in ED Medications - No data to display  ED Course/ Medical Decision Making/ A&P Clinical Course as of 01/14/24 2340  Fri Jan 14, 2024  2340 Patient signed out to Dr. Carol Chroman pending CT imaging. [VK]    Clinical Course User Index [VK] Kingsley, Markala Sitts K, DO                                 Medical Decision Making This patient presents to the ED with chief complaint(s) of fall on thinners with pertinent past medical history of A fib on Eliquis , CAD, HTN, CKD which further complicates the presenting complaint. The complaint involves an extensive differential diagnosis and also carries with it a high risk of complications and morbidity.    The differential diagnosis includes ICH, mass effect, cervical spine fracture, no other traumatic injury seen on exam, no presyncopal symptoms taking syncopal fall unlikely  Additional history obtained: Additional history obtained from EMS  Records reviewed N/A  ED Course and  Reassessment: Patient was made a prehospital arrival level 2 trauma due to his fall on thinners and is immediately present at bedside on his arrival.  His primary survey was intact.  No traumatic injury seen on secondary survey.  Due to his fall on thinners at  his age, will have CT head and C-spine.  Will be closely reassessed.  Independent labs interpretation:  N/A  Independent visualization of imaging: - Pending   Amount and/or Complexity of Data Reviewed Radiology: ordered.          Final Clinical Impression(s) / ED Diagnoses Final diagnoses:  None    Rx / DC Orders ED Discharge Orders     None         Kingsley, Tesia Lybrand K, DO 01/14/24 2340

## 2024-01-14 NOTE — ED Triage Notes (Signed)
 PT arrived via EMS from home after having a fall on hardwood with a rug. PT is a ground level fall on thinners and hematoma noted on back of head. PT states no LOC and GCS of 15. PT states no pain at this time. VSS

## 2024-01-14 NOTE — Progress Notes (Signed)
 Orthopedic Tech Progress Note Patient Details:  Mark Jackson 11-12-1929 [id number]  Patient ID: Mark Paradise., male   DOB: [birth year], 89 y.o.   MRN: [id number] LV2T FOT not needed.  Mark Jackson 01/14/2024, 11:26 PM

## 2024-01-15 ENCOUNTER — Emergency Department (HOSPITAL_COMMUNITY)

## 2024-01-15 DIAGNOSIS — M542 Cervicalgia: Secondary | ICD-10-CM | POA: Diagnosis not present

## 2024-01-15 DIAGNOSIS — S0003XA Contusion of scalp, initial encounter: Secondary | ICD-10-CM | POA: Diagnosis not present

## 2024-01-15 DIAGNOSIS — I6782 Cerebral ischemia: Secondary | ICD-10-CM | POA: Diagnosis not present

## 2024-01-15 DIAGNOSIS — G44309 Post-traumatic headache, unspecified, not intractable: Secondary | ICD-10-CM | POA: Diagnosis not present

## 2024-01-15 DIAGNOSIS — G930 Cerebral cysts: Secondary | ICD-10-CM | POA: Diagnosis not present

## 2024-01-15 NOTE — ED Provider Notes (Signed)
 Patient signed out to me by Dr. Nora Beal.  Patient suffered a fall at home, does take Eliquis  for history of atrial fibrillation.  Patient without complaints.  Because of his anticoagulation, CT performed.  These have been reviewed and are negative for acute injury.  Patient reevaluated, awake and alert.  He is without complaints.  Will discharge.   Ballard Bongo, MD 01/15/24 [phone]

## 2024-01-20 ENCOUNTER — Ambulatory Visit (INDEPENDENT_AMBULATORY_CARE_PROVIDER_SITE_OTHER): Payer: Self-pay

## 2024-01-20 DIAGNOSIS — R55 Syncope and collapse: Secondary | ICD-10-CM

## 2024-01-25 ENCOUNTER — Ambulatory Visit: Payer: Self-pay | Admitting: Cardiology

## 2024-01-28 ENCOUNTER — Other Ambulatory Visit: Payer: Self-pay | Admitting: Cardiology

## 2024-02-01 ENCOUNTER — Ambulatory Visit

## 2024-02-02 ENCOUNTER — Ambulatory Visit (INDEPENDENT_AMBULATORY_CARE_PROVIDER_SITE_OTHER)

## 2024-02-02 DIAGNOSIS — E538 Deficiency of other specified B group vitamins: Secondary | ICD-10-CM

## 2024-02-02 MED ORDER — CYANOCOBALAMIN 1000 MCG/ML IJ SOLN
1000.0000 ug | Freq: Once | INTRAMUSCULAR | Status: AC
  Administered 2024-02-02: 1000 ug via INTRAMUSCULAR

## 2024-02-02 NOTE — Progress Notes (Signed)
 Patient is in office today for a nurse visit for B12 Injection. Patient Injection was given in the  Right deltoid. Patient tolerated injection well.

## 2024-02-18 ENCOUNTER — Other Ambulatory Visit: Payer: Self-pay

## 2024-02-18 ENCOUNTER — Telehealth: Payer: Self-pay

## 2024-02-18 MED ORDER — AMLODIPINE BESYLATE 5 MG PO TABS: 2.5000 mg | ORAL_TABLET | Freq: Every day | ORAL | 3 refills | Status: AC

## 2024-02-18 NOTE — Telephone Encounter (Signed)
 Refill sent to pharmacy.

## 2024-02-18 NOTE — Telephone Encounter (Signed)
 You may fill amlodipine  5 mg under my name #90 with 3 refills for daily use

## 2024-02-18 NOTE — Telephone Encounter (Signed)
 Copied from CRM [id number]. Topic: Clinical - Prescription Issue >> Feb 17, 2024  4:52 PM Lavanda D wrote: Reason for CRM: Reason for CRM: Patient's daughter is calling in regards to the amLODipine  (NORVASC ) 5 MG tablet [Pharmacy Med Name: amlodipine  5 mg tablet] [[id number]] It was previously authorized by a different provider but the pharmacy has reached out to both the prescribing provider as well as Dr. Katrinka and has not heard back from either. Patient is in need of this medication and is hoping that Dr. Katrinka can authorize the refill.   [facility] [place], [place] - [address]  Please see pt msg and advise if ok to refill Rx prev filled by another provider

## 2024-02-21 ENCOUNTER — Ambulatory Visit (INDEPENDENT_AMBULATORY_CARE_PROVIDER_SITE_OTHER): Payer: Self-pay

## 2024-02-21 DIAGNOSIS — R55 Syncope and collapse: Secondary | ICD-10-CM | POA: Diagnosis not present

## 2024-02-21 LAB — CUP PACEART REMOTE DEVICE CHECK
Date Time Interrogation Session: 20250714002903
Implantable Pulse Generator Implant Date: 20240530

## 2024-02-22 ENCOUNTER — Ambulatory Visit: Payer: Self-pay | Admitting: Cardiology

## 2024-02-22 DIAGNOSIS — H5340 Unspecified visual field defects: Secondary | ICD-10-CM | POA: Diagnosis not present

## 2024-02-29 ENCOUNTER — Other Ambulatory Visit: Payer: Self-pay | Admitting: Family Medicine

## 2024-03-07 ENCOUNTER — Ambulatory Visit

## 2024-03-07 ENCOUNTER — Other Ambulatory Visit: Payer: Self-pay | Admitting: Family Medicine

## 2024-03-10 NOTE — Addendum Note (Signed)
 Addended by: VICCI SELLER A on: 03/10/2024 02:26 PM   Modules accepted: Orders

## 2024-03-10 NOTE — Progress Notes (Signed)
 Carelink Summary Report / Loop Recorder

## 2024-03-13 ENCOUNTER — Encounter: Payer: Self-pay | Admitting: Cardiology

## 2024-03-13 ENCOUNTER — Ambulatory Visit: Attending: Cardiology | Admitting: Cardiology

## 2024-03-13 VITALS — BP 171/73 | HR 81 | Ht 66.0 in | Wt 195.0 lb

## 2024-03-13 DIAGNOSIS — R600 Localized edema: Secondary | ICD-10-CM | POA: Diagnosis not present

## 2024-03-13 DIAGNOSIS — N184 Chronic kidney disease, stage 4 (severe): Secondary | ICD-10-CM

## 2024-03-13 DIAGNOSIS — I455 Other specified heart block: Secondary | ICD-10-CM

## 2024-03-13 DIAGNOSIS — I779 Disorder of arteries and arterioles, unspecified: Secondary | ICD-10-CM

## 2024-03-13 DIAGNOSIS — I25118 Atherosclerotic heart disease of native coronary artery with other forms of angina pectoris: Secondary | ICD-10-CM | POA: Insufficient documentation

## 2024-03-13 DIAGNOSIS — Z9181 History of falling: Secondary | ICD-10-CM | POA: Diagnosis not present

## 2024-03-13 DIAGNOSIS — I10 Essential (primary) hypertension: Secondary | ICD-10-CM | POA: Diagnosis not present

## 2024-03-13 DIAGNOSIS — Z7409 Other reduced mobility: Secondary | ICD-10-CM | POA: Diagnosis not present

## 2024-03-13 DIAGNOSIS — Z7901 Long term (current) use of anticoagulants: Secondary | ICD-10-CM

## 2024-03-13 DIAGNOSIS — Z952 Presence of prosthetic heart valve: Secondary | ICD-10-CM | POA: Diagnosis not present

## 2024-03-13 DIAGNOSIS — I480 Paroxysmal atrial fibrillation: Secondary | ICD-10-CM | POA: Diagnosis not present

## 2024-03-13 DIAGNOSIS — R55 Syncope and collapse: Secondary | ICD-10-CM

## 2024-03-13 NOTE — Patient Instructions (Addendum)
 Medication Instructions:   No  changes  *If you need a refill on your cardiac medications before your next appointment, please call your pharmacy*   Lab Work: Not needed   Testing/Procedures: Not needed   Follow-Up: At [facility], you and your health needs are our priority.  As part of our continuing mission to provide you with exceptional heart care, we have created designated Provider Care Teams.  These Care Teams include your primary Cardiologist (physician) and Advanced Practice Providers (APPs -  Physician Assistants and Nurse Practitioners) who all work together to provide you with the care you need, when you need it.     Your next appointment:   12 month(s)  The format for your next appointment:   In Person  Provider:   Alm Clay, MD   Other Instructions  Dr clay would like for you  to pressure --Continuous passive motion machine

## 2024-03-13 NOTE — Progress Notes (Signed)
 Cardiology Office Note:  .   Date:  03/19/2024  ID:  Mark Jackson., DOB May 04, 1930, MRN [id number] PCP/Referring Provider: Katrinka Garnette KIDD, MD   [facility] Providers Cardiologist:  Alm Clay, MD Electrophysiologist:  OLE ONEIDA HOLTS, MD     Chief Complaint  Patient presents with   Follow-up    Delayed 3-month follow-up.  Doing well.   Coronary Artery Disease    No angina   Atrial Fibrillation    Not aware of symptoms.    Patient Profile: .     Dequincy Born. is a very pleasant 89 y.o. male with a PMH reviewed below who presents here for delayed 41-month follow-up.   CAD (non-STEMI) -> DES x 2 with overlapping proximal LAD stent NSTEMI 03/2015: Proximal LAD 80% => Synergy XD 2.75 mm x 16 mm -> postdilated to 3.3 mm. Progressive angina 05/2016: Ostial LAD 85% (highly positive by FFR => Synergy XD 3.0 mm x 12 mm, overlapping prior stent-> postdilated to 3.5 mm including overlap. Pre-TAVR cath 04/26/2019: Widely patent ostial to proximal overlapping DES stents.  Moderate to severe AS with P-peak gradient of 36 mm.  Mean of 31 mg 3. Longstanding PAF complicated by occasional sinus pauses-ILR in place to evaluate for syncope 6.4-second pause on event monitor  -> s/p ILR placement & diltiazem  discontinued; he felt that his main rhythm was Wenckebach block Severe AS with low-flow low gradient-s/p TAVR 12/12/2019 (26 mm SAPIEN TAVR) Echo 01/05/2024: Well-seated valve with trivial PVI.  Mean AVG 15 mmHg.  DI 0.31.  (Stable compared to 12/2019-mean AVG 14) History of CVA (May 2023): Left Thalamic/subacute cerebellar Stage IV CKD Chronic BLE edema related to venous stasis Carotid Artery Disease: (May 2024) 80-90% R ICA & ~50% RECA with 139% LICA. Seen by Dr. Magda October 2024: Did not feel like he was a good candidate for invasive management of the stenosis.  Recommended aggressive risk factor modification.  Plan was 77-month follow-up.  I last saw him in June 2024  following his hospitalization for syncope with 6.4-second pause noted on monitor.  EP was consulted and the recommendation was stopping the low-dose diltiazem , and placement of ILR instead of PPM.  Diltiazem  was converted to amlodipine  2.5 mg daily.     Mark Jackson. was seen by Dr. HOLTS on 03/10/2023-he noted intermittent episodes of chest pain or pressure.  Imdur  increased to 90 mg daily.  No prolonged pauses or prolonged episodes noted on ILR.  Continue to recommend avoiding AV nodal agents but no PPM.  BP was elevated-recommended close follow-up.  He was last seen on 06/14/2023 Katlyn West: He was accompanied by his caregiver nurse.  Noted that he was doing well very well for being almost 89.  No major complaints.  Rare episodes of chest discomfort which resolved with rest.  No further aphasia episodes or strokelike episodes since ER visit in October where he presented with aphasia and dysarthria lasting 10 minutes-consistent with TIA.  Chronic BLE edema but no PND orthopnea.  No further chest pain.  No further syncope or near syncope.  Still has chronic dizziness.  Becoming more sedentary.  No longer going to work. Follow-up TAVR Echo ordered    Subjective  Discussed the use of AI scribe software for clinical note transcription with the patient, who gave verbal consent to proceed.  History of Present Illness  Armstrong Creasy. is a 89 year old male with complex PMH reviewed above who presents for follow-up  regarding his loop recorder and blood pressure management.  He is accompanied today by his daughter.  He has a history of atrial fibrillation and had a loop recorder placed to monitor his heart rhythm. He has not been aware of fast heart rates or palpitations since the device was implanted. There was a two-month period where no data was recorded due to a technical issue, which has since been resolved. He has experienced falls, notably in June, where he fell twice in two days, hitting  his head on one occasion. He does not recall passing out during these events. He uses a walker for mobility and has help during the day. 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 He reports minimal physical activity, moving mainly between his bedroom and kitchen. He owns a stationary pedal bike and a New Step machine but has not been using them regularly due to a mechanical issue with the bike.  12/1998 and is but those patients there  He has hypertension and is currently taking amlodipine  2.5 mg daily. His blood pressure was recorded at 171/73 mmHg during the visit.  He experiences swelling in his legs and takes furosemide  40 mg twice daily, with an additional dose as needed. He sleeps with his feet slightly elevated but does not use support socks. No shortness of breath when lying flat or waking up due to breathing difficulties. He reports getting up four times a night to urinate.  He reports knee pain and takes Tylenol  for pain management. He has tramadol  available for use as needed but primarily relies on Tylenol .   Cardiovascular ROS: positive for - dyspnea on exertion, edema, and however, he remains somewhat sedentary.  He sleeps sitting upright somewhat, but really denies any true PND or orthopnea.  Edema is chronic and persistent.  (No poorly healing wounds or lesions.  Chronic dizziness but no real syncope or near syncope.) negative for - irregular heartbeat, loss of consciousness, orthopnea, palpitations, paroxysmal nocturnal dyspnea, rapid heart rate, shortness of breath, or TIA or amaurosis fugax, claudication.  ROS:  Review of Systems - Negative except symptoms noted above    Objective   Medications - Amlodipine  2.5 mg - Eliquis  2.5 mg twice a day - Zetia  10 mg daily; rosuvastatin  10 mg nightly. -Imdur  90 mg daily.  (1.5 x 60 mg tab) - Furosemide  40 mg twice daily  PRN - Tylenol  daily along with Voltaren  gel - Ferrous sulfate  325 mg daily; Prozac  10 mg daily;  Topamax  100 mg twice daily.  Social History - Employment: Retired Office manager - Living Situation: Lives at home with help during the day, five days a week. - The patient is 89 years old, uses a walker for mobility, and has a son who runs his former office. He attends a men's breakfast club weekly, although he has not attended recently due to his friend's illness. The patient has experienced falls, particularly in the kitchen, and is concerned about the risk of falls versus the risk of stroke due to anticoagulation therapy. He has a stationary bike and a new step machine for exercise but has issues with the bike's pedal. He is encouraged to walk more to maintain mobility.  Studies Reviewed: SABRA   EKG Interpretation Date/Time:  Monday March 13 2024 11:56:25 EDT Ventricular Rate:  81 PR Interval:  242 QRS Duration:  96 QT Interval:  400 QTC Calculation: 464 R Axis:   -41  Text Interpretation: Sinus rhythm with 1st  degree A-V block Left axis deviation Left ventricular hypertrophy with repolarization abnormality ( R in aVL , Cornell product ) Possible Lateral infarct , age undetermined When compared with ECG of 14-Jun-2023 11:15, Borderline criteria for Lateral infarct are now Present due ot Poor R wave progression Confirmed by Anner Lenis ([id number]) on 03/13/2024 12:29:01 PM    Lab Results  Component Value Date   NA 141 09/24/2023   K 4.7 09/24/2023   CREATININE 2.38 (H) 09/24/2023   GFRNONAA 21 (L) 05/12/2023   GLUCOSE 94 09/24/2023   Lab Results  Component Value Date   CHOL 104 01/29/2023   HDL 35.70 (L) 01/29/2023   LDLCALC 46 01/29/2023   LDLDIRECT 38.0 07/16/2022   TRIG 113.0 01/29/2023   CHOLHDL 3 01/29/2023    ECHO: Normal LV size and function with EF 55%.  No RWMA.  GR 1 DD.  Normal RV size and function with mildly elevated PAP estimated RVSP 36 mmHg.  Mild MR.  Bioprosthetic TAVR valve (26 mm Edwards SAPIEN) stable with mean gradient of 15 mmHg.  DI 0.31.   (12/2023)   Previously reviewed studies Echo : EF 55 to 60%.  GR 1 DD.  No RWMA.  Normal RV.  Trivial MR.  TAVR valve with mean AVG 14 mmHg.  (12/2021 AVG was 10).  (12/24/2022) R &L CATH: Moderate-Severe AoV Stenosis (PM peak AVG 36 mm artery, mean AVG 31 mmHg).  Widely patent ostia for possible LAD overlapping stents.  Otherwise normal coronaries.    Risk Assessment/Calculations:    CHA2DS2-VASc Score =     This indicates a  % annual risk of stroke. The patient's score is based upon:      HYPERTENSION CONTROL Vitals:   03/13/24 1149   BP: (!) 171/73 (!) 158/68    The patient's blood pressure is elevated above target today.  In order to address the patient's elevated BP: Allowing for mild permissive hypertension.  Take additional dose of amlodipine  for SBP sustained > 160 mmHg.          STOP-Bang Score:  6      -> Patient declines evaluation  Physical Exam:   VS:  BP (!) 158/68   Pulse 81   Ht 5' 6 (1.676 m)   Wt 195 lb (88.5 kg)   SpO2 99%   BMI 31.47 kg/m    Wt Readings from Last 3 Encounters:  03/13/24 195 lb (88.5 kg)  01/14/24 184 lb 11.9 oz (83.8 kg)  12/02/23 183 lb (83 kg)    GEN: Well nourished, well groomed in no acute distress; very pleasant.  Memory intact.  Seems a little bit more down than usual. NECK: No JVD; No carotid bruits; right-sided bruit. CARDIAC:  RRR, Normal S1, and S2; stable harsh 2/6 SEM at RUSB-neck., rubs, gallops RESPIRATORY:  Clear to auscultation without rales, wheezing or rhonchi ; nonlabored, good air movement. ABDOMEN: Soft, non-tender, non-distended EXTREMITIES: 2-3+ BLE swelling up to knees-stable.  Mild tenderness, but no wounds or lesions.  No deformity.  Unable to really palpate pedal pulses.    ASSESSMENT AND PLAN: .    Problem List Items Addressed This Visit       Cardiology Problems   CAD S/P DES PCI to proximal LAD - stable Angina - Primary (Chronic)   He is almost 8 years out from his most recent PCI (October 2017)  with stents widely patent in 2020. No recurrent anginal symptoms. No longer on antiplatelet agent due to his Eliquis  No beta-blocker  or nondihydropyridine calcium  channel blockers because of bradycardia fatigue - On combination of Zetia  10 mg daily along with rosuvastatin  10 mg daily for lipid management - On Imdur  90 mg daily along with amlodipine  2.5 mg daily for BP/antianginal      Relevant Orders   EKG 12-Lead (Completed)   For home use only DME Continuous passive motion machine   Essential hypertension (Chronic)   Poorly controlled BP today.  Usually better than this.  Had not yet taken his medication today. Allow pressure mild permissive hypertension. - Continue amlodipine  2.5 mg daily.;  For sustained SBP greater than 160 mmHg (after 1 hour) okay to take additional dose of amlodipine .      Relevant Orders   EKG 12-Lead (Completed)   PAF (paroxysmal atrial fibrillation) (HCC) (Chronic)   Minimal AFib episodes recorded on loop recorder.SABRA  He is pretty much is asymptomatic when he has A-fib other than CHF symptoms. Anticoagulation with Eliquis  due to stroke risk. => Prolonged discussion to evaluate fall risk versus stroke risk. - No rate control agents based on bradycardia and pauses on low-dose diltiazem .-No AV nodal agents. - Continue Eliquis  2.5 mg twice daily for CVA prophylaxis-renal dose.. - Monitor falls and AFib frequency. - Consider discontinuing Eliquis  if falls increase and AFib remains minimal.      Right-sided carotid artery disease (HCC) (Chronic)   Last seen by Dr. Magda fall 2024.  Not felt to be a good candidate for TCAR or CEA.  Recommended aggressive medical management      Sinus pause (Chronic)   No notable prolonged pauses on ILR. Continue to avoid AV nodal agents.        Other   At high risk for injury related to fall   Falls possibly due to balance issues from edema. Walker use beneficial. Discussed fall risk versus anticoagulation benefits. -  Encourage walker use for stability. - Monitor falls and reassess anticoagulation if falls increase.      Bilateral lower extremity edema (Chronic)   Edema likely due to venous insufficiency. Persistent despite diuretics and leg elevation. Non-compliance with support socks noted. - Prescribe SCDs for fluid mobilization during inactivity. - Encourage leg elevation with specialized pillow. - Advise use of stationary pedal bike for leg activity.      Relevant Orders   For home use only DME Continuous passive motion machine   Chronic kidney disease (CKD), stage IV (severe) (HCC) (Chronic)   Most recent creatinine was 2.38 which is improved for him from 2.7.  Followed by Dr. Prescilla from nephrology. - Renally dosed Eliquis  at 2.5 mL daily -Plan has been to allow for mild permissive hypertension to avoid orthostatic hypotension.      Limited mobility (Chronic)   Mobility limitation with walker dependence-along with chronic knee pain Pain limits mobility. Current management with Tylenol . Discussed tramadol  for musculoskeletal pain. - Continue Tylenol . - Consider tramadol  as needed, not routinely.  Limited activity, primarily home ambulation. Encouraged increased activity for joint and cardiovascular health. - Encourage increased physical activity, including stationary pedal bike and walking. - Advise attending men's breakfast club for social interaction and mobility.      Long term current use of anticoagulant therapy: CHA2DS2-VASc 7 -> Eliquis  2.5 mg twice daily (Chronic)   Remains on Eliquis  2.5 mg daily for CVA prophylaxis. Unfortunately, he now he has persistent falls, and I discussed with his daughter that we may want to consider if he would potentially be better off not being on DOAC for fear of  potential significant fall with head injury. - Plan is for family discussion on this issue to determine best course of action.  Currently waiting on continuing DOAC.      S/P TAVR  (transcatheter aortic valve replacement) (Chronic)   TAVR in 2021 with follow-up echocardiogram showing stable AVG of 15 mmHg. Continue to follow with annual echoes.      Syncope (Chronic)   Recent episodes of recurrent syncope.  He does have dizziness but there is no change. Continue to allow for permissive hypertension to avoid orthostasis and avoid AV nodal agents to avoid bradycardia or pauses.      Other Visit Diagnoses       Paroxysmal atrial fibrillation (HCC)       Relevant Orders   EKG 12-Lead (Completed)            Follow-Up: Return in about 1 year (around 03/13/2025) for Routine follow up with me, [employer].  Total time spent: 31 min spent with patient + 26 min spent charting = 57 min I spent 57 minutes in the care of [employer]. today including reviewing labs (1 minute), reviewing studies (3 minutes to review current echocardiogram with previous echocardiograms, 2 minutes reviewing prior Films-5 minutes total), face to face time discussing treatment options (31), reviewing records from EP and APP notes (8 minutes), 12 minutes dictating, and documenting in the encounter.     Signed, Alm MICAEL Clay, MD, MS Alm Clay, M.D., M.S. Interventional Chartered certified accountant  Pager # [phone]

## 2024-03-17 ENCOUNTER — Other Ambulatory Visit: Payer: Self-pay | Admitting: Family Medicine

## 2024-03-17 NOTE — Telephone Encounter (Signed)
 Last OV 10/28/23 Next OV 05/02/24  Last refill 03/10/23 Qty #135/3

## 2024-03-19 ENCOUNTER — Encounter: Payer: Self-pay | Admitting: Cardiology

## 2024-03-19 DIAGNOSIS — Z7409 Other reduced mobility: Secondary | ICD-10-CM | POA: Insufficient documentation

## 2024-03-19 DIAGNOSIS — Z9181 History of falling: Secondary | ICD-10-CM | POA: Insufficient documentation

## 2024-03-19 NOTE — Assessment & Plan Note (Signed)
 He is almost 8 years out from his most recent PCI (October 2017) with stents widely patent in 2020. No recurrent anginal symptoms. No longer on antiplatelet agent due to his Eliquis  No beta-blocker or nondihydropyridine calcium  channel blockers because of bradycardia fatigue - On combination of Zetia  10 mg daily along with rosuvastatin  10 mg daily for lipid management - On Imdur  90 mg daily along with amlodipine  2.5 mg daily for BP/antianginal

## 2024-03-19 NOTE — Assessment & Plan Note (Signed)
 Poorly controlled BP today.  Usually better than this.  Had not yet taken his medication today. Allow pressure mild permissive hypertension. - Continue amlodipine  2.5 mg daily.;  For sustained SBP greater than 160 mmHg (after 1 hour) okay to take additional dose of amlodipine .

## 2024-03-19 NOTE — Assessment & Plan Note (Signed)
 Most recent creatinine was 2.38 which is improved for him from 2.7.  Followed by Dr. Prescilla from nephrology. - Renally dosed Eliquis  at 2.5 mL daily -Plan has been to allow for mild permissive hypertension to avoid orthostatic hypotension.

## 2024-03-19 NOTE — Assessment & Plan Note (Signed)
 Remains on Eliquis  2.5 mg daily for CVA prophylaxis. Unfortunately, he now he has persistent falls, and I discussed with his daughter that we may want to consider if he would potentially be better off not being on DOAC for fear of potential significant fall with head injury. - Plan is for family discussion on this issue to determine best course of action.  Currently waiting on continuing DOAC.

## 2024-03-19 NOTE — Assessment & Plan Note (Signed)
 Falls possibly due to balance issues from edema. Walker use beneficial. Discussed fall risk versus anticoagulation benefits. - Encourage walker use for stability. - Monitor falls and reassess anticoagulation if falls increase.

## 2024-03-19 NOTE — Assessment & Plan Note (Addendum)
 Minimal AFib episodes recorded on loop recorder.SABRA  He is pretty much is asymptomatic when he has A-fib other than CHF symptoms. Anticoagulation with Eliquis  due to stroke risk. => Prolonged discussion to evaluate fall risk versus stroke risk. - No rate control agents based on bradycardia and pauses on low-dose diltiazem .-No AV nodal agents. - Continue Eliquis  2.5 mg twice daily for CVA prophylaxis-renal dose.. - Monitor falls and AFib frequency. - Consider discontinuing Eliquis  if falls increase and AFib remains minimal.

## 2024-03-19 NOTE — Assessment & Plan Note (Signed)
 TAVR in 2021 with follow-up echocardiogram showing stable AVG of 15 mmHg. Continue to follow with annual echoes.

## 2024-03-19 NOTE — Assessment & Plan Note (Signed)
 Edema likely due to venous insufficiency. Persistent despite diuretics and leg elevation. Non-compliance with support socks noted. - Prescribe SCDs for fluid mobilization during inactivity. - Encourage leg elevation with specialized pillow. - Advise use of stationary pedal bike for leg activity.

## 2024-03-19 NOTE — Assessment & Plan Note (Signed)
 Last seen by Dr. Magda fall 2024.  Not felt to be a good candidate for TCAR or CEA.  Recommended aggressive medical management

## 2024-03-19 NOTE — Assessment & Plan Note (Signed)
 Mobility limitation with walker dependence-along with chronic knee pain Pain limits mobility. Current management with Tylenol . Discussed tramadol  for musculoskeletal pain. - Continue Tylenol . - Consider tramadol  as needed, not routinely.  Limited activity, primarily home ambulation. Encouraged increased activity for joint and cardiovascular health. - Encourage increased physical activity, including stationary pedal bike and walking. - Advise attending men's breakfast club for social interaction and mobility.

## 2024-03-19 NOTE — Assessment & Plan Note (Signed)
 Recent episodes of recurrent syncope.  He does have dizziness but there is no change. Continue to allow for permissive hypertension to avoid orthostasis and avoid AV nodal agents to avoid bradycardia or pauses.

## 2024-03-19 NOTE — Assessment & Plan Note (Signed)
 No notable prolonged pauses on ILR. Continue to avoid AV nodal agents.

## 2024-03-23 ENCOUNTER — Ambulatory Visit (INDEPENDENT_AMBULATORY_CARE_PROVIDER_SITE_OTHER): Payer: Self-pay

## 2024-03-23 DIAGNOSIS — I480 Paroxysmal atrial fibrillation: Secondary | ICD-10-CM

## 2024-03-23 LAB — CUP PACEART REMOTE DEVICE CHECK
Date Time Interrogation Session: 20250813233848
Implantable Pulse Generator Implant Date: 20240530

## 2024-03-25 ENCOUNTER — Ambulatory Visit: Payer: Self-pay | Admitting: Cardiology

## 2024-04-06 ENCOUNTER — Other Ambulatory Visit: Payer: Self-pay | Admitting: Family

## 2024-04-06 ENCOUNTER — Other Ambulatory Visit: Payer: Self-pay | Admitting: Family Medicine

## 2024-04-11 ENCOUNTER — Ambulatory Visit (INDEPENDENT_AMBULATORY_CARE_PROVIDER_SITE_OTHER)

## 2024-04-11 DIAGNOSIS — E538 Deficiency of other specified B group vitamins: Secondary | ICD-10-CM | POA: Diagnosis not present

## 2024-04-11 MED ORDER — CYANOCOBALAMIN 1000 MCG/ML IJ SOLN
1000.0000 ug | Freq: Once | INTRAMUSCULAR | Status: AC
  Administered 2024-04-11: 1000 ug via INTRAMUSCULAR

## 2024-04-11 NOTE — Progress Notes (Signed)
 Patient is in office today for a nurse visit for B12 Injection. Patient Injection was given in the  Left deltoid. Patient tolerated injection well.

## 2024-04-24 ENCOUNTER — Other Ambulatory Visit: Payer: Self-pay | Admitting: Family Medicine

## 2024-04-24 ENCOUNTER — Ambulatory Visit (INDEPENDENT_AMBULATORY_CARE_PROVIDER_SITE_OTHER): Payer: Self-pay

## 2024-04-24 DIAGNOSIS — I480 Paroxysmal atrial fibrillation: Secondary | ICD-10-CM

## 2024-04-24 DIAGNOSIS — Z7901 Long term (current) use of anticoagulants: Secondary | ICD-10-CM

## 2024-04-24 LAB — CUP PACEART REMOTE DEVICE CHECK
Date Time Interrogation Session: 20250913232630
Implantable Pulse Generator Implant Date: 20240530

## 2024-04-24 NOTE — Telephone Encounter (Unsigned)
 Copied from CRM [id number]. Topic: Clinical - Medication Refill >> Apr 24, 2024  1:37 PM Mark Jackson wrote: Medication: ELIQUIS  2.5  Has the patient contacted their pharmacy? No (Agent: If no, request that the patient contact the pharmacy for the refill. If patient does not wish to contact the pharmacy document the reason why and proceed with request.) (Agent: If yes, when and what did the pharmacy advise?)  This is the patient's preferred pharmacy:  [facility] [phone]  Is this the correct pharmacy for this prescription? Yes If no, delete pharmacy and type the correct one.   Has the prescription been filled recently? Yes  Is the patient out of the medication? No  Has the patient been seen for an appointment in the last year OR does the patient have an upcoming appointment? Yes  Can we respond through MyChart? Yes  Agent: Please be advised that Rx refills may take up to 3 business days. We ask that you follow-up with your pharmacy.

## 2024-04-25 ENCOUNTER — Ambulatory Visit: Payer: Self-pay | Admitting: Cardiology

## 2024-04-25 MED ORDER — APIXABAN 2.5 MG PO TABS: 2.5000 mg | ORAL_TABLET | Freq: Two times a day (BID) | ORAL | 2 refills | Status: DC

## 2024-04-29 NOTE — Progress Notes (Signed)
 Remote Loop Recorder Transmission

## 2024-05-02 ENCOUNTER — Ambulatory Visit: Admitting: Family Medicine

## 2024-05-02 ENCOUNTER — Other Ambulatory Visit: Payer: Self-pay | Admitting: Family Medicine

## 2024-05-03 ENCOUNTER — Encounter: Payer: Self-pay | Admitting: Family Medicine

## 2024-05-03 ENCOUNTER — Ambulatory Visit (INDEPENDENT_AMBULATORY_CARE_PROVIDER_SITE_OTHER): Admitting: Family Medicine

## 2024-05-03 VITALS — BP 148/82 | HR 61 | Temp 97.9°F | Ht 66.0 in | Wt 192.0 lb

## 2024-05-03 DIAGNOSIS — I10 Essential (primary) hypertension: Secondary | ICD-10-CM | POA: Diagnosis not present

## 2024-05-03 DIAGNOSIS — I480 Paroxysmal atrial fibrillation: Secondary | ICD-10-CM

## 2024-05-03 DIAGNOSIS — Z131 Encounter for screening for diabetes mellitus: Secondary | ICD-10-CM | POA: Diagnosis not present

## 2024-05-03 DIAGNOSIS — I5032 Chronic diastolic (congestive) heart failure: Secondary | ICD-10-CM | POA: Insufficient documentation

## 2024-05-03 DIAGNOSIS — E538 Deficiency of other specified B group vitamins: Secondary | ICD-10-CM

## 2024-05-03 DIAGNOSIS — N184 Chronic kidney disease, stage 4 (severe): Secondary | ICD-10-CM | POA: Diagnosis not present

## 2024-05-03 DIAGNOSIS — G8191 Hemiplegia, unspecified affecting right dominant side: Secondary | ICD-10-CM | POA: Diagnosis not present

## 2024-05-03 DIAGNOSIS — R739 Hyperglycemia, unspecified: Secondary | ICD-10-CM | POA: Diagnosis not present

## 2024-05-03 DIAGNOSIS — E785 Hyperlipidemia, unspecified: Secondary | ICD-10-CM

## 2024-05-03 LAB — LIPID PANEL
Cholesterol: 105 mg/dL (ref 0–200)
HDL: 42.6 mg/dL (ref >39.00)
LDL Cholesterol: 44 mg/dL (ref 0–99)
NonHDL: 62.19
Total CHOL/HDL Ratio: 2
Triglycerides: 90 mg/dL (ref 0.0–149.0)
VLDL: 18 mg/dL (ref 0.0–40.0)

## 2024-05-03 LAB — HEMOGLOBIN A1C: Hgb A1c MFr Bld: 6.5 % (ref 4.6–6.5)

## 2024-05-03 LAB — COMPREHENSIVE METABOLIC PANEL WITH GFR
ALT: 12 U/L (ref 0–53)
AST: 21 U/L (ref 0–37)
Albumin: 3.8 g/dL (ref 3.5–5.2)
Alkaline Phosphatase: 65 U/L (ref 39–117)
BUN: 48 mg/dL — ABNORMAL HIGH (ref 6–23)
CO2: 24 meq/L (ref 19–32)
Calcium: 8.8 mg/dL (ref 8.4–10.5)
Chloride: 109 meq/L (ref 96–112)
Creatinine, Ser: 2.83 mg/dL — ABNORMAL HIGH (ref 0.40–1.50)
GFR: 18.85 mL/min — ABNORMAL LOW (ref >60.00)
Glucose, Bld: 98 mg/dL (ref 70–99)
Potassium: 4.5 meq/L (ref 3.5–5.1)
Sodium: 141 meq/L (ref 135–145)
Total Bilirubin: 0.4 mg/dL (ref 0.2–1.2)
Total Protein: 6.8 g/dL (ref 6.0–8.3)

## 2024-05-03 LAB — CBC WITH DIFFERENTIAL/PLATELET
Basophils Absolute: 0.1 K/uL (ref 0.0–0.1)
Basophils Relative: 0.5 % (ref 0.0–3.0)
Eosinophils Absolute: 0.6 K/uL (ref 0.0–0.7)
Eosinophils Relative: 5.1 % — ABNORMAL HIGH (ref 0.0–5.0)
HCT: 37.2 % — ABNORMAL LOW (ref 39.0–52.0)
Hemoglobin: 12.1 g/dL — ABNORMAL LOW (ref 13.0–17.0)
Lymphocytes Relative: 11.9 % — ABNORMAL LOW (ref 12.0–46.0)
Lymphs Abs: 1.3 K/uL (ref 0.7–4.0)
MCHC: 32.6 g/dL (ref 30.0–36.0)
MCV: 101.8 fl — ABNORMAL HIGH (ref 78.0–100.0)
Monocytes Absolute: 1.2 K/uL — ABNORMAL HIGH (ref 0.1–1.0)
Monocytes Relative: 10.8 % (ref 3.0–12.0)
Neutro Abs: 8.1 K/uL — ABNORMAL HIGH (ref 1.4–7.7)
Neutrophils Relative %: 71.7 % (ref 43.0–77.0)
Platelets: 135 K/uL — ABNORMAL LOW (ref 150.0–400.0)
RBC: 3.65 Mil/uL — ABNORMAL LOW (ref 4.22–5.81)
RDW: 13.2 % (ref 11.5–15.5)
WBC: 11.3 K/uL — ABNORMAL HIGH (ref 4.0–10.5)

## 2024-05-03 LAB — VITAMIN B12: Vitamin B-12: 478 pg/mL (ref 211–911)

## 2024-05-03 NOTE — Progress Notes (Signed)
 Phone [phone] In person visit   Subjective:   Leonel Mccollum. is a 89 y.o. year old very pleasant male patient who presents for/with See problem oriented charting Chief Complaint  Patient presents with   Hypertension   Past Medical History-  Patient Active Problem List   Diagnosis Date Noted   Right hemiparesis (HCC) 03/05/2022    Priority: High   S/P TAVR (transcatheter aortic valve replacement) 12/12/2019    Priority: High   CAD S/P DES PCI to proximal LAD - stable Angina 03/22/2015    Priority: High   PAF (paroxysmal atrial fibrillation) (HCC) 08/20/2014    Priority: High    Class: Diagnosis of   Chronic kidney disease (CKD), stage IV (severe) (HCC) 08/20/2014    Priority: High   Personal history of prostate cancer 10/22/2008    Priority: High   Hyperglycemia 09/27/2017    Priority: Medium    B12 deficiency 01/06/2017    Priority: Medium    BPH associated with nocturia 06/15/2016    Priority: Medium    Aortic stenosis 03/14/2015    Priority: Medium    Hereditary and idiopathic peripheral neuropathy 01/12/2014    Priority: Medium    H/O syncope 09/03/2013    Priority: Medium    Right-sided carotid artery disease 03/02/2013    Priority: Medium    Hyperlipidemia with target LDL less than 70 03/02/2013    Priority: Medium    Migraine without aura 10/26/2012    Priority: Medium    Macrocytic anemia 10/23/2008    Priority: Medium    Essential hypertension 10/22/2008    Priority: Medium    Excessive daytime sleepiness 10/22/2008    Priority: Medium    Thrombocytopenia, unspecified 12/11/2021    Priority: Low   Syncope 11/27/2019    Priority: Low   Fatigue 11/08/2017    Priority: Low   Chronic diarrhea 01/06/2017    Priority: Low   Perianal dermatitis 06/19/2015    Priority: Low   Rectal bleeding 04/25/2015    Priority: Low   Dyspnea 08/31/2014    Priority: Low   Long term current use of anticoagulant therapy: CHA2DS2-VASc 7 -> Eliquis  2.5 mg twice  daily 08/27/2014    Priority: Low   GLAUCOMA 10/23/2008    Priority: Low   Hemorrhoids 10/22/2008    Priority: Low   Arthropathy 10/22/2008    Priority: Low   History of colonic polyps 10/22/2008    Priority: Low   At high risk for injury related to fall 03/19/2024   Limited mobility 03/19/2024   Sinus pause 01/05/2023   Carotid stenosis, right 12/25/2022   Genetic testing 12/22/2022   Gait disturbance, post-stroke 07/28/2022   Situational depression 03/05/2022   Loud snoring 02/04/2022   Left thalamic infarction (HCC) 12/29/2021   Hematuria 12/11/2020   Bilateral lower extremity edema 04/15/2017   Crescendo angina (HCC) 06/01/2016    Medications- reviewed and updated Current Outpatient Medications  Medication Sig Dispense Refill   acetaminophen  (TYLENOL ) 325 MG tablet Take 2 tablets (650 mg total) by mouth every 4 (four) hours as needed for mild pain (or temp > 37.5 C (99.5 F)).     amLODipine  (NORVASC ) 5 MG tablet Take 0.5 tablets (2.5 mg total) by mouth daily. 90 tablet 3   apixaban  (ELIQUIS ) 2.5 MG TABS tablet Take 1 tablet (2.5 mg total) by mouth 2 (two) times daily. 180 tablet 2   diclofenac  Sodium (VOLTAREN ) 1 % GEL Apply 2 g topically 4 (four) times daily. 2  g 0   ezetimibe  (ZETIA ) 10 MG tablet Take 1 tablet (10 mg total) by mouth daily. 90 tablet 3   ferrous sulfate  325 (65 FE) MG tablet Take 325 mg by mouth daily with breakfast.     FLUoxetine  (PROZAC ) 10 MG capsule TAKE ONE CAPSULE BY MOUTH DAILY 30 capsule 2   furosemide  (LASIX ) 40 MG tablet Take 1 tablet (40 mg total) by mouth 2 (two) times daily as needed for fluid or edema (take twice daily only when instructed by physician). 30 tablet 3   isosorbide  mononitrate (IMDUR ) 60 MG 24 hr tablet Take 1&1/2 tablets (90 mg total) by mouth daily. 135 tablet 1   Multiple Vitamins-Minerals (CENTRUM SILVER ADULT 50+) TABS Take 1 tablet by mouth daily.     nitroGLYCERIN  (NITROSTAT ) 0.4 MG SL tablet take ONE TABLET UNDER TONGUE  WHEN NEEDED FOR CHEST PAIN. MAY REPEAT IN 5 MINUTES. 25 tablet 9   rosuvastatin  (CRESTOR ) 10 MG tablet TAKE ONE TABLET BY MOUTH AT BEDTIME 90 tablet 3   topiramate  (TOPAMAX ) 100 MG tablet Take 1 tablet (100 mg total) by mouth 2 (two) times daily. 60 tablet 3   No current facility-administered medications for this visit.     Objective:  BP (!) 148/82 Comment: no improvement on repeat but has not had bp meds yet  Pulse 61   Temp 97.9 F (36.6 C) (Temporal)   Ht 5' 6 (1.676 m)   Wt 192 lb (87.1 kg)   SpO2 97%   BMI 30.99 kg/m  Gen: NAD, resting comfortably CV: RRR no murmurs rubs or gallops Lungs: CTAB no crackles, wheeze, rhonchi Ext: stable 1+ edema Skin: warm, dry     Assessment and Plan   #Falls 2 in June- unclear trigger but none since then and reassuring CT neck and head  # seborrheic keratosis - has several on face and a few that he picks at as they irritate him- will schedule dermatology visit on his own to evaluation   #CAD status post DES PCI to proximal LAD- sees Dr. Anner #aortic stenosis status post Tavr#hyperlipidemia S: Medication:Aspirin  was not added as patient already on Eliquis  during May 2023 stroke hospitalization Crestor  10 mg (reduced from 20 due to renal function), Zetia  10 mg added after May 2023 stroke - also on imdur  60 mg to reduce chest pain - no chest pain or shortness of breath on this Lab Results  Component Value Date   CHOL 104 01/29/2023   HDL 35.70 (L) 01/29/2023   LDLCALC 46 01/29/2023   LDLDIRECT 38.0 07/16/2022   TRIG 113.0 01/29/2023   CHOLHDL 3 01/29/2023   A/P: coronary artery disease amongst other issues with atherosclerosis - asymptomatic continue current medications  Lipids at goal last year- prove again today with labs- continue current medications  - has close follow up with cardiolgoy with TAVR history  #right hemiparesis - noted from other stroke but mild  # Atrial fibrillation S: Rate controlled with no  medicine Anticoagulated with eliquis  2.5 mg BID A/P:  rate controlled without medicine- anticoagulated appropriately   #CKD IV- sees Dr. Lionel in past - no dialysis planned- he has nto seen in many years- he prefers to hold off on follow up unless worsening- slightly better last visit -Lasix  increased to 40 mg December 2023  #hypertension with diastolic CHF S: medication: amlodipine  2.5 mg (half of 5 mg), lasix  40 mg daily, imdur  60 mg daily A/P: Dr. Anner has been ok with permissive hypertension to avoid lows- he  hasn't had medications yet today so suspect will trend down once he gets those in regardless- continue current medications for now  CHF appears euvolemic- continue current medications   # Depression S: Medication: fluoxetine  10 mg capsule    05/03/2024   11:34 AM 12/02/2023   11:27 AM 09/24/2023    3:09 PM  Depression screen PHQ 2/9  Decreased Interest 1 0 0  Down, Depressed, Hopeless 2 0 0  PHQ - 2 Score 3 0 0  Altered sleeping 0    Tired, decreased energy 0    Change in appetite 0    Feeling bad or failure about yourself  0    Trouble concentrating 0    Moving slowly or fidgety/restless 0    Suicidal thoughts 0    PHQ-9 Score 3    Difficult doing work/chores Not difficult at all    A/P: full remission- continue current medications   #Migraines- still with headache(s) even with topamax  100 mg twice daily  # B12 deficiency S: Current treatment/medication (oral vs. IM):  injections every 5 weeks at home in the past-prefers in office if possible A/P: has done well on q5 weeks- update today   Recommended follow up: No follow-ups on file. Future Appointments  Date Time Provider Department Center  05/11/2024 10:45 AM [facility] CLINICAL SUPPORT [facility]  05/25/2024  7:35 AM [facility] DEVICE REMOTES [facility]  06/26/2024  7:35 AM [facility] DEVICE REMOTES [facility]  12/11/2024 10:40 AM [facility] ANNUAL WELLNESS VISIT 1 [facility] [place]     Lab/Order associations: banana, raisins, 2 nabs   ICD-10-CM   1. Hyperlipidemia with target LDL less than 70  E78.5 Comprehensive metabolic panel with GFR    CBC with Differential/Platelet    Lipid panel    2. Hyperglycemia  R73.9 Hemoglobin A1c    3. B12 deficiency  E53.8 Vitamin B12    4. Screening for diabetes mellitus  Z13.1 Hemoglobin A1c    5. Essential hypertension  I10     6. PAF (paroxysmal atrial fibrillation) (HCC)  I48.0       No orders of the defined types were placed in this encounter.   Return precautions advised.  Garnette Lukes, MD

## 2024-05-03 NOTE — Progress Notes (Signed)
 Remote Loop Recorder Transmission

## 2024-05-03 NOTE — Patient Instructions (Addendum)
 Please stop by lab before you go If you have mychart- we will send your results within 3 business days of us  receiving them.  If you do not have mychart- we will call you about results within 5 business days of us  receiving them.  *please also note that you will see labs on mychart as soon as they post. I will later go in and write notes on them- will say notes from Dr. Katrinka Finder you are doing reasonably well! Keep going for 95 as your next awesome milestone  Get your blood pressure medications in when you get home  Recommended follow up: Return in about 6 months (around 10/31/2024) for followup or sooner if needed.Schedule b4 you leave.

## 2024-05-05 ENCOUNTER — Ambulatory Visit: Payer: Self-pay | Admitting: Family Medicine

## 2024-05-09 ENCOUNTER — Telehealth: Payer: Self-pay

## 2024-05-09 NOTE — Telephone Encounter (Signed)
 Copied from CRM [id number]. Topic: Clinical - Prescription Issue >> May 09, 2024 12:23 PM Henretta I wrote: Reason for CRM: [facility] called because  the mail order prescription for apixaban  (ELIQUIS ) 2.5 MG TABS tablet information needs to be faxed to [facility]

## 2024-05-09 NOTE — Telephone Encounter (Signed)
 Faxed today 05/09/2024.

## 2024-05-11 ENCOUNTER — Ambulatory Visit

## 2024-05-11 DIAGNOSIS — E538 Deficiency of other specified B group vitamins: Secondary | ICD-10-CM | POA: Diagnosis not present

## 2024-05-11 MED ORDER — CYANOCOBALAMIN 1000 MCG/ML IJ SOLN
1000.0000 ug | Freq: Once | INTRAMUSCULAR | Status: AC
  Administered 2024-05-11: 1000 ug via INTRAMUSCULAR

## 2024-05-11 NOTE — Progress Notes (Signed)
 Pt here for monthly B12 injection  Last B12 injection: 04/11/24  Last B12 level: 478 on 05/03/24  B12 1000mcg given IM R deltoid, and pt tolerated injection well.  Next B12 injection scheduled for: 06/13/2024 at 2:15pm

## 2024-05-18 NOTE — Progress Notes (Signed)
 Remote Loop Recorder Transmission

## 2024-05-23 ENCOUNTER — Ambulatory Visit (INDEPENDENT_AMBULATORY_CARE_PROVIDER_SITE_OTHER): Payer: Self-pay

## 2024-05-23 DIAGNOSIS — I480 Paroxysmal atrial fibrillation: Secondary | ICD-10-CM

## 2024-05-23 LAB — CUP PACEART REMOTE DEVICE CHECK
Date Time Interrogation Session: 20251013232800
Implantable Pulse Generator Implant Date: 20240530

## 2024-05-24 NOTE — Progress Notes (Signed)
 Remote Loop Recorder Transmission

## 2024-05-31 ENCOUNTER — Other Ambulatory Visit: Payer: Self-pay | Admitting: Family Medicine

## 2024-05-31 DIAGNOSIS — Z7901 Long term (current) use of anticoagulants: Secondary | ICD-10-CM

## 2024-05-31 DIAGNOSIS — I480 Paroxysmal atrial fibrillation: Secondary | ICD-10-CM

## 2024-06-06 ENCOUNTER — Ambulatory Visit: Payer: Self-pay | Admitting: Cardiology

## 2024-06-13 ENCOUNTER — Ambulatory Visit

## 2024-06-23 ENCOUNTER — Ambulatory Visit (INDEPENDENT_AMBULATORY_CARE_PROVIDER_SITE_OTHER): Payer: Self-pay

## 2024-06-23 DIAGNOSIS — I480 Paroxysmal atrial fibrillation: Secondary | ICD-10-CM

## 2024-06-23 LAB — CUP PACEART REMOTE DEVICE CHECK
Date Time Interrogation Session: 20251113232943
Implantable Pulse Generator Implant Date: 20240530

## 2024-06-27 ENCOUNTER — Ambulatory Visit: Payer: Self-pay | Admitting: Cardiology

## 2024-06-27 NOTE — Progress Notes (Signed)
 Remote Loop Recorder Transmission

## 2024-06-28 DIAGNOSIS — H353131 Nonexudative age-related macular degeneration, bilateral, early dry stage: Secondary | ICD-10-CM | POA: Diagnosis not present

## 2024-06-28 DIAGNOSIS — H04123 Dry eye syndrome of bilateral lacrimal glands: Secondary | ICD-10-CM | POA: Diagnosis not present

## 2024-06-30 ENCOUNTER — Other Ambulatory Visit: Payer: Self-pay | Admitting: Family Medicine

## 2024-07-19 ENCOUNTER — Ambulatory Visit: Admitting: Cardiology

## 2024-07-24 ENCOUNTER — Ambulatory Visit: Payer: Self-pay

## 2024-07-24 DIAGNOSIS — I480 Paroxysmal atrial fibrillation: Secondary | ICD-10-CM | POA: Diagnosis not present

## 2024-07-25 LAB — CUP PACEART REMOTE DEVICE CHECK
Date Time Interrogation Session: 20251214232920
Implantable Pulse Generator Implant Date: 20240530

## 2024-07-28 NOTE — Progress Notes (Signed)
 Remote Loop Recorder Transmission

## 2024-08-04 ENCOUNTER — Ambulatory Visit: Payer: Self-pay | Admitting: Cardiology

## 2024-08-10 ENCOUNTER — Other Ambulatory Visit: Payer: Self-pay | Admitting: Family Medicine

## 2024-08-21 ENCOUNTER — Ambulatory Visit: Payer: Self-pay | Admitting: *Deleted

## 2024-08-21 NOTE — Telephone Encounter (Signed)
 Appt scheduled at [place] tomorrow 7:50am. Dr. Katrinka will review triage notes.

## 2024-08-21 NOTE — Telephone Encounter (Signed)
 FYI Only or Action Required?: FYI only for provider: unable to appropriately triage - caller is not with patient.  Patient was last seen in primary care on 05/03/2024 by Katrinka Garnette KIDD, MD.  Called Nurse Triage reporting Leg Swelling (Diarrhea, difficulty breathing- SOB).  Symptoms began today.  Interventions attempted: Rest, hydration, or home remedies.  Symptoms are: unchanged.  Triage Disposition: See HCP Within 4 Hours (Or PCP Triage)  Patient/caregiver understands and will follow disposition?: Yes  Copied from CRM #[id number]. Topic: Clinical - Red Word Triage >> Aug 21, 2024  3:58 PM [name] wrote: Red Word that prompted transfer to Nurse Triage: Pt's son, [name] is reporting that pt's right and left are swollen, he had diarrhea for 2-3 days and has been experiencing difficulty breathing. Reason for Disposition  [1] MILD difficulty breathing (e.g., minimal/no SOB at rest, SOB with walking, pulse < 100) AND [2] NEW-onset or WORSE than normal  Answer Assessment - Initial Assessment Questions Patient's son is calling with report from caregiver regarding patient status today- hard to triage- caregiver is not available and patient's son is not with patient- is going to see him now. Advised per what he could tell me and he has been given the reasons he would call 911/ED for patient. An acute appointment has been scheduled for tomorrow for evaluation of changes in status- but patient may go to ED if symptoms are worse than reported.     1. RESPIRATORY STATUS: Describe your breathing? (e.g., wheezing, shortness of breath, unable to speak, severe coughing)      Caregiver reports episodes of SOB today with patient 2. ONSET: When did this breathing problem begin?      today 3. PATTERN Does the difficult breathing come and go, or has it been constant since it started?      Comes and goes 4. SEVERITY: How bad is your breathing? (e.g., mild, moderate, severe)      Not sure-  mild/moderate 5. RECURRENT SYMPTOM: Have you had difficulty breathing before? If Yes, ask: When was the last time? and What happened that time?      Yes- hx heart disease- PAF,  TAVR 6. CARDIAC HISTORY: Do you have any history of heart disease? (e.g., heart attack, angina, bypass surgery, angioplasty)      See above 7. LUNG HISTORY: Do you have any history of lung disease?  (e.g., pulmonary embolus, asthma, emphysema)     no 8. CAUSE: What do you think is causing the breathing problem?      Not sure- patient has hx heart disease that has caused SOB in past 9. OTHER SYMPTOMS: Do you have any other symptoms? (e.g., chest pain, cough, dizziness, fever, runny nose)     Swelling of legs- R worse than left, diarrhea 10. O2 SATURATION MONITOR:  Do you use an oxygen saturation monitor (pulse oximeter) at home? If Yes, ask: What is your reading (oxygen level) today? What is your usual oxygen saturation reading? (e.g., 95%)       unknown  Protocols used: Breathing Difficulty-A-AH

## 2024-08-22 ENCOUNTER — Encounter: Payer: Self-pay | Admitting: Family Medicine

## 2024-08-22 ENCOUNTER — Ambulatory Visit (INDEPENDENT_AMBULATORY_CARE_PROVIDER_SITE_OTHER)

## 2024-08-22 ENCOUNTER — Ambulatory Visit: Payer: Self-pay | Admitting: Family Medicine

## 2024-08-22 ENCOUNTER — Ambulatory Visit: Admitting: Family Medicine

## 2024-08-22 VITALS — BP 138/82 | HR 77 | Temp 98.2°F | Ht 66.0 in | Wt 197.0 lb

## 2024-08-22 DIAGNOSIS — M545 Low back pain, unspecified: Secondary | ICD-10-CM | POA: Diagnosis not present

## 2024-08-22 DIAGNOSIS — R0600 Dyspnea, unspecified: Secondary | ICD-10-CM

## 2024-08-22 DIAGNOSIS — R197 Diarrhea, unspecified: Secondary | ICD-10-CM

## 2024-08-22 DIAGNOSIS — R600 Localized edema: Secondary | ICD-10-CM | POA: Diagnosis not present

## 2024-08-22 LAB — POC URINALSYSI DIPSTICK (AUTOMATED)
Bilirubin, UA: NEGATIVE
Glucose, UA: NEGATIVE
Ketones, UA: NEGATIVE
Leukocytes, UA: NEGATIVE
Nitrite, UA: NEGATIVE
Protein, UA: NEGATIVE
Spec Grav, UA: 1.015
Urobilinogen, UA: 0.2 U/dL
pH, UA: 5.5

## 2024-08-22 LAB — CBC WITH DIFFERENTIAL/PLATELET
Basophils Absolute: 0.1 K/uL (ref 0.0–0.1)
Basophils Relative: 0.6 % (ref 0.0–3.0)
Eosinophils Absolute: 0.4 K/uL (ref 0.0–0.7)
Eosinophils Relative: 3.2 % (ref 0.0–5.0)
HCT: 33.5 % — ABNORMAL LOW (ref 39.0–52.0)
Hemoglobin: 11.3 g/dL — ABNORMAL LOW (ref 13.0–17.0)
Lymphocytes Relative: 11.2 % — ABNORMAL LOW (ref 12.0–46.0)
Lymphs Abs: 1.3 K/uL (ref 0.7–4.0)
MCHC: 33.7 g/dL (ref 30.0–36.0)
MCV: 101.8 fl — ABNORMAL HIGH (ref 78.0–100.0)
Monocytes Absolute: 1.4 K/uL — ABNORMAL HIGH (ref 0.1–1.0)
Monocytes Relative: 11.5 % (ref 3.0–12.0)
Neutro Abs: 8.7 K/uL — ABNORMAL HIGH (ref 1.4–7.7)
Neutrophils Relative %: 73.5 % (ref 43.0–77.0)
Platelets: 125 K/uL — ABNORMAL LOW (ref 150.0–400.0)
RBC: 3.29 Mil/uL — ABNORMAL LOW (ref 4.22–5.81)
RDW: 12.9 % (ref 11.5–15.5)
WBC: 11.8 K/uL — ABNORMAL HIGH (ref 4.0–10.5)

## 2024-08-22 LAB — BRAIN NATRIURETIC PEPTIDE: Pro B Natriuretic peptide (BNP): 319 pg/mL — ABNORMAL HIGH (ref 1.0–100.0)

## 2024-08-22 LAB — COMPREHENSIVE METABOLIC PANEL WITH GFR
ALT: 10 U/L (ref 3–53)
AST: 22 U/L (ref 5–37)
Albumin: 3.7 g/dL (ref 3.5–5.2)
Alkaline Phosphatase: 69 U/L (ref 39–117)
BUN: 41 mg/dL — ABNORMAL HIGH (ref 6–23)
CO2: 22 meq/L (ref 19–32)
Calcium: 8.5 mg/dL (ref 8.4–10.5)
Chloride: 107 meq/L (ref 96–112)
Creatinine, Ser: 2.9 mg/dL — ABNORMAL HIGH (ref 0.40–1.50)
GFR: 18.27 mL/min — ABNORMAL LOW
Glucose, Bld: 110 mg/dL — ABNORMAL HIGH (ref 70–99)
Potassium: 4.2 meq/L (ref 3.5–5.1)
Sodium: 139 meq/L (ref 135–145)
Total Bilirubin: 0.4 mg/dL (ref 0.2–1.2)
Total Protein: 7.1 g/dL (ref 6.0–8.3)

## 2024-08-22 NOTE — Progress Notes (Addendum)
 "  Acute Office Visit   Subjective:  Patient ID: Mark Jackson., male    DOB: [birth year], 89 y.o.   MRN: [id number]  Chief Complaint  Patient presents with   Diarrhea   Back Pain   Shortness of Breath   Leg Swelling   HPI:  [name] presents to the clinic accompanied by his son. He comes in due to symptoms of diarrhea, right lower back pain, SHOB, and bilateral leg swelling. Symptoms were noticed on Saturday. He has a care taker that noticed lower extremity swelling yesterday. He has a history of CAD, AFIB rate controlled, HTN with diastolic CHF, and CKD. Has current Cardiologist Dr. Anner. His current medication regimen includes Lasix  40mg  tablets BID and PRN for edema. Son and patient are not sure if the patient is currently taking Lasix  as prescribed.    Bilateral lower extremity edema: Patient is not currently monitoring daily weights. Typically swelling is more prominent on the right lower extremity. Denies any CP. Patient reports that his leg swelling is mild in comparison to prior exacerbations.   Increased back pain: Denies any recent injury to back. Back pain is exacerbated with movement. Described as a sharp pain that does not radiate. Described as increased since Saturday. Has had this pain before, several years ago. This pain has come on gradually. Patient takes 1- 2 Tylenol  500mg  tablets per day for back pain.  SHOB: Patient describes that he has had SHOB for years, but a little more than usual.   Diarrhea: 2- 3 days noticed. Denies any changes in urinary habits. Says that diarrhea is intermittent and somewhat baseline for years now.   Review of Systems  Constitutional: Negative.   HENT: Negative.    Eyes: Negative.   Respiratory:  Positive for shortness of breath.   Cardiovascular: Negative.   Gastrointestinal:  Positive for diarrhea.  Genitourinary: Negative.   Musculoskeletal:  Positive for back pain.  Skin: Negative.   Neurological: Negative.    Psychiatric/Behavioral: Negative.       Objective:    BP 138/82   Pulse 77   Temp 98.2 F (36.8 C) (Oral)   Ht 5' 6 (1.676 m)   Wt 197 lb (89.4 kg)   SpO2 99%   BMI 31.80 kg/m  BP Readings from Last 3 Encounters:  08/22/24 138/82  05/03/24 (!) 148/82  03/19/24 (!) 158/68   Wt Readings from Last 3 Encounters:  08/22/24 197 lb (89.4 kg)  05/03/24 192 lb (87.1 kg)  03/13/24 195 lb (88.5 kg)   Physical Exam Constitutional:      Appearance: Normal appearance.  Cardiovascular:     Rate and Rhythm: Normal rate and regular rhythm.     Heart sounds: Normal heart sounds.  Pulmonary:     Effort: Pulmonary effort is normal.     Breath sounds: Normal breath sounds.  Musculoskeletal:     Lumbar back: No tenderness.     Right lower leg: Swelling present. No tenderness. 2+ Pitting Edema present.     Left lower leg: Swelling present. No tenderness. 2+ Pitting Edema present.  Skin:    General: Skin is warm and dry.  Neurological:     General: No focal deficit present.     Mental Status: He is alert and oriented to person, place, and time. Mental status is at baseline.     Gait: Gait abnormal (Wheelchair).  Psychiatric:        Mood and Affect: Mood normal.  Behavior: Behavior normal.        Thought Content: Thought content normal.    Results for orders placed or performed in visit on 08/22/24  CBC with Differential/Platelet  Result Value Ref Range   WBC 11.8 (H) 4.0 - 10.5 K/uL   RBC 3.29 (L) 4.22 - 5.81 Mil/uL   Hemoglobin 11.3 (L) 13.0 - 17.0 g/dL   HCT 66.4 (L) 60.9 - 47.9 %   MCV 101.8 (H) 78.0 - 100.0 fl   MCHC 33.7 30.0 - 36.0 g/dL   RDW 87.0 88.4 - 84.4 %   Platelets 125.0 (L) 150.0 - 400.0 K/uL   Neutrophils Relative % 73.5 43.0 - 77.0 %   Lymphocytes Relative 11.2 (L) 12.0 - 46.0 %   Monocytes Relative 11.5 3.0 - 12.0 %   Eosinophils Relative 3.2 0.0 - 5.0 %   Basophils Relative 0.6 0.0 - 3.0 %   Neutro Abs 8.7 (H) 1.4 - 7.7 K/uL   Lymphs Abs 1.3 0.7 -  4.0 K/uL   Monocytes Absolute 1.4 (H) 0.1 - 1.0 K/uL   Eosinophils Absolute 0.4 0.0 - 0.7 K/uL   Basophils Absolute 0.1 0.0 - 0.1 K/uL  Comprehensive metabolic panel with GFR  Result Value Ref Range   Sodium 139 135 - 145 mEq/L   Potassium 4.2 3.5 - 5.1 mEq/L   Chloride 107 96 - 112 mEq/L   CO2 22 19 - 32 mEq/L   Glucose, Bld 110 (H) 70 - 99 mg/dL   BUN 41 (H) 6 - 23 mg/dL   Creatinine, Ser 7.09 (H) 0.40 - 1.50 mg/dL   Total Bilirubin 0.4 0.2 - 1.2 mg/dL   Alkaline Phosphatase 69 39 - 117 U/L   AST 22 5 - 37 U/L   ALT 10 3 - 53 U/L   Total Protein 7.1 6.0 - 8.3 g/dL   Albumin 3.7 3.5 - 5.2 g/dL   GFR 81.72 (L) >39.99 mL/min   Calcium  8.5 8.4 - 10.5 mg/dL  Brain natriuretic peptide  Result Value Ref Range   Pro B Natriuretic peptide (BNP) 319.0 (H) 1.0 - 100.0 pg/mL  POCT Urinalysis Dipstick (Automated)  Result Value Ref Range   Color, UA yellow    Clarity, UA clear    Glucose, UA Negative Negative   Bilirubin, UA negative    Ketones, UA negative    Spec Grav, UA 1.015 1.010 - 1.025   pH, UA 5.5 5.0 - 8.0   Protein, UA Negative Negative   Urobilinogen, UA 0.2 0.2 or 1.0 E.U./dL   Nitrite, UA negative    Leukocytes, UA Negative Negative      Assessment & Plan:  Bilateral lower extremity edema Assessment & Plan: Edema likely due to venous insufficiency.  -Encourage leg elevation. -Take Lasix  as prescribed. -Encouraged to monitor daily weights.  Orders: -     CBC with Differential/Platelet -     Comprehensive metabolic panel with GFR -     Brain natriuretic peptide  Dyspnea, unspecified type Assessment & Plan: Multifactorial, continue to take Lasix  as prescribed  Orders: -     DG Chest 2 View; Future -     CBC with Differential/Platelet -     Comprehensive metabolic panel with GFR -     Brain natriuretic peptide  Acute left-sided low back pain without sciatica -     POCT Urinalysis Dipstick (Automated)  Diarrhea, unspecified type   -Examined and  discussed potential reasons for acute symptoms. -Ordered Chest X-Ray. Provider will follow up  via MyChart with results and call with results.  -Ordered labs: CBC, CMP, BNP, urine sample. Provider will follow up via MyChart with results and call with results.  -Urinalysis completed with no acute findings.  -Please record and monitor daily weights. This is very important to monitor, especially while on Lasix  medication.Follow up if weight gain more than 2 IBS in 24 hours and 5 IBS in a week.  -OTC Immodium can be helpful with diarrhea. -Can increase Tylenol  500 mg tablets, 1-2 tablets every 8 hours as needed for back pain. Can apply heat to the area for relief, 4-6 times a day, up to 20 minutes at a time. -If symptoms worsen (increased bilateral leg swelling) or having any chest pain, increased shortness of breath, please go to the nearest emergency room. -Recommend to follow up with  Primary Care Provider (Dr. Katrinka) if symptoms continue or become worse.   I personally was present during the history, physical exam, and medical decision-making activities of this service and have verified that the service and findings are accurately documented in the nurse practitioner student's note.  JoAnna Williamson, NP  JoAnna Williamson, NP  "

## 2024-08-22 NOTE — Assessment & Plan Note (Signed)
 Multifactorial, continue to take Lasix  as prescribed

## 2024-08-22 NOTE — Assessment & Plan Note (Signed)
 Edema likely due to venous insufficiency.  -Encourage leg elevation. -Take Lasix  as prescribed. -Encouraged to monitor daily weights.

## 2024-08-22 NOTE — Patient Instructions (Addendum)
 It was nice to see you today Today we: -Examined and discussed potential reasons for acute symptoms. -Ordered Chest X-Ray. Provider will follow up via MyChart with results and call with results.  -Ordered labs.. Provider will follow up via MyChart with result and will call with results.  -Urinalysis completed in office with no acute findings.  -Please record and monitor daily weights. This is very important to monitor, especially while on Lasix  medication. Follow up if weight gain more than 2 IBS in 24 hours and 5 IBS in a week.  -Over-the-Counter Immodium can be helpful with diarrhea. -Can increase Tylenol  500mg  tablets, 1-2 tablets every 8 hours as needed for back pain. Can apply heat to the area for relief, 4-6 times a day, up to 20 minutes at a time.  -If symptoms worsen (increased bilateral leg swelling) or having any chest pain, increased shortness of breath, please go to the nearest emergency room. -Recommend to follow up with  Primary Care Provider if symptoms continue or become worse.

## 2024-08-24 ENCOUNTER — Ambulatory Visit: Payer: Self-pay

## 2024-08-24 ENCOUNTER — Encounter: Payer: Self-pay | Admitting: Cardiology

## 2024-08-24 ENCOUNTER — Ambulatory Visit: Attending: Cardiology | Admitting: Cardiology

## 2024-08-24 VITALS — BP 120/86 | HR 84 | Ht 66.0 in | Wt 195.0 lb

## 2024-08-24 DIAGNOSIS — I10 Essential (primary) hypertension: Secondary | ICD-10-CM | POA: Insufficient documentation

## 2024-08-24 DIAGNOSIS — I480 Paroxysmal atrial fibrillation: Secondary | ICD-10-CM | POA: Diagnosis not present

## 2024-08-24 DIAGNOSIS — D6869 Other thrombophilia: Secondary | ICD-10-CM | POA: Diagnosis not present

## 2024-08-24 DIAGNOSIS — I455 Other specified heart block: Secondary | ICD-10-CM | POA: Insufficient documentation

## 2024-08-24 LAB — CUP PACEART REMOTE DEVICE CHECK
Date Time Interrogation Session: 20260114232806
Implantable Pulse Generator Implant Date: 20240530

## 2024-08-24 NOTE — Progress Notes (Signed)
 "  Electrophysiology Office Note:   Date:  08/24/2024  ID:  Mark Jackson., DOB [birth year], MRN [id number]  Primary Cardiologist: Alm Clay, MD Primary Heart Failure: None Electrophysiologist: OLE ONEIDA HOLTS, MD (Inactive)      History of Present Illness:   Mark Ericsson. is a 89 y.o. male with h/o atrial fibrillation, sick sinus syndrome seen today for routine electrophysiology followup.   Discussed the use of AI scribe software for clinical note transcription with the patient, who gave verbal consent to proceed.  History of Present Illness Mark Haymer. is a 89 year old male with atrial fibrillation who presents for a follow-up on his heart rhythm management.  He has a history of atrial fibrillation but has not experienced any significant episodes recently. His loop recorder has not detected any atrial fibrillation over the past month. His son expressed concerns about a past incident where his heart reportedly stopped for a brief period during sleep, which was not immediately communicated to him. This event occurred some time ago, and there have been no recent similar incidents according to the loop recorder data.  He mentions experiencing hip pain since Monday morning, but he is unsure of the cause.  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 He is currently reliant on others for transportation, which he finds challenging.   he denies chest pain, palpitations, dyspnea, PND, orthopnea, nausea, vomiting, dizziness, syncope, edema, weight gain, or early satiety.   Review of systems complete and found to be negative unless listed in HPI.   Device History: Medtronic loop recorder implanted  for Atrial fibrillation  EP Information / Studies Reviewed:    EKG is ordered today. Personal review as below.  EKG Interpretation Date/Time:  Thursday August 24 2024  12:11:08 EST Ventricular Rate:  84 PR Interval:  238 QRS Duration:  88 QT Interval:  372 QTC Calculation: 439 R Axis:   -32  Text Interpretation: Sinus rhythm with 1st degree A-V block Left axis deviation Moderate voltage criteria for LVH, may be normal variant ( R in aVL , Cornell product ) Inferior infarct , age undetermined Anterior infarct (cited on or before 13-Mar-2024) T wave abnormality, consider lateral ischemia When compared with ECG of 13-Mar-2024 11:56, No significant change was found Confirmed by Mark Jackson ([id number]) on 08/24/2024 12:17:48 PM     Risk Assessment/Calculations:    CHA2DS2-VASc Score = 4   This indicates a 4.8% annual risk of stroke. The patient's score is based upon: CHF History: 0 HTN History: 1 Diabetes History: 0 Stroke History: 0 Vascular Disease History: 1 Age Score: 2 Gender Score: 0       STOP-Bang Score:          Physical Exam:   VS:  BP 120/86 (BP Location: Right Arm, Patient Position: Sitting, Cuff Size: Normal)   Pulse 84   Ht 5' 6 (1.676 m)   Wt 195 lb (88.5 kg)   SpO2 98%   BMI 31.47 kg/m    Wt Readings from Last 3 Encounters:  08/24/24 195 lb (88.5 kg)  08/22/24 197 lb (89.4 kg)  05/03/24 192 lb (87.1 kg)     GEN: Well nourished, well developed in no acute distress NECK: No JVD; No carotid bruits CARDIAC: Regular rate and rhythm, no murmurs, rubs, gallops RESPIRATORY:  Clear to auscultation without rales, wheezing or rhonchi  ABDOMEN: Soft, non-tender, non-distended EXTREMITIES:  No edema; No deformity   ILR Interrogation- reviewed in detail today,  See PACEART report  ASSESSMENT AND PLAN:    Atrial fibrillation s/p Medtronic Loop recorder Normal device function See Pace Art report No changes today  2.  Hypertension: Well-controlled  3.  Coronary artery disease: No current chest pain.  Plan per primary cardiology.     Follow up with EP Team in 12 months  Signed, Montrice Montuori Gladis Norton, MD  "

## 2024-09-01 NOTE — Progress Notes (Signed)
 Remote Loop Recorder Transmission

## 2024-09-12 ENCOUNTER — Other Ambulatory Visit: Payer: Self-pay | Admitting: Family Medicine

## 2024-11-01 ENCOUNTER — Ambulatory Visit: Admitting: Family Medicine

## 2024-12-11 ENCOUNTER — Ambulatory Visit
